# Patient Record
Sex: Female | Born: 1965 | Race: White | Hispanic: No | Marital: Married | Smoking: Former smoker
Health system: Southern US, Community
[De-identification: ages and names within clinical notes are randomized; demographics above are authoritative.]

## PROBLEM LIST (undated history)

## (undated) ENCOUNTER — Emergency Department (HOSPITAL_BASED_OUTPATIENT_CLINIC_OR_DEPARTMENT_OTHER): Discharge status: Absent without leave | Payer: BC Managed Care – PPO | Source: Home / Self Care

## (undated) DIAGNOSIS — G9332 Myalgic encephalomyelitis/chronic fatigue syndrome: Secondary | ICD-10-CM

## (undated) DIAGNOSIS — I341 Nonrheumatic mitral (valve) prolapse: Secondary | ICD-10-CM

## (undated) DIAGNOSIS — M1990 Unspecified osteoarthritis, unspecified site: Secondary | ICD-10-CM

## (undated) DIAGNOSIS — K648 Other hemorrhoids: Secondary | ICD-10-CM

## (undated) DIAGNOSIS — D219 Benign neoplasm of connective and other soft tissue, unspecified: Secondary | ICD-10-CM

## (undated) DIAGNOSIS — M2550 Pain in unspecified joint: Secondary | ICD-10-CM

## (undated) DIAGNOSIS — S12500A Unspecified displaced fracture of sixth cervical vertebra, initial encounter for closed fracture: Secondary | ICD-10-CM

## (undated) DIAGNOSIS — S064X9A Epidural hemorrhage with loss of consciousness of unspecified duration, initial encounter: Secondary | ICD-10-CM

## (undated) DIAGNOSIS — N809 Endometriosis, unspecified: Secondary | ICD-10-CM

## (undated) DIAGNOSIS — E538 Deficiency of other specified B group vitamins: Secondary | ICD-10-CM

## (undated) DIAGNOSIS — S22000A Wedge compression fracture of unspecified thoracic vertebra, initial encounter for closed fracture: Secondary | ICD-10-CM

## (undated) DIAGNOSIS — K219 Gastro-esophageal reflux disease without esophagitis: Secondary | ICD-10-CM

## (undated) DIAGNOSIS — L719 Rosacea, unspecified: Secondary | ICD-10-CM

## (undated) DIAGNOSIS — N890 Mild vaginal dysplasia: Secondary | ICD-10-CM

## (undated) DIAGNOSIS — Z8742 Personal history of other diseases of the female genital tract: Secondary | ICD-10-CM

## (undated) DIAGNOSIS — C44602 Unspecified malignant neoplasm of skin of right upper limb, including shoulder: Secondary | ICD-10-CM

## (undated) DIAGNOSIS — R5382 Chronic fatigue, unspecified: Secondary | ICD-10-CM

## (undated) DIAGNOSIS — R011 Cardiac murmur, unspecified: Secondary | ICD-10-CM

## (undated) DIAGNOSIS — L500 Allergic urticaria: Secondary | ICD-10-CM

## (undated) DIAGNOSIS — K589 Irritable bowel syndrome without diarrhea: Secondary | ICD-10-CM

## (undated) DIAGNOSIS — M797 Fibromyalgia: Secondary | ICD-10-CM

## (undated) DIAGNOSIS — I4891 Unspecified atrial fibrillation: Secondary | ICD-10-CM

## (undated) DIAGNOSIS — K2970 Gastritis, unspecified, without bleeding: Secondary | ICD-10-CM

## (undated) HISTORY — DX: Gastritis, unspecified, without bleeding: K29.70

## (undated) HISTORY — PX: ABDOMINAL HYSTERECTOMY: SHX81

## (undated) HISTORY — DX: Allergic urticaria: L50.0

## (undated) HISTORY — DX: Personal history of other diseases of the female genital tract: Z87.42

## (undated) HISTORY — DX: Fibromyalgia: M79.7

## (undated) HISTORY — DX: Nonrheumatic mitral (valve) prolapse: I34.1

## (undated) HISTORY — DX: Wedge compression fracture of unspecified thoracic vertebra, initial encounter for closed fracture: S22.000A

## (undated) HISTORY — PX: OVARIAN CYST REMOVAL: SHX89

## (undated) HISTORY — DX: Unspecified malignant neoplasm of skin of right upper limb, including shoulder: C44.602

## (undated) HISTORY — DX: Gastro-esophageal reflux disease without esophagitis: K21.9

## (undated) HISTORY — DX: Rosacea, unspecified: L71.9

## (undated) HISTORY — DX: Endometriosis, unspecified: N80.9

## (undated) HISTORY — DX: Pain in unspecified joint: M25.50

## (undated) HISTORY — DX: Myalgic encephalomyelitis/chronic fatigue syndrome: G93.32

## (undated) HISTORY — DX: Unspecified displaced fracture of sixth cervical vertebra, initial encounter for closed fracture: S12.500A

## (undated) HISTORY — PX: UTERINE FIBROID SURGERY: SHX826

## (undated) HISTORY — DX: Cardiac murmur, unspecified: R01.1

## (undated) HISTORY — DX: Mild vaginal dysplasia: N89.0

## (undated) HISTORY — PX: ANAL FISSURE REPAIR: SHX2312

## (undated) HISTORY — PX: COLPOSCOPY: SHX161

## (undated) HISTORY — PX: OTHER SURGICAL HISTORY: SHX169

## (undated) HISTORY — PX: TUBAL LIGATION: SHX77

## (undated) HISTORY — DX: Other hemorrhoids: K64.8

## (undated) HISTORY — DX: Benign neoplasm of connective and other soft tissue, unspecified: D21.9

## (undated) HISTORY — PX: HEMORRHOID SURGERY: SHX153

## (undated) HISTORY — DX: Chronic fatigue, unspecified: R53.82

## (undated) HISTORY — PX: TONSILLECTOMY AND ADENOIDECTOMY: SUR1326

## (undated) HISTORY — DX: Epidural hemorrhage with loss of consciousness of unspecified duration, initial encounter: S06.4X9A

## (undated) HISTORY — DX: Deficiency of other specified B group vitamins: E53.8

## (undated) HISTORY — PX: APPENDECTOMY: SHX54

## (undated) HISTORY — DX: Unspecified osteoarthritis, unspecified site: M19.90

---

## 1998-12-30 ENCOUNTER — Ambulatory Visit (HOSPITAL_COMMUNITY): Admission: RE | Admit: 1998-12-30 | Discharge: 1998-12-30 | Payer: Self-pay | Admitting: Internal Medicine

## 1998-12-30 ENCOUNTER — Encounter: Payer: Self-pay | Admitting: Internal Medicine

## 1999-01-17 ENCOUNTER — Encounter: Admission: RE | Admit: 1999-01-17 | Discharge: 1999-02-11 | Payer: Self-pay | Admitting: Internal Medicine

## 1999-01-20 ENCOUNTER — Encounter: Payer: Self-pay | Admitting: Internal Medicine

## 1999-01-20 ENCOUNTER — Ambulatory Visit (HOSPITAL_COMMUNITY): Admission: RE | Admit: 1999-01-20 | Discharge: 1999-01-20 | Payer: Self-pay | Admitting: Internal Medicine

## 1999-04-02 ENCOUNTER — Emergency Department (HOSPITAL_COMMUNITY): Admission: EM | Admit: 1999-04-02 | Discharge: 1999-04-03 | Payer: Self-pay | Admitting: Emergency Medicine

## 1999-12-12 ENCOUNTER — Encounter: Payer: Self-pay | Admitting: Internal Medicine

## 1999-12-12 ENCOUNTER — Ambulatory Visit (HOSPITAL_COMMUNITY): Admission: RE | Admit: 1999-12-12 | Discharge: 1999-12-12 | Payer: Self-pay | Admitting: Internal Medicine

## 2000-06-29 ENCOUNTER — Encounter: Payer: Self-pay | Admitting: Obstetrics and Gynecology

## 2000-06-29 ENCOUNTER — Encounter: Admission: RE | Admit: 2000-06-29 | Discharge: 2000-06-29 | Payer: Self-pay | Admitting: Obstetrics and Gynecology

## 2000-09-15 ENCOUNTER — Ambulatory Visit (HOSPITAL_COMMUNITY): Admission: RE | Admit: 2000-09-15 | Discharge: 2000-09-15 | Payer: Self-pay | Admitting: Obstetrics and Gynecology

## 2000-09-15 ENCOUNTER — Encounter: Payer: Self-pay | Admitting: Obstetrics and Gynecology

## 2000-09-16 ENCOUNTER — Other Ambulatory Visit: Admission: RE | Admit: 2000-09-16 | Discharge: 2000-09-16 | Payer: Self-pay | Admitting: Internal Medicine

## 2000-09-16 ENCOUNTER — Encounter (INDEPENDENT_AMBULATORY_CARE_PROVIDER_SITE_OTHER): Payer: Self-pay | Admitting: Specialist

## 2001-06-20 ENCOUNTER — Encounter: Payer: Self-pay | Admitting: Family Medicine

## 2001-06-20 ENCOUNTER — Ambulatory Visit (HOSPITAL_COMMUNITY): Admission: RE | Admit: 2001-06-20 | Discharge: 2001-06-20 | Payer: Self-pay | Admitting: Family Medicine

## 2001-10-31 ENCOUNTER — Encounter: Admission: RE | Admit: 2001-10-31 | Discharge: 2002-01-29 | Payer: Self-pay | Admitting: Internal Medicine

## 2002-10-05 ENCOUNTER — Encounter: Payer: Self-pay | Admitting: Family Medicine

## 2002-10-05 ENCOUNTER — Ambulatory Visit (HOSPITAL_COMMUNITY): Admission: RE | Admit: 2002-10-05 | Discharge: 2002-10-05 | Payer: Self-pay | Admitting: Family Medicine

## 2003-02-20 ENCOUNTER — Other Ambulatory Visit: Admission: RE | Admit: 2003-02-20 | Discharge: 2003-02-20 | Payer: Self-pay | Admitting: Gynecology

## 2003-03-27 ENCOUNTER — Ambulatory Visit (HOSPITAL_COMMUNITY): Admission: RE | Admit: 2003-03-27 | Discharge: 2003-03-27 | Payer: Self-pay | Admitting: Family Medicine

## 2003-03-28 ENCOUNTER — Ambulatory Visit (HOSPITAL_COMMUNITY): Admission: RE | Admit: 2003-03-28 | Discharge: 2003-03-28 | Payer: Self-pay | Admitting: Family Medicine

## 2003-04-23 ENCOUNTER — Ambulatory Visit (HOSPITAL_COMMUNITY): Admission: RE | Admit: 2003-04-23 | Discharge: 2003-04-23 | Payer: Self-pay | Admitting: Internal Medicine

## 2003-10-11 ENCOUNTER — Encounter (INDEPENDENT_AMBULATORY_CARE_PROVIDER_SITE_OTHER): Payer: Self-pay | Admitting: Specialist

## 2003-10-11 ENCOUNTER — Observation Stay (HOSPITAL_COMMUNITY): Admission: RE | Admit: 2003-10-11 | Discharge: 2003-10-12 | Payer: Self-pay | Admitting: Gynecology

## 2003-10-17 ENCOUNTER — Emergency Department (HOSPITAL_COMMUNITY): Admission: EM | Admit: 2003-10-17 | Discharge: 2003-10-18 | Payer: Self-pay | Admitting: Emergency Medicine

## 2004-04-21 ENCOUNTER — Ambulatory Visit: Payer: Self-pay | Admitting: Internal Medicine

## 2005-05-21 ENCOUNTER — Ambulatory Visit: Payer: Self-pay | Admitting: Internal Medicine

## 2005-07-07 ENCOUNTER — Other Ambulatory Visit: Admission: RE | Admit: 2005-07-07 | Discharge: 2005-07-07 | Payer: Self-pay | Admitting: Family Medicine

## 2006-04-21 ENCOUNTER — Ambulatory Visit: Payer: Self-pay | Admitting: Internal Medicine

## 2006-04-21 LAB — CONVERTED CEMR LAB
ALT: 10 units/L (ref 0–40)
AST: 16 units/L (ref 0–37)
Albumin: 3.8 g/dL (ref 3.5–5.2)
BUN: 11 mg/dL (ref 6–23)
Basophils Absolute: 0 10*3/uL (ref 0.0–0.1)
CO2: 30 meq/L (ref 19–32)
Chloride: 102 meq/L (ref 96–112)
Creatinine, Ser: 0.6 mg/dL (ref 0.4–1.2)
Eosinophils Relative: 0.1 % (ref 0.0–5.0)
GFR calc Af Amer: 142 mL/min
GFR calc non Af Amer: 118 mL/min
HCT: 38.7 % (ref 36.0–46.0)
Ketones, ur: NEGATIVE mg/dL
MCHC: 34.7 g/dL (ref 30.0–36.0)
MCV: 86.7 fL (ref 78.0–100.0)
Monocytes Absolute: 0.6 10*3/uL (ref 0.2–0.7)
Monocytes Relative: 8.2 % (ref 3.0–11.0)
Potassium: 3.9 meq/L (ref 3.5–5.1)
RBC: 4.47 M/uL (ref 3.87–5.11)
RDW: 11.9 % (ref 11.5–14.6)
Sed Rate: 16 mm/hr (ref 0–25)
Sodium: 136 meq/L (ref 135–145)
Specific Gravity, Urine: 1.01 (ref 1.000–1.03)
Urobilinogen, UA: 0.2 (ref 0.0–1.0)
WBC: 7.7 10*3/uL (ref 4.5–10.5)
pH: 7.5 (ref 5.0–8.0)

## 2006-11-16 ENCOUNTER — Other Ambulatory Visit: Admission: RE | Admit: 2006-11-16 | Discharge: 2006-11-16 | Payer: Self-pay | Admitting: Family Medicine

## 2007-11-04 ENCOUNTER — Other Ambulatory Visit: Admission: RE | Admit: 2007-11-04 | Discharge: 2007-11-04 | Payer: Self-pay | Admitting: Gynecology

## 2008-01-02 ENCOUNTER — Ambulatory Visit: Payer: Self-pay | Admitting: Internal Medicine

## 2008-01-02 DIAGNOSIS — J019 Acute sinusitis, unspecified: Secondary | ICD-10-CM

## 2008-01-02 DIAGNOSIS — E538 Deficiency of other specified B group vitamins: Secondary | ICD-10-CM

## 2008-01-02 DIAGNOSIS — L049 Acute lymphadenitis, unspecified: Secondary | ICD-10-CM

## 2008-01-02 DIAGNOSIS — Z8709 Personal history of other diseases of the respiratory system: Secondary | ICD-10-CM | POA: Insufficient documentation

## 2008-01-03 LAB — CONVERTED CEMR LAB
Eosinophils Absolute: 0 10*3/uL (ref 0.0–0.7)
Hemoglobin: 12.9 g/dL (ref 12.0–15.0)
MCV: 90.4 fL (ref 78.0–100.0)
Monocytes Absolute: 0.5 10*3/uL (ref 0.1–1.0)
Neutrophils Relative %: 64.9 % (ref 43.0–77.0)
Platelets: 325 10*3/uL (ref 150–400)
RBC: 4.19 M/uL (ref 3.87–5.11)
TSH: 0.95 microintl units/mL (ref 0.35–5.50)
Vitamin B-12: 498 pg/mL (ref 211–911)

## 2008-01-18 ENCOUNTER — Encounter: Admission: RE | Admit: 2008-01-18 | Discharge: 2008-01-18 | Payer: Self-pay | Admitting: Dermatology

## 2008-01-20 ENCOUNTER — Encounter: Payer: Self-pay | Admitting: Internal Medicine

## 2008-02-27 ENCOUNTER — Encounter: Admission: RE | Admit: 2008-02-27 | Discharge: 2008-02-27 | Payer: Self-pay | Admitting: Gynecology

## 2008-04-12 ENCOUNTER — Ambulatory Visit (HOSPITAL_BASED_OUTPATIENT_CLINIC_OR_DEPARTMENT_OTHER): Admission: RE | Admit: 2008-04-12 | Discharge: 2008-04-12 | Payer: Self-pay | Admitting: General Surgery

## 2008-04-12 ENCOUNTER — Encounter (INDEPENDENT_AMBULATORY_CARE_PROVIDER_SITE_OTHER): Payer: Self-pay | Admitting: General Surgery

## 2008-12-27 ENCOUNTER — Ambulatory Visit: Payer: Self-pay | Admitting: Internal Medicine

## 2008-12-27 DIAGNOSIS — L299 Pruritus, unspecified: Secondary | ICD-10-CM | POA: Insufficient documentation

## 2008-12-27 DIAGNOSIS — L500 Allergic urticaria: Secondary | ICD-10-CM | POA: Insufficient documentation

## 2008-12-27 DIAGNOSIS — L719 Rosacea, unspecified: Secondary | ICD-10-CM

## 2008-12-28 LAB — CONVERTED CEMR LAB
Cortisol, Plasma: 7.5 ug/dL
Hemoglobin, Urine: NEGATIVE
Ketones, ur: NEGATIVE mg/dL
Nitrite: NEGATIVE
Specific Gravity, Urine: <=1.005 (ref 1.000–1.030)
Total Protein, Urine: NEGATIVE mg/dL
Urine Glucose: NEGATIVE mg/dL
Urobilinogen, UA: 0.2 (ref 0.0–1.0)
pH: 7 (ref 5.0–8.0)

## 2009-02-20 ENCOUNTER — Ambulatory Visit: Payer: Self-pay | Admitting: Internal Medicine

## 2009-02-20 DIAGNOSIS — M545 Low back pain, unspecified: Secondary | ICD-10-CM | POA: Insufficient documentation

## 2009-02-20 DIAGNOSIS — R109 Unspecified abdominal pain: Secondary | ICD-10-CM | POA: Insufficient documentation

## 2009-02-20 LAB — CONVERTED CEMR LAB
Bilirubin Urine: NEGATIVE
Hemoglobin, Urine: NEGATIVE
Leukocytes, UA: NEGATIVE
Specific Gravity, Urine: <=1.005 (ref 1.000–1.030)

## 2009-06-26 ENCOUNTER — Ambulatory Visit: Payer: Self-pay | Admitting: Internal Medicine

## 2009-06-26 DIAGNOSIS — Z87891 Personal history of nicotine dependence: Secondary | ICD-10-CM | POA: Insufficient documentation

## 2009-06-26 DIAGNOSIS — R51 Headache: Secondary | ICD-10-CM

## 2009-06-26 DIAGNOSIS — M2550 Pain in unspecified joint: Secondary | ICD-10-CM | POA: Insufficient documentation

## 2009-06-26 DIAGNOSIS — R519 Headache, unspecified: Secondary | ICD-10-CM | POA: Insufficient documentation

## 2009-06-26 LAB — CONVERTED CEMR LAB
Anti Nuclear Antibody(ANA): POSITIVE — AB
Vit D, 25-Hydroxy: 27 ng/mL — ABNORMAL LOW (ref 30–89)

## 2009-06-27 LAB — CONVERTED CEMR LAB
ALT: 12 units/L (ref 0–35)
Alkaline Phosphatase: 46 units/L (ref 39–117)
Basophils Absolute: 0 10*3/uL (ref 0.0–0.1)
Basophils Relative: 0.2 % (ref 0.0–3.0)
Chloride: 101 meq/L (ref 96–112)
Cortisol, Plasma: 16.8 ug/dL
Free T4: 0.8 ng/dL (ref 0.6–1.6)
GFR calc non Af Amer: 115.33 mL/min (ref >60)
Glucose, Bld: 102 mg/dL — ABNORMAL HIGH (ref 70–99)
HCT: 40.2 % (ref 36.0–46.0)
Hemoglobin, Urine: NEGATIVE
Hemoglobin: 13 g/dL (ref 12.0–15.0)
Lymphocytes Relative: 25.9 % (ref 12.0–46.0)
MCHC: 32.3 g/dL (ref 30.0–36.0)
MCV: 90.9 fL (ref 78.0–100.0)
Monocytes Relative: 7.6 % (ref 3.0–12.0)
Neutrophils Relative %: 66.2 % (ref 43.0–77.0)
Nitrite: NEGATIVE
Platelets: 271 10*3/uL (ref 150.0–400.0)
RDW: 12.3 % (ref 11.5–14.6)
Rhuematoid fact SerPl-aCnc: 23.3 intl units/mL — ABNORMAL HIGH (ref 0.0–20.0)
Sed Rate: 16 mm/hr (ref 0–22)
Specific Gravity, Urine: 1.01 (ref 1.000–1.030)
Total Bilirubin: 0.5 mg/dL (ref 0.3–1.2)
Total CK: 51 units/L (ref 7–177)
Total Protein: 7.8 g/dL (ref 6.0–8.3)
Urine Glucose: 100 mg/dL
Urobilinogen, UA: 0.2 (ref 0.0–1.0)
WBC: 8.6 10*3/uL (ref 4.5–10.5)

## 2009-07-08 ENCOUNTER — Telehealth: Payer: Self-pay | Admitting: Internal Medicine

## 2009-07-18 ENCOUNTER — Encounter: Payer: Self-pay | Admitting: Internal Medicine

## 2009-07-26 ENCOUNTER — Ambulatory Visit: Payer: Self-pay | Admitting: Internal Medicine

## 2009-08-09 ENCOUNTER — Encounter (INDEPENDENT_AMBULATORY_CARE_PROVIDER_SITE_OTHER): Payer: Self-pay | Admitting: *Deleted

## 2009-08-23 ENCOUNTER — Ambulatory Visit: Payer: Self-pay | Admitting: Internal Medicine

## 2009-10-01 ENCOUNTER — Encounter: Payer: Self-pay | Admitting: Internal Medicine

## 2009-11-15 ENCOUNTER — Ambulatory Visit: Payer: Self-pay | Admitting: Internal Medicine

## 2009-11-15 LAB — CONVERTED CEMR LAB
Albumin: 3.9 g/dL (ref 3.5–5.2)
BUN: 13 mg/dL (ref 6–23)
Bilirubin, Direct: 0.1 mg/dL (ref 0.0–0.3)
Calcium: 9 mg/dL (ref 8.4–10.5)
Chloride: 106 meq/L (ref 96–112)
GFR calc non Af Amer: 110.85 mL/min (ref >60)
Potassium: 4.1 meq/L (ref 3.5–5.1)
TSH: 1.05 microintl units/mL (ref 0.35–5.50)
Total Protein: 6.7 g/dL (ref 6.0–8.3)

## 2009-11-20 ENCOUNTER — Ambulatory Visit: Payer: Self-pay | Admitting: Internal Medicine

## 2009-11-20 DIAGNOSIS — R5383 Other fatigue: Secondary | ICD-10-CM

## 2009-11-20 DIAGNOSIS — IMO0001 Reserved for inherently not codable concepts without codable children: Secondary | ICD-10-CM | POA: Insufficient documentation

## 2010-03-10 ENCOUNTER — Telehealth: Payer: Self-pay | Admitting: Internal Medicine

## 2010-03-13 ENCOUNTER — Telehealth: Payer: Self-pay | Admitting: Internal Medicine

## 2010-03-14 ENCOUNTER — Ambulatory Visit: Payer: Self-pay | Admitting: Internal Medicine

## 2010-03-18 LAB — CONVERTED CEMR LAB
ALT: 13 units/L (ref 0–35)
AST: 15 units/L (ref 0–37)
Albumin: 3.9 g/dL (ref 3.5–5.2)
Alkaline Phosphatase: 43 units/L (ref 39–117)
Basophils Relative: 0.3 % (ref 0.0–3.0)
Bilirubin Urine: NEGATIVE
Bilirubin, Direct: 0.1 mg/dL (ref 0.0–0.3)
Chloride: 104 meq/L (ref 96–112)
Cholesterol: 174 mg/dL (ref 0–200)
Eosinophils Absolute: 0 10*3/uL (ref 0.0–0.7)
Eosinophils Relative: 0.2 % (ref 0.0–5.0)
Glucose, Bld: 83 mg/dL (ref 70–99)
HCT: 36.6 % (ref 36.0–46.0)
Hemoglobin: 12.8 g/dL (ref 12.0–15.0)
LDL Cholesterol: 110 mg/dL — ABNORMAL HIGH (ref 0–99)
Neutro Abs: 3.3 10*3/uL (ref 1.4–7.7)
Neutrophils Relative %: 60.4 % (ref 43.0–77.0)
Nitrite: NEGATIVE
Platelets: 298 10*3/uL (ref 150.0–400.0)
Potassium: 3.8 meq/L (ref 3.5–5.1)
RBC: 4.13 M/uL (ref 3.87–5.11)
Sodium: 139 meq/L (ref 135–145)
TSH: 1.42 microintl units/mL (ref 0.35–5.50)
Total CHOL/HDL Ratio: 3
Triglycerides: 48 mg/dL (ref 0.0–149.0)
Vitamin B-12: 601 pg/mL (ref 211–911)
WBC: 5.5 10*3/uL (ref 4.5–10.5)
pH: 5.5 (ref 5.0–8.0)

## 2010-03-21 ENCOUNTER — Ambulatory Visit: Payer: Self-pay | Admitting: Internal Medicine

## 2010-04-29 NOTE — Assessment & Plan Note (Signed)
Summary: NOT FEELING BETTER/NWS   Vital Signs:  Patient profile:   45 year old female Height:      64 inches Weight:      146 pounds BMI:     25.15 O2 Sat:      98 % on Room air Temp:     98.3 degrees F oral Pulse rate:   73 / minute BP sitting:   106 / 68  (left arm) Cuff size:   regular  Vitals Entered By: Bill Salinas CMA (July 26, 2009 9:32 AM)  O2 Flow:  Room air CC: pt here for follow up office visit after seeing rheumotologist. Pt unsure of last tetanus immunization. pt states she is not taking Loratadine or singular at this time/ ab   CC:  pt here for follow up office visit after seeing rheumotologist. Pt unsure of last tetanus immunization. pt states she is not taking Loratadine or singular at this time/ ab.  History of Present Illness: C/o fatigue, attention span difficulties and mixing words C/o mouth sores, fatigues, joint aches  Current Medications (verified): 1)  Vitamin B-12 Cr 1000 Mcg  Tbcr (Cyanocobalamin) .... Take One Tablet By Mouth Daily 2)  Vitamin D3 1000 Unit  Tabs (Cholecalciferol) .Marland Kitchen.. 1 By Mouth Daily 3)  Metronidazole 0.75 % Gel (Metronidazole) .... Use Qd 4)  Loratadine 10 Mg  Tabs (Loratadine) .... Once Daily As Needed Allergies 5)  Singulair 10 Mg Tabs (Montelukast Sodium) .Marland Kitchen.. 1 By Mouth Daily 6)  Aspirin 325 Mg Tabs (Aspirin) .Marland Kitchen.. 1 By Mouth Once Daily Pc  Allergies (verified): 1)  ! Sulfacetamide Sodium (Sulfacetamide Sodium (Acne)) 2)  ! Morphine Sulfate (Morphine Sulfate) 3)  ! 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  The patient denies weight loss, weight gain, vision loss, chest pain, syncope, difficulty walking, depression, abnormal bleeding, and enlarged lymph nodes.    Physical Exam  General:  In no acute distress  Eyes:  No corneal or conjunctival inflammation noted. EOMI. Perrla.  Ears:  External ear exam shows no significant lesions or deformities.  Otoscopic examination reveals clear canals, tympanic membranes are intact bilaterally without bulging, retraction, inflammation or discharge. Hearing is grossly normal bilaterally. Nose:  External nasal examination shows no deformity or inflammation. Nasal mucosa are pink and moist without lesions or exudates. Mouth:  Oral mucosa and oropharynx without lesions or exudates.  Teeth in good repair. Lungs:  Normal respiratory effort, chest expands symmetrically. Lungs are clear to auscultation, no crackles or wheezes. Heart:  Normal rate and regular rhythm. S1 and S2 normal without gallop, murmur, click, rub or other extra sounds. Abdomen:  Bowel sounds positive,abdomen soft and non-tender without masses, organomegaly or hernias noted. Sensitive in lower 1/2 R>L Msk:  No deformity or scoliosis noted of thoracic or lumbar spine.   Pulses:  R and L carotid,radial,femoral,dorsalis pedis and posterior tibial pulses are full and equal bilaterally Extremities:  No clubbing, cyanosis, edema, or deformity noted with normal full range of motion of all joints.   Neurologic:  No cranial nerve deficits noted.  Station and gait are normal. Plantar reflexes are down-going bilaterally. DTRs are symmetrical throughout. Sensory, motor and coordinative functions appear intact. Skin:  Rosacea on face Psych:   normally interactive and good eye contact.  not anxious appearing and not depressed appearing.     Impression & Recommendations:  Problem # 1:  ARTHRALGIA (ICD-719.40) Assessment  Unchanged S/p Rhem consult Possible undefined CTD is present Trial of Naproxen  Problem # 2:  OTHER SPEECH DISTURBANCE (ICD-784.59) -" loosing words" Assessment: New R/o demyelinating disease Orders: Neurology Referral (Neuro) Dr Dohmeier  Problem # 3:  ROSACEA (ICD-695.3) Assessment: Unchanged On prescription drug  therapy   Problem # 4:  B12 DEFICIENCY (ICD-266.2) Assessment: Unchanged On prescription drug  therapy   Complete Medication List: 1)  Vitamin B-12 Cr 1000 Mcg Tbcr (Cyanocobalamin) .... Take one tablet by mouth daily 2)  Vitamin D3 1000 Unit Tabs (Cholecalciferol) .Marland Kitchen.. 1 by mouth daily 3)  Metronidazole 0.75 % Gel (Metronidazole) .... Use qd 4)  Loratadine 10 Mg Tabs (Loratadine) .... Once daily as needed allergies 5)  Singulair 10 Mg Tabs (Montelukast sodium) .Marland Kitchen.. 1 by mouth daily 6)  Naproxen 500 Mg Tabs (Naproxen) .Marland Kitchen.. 1 by mouth two times a day pc 7)  Vitamin D (ergocalciferol) 50000 Unit Caps (Ergocalciferol) .Marland Kitchen.. 1 by mouth q 1 week x 3 weeks then start vit d 1000 international units qd  Patient Instructions: 1)  Please schedule a follow-up appointment in 2 months. 2)  CBC, ANA, ESR, LFT, BMET, RF 995.20 3)  Take time off! Prescriptions: VITAMIN D (ERGOCALCIFEROL) 50000 UNIT CAPS (ERGOCALCIFEROL) 1 by mouth q 1 week x 3 weeks then start Vit D 1000 international units qd  #3 x 0   Entered and Authorized by:   Tresa Garter MD   Signed by:   Tresa Garter MD on 07/26/2009   Method used:   Print then Give to Patient   RxID:   [id number] NAPROXEN 500 MG TABS (NAPROXEN) 1 by mouth two times a day pc  #60 x 3   Entered and Authorized by:   Tresa Garter MD   Signed by:   Tresa Garter MD on 07/26/2009   Method used:   Print then Give to Patient   RxID:   [id number]

## 2010-04-29 NOTE — Assessment & Plan Note (Signed)
Summary: FU/ NOT FEELING WELL /NWS  #   Vital Signs:  Patient profile:   45 year old female Height:      64 inches Weight:      138 pounds BMI:     23.77 O2 Sat:      97 % on Room air Temp:     99.1 degrees F oral Pulse rate:   81 / minute Pulse rhythm:   regular Resp:     16 per minute BP sitting:   120 / 70  (left arm) Cuff size:   regular  Vitals Entered By: Lanier Prude, CMA(AAMA) (November 20, 2009 1:46 PM)  O2 Flow:  Room air CC: f/u c/o fatigue Is Patient Diabetic? No Comments pt states she is not taking any meds except for all the Vitamins   CC:  f/u c/o fatigue.  History of Present Illness: The patient presents for a follow up of fatigue, anxiety, depression and headaches aches and pains. C/o stress with her ex.   Current Medications (verified): 1)  Vitamin B-12 Cr 1000 Mcg  Tbcr (Cyanocobalamin) .... Take One Tablet By Mouth Daily 2)  Vitamin D3 1000 Unit  Tabs (Cholecalciferol) .Marland Kitchen.. 1 By Mouth Daily 3)  Metronidazole 0.75 % Gel (Metronidazole) .... Use Qd 4)  Loratadine 10 Mg  Tabs (Loratadine) .... Once Daily As Needed Allergies 5)  Singulair 10 Mg Tabs (Montelukast Sodium) .Marland Kitchen.. 1 By Mouth Daily 6)  Naproxen 500 Mg Tabs (Naproxen) .Marland Kitchen.. 1 By Mouth Two Times A Day Pc 7)  Vitamin D (Ergocalciferol) 50000 Unit Caps (Ergocalciferol) .Marland Kitchen.. 1 By Mouth Q 1 Week X 3 Weeks Then Start Vit D 1000 International Units Qd  Allergies (verified): 1)  ! Sulfacetamide Sodium (Sulfacetamide Sodium (Acne)) 2)  ! Morphine Sulfate (Morphine Sulfate) 3)  ! 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  The patient denies melena.         stressed out; tired  Physical Exam  General:  In no acute distress  Eyes:  No corneal or conjunctival inflammation noted. EOMI. Perrla.  Nose:  External nasal examination shows no deformity or inflammation. Nasal mucosa are pink and moist without lesions or exudates. Mouth:  Oral mucosa and oropharynx without lesions or exudates.  Teeth in good repair. Neck:  No deformities, masses, or tenderness noted. Lungs:  Normal respiratory effort, chest expands symmetrically. Lungs are clear to auscultation, no crackles or wheezes. Heart:  Normal rate and regular rhythm. S1 and S2 normal without gallop, murmur, click, rub or other extra sounds. Abdomen:  Bowel sounds positive,abdomen soft and non-tender without masses, organomegaly or hernias noted. Sensitive in lower 1/2 R>L Msk:  No deformity or scoliosis noted of thoracic or lumbar spine.   Extremities:  No clubbing, cyanosis, edema, or deformity noted with normal full range of motion of all joints.   Neurologic:  No cranial nerve deficits noted. Station and gait are normal. Plantar reflexes are down-going bilaterally. DTRs are symmetrical throughout. Sensory, motor and coordinative functions appear intact. Skin:  Rosacea on face Psych:   normally interactive and  good eye contact.  A little anxious appearing and depressed appearing.  not suicidal and not homicidal.     Impression & Recommendations:  Problem # 1:  FIBROMYALGIA (ICD-729.1) Assessment Unchanged  Her updated medication list for this problem includes:    Naproxen 500 Mg Tabs (Naproxen) .Marland Kitchen... 1 by mouth two times a day pc  Problem # 2:  ARTHRALGIA (ICD-719.40) Assessment: Unchanged Due to #1  Problem # 3:  B12 DEFICIENCY (ICD-266.2) Assessment: Comment Only On the regimen of medicine(s) reflected in the chart    Problem # 4:  ALLERGIC URTICARIA  (ICD-708.0) Assessment: Improved On the regimen of medicine(s) reflected in the chart    Problem # 5:  FATIGUE (ICD-780.79) Assessment: Unchanged Poss CFS ??CTD  Complete Medication List: 1)  Vitamin B-12 Cr 1000 Mcg Tbcr (Cyanocobalamin) .... Take one tablet by mouth daily 2)  Vitamin D3 1000 Unit Tabs (Cholecalciferol) .Marland Kitchen.. 1 by mouth daily 3)  Metronidazole 0.75 % Gel (Metronidazole) .... Use qd 4)  Loratadine 10 Mg Tabs (Loratadine) .... Once daily as needed allergies 5)  Singulair 10 Mg Tabs (Montelukast sodium) .Marland Kitchen.. 1 by mouth daily 6)  Naproxen 500 Mg Tabs (Naproxen) .Marland Kitchen.. 1 by mouth two times a day pc 7)  Vitamin D (ergocalciferol) 50000 Unit Caps (Ergocalciferol) .Marland Kitchen.. 1 by mouth q 1 week x 3 weeks then start vit d 1000 international units qd  Contraindications/Deferment of Procedures/Staging:    Test/Procedure: FLU VAX    Reason for deferment: patient declined   Patient Instructions: 1)  Please schedule a follow-up appointment in 4 months well w/labs and vit b12 266.20.

## 2010-04-29 NOTE — Assessment & Plan Note (Signed)
Summary: 2 MO ROV/NWS   Vital Signs:  Patient profile:   45 year old female Height:      64 inches (162.56 cm) Weight:      141.50 pounds (64.32 kg) O2 Sat:      97 % on Room air Temp:     98.7 degrees F (37.06 degrees C) oral Pulse rate:   75 / minute BP sitting:   108 / 70  (left arm) Cuff size:   regular  Vitals Entered By: Josph Macho RMA (Aug 23, 2009 7:55 AM)  O2 Flow:  Room air CC: Follow-up visit/ pt states she is no longer taking Loratadine or Singulair/ CF Is Patient Diabetic? No   CC:  Follow-up visit/ pt states she is no longer taking Loratadine or Singulair/ CF.  History of Present Illness: F/u speech problems, fatigue, arthralgia  Current Medications (verified): 1)  Vitamin B-12 Cr 1000 Mcg  Tbcr (Cyanocobalamin) .... Take One Tablet By Mouth Daily 2)  Vitamin D3 1000 Unit  Tabs (Cholecalciferol) .Marland Kitchen.. 1 By Mouth Daily 3)  Metronidazole 0.75 % Gel (Metronidazole) .... Use Qd 4)  Loratadine 10 Mg  Tabs (Loratadine) .... Once Daily As Needed Allergies 5)  Singulair 10 Mg Tabs (Montelukast Sodium) .Marland Kitchen.. 1 By Mouth Daily 6)  Naproxen 500 Mg Tabs (Naproxen) .Marland Kitchen.. 1 By Mouth Two Times A Day Pc 7)  Vitamin D (Ergocalciferol) 50000 Unit Caps (Ergocalciferol) .Marland Kitchen.. 1 By Mouth Q 1 Week X 3 Weeks Then Start Vit D 1000 International Units Qd  Allergies (verified): 1)  ! Sulfacetamide Sodium (Sulfacetamide Sodium (Acne)) 2)  ! Morphine Sulfate (Morphine Sulfate) 3)  ! 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         IBS, arthralgias  Physical Exam  General:  In no acute distress  Nose:  External nasal examination shows no deformity or inflammation. Nasal mucosa are pink and moist without lesions or exudates. Mouth:  Oral mucosa and oropharynx without lesions or exudates.  Teeth in good repair. Lungs:  Normal respiratory effort, chest expands symmetrically. Lungs are clear to auscultation, no crackles or wheezes. Heart:  Normal rate and regular rhythm. S1 and S2 normal without gallop, murmur, click, rub or other extra sounds. Abdomen:  Bowel sounds positive,abdomen soft and non-tender without masses, organomegaly or hernias noted. Sensitive in lower 1/2 R>L Msk:  No deformity or scoliosis noted of thoracic or lumbar spine.   Extremities:  No clubbing, cyanosis, edema, or deformity noted with normal full range of motion of all joints.   Neurologic:  No cranial nerve deficits noted. Station and gait are normal. Plantar reflexes are down-going bilaterally. DTRs are symmetrical throughout. Sensory, motor and coordinative functions appear intact. Skin:  Rosacea on face Psych:   normally interactive and good eye contact.  not anxious appearing and not depressed appearing.     Impression & Recommendations:  Problem # 1:  ARTHRALGIA (ICD-719.40) Assessment Improved Better on Naproxen. Gastritis risk was discussed.  Problem # 2:  OTHER SPEECH DISTURBANCE (ICD-784.59) ? etiology Assessment: Unchanged Neurol cons is  pending   Problem # 3:  HEADACHE (ICD-784.0) Assessment: Improved  Her updated medication list for this problem includes:    Naproxen 500 Mg Tabs (Naproxen) .Marland Kitchen... 1 by mouth two times a day pc  Problem # 4:  B12 DEFICIENCY (ICD-266.2) Assessment: Improved On prescription drug  therapy   Complete Medication List: 1)  Vitamin B-12 Cr 1000 Mcg Tbcr (Cyanocobalamin) .... Take one tablet by mouth daily 2)   Vitamin D3 1000 Unit Tabs (Cholecalciferol) .Marland Kitchen.. 1 by mouth daily 3)  Metronidazole 0.75 % Gel (Metronidazole) .... Use qd 4)  Loratadine 10 Mg Tabs (Loratadine) .... Once daily as needed allergies 5)  Singulair 10 Mg Tabs (Montelukast sodium) .Marland Kitchen.. 1 by mouth daily 6)  Naproxen 500 Mg Tabs (Naproxen) .Marland Kitchen.. 1 by mouth two times a day pc 7)  Vitamin D (ergocalciferol) 50000 Unit Caps (Ergocalciferol) .Marland Kitchen.. 1 by mouth q 1 week x 3 weeks then start vit d 1000 international units qd   Patient Instructions: 1)  Please schedule a follow-up appointment in 4 months. 2)  Call if you are  worse.  3)  BMP prior to visit, ICD-9: 4)  Hepatic Panel prior to visit, ICD-9: 5)  TSH prior to visit, ICD-9: 6)  Vit D 268.9  780.79

## 2010-04-29 NOTE — Letter (Signed)
Summary: [facility]  [facility]   Imported By: Lester Rome 10/17/2009 09:51:36  _____________________________________________________________________  External Attachment:    Type:   Image     Comment:   External Document

## 2010-04-29 NOTE — Assessment & Plan Note (Signed)
Summary: MAY NEED THYROID CK'D PER EYE DR/NWS  #   Vital Signs:  Patient profile:   45 year old female Weight:      141 pounds Temp:     98.1 degrees F oral Pulse rate:   77 / minute BP sitting:   90 / 60  (left arm)  Vitals Entered By: Tora Perches (June 26, 2009 7:50 AM) CC: f/u on thyroid Is Patient Diabetic? No   CC:  f/u on thyroid.  History of Present Illness: C/[ HAs and light pressure behind the eyes. An eye Dr said L eye was bulging more and that she needs her thyroid checked. C/o achy joints, abd pains, low grade fever.  Preventive Screening-Counseling & Management  Alcohol-Tobacco     Smoking Status: quit  Current Medications (verified): 1)  Vitamin B-12 Cr 1000 Mcg  Tbcr (Cyanocobalamin) .... Take One Tablet By Mouth Daily 2)  Vitamin D3 1000 Unit  Tabs (Cholecalciferol) .Marland Kitchen.. 1 By Mouth Daily 3)  Metronidazole 0.75 % Gel (Metronidazole) .... Use Qd 4)  Loratadine 10 Mg  Tabs (Loratadine) .... Once Daily As Needed Allergies 5)  Singulair 10 Mg Tabs (Montelukast Sodium) .Marland Kitchen.. 1 By Mouth Daily  Allergies: 1)  ! Sulfacetamide Sodium (Sulfacetamide Sodium (Acne)) 2)  ! Morphine Sulfate (Morphine Sulfate) 3)  ! Indocin (Indomethacin) 4)  ! 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         Tired at times  Physical Exam  General:  In no acute distress  Nose:  External nasal examination shows no deformity or inflammation. Nasal  mucosa are pink and moist without lesions or exudates. Mouth:  Oral mucosa and oropharynx without lesions or exudates.  Teeth in good repair. Neck:  No deformities, masses, or tenderness noted. Lungs:  Normal respiratory effort, chest expands symmetrically. Lungs are clear to auscultation, no crackles or wheezes. Heart:  Normal rate and regular rhythm. S1 and S2 normal without gallop, murmur, click, rub or other extra sounds. Abdomen:  Bowel sounds positive,abdomen soft and non-tender without masses, organomegaly or hernias noted. Sensitive in lower 1/2 R>L Msk:  No deformity or scoliosis noted of thoracic or lumbar spine.   Extremities:  No clubbing, cyanosis, edema, or deformity noted with normal full range of motion of all joints.   Neurologic:  No cranial nerve deficits noted. Station and gait are normal. Plantar reflexes are down-going bilaterally. DTRs are symmetrical throughout. Sensory, motor and coordinative functions appear intact. Skin:  Rosacea on face Psych:   normally interactive and good eye contact.  not anxious appearing and not depressed appearing.     Impression & Recommendations:  Problem # 1:  ARTHRALGIA (ICD-719.40) Likely due to being overworked (7 d a week) and a suboptimal sleep. Possible FMS. Rest more! Orders: T-Vitamin D (25-Hydroxy) [id number]) T-ANA [id number]) TLB-B12, Serum-Total ONLY [phone]) TLB-BMP (Basic Metabolic Panel-BMET) (80048-METABOL) TLB-CBC Platelet - w/Differential (85025-CBCD) TLB-Hepatic/Liver Function Pnl (80076-HEPATIC) TLB-TSH (Thyroid Stimulating Hormone) (84443-TSH) TLB-T4 (Thyrox), Free [id number]) TLB-Sedimentation Rate (ESR) (85652-ESR) TLB-Udip ONLY (81003-UDIP) TLB-Cortisol (82533-CORT) TLB-Rheumatoid Factor (RA) (29528-UX) TLB-CK Total Only(Creatine  Kinase/CPK) (82550-CK) TLB-Cortisol (82533-CORT) TLB-Rheumatoid Factor (RA) (86431-RA) TLB-CK Total Only(Creatine Kinase/CPK) (82550-CK)  Problem # 2:  HEADACHE  (ICD-784.0) Assessment: Unchanged Related to  above. Orders: T-Vitamin D (25-Hydroxy) [phone]) T-ANA [phone]) T-Dehydroepiandrosterone (DHEA) [phone]) TLB-B12, Serum-Total ONLY [phone]) TLB-BMP (Basic Metabolic Panel-BMET) (80048-METABOL) TLB-CBC Platelet - w/Differential (85025-CBCD) TLB-Hepatic/Liver Function Pnl (80076-HEPATIC) TLB-TSH (Thyroid Stimulating Hormone) (84443-TSH) TLB-T4 (Thyrox), Free [phone]) TLB-Sedimentation Rate (ESR) (85652-ESR) TLB-Udip ONLY (81003-UDIP) TLB-Cortisol (82533-CORT) TLB-Rheumatoid Factor (RA) (86431-RA) TLB-CK Total Only(Creatine Kinase/CPK) (82550-CK)  Her updated medication list for this problem includes:    Aspirin 325 Mg Tabs (Aspirin) .Marland Kitchen... 1 by mouth once daily pc  Problem # 3:  B12 DEFICIENCY (ICD-266.2) Assessment: Improved  Orders: T-Vitamin D (25-Hydroxy) ([id number]) T-ANA [phone]) TLB-B12, Serum-Total ONLY (95638-V56) TLB-BMP (Basic Metabolic Panel-BMET) (80048-METABOL) TLB-CBC Platelet - w/Differential (85025-CBCD) TLB-Hepatic/Liver Function Pnl (80076-HEPATIC) TLB-TSH (Thyroid Stimulating Hormone) (84443-TSH) TLB-T4 (Thyrox), Free [id number]) TLB-Sedimentation Rate (ESR) (85652-ESR) TLB-Udip ONLY (81003-UDIP) TLB-Cortisol (82533-CORT) TLB-Rheumatoid Factor (RA) (86431-RA) TLB-CK Total Only(Creatine Kinase/CPK) (82550-CK)  Problem # 4:  ROSACEA (ICD-695.3) Assessment: Improved  Problem # 5:  Eye issue per her ophth. TSH ordered  Complete Medication List: 1)  Vitamin B-12 Cr 1000 Mcg Tbcr (Cyanocobalamin) .... Take one tablet by mouth daily 2)  Vitamin D3 1000 Unit Tabs (Cholecalciferol) .Marland Kitchen.. 1 by mouth daily 3)  Metronidazole 0.75 % Gel (Metronidazole) .... Use qd 4)  Loratadine 10 Mg Tabs (Loratadine) .... Once daily as needed allergies 5)  Singulair 10 Mg Tabs (Montelukast sodium) .Marland Kitchen.. 1 by mouth daily 6)  Aspirin 325 Mg Tabs (Aspirin) .Marland Kitchen.. 1 by mouth once daily pc  Patient  Instructions: 1)  Valerian root for sleep 2)  Cut back on work 3)  Please schedule a follow-up appointment in 2 months. 4)  Contour pillow 5)  Start taking a  yoga class

## 2010-04-29 NOTE — Letter (Signed)
Summary: [facility] Consult Scheduled Letter  [facility]  [address] [place], [place] [address]   Phone: [phone]  Fax: [phone]      08/09/2009 MRN: [id number]  Regina [name] [address] [place], [place]  [address]    Dear [name],      We have scheduled an appointment for you.  At the recommendation of Dr.Plotnikov, we have scheduled you a consult with Dr Vickey Huger on 10/01/09 at 9:30am.  Their phone number is [phone].  If this appointment day and time is not convenient for you, please feel free to call the office of the doctor you are being referred to at the number listed above and reschedule the appointment.    [facility] [address], [address] [place], [place] [address]   *Please arrive 30 minutes prior to appointment time.*    Thank you,  Patient Care Coordinator  [facility]

## 2010-04-29 NOTE — Letter (Signed)
Summary: Generic Letter  [facility]  [address] [place], [place] [address]   Phone: [phone]  Fax: [phone]    11/20/2009  RE: Regina [name] [address] [place], [place]  [address]       Regina [name] is suffering from fibromyalgia and chronic fatigue ( we consulted with a rheumatologist and a neurologist in this regard). She is not able to work 80 hrs a week as she has been. She should work no more than 40 hrs a week due to medical reasons.           Sincerely,   Jacinta Shoe MD

## 2010-04-29 NOTE — Progress Notes (Signed)
Summary: REFERRAL  Phone Note Call from Patient Call back at Home Phone [phone]   Summary of Call: Patient is requesting referral to Rheumatologist. See append on labs. Please put in referral.  Initial call taken by: Lamar Sprinkles, CMA,  July 08, 2009 3:46 PM  Follow-up for Phone Call        Rheum appt Follow-up by: Tresa Garter MD,  July 09, 2009 2:34 PM

## 2010-04-29 NOTE — Consult Note (Signed)
Summary: [facility]   Imported By: Sherian Rein 08/09/2009 08:36:43  _____________________________________________________________________  External Attachment:    Type:   Image     Comment:   External Document

## 2010-05-01 NOTE — Progress Notes (Signed)
Summary: TETANUS  Phone Note Call from Patient   Summary of Call: Patient is requesting status of last tetanus. Chart ordered.  Initial call taken by: Lamar Sprinkles, CMA,  March 13, 2010 2:41 PM  Follow-up for Phone Call        Pt recieved minor cut on her finger from rusty metal. No record in paper chart going back 10 years of tetanus. Pt informed, she is coming in today for TDAP. Follow-up by: Lamar Sprinkles, CMA,  March 14, 2010 12:22 PM

## 2010-05-01 NOTE — Assessment & Plan Note (Signed)
Summary: TDAP/SD  Nurse Visit   Vitals Entered By: Lamar Sprinkles, CMA (March 14, 2010 2:26 PM)  Allergies: 1)  ! Sulfacetamide Sodium (Sulfacetamide Sodium (Acne)) 2)  ! Morphine Sulfate (Morphine Sulfate) 3)  ! Erythrocin Stearate 4)  Indocin (Indomethacin)  Immunizations Administered:  Tetanus Vaccine:    Vaccine Type: Tdap    Site: left deltoid    Mfr: GlaxoSmithKline    Dose: 0.5 ml    Route: IM    Given by: Lamar Sprinkles, CMA    Exp. Date: 01/17/2012    Lot #: [id number]    VIS given: 02/15/08 version given March 14, 2010.  Orders Added: 1)  Tdap => 75yrs IM [90715] 2)  Admin 1st Vaccine [[id number]]

## 2010-05-01 NOTE — Progress Notes (Signed)
Summary: Lab ?  Phone Note Call from Patient Call back at Home Phone [phone]   Summary of Call: Patient is requesting a call back before having her labs on wednesday.  Initial call taken by: Lamar Sprinkles, CMA,  March 10, 2010 5:07 PM  Follow-up for Phone Call        Spoke w/pt, she is concerned about signifigant hair loss over the last 2 mths. Pt has cpx labs and b12 for wednesday. Mother has hx of thyroid cancer.   Anything else to add to labs? Pt scheduled for office visit 12/23.  Follow-up by: Lamar Sprinkles, CMA,  March 10, 2010 6:03 PM  Additional Follow-up for Phone Call Additional follow up Details #1::        no Keep return office visit  Thank you!  Additional Follow-up by: Tresa Garter MD,  March 10, 2010 6:05 PM

## 2010-05-01 NOTE — Assessment & Plan Note (Signed)
Summary: 4 mth physical--stc   Vital Signs:  Patient profile:   45 year old female Height:      64 inches Weight:      142 pounds BMI:     24.46 Temp:     98.5 degrees F oral Pulse rate:   92 / minute Pulse rhythm:   regular Resp:     16 per minute BP sitting:   100 / 62  (left arm) Cuff size:   regular  Vitals Entered By: Lanier Prude, Beverly Gust) (March 21, 2010 8:43 AM) CC: CPX Is Patient Diabetic? No Comments pt is not taking Metronidazole, Loratadine, Singulair, Vit D 50,000   CC:  CPX.  History of Present Illness: The patient presents for a preventive health examination  C/o hair falling out x 2 months   Current Medications (verified): 1)  Vitamin B-12 Cr 1000 Mcg  Tbcr (Cyanocobalamin) .... Take One Tablet By Mouth Daily 2)  Vitamin D3 1000 Unit  Tabs (Cholecalciferol) .Marland Kitchen.. 1 By Mouth Daily 3)  Metronidazole 0.75 % Gel (Metronidazole) .... Use Qd 4)  Loratadine 10 Mg  Tabs (Loratadine) .... Once Daily As Needed Allergies 5)  Singulair 10 Mg Tabs (Montelukast Sodium) .Marland Kitchen.. 1 By Mouth Daily 6)  Naproxen 500 Mg Tabs (Naproxen) .Marland Kitchen.. 1 By Mouth Two Times A Day Pc 7)  Vitamin D (Ergocalciferol) 50000 Unit Caps (Ergocalciferol) .Marland Kitchen.. 1 By Mouth Q 1 Week X 3 Weeks Then Start Vit D 1000 International Units Qd  Allergies (verified): 1)  ! Sulfacetamide Sodium (Sulfacetamide Sodium (Acne)) 2)  ! Morphine Sulfate (Morphine Sulfate) 3)  ! 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         working less  Physical Exam  General:  In no acute distress  Head:  Normocephalic and atraumatic without obvious abnormalities. No apparent alopecia or balding. Eyes:  No corneal or conjunctival inflammation noted. EOMI. Perrla.  Ears:  External ear exam shows no significant lesions or deformities.  Otoscopic examination reveals clear canals, tympanic membranes are intact bilaterally without bulging, retraction, inflammation or discharge. Hearing is grossly normal bilaterally. Nose:  External nasal examination shows no deformity or inflammation. Nasal mucosa are pink and moist without lesions or exudates. Mouth:  Oral mucosa and oropharynx without lesions or exudates.  Teeth in good repair. Neck:  No deformities, masses, or tenderness noted. Lungs:  Normal respiratory effort, chest expands symmetrically. Lungs are clear to auscultation, no crackles or wheezes. Heart:  Normal rate and regular rhythm. S1 and S2 normal without gallop, murmur, click, rub or other extra sounds. Abdomen:  Bowel sounds positive,abdomen soft and non-tender without masses, organomegaly or  hernias noted. Sensitive in lower 1/2 R>L Msk:  No deformity or scoliosis noted of thoracic or lumbar spine.   Pulses:  R and L carotid,radial,femoral,dorsalis pedis and posterior tibial pulses are full and equal  bilaterally Extremities:  No clubbing, cyanosis, edema, or deformity noted with normal full range of motion of all joints.   Neurologic:  No cranial nerve deficits noted. Station and gait are normal. Plantar reflexes are down-going bilaterally. DTRs are symmetrical throughout. Sensory, motor and coordinative functions appear intact. Skin:  Rosacea on face No areal alopecia Cervical Nodes:  No lymphadenopathy noted Inguinal Nodes:  No significant adenopathy Psych:   normally interactive and good eye contact.  A little anxious appearing and depressed appearing.  not suicidal and not homicidal.     Impression & Recommendations:  Problem # 1:  HEALTH MAINTENANCE EXAM (ICD-V70.0) Assessment New mammo and pap is pending  The labs were reviewed with the patient.  Health and age related issues were discussed. Available screening tests and vaccinations were discussed as well. Healthy life style including good diet and exercise was discussed.   Problem # 2:  HAIR LOSS (ICD-704.00) ? etiol Assessment: New Clobetazol sol top. D/c Tea Tree oil product Can try Rogaine for women  Problem # 3:  FATIGUE (ICD-780.79) Assessment: Improved  Problem # 4:  FIBROMYALGIA (ICD-729.1) Assessment: Improved  Her updated medication list for this problem includes:    Naproxen 500 Mg Tabs (Naproxen) .Marland Kitchen... 1 by mouth two times a day pc  Complete Medication List: 1)  Vitamin B-12 Cr 1000 Mcg Tbcr (Cyanocobalamin) .... Take one tablet by mouth daily 2)  Vitamin D3 1000 Unit Tabs (Cholecalciferol) .Marland Kitchen.. 1 by mouth daily 3)  Metronidazole 0.75 % Gel (Metronidazole) .... Use qd 4)  Loratadine 10 Mg Tabs (Loratadine) .... Once daily as needed allergies 5)  Naproxen 500 Mg Tabs (Naproxen) .Marland Kitchen.. 1 by mouth two times a day pc 6)  Vitamin D (ergocalciferol) 50000 Unit Caps (Ergocalciferol) .Marland Kitchen.. 1 by mouth q 1 week x 3 weeks then start vit d 1000 international units qd 7)  Clobetasol Propionate 0.05 % Soln (Clobetasol  propionate) .... Use on hair as dirrected 2-3 times per week  Patient Instructions: 1)  Call if you are not better in a reasonable amount of time or if worse.  Prescriptions: CLOBETASOL PROPIONATE 0.05 % SOLN (CLOBETASOL PROPIONATE) use on hair as dirrected 2-3 times per week  #300 ml x 1   Entered and Authorized by:   Tresa Garter MD   Signed by:   Tresa Garter MD on 03/21/2010   Method used:   Print then Give to Patient   RxID:   [id number] SALAGEN 5 MG TABS (PILOCARPINE HCL) 1 by mouth qid for dry mouth  #120 x 12   Entered and Authorized by:   Tresa Garter MD   Signed by:   Tresa Garter MD on 03/21/2010   Method used:   Print then Give to Patient   RxID:   [id number]    Orders Added: 1)  Est. Patient 40-64 years [84696]

## 2010-05-08 ENCOUNTER — Ambulatory Visit (INDEPENDENT_AMBULATORY_CARE_PROVIDER_SITE_OTHER): Payer: BC Managed Care – PPO | Admitting: Internal Medicine

## 2010-05-08 ENCOUNTER — Encounter: Payer: Self-pay | Admitting: Internal Medicine

## 2010-05-08 DIAGNOSIS — J069 Acute upper respiratory infection, unspecified: Secondary | ICD-10-CM | POA: Insufficient documentation

## 2010-05-08 DIAGNOSIS — R5381 Other malaise: Secondary | ICD-10-CM

## 2010-05-20 ENCOUNTER — Encounter: Payer: Self-pay | Admitting: Internal Medicine

## 2010-05-20 ENCOUNTER — Ambulatory Visit (INDEPENDENT_AMBULATORY_CARE_PROVIDER_SITE_OTHER): Payer: BC Managed Care – PPO | Admitting: Internal Medicine

## 2010-05-20 DIAGNOSIS — R1310 Dysphagia, unspecified: Secondary | ICD-10-CM

## 2010-05-20 DIAGNOSIS — R5381 Other malaise: Secondary | ICD-10-CM

## 2010-05-20 DIAGNOSIS — K219 Gastro-esophageal reflux disease without esophagitis: Secondary | ICD-10-CM

## 2010-05-20 DIAGNOSIS — J320 Chronic maxillary sinusitis: Secondary | ICD-10-CM | POA: Insufficient documentation

## 2010-05-21 NOTE — Assessment & Plan Note (Signed)
Summary: CHEST PAIN   Vital Signs:  Patient profile:   45 year old female Height:      64 inches Weight:      141 pounds BMI:     24.29 Temp:     98.3 degrees F oral Pulse rate:   88 / minute Pulse rhythm:   regular Resp:     16 per minute BP sitting:   100 / 60  (left arm) Cuff size:   regular  Vitals Entered By: Lanier Prude, CMA(AAMA) (May 08, 2010 11:35 AM) CC: Swollen/painful glands in Lt side of neck, pain in Lt side of chest X 1 wk Is Patient Diabetic? No Comments pt is not taking metronidazole or loratadine   CC:  Swollen/painful glands in Lt side of neck and pain in Lt side of chest X 1 wk.  History of Present Illness: The patient presents with complaints of sore throat, fever, cough, sinus congestion and drainge of seven days duration. Not better with OTC meds. Chest hurts with coughing. Can't sleep due to cough. Muscle aches are present.  The mucus is colored.   Current Medications (verified): 1)  Vitamin B-12 Cr 1000 Mcg  Tbcr (Cyanocobalamin) .... Take One Tablet By Mouth Daily 2)  Vitamin D3 1000 Unit  Tabs (Cholecalciferol) .Marland Kitchen.. 1 By Mouth Daily 3)  Metronidazole 0.75 % Gel (Metronidazole) .... Use Qd 4)  Loratadine 10 Mg  Tabs (Loratadine) .... Once Daily As Needed Allergies 5)  Naproxen 500 Mg Tabs (Naproxen) .Marland Kitchen.. 1 By Mouth Two Times A Day Pc 6)  Vitamin D (Ergocalciferol) 50000 Unit Caps (Ergocalciferol) .Marland Kitchen.. 1 By Mouth Q 1 Week X 3 Weeks Then Start Vit D 1000 International Units Qd 7)  Clobetasol Propionate 0.05 % Soln (Clobetasol Propionate) .... Use On Hair As Dirrected 2-3 Times Per Week  Allergies (verified): 1)  ! Sulfacetamide Sodium (Sulfacetamide Sodium (Acne)) 2)  ! Morphine Sulfate (Morphine Sulfate) 3)  ! 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    Physical Exam  General:  In no acute distress  Mouth:  Erythematous throat and intranasal mucosa c/w URI  Lungs:  Normal respiratory effort, chest expands symmetrically. Lungs are clear to auscultation, no crackles or wheezes. Heart:  Normal rate and regular rhythm. S1 and S2 normal without gallop, murmur, click, rub or other extra sounds. Skin:  Rosacea on face No areal alopecia   Impression & Recommendations:  Problem # 1:  FATIGUE (ICD-780.79) due to #1 Assessment Deteriorated  Problem # 2:  UPPER RESPIRATORY INFECTION, ACUTE (ICD-465.9) Assessment: New Zpac Her updated medication list for this problem includes:    Loratadine 10 Mg Tabs (Loratadine) ..... Once daily as needed allergies    Naproxen 500 Mg Tabs (Naproxen) .Marland Kitchen... 1 by mouth two times a day pc  Complete Medication List: 1)  Vitamin B-12 Cr 1000 Mcg Tbcr (Cyanocobalamin) .... Take one tablet by mouth daily 2)  Vitamin D3 1000 Unit Tabs (Cholecalciferol) .Marland Kitchen.. 1 by mouth daily 3)  Metronidazole 0.75 % Gel (Metronidazole) .... Use qd 4)  Loratadine 10 Mg Tabs (Loratadine) .... Once daily as needed allergies  5)  Naproxen 500 Mg Tabs (Naproxen) .Marland Kitchen.. 1 by mouth two times a day pc 6)  Vitamin D (ergocalciferol) 50000 Unit Caps (Ergocalciferol) .Marland Kitchen.. 1 by mouth q 1 week x 3 weeks then start vit d 1000 international units qd 7)  Clobetasol Propionate 0.05 % Soln (Clobetasol propionate) .... Use on hair as dirrected 2-3 times per week 8)  Zithromax Z-pak 250 Mg Tabs (Azithromycin) .... As dirrected  Patient Instructions: 1)  Use over-the-counter medicines for "cold": Tylenol  650mg  or Advil 400mg  every 6 hours  for fever; Delsym or Robutussin for cough. Mucinex or Mucinex D for congestion. Ricola or Halls for sore throat.  Office visit if not better or if worse.  Prescriptions: ZITHROMAX Z-PAK 250 MG TABS (AZITHROMYCIN) as dirrected  #1 x 0   Entered and Authorized by:   Tresa Garter MD   Signed by:   Tresa Garter MD on 05/08/2010   Method used:   Electronically to        [facility]  [place] [address] (retail)       [address]       [place], [place]  [address]       Ph: [phone] or [phone]       Fax: [phone]   RxID:   [id number]    Orders Added: 1)  Est. Patient Level III [27253]

## 2010-05-24 DIAGNOSIS — K219 Gastro-esophageal reflux disease without esophagitis: Secondary | ICD-10-CM | POA: Insufficient documentation
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
## 2010-07-14 LAB — POCT I-STAT 4, (NA,K, GLUC, HGB,HCT)
HCT: 43 % (ref 36.0–46.0)
Hemoglobin: 14.6 g/dL (ref 12.0–15.0)

## 2010-08-12 NOTE — Op Note (Signed)
Regina [name], Regina [name]              ACCOUNT NO.:  [id number]   MEDICAL RECORD NO.:  [id number]          PATIENT TYPE:  AMB   LOCATION:  [facility]                         FACILITY:  [facility]   PHYSICIAN:  Anselm Pancoast. Weatherly, M.D.DATE OF BIRTH:  11-02-1965   DATE OF PROCEDURE:  04/12/2008  DATE OF DISCHARGE:                               OPERATIVE REPORT   PREOPERATIVE DIAGNOSIS:  Large anterior hemorrhoid.   OPERATION:  Examination under anesthesia and excision of a large  internal and external hemorrhoid anteriorly.   ANESTHESIA:  General anesthesia.   POSITION:  The lithotomy position.   SURGEON:  Anselm Pancoast. Zachery Dakins, M.D.   INDICATIONS FOR PROCEDURE:  Regina [name] is a 45 year old  Caucasian female, mother of two, who is an exercise enthusiast and also  has had a problem intermittently with a large hemorrhoid that would be  quite symptomatic at times.  I saw Regina [name] in the office approximately one  month ago, and on examination I could not find any other abnormalities.  Regina [name] symptoms were kind of spasm pains.  Sometimes the hemorrhoid would  swell, etc, and I recommended that we be very attentive to bowel  function, which Regina [name] has been, and let me re-examine Regina [name].  I saw Regina [name]  before Christmas and at that time there was still this large external  hemorrhoid.  On anoscopic exam in the office, I was not that impressed  with the posterior areas and I recommended that we do a general  anesthesia and examine, to see whether or not we could find a fissure or  whatever.  Regina [name] is here for the planned procedure.  Regina [name] did take an enema  this morning.  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   DESCRIPTION OF PROCEDURE:  The patient was given 1 gram of Ancef.  Regina [name]  laboratory studies were normal.  Regina [name] was induced with general anesthesia  with an LOA tube.  The patient was placed up in the lithotomy position.  In this position, this large hemorrhoid anteriorly is  definitely  present, but I did not actually prep Regina [name] until after I had examined Regina [name].  When I examined Regina [name] with the anoscope, Regina [name] has fairly significant  internal hemorrhoid anteriorly.  The posterior on both the left and  right is really essentially unremarkable.  I think it would be best to  go ahead and excise not only the external component, but the internal  component anteriorly.  I could not see any evidence of a fissure, and  Regina [name] was not really having significant anal spasm when I saw Regina [name] in the  office.  I then used a Betadine to prep the anus and anesthetize Regina [name]  with about 20 mL of Marcaine, with 10 mL on the left and the right.  Hopefully we have Regina [name] anesthetized.  Then after draping in a sterile  manner, we used the bullet retractor and I elected to excise this  external hemorrhoid, elevating it on its pedicle and then the little  area anteriorly, on both the left and right, I sutured  it with a #2-0  chromic.  Then with the hemorrhoid elevated off the internal sphincter  area, I under-clamped it with a Buie clamp and then removed this large  internal and external hemorrhoid.  I put some mattress sutures of #2-0  chromic under the Buie clamp and then after these were tied, then  starting at the apex, sutured kind of a locking suture with the #2-0  chromic, then after we got into the external portion of the external  hemorrhoid area.  I then elected to kind of close Regina [name] transversely so  that we will not get really a stenosis on the anus.  I took the running  suture and tied it to the mid-section, and then used #3-0 chromic  interrupted sutures, going both to the left and to the right.  I  inspected the area quite carefully for bleeding.  I could not find any.  Then I placed some 5% Xylocaine ointment within the anal canal and used  the open 4 x 4 to kind of hold it in place for the immediate  postoperative pain.   Regina [name] will be released after a short stay in the recovery room.   Regina [name] will  continue all of Regina [name] chronic medications.  Regina [name] has Vicodin plus the  Xylocaine ointment for discomfort.  Regina [name] is on a moderate number of  health food-type medications plus laxatives, which Regina [name] will continue.  Regina [name] will soak in the bathtub two times daily or after bowel movements  for the next couple of weeks.  I am going to give Regina [name] some Vicodin for  pain and also 5% Xylocaine ointment.   I will see Regina [name] back in the office in approximately 10 days.           ______________________________  Anselm Pancoast. Zachery Dakins, M.D.     WJW/MEDQ  D:  04/12/2008  T:  04/12/2008  Job:  [id number]

## 2010-08-15 NOTE — H&P (Signed)
Regina [name], Regina [name]                        ACCOUNT NO.:  [id number]   MEDICAL RECORD NO.:  [id number]                   PATIENT TYPE:  AMB   LOCATION:  DAY                                  FACILITY:  [facility]   PHYSICIAN:  Howard C. Mezer, M.D.               DATE OF BIRTH:  [birth year]   DATE OF ADMISSION:  10/11/2003  DATE OF DISCHARGE:                                HISTORY & PHYSICAL   ADMISSION DIAGNOSIS:  Fibroid uterus.   Patient is a 45 year old gravida 4, para 2, AB 2 female admitted with last  menstrual period on September 28, 2003.  Admitted with fibroid uterus and pelvic  pain.  Patient has been complaining of increased pelvic pain and pressure  that increases with prolonged standing.  She has had no stress urinary  incontinence.  Examination reveals an approximately 5 cm posterior fibroid  filling the cul de sac that appears to be mobile but is quite tender.  On  ultrasound examination, there appears to be a posterior fibroid, measuring  70.7 x 52.8 mm.  The patient feels that her pain is sufficient to justify  the risk for a hysterectomy.  Vaginal versus abdominal hysterectomy has been  discussed with the patient, and given her history of endometriosis and the  fact that the fibroid fills the cul de sac and measures 7 cm on ultrasound,  it is felt safer to perform a total abdominal hysterectomy.  A total  abdominal hysterectomy with question of bilateral salpingo-oophorectomy has  been discussed with the patient in detail.  Potential complications  including but not limited to anesthesia, injury to the bowel, bladder,  ureters, possible fistula formation, and possible transfusion with sequelae  and possible infection in the pelvis or the wound have been discussed in  detail.  The patient wishes to preserve her ovaries but knows that one or  both of the ovaries will be removed if it is surgically necessary or if  there is significant disease.  The postoperative expectations  and  restrictions have been reviewed in detail.  The ACOG booklet has been  reviewed.  Pain control has been discussed.  Permanent sterilization has  been emphasized.  The patient understands that there is absolutely no  guarantee to relieve her pain with the surgery.  All the patient's questions  have been answered, and she appears to understand the alternatives to the  proposed surgery and has realistic expectations regarding the surgery.  The  patient has a question of mitral valve prolapse, and at this time, there is  no audible murmur and no click.  The patient will contact her cardiologist,  Dr. Verdis Prime, to see if he advises SBE prophylaxis.  Patient also states  that she has had a normal echo which did not document mitral valve prolapse.   PAST SURGICAL HISTORY:  1. T&A.  2. Exploratory laparotomy for a cyst.  3. Appendectomy.  4.  Tubal ligation.   PAST MEDICAL HISTORY:  B12 deficiency.   MEDICATIONS:  B12 and some herbal product on September 30, 2003.  The patient has  cleared this with Dr. Almeta Monas and feels it is not a problem for anesthesia.  The patient will accept all additional responsibilities regarding any  untoward outcome related to this problem.   ALLERGIES:  MYCINS, SULFA, and INDOCIN, although the patient says she has  no problems with over-the-counter NSAIDs.   SMOKING:  None.   ETOH:  None.   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   FAMILY HISTORY:  Positive for cancer of the breast.   PHYSICAL EXAMINATION:  HEENT:  Negative.  LUNGS:  Clear.  HEART:  Without murmurs.  BREASTS:  Without masses or discharge.  ABDOMEN:  Soft and nontender.  PELVIC:  BUS, vagina, and cervix to be normal.  The uterus is retroverted  with a posterior large fibroid.  EXTREMITIES:  Negative.   IMPRESSION:  Fibroid uterus, pelvic pain.   PLAN:  Total abdominal hysterectomy.  Question bilateral salpingo-  oophorectomy.                                                Leatha Gilding. Mezer, M.D.    HCM/MEDQ  D:  10/10/2003  T:  10/10/2003  Job:  [id number]

## 2010-08-15 NOTE — Op Note (Signed)
Regina [name], Regina [name]                        ACCOUNT NO.:  [id number]   MEDICAL RECORD NO.:  [id number]                   PATIENT TYPE:  OBV   LOCATION:  0098                                 FACILITY:  [facility]   PHYSICIAN:  Leatha Gilding. Mezer, M.D.               DATE OF BIRTH:  January 15, 1966   DATE OF PROCEDURE:  10/11/2003  DATE OF DISCHARGE:                                 OPERATIVE REPORT   PREOPERATIVE DIAGNOSES:  1. Fibroid uterus.  2. Pelvic pain.   POSTOPERATIVE DIAGNOSES:  1. Fibroid uterus.  2. Pelvic pain.   OPERATION PERFORMED:  Total abdominal hysterectomy.   SURGEON:  Dr. Teodora Medici   ASSISTANT:  Dr. Harl Bowie   ANESTHESIA:  General endotracheal.   PREPARATION:  Betadine.   DESCRIPTION OF PROCEDURE:  With the patient in the supine position, was  prepped and draped in the routine fashion.  A Pfannenstiel incision was made  through an old scar, excising the old scar.  The incision was carried down  to the subcutaneous tissue, and the fascia and peritoneum were opened  without difficulty.  A brief exploration of her upper abdomen was benign.  Exploration of the pelvis revealed the uterus to be approximately 14 weeks  in size, somewhat irregular, and the ovaries were grossly normal.  The round  ligaments were suture ligated with #1 chromic and divided.  The uteroovarian  ligaments were clamped, cut, and free tied with #1 chromic and suture  ligated with #1 chromic.  The anterior leaf of the broad ligament was opened  and the bladder taken down sharply and bluntly.  The uterine artery and  veins were clamped, cut, and suture ligated with #1 chromic.  The cardinal  ligaments were taken in several bites, clamped, cut, and suture ligated with  #1 chromic.  The cervix was quite long, but the fornices appeared to be  deep, and an effort was made to spare as much vaginal tissue as possible.  The vagina was entered anteriorly and the specimen excised with  circumferential dissection.  The cuff was very bloody, both anteriorly and  posteriorly, and this is where the majority of the blood loss occurred.  The  cuff was then closed with TeLinde sutures at the angle of #1 chromic, and  the cuff was whipped anteriorly and posteriorly with running lock #1 chromic  suture.  The bladder was out of harm's way.  The pelvis was irrigated with  copious amounts of warm lactated Ringer's solution, hemostasis assured  throughout the pelvis.  The ureters were identified and found to be out of  harm's way and not dilated and peristalsing.  The vaginal cuff was closed  with interrupted 0 chromic sutures, and the latter could not be placed over  the cuff, as the tissue would be under tension.  After hemostasis was  assured, an effort was made to place the large bowel in the  cul-de-sac.  The  omentum was brought down.  The abdomen was closed in layers using a running  2-0 Vicryl on the peritoneum, running 0 Vicryl to the midline bilaterally on  the  fascia.  Hemostasis was assured in the subcutaneous tissue, and the skin was  closed with staples.  The estimated blood loss was approximately 525 mL.  The sponge, instrument, and needle counts were correct x 2.  The patient  tolerated the procedure well and was taken to recovery room in satisfactory  condition.                                               Leatha Gilding. Mezer, M.D.    HCM/MEDQ  D:  10/11/2003  T:  10/11/2003  Job:  [id number]   cc:   Leona Singleton, M.D.  [address]  [place]  [place] [address]  Fax: [phone]   Georgina Quint. Plotnikov, M.D. [facility]

## 2010-11-13 ENCOUNTER — Ambulatory Visit (INDEPENDENT_AMBULATORY_CARE_PROVIDER_SITE_OTHER)
Admission: RE | Admit: 2010-11-13 | Discharge: 2010-11-13 | Disposition: A | Discharge status: Home / Self Care | Payer: BC Managed Care – PPO | Source: Ambulatory Visit | Attending: Internal Medicine | Admitting: Internal Medicine

## 2010-11-13 ENCOUNTER — Ambulatory Visit (INDEPENDENT_AMBULATORY_CARE_PROVIDER_SITE_OTHER): Payer: BC Managed Care – PPO | Admitting: Internal Medicine

## 2010-11-13 ENCOUNTER — Other Ambulatory Visit (INDEPENDENT_AMBULATORY_CARE_PROVIDER_SITE_OTHER): Payer: BC Managed Care – PPO

## 2010-11-13 ENCOUNTER — Encounter: Payer: Self-pay | Admitting: Internal Medicine

## 2010-11-13 VITALS — BP 92/68 | HR 64 | Temp 98.1°F | Ht 64.0 in | Wt 141.4 lb

## 2010-11-13 DIAGNOSIS — R61 Generalized hyperhidrosis: Secondary | ICD-10-CM

## 2010-11-13 DIAGNOSIS — R0789 Other chest pain: Secondary | ICD-10-CM

## 2010-11-13 DIAGNOSIS — K219 Gastro-esophageal reflux disease without esophagitis: Secondary | ICD-10-CM

## 2010-11-13 LAB — HEPATIC FUNCTION PANEL
ALT: 11 U/L (ref 0–35)
AST: 17 U/L (ref 0–37)
Bilirubin, Direct: 0.1 mg/dL (ref 0.0–0.3)
Total Bilirubin: 0.5 mg/dL (ref 0.3–1.2)

## 2010-11-13 LAB — CBC WITH DIFFERENTIAL/PLATELET
Basophils Relative: 0.3 % (ref 0.0–3.0)
Eosinophils Relative: 0.1 % (ref 0.0–5.0)
HCT: 37.9 % (ref 36.0–46.0)
Lymphocytes Relative: 31.1 % (ref 12.0–46.0)
Lymphs Abs: 2.2 10*3/uL (ref 0.7–4.0)
MCHC: 33.7 g/dL (ref 30.0–36.0)
MCV: 89.4 fl (ref 78.0–100.0)
Neutrophils Relative %: 61.8 % (ref 43.0–77.0)
Platelets: 312 10*3/uL (ref 150.0–400.0)
RBC: 4.24 Mil/uL (ref 3.87–5.11)

## 2010-11-13 LAB — URINALYSIS, ROUTINE W REFLEX MICROSCOPIC
Bilirubin Urine: NEGATIVE
Ketones, ur: NEGATIVE
Nitrite: NEGATIVE
Urine Glucose: NEGATIVE
Urobilinogen, UA: 0.2 (ref 0.0–1.0)

## 2010-11-13 LAB — BASIC METABOLIC PANEL
GFR: 92.87 mL/min (ref >60.00)
Sodium: 137 mEq/L (ref 135–145)

## 2010-11-13 NOTE — Patient Instructions (Signed)
Continue all other medications as before Please go to LAB in the Basement for the blood and/or urine tests to be done today Please go to XRAY in the Basement for the x-ray test Please call the phone number [phone] (the PhoneTree System) for results of testing in 2-3 days;  When calling, simply dial the number, and when prompted enter the MRN number above (the Medical Record Number) and the # key, then the message should start. You are given the work note today

## 2010-11-13 NOTE — Progress Notes (Signed)
  Subjective:    Patient ID: Regina [name], female    DOB: 1965-11-06, 45 y.o.   MRN: [id number]  HPI  Here with c/o episode of nausea, sweats, sob, chest pressure just after sitting in heat in drivethru for a meal, occurred at 240 pm, lasted several minutes, still with some sense of "hot flash" and nauasea but o/w symtpoms resolved;   Pt denies recent fever, wt loss, night sweats, loss of appetite, or other constitutional symptoms  Denies urinary symptoms such as dysuria, frequency, urgency,or hematuria. Denies worsening depressive symptoms, suicidal ideation, or panic.  Pt denies chest pain, increased sob or doe, wheezing, orthopnea, PND, increased LE swelling, palpitations, dizziness or syncope except for the above.  Pt denies new neurological symptoms such as new headache, or facial or extremity weakness or numbness   Pt denies polydipsia, polyuria.Denies worsening reflux, dysphagia, abd pain, n/v, bowel change or blood. Past Medical History  Diagnosis Date  . B12 DEFICIENCY 01/02/2008  . Rosacea 12/27/2008  . Allergic urticaria 12/27/2008  . ARTHRALGIA 06/26/2009  . LOW BACK PAIN 02/20/2009  . FIBROMYALGIA 11/20/2009  . GERD 05/24/2010  . CFS (chronic fatigue syndrome)    Past Surgical History  Procedure Date  . Appendectomy   . Abdominal hysterectomy     partial    reports that she has quit smoking. She does not have any smokeless tobacco history on file. She reports that she does not drink alcohol. Her drug history not on file. family history is negative for Asthma. Allergies  Allergen Reactions  . Erythromycin Stearate     REACTION: all mycins  . Indomethacin     REACTION: lupus like illness  . Morphine Sulfate   . Sulfacetamide Sodium    No current outpatient prescriptions on file prior to visit.   Review of Systems Review of Systems  Constitutional: Negative for diaphoresis and unexpected weight change.  HENT: Negative for drooling and tinnitus.   Eyes: Negative for  photophobia and visual disturbance.  Respiratory: Negative for choking and stridor.   Gastrointestinal: Negative for vomiting and blood in stool.  Genitourinary: Negative for hematuria and decreased urine volume.    Objective:   Physical Exam BP 92/68  Pulse 64  Temp(Src) 98.1 F (36.7 C) (Oral)  Ht 5\' 4"  (1.626 m)  Wt 141 lb 6 oz (64.127 kg)  BMI 24.27 kg/m2  SpO2 98% Physical Exam  VS noted, not ill appearing Constitutional: Pt appears well-developed and well-nourished.  HENT: Head: Normocephalic.  Right Ear: External ear normal.  Left Ear: External ear normal.  Eyes: Conjunctivae and EOM are normal. Pupils are equal, round, and reactive to light.  Neck: Normal range of motion. Neck supple.  Cardiovascular: Normal rate and regular rhythm.   Pulmonary/Chest: Effort normal and breath sounds normal.  Abd:  Soft, NT, non-distended, + BS Neurological: Pt is alert. No cranial nerve deficit.  Skin: Skin is warm. No erythema.  Psychiatric: Pt behavior is normal. Thought content normal. 1+ nervous        Assessment & Plan:

## 2010-11-13 NOTE — Assessment & Plan Note (Signed)
?   Heat related, anxiety vs other?, assoc with nausea, sob and chest heaviness, ECG reveiwed  - sinus without acute; exam benign; Will check labs including cbc, UA and CXR , follow with expectant management; work note given

## 2010-11-16 ENCOUNTER — Encounter: Payer: Self-pay | Admitting: Internal Medicine

## 2010-11-16 DIAGNOSIS — R0789 Other chest pain: Secondary | ICD-10-CM | POA: Insufficient documentation

## 2010-11-16 DIAGNOSIS — M9902 Segmental and somatic dysfunction of thoracic region: Secondary | ICD-10-CM | POA: Insufficient documentation

## 2010-11-16 NOTE — Assessment & Plan Note (Signed)
stable overall by hx and exam, most recent data reviewed with pt, and pt to continue medical treatment as before  Lab Results  Component Value Date   WBC 7.2 11/13/2010   HGB 12.8 11/13/2010   HCT 37.9 11/13/2010   PLT 312.0 11/13/2010   CHOL 174 03/18/2010   TRIG 48.0 03/18/2010   HDL 54.50 03/18/2010   ALT 11 11/13/2010   AST 17 11/13/2010   NA 137 11/13/2010   K 3.7 11/13/2010   CL 102 11/13/2010   CREATININE 0.7 11/13/2010   BUN 11 11/13/2010   CO2 28 11/13/2010   TSH 1.42 03/18/2010

## 2010-11-16 NOTE — Assessment & Plan Note (Signed)
?   Etiology, ecg as above, transient for minutes, exam benign, doubt PE/pna/chf ,  to f/u any worsening symptoms or concerns,

## 2010-12-15 ENCOUNTER — Encounter: Payer: Self-pay | Admitting: Endocrinology

## 2010-12-15 ENCOUNTER — Encounter: Payer: Self-pay | Admitting: *Deleted

## 2010-12-15 ENCOUNTER — Ambulatory Visit (INDEPENDENT_AMBULATORY_CARE_PROVIDER_SITE_OTHER): Payer: BC Managed Care – PPO | Admitting: Endocrinology

## 2010-12-15 VITALS — BP 108/60 | HR 63 | Temp 98.9°F | Ht 64.5 in | Wt 143.8 lb

## 2010-12-15 DIAGNOSIS — J069 Acute upper respiratory infection, unspecified: Secondary | ICD-10-CM

## 2010-12-15 MED ORDER — DOXYCYCLINE HYCLATE 100 MG PO TABS: 100.0000 mg | ORAL_TABLET | Freq: Two times a day (BID) | ORAL | Status: AC

## 2010-12-15 NOTE — Patient Instructions (Addendum)
i have sent a prescription to your pharmacy, for an antibiotic. Loratadine-d (non-prescription) will help your congestion. I hope you feel better soon.  If you don't feel better by next week, please call doctor plotnikov.

## 2010-12-15 NOTE — Progress Notes (Signed)
  Subjective:    Patient ID: Regina [name], female    DOB: 05-29-1965, 45 y.o.   MRN: [id number]  HPI Pt states 3 days of mild "pressure" sensation at the bimaxillary areas, and assoc rhinorrhea. Past Medical History  Diagnosis Date  . B12 DEFICIENCY 01/02/2008  . Rosacea 12/27/2008  . Allergic urticaria 12/27/2008  . ARTHRALGIA 06/26/2009  . LOW BACK PAIN 02/20/2009  . FIBROMYALGIA 11/20/2009  . GERD 05/24/2010  . CFS (chronic fatigue syndrome)     Past Surgical History  Procedure Date  . Appendectomy   . Abdominal hysterectomy     partial    History   Social History  . Marital Status: Married    Spouse Name: N/A    Number of Children: N/A  . Years of Education: N/A   Occupational History  . Not on file.   Social History Main Topics  . Smoking status: Former Games developer  . Smokeless tobacco: Not on file  . Alcohol Use: No  . Drug Use:   . Sexually Active:    Other Topics Concern  . Not on file   Social History Narrative   Working 2 jobs, 6d/wkRegular exercise-noDivorced    Current Outpatient Prescriptions on File Prior to Visit  Medication Sig Dispense Refill  . Cholecalciferol (VITAMIN D) 1000 UNITS capsule Take 1,000 Units by mouth daily.        . Cyanocobalamin (VITAMIN B-12 CR) 1000 MCG TBCR Take 1 tablet by mouth daily.         Allergies  Allergen Reactions  . Erythromycin Stearate     REACTION: all mycins  . Indomethacin     REACTION: lupus like illness  . Morphine Sulfate   . Sulfacetamide Sodium    Family History  Problem Relation Age of Onset  . Asthma Neg Hx    BP 108/60  Pulse 63  Temp(Src) 98.9 F (37.2 C) (Oral)  Ht 5' 4.5" (1.638 m)  Wt 143 lb 12.8 oz (65.227 kg)  BMI 24.30 kg/m2  SpO2 98%  Review of Systems She has bilat otalgia, but no fever.    Objective:   Physical Exam VITAL SIGNS:  See vs page GENERAL: no distress head: no deformity eyes: no periorbital swelling, no proptosis external nose and ears are normal mouth: no  lesion seen.ear    Assessment & Plan:  Glenford Peers, new

## 2011-01-06 ENCOUNTER — Ambulatory Visit (INDEPENDENT_AMBULATORY_CARE_PROVIDER_SITE_OTHER): Payer: BC Managed Care – PPO | Admitting: Internal Medicine

## 2011-01-06 ENCOUNTER — Encounter: Payer: Self-pay | Admitting: *Deleted

## 2011-01-06 ENCOUNTER — Encounter: Payer: Self-pay | Admitting: Internal Medicine

## 2011-01-06 VITALS — BP 110/52 | HR 78 | Temp 98.2°F | Ht 66.0 in

## 2011-01-06 DIAGNOSIS — H9209 Otalgia, unspecified ear: Secondary | ICD-10-CM

## 2011-01-06 DIAGNOSIS — H6980 Other specified disorders of Eustachian tube, unspecified ear: Secondary | ICD-10-CM

## 2011-01-06 DIAGNOSIS — H9202 Otalgia, left ear: Secondary | ICD-10-CM

## 2011-01-06 MED ORDER — GUAIFENESIN 400 MG PO TABS: 800.0000 mg | ORAL_TABLET | ORAL | Status: DC | PRN

## 2011-01-06 MED ORDER — PREDNISONE (PAK) 10 MG PO TABS: 10.0000 mg | ORAL_TABLET | ORAL | Status: AC

## 2011-01-06 NOTE — Progress Notes (Signed)
  Subjective:    Patient ID: Regina [name], female    DOB: [birth year], 45 y.o.   MRN: [id number]  HPI  complains of ear pain, left side Ongoing 3 weeks ago Briefly improved with doxy No fever, no drainage  Past Medical History  Diagnosis Date  . B12 DEFICIENCY   . Rosacea   . Allergic urticaria   . ARTHRALGIA   . LOW BACK PAIN   . FIBROMYALGIA   . GERD   . CFS (chronic fatigue syndrome)     Review of Systems  Constitutional: Positive for fatigue. Negative for fever and chills.  Respiratory: Negative for cough and shortness of breath.   Cardiovascular: Negative for chest pain and palpitations.       Objective:   Physical Exam BP 110/52  Pulse 78  Temp(Src) 98.2 F (36.8 C) (Oral)  Ht 5\' 6"  (1.676 m)  SpO2 98% Constitutional: She is appears well-developed and well-nourished. No distress.  HENT: Head: Normocephalic and atraumatic. Ears: B TMs hazy but no erythema; L with serous effusion; Nose: Nose normal.  Mouth/Throat: Oropharynx is clear and moist. No oropharyngeal exudate.  Eyes: Conjunctivae and EOM are normal. Pupils are equal, round, and reactive to light. No scleral icterus.  Neck: Normal range of motion. Neck supple. No LAD or JVD present. No thyromegaly present.  Cardiovascular: Normal rate, regular rhythm and normal heart sounds.  No murmur heard. No BLE edema. Pulmonary/Chest: Effort normal and breath sounds normal. No respiratory distress. She has no wheezes. Psychiatric: She has a normal mood and affect. Her behavior is normal. Judgment and thought content normal.   Lab Results  Component Value Date   WBC 7.2 11/13/2010   HGB 12.8 11/13/2010   HCT 37.9 11/13/2010   PLT 312.0 11/13/2010   GLUCOSE 94 11/13/2010   CHOL 174 03/18/2010   TRIG 48.0 03/18/2010   HDL 54.50 03/18/2010   LDLCALC 110* 03/18/2010   ALT 11 11/13/2010   AST 17 11/13/2010   NA 137 11/13/2010   K 3.7 11/13/2010   CL 102 11/13/2010   CREATININE 0.7 11/13/2010   BUN 11 11/13/2010   CO2  28 11/13/2010   TSH 1.42 03/18/2010       Assessment & Plan:   Ear pain, L - suspect eustachian tube dysfunction - no role for antibiotics evident - pred pak for antiinflamatory and advised guafienisen - also reviewed "herbal" meds that pt wishes to take

## 2011-01-06 NOTE — Patient Instructions (Addendum)
It was good to see you today. Pred pak and guaifenesin as discussed for eustachian tube and ear pain - Your prescription(s) have been submitted to your pharmacy. Please take as directed and contact our office if you believe you are having problem(s) with the medication(s).  Also take Vit C 2000U/day while sick - also ok to use zinc and "antioxidants" as discussed Work note provided as requested for this week

## 2011-02-03 ENCOUNTER — Other Ambulatory Visit (INDEPENDENT_AMBULATORY_CARE_PROVIDER_SITE_OTHER): Payer: BC Managed Care – PPO

## 2011-02-03 ENCOUNTER — Encounter: Payer: Self-pay | Admitting: Internal Medicine

## 2011-02-03 ENCOUNTER — Ambulatory Visit (INDEPENDENT_AMBULATORY_CARE_PROVIDER_SITE_OTHER): Payer: BC Managed Care – PPO | Admitting: Internal Medicine

## 2011-02-03 ENCOUNTER — Encounter: Payer: Self-pay | Admitting: *Deleted

## 2011-02-03 VITALS — BP 102/70 | HR 62 | Temp 98.9°F

## 2011-02-03 DIAGNOSIS — R5381 Other malaise: Secondary | ICD-10-CM

## 2011-02-03 DIAGNOSIS — G2581 Restless legs syndrome: Secondary | ICD-10-CM

## 2011-02-03 DIAGNOSIS — R894 Abnormal immunological findings in specimens from other organs, systems and tissues: Secondary | ICD-10-CM

## 2011-02-03 DIAGNOSIS — R768 Other specified abnormal immunological findings in serum: Secondary | ICD-10-CM

## 2011-02-03 DIAGNOSIS — M25579 Pain in unspecified ankle and joints of unspecified foot: Secondary | ICD-10-CM

## 2011-02-03 DIAGNOSIS — H9209 Otalgia, unspecified ear: Secondary | ICD-10-CM

## 2011-02-03 DIAGNOSIS — R5383 Other fatigue: Secondary | ICD-10-CM

## 2011-02-03 DIAGNOSIS — M25571 Pain in right ankle and joints of right foot: Secondary | ICD-10-CM

## 2011-02-03 LAB — VITAMIN B12: Vitamin B-12: 306 pg/mL (ref 211–911)

## 2011-02-03 LAB — TSH: TSH: 1.58 u[IU]/mL (ref 0.35–5.50)

## 2011-02-03 NOTE — Progress Notes (Signed)
Subjective:    Patient ID: Regina [name], female    DOB: [birth year], 45 y.o.   MRN: [id number]  HPI  complains of continued ear problems, left side - ? Sinusitis problem Ongoing >2 months Briefly improved with doxy and pred pak - but feels "tunnel" and decreased hearing on L side No fever, no drainage ?ENT eval - prev saw [facility] same  fibromyalgia - hx intermittently positive ANA but no known autoimmune dz proven - Overwhelming fatigue, worse in past 2 month Requests followup rhuem eval, remotely seen by Regina [name] - Regina [name] requested now  ?RLS symptoms - achy pain in right ankle since surgical intervention by a podiatrist 2008. Unable to sleep at night due to pain and aches in both legs causing need to get up and move.   Past Medical History  Diagnosis Date  . B12 DEFICIENCY   . Rosacea   . Allergic urticaria   . ARTHRALGIA   . LOW BACK PAIN   . FIBROMYALGIA   . GERD   . CFS (chronic fatigue syndrome)     Review of Systems  Constitutional: Positive for fatigue. Negative for fever and chills.  Respiratory: Negative for cough and shortness of breath.   Cardiovascular: Negative for chest pain and palpitations.       Objective:   Physical Exam  BP 102/70  Pulse 62  Temp(Src) 98.9 F (37.2 C) (Oral)  SpO2 99% Constitutional: She is appears well-developed and well-nourished. No distress. nontoxic HENT: Head: Normocephalic and atraumatic. Ears: nontender, benign external exam. B TMs hazy but no erythema; L without effusion; Nose: Nose normal.  Mouth/Throat: Oropharynx is clear and moist. No oropharyngeal exudate.  Eyes: Conjunctivae and EOM are normal. Pupils are equal, round, and reactive to light. No scleral icterus.  Neck: Normal range of motion. Neck supple. No LAD or JVD present. No thyromegaly present.  Cardiovascular: Normal rate, regular rhythm and normal heart sounds.  No murmur heard. No BLE edema. Pulmonary/Chest: Effort normal and breath sounds normal. No  respiratory distress. She has no wheezes. Psychiatric: She has a normal mood and affect. Her behavior is normal. Judgment and thought content normal.   Lab Results  Component Value Date   WBC 7.2 11/13/2010   HGB 12.8 11/13/2010   HCT 37.9 11/13/2010   PLT 312.0 11/13/2010   GLUCOSE 94 11/13/2010   CHOL 174 03/18/2010   TRIG 48.0 03/18/2010   HDL 54.50 03/18/2010   LDLCALC 110* 03/18/2010   ALT 11 11/13/2010   AST 17 11/13/2010   NA 137 11/13/2010   K 3.7 11/13/2010   CL 102 11/13/2010   CREATININE 0.7 11/13/2010   BUN 11 11/13/2010   CO2 28 11/13/2010   TSH 1.42 03/18/2010   Lab Results  Component Value Date   VITAMINB12 601 03/18/2010   No results found for this basename: IRON, TIBC, FERRITIN   Lab Results  Component Value Date   ANA POS* 06/26/2009        Assessment & Plan:  Fatigue - intermittently positive ANA - refer to Regina [name] for autoimmune eval as requested- check labs  RLS - declines meds - check labs for iron defic  L ear ache - ? Eustachian tube dysfx - improved briefly with pred pak - declines nasal steroids - refer to ENT  R foot ankle pain - s/p podiatry intervention 2008 - continued pain symptoms - refer to ortho for opinion on same  Time spent with pt today 25 minutes, greater than 50% time  spent counseling patient on fatigue, positive ANA history and ear pain . Also medication and prior lab results review

## 2011-02-03 NOTE — Patient Instructions (Signed)
It was good to see you today. we'll make referral to ENT, rheumatology and foot specialist. Our office will contact you regarding appointment(s) once made. Test(s) ordered today. Your results will be called to you after review (48-72hours after test completion). If any changes need to be made, you will be notified at that time. Work note provided as requested for this week

## 2011-06-22 ENCOUNTER — Telehealth: Payer: Self-pay | Admitting: Internal Medicine

## 2011-06-22 NOTE — Telephone Encounter (Signed)
Patient states she had left side upper abdominal pain on Friday. She had a normal bowel movement then started with stomach cramping. Several hours later, she had vomiting x 2 and severe left sided pain. After vomiting, she felt better. On Saturday, she continued to have left upper abdominal pain that is worse when she eats. She report having "stomach problems" for the last couple of months. States she has gained 15 lbs since Christmas. She would like to schedule an OV. Scheduled patient with Willette Cluster, NP on 06/23/11 at 2:00PM.

## 2011-06-22 NOTE — Telephone Encounter (Signed)
Left a message for patient to call me. 

## 2011-06-23 ENCOUNTER — Encounter: Payer: Self-pay | Admitting: Nurse Practitioner

## 2011-06-23 ENCOUNTER — Ambulatory Visit (INDEPENDENT_AMBULATORY_CARE_PROVIDER_SITE_OTHER): Payer: BC Managed Care – PPO | Admitting: Nurse Practitioner

## 2011-06-23 ENCOUNTER — Other Ambulatory Visit (INDEPENDENT_AMBULATORY_CARE_PROVIDER_SITE_OTHER): Payer: BC Managed Care – PPO

## 2011-06-23 VITALS — BP 100/60 | HR 88 | Ht 64.0 in | Wt 154.0 lb

## 2011-06-23 DIAGNOSIS — R112 Nausea with vomiting, unspecified: Secondary | ICD-10-CM

## 2011-06-23 DIAGNOSIS — R1012 Left upper quadrant pain: Secondary | ICD-10-CM

## 2011-06-23 LAB — CBC WITH DIFFERENTIAL/PLATELET
Basophils Absolute: 0 10*3/uL (ref 0.0–0.1)
Eosinophils Absolute: 0 10*3/uL (ref 0.0–0.7)
HCT: 36.5 % (ref 36.0–46.0)
Hemoglobin: 12.2 g/dL (ref 12.0–15.0)
Lymphs Abs: 2.2 10*3/uL (ref 0.7–4.0)
MCHC: 33.4 g/dL (ref 30.0–36.0)
MCV: 89.7 fl (ref 78.0–100.0)
Monocytes Absolute: 0.6 10*3/uL (ref 0.1–1.0)
Monocytes Relative: 7.6 % (ref 3.0–12.0)
Neutro Abs: 4.8 10*3/uL (ref 1.4–7.7)
Platelets: 286 10*3/uL (ref 150.0–400.0)
RDW: 12.8 % (ref 11.5–14.6)

## 2011-06-23 LAB — COMPREHENSIVE METABOLIC PANEL
ALT: 15 U/L (ref 0–35)
Alkaline Phosphatase: 43 U/L (ref 39–117)
Creatinine, Ser: 0.6 mg/dL (ref 0.4–1.2)
GFR: 126.38 mL/min (ref >60.00)
Sodium: 137 mEq/L (ref 135–145)
Total Bilirubin: 0.4 mg/dL (ref 0.3–1.2)
Total Protein: 6.8 g/dL (ref 6.0–8.3)

## 2011-06-23 LAB — TSH: TSH: 1.14 u[IU]/mL (ref 0.35–5.50)

## 2011-06-23 NOTE — Progress Notes (Signed)
06/23/2011 Regina [name] [id number] October 28, 1965   HISTORY OF PRESENT ILLNESS: 1. Patient is a 46 year old female known to Dr. Juanda Chance but not seen here in several years. In 2005 patient was evaluated for upper abdominal discomfort, bloating and nausea. Her ultrasound and HIDA scan were unremarkable. Upper endoscopy February 2005 showed gastritis, biopsies negative for gastric atrophy (she has B12 deficiency).  Antiparietal cells and anti-intrinsic factor antibodies were negative. Small bowel biopsies were negative for a villous atrophy. She had a colonoscopy June 2002 for hematochezia and abdominal pain. Findings included internal hemorrhoids and proctitis. Rectal biopsies were negative for inflammation or other abnormalities  Patient is here for evaluation of abdominal pain and vomiting. Symptoms started Friday night. She had two episodes of vomiting a hour or so apart. By Saturday nausea had subsided but she then developed LUQ pain. The pain is more of a discomfort and most prominent with meals.  She hasn't had any fevers or diarrhea.  Patient has a tendency toward hard stools which she successfully manages with daily psyllium, chloraphyll, aloe vera, casc[place] sagrada.    Patient takes several supplements. She strongly prefers not to take prescription medication. She does have concerns about a 15 pound weight gain over last 3 months.   Past Medical History  Diagnosis Date  . B12 DEFICIENCY   . Rosacea   . Allergic urticaria   . ARTHRALGIA   . LOW BACK PAIN   . FIBROMYALGIA   . GERD   . CFS (chronic fatigue syndrome)    Past Surgical History  Procedure Date  . Appendectomy   . Abdominal hysterectomy     partial  . Ovarian cyst removal   . Uterine fibroid surgery     reports that she has quit smoking. She has never used smokeless tobacco. She reports that she does not drink alcohol or use illicit drugs. family history is negative for Asthma. Allergies  Allergen Reactions  .  Erythromycin Stearate     REACTION: all mycins  . Indomethacin     REACTION: lupus like illness  . Morphine Sulfate   . Sulfacetamide Sodium       Outpatient Encounter Prescriptions as of 06/23/2011  Medication Sig Dispense Refill  . BREWERS YEAST PO Take by mouth.      . Cholecalciferol (VITAMIN D) 1000 UNITS capsule Take 1,000 Units by mouth daily.        . Cyanocobalamin (VITAMIN B-12 CR) 1000 MCG TBCR Take 1 tablet by mouth daily.        . fish oil-omega-3 fatty acids 1000 MG capsule Take 2 g by mouth daily.      Marland Kitchen guaifenesin (HUMIBID E) 400 MG TABS Take 2 tablets (800 mg total) by mouth every 4 (four) hours as needed.  60 tablet  0  . DISCONTD: loratadine (CLARITIN) 10 MG tablet Take 10 mg by mouth daily.      Marland Kitchen DISCONTD: naproxen (NAPROSYN) 500 MG tablet Take 500 mg by mouth 2 (two) times daily with a meal.         REVIEW OF SYSTEMS  : Positive for back pain, vision changes, fatigue, headache, hearing problems, heart murmur, itching, muscle pain, sleeping problems. All other systems reviewed and negative except where noted in the History of Present Illness.   PHYSICAL EXAM: BP 100/60  Pulse 88  Ht 5\' 4"  (1.626 m)  Wt 154 lb (69.854 kg)  BMI 26.43 kg/m2 General: Well developed white female in no acute distress Head: Normocephalic and atraumatic Eyes:  sclerae anicteric,conjunctive pink. Ears: Normal auditory acuity Mouth: No deformity or lesions Neck: Supple, no masses.  Lungs: Clear throughout to auscultation Heart: Regular rate and rhythm; no murmurs heard Abdomen: Soft, non distended, mild LUQ tenderness. No masses or hepatomegaly noted. Normal Bowel sounds Rectal: not done Musculoskeletal: Symmetrical with no gross deformities  Skin: No lesions on visible extremities Extremities: No edema or deformities noted Neurological: Alert oriented, grossly nonfocal Cervical Nodes:  No significant cervical adenopathy Psychological:  Alert and cooperative. Normal mood and  affect  ASSESSMENT AND PLAN; 1. Acute nausea and vomiting, resolved. Now with left upper quadrant discomfort. The nausea and vomiting may have been viral, left upper quadrant discomfort may be musculoskeletal pain from nausea and vomiting but then not sure why discomfort would be worse with meals. Overall patient feels okay, she would rather not try Lidoderm patches or muscle relaxer but rather give it a few days. Will check some basic labs and call her with the results. Depending on how she feels and / or lab results we may need to proceed with further workup.  2. Colon cancer screening, her last colonoscopy was June 2002. She should be due for repeat screening, will check with Dr. Juanda Chance, patient's primary GI, and then let the patient know.   3. Fibromyalgia, arthralgias. She is under the care of rheumatology.

## 2011-06-23 NOTE — Progress Notes (Signed)
i agree with the plan outlined in this noteguien

## 2011-06-23 NOTE — Telephone Encounter (Signed)
OK 

## 2011-06-23 NOTE — Patient Instructions (Signed)
Please go to the basement level to have your labs drawn.   We will call you with the lab results.    Willette Cluster ACNP will talk to Dr. Juanda Chance about your next colonoscopy date.  We will call you about that also.

## 2011-07-02 ENCOUNTER — Telehealth: Payer: Self-pay | Admitting: *Deleted

## 2011-07-02 NOTE — Telephone Encounter (Signed)
I called the patient to advise her lab results were all normal per Willette Cluster ACNP.  I also called to assist her in scheduling her Previsit and Colonoscopy when she is ready.  She thanked me for calling and said as soon as she knows about her vacation dates with her daughter she will call us.  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  She said she would call me back to schedule.

## 2011-08-03 ENCOUNTER — Telehealth: Payer: Self-pay | Admitting: *Deleted

## 2011-08-03 NOTE — Telephone Encounter (Signed)
LM for patient to call us back regarding scheduling her next Previsit and colonoscopy.  I advised her to call [phone] and our scheduler would be glad to help her with scheduling the procedure. I advised she can ask for me if she would like.

## 2011-10-20 ENCOUNTER — Encounter: Payer: Self-pay | Admitting: Internal Medicine

## 2011-10-27 ENCOUNTER — Telehealth: Payer: Self-pay | Admitting: Internal Medicine

## 2011-10-27 NOTE — Telephone Encounter (Signed)
Spoke with patient and she states she is having a problem with feeling full in abdomen and weight gain. States Dr. Corliss Skains has seen her and done some tests which were negative. She would like to schedule an OV prior to her procedure with Dr.Brodie. Offered an OV with extendr but she prefers Dr. Juanda Chance. Scheduled patient on 11/17/11 at 2:30 PM. Patient will have Dr. Corliss Skains to fax over records.

## 2011-11-04 ENCOUNTER — Encounter: Payer: Self-pay | Admitting: *Deleted

## 2011-11-12 ENCOUNTER — Encounter: Payer: Self-pay | Admitting: Internal Medicine

## 2011-11-17 ENCOUNTER — Encounter: Payer: Self-pay | Admitting: Internal Medicine

## 2011-11-17 ENCOUNTER — Other Ambulatory Visit (INDEPENDENT_AMBULATORY_CARE_PROVIDER_SITE_OTHER): Payer: BC Managed Care – PPO

## 2011-11-17 ENCOUNTER — Ambulatory Visit (INDEPENDENT_AMBULATORY_CARE_PROVIDER_SITE_OTHER): Payer: BC Managed Care – PPO | Admitting: Internal Medicine

## 2011-11-17 VITALS — BP 100/60 | HR 80 | Ht 64.0 in | Wt 158.0 lb

## 2011-11-17 DIAGNOSIS — R198 Other specified symptoms and signs involving the digestive system and abdomen: Secondary | ICD-10-CM

## 2011-11-17 DIAGNOSIS — R1013 Epigastric pain: Secondary | ICD-10-CM

## 2011-11-17 DIAGNOSIS — K3189 Other diseases of stomach and duodenum: Secondary | ICD-10-CM

## 2011-11-17 LAB — CBC WITH DIFFERENTIAL/PLATELET
Basophils Absolute: 0 10*3/uL (ref 0.0–0.1)
HCT: 37.7 % (ref 36.0–46.0)
Lymphs Abs: 2.3 10*3/uL (ref 0.7–4.0)
Monocytes Relative: 7.7 % (ref 3.0–12.0)
Neutrophils Relative %: 57.8 % (ref 43.0–77.0)
Platelets: 281 10*3/uL (ref 150.0–400.0)
RDW: 13.1 % (ref 11.5–14.6)

## 2011-11-17 LAB — URINALYSIS, ROUTINE W REFLEX MICROSCOPIC
Specific Gravity, Urine: 1.015 (ref 1.000–1.030)
Total Protein, Urine: NEGATIVE
Urine Glucose: NEGATIVE

## 2011-11-17 LAB — COMPREHENSIVE METABOLIC PANEL
ALT: 16 U/L (ref 0–35)
Alkaline Phosphatase: 42 U/L (ref 39–117)
CO2: 28 mEq/L (ref 19–32)
GFR: 104.04 mL/min (ref >60.00)
Sodium: 138 mEq/L (ref 135–145)
Total Bilirubin: 0.6 mg/dL (ref 0.3–1.2)
Total Protein: 7.3 g/dL (ref 6.0–8.3)

## 2011-11-17 MED ORDER — ALIGN 4 MG PO CAPS: 1.0000 | ORAL_CAPSULE | Freq: Every day | ORAL | Status: DC

## 2011-11-17 MED ORDER — MOVIPREP 100 G PO SOLR: ORAL | Status: DC

## 2011-11-17 MED ORDER — FLUCONAZOLE 100 MG PO TABS: 100.0000 mg | ORAL_TABLET | Freq: Every day | ORAL | Status: AC

## 2011-11-17 MED ORDER — AMBULATORY NON FORMULARY MEDICATION: Status: DC

## 2011-11-17 NOTE — Progress Notes (Signed)
Regina [name] Jun 19, 1965 MRN [id number]   History of Present Illness:  This is a 46 year old white female with a diagnosis of fibromyalgia and history of irritable bowel syndrome. I have known her for 30 years and she has had several GI workups which included a colonoscopy in 1990 and again in 2002 showing no evidence of inflammatory bowel disease. An upper endoscopy in 2005 showed normal small bowel mucosa without villous atrophy. She has gained 30 pounds since December 2012. She is concerned about it because she insists that her eating habits are the same as well as her level of activity. She runs 4 miles 3 times a week. Her appetite has been good but her bowel habits have changed to constipation. She saw Willette Cluster, NP in March 2013 for left upper quadrant abdominal pain and weight gain. An upper abdominal ultrasound in 2005 was negative. Her HIDA scan was normal. She is currently on no medications.   Past Medical History  Diagnosis Date  . B12 DEFICIENCY   . Rosacea   . Allergic urticaria   . ARTHRALGIA   . Osteoarthritis   . Gastritis   . GERD   . CFS (chronic fatigue syndrome)   . Fibromyalgia   . Internal hemorrhoids   . MVP (mitral valve prolapse)   . History of endometriosis    Past Surgical History  Procedure Date  . Appendectomy   . Abdominal hysterectomy     partial  . Ovarian cyst removal   . Uterine fibroid surgery   . Hemorrhoid surgery     Dr. Zachery Dakins 2009  . Anal fissure repair     Dr Zachery Dakins 2009  . Tubal ligation     reports that she has quit smoking. She has never used smokeless tobacco. She reports that she does not drink alcohol or use illicit drugs. family history includes Diabetes in some unspecified family members.  There is no history of Asthma. Allergies  Allergen Reactions  . Erythromycin Stearate     REACTION: all mycins  . Indomethacin     REACTION: lupus like illness  . Morphine Sulfate   . Sulfacetamide Sodium          Review of Systems: Occasional dysphagia. Constipation. Negative for rectal bleeding  The remainder of the 10 point ROS is negative except as outlined in H&P   Physical Exam: General appearance  Well developed, in no distress. Eyes- non icteric. HEENT nontraumatic, normocephalic. Mouth no lesions, tongue papillated, no cheilosis. Neck supple without adenopathy, thyroid not enlarged, no carotid bruits, no JVD. Lungs Clear to auscultation bilaterally. Cor normal S1, normal S2, regular rhythm, no murmur,  quiet precordium. Abdomen: Soft tender in left lower quadrant. Liver edge at costal margin. No ascites normal active bowel sounds. Rectal: Hemoccult negative stool. Extremities no pedal edema. Skin no lesions. Neurological alert and oriented x 3. Psychological normal mood and affect.  Assessment and Plan:  Problem #1 30 pound weight gain without clear explanation. Her TSH is normal. I suspect this is a combination of decreased activity and maintaining the same eating habits.instead of cutting back on the food she eats. She has moved back in with her parents after her divorce I encouraged her to continue to cut back on her eating. We will start her on Tennuate Dospan 75 mg daily for appetite control for a trial of 3 months.  Problem #2 Change in bowel habits toward constipation. Her last colonoscopy was in 2002. We will proceed with a colonoscopy and also  with an upper endoscopy and small bowel biopsies.  Problem #3 We will check her CBC, sedimentation rate and B12 level. We will give her Diflucan 100 mg, one by mouth daily x 3 for suspected yeast vaginitis and will obtain a urinalysis. For suspected UTI. We will also check her upper abdominal ultrasound.   11/17/2011 [name]

## 2011-11-17 NOTE — Patient Instructions (Addendum)
You have been scheduled for an endoscopy and colonoscopy with propofol. Please follow the written instructions given to you at your visit today. Please pick up your prep at the pharmacy within the next 1-3 days. If you use inhalers (even only as needed), please bring them with you on the day of your procedure. Your physician has requested that you go to the basement for the following lab work before leaving today: CBC, CMET, B12, Sed Rate, Urinalysis We have sent the following medications to your pharmacy for you to pick up at your convenience: Tenuate Dospan Diflucan We have given you samples of Align. This puts good bacteria back into your colon. You should take 1 capsule by mouth once daily. If this works well for you, it can be purchased over the counter. You have been scheduled for an abdominal ultrasound at [facility] (1st floor of hospital) on Friday, 11/20/11 at 8:30 am. Please arrive 15 minutes prior to your appointment for registration. Make certain not to have anything to eat or drink 6 hours prior to your appointment. Should you need to reschedule your appointment, please contact radiology at [phone]. CC: Dr Corliss Skains, Dr Posey Rea

## 2011-11-19 ENCOUNTER — Other Ambulatory Visit: Payer: Self-pay | Admitting: *Deleted

## 2011-11-19 DIAGNOSIS — E538 Deficiency of other specified B group vitamins: Secondary | ICD-10-CM

## 2011-11-20 ENCOUNTER — Ambulatory Visit (INDEPENDENT_AMBULATORY_CARE_PROVIDER_SITE_OTHER): Payer: BC Managed Care – PPO | Admitting: Internal Medicine

## 2011-11-20 ENCOUNTER — Ambulatory Visit (HOSPITAL_COMMUNITY)
Admission: RE | Admit: 2011-11-20 | Discharge: 2011-11-20 | Disposition: A | Discharge status: Home / Self Care | Payer: BC Managed Care – PPO | Source: Ambulatory Visit | Attending: Internal Medicine | Admitting: Internal Medicine

## 2011-11-20 DIAGNOSIS — R1013 Epigastric pain: Secondary | ICD-10-CM | POA: Insufficient documentation

## 2011-11-20 DIAGNOSIS — E538 Deficiency of other specified B group vitamins: Secondary | ICD-10-CM

## 2011-11-20 DIAGNOSIS — R198 Other specified symptoms and signs involving the digestive system and abdomen: Secondary | ICD-10-CM

## 2011-11-20 DIAGNOSIS — K3189 Other diseases of stomach and duodenum: Secondary | ICD-10-CM | POA: Insufficient documentation

## 2011-11-20 MED ORDER — CYANOCOBALAMIN 1000 MCG/ML IJ SOLN
1000.0000 ug | Freq: Once | INTRAMUSCULAR | Status: AC
  Administered 2011-11-20: 1000 ug via INTRAMUSCULAR

## 2011-11-25 ENCOUNTER — Telehealth: Payer: Self-pay | Admitting: *Deleted

## 2011-11-25 NOTE — Telephone Encounter (Signed)
OK to take 1/2 tablet if cleared by pharmacist to break tablets.

## 2011-11-25 NOTE — Telephone Encounter (Signed)
Spoke with patient and told her you cannot break the tablet. She states she now has a headache and she thinks her blood pressure is up. She denies problems with blood pressure in past. Patient instructed to have BP check and seek medical care at ED or Urgent Care if elevated. She will stop Tennuate Dospan.

## 2011-11-25 NOTE — Telephone Encounter (Signed)
Patient was given rx for Tenuate Dospan 75 mg po daily. She took it yesterday and today. She did not sleep at all last night and is very "antsy". Patient advised not to take any more. She is asking if she can take 1/2 tablet. Please, advise.

## 2011-11-25 NOTE — Telephone Encounter (Signed)
Agree. Needs to stop Tennuate Dospan. Can address other treatment options with DB when she returns.

## 2011-11-25 NOTE — Telephone Encounter (Signed)
Spoke with pharmacist and pill should not be broken. Left a message for patient to call me.

## 2011-11-27 ENCOUNTER — Ambulatory Visit (INDEPENDENT_AMBULATORY_CARE_PROVIDER_SITE_OTHER): Payer: BC Managed Care – PPO | Admitting: Internal Medicine

## 2011-11-27 DIAGNOSIS — E538 Deficiency of other specified B group vitamins: Secondary | ICD-10-CM

## 2011-11-27 MED ORDER — CYANOCOBALAMIN 1000 MCG/ML IJ SOLN
1000.0000 ug | Freq: Once | INTRAMUSCULAR | Status: AC
  Administered 2011-11-27: 1000 ug via INTRAMUSCULAR

## 2011-12-01 NOTE — Telephone Encounter (Signed)
Dr. Juanda Chance, just wanted you to be aware of this.

## 2011-12-04 ENCOUNTER — Ambulatory Visit (INDEPENDENT_AMBULATORY_CARE_PROVIDER_SITE_OTHER): Payer: BC Managed Care – PPO | Admitting: Internal Medicine

## 2011-12-04 DIAGNOSIS — E538 Deficiency of other specified B group vitamins: Secondary | ICD-10-CM

## 2011-12-04 MED ORDER — CYANOCOBALAMIN 1000 MCG/ML IJ SOLN
1000.0000 ug | INTRAMUSCULAR | Status: DC
  Administered 2011-12-04: 1000 ug via INTRAMUSCULAR

## 2011-12-04 MED ORDER — CYANOCOBALAMIN 1000 MCG/ML IJ SOLN: 1000.0000 ug | INTRAMUSCULAR | Status: DC

## 2011-12-05 ENCOUNTER — Encounter: Payer: Self-pay | Admitting: Internal Medicine

## 2011-12-05 NOTE — Progress Notes (Signed)
Patient ID: Regina [name], female   DOB: 12/18/1965, 46 y.o.   MRN: [id number]  B12 injection given

## 2011-12-11 ENCOUNTER — Ambulatory Visit (INDEPENDENT_AMBULATORY_CARE_PROVIDER_SITE_OTHER): Payer: BC Managed Care – PPO | Admitting: Internal Medicine

## 2011-12-11 DIAGNOSIS — E538 Deficiency of other specified B group vitamins: Secondary | ICD-10-CM

## 2011-12-11 MED ORDER — CYANOCOBALAMIN 1000 MCG/ML IJ SOLN
1000.0000 ug | INTRAMUSCULAR | Status: DC
  Administered 2011-12-11: 1000 ug via INTRAMUSCULAR

## 2011-12-18 ENCOUNTER — Encounter: Payer: Self-pay | Admitting: Internal Medicine

## 2011-12-18 ENCOUNTER — Ambulatory Visit (AMBULATORY_SURGERY_CENTER): Payer: BC Managed Care – PPO | Admitting: Internal Medicine

## 2011-12-18 ENCOUNTER — Encounter: Payer: Self-pay | Admitting: *Deleted

## 2011-12-18 VITALS — BP 121/71 | HR 95 | Resp 23

## 2011-12-18 DIAGNOSIS — K2990 Gastroduodenitis, unspecified, without bleeding: Secondary | ICD-10-CM

## 2011-12-18 DIAGNOSIS — R1033 Periumbilical pain: Secondary | ICD-10-CM

## 2011-12-18 DIAGNOSIS — K2100 Gastro-esophageal reflux disease with esophagitis, without bleeding: Secondary | ICD-10-CM

## 2011-12-18 DIAGNOSIS — K3189 Other diseases of stomach and duodenum: Secondary | ICD-10-CM

## 2011-12-18 DIAGNOSIS — D1330 Benign neoplasm of unspecified part of small intestine: Secondary | ICD-10-CM

## 2011-12-18 DIAGNOSIS — R1310 Dysphagia, unspecified: Secondary | ICD-10-CM

## 2011-12-18 DIAGNOSIS — K2970 Gastritis, unspecified, without bleeding: Secondary | ICD-10-CM

## 2011-12-18 DIAGNOSIS — R198 Other specified symptoms and signs involving the digestive system and abdomen: Secondary | ICD-10-CM

## 2011-12-18 DIAGNOSIS — R1013 Epigastric pain: Secondary | ICD-10-CM

## 2011-12-18 MED ORDER — OMEPRAZOLE 20 MG PO CPDR: 20.0000 mg | DELAYED_RELEASE_CAPSULE | Freq: Every day | ORAL | Status: DC

## 2011-12-18 MED ORDER — SODIUM CHLORIDE 0.9 % IV SOLN: 500.0000 mL | INTRAVENOUS | Status: DC

## 2011-12-18 MED ORDER — BELLADONNA ALK-PHENOBARBITAL 16.2 MG PO TABS: 1.0000 | ORAL_TABLET | ORAL | Status: DC | PRN

## 2011-12-18 NOTE — Progress Notes (Signed)
1310 pt. Up to bathroom prior to discharge.  States when she walks she has slight pain left groin area.  Dr. Juanda Chance notified per patients Request.  Pt. Has had normal recovery period and is passing gas.  Dr. Juanda Chance advised that pt. Could have perhaps strained muscle  As she was positioned for procedure.  Advised pt. To call if pain is increasing.

## 2011-12-18 NOTE — Op Note (Signed)
[facility] [address] [place], [address]   ENDOSCOPY PROCEDURE REPORT  PATIENT: Regina [name], Regina [name]  MR#: [id number] BIRTHDATE: November 29, 1965 , 46  yrs. old GENDER: Female ENDOSCOPIST: Hart Carwin, MD PROCEDURE DATE:  12/18/2011 PROCEDURE:  EGD w/ biopsy ASA CLASS:     Class I INDICATIONS:  dyspepsia.   dysphagia.   hx of B12 deficiency,. MEDICATIONS: MAC sedation, administered by CRNA and Propofol (Diprivan) 200 mg IV TOPICAL ANESTHETIC: Cetacaine Spray  DESCRIPTION OF PROCEDURE: After the risks benefits and alternatives of the procedure were thoroughly explained, informed consent was obtained.  The LB GIF-H180 [id number] endoscope was introduced through the mouth and advanced to the second portion of the duodenum. Without limitations.  The instrument was slowly withdrawn as the mucosa was fully examined.      ESOPHAGUS: Reflux esophagitis was found at the gastroesophageal junction.  Esophagitis was LA Grade A: breaks across 1-2 folds, <5 mm.  A biopsy was performed using cold forceps.  STOMACH: Mild gastritis (inflammation) was found in the gastric body.  A biopsy was performed using cold forceps.  Sample obtained for helicobacter pylori testing.  Duodenum appeared normal - a biopsy was performed using cold forceps. to r/o sprue  Retroflexed views revealed no abnormalities.     The scope was then withdrawn from the patient and the procedure completed.  COMPLICATIONS: There were no complications. ENDOSCOPIC IMPRESSION: 1.   Esophagitis consistent with reflux esophagitis at the gastroesophageal junction; biopsy 2.   Gastritis (inflammation) was found in the gastric body; biopsy; biopsy , r/o gastric atrophy ( hx of B12 deficiency) 3.   small bowl biopsy  RECOMMENDATIONS: 1.  Await pathology results 2.  continue PPI  REPEAT EXAM:  eSigned:  Hart Carwin, MD 12/18/2011 12:18 PM   CC:  PATIENT NAME:  Regina [name], Regina [name] MR#: [id number]

## 2011-12-18 NOTE — Op Note (Signed)
[facility] [address] [place], [address]   COLONOSCOPY PROCEDURE REPORT  PATIENT: Regina [name], Regina [name]  MR#: [id number] BIRTHDATE: 1965/04/18 , 46  yrs. old GENDER: Female ENDOSCOPIST: Hart Carwin, MD PROCEDURE DATE:  12/18/2011 PROCEDURE:   Colonoscopy, diagnostic ASA CLASS:   Class I INDICATIONS:periumbilical abdominal pain, change in bowel habits, and constipation.  last colon 2002- ? colitis, biopsies did not confirm colitis MEDICATIONS: MAC sedation, administered by CRNA and Propofol (Diprivan) 150 mg IV  DESCRIPTION OF PROCEDURE:   After the risks and benefits and of the procedure were explained, informed consent was obtained.  A digital rectal exam revealed no abnormalities of the rectum.    The LB [id number]-H180AL [id number]  endoscope was introduced through the anus and advanced to the cecum, which was identified by both the appendix and ileocecal valve .  The quality of the prep was good, using MoviPrep .  The instrument was then slowly withdrawn as the colon was fully examined.     COLON FINDINGS: A normal appearing cecum, ileocecal valve, and appendiceal orifice were identified.  The ascending, hepatic flexure, transverse, splenic flexure, descending, sigmoid colon and rectum appeared unremarkable.  No polyps or cancers were seen. Retroflexed views revealed no abnormalities.     The scope was then withdrawn from the patient and the procedure completed.  COMPLICATIONS: There were no complications. ENDOSCOPIC IMPRESSION: Normal colon , no evidence of colitis, suspect IBS  RECOMMENDATIONS: High fiber diet Donnatal prn abd. pain Miralax  9 gms prn constipation follow up in the offive   REPEAT EXAM: In 10 year(s)  for Colonoscopy.  cc:  _______________________________ eSignedHart Carwin, MD 12/18/2011 12:24 PM

## 2011-12-18 NOTE — Patient Instructions (Addendum)
NEW MEDICATION:OMEPRAZOLE 20 MG DAILY, 20 MINUTES PRIOR TO BREAKFAST. DONNATAL 16.2 MG AS NEEDED. MIRALAX AS NEEDED FOR CONSTIPATION.  HIGH FIBER DIET.  CALL DR. Regino Schultze OFFICE FOR AN APPOINTMENT IN 1-2 MONTHS, [phone].     YOU HAD AN ENDOSCOPIC PROCEDURE TODAY AT THE [facility]: Refer to the procedure report that was given to you for any specific questions about what was found during the examination.  If the procedure report does not answer your questions, please call your gastroenterologist to clarify.  If you requested that your care partner not be given the details of your procedure findings, then the procedure report has been included in a sealed envelope for you to review at your convenience later.  YOU SHOULD EXPECT: Some feelings of bloating in the abdomen. Passage of more gas than usual.  Walking can help get rid of the air that was put into your GI tract during the procedure and reduce the bloating. If you had a lower endoscopy (such as a colonoscopy or flexible sigmoidoscopy) you may notice spotting of blood in your stool or on the toilet paper. If you underwent a bowel prep for your procedure, then you may not have a normal bowel movement for a few days.  DIET: Your first meal following the procedure should be a light meal and then it is ok to progress to your normal diet.  A half-sandwich or bowl of soup is an example of a good first meal.  Heavy or fried foods are harder to digest and may make you feel nauseous or bloated.  Likewise meals heavy in dairy and vegetables can cause extra gas to form and this can also increase the bloating.  Drink plenty of fluids but you should avoid alcoholic beverages for 24 hours.  ACTIVITY: Your care partner should take you home directly after the procedure.  You should plan to take it easy, moving slowly for the rest of the day.  You can resume normal activity the day after the procedure however you should NOT DRIVE or use heavy  machinery for 24 hours (because of the sedation medicines used during the test).    SYMPTOMS TO REPORT IMMEDIATELY: A gastroenterologist can be reached at any hour.  During normal business hours, 8:30 AM to 5:00 PM Monday through Friday, call [phone].  After hours and on weekends, please call the GI answering service at [phone] who will take a message and have the physician on call contact you.   Following lower endoscopy (colonoscopy or flexible sigmoidoscopy):  Excessive amounts of blood in the stool  Significant tenderness or worsening of abdominal pains  Swelling of the abdomen that is new, acute  Fever of 100F or higher  Following upper endoscopy (EGD)  Vomiting of blood or coffee ground material  New chest pain or pain under the shoulder blades  Painful or persistently difficult swallowing  New shortness of breath  Fever of 100F or higher  Black, tarry-looking stools  FOLLOW UP: If any biopsies were taken you will be contacted by phone or by letter within the next 1-3 weeks.  Call your gastroenterologist if you have not heard about the biopsies in 3 weeks.  Our staff will call the home number listed on your records the next business day following your procedure to check on you and address any questions or concerns that you may have at that time regarding the information given to you following your procedure. This is a courtesy call and so if there  is no answer at the home number and we have not heard from you through the emergency physician on call, we will assume that you have returned to your regular daily activities without incident.  SIGNATURES/CONFIDENTIALITY: You and/or your care partner have signed paperwork which will be entered into your electronic medical record.  These signatures attest to the fact that that the information above on your After Visit Summary has been reviewed and is understood.  Full responsibility of the confidentiality of this discharge  information lies with you and/or your care-partner.   Polyp and high fiber diet information given.

## 2011-12-18 NOTE — Progress Notes (Signed)
Patient did not experience any of the following events: a burn prior to discharge; a fall within the facility; wrong site/side/patient/procedure/implant event; or a hospital transfer or hospital admission upon discharge from the facility. (G8907) Patient did not have preoperative order for IV antibiotic SSI prophylaxis. (G8918)  

## 2011-12-18 NOTE — Progress Notes (Signed)
Pressure applied to the abdomen to reach the cecum 

## 2011-12-21 ENCOUNTER — Telehealth: Payer: Self-pay | Admitting: *Deleted

## 2011-12-21 NOTE — Telephone Encounter (Signed)
  Follow up Call-  Call back number 12/18/2011  Post procedure Call Back phone  # [phone]  Permission to leave phone message Yes     Patient questions:  Do you have a fever, pain , or abdominal swelling? no Pain Score  0 *  Have you tolerated food without any problems? yes  Have you been able to return to your normal activities? yes  Do you have any questions about your discharge instructions: Diet   no Medications  no Follow up visit  no  Do you have questions or concerns about your Care? no  Actions: * If pain score is 4 or above: No action needed, pain <4.

## 2011-12-22 ENCOUNTER — Encounter: Payer: Self-pay | Admitting: Internal Medicine

## 2012-01-08 ENCOUNTER — Ambulatory Visit (INDEPENDENT_AMBULATORY_CARE_PROVIDER_SITE_OTHER): Payer: BC Managed Care – PPO | Admitting: Internal Medicine

## 2012-01-08 ENCOUNTER — Encounter: Payer: Self-pay | Admitting: Internal Medicine

## 2012-01-08 VITALS — BP 112/62 | HR 88 | Ht 64.0 in | Wt 155.0 lb

## 2012-01-08 DIAGNOSIS — R109 Unspecified abdominal pain: Secondary | ICD-10-CM

## 2012-01-08 DIAGNOSIS — R198 Other specified symptoms and signs involving the digestive system and abdomen: Secondary | ICD-10-CM

## 2012-01-08 DIAGNOSIS — K589 Irritable bowel syndrome without diarrhea: Secondary | ICD-10-CM

## 2012-01-08 DIAGNOSIS — R194 Change in bowel habit: Secondary | ICD-10-CM

## 2012-01-08 NOTE — Patient Instructions (Addendum)
You have been scheduled for a CT scan of the abdomen and pelvis at [facility] ([address]---this is in the same building as Architectural technologist).   You are scheduled on Wednesday, 01/13/12 at 4:30 pm. You should arrive 15 minutes prior to your appointment time for registration. Please follow the written instructions below on the day of your exam:  WARNING: IF YOU ARE ALLERGIC TO IODINE/X-RAY DYE, PLEASE NOTIFY RADIOLOGY IMMEDIATELY AT [phone]! YOU WILL BE GIVEN A 13 HOUR PREMEDICATION PREP.  1) Do not eat or drink anything after 12:30 pm (4 hours prior to your test) 2) You have been given 2 bottles of oral contrast to drink. The solution may taste better if refrigerated, but do NOT add ice or any other liquid to this solution. Shake well before drinking.    Drink 1 bottle of contrast @ 2:30 pm (2 hours prior to your exam)  Drink 1 bottle of contrast @ 3:30 pm (1 hour prior to your exam)  You may take any medications as prescribed with a small amount of water except for the following: Metformin, Glucophage, Glucovance, Avandamet, Riomet, Fortamet, Actoplus Met, Janumet, Glumetza or Metaglip. The above medications must be held the day of the exam AND 48 hours after the exam.  The purpose of you drinking the oral contrast is to aid in the visualization of your intestinal tract. The contrast solution may cause some diarrhea. Before your exam is started, you will be given a small amount of fluid to drink. Depending on your individual set of symptoms, you may also receive an intravenous injection of x-ray contrast/dye. Plan on being at [facility] for 30 minutes or long, depending on the type of exam you are having performed.  If you have any questions regarding your exam or if you need to reschedule, you may call the CT department at [phone] between the hours of 8:00 am and 5:00 pm,  Monday-Friday.  ________________________________________________________________________ Please purchase magnesium oxide over the counter and take 1-2 tablets daily as needed.  We have written a letter to your employer. CC: Dr Posey Rea

## 2012-01-08 NOTE — Progress Notes (Signed)
Regina Traub Loggins 10-Apr-1965 MRN [id number]   History of Present Illness:  This is a 46 year old white female with dyspepsia, irritable bowel syndrome and fibromyalgia. She has had extensive GI evaluations in the past starting in the 1990s. She most recently underwent an upper endoscopy and colonoscopy in September 2013 with findings of mild gastritis and esophagitis. There was no evidence of inflammatory bowel disease. She has predominant constipation. She misses work because of abdominal pain. 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 She is not able to eat in between classes which interferes with her irritable bowel syndrome. She has been on B12 supplements. Her last B12 level was low at 182. Her last gallbladder evaluation was in 2005 when she had a negative ultrasound of the abdomen and negative HIDA scan.   Past Medical History  Diagnosis Date  . B12 DEFICIENCY   . Rosacea   . Allergic urticaria   . ARTHRALGIA   . Osteoarthritis   . Gastritis   . GERD   . CFS (chronic fatigue syndrome)   . Fibromyalgia   . Internal hemorrhoids   . MVP (mitral valve prolapse)   . History of endometriosis    Past Surgical History  Procedure Date  . Appendectomy   . Abdominal hysterectomy     partial  . Ovarian cyst removal   . Uterine fibroid surgery   . Hemorrhoid surgery     Dr. Zachery Dakins 2009  . Anal fissure repair     Dr Zachery Dakins 2009  . Tubal ligation   . Rt bunionectomt   . Tonsillectomy and adenoidectomy     reports that she has quit smoking. She has never used smokeless tobacco. She reports that she does not drink alcohol or use illicit drugs. family history includes Diabetes in an unspecified family member; Lung cancer in her mother; and Thyroid cancer in her mother.  There is no history of Asthma. Allergies  Allergen Reactions  . Erythromycin Stearate     REACTION: all mycins  . Indomethacin    REACTION: lupus like illness  . Morphine Sulfate   . Sulfacetamide Sodium         Review of Systems: Denies heartburn dysphagia odynophagia. Positive for abdominal pain, positive for intentional weight loss of 7 pounds  The remainder of the 10 point ROS is negative except as outlined in H&P   Physical Exam: General appearance  Well developed, in no distress. Eyes- non icteric. HEENT nontraumatic, normocephalic. Mouth no lesions, tongue papillated, no cheilosis. Neck supple without adenopathy, thyroid not enlarged, no carotid bruits, no JVD. Lungs Clear to auscultation bilaterally. Cor normal S1, normal S2, regular rhythm, no murmur,  quiet precordium. Abdomen: Tender abdomen mostly in epigastrium and right lower quadrant. No palpable mass. Normoactive bowel sounds. No distention. Rectal: Not done. Extremities no pedal edema. Skin no lesions. Neurological alert and oriented x 3. Psychological normal mood and affect.  Assessment and Plan: Problem #1 Irritable bowel syndrome with predominant constipation, dyspepsia. She has had an essentially negative GI workup. I think stress has a lot to do with her symptoms. I request that she changes her job to where she doesn't have to stay in the class room. She needs to be able to use the restroom at her convenience. She also needs to eat frequent small feedings during the day. We will make a statement to that affect. We will proceed with a CT scan  of the abdomen and pelvis to rule out any mesenteric adenopathy, pelvic cysts or tumors. We will also look at her gallbladder. She is to continue on Prilosec 20 mg a day as well as probiotics and psyllium.    01/08/2012 [name]

## 2012-01-13 ENCOUNTER — Ambulatory Visit (INDEPENDENT_AMBULATORY_CARE_PROVIDER_SITE_OTHER)
Admission: RE | Admit: 2012-01-13 | Discharge: 2012-01-13 | Disposition: A | Discharge status: Home / Self Care | Payer: BC Managed Care – PPO | Source: Ambulatory Visit | Attending: Internal Medicine | Admitting: Internal Medicine

## 2012-01-13 DIAGNOSIS — R194 Change in bowel habit: Secondary | ICD-10-CM

## 2012-01-13 DIAGNOSIS — R198 Other specified symptoms and signs involving the digestive system and abdomen: Secondary | ICD-10-CM

## 2012-01-13 DIAGNOSIS — R109 Unspecified abdominal pain: Secondary | ICD-10-CM

## 2012-01-13 MED ORDER — IOHEXOL 300 MG/ML  SOLN
100.0000 mL | Freq: Once | INTRAMUSCULAR | Status: AC | PRN
  Administered 2012-01-13: 100 mL via INTRAVENOUS

## 2012-01-14 ENCOUNTER — Telehealth: Payer: Self-pay | Admitting: Internal Medicine

## 2012-01-14 ENCOUNTER — Ambulatory Visit (INDEPENDENT_AMBULATORY_CARE_PROVIDER_SITE_OTHER): Payer: BC Managed Care – PPO | Admitting: Internal Medicine

## 2012-01-14 DIAGNOSIS — E538 Deficiency of other specified B group vitamins: Secondary | ICD-10-CM

## 2012-01-14 MED ORDER — CYANOCOBALAMIN 1000 MCG/ML IJ SOLN
1000.0000 ug | Freq: Once | INTRAMUSCULAR | Status: AC
  Administered 2012-01-14: 1000 ug via INTRAMUSCULAR

## 2012-01-14 NOTE — Telephone Encounter (Signed)
Spoke with Selena Batten in radiology. She talked with Jeananne Rama PA and it is unlikely these symptoms are due to contrast If patient had itching, hives or SOB it would be more likely due to contrast. Patient aware.

## 2012-02-08 ENCOUNTER — Other Ambulatory Visit (INDEPENDENT_AMBULATORY_CARE_PROVIDER_SITE_OTHER): Payer: BC Managed Care – PPO

## 2012-02-08 ENCOUNTER — Encounter: Payer: Self-pay | Admitting: Internal Medicine

## 2012-02-08 ENCOUNTER — Ambulatory Visit (INDEPENDENT_AMBULATORY_CARE_PROVIDER_SITE_OTHER): Payer: BC Managed Care – PPO | Admitting: Internal Medicine

## 2012-02-08 VITALS — BP 120/52 | HR 76 | Temp 98.6°F | Resp 16 | Wt 157.0 lb

## 2012-02-08 DIAGNOSIS — E538 Deficiency of other specified B group vitamins: Secondary | ICD-10-CM

## 2012-02-08 DIAGNOSIS — K589 Irritable bowel syndrome without diarrhea: Secondary | ICD-10-CM

## 2012-02-08 DIAGNOSIS — IMO0001 Reserved for inherently not codable concepts without codable children: Secondary | ICD-10-CM

## 2012-02-08 DIAGNOSIS — R918 Other nonspecific abnormal finding of lung field: Secondary | ICD-10-CM

## 2012-02-08 DIAGNOSIS — L500 Allergic urticaria: Secondary | ICD-10-CM

## 2012-02-08 LAB — BASIC METABOLIC PANEL
BUN: 11 mg/dL (ref 6–23)
Calcium: 9 mg/dL (ref 8.4–10.5)
GFR: 134.46 mL/min (ref >60.00)
Glucose, Bld: 89 mg/dL (ref 70–99)
Potassium: 3.6 mEq/L (ref 3.5–5.1)

## 2012-02-08 LAB — CBC WITH DIFFERENTIAL/PLATELET
Basophils Absolute: 0 10*3/uL (ref 0.0–0.1)
Eosinophils Relative: 0.2 % (ref 0.0–5.0)
HCT: 38 % (ref 36.0–46.0)
Lymphocytes Relative: 27.6 % (ref 12.0–46.0)
Lymphs Abs: 2.5 10*3/uL (ref 0.7–4.0)
Monocytes Relative: 6.1 % (ref 3.0–12.0)
Neutrophils Relative %: 65.8 % (ref 43.0–77.0)
Platelets: 309 10*3/uL (ref 150.0–400.0)
RDW: 13.1 % (ref 11.5–14.6)
WBC: 9 10*3/uL (ref 4.5–10.5)

## 2012-02-08 LAB — URINALYSIS
Bilirubin Urine: NEGATIVE
Leukocytes, UA: NEGATIVE
Nitrite: NEGATIVE
Specific Gravity, Urine: 1.025 (ref 1.000–1.030)
Total Protein, Urine: NEGATIVE
pH: 6 (ref 5.0–8.0)

## 2012-02-08 LAB — VITAMIN B12: Vitamin B-12: 396 pg/mL (ref 211–911)

## 2012-02-08 LAB — HEPATIC FUNCTION PANEL
ALT: 16 U/L (ref 0–35)
Alkaline Phosphatase: 51 U/L (ref 39–117)
Bilirubin, Direct: 0.1 mg/dL (ref 0.0–0.3)
Total Protein: 7.2 g/dL (ref 6.0–8.3)

## 2012-02-08 NOTE — Assessment & Plan Note (Signed)
Labs

## 2012-02-08 NOTE — Progress Notes (Signed)
  Subjective:    Patient ID: Regina [name], female    DOB: 10-10-1965, 46 y.o.   MRN: [id number]  HPI  C/o FMS, IBS, fatigue x long time all the time C/o wt gain F/u Vit B12 F/u abn chest findings on abd CT F/o ovar cyst - resolved  BP Readings from Last 3 Encounters:  02/08/12 120/52  01/08/12 112/62  12/18/11 121/71   Wt Readings from Last 3 Encounters:  02/08/12 157 lb (71.215 kg)  01/08/12 155 lb (70.308 kg)  11/17/11 158 lb (71.668 kg)      Review of Systems  Constitutional: Positive for fatigue and unexpected weight change. Negative for chills, activity change and appetite change.  HENT: Negative for congestion, mouth sores and sinus pressure.   Eyes: Negative for visual disturbance.  Respiratory: Positive for wheezing. Negative for cough and chest tightness.   Gastrointestinal: Positive for constipation and abdominal distention. Negative for nausea, abdominal pain and diarrhea.  Genitourinary: Negative for dysuria, frequency, difficulty urinating, genital sores and vaginal pain.  Musculoskeletal: Negative for back pain and gait problem.  Skin: Negative for pallor and rash.  Neurological: Negative for dizziness, tremors, weakness, light-headedness, numbness and headaches.  Psychiatric/Behavioral: Negative for suicidal ideas, confusion and sleep disturbance. The patient is nervous/anxious.        Objective:   Physical Exam  Constitutional: She appears well-developed. No distress.  HENT:  Head: Normocephalic.  Right Ear: External ear normal.  Left Ear: External ear normal.  Nose: Nose normal.  Mouth/Throat: Oropharynx is clear and moist.  Eyes: Conjunctivae normal are normal. Pupils are equal, round, and reactive to light. Right eye exhibits no discharge. Left eye exhibits no discharge.  Neck: Normal range of motion. Neck supple. No JVD present. No tracheal deviation present. No thyromegaly present.  Cardiovascular: Normal rate, regular rhythm and normal heart  sounds.   Pulmonary/Chest: No stridor. No respiratory distress. She has no wheezes.  Abdominal: Soft. Bowel sounds are normal. She exhibits no distension and no mass. There is no tenderness. There is no rebound and no guarding.  Musculoskeletal: She exhibits no edema and no tenderness.  Lymphadenopathy:    She has no cervical adenopathy.  Neurological: She displays normal reflexes. No cranial nerve deficit. She exhibits normal muscle tone. Coordination normal.  Skin: No rash noted. No erythema.  Psychiatric: She has a normal mood and affect. Her behavior is normal. Judgment and thought content normal.     Lab Results  Component Value Date   WBC 6.9 11/17/2011   HGB 12.6 11/17/2011   HCT 37.7 11/17/2011   PLT 281.0 11/17/2011   GLUCOSE 87 11/17/2011   CHOL 174 03/18/2010   TRIG 48.0 03/18/2010   HDL 54.50 03/18/2010   LDLCALC 110* 03/18/2010   ALT 16 11/17/2011   AST 17 11/17/2011   NA 138 11/17/2011   K 4.3 11/17/2011   CL 105 11/17/2011   CREATININE 0.7 11/17/2011   BUN 14 11/17/2011   CO2 28 11/17/2011   TSH 1.14 06/23/2011        Assessment & Plan:

## 2012-02-08 NOTE — Assessment & Plan Note (Signed)
On CT 01/08/12: IMPRESSION:  4.9 cm low density right adnexal structure probably represents an  ovarian cyst. Due to the size, recommend a 2-3 months follow-up  ultrasound to ensure resolution.  There is concern for two pulmonary nodules. The largest measures  up to 0.7 cm in the right middle lobe. If the patient is at high  risk for bronchogenic carcinoma, follow-up chest CT at 3-6 months  is recommended. If the patient is at low risk for bronchogenic  carcinoma, follow-up chest CT at 6-12 months is recommended. This  recommendation follows the consensus statement: Guidelines for  Management of Small Pulmonary Nodules Detected on CT Scans: A  Statement from the Fleischner Society as published in Radiology  2005; 237:395-400.  Original Report Authenticated By: Richarda Overlie, M.D.       CT in 6-12 mo

## 2012-02-08 NOTE — Assessment & Plan Note (Signed)
Continue with current prescription therapy as reflected on the Med list.  

## 2012-02-08 NOTE — Assessment & Plan Note (Signed)
Continue with current prescription therapy as reflected on the Med list. Labs  

## 2012-02-09 ENCOUNTER — Ambulatory Visit: Payer: BC Managed Care – PPO | Admitting: Internal Medicine

## 2012-02-10 LAB — ALLERGEN FOOD PROFILE SPECIFIC IGE
Apple: <0.1 kU/L
Egg White IgE: <0.1 kU/L
Milk IgE: <0.1 kU/L
Orange: <0.1 kU/L
Peanut IgE: <0.1 kU/L
Shrimp IgE: <0.1 kU/L
Tuna IgE: <0.1 kU/L
Wheat IgE: <0.1 kU/L

## 2012-03-02 LAB — IGG FOOD PANEL
Beef, IgG: 8.2 ug/mL — ABNORMAL HIGH (ref <2.0)
Chicken, IgG: <0.15 ug/mL (ref <0.15)
Egg white, IgG: 13
Egg yolk, IgG: 9.5 ug/mL — ABNORMAL HIGH (ref <2.0)

## 2012-03-10 ENCOUNTER — Ambulatory Visit (INDEPENDENT_AMBULATORY_CARE_PROVIDER_SITE_OTHER): Payer: BC Managed Care – PPO | Admitting: Internal Medicine

## 2012-03-10 ENCOUNTER — Encounter: Payer: Self-pay | Admitting: Internal Medicine

## 2012-03-10 VITALS — BP 100/68 | HR 79 | Temp 98.3°F | Ht 64.0 in

## 2012-03-10 DIAGNOSIS — J209 Acute bronchitis, unspecified: Secondary | ICD-10-CM

## 2012-03-10 DIAGNOSIS — E538 Deficiency of other specified B group vitamins: Secondary | ICD-10-CM

## 2012-03-10 MED ORDER — AMOXICILLIN-POT CLAVULANATE 875-125 MG PO TABS: 1.0000 | ORAL_TABLET | Freq: Two times a day (BID) | ORAL | Status: AC

## 2012-03-10 MED ORDER — CYANOCOBALAMIN 1000 MCG/ML IJ SOLN
1000.0000 ug | Freq: Once | INTRAMUSCULAR | Status: AC
  Administered 2012-03-10: 1000 ug via INTRAMUSCULAR

## 2012-03-10 NOTE — Progress Notes (Signed)
  Subjective:    HPI  complains of cough and cold symptoms -?bronchitis Onset >1 week ago, progressively symptoms  1st associated with rhinorrhea, sneezing, sore throat, mild headache and low grade fever now myalgias and mild-mod chest congestion No relief with OTC meds Precipitated by sick contacts  Past Medical History  Diagnosis Date  . B12 DEFICIENCY   . Rosacea   . Allergic urticaria   . ARTHRALGIA   . Osteoarthritis   . Gastritis   . GERD   . CFS (chronic fatigue syndrome)   . Fibromyalgia   . Internal hemorrhoids   . MVP (mitral valve prolapse)   . History of endometriosis     Review of Systems Constitutional: No fever, no unexpected weight change Pulmonary: No pleurisy or hemoptysis Cardiovascular: No chest pain or palpitations     Objective:   Physical Exam BP 100/68  Pulse 79  Temp 98.3 F (36.8 C) (Oral)  Ht 5\' 4"  (1.626 m)  SpO2 96% GEN: mildly ill appearing and audible head/chest congestion HENT: NCAT, mild sinus tenderness bilaterally, nares with clear discharge, oropharynx mild erythema, no exudate Eyes: Vision grossly intact, no conjunctivitis Lungs: Few scattered rhonchi, no wheeze, no increased work of breathing Cardiovascular: Regular rate and rhythm, no bilateral edema  Lab Results  Component Value Date   WBC 9.0 02/08/2012   HGB 12.7 02/08/2012   HCT 38.0 02/08/2012   PLT 309.0 02/08/2012   GLUCOSE 89 02/08/2012   CHOL 174 03/18/2010   TRIG 48.0 03/18/2010   HDL 54.50 03/18/2010   LDLCALC 110* 03/18/2010   ALT 16 02/08/2012   AST 16 02/08/2012   NA 139 02/08/2012   K 3.6 02/08/2012   CL 105 02/08/2012   CREATININE 0.5 02/08/2012   BUN 11 02/08/2012   CO2 28 02/08/2012   TSH 0.67 02/08/2012      Assessment & Plan:  Viral URI >bronchitis Cough, postnasal drip related to above    Empiric antibiotics prescribed due to symptom duration greater than 7 days OTC cough suppression and decongetant recommended  symptomatic care  with Tylenol or Advil, hydration and rest -  salt gargle advised as needed

## 2012-03-10 NOTE — Patient Instructions (Signed)
It was good to see you today. augmentin antibiotics 2x/day x 1 week - Your prescription(s) have been submitted to your pharmacy. Please take as directed and contact our office if you believe you are having problem(s) with the medication(s). Alternate between ibuprofen and tylenol for aches, pain and fever symptoms as discussed Saline nasal wash, over the counter cough medication and decongestants as needed Hydrate, rest and call if worse or unimproved B12 shot today

## 2012-05-10 ENCOUNTER — Encounter: Payer: Self-pay | Admitting: Internal Medicine

## 2012-05-10 ENCOUNTER — Ambulatory Visit (INDEPENDENT_AMBULATORY_CARE_PROVIDER_SITE_OTHER): Payer: BC Managed Care – PPO | Admitting: Internal Medicine

## 2012-05-10 VITALS — BP 98/68 | HR 68 | Temp 98.8°F | Resp 16 | Wt 159.0 lb

## 2012-05-10 DIAGNOSIS — K219 Gastro-esophageal reflux disease without esophagitis: Secondary | ICD-10-CM

## 2012-05-10 DIAGNOSIS — E538 Deficiency of other specified B group vitamins: Secondary | ICD-10-CM

## 2012-05-10 DIAGNOSIS — K589 Irritable bowel syndrome without diarrhea: Secondary | ICD-10-CM

## 2012-05-10 DIAGNOSIS — M2550 Pain in unspecified joint: Secondary | ICD-10-CM

## 2012-05-10 DIAGNOSIS — IMO0001 Reserved for inherently not codable concepts without codable children: Secondary | ICD-10-CM

## 2012-05-10 MED ORDER — CHOLECALCIFEROL 25 MCG (1000 UT) PO TABS: 1000.0000 [IU] | ORAL_TABLET | Freq: Every day | ORAL | Status: DC

## 2012-05-10 MED ORDER — VITAMIN B-12 1000 MCG SL SUBL: 1.0000 | SUBLINGUAL_TABLET | Freq: Every day | SUBLINGUAL | Status: DC

## 2012-05-10 NOTE — Assessment & Plan Note (Signed)
FMS Gluten free and milk free diet helped

## 2012-05-10 NOTE — Assessment & Plan Note (Signed)
FMS Gluten free and milk free diet helped 

## 2012-05-10 NOTE — Assessment & Plan Note (Signed)
Continue with current prescription therapy as reflected on the Med list.  

## 2012-05-10 NOTE — Progress Notes (Signed)
  Subjective:    Patient ID: Regina [name], female    DOB: [birth year], 47 y.o.   MRN: [id number]  HPI  C/o FMS, IBS, fatigue x long time all the time C/o wt gain F/u Vit B12 F/u abn chest findings on abd CT F/o ovar cyst - resolved  BP Readings from Last 3 Encounters:  05/10/12 98/68  03/10/12 100/68  02/08/12 120/52   Wt Readings from Last 3 Encounters:  05/10/12 159 lb (72.122 kg)  02/08/12 157 lb (71.215 kg)  01/08/12 155 lb (70.308 kg)      Review of Systems  Constitutional: Positive for fatigue and unexpected weight change. Negative for chills, activity change and appetite change.  HENT: Negative for congestion, mouth sores and sinus pressure.   Eyes: Negative for visual disturbance.  Respiratory: Positive for wheezing. Negative for cough and chest tightness.   Gastrointestinal: Positive for constipation and abdominal distention. Negative for nausea, abdominal pain and diarrhea.  Genitourinary: Negative for dysuria, frequency, difficulty urinating, genital sores and vaginal pain.  Musculoskeletal: Negative for back pain and gait problem.  Skin: Negative for pallor and rash.  Neurological: Negative for dizziness, tremors, weakness, light-headedness, numbness and headaches.  Psychiatric/Behavioral: Negative for suicidal ideas, confusion and sleep disturbance. The patient is nervous/anxious.        Objective:   Physical Exam  Constitutional: She appears well-developed. No distress.  HENT:  Head: Normocephalic.  Right Ear: External ear normal.  Left Ear: External ear normal.  Nose: Nose normal.  Mouth/Throat: Oropharynx is clear and moist.  Eyes: Conjunctivae are normal. Pupils are equal, round, and reactive to light. Right eye exhibits no discharge. Left eye exhibits no discharge.  Neck: Normal range of motion. Neck supple. No JVD present. No tracheal deviation present. No thyromegaly present.  Cardiovascular: Normal rate, regular rhythm and normal heart sounds.    Pulmonary/Chest: No stridor. No respiratory distress. She has no wheezes.  Abdominal: Soft. Bowel sounds are normal. She exhibits no distension and no mass. There is no tenderness. There is no rebound and no guarding.  Musculoskeletal: She exhibits no edema and no tenderness.  Lymphadenopathy:    She has no cervical adenopathy.  Neurological: She displays normal reflexes. No cranial nerve deficit. She exhibits normal muscle tone. Coordination normal.  Skin: No rash noted. No erythema.  Psychiatric: She has a normal mood and affect. Her behavior is normal. Judgment and thought content normal.     Lab Results  Component Value Date   WBC 9.0 02/08/2012   HGB 12.7 02/08/2012   HCT 38.0 02/08/2012   PLT 309.0 02/08/2012   GLUCOSE 89 02/08/2012   CHOL 174 03/18/2010   TRIG 48.0 03/18/2010   HDL 54.50 03/18/2010   LDLCALC 110* 03/18/2010   ALT 16 02/08/2012   AST 16 02/08/2012   NA 139 02/08/2012   K 3.6 02/08/2012   CL 105 02/08/2012   CREATININE 0.5 02/08/2012   BUN 11 02/08/2012   CO2 28 02/08/2012   TSH 0.67 02/08/2012        Assessment & Plan:

## 2012-05-10 NOTE — Assessment & Plan Note (Signed)
OTC Prilosec prn

## 2012-06-20 ENCOUNTER — Telehealth: Payer: Self-pay | Admitting: *Deleted

## 2012-06-20 NOTE — Telephone Encounter (Signed)
Message copied by Daphine Deutscher on Mon Jun 20, 2012  8:18 AM ------      Message from: Daphine Deutscher      Created: Thu Nov 19, 2011  4:37 PM       Check to see if patient is due for B12 level rechecked. DB ------

## 2012-06-20 NOTE — Telephone Encounter (Signed)
Left a message for patient to call me. 

## 2012-06-21 ENCOUNTER — Telehealth: Payer: Self-pay | Admitting: *Deleted

## 2012-06-21 DIAGNOSIS — E538 Deficiency of other specified B group vitamins: Secondary | ICD-10-CM

## 2012-06-21 NOTE — Telephone Encounter (Signed)
Spoke with patient and she will come tomorrow for B12 level.

## 2012-06-22 ENCOUNTER — Ambulatory Visit (INDEPENDENT_AMBULATORY_CARE_PROVIDER_SITE_OTHER): Payer: BC Managed Care – PPO

## 2012-06-22 ENCOUNTER — Telehealth: Payer: Self-pay | Admitting: Internal Medicine

## 2012-06-22 ENCOUNTER — Other Ambulatory Visit: Payer: Self-pay | Admitting: *Deleted

## 2012-06-22 DIAGNOSIS — M1990 Unspecified osteoarthritis, unspecified site: Secondary | ICD-10-CM

## 2012-06-22 DIAGNOSIS — E538 Deficiency of other specified B group vitamins: Secondary | ICD-10-CM

## 2012-06-22 NOTE — Telephone Encounter (Signed)
Spoke with patient and she is asking if we will add an ANA to her labs for today. She has not been feeling well and thinks it may be her RA. She would like the lab for Dr. Corliss Skains. Please, advise

## 2012-06-22 NOTE — Telephone Encounter (Signed)
Ok to add ANA to labs today per Dr. Juanda Chance.

## 2012-06-23 ENCOUNTER — Other Ambulatory Visit: Payer: Self-pay | Admitting: *Deleted

## 2012-06-23 DIAGNOSIS — E538 Deficiency of other specified B group vitamins: Secondary | ICD-10-CM

## 2012-07-27 ENCOUNTER — Encounter: Payer: Self-pay | Admitting: Internal Medicine

## 2012-07-27 ENCOUNTER — Ambulatory Visit (INDEPENDENT_AMBULATORY_CARE_PROVIDER_SITE_OTHER): Payer: BC Managed Care – PPO | Admitting: Internal Medicine

## 2012-07-27 VITALS — BP 100/62 | HR 68 | Temp 98.2°F | Wt 163.0 lb

## 2012-07-27 DIAGNOSIS — IMO0001 Reserved for inherently not codable concepts without codable children: Secondary | ICD-10-CM

## 2012-07-27 NOTE — Patient Instructions (Signed)
It was good to see you today. We have reviewed your prior records including labs and tests today I agree with you changing your diet to exclude all gluten and dairy products No treatment changes recommended for your medications at this time Read about SAMe as discussed Followup with Dr. Posey Rea as planned, or call as needed

## 2012-07-27 NOTE — Assessment & Plan Note (Signed)
Chronic symptoms, follows with rheumatology for same Intolerant of traditional medical therapy and medicines for treatment of same Reviewed symptomatic treatment ongoing chiropractic care and consideration of hypnosis therapy Consider SAMe Agree with dietary changes to eliminate all dairy and all gluten as planned

## 2012-07-27 NOTE — Progress Notes (Signed)
  Subjective:    Patient ID: Regina [name], female    DOB: [birth year], 47 y.o.   MRN: [id number]  HPI  Complains of increase in fibromyalgia and chronic fatigue symptoms  Past Medical History  Diagnosis Date  . B12 DEFICIENCY   . Rosacea   . Allergic urticaria   . ARTHRALGIA   . Osteoarthritis   . Gastritis   . GERD   . CFS (chronic fatigue syndrome)   . Fibromyalgia   . Internal hemorrhoids   . MVP (mitral valve prolapse)   . History of endometriosis     Review of Systems  Constitutional: Positive for fatigue. Negative for fever and unexpected weight change.  HENT: Positive for neck pain.   Respiratory: Positive for chest tightness.   Musculoskeletal: Positive for myalgias and back pain. Negative for joint swelling.  Neurological: Positive for headaches.       Objective:   Physical Exam BP 100/62  Pulse 68  Temp(Src) 98.2 F (36.8 C) (Oral)  Wt 163 lb (73.936 kg)  BMI 27.97 kg/m2  SpO2 97% Wt Readings from Last 3 Encounters:  07/27/12 163 lb (73.936 kg)  05/10/12 159 lb (72.122 kg)  02/08/12 157 lb (71.215 kg)   Constitutional: She appears well-developed and well-nourished. No distress.  Cardiovascular: Normal rate, regular rhythm and normal heart sounds.  No murmur heard. No BLE edema. Pulmonary/Chest: Effort normal and breath sounds normal. No respiratory distress. She has no wheezes. Psychiatric: She has a normal mood and affect. Her behavior is normal. Judgment and thought content normal.   Lab Results  Component Value Date   WBC 9.0 02/08/2012   HGB 12.7 02/08/2012   HCT 38.0 02/08/2012   PLT 309.0 02/08/2012   GLUCOSE 89 02/08/2012   CHOL 174 03/18/2010   TRIG 48.0 03/18/2010   HDL 54.50 03/18/2010   LDLCALC 110* 03/18/2010   ALT 16 02/08/2012   AST 16 02/08/2012   NA 139 02/08/2012   K 3.6 02/08/2012   CL 105 02/08/2012   CREATININE 0.5 02/08/2012   BUN 11 02/08/2012   CO2 28 02/08/2012   TSH 0.67 02/08/2012   Lab Results  Component  Value Date   ESRSEDRATE 17 11/17/2011       Assessment & Plan:   See problem list. Medications and labs reviewed today.  Time spent with pt today 25 minutes, greater than 50% time spent counseling patient on fibromyalgia and medication/supplements review. Also review of prior records

## 2012-08-09 ENCOUNTER — Other Ambulatory Visit: Payer: BC Managed Care – PPO

## 2012-08-25 ENCOUNTER — Encounter: Payer: Self-pay | Admitting: Internal Medicine

## 2012-08-25 ENCOUNTER — Ambulatory Visit (INDEPENDENT_AMBULATORY_CARE_PROVIDER_SITE_OTHER): Payer: BC Managed Care – PPO | Admitting: Internal Medicine

## 2012-08-25 VITALS — BP 92/62 | HR 68 | Temp 98.1°F | Resp 16 | Wt 162.0 lb

## 2012-08-25 DIAGNOSIS — IMO0001 Reserved for inherently not codable concepts without codable children: Secondary | ICD-10-CM

## 2012-08-25 DIAGNOSIS — R0609 Other forms of dyspnea: Secondary | ICD-10-CM

## 2012-08-25 DIAGNOSIS — R918 Other nonspecific abnormal finding of lung field: Secondary | ICD-10-CM

## 2012-08-25 DIAGNOSIS — R0683 Snoring: Secondary | ICD-10-CM | POA: Insufficient documentation

## 2012-08-25 DIAGNOSIS — J069 Acute upper respiratory infection, unspecified: Secondary | ICD-10-CM | POA: Insufficient documentation

## 2012-08-25 MED ORDER — CEFUROXIME AXETIL 500 MG PO TABS: ORAL_TABLET | ORAL | Status: DC

## 2012-08-25 MED ORDER — PROMETHAZINE-CODEINE 6.25-10 MG/5ML PO SYRP: 5.0000 mL | ORAL_SOLUTION | ORAL | Status: DC | PRN

## 2012-08-25 NOTE — Assessment & Plan Note (Signed)
Hot tub helped - will write a Rx if needed

## 2012-08-25 NOTE — Assessment & Plan Note (Signed)
5/14 new Sleep test was offered

## 2012-08-25 NOTE — Assessment & Plan Note (Signed)
Prom- cod prn Ceftin x 10 d if worse

## 2012-08-25 NOTE — Assessment & Plan Note (Signed)
Repeat CT is scheduled for 6/14

## 2012-08-25 NOTE — Progress Notes (Signed)
Patient ID: Regina [name], female   DOB: 1965/05/30, 47 y.o.   MRN: [id number]  Subjective:    Patient ID: Regina [name], female    DOB: 06-19-1965, 47 y.o.   MRN: [id number]  HPI C/o URI sx's F/u  FMS, IBS, fatigue x long time all the time  F/u Vit B12 F/u abn chest findings on abd CT F/o ovar cyst - resolved  BP Readings from Last 3 Encounters:  08/25/12 92/62  07/27/12 100/62  05/10/12 98/68   Wt Readings from Last 3 Encounters:  08/25/12 162 lb (73.483 kg)  07/27/12 163 lb (73.936 kg)  05/10/12 159 lb (72.122 kg)      Review of Systems  Constitutional: Positive for fatigue and unexpected weight change. Negative for chills, activity change and appetite change.  HENT: Positive for congestion. Negative for mouth sores and sinus pressure.   Eyes: Negative for visual disturbance.  Respiratory: Positive for cough and wheezing. Negative for chest tightness.   Gastrointestinal: Positive for constipation and abdominal distention. Negative for nausea, abdominal pain and diarrhea.  Genitourinary: Negative for dysuria, frequency, difficulty urinating, genital sores and vaginal pain.  Musculoskeletal: Negative for back pain and gait problem.  Skin: Negative for pallor and rash.  Neurological: Negative for dizziness, tremors, weakness, light-headedness, numbness and headaches.  Psychiatric/Behavioral: Negative for suicidal ideas, confusion and sleep disturbance. The patient is nervous/anxious.        Objective:   Physical Exam  Constitutional: She appears well-developed. No distress.  HENT:  Head: Normocephalic.  Right Ear: External ear normal.  Left Ear: External ear normal.  Nose: Nose normal.  Mouth/Throat: Oropharynx is clear and moist.  Eyes: Conjunctivae are normal. Pupils are equal, round, and reactive to light. Right eye exhibits no discharge. Left eye exhibits no discharge.  Neck: Normal range of motion. Neck supple. No JVD present. No tracheal deviation present.  No thyromegaly present.  Cardiovascular: Normal rate, regular rhythm and normal heart sounds.   Pulmonary/Chest: No stridor. No respiratory distress. She has no wheezes.  Abdominal: Soft. Bowel sounds are normal. She exhibits no distension and no mass. There is no tenderness. There is no rebound and no guarding.  Musculoskeletal: She exhibits no edema and no tenderness.  Lymphadenopathy:    She has no cervical adenopathy.  Neurological: She displays normal reflexes. No cranial nerve deficit. She exhibits normal muscle tone. Coordination normal.  Skin: No rash noted. No erythema.  Psychiatric: She has a normal mood and affect. Her behavior is normal. Judgment and thought content normal.     Lab Results  Component Value Date   WBC 9.0 02/08/2012   HGB 12.7 02/08/2012   HCT 38.0 02/08/2012   PLT 309.0 02/08/2012   GLUCOSE 89 02/08/2012   CHOL 174 03/18/2010   TRIG 48.0 03/18/2010   HDL 54.50 03/18/2010   LDLCALC 110* 03/18/2010   ALT 16 02/08/2012   AST 16 02/08/2012   NA 139 02/08/2012   K 3.6 02/08/2012   CL 105 02/08/2012   CREATININE 0.5 02/08/2012   BUN 11 02/08/2012   CO2 28 02/08/2012   TSH 0.67 02/08/2012        Assessment & Plan:

## 2012-09-07 ENCOUNTER — Ambulatory Visit (INDEPENDENT_AMBULATORY_CARE_PROVIDER_SITE_OTHER)
Admission: RE | Admit: 2012-09-07 | Discharge: 2012-09-07 | Disposition: A | Discharge status: Home / Self Care | Payer: BC Managed Care – PPO | Source: Ambulatory Visit | Attending: Internal Medicine | Admitting: Internal Medicine

## 2012-09-07 ENCOUNTER — Encounter: Payer: Self-pay | Admitting: Internal Medicine

## 2012-09-07 ENCOUNTER — Ambulatory Visit (INDEPENDENT_AMBULATORY_CARE_PROVIDER_SITE_OTHER): Payer: BC Managed Care – PPO | Admitting: Internal Medicine

## 2012-09-07 VITALS — BP 100/58 | HR 70 | Temp 98.5°F | Resp 16 | Wt 163.5 lb

## 2012-09-07 DIAGNOSIS — R5383 Other fatigue: Secondary | ICD-10-CM

## 2012-09-07 DIAGNOSIS — M25569 Pain in unspecified knee: Secondary | ICD-10-CM

## 2012-09-07 DIAGNOSIS — M25561 Pain in right knee: Secondary | ICD-10-CM

## 2012-09-07 DIAGNOSIS — M2550 Pain in unspecified joint: Secondary | ICD-10-CM

## 2012-09-07 DIAGNOSIS — E538 Deficiency of other specified B group vitamins: Secondary | ICD-10-CM

## 2012-09-07 DIAGNOSIS — R918 Other nonspecific abnormal finding of lung field: Secondary | ICD-10-CM

## 2012-09-07 DIAGNOSIS — IMO0001 Reserved for inherently not codable concepts without codable children: Secondary | ICD-10-CM

## 2012-09-07 DIAGNOSIS — R5381 Other malaise: Secondary | ICD-10-CM

## 2012-09-07 NOTE — Patient Instructions (Signed)
Change diet as we discussed

## 2012-09-07 NOTE — Progress Notes (Signed)
   Subjective:     HPI  Complains of increase in fibromyalgia and chronic fatigue symptoms  Past Medical History  Diagnosis Date  . B12 DEFICIENCY   . Rosacea   . Allergic urticaria   . ARTHRALGIA   . Osteoarthritis   . Gastritis   . GERD   . CFS (chronic fatigue syndrome)   . Fibromyalgia   . Internal hemorrhoids   . MVP (mitral valve prolapse)   . History of endometriosis     Review of Systems  Constitutional: Positive for fatigue. Negative for fever and unexpected weight change.  HENT: Positive for neck pain.   Respiratory: Positive for chest tightness.   Musculoskeletal: Positive for myalgias and back pain. Negative for joint swelling.  Neurological: Positive for headaches.       Objective:   Physical Exam BP 100/58  Pulse 70  Temp(Src) 98.5 F (36.9 C) (Oral)  Resp 16  Wt 163 lb 8 oz (74.163 kg)  BMI 28.05 kg/m2  SpO2 97% Wt Readings from Last 3 Encounters:  09/07/12 163 lb 8 oz (74.163 kg)  08/25/12 162 lb (73.483 kg)  07/27/12 163 lb (73.936 kg)   Constitutional: She appears well-developed and well-nourished. No distress.  Cardiovascular: Normal rate, regular rhythm and normal heart sounds.  No murmur heard. No BLE edema. Pulmonary/Chest: Effort normal and breath sounds normal. No respiratory distress. She has no wheezes. Psychiatric: She has a normal mood and affect. Her behavior is normal. Judgment and thought content normal.   Lab Results  Component Value Date   WBC 9.0 02/08/2012   HGB 12.7 02/08/2012   HCT 38.0 02/08/2012   PLT 309.0 02/08/2012   GLUCOSE 89 02/08/2012   CHOL 174 03/18/2010   TRIG 48.0 03/18/2010   HDL 54.50 03/18/2010   LDLCALC 110* 03/18/2010   ALT 16 02/08/2012   AST 16 02/08/2012   NA 139 02/08/2012   K 3.6 02/08/2012   CL 105 02/08/2012   CREATININE 0.5 02/08/2012   BUN 11 02/08/2012   CO2 28 02/08/2012   TSH 0.67 02/08/2012   Lab Results  Component Value Date   ESRSEDRATE 17 11/17/2011       Assessment &  Plan:

## 2012-09-10 ENCOUNTER — Encounter: Payer: Self-pay | Admitting: Internal Medicine

## 2012-09-10 NOTE — Assessment & Plan Note (Signed)
Chest CT today -  Repeat in CT 12 mo

## 2012-09-10 NOTE — Assessment & Plan Note (Signed)
Continue with current prescription therapy as reflected on the Med list.  

## 2012-09-10 NOTE — Assessment & Plan Note (Signed)
Gluten free and milk free diet 

## 2012-09-10 NOTE — Assessment & Plan Note (Signed)
Gluten free and milk free diet

## 2012-10-11 ENCOUNTER — Ambulatory Visit: Payer: BC Managed Care – PPO | Admitting: Sports Medicine

## 2012-11-09 ENCOUNTER — Ambulatory Visit: Payer: BC Managed Care – PPO | Admitting: Sports Medicine

## 2012-12-09 ENCOUNTER — Ambulatory Visit: Payer: BC Managed Care – PPO | Admitting: Internal Medicine

## 2012-12-09 DIAGNOSIS — Z0289 Encounter for other administrative examinations: Secondary | ICD-10-CM

## 2012-12-22 ENCOUNTER — Telehealth: Payer: Self-pay | Admitting: *Deleted

## 2012-12-22 NOTE — Telephone Encounter (Signed)
Left a message for patient to call me. 

## 2012-12-22 NOTE — Telephone Encounter (Signed)
Message copied by Daphine Deutscher on Thu Dec 22, 2012  9:24 AM ------      Message from: Daphine Deutscher      Created: Thu Jun 23, 2012 10:46 AM       Call and remind patient to have B 12 level for DB 12/26/12 ------

## 2012-12-26 NOTE — Telephone Encounter (Signed)
Patient will come for labs.  

## 2012-12-27 ENCOUNTER — Other Ambulatory Visit (INDEPENDENT_AMBULATORY_CARE_PROVIDER_SITE_OTHER): Payer: BC Managed Care – PPO

## 2012-12-27 ENCOUNTER — Telehealth: Payer: Self-pay | Admitting: *Deleted

## 2012-12-27 ENCOUNTER — Telehealth: Payer: Self-pay | Admitting: Internal Medicine

## 2012-12-27 DIAGNOSIS — E538 Deficiency of other specified B group vitamins: Secondary | ICD-10-CM

## 2012-12-27 LAB — VITAMIN B12: Vitamin B-12: 571 pg/mL (ref 211–911)

## 2012-12-27 NOTE — Telephone Encounter (Signed)
Patient came for B12 level. She is asking if she can have a vitamin D panel added to labs. Please, advise.

## 2012-12-27 NOTE — Telephone Encounter (Signed)
Regina [name] , for ordering that. Regina [name]

## 2012-12-27 NOTE — Telephone Encounter (Signed)
Ok Thx 

## 2012-12-27 NOTE — Telephone Encounter (Signed)
Pt went to the lab for a B-12 order from Dr. [name].  She would like to have a Vit D added.

## 2012-12-27 NOTE — Telephone Encounter (Signed)
Add on faxed down to [facility] lab for Vit D lab.

## 2012-12-28 ENCOUNTER — Ambulatory Visit: Payer: BC Managed Care – PPO

## 2012-12-28 DIAGNOSIS — E559 Vitamin D deficiency, unspecified: Secondary | ICD-10-CM

## 2012-12-29 LAB — VITAMIN D 25 HYDROXY (VIT D DEFICIENCY, FRACTURES): Vit D, 25-Hydroxy: 40 ng/mL (ref 30–89)

## 2012-12-30 ENCOUNTER — Ambulatory Visit (INDEPENDENT_AMBULATORY_CARE_PROVIDER_SITE_OTHER): Payer: BC Managed Care – PPO | Admitting: Internal Medicine

## 2012-12-30 ENCOUNTER — Encounter: Payer: Self-pay | Admitting: Internal Medicine

## 2012-12-30 VITALS — BP 118/70 | HR 60 | Temp 97.1°F | Resp 12 | Wt 164.0 lb

## 2012-12-30 DIAGNOSIS — M545 Low back pain: Secondary | ICD-10-CM

## 2012-12-30 DIAGNOSIS — E538 Deficiency of other specified B group vitamins: Secondary | ICD-10-CM

## 2012-12-30 DIAGNOSIS — R5381 Other malaise: Secondary | ICD-10-CM

## 2012-12-30 DIAGNOSIS — K589 Irritable bowel syndrome without diarrhea: Secondary | ICD-10-CM

## 2012-12-30 DIAGNOSIS — M2550 Pain in unspecified joint: Secondary | ICD-10-CM

## 2012-12-30 DIAGNOSIS — IMO0001 Reserved for inherently not codable concepts without codable children: Secondary | ICD-10-CM

## 2012-12-30 MED ORDER — FLAVOCOXID 500 MG PO CAPS: 1.0000 | ORAL_CAPSULE | Freq: Two times a day (BID) | ORAL | Status: DC

## 2012-12-30 MED ORDER — CYCLOBENZAPRINE HCL 5 MG PO TABS: 5.0000 mg | ORAL_TABLET | Freq: Every evening | ORAL | Status: DC | PRN

## 2012-12-30 NOTE — Assessment & Plan Note (Signed)
Continue with current prescription therapy as reflected on the Med list.  

## 2012-12-30 NOTE — Assessment & Plan Note (Signed)
FMS Gluten free and milk free diet helped

## 2012-12-30 NOTE — Assessment & Plan Note (Signed)
Gluten free and milk free diet helped 

## 2012-12-30 NOTE — Patient Instructions (Addendum)
Gluten free trial (no wheat products) for 4-6 weeks. OK to use gluten-free bread and gluten-free pasta.  Milk free trial (no milk, ice cream, cheese and yogurt) for 4-6 weeks. OK to use almond, coconut, rice milk. "Almond breeze" brand tastes good.  

## 2012-12-30 NOTE — Assessment & Plan Note (Signed)
Chronic  FMS Gluten free and milk free diet helped before  Try Flexeril at hs

## 2012-12-30 NOTE — Assessment & Plan Note (Signed)
Discussed.

## 2012-12-30 NOTE — Progress Notes (Signed)
  Subjective:   HPI  F/u FMS, IBS, fatigue x long time all the time F/u wt gain F/u Vit B12 F/u abn chest findings on abd CT F/o ovar cyst - resolved  BP Readings from Last 3 Encounters:  12/30/12 118/70  09/07/12 100/58  08/25/12 92/62   Wt Readings from Last 3 Encounters:  12/30/12 164 lb (74.39 kg)  09/07/12 163 lb 8 oz (74.163 kg)  08/25/12 162 lb (73.483 kg)      Review of Systems  Constitutional: Positive for fatigue and unexpected weight change. Negative for chills, activity change and appetite change.  HENT: Negative for congestion, mouth sores and sinus pressure.   Eyes: Negative for visual disturbance.  Respiratory: Positive for wheezing. Negative for cough and chest tightness.   Gastrointestinal: Positive for constipation and abdominal distention. Negative for nausea, abdominal pain and diarrhea.  Genitourinary: Negative for dysuria, frequency, difficulty urinating, genital sores and vaginal pain.  Musculoskeletal: Negative for back pain and gait problem.  Skin: Negative for pallor and rash.  Neurological: Negative for dizziness, tremors, weakness, light-headedness, numbness and headaches.  Psychiatric/Behavioral: Negative for suicidal ideas, confusion and sleep disturbance. The patient is nervous/anxious.        Objective:   Physical Exam  Constitutional: She appears well-developed. No distress.  HENT:  Head: Normocephalic.  Right Ear: External ear normal.  Left Ear: External ear normal.  Nose: Nose normal.  Mouth/Throat: Oropharynx is clear and moist.  Eyes: Conjunctivae are normal. Pupils are equal, round, and reactive to light. Right eye exhibits no discharge. Left eye exhibits no discharge.  Neck: Normal range of motion. Neck supple. No JVD present. No tracheal deviation present. No thyromegaly present.  Cardiovascular: Normal rate, regular rhythm and normal heart sounds.   Pulmonary/Chest: No stridor. No respiratory distress. She has no wheezes.   Abdominal: Soft. Bowel sounds are normal. She exhibits no distension and no mass. There is no tenderness. There is no rebound and no guarding.  Musculoskeletal: She exhibits no edema and no tenderness.  Lymphadenopathy:    She has no cervical adenopathy.  Neurological: She displays normal reflexes. No cranial nerve deficit. She exhibits normal muscle tone. Coordination normal.  Skin: No rash noted. No erythema.  Psychiatric: She has a normal mood and affect. Her behavior is normal. Judgment and thought content normal.     Lab Results  Component Value Date   WBC 9.0 02/08/2012   HGB 12.7 02/08/2012   HCT 38.0 02/08/2012   PLT 309.0 02/08/2012   GLUCOSE 89 02/08/2012   CHOL 174 03/18/2010   TRIG 48.0 03/18/2010   HDL 54.50 03/18/2010   LDLCALC 110* 03/18/2010   ALT 16 02/08/2012   AST 16 02/08/2012   NA 139 02/08/2012   K 3.6 02/08/2012   CL 105 02/08/2012   CREATININE 0.5 02/08/2012   BUN 11 02/08/2012   CO2 28 02/08/2012   TSH 0.67 02/08/2012        Assessment & Plan:

## 2012-12-30 NOTE — Assessment & Plan Note (Signed)
Start yoga Try Limbrel

## 2013-01-10 ENCOUNTER — Ambulatory Visit (INDEPENDENT_AMBULATORY_CARE_PROVIDER_SITE_OTHER): Payer: BC Managed Care – PPO | Admitting: Internal Medicine

## 2013-01-10 ENCOUNTER — Encounter: Payer: Self-pay | Admitting: Internal Medicine

## 2013-01-10 VITALS — BP 120/82 | HR 80 | Temp 98.7°F | Resp 16 | Wt 163.0 lb

## 2013-01-10 DIAGNOSIS — F3289 Other specified depressive episodes: Secondary | ICD-10-CM

## 2013-01-10 DIAGNOSIS — IMO0001 Reserved for inherently not codable concepts without codable children: Secondary | ICD-10-CM

## 2013-01-10 DIAGNOSIS — L500 Allergic urticaria: Secondary | ICD-10-CM

## 2013-01-10 DIAGNOSIS — L299 Pruritus, unspecified: Secondary | ICD-10-CM

## 2013-01-10 DIAGNOSIS — G4700 Insomnia, unspecified: Secondary | ICD-10-CM | POA: Insufficient documentation

## 2013-01-10 DIAGNOSIS — F329 Major depressive disorder, single episode, unspecified: Secondary | ICD-10-CM

## 2013-01-10 DIAGNOSIS — F32A Depression, unspecified: Secondary | ICD-10-CM | POA: Insufficient documentation

## 2013-01-10 MED ORDER — HYDROXYZINE HCL 25 MG PO TABS: 25.0000 mg | ORAL_TABLET | Freq: Three times a day (TID) | ORAL | Status: DC | PRN

## 2013-01-10 MED ORDER — METHYLPREDNISOLONE ACETATE 80 MG/ML IJ SUSP
80.0000 mg | Freq: Once | INTRAMUSCULAR | Status: AC
  Administered 2013-01-10: 80 mg via INTRAMUSCULAR

## 2013-01-10 MED ORDER — BUPROPION HCL ER (SR) 100 MG PO TB12: 100.0000 mg | ORAL_TABLET | ORAL | Status: DC

## 2013-01-10 NOTE — Assessment & Plan Note (Signed)
Will try Wellbutrin low dose

## 2013-01-10 NOTE — Assessment & Plan Note (Signed)
?  hives Depomedrol 80 mg im Diet

## 2013-01-10 NOTE — Assessment & Plan Note (Signed)
Depomedrol im Gluten free trial (no wheat products) for 4-6 weeks. OK to use gluten-free bread and gluten-free pasta. Milk free trial (no milk, ice cream, cheese and yogurt) for 4-6 weeks. OK to use almond, coconut, rice or soy milk. "Almond breeze" brand tastes good.

## 2013-01-10 NOTE — Progress Notes (Signed)
   Subjective:   HPI  F/u FMS, IBS, fatigue x long time all the time C/o insomnia - sleeping 2 hrs/night; depressed C/o hives F/u wt gain F/u Vit B12 F/u abn chest findings on abd CT F/o ovar cyst - resolved  BP Readings from Last 3 Encounters:  01/10/13 120/82  12/30/12 118/70  09/07/12 100/58   Wt Readings from Last 3 Encounters:  01/10/13 163 lb (73.936 kg)  12/30/12 164 lb (74.39 kg)  09/07/12 163 lb 8 oz (74.163 kg)      Review of Systems  Constitutional: Positive for fatigue and unexpected weight change. Negative for chills, activity change and appetite change.  HENT: Negative for congestion, mouth sores and sinus pressure.   Eyes: Negative for visual disturbance.  Respiratory: Positive for wheezing. Negative for cough and chest tightness.   Gastrointestinal: Positive for constipation and abdominal distention. Negative for nausea, abdominal pain and diarrhea.  Genitourinary: Negative for dysuria, frequency, difficulty urinating, genital sores and vaginal pain.  Musculoskeletal: Negative for back pain and gait problem.  Skin: Negative for pallor and rash.  Neurological: Negative for dizziness, tremors, weakness, light-headedness, numbness and headaches.  Psychiatric/Behavioral: Positive for dysphoric mood and decreased concentration. Negative for suicidal ideas, confusion and sleep disturbance. The patient is nervous/anxious.   itchy     Objective:   Physical Exam  Constitutional: She appears well-developed. No distress.  HENT:  Head: Normocephalic.  Right Ear: External ear normal.  Left Ear: External ear normal.  Nose: Nose normal.  Mouth/Throat: Oropharynx is clear and moist.  Eyes: Conjunctivae are normal. Pupils are equal, round, and reactive to light. Right eye exhibits no discharge. Left eye exhibits no discharge.  Neck: Normal range of motion. Neck supple. No JVD present. No tracheal deviation present. No thyromegaly present.  Cardiovascular: Normal  rate, regular rhythm and normal heart sounds.   Pulmonary/Chest: No stridor. No respiratory distress. She has no wheezes.  Abdominal: Soft. Bowel sounds are normal. She exhibits no distension and no mass. There is no tenderness. There is no rebound and no guarding.  Musculoskeletal: She exhibits no edema and no tenderness.  Lymphadenopathy:    She has no cervical adenopathy.  Neurological: She displays normal reflexes. No cranial nerve deficit. She exhibits normal muscle tone. Coordination normal.  Skin: No rash noted. No erythema.  Psychiatric: She has a normal mood and affect. Her behavior is normal. Judgment and thought content normal.  skin erythema in patches   Lab Results  Component Value Date   WBC 9.0 02/08/2012   HGB 12.7 02/08/2012   HCT 38.0 02/08/2012   PLT 309.0 02/08/2012   GLUCOSE 89 02/08/2012   CHOL 174 03/18/2010   TRIG 48.0 03/18/2010   HDL 54.50 03/18/2010   LDLCALC 110* 03/18/2010   ALT 16 02/08/2012   AST 16 02/08/2012   NA 139 02/08/2012   K 3.6 02/08/2012   CL 105 02/08/2012   CREATININE 0.5 02/08/2012   BUN 11 02/08/2012   CO2 28 02/08/2012   TSH 0.67 02/08/2012        Assessment & Plan:

## 2013-01-10 NOTE — Assessment & Plan Note (Signed)
D/c Flexeril - ?side effects Trial of Wellbutrin, rest, diet

## 2013-01-17 ENCOUNTER — Telehealth: Payer: Self-pay | Admitting: Internal Medicine

## 2013-01-17 ENCOUNTER — Ambulatory Visit (INDEPENDENT_AMBULATORY_CARE_PROVIDER_SITE_OTHER): Payer: BC Managed Care – PPO | Admitting: Internal Medicine

## 2013-01-17 ENCOUNTER — Ambulatory Visit (INDEPENDENT_AMBULATORY_CARE_PROVIDER_SITE_OTHER)
Admission: RE | Admit: 2013-01-17 | Discharge: 2013-01-17 | Disposition: A | Discharge status: Home / Self Care | Payer: BC Managed Care – PPO | Source: Ambulatory Visit | Attending: Internal Medicine | Admitting: Internal Medicine

## 2013-01-17 ENCOUNTER — Encounter: Payer: Self-pay | Admitting: Internal Medicine

## 2013-01-17 VITALS — BP 112/70 | HR 99 | Temp 98.5°F | Wt 161.8 lb

## 2013-01-17 DIAGNOSIS — IMO0001 Reserved for inherently not codable concepts without codable children: Secondary | ICD-10-CM

## 2013-01-17 DIAGNOSIS — R141 Gas pain: Secondary | ICD-10-CM

## 2013-01-17 DIAGNOSIS — M549 Dorsalgia, unspecified: Secondary | ICD-10-CM

## 2013-01-17 DIAGNOSIS — R079 Chest pain, unspecified: Secondary | ICD-10-CM

## 2013-01-17 NOTE — Telephone Encounter (Signed)
Patient Information:  Caller Name: Laquonda  Phone: [phone]  Patient: Regina [name], Regina [name]  Gender: Female  DOB: [birth year]  Age: 47 Years  PCP: Plotnikov, Alex (Adults only)  Pregnant: No  Office Follow Up:  Does the office need to follow up with this patient?: No  Instructions For The Office: N/A  RN Note:  Patient has history of Hysterectomy. Patient states she developed left chest discomfort radiating into back, under left shoulder blade, onset 0730 01/17/13. States pain has been constant. States pain increases at intervals. Patient describes discomfort as a dull ache.  Patient denies dyspnea, dizziness, diaphoresis or nausea. Patient states she feels "tightness under her shoulder blade. Patient advised to call 911 for transport to the ED. Patient declines to call 911. Patient states she is currently in route to the office for an appt. at 1600 01/17/13. RN strongly advised patient to call 911 for transport to ED. Patient advised not to drive self. Patient declines to call 911 or be seen in the ED. Possible consequences, including sudden cardiac death, explained to patient. Patient verbalizes understanding. RN notified Harriett Sine, in office, of above.  Symptoms  Reason For Call & Symptoms: Chest pain  Reviewed Health History In EMR: Yes  Reviewed Medications In EMR: Yes  Reviewed Allergies In EMR: Yes  Reviewed Surgeries / Procedures: Yes  Date of Onset of Symptoms: 01/17/2013 OB / GYN:  LMP: Unknown  Guideline(s) Used:  Chest Pain  Disposition Per Guideline:   Call EMS 911 Now  Reason For Disposition Reached:   Chest pain lasting longer than 5 minutes and ANY of the following:  Over 70 years old Over 19 years old and at least one cardiac risk factor (i.e., high blood pressure, diabetes, high cholesterol, obesity, smoker or strong family history of heart disease) Pain is crushing, pressure-like, or heavy  Took nitroglycerin and chest pain was not relieved History of heart  disease (i.e., angina, heart attack, bypass surgery, angioplasty, CHF)  Advice Given:  N/A  Patient Refused Recommendation:  Patient Refused Care Advice  Patient advised to call 911 for transport to the ED. Patient declines to call 911. Patient states she is currently in route to the office for an appt. at 1600 01/17/13. RN strongly advised patient to call 911 for transport to ED. Patient advised not to drive self. Patient declines to call 911 or be seen in the ED. Possible consequences, including sudden cardiac death, explained to patient. Patient verbalizes understanding.

## 2013-01-17 NOTE — Progress Notes (Signed)
Subjective:    Patient ID: Regina [name], female    DOB: [birth year], 47 y.o.   MRN: [id number]  HPI  Pt presents to the clinic today with c/o a pain under her left shoulder blade. This started this am. She does have a history of fibromyalgia and is not sure if it is related to that or gas. She has taken Gas X which has not helped. It is sometimes painful if she takes a deep breath. She denies any injury. She denies fever, chills, cough or reflux.  Review of Systems      Past Medical History  Diagnosis Date  . B12 DEFICIENCY   . Rosacea   . Allergic urticaria   . ARTHRALGIA   . Osteoarthritis   . Gastritis   . GERD   . CFS (chronic fatigue syndrome)   . Fibromyalgia   . Internal hemorrhoids   . MVP (mitral valve prolapse)   . History of endometriosis     Current Outpatient Prescriptions  Medication Sig Dispense Refill  . buPROPion (WELLBUTRIN SR) 100 MG 12 hr tablet Take 1 tablet (100 mg total) by mouth every morning.  30 tablet  5  . Cholecalciferol (GNP VITAMIN D) 1000 UNITS tablet Take 1 tablet (1,000 Units total) by mouth daily.  100 tablet  3  . Cyanocobalamin (VITAMIN B-12) 1000 MCG SUBL Place 1 tablet (1,000 mcg total) under the tongue daily.  100 tablet  3  . Flavocoxid (LIMBREL) 500 MG CAPS Take 1 capsule (500 mg total) by mouth 2 (two) times daily.  60 capsule  11  . hydrOXYzine (ATARAX/VISTARIL) 25 MG tablet Take 1 tablet (25 mg total) by mouth every 8 (eight) hours as needed for itching or anxiety (insomnia).  60 tablet  1  . Probiotic Product (ALIGN) 4 MG CAPS Take 1 capsule by mouth daily.  14 capsule  0  . psyllium (REGULOID) 0.52 G capsule Take 0.52 g by mouth daily.       No current facility-administered medications for this visit.    Allergies  Allergen Reactions  . Erythromycin Stearate     REACTION: all mycins  . Indomethacin     REACTION: lupus like illness  . Morphine Sulfate   . Sulfacetamide Sodium     Family History  Problem Relation Age  of Onset  . Asthma Neg Hx   . Diabetes      uncles/aunts  . Lung cancer Mother   . Thyroid cancer Mother     History   Social History  . Marital Status: Married    Spouse Name: N/A    Number of Children: N/A  . Years of Education: N/A   Occupational History  . Not on file.   Social History Main Topics  . Smoking status: Former Games developer  . Smokeless tobacco: Never Used  . Alcohol Use: No  . Drug Use: No  . Sexual Activity: Not on file   Other Topics Concern  . Not on file   Social History Narrative   Working 2 jobs, 6d/wk      Regular exercise-no      Divorced     Constitutional: Denies fever, malaise, fatigue, headache or abrupt weight changes.  Respiratory: Denies difficulty breathing, shortness of breath, cough or sputum production.   Cardiovascular: Denies chest pain, chest tightness, palpitations or swelling in the hands or feet.  Gastrointestinal: Denies abdominal pain, bloating, constipation, diarrhea or blood in the stool.  Musculoskeletal: Denies decrease in range of motion, difficulty  with gait, muscle pain or joint pain and swelling.    No other specific complaints in a complete review of systems (except as listed in HPI above).  Objective:   Physical Exam   BP 112/70  Pulse 99  Temp(Src) 98.5 F (36.9 C) (Oral)  Wt 161 lb 12 oz (73.369 kg)  BMI 27.75 kg/m2  SpO2 95% Wt Readings from Last 3 Encounters:  01/17/13 161 lb 12 oz (73.369 kg)  01/10/13 163 lb (73.936 kg)  12/30/12 164 lb (74.39 kg)    General: Appears her stated age, well developed, well nourished in NAD.Marland Kitchen  Cardiovascular: Normal rate and rhythm. S1,S2 noted.  No murmur, rubs or gallops noted. No JVD or BLE edema. No carotid bruits noted. Pulmonary/Chest: Normal effort and positive vesicular breath sounds. No respiratory distress. No wheezes, rales or ronchi noted.  Abdomen: Soft and nontender. Normal bowel sounds, no bruits noted. No distention or masses noted. Liver, spleen and  kidneys non palpable. Musculoskeletal: Normal range of motion. No signs of joint swelling. No difficulty with gait.    BMET    Component Value Date/Time   NA 139 02/08/2012 1441   K 3.6 02/08/2012 1441   CL 105 02/08/2012 1441   CO2 28 02/08/2012 1441   GLUCOSE 89 02/08/2012 1441   BUN 11 02/08/2012 1441   CREATININE 0.5 02/08/2012 1441   CALCIUM 9.0 02/08/2012 1441   GFRNONAA 114.95 03/18/2010 0735   GFRAA 142 04/21/2006 1128    Lipid Panel     Component Value Date/Time   CHOL 174 03/18/2010 0735   TRIG 48.0 03/18/2010 0735   HDL 54.50 03/18/2010 0735   CHOLHDL 3 03/18/2010 0735   VLDL 9.6 03/18/2010 0735   LDLCALC 110* 03/18/2010 0735    CBC    Component Value Date/Time   WBC 9.0 02/08/2012 1441   RBC 4.29 02/08/2012 1441   HGB 12.7 02/08/2012 1441   HCT 38.0 02/08/2012 1441   PLT 309.0 02/08/2012 1441   MCV 88.7 02/08/2012 1441   MCHC 33.3 02/08/2012 1441   RDW 13.1 02/08/2012 1441   LYMPHSABS 2.5 02/08/2012 1441   MONOABS 0.5 02/08/2012 1441   EOSABS 0.0 02/08/2012 1441   BASOSABS 0.0 02/08/2012 1441    Hgb A1C No results found for this basename: HGBA1C        Assessment & Plan:   Bloating behind left shoulder blade ? Gas:  Will check chest xray to r/o infection versus atelectasis Could be gas bubble Avoid caffeine and take gas X OTC  Will followup after chest xray

## 2013-01-17 NOTE — Patient Instructions (Signed)
Bloating  Bloating is the feeling of fullness in your belly. You may feel as though your pants are too tight. Often the cause of bloating is overeating, retaining fluids, or having gas in your bowel. It is also caused by swallowing air and eating foods that cause gas. Irritable bowel syndrome is one of the most common causes of bloating. Constipation is also a common cause. Sometimes more serious problems can cause bloating.  SYMPTOMS   Usually there is a feeling of fullness, as though your abdomen is bulged out. There may be mild discomfort.   DIAGNOSIS   Usually no particular testing is necessary for most bloating. If the condition persists and seems to become worse, your caregiver may do additional testing.   TREATMENT   · There is no direct treatment for bloating.  · Do not put gas into the bowel. Avoid chewing gum and sucking on candy. These tend to make you swallow air. Swallowing air can also be a nervous habit. Try to avoid this.  · Avoiding high residue diets will help. Eat foods with soluble fibers (examples include root vegetables, apples, or barley) and substitute dairy products with soy and rice products. This helps irritable bowel syndrome.  · If constipation is the cause, then a high residue diet with more fiber will help.  · Avoid carbonated beverages.  · Over-the-counter preparations are available that help reduce gas. Your pharmacist can help you with this.  SEEK MEDICAL CARE IF:   · Bloating continues and seems to be getting worse.  · You notice a weight gain.  · You have a weight loss but the bloating is getting worse.  · You have changes in your bowel habits or develop nausea or vomiting.  SEEK IMMEDIATE MEDICAL CARE IF:   · You develop shortness of breath or swelling in your legs.  · You have an increase in abdominal pain or develop chest pain.  Document Released: 01/14/2006 Document Revised: 06/08/2011 Document Reviewed: 03/04/2007  Exit[employer]® Patient Information ©2014 [employer].

## 2013-01-19 ENCOUNTER — Telehealth: Payer: Self-pay | Admitting: Internal Medicine

## 2013-01-19 NOTE — Telephone Encounter (Signed)
Spoke with patient and she saw her PCP's NP on 01/17/13 for back pain and left shoulder pain. She was told to take Gas X for possible gas. She states the shoulder pain continues and she now is having LUQ discomfort that is "gripping." States she has had this all night.  Offered OV with extender today or tomorrow. Patient scheduled on 01/20/13 at 8:30 AM.

## 2013-01-20 ENCOUNTER — Encounter: Payer: Self-pay | Admitting: Gastroenterology

## 2013-01-20 ENCOUNTER — Ambulatory Visit (INDEPENDENT_AMBULATORY_CARE_PROVIDER_SITE_OTHER): Payer: BC Managed Care – PPO | Admitting: Gastroenterology

## 2013-01-20 VITALS — BP 112/72 | HR 68 | Ht 64.0 in | Wt 161.6 lb

## 2013-01-20 DIAGNOSIS — R1012 Left upper quadrant pain: Secondary | ICD-10-CM | POA: Insufficient documentation

## 2013-01-20 NOTE — Patient Instructions (Signed)
You can take Zantac or drink baking soda water.  Follow up as needed                                               We are excited to introduce MyChart, a new best-in-class service that provides you online access to important information in your electronic medical record. We want to make it easier for you to view your health information - all in one secure location - when and where you need it. We expect MyChart will enhance the quality of care and service we provide.  When you register for MyChart, you can:    View your test results.    Request appointments and receive appointment reminders via email.    Request medication renewals.    View your medical history, allergies, medications and immunizations.    Communicate with your physician's office through a password-protected site.    Conveniently print information such as your medication lists.  To find out if MyChart is right for you, please talk to a member of our clinical staff today. We will gladly answer your questions about this free health and wellness tool.  If you are age 67 or older and want a member of your family to have access to your record, you must provide written consent by completing a proxy form available at our office. Please speak to our clinical staff about guidelines regarding accounts for patients younger than age 12.  As you activate your MyChart account and need any technical assistance, please call the MyChart technical support line at [phone]) or email your question to [email or web address]. If you email your question(s), please include your name, a return phone number and the best time to reach you.  If you have non-urgent health-related questions, you can send a message to our office through MyChart at [email or web address]. If you have a medical emergency, call 911.  Thank you for using MyChart as your new health and wellness resource!   MyChart licensed from [employer],  4540-9811. Patents Pending.

## 2013-01-20 NOTE — Progress Notes (Signed)
01/20/2013 Regina [name] [id number] [birth year]   History of Present Illness:  This is a 47 year old female who is known to Dr. Juanda Chance.  She has past medical history of osteoarthritis, chronic fatigue syndrome, and fibromyalgia. She tends to been noncompliant regarding taking medications because she does not like to take several things and says that she has reactions to a lot of medications.  Anyway, she comes to our office today with complaints of left-sided back pain since Tuesday. Then yesterday morning she started with pain in her left upper quadrant just under her rib cage. She thought it was all secondary to gas and took a lot of Gas-X, however, that did not help. She has no other associated symptoms. She was questioning if this could be secondary to her gallbladder. She had an ultrasound in August of last year, which was unremarkable. She had an EGD in September of 2013 at which time she was found to have reflux esophagitis and gastritis.  Gastric biopsies showed chronic gastritis, negative for H. pylori. Duodenal biopsies were normal. GE junction biopsy showed changes consistent with reflux, but no Barrett's. She refuses to take regular acid reduction therapy.  She does admit that due to her chronic pain conditions she does have diffuse pain and it is hard for her to differentiate at times.   Current Medications, Allergies, Past Medical History, Past Surgical History, Family History and Social History were reviewed in [employer] record.   Physical Exam: BP 112/72  Pulse 68  Ht 5\' 4"  (1.626 m)  Wt 161 lb 9.6 oz (73.301 kg)  BMI 27.72 kg/m2  SpO2 98% General: Well developed, white female in no acute distress Head: Normocephalic and atraumatic Eyes:  Sclerae anicteric, conjunctiva pink  Ears: Normal auditory acuity Lungs: Clear throughout to auscultation Heart: Regular rate and rhythm Abdomen: Soft, non-distended. No masses, no hepatomegaly. Normal bowel  sounds.  Mild LUQ abdominal pain without R/R/G. Musculoskeletal: Symmetrical with no gross deformities  Extremities: No edema  Neurological: Alert oriented x 4, grossly nonfocal Psychological:  Alert and cooperative. Normal mood and affect  Assessment and Recommendations: -Left upper quadrant abdominal pain and left-sided back pain:  ? if this is just gastritis. She's not very compliant when it comes taking medications. I have recommended that she begin taking daily Zantac, however, she says that she may just drink some baking soda water and give it some time.  She is asking if it could be her gallbladder. If this continues after several more days then could consider performing an ultrasound, however, she did have one that was normal last year and she is not having any right sided symptoms.

## 2013-01-20 NOTE — Progress Notes (Signed)
Reviewed, please check amy, lipase, Hepatic function.

## 2013-01-26 ENCOUNTER — Encounter: Payer: Self-pay | Admitting: Internal Medicine

## 2013-01-26 ENCOUNTER — Other Ambulatory Visit (INDEPENDENT_AMBULATORY_CARE_PROVIDER_SITE_OTHER): Payer: BC Managed Care – PPO

## 2013-01-26 ENCOUNTER — Ambulatory Visit (INDEPENDENT_AMBULATORY_CARE_PROVIDER_SITE_OTHER): Payer: BC Managed Care – PPO | Admitting: Internal Medicine

## 2013-01-26 VITALS — BP 102/68 | HR 80 | Temp 98.5°F | Resp 16

## 2013-01-26 DIAGNOSIS — R0789 Other chest pain: Secondary | ICD-10-CM

## 2013-01-26 DIAGNOSIS — R079 Chest pain, unspecified: Secondary | ICD-10-CM

## 2013-01-26 DIAGNOSIS — IMO0001 Reserved for inherently not codable concepts without codable children: Secondary | ICD-10-CM

## 2013-01-26 DIAGNOSIS — R071 Chest pain on breathing: Secondary | ICD-10-CM

## 2013-01-26 DIAGNOSIS — R109 Unspecified abdominal pain: Secondary | ICD-10-CM

## 2013-01-26 DIAGNOSIS — R1012 Left upper quadrant pain: Secondary | ICD-10-CM

## 2013-01-26 LAB — BASIC METABOLIC PANEL
CO2: 28 mEq/L (ref 19–32)
Calcium: 9.3 mg/dL (ref 8.4–10.5)
Chloride: 103 mEq/L (ref 96–112)
Glucose, Bld: 84 mg/dL (ref 70–99)
Potassium: 4.1 mEq/L (ref 3.5–5.1)
Sodium: 139 mEq/L (ref 135–145)

## 2013-01-26 LAB — CBC WITH DIFFERENTIAL/PLATELET
Basophils Absolute: 0 10*3/uL (ref 0.0–0.1)
Eosinophils Relative: 0.1 % (ref 0.0–5.0)
HCT: 39.5 % (ref 36.0–46.0)
Hemoglobin: 13.5 g/dL (ref 12.0–15.0)
Lymphocytes Relative: 29.3 % (ref 12.0–46.0)
Lymphs Abs: 2 10*3/uL (ref 0.7–4.0)
MCV: 87.2 fl (ref 78.0–100.0)
Monocytes Absolute: 0.5 10*3/uL (ref 0.1–1.0)
Monocytes Relative: 6.8 % (ref 3.0–12.0)
Neutrophils Relative %: 63.6 % (ref 43.0–77.0)
Platelets: 333 10*3/uL (ref 150.0–400.0)
RDW: 13.2 % (ref 11.5–14.6)
WBC: 7 10*3/uL (ref 4.5–10.5)

## 2013-01-26 LAB — URINALYSIS
Bilirubin Urine: NEGATIVE
Hgb urine dipstick: NEGATIVE
Ketones, ur: NEGATIVE
Leukocytes, UA: NEGATIVE
Specific Gravity, Urine: 1.015 (ref 1.000–1.030)
Urine Glucose: NEGATIVE
Urobilinogen, UA: 0.2 (ref 0.0–1.0)

## 2013-01-26 LAB — HEPATIC FUNCTION PANEL
ALT: 12 U/L (ref 0–35)
AST: 13 U/L (ref 0–37)
Albumin: 3.9 g/dL (ref 3.5–5.2)
Alkaline Phosphatase: 49 U/L (ref 39–117)
Bilirubin, Direct: 0.1 mg/dL (ref 0.0–0.3)
Total Protein: 7.1 g/dL (ref 6.0–8.3)

## 2013-01-26 LAB — H. PYLORI ANTIBODY, IGG: H Pylori IgG: NEGATIVE

## 2013-01-26 LAB — SEDIMENTATION RATE: Sed Rate: 11 mm/hr (ref 0–22)

## 2013-01-26 LAB — LIPASE: Lipase: 21 U/L (ref 11.0–59.0)

## 2013-01-26 MED ORDER — ESOMEPRAZOLE MAGNESIUM 20 MG PO CPDR: 20.0000 mg | DELAYED_RELEASE_CAPSULE | Freq: Every day | ORAL | Status: DC

## 2013-01-26 MED ORDER — CILIDINIUM-CHLORDIAZEPOXIDE 2.5-5 MG PO CAPS: 1.0000 | ORAL_CAPSULE | Freq: Three times a day (TID) | ORAL | Status: DC | PRN

## 2013-01-26 NOTE — Assessment & Plan Note (Addendum)
10/14 Stress related, food intolerances related   Potential benefits of a long term benzodiazepines  use as well as potential risks  and complications were explained to the patient and were aknowledged. Labs Tests  LIBRAX PRN

## 2013-01-26 NOTE — Assessment & Plan Note (Signed)
Labs Librax

## 2013-01-26 NOTE — Progress Notes (Signed)
  Subjective:    Patient ID: Regina [name], female    DOB: Sep 14, 1965, 47 y.o.   MRN: [id number]  Chest Pain  This is a new problem. The current episode started in the past 7 days. The onset quality is sudden. The problem occurs intermittently. The pain is present in the lateral region (L shoulder blade and around). The pain is at a severity of 9/10. The pain is severe. The quality of the pain is described as sharp and stabbing.  Pt was seen by GI - ?gastritis. Pain was severe last night - all night. Almost went to ER. Feeling bruised this am     Review of Systems  Cardiovascular: Positive for chest pain.       Objective:   Physical Exam        Assessment & Plan:

## 2013-01-27 NOTE — Assessment & Plan Note (Signed)
Gluten free and milk free diet Declined meds

## 2013-03-14 ENCOUNTER — Ambulatory Visit (INDEPENDENT_AMBULATORY_CARE_PROVIDER_SITE_OTHER): Payer: BC Managed Care – PPO | Admitting: Internal Medicine

## 2013-03-14 ENCOUNTER — Encounter: Payer: Self-pay | Admitting: Internal Medicine

## 2013-03-14 VITALS — BP 108/68 | HR 72 | Temp 98.1°F | Resp 16 | Wt 167.0 lb

## 2013-03-14 DIAGNOSIS — IMO0001 Reserved for inherently not codable concepts without codable children: Secondary | ICD-10-CM

## 2013-03-14 DIAGNOSIS — K589 Irritable bowel syndrome without diarrhea: Secondary | ICD-10-CM

## 2013-03-14 DIAGNOSIS — R51 Headache: Secondary | ICD-10-CM

## 2013-03-14 DIAGNOSIS — E538 Deficiency of other specified B group vitamins: Secondary | ICD-10-CM

## 2013-03-14 NOTE — Assessment & Plan Note (Signed)
Continue with current prescription therapy as reflected on the Med list. prn 

## 2013-03-14 NOTE — Assessment & Plan Note (Signed)
Continue with current prescription therapy as reflected on the Med list.  

## 2013-03-14 NOTE — Progress Notes (Signed)
  Subjective:   HPI  F/u FMS, IBS, fatigue x long time all the time. Stress is worse: sick parents, paraplegic brother... F/u wt gain F/u Vit B12 F/u abn chest findings on abd CT F/o ovar cyst - resolved  BP Readings from Last 3 Encounters:  03/14/13 108/68  01/26/13 102/68  01/20/13 112/72   Wt Readings from Last 3 Encounters:  03/14/13 167 lb (75.751 kg)  01/20/13 161 lb 9.6 oz (73.301 kg)  01/17/13 161 lb 12 oz (73.369 kg)      Review of Systems  Constitutional: Positive for fatigue and unexpected weight change. Negative for chills, activity change and appetite change.  HENT: Negative for congestion, mouth sores and sinus pressure.   Eyes: Negative for visual disturbance.  Respiratory: Positive for wheezing. Negative for cough and chest tightness.   Gastrointestinal: Positive for constipation and abdominal distention. Negative for nausea, abdominal pain and diarrhea.  Genitourinary: Negative for dysuria, frequency, difficulty urinating, genital sores and vaginal pain.  Musculoskeletal: Negative for back pain and gait problem.  Skin: Negative for pallor and rash.  Neurological: Negative for dizziness, tremors, weakness, light-headedness, numbness and headaches.  Psychiatric/Behavioral: Negative for suicidal ideas, confusion and sleep disturbance. The patient is nervous/anxious.        Objective:   Physical Exam  Constitutional: She appears well-developed. No distress.  HENT:  Head: Normocephalic.  Right Ear: External ear normal.  Left Ear: External ear normal.  Nose: Nose normal.  Mouth/Throat: Oropharynx is clear and moist.  Eyes: Conjunctivae are normal. Pupils are equal, round, and reactive to light. Right eye exhibits no discharge. Left eye exhibits no discharge.  Neck: Normal range of motion. Neck supple. No JVD present. No tracheal deviation present. No thyromegaly present.  Cardiovascular: Normal rate, regular rhythm and normal heart sounds.    Pulmonary/Chest: No stridor. No respiratory distress. She has no wheezes.  Abdominal: Soft. Bowel sounds are normal. She exhibits no distension and no mass. There is no tenderness. There is no rebound and no guarding.  Musculoskeletal: She exhibits no edema and no tenderness.  Lymphadenopathy:    She has no cervical adenopathy.  Neurological: She displays normal reflexes. No cranial nerve deficit. She exhibits normal muscle tone. Coordination normal.  Skin: No rash noted. No erythema.  Psychiatric: She has a normal mood and affect. Her behavior is normal. Judgment and thought content normal.     Lab Results  Component Value Date   WBC 7.0 01/26/2013   HGB 13.5 01/26/2013   HCT 39.5 01/26/2013   PLT 333.0 01/26/2013   GLUCOSE 84 01/26/2013   CHOL 174 03/18/2010   TRIG 48.0 03/18/2010   HDL 54.50 03/18/2010   LDLCALC 110* 03/18/2010   ALT 12 01/26/2013   AST 13 01/26/2013   NA 139 01/26/2013   K 4.1 01/26/2013   CL 103 01/26/2013   CREATININE 0.7 01/26/2013   BUN 11 01/26/2013   CO2 28 01/26/2013   TSH 0.67 02/08/2012        Assessment & Plan:

## 2013-03-14 NOTE — Progress Notes (Signed)
Pre visit review using our clinic review tool, if applicable. No additional management support is needed unless otherwise documented below in the visit note. 

## 2013-03-14 NOTE — Progress Notes (Deleted)
Patient ID: Regina [name], female   DOB: [birth year], 47 y.o.   MRN: [id number]  Subjective:    Patient ID: Regina [name], female    DOB: 10/24/1965, 47 y.o.   MRN: [id number]  Chest Pain  This is a new problem. The current episode started in the past 7 days. The onset quality is sudden. The problem occurs intermittently. The pain is present in the lateral region (L shoulder blade and around). The pain is at a severity of 9/10. The pain is severe. The quality of the pain is described as sharp and stabbing.   Pt was seen by GI - ?gastritis. Pain was severe last night - all night. Almost went to ER. Feeling bruised this am  Wt Readings from Last 3 Encounters:  03/14/13 167 lb (75.751 kg)  01/20/13 161 lb 9.6 oz (73.301 kg)  01/17/13 161 lb 12 oz (73.369 kg)   BP Readings from Last 3 Encounters:  03/14/13 108/68  01/26/13 102/68  01/20/13 112/72       Review of Systems  Cardiovascular: Positive for chest pain.       Objective:   Physical Exam        Assessment & Plan:

## 2013-03-14 NOTE — Assessment & Plan Note (Signed)
Gluten free and milk free diet helped Risks associated with diet noncompliance were discussed. Compliance was encouraged.

## 2013-03-31 ENCOUNTER — Ambulatory Visit: Payer: BC Managed Care – PPO | Admitting: Internal Medicine

## 2013-04-24 ENCOUNTER — Ambulatory Visit: Payer: BC Managed Care – PPO | Admitting: Internal Medicine

## 2013-04-25 ENCOUNTER — Ambulatory Visit (INDEPENDENT_AMBULATORY_CARE_PROVIDER_SITE_OTHER): Payer: BC Managed Care – PPO | Admitting: Family Medicine

## 2013-04-25 ENCOUNTER — Encounter: Payer: Self-pay | Admitting: Family Medicine

## 2013-04-25 VITALS — BP 130/68 | HR 76 | Temp 98.8°F | Resp 16 | Wt 165.0 lb

## 2013-04-25 DIAGNOSIS — J069 Acute upper respiratory infection, unspecified: Secondary | ICD-10-CM

## 2013-04-25 MED ORDER — AMOXICILLIN-POT CLAVULANATE 875-125 MG PO TABS: 1.0000 | ORAL_TABLET | Freq: Two times a day (BID) | ORAL | Status: DC

## 2013-04-25 MED ORDER — BENZONATATE 200 MG PO CAPS: 200.0000 mg | ORAL_CAPSULE | Freq: Three times a day (TID) | ORAL | Status: DC | PRN

## 2013-04-25 NOTE — Progress Notes (Signed)
Pre-visit discussion using our clinic review tool. No additional management support is needed unless otherwise documented below in the visit note.  

## 2013-04-25 NOTE — Patient Instructions (Signed)
Very nice to meet you Teaspoon honey at room temperature can help with the cough Tessalon perles for cough twice a day.  Augmetin twice daily but dont fill until Thursday if not better  Upper Respiratory Infection, Adult An upper respiratory infection (URI) is also sometimes known as the common cold. The upper respiratory tract includes the nose, sinuses, throat, trachea, and bronchi. Bronchi are the airways leading to the lungs. Most people improve within 1 week, but symptoms can last up to 2 weeks. A residual cough may last even longer.  CAUSES Many different viruses can infect the tissues lining the upper respiratory tract. The tissues become irritated and inflamed and often become very moist. Mucus production is also common. A cold is contagious. You can easily spread the virus to others by oral contact. This includes kissing, sharing a glass, coughing, or sneezing. Touching your mouth or nose and then touching a surface, which is then touched by another person, can also spread the virus. SYMPTOMS  Symptoms typically develop 1 to 3 days after you come in contact with a cold virus. Symptoms vary from person to person. They may include:  Runny nose.  Sneezing.  Nasal congestion.  Sinus irritation.  Sore throat.  Loss of voice (laryngitis).  Cough.  Fatigue.  Muscle aches.  Loss of appetite.  Headache.  Low-grade fever. DIAGNOSIS  You might diagnose your own cold based on familiar symptoms, since most people get a cold 2 to 3 times a year. Your caregiver can confirm this based on your exam. Most importantly, your caregiver can check that your symptoms are not due to another disease such as strep throat, sinusitis, pneumonia, asthma, or epiglottitis. Blood tests, throat tests, and X-rays are not necessary to diagnose a common cold, but they may sometimes be helpful in excluding other more serious diseases. Your caregiver will decide if any further tests are required. RISKS AND  COMPLICATIONS  You may be at risk for a more severe case of the common cold if you smoke cigarettes, have chronic heart disease (such as heart failure) or lung disease (such as asthma), or if you have a weakened immune system. The very young and very old are also at risk for more serious infections. Bacterial sinusitis, middle ear infections, and bacterial pneumonia can complicate the common cold. The common cold can worsen asthma and chronic obstructive pulmonary disease (COPD). Sometimes, these complications can require emergency medical care and may be life-threatening. PREVENTION  The best way to protect against getting a cold is to practice good hygiene. Avoid oral or hand contact with people with cold symptoms. Wash your hands often if contact occurs. There is no clear evidence that vitamin C, vitamin E, echinacea, or exercise reduces the chance of developing a cold. However, it is always recommended to get plenty of rest and practice good nutrition. TREATMENT  Treatment is directed at relieving symptoms. There is no cure. Antibiotics are not effective, because the infection is caused by a virus, not by bacteria. Treatment may include:  Increased fluid intake. Sports drinks offer valuable electrolytes, sugars, and fluids.  Breathing heated mist or steam (vaporizer or shower).  Eating chicken soup or other clear broths, and maintaining good nutrition.  Getting plenty of rest.  Using gargles or lozenges for comfort.  Controlling fevers with ibuprofen or acetaminophen as directed by your caregiver.  Increasing usage of your inhaler if you have asthma. Zinc gel and zinc lozenges, taken in the first 24 hours of the common cold, can  shorten the duration and lessen the severity of symptoms. Pain medicines may help with fever, muscle aches, and throat pain. A variety of non-prescription medicines are available to treat congestion and runny nose. Your caregiver can make recommendations and may  suggest nasal or lung inhalers for other symptoms.  HOME CARE INSTRUCTIONS   Only take over-the-counter or prescription medicines for pain, discomfort, or fever as directed by your caregiver.  Use a warm mist humidifier or inhale steam from a shower to increase air moisture. This may keep secretions moist and make it easier to breathe.  Drink enough water and fluids to keep your urine clear or pale yellow.  Rest as needed.  Return to work when your temperature has returned to normal or as your caregiver advises. You may need to stay home longer to avoid infecting others. You can also use a face mask and careful hand washing to prevent spread of the virus. SEEK MEDICAL CARE IF:   After the first few days, you feel you are getting worse rather than better.  You need your caregiver's advice about medicines to control symptoms.  You develop chills, worsening shortness of breath, or brown or red sputum. These may be signs of pneumonia.  You develop yellow or brown nasal discharge or pain in the face, especially when you bend forward. These may be signs of sinusitis.  You develop a fever, swollen neck glands, pain with swallowing, or white areas in the back of your throat. These may be signs of strep throat. SEEK IMMEDIATE MEDICAL CARE IF:   You have a fever.  You develop severe or persistent headache, ear pain, sinus pain, or chest pain.  You develop wheezing, a prolonged cough, cough up blood, or have a change in your usual mucus (if you have chronic lung disease).  You develop sore muscles or a stiff neck. Document Released: 09/09/2000 Document Revised: 06/08/2011 Document Reviewed: 07/18/2010 [facility] Patient Information 2014 [place], [place].

## 2013-04-25 NOTE — Progress Notes (Signed)
SUBJECTIVE:  Regina [name] is a 48 y.o. female who complains of congestion, sneezing, productive cough, chills and hoarseness for 5 days. She denies a history of dizziness, fatigue, fevers, shortness of breath, weakness and weight loss and denies a history of asthma. Patient has smoke cigarettes.   OBJECTIVE: Blood pressure 130/68, pulse 76, temperature 98.8 F (37.1 C), temperature source Oral, resp. rate 16, weight 165 lb 0.6 oz (74.862 kg), SpO2 96.00%.  She appears well, vital signs are as noted. Ears normal.  Throat and pharynx mild erythema.  Neck supple. Mild adenopathy in the neck. Nose is congested. Sinuses non tender. The chest is clear, without wheezes or rales.  ASSESSMENT:  viral upper respiratory illness  PLAN: Symptomatic therapy suggested: push fluids, rest and return office visit prn if symptoms persist or worsen. Lack of antibiotic effectiveness discussed with her. Patient who is very concerned that she may get six-week did give her a week and see antibiotic of Augmentin. Call or return to clinic prn if these symptoms worsen or fail to improve as anticipated.

## 2013-04-27 ENCOUNTER — Ambulatory Visit (INDEPENDENT_AMBULATORY_CARE_PROVIDER_SITE_OTHER): Payer: BC Managed Care – PPO | Admitting: Internal Medicine

## 2013-04-27 ENCOUNTER — Encounter: Payer: Self-pay | Admitting: Internal Medicine

## 2013-04-27 VITALS — BP 100/64 | HR 80 | Temp 98.5°F | Resp 16

## 2013-04-27 DIAGNOSIS — M2550 Pain in unspecified joint: Secondary | ICD-10-CM

## 2013-04-27 DIAGNOSIS — J069 Acute upper respiratory infection, unspecified: Secondary | ICD-10-CM

## 2013-04-27 DIAGNOSIS — J320 Chronic maxillary sinusitis: Secondary | ICD-10-CM

## 2013-04-27 MED ORDER — PROMETHAZINE-CODEINE 6.25-10 MG/5ML PO SYRP: 5.0000 mL | ORAL_SOLUTION | ORAL | Status: DC | PRN

## 2013-04-27 NOTE — Assessment & Plan Note (Signed)
Discussed.

## 2013-04-27 NOTE — Assessment & Plan Note (Signed)
Use Prom-cod syr prn Tessalon prn To work 05/01/13 if ok

## 2013-04-27 NOTE — Progress Notes (Signed)
  Subjective:   URI  This is a new problem. The current episode started in the past 7 days. The problem has been gradually worsening. There has been no fever. Associated symptoms include congestion, coughing, rhinorrhea, sinus pain, a sore throat, swollen glands and wheezing. Pertinent negatives include no abdominal pain, diarrhea, dysuria, headaches, nausea or rash. The treatment provided no relief.    F/u FMS, IBS, fatigue x long time all the time. Stress is worse: sick parents, paraplegic brother... F/u wt gain F/u Vit B12 F/u abn chest findings on abd CT F/o ovar cyst - resolved  BP Readings from Last 3 Encounters:  04/27/13 100/64  04/25/13 130/68  03/14/13 108/68   Wt Readings from Last 3 Encounters:  04/25/13 165 lb 0.6 oz (74.862 kg)  03/14/13 167 lb (75.751 kg)  01/20/13 161 lb 9.6 oz (73.301 kg)      Review of Systems  Constitutional: Positive for fatigue and unexpected weight change. Negative for chills, activity change and appetite change.  HENT: Positive for congestion, rhinorrhea and sore throat. Negative for mouth sores and sinus pressure.   Eyes: Negative for visual disturbance.  Respiratory: Positive for cough and wheezing. Negative for chest tightness.   Gastrointestinal: Positive for constipation and abdominal distention. Negative for nausea, abdominal pain and diarrhea.  Genitourinary: Negative for dysuria, frequency, difficulty urinating, genital sores and vaginal pain.  Musculoskeletal: Negative for back pain and gait problem.  Skin: Negative for pallor and rash.  Neurological: Negative for dizziness, tremors, weakness, light-headedness, numbness and headaches.  Psychiatric/Behavioral: Negative for suicidal ideas, confusion and sleep disturbance. The patient is nervous/anxious.        Objective:   Physical Exam  Constitutional: She appears well-developed. No distress.  HENT:  Head: Normocephalic.  Right Ear: External ear normal.  Left Ear: External  ear normal.  Nose: Nose normal.  Mouth/Throat: No oropharyngeal exudate.  eryth throat Nasal d/c  Eyes: Conjunctivae are normal. Pupils are equal, round, and reactive to light. Right eye exhibits no discharge. Left eye exhibits no discharge.  Neck: Normal range of motion. Neck supple. No JVD present. No tracheal deviation present. No thyromegaly present.  Cardiovascular: Normal rate, regular rhythm and normal heart sounds.   Pulmonary/Chest: No stridor. No respiratory distress. She has no wheezes.  Abdominal: Soft. Bowel sounds are normal. She exhibits no distension and no mass. There is no tenderness. There is no rebound and no guarding.  Musculoskeletal: She exhibits no edema and no tenderness.  Lymphadenopathy:    She has no cervical adenopathy.  Neurological: She displays normal reflexes. No cranial nerve deficit. She exhibits normal muscle tone. Coordination normal.  Skin: No rash noted. No erythema.  Psychiatric: She has a normal mood and affect. Her behavior is normal. Judgment and thought content normal.     Lab Results  Component Value Date   WBC 7.0 01/26/2013   HGB 13.5 01/26/2013   HCT 39.5 01/26/2013   PLT 333.0 01/26/2013   GLUCOSE 84 01/26/2013   CHOL 174 03/18/2010   TRIG 48.0 03/18/2010   HDL 54.50 03/18/2010   LDLCALC 110* 03/18/2010   ALT 12 01/26/2013   AST 13 01/26/2013   NA 139 01/26/2013   K 4.1 01/26/2013   CL 103 01/26/2013   CREATININE 0.7 01/26/2013   BUN 11 01/26/2013   CO2 28 01/26/2013   TSH 0.67 02/08/2012        Assessment & Plan:

## 2013-04-27 NOTE — Progress Notes (Signed)
Pre visit review using our clinic review tool, if applicable. No additional management support is needed unless otherwise documented below in the visit note. 

## 2013-04-27 NOTE — Assessment & Plan Note (Signed)
Start Augmentin 

## 2013-04-27 NOTE — Patient Instructions (Signed)
Use over-the-counter  "cold" medicines  such as "Tylenol cold" , "Advil cold",  "Mucinex" or" Mucinex D"  for cough and congestion.   Avoid decongestants if you have high blood pressure and use "Afrin" nasal spray for nasal congestion as directed instead. Use" Delsym" or" Robitussin" cough syrup varietis for cough.  You can use plain "Tylenol" or "Advil" for fever, chills and achyness.  Please, make an appointment if you are not better or if you're worse.  

## 2013-05-08 ENCOUNTER — Encounter: Payer: Self-pay | Admitting: Internal Medicine

## 2013-05-08 ENCOUNTER — Ambulatory Visit (INDEPENDENT_AMBULATORY_CARE_PROVIDER_SITE_OTHER): Payer: BC Managed Care – PPO | Admitting: Internal Medicine

## 2013-05-08 VITALS — BP 112/72 | HR 76 | Temp 99.7°F | Resp 16

## 2013-05-08 DIAGNOSIS — J069 Acute upper respiratory infection, unspecified: Secondary | ICD-10-CM

## 2013-05-08 DIAGNOSIS — J45909 Unspecified asthma, uncomplicated: Secondary | ICD-10-CM

## 2013-05-08 DIAGNOSIS — H8110 Benign paroxysmal vertigo, unspecified ear: Secondary | ICD-10-CM

## 2013-05-08 MED ORDER — FLUTICASONE FUROATE-VILANTEROL 100-25 MCG/INH IN AEPB: 1.0000 | INHALATION_SPRAY | Freq: Every day | RESPIRATORY_TRACT | Status: DC

## 2013-05-08 MED ORDER — METHYLPREDNISOLONE ACETATE 80 MG/ML IJ SUSP
80.0000 mg | Freq: Once | INTRAMUSCULAR | Status: AC
  Administered 2013-05-08: 80 mg via INTRAMUSCULAR

## 2013-05-08 MED ORDER — MECLIZINE HCL 12.5 MG PO TABS: 12.5000 mg | ORAL_TABLET | Freq: Three times a day (TID) | ORAL | Status: DC | PRN

## 2013-05-08 NOTE — Assessment & Plan Note (Signed)
Finished Augmentin 

## 2013-05-08 NOTE — Assessment & Plan Note (Addendum)
Benign Positional Vertigo symptoms on the left. Start Meclizine. Start Laruth Bouchard - Daroff exercise several times a day as dirrected. No driving if dizzy To work on 2/16 if ok

## 2013-05-08 NOTE — Progress Notes (Signed)
Subjective:   URI  This is a new problem. The current episode started 1 to 4 weeks ago. The problem has been waxing and waning. There has been no fever. Associated symptoms include congestion, coughing, rhinorrhea, sinus pain, a sore throat, swollen glands and wheezing. Pertinent negatives include no abdominal pain, diarrhea, dysuria, headaches, nausea or rash. The treatment provided no relief.    C/o nausea and dizziness x 5-7 days - like motion sickness  F/u FMS, IBS, fatigue x long time all the time. Stress is worse: sick parents, paraplegic brother... F/u wt gain F/u Vit B12 F/u abn chest findings on abd CT F/o ovar cyst - resolved  BP Readings from Last 3 Encounters:  05/08/13 112/72  04/27/13 100/64  04/25/13 130/68   Wt Readings from Last 3 Encounters:  04/25/13 165 lb 0.6 oz (74.862 kg)  03/14/13 167 lb (75.751 kg)  01/20/13 161 lb 9.6 oz (73.301 kg)      Review of Systems  Constitutional: Positive for fatigue and unexpected weight change. Negative for chills, activity change and appetite change.  HENT: Positive for congestion, rhinorrhea and sore throat. Negative for mouth sores and sinus pressure.   Eyes: Negative for visual disturbance.  Respiratory: Positive for cough and wheezing. Negative for chest tightness.   Gastrointestinal: Positive for constipation and abdominal distention. Negative for nausea, abdominal pain and diarrhea.  Genitourinary: Negative for dysuria, frequency, difficulty urinating, genital sores and vaginal pain.  Musculoskeletal: Negative for back pain and gait problem.  Skin: Negative for pallor and rash.  Neurological: Negative for dizziness, tremors, weakness, light-headedness, numbness and headaches.  Psychiatric/Behavioral: Negative for suicidal ideas, confusion and sleep disturbance. The patient is nervous/anxious.        Objective:   Physical Exam  Constitutional: She appears well-developed. No distress.  HENT:  Head:  Normocephalic.  Right Ear: External ear normal.  Left Ear: External ear normal.  Nose: Nose normal.  Mouth/Throat: No oropharyngeal exudate.  eryth throat Nasal d/c  Eyes: Conjunctivae are normal. Pupils are equal, round, and reactive to light. Right eye exhibits no discharge. Left eye exhibits no discharge.  Neck: Normal range of motion. Neck supple. No JVD present. No tracheal deviation present. No thyromegaly present.  Cardiovascular: Normal rate, regular rhythm and normal heart sounds.   Pulmonary/Chest: No stridor. No respiratory distress. She has no wheezes.  Abdominal: Soft. Bowel sounds are normal. She exhibits no distension and no mass. There is no tenderness. There is no rebound and no guarding.  Musculoskeletal: She exhibits no edema and no tenderness.  Lymphadenopathy:    She has no cervical adenopathy.  Neurological: She displays normal reflexes. No cranial nerve deficit. She exhibits normal muscle tone. Coordination normal.  Skin: No rash noted. No erythema.  Psychiatric: She has a normal mood and affect. Her behavior is normal. Judgment and thought content normal.  H-P (+) on L   Lab Results  Component Value Date   WBC 7.0 01/26/2013   HGB 13.5 01/26/2013   HCT 39.5 01/26/2013   PLT 333.0 01/26/2013   GLUCOSE 84 01/26/2013   CHOL 174 03/18/2010   TRIG 48.0 03/18/2010   HDL 54.50 03/18/2010   LDLCALC 110* 03/18/2010   ALT 12 01/26/2013   AST 13 01/26/2013   NA 139 01/26/2013   K 4.1 01/26/2013   CL 103 01/26/2013   CREATININE 0.7 01/26/2013   BUN 11 01/26/2013   CO2 28 01/26/2013   TSH 0.67 02/08/2012   I personally provided Breo inhaler use teaching.  After the teaching patient was able to demonstrate it's use effectively. All questions were answered      Assessment & Plan:

## 2013-05-08 NOTE — Patient Instructions (Signed)
Benign Positional Vertigo symptoms on the left. Start Meclizine. Start Brandt - Daroff exercise several times a day as dirrected. 

## 2013-05-08 NOTE — Progress Notes (Signed)
Pre visit review using our clinic review tool, if applicable. No additional management support is needed unless otherwise documented below in the visit note. 

## 2013-05-08 NOTE — Assessment & Plan Note (Signed)
Breo qd Depomedrol 80 mg im

## 2013-05-15 ENCOUNTER — Ambulatory Visit: Payer: BC Managed Care – PPO | Admitting: Internal Medicine

## 2013-05-15 ENCOUNTER — Encounter: Payer: Self-pay | Admitting: Internal Medicine

## 2013-05-15 ENCOUNTER — Ambulatory Visit (INDEPENDENT_AMBULATORY_CARE_PROVIDER_SITE_OTHER): Payer: BC Managed Care – PPO | Admitting: Internal Medicine

## 2013-05-15 VITALS — BP 112/72 | HR 80 | Temp 98.5°F | Resp 16 | Wt 166.0 lb

## 2013-05-15 DIAGNOSIS — H9202 Otalgia, left ear: Secondary | ICD-10-CM | POA: Insufficient documentation

## 2013-05-15 DIAGNOSIS — R42 Dizziness and giddiness: Secondary | ICD-10-CM

## 2013-05-15 DIAGNOSIS — J45909 Unspecified asthma, uncomplicated: Secondary | ICD-10-CM

## 2013-05-15 DIAGNOSIS — H9209 Otalgia, unspecified ear: Secondary | ICD-10-CM

## 2013-05-15 MED ORDER — FLUTICASONE PROPIONATE 50 MCG/ACT NA SUSP: 2.0000 | Freq: Every day | NASAL | Status: DC

## 2013-05-15 MED ORDER — AMOXICILLIN-POT CLAVULANATE 875-125 MG PO TABS: 1.0000 | ORAL_TABLET | Freq: Two times a day (BID) | ORAL | Status: DC

## 2013-05-15 NOTE — Assessment & Plan Note (Signed)
2/15  R/o sinusitis

## 2013-05-15 NOTE — Progress Notes (Signed)
Pre visit review using our clinic review tool, if applicable. No additional management support is needed unless otherwise documented below in the visit note. 

## 2013-05-15 NOTE — Assessment & Plan Note (Signed)
Cough resolved

## 2013-05-15 NOTE — Assessment & Plan Note (Signed)
Not better  CT sinuses

## 2013-05-16 ENCOUNTER — Encounter: Payer: Self-pay | Admitting: Internal Medicine

## 2013-05-16 NOTE — Progress Notes (Signed)
   Subjective:   HPI  C/o L earache x several days F/u nausea and dizziness x 14 days - like motion sickness - better  F/u FMS, IBS, fatigue x long time all the time. Stress is worse: sick parents, paraplegic brother... F/u wt gain F/u Vit B12 F/u abn chest findings on abd CT F/o ovar cyst - resolved  BP Readings from Last 3 Encounters:  05/15/13 112/72  05/08/13 112/72  04/27/13 100/64   Wt Readings from Last 3 Encounters:  05/15/13 166 lb (75.297 kg)  04/25/13 165 lb 0.6 oz (74.862 kg)  03/14/13 167 lb (75.751 kg)      Review of Systems  Constitutional: Positive for fatigue and unexpected weight change. Negative for chills, activity change and appetite change.  HENT: Negative for mouth sores and sinus pressure.   Eyes: Negative for visual disturbance.  Respiratory: Negative for chest tightness.   Gastrointestinal: Positive for constipation and abdominal distention.  Genitourinary: Negative for frequency, difficulty urinating, genital sores and vaginal pain.  Musculoskeletal: Negative for back pain and gait problem.  Skin: Negative for pallor.  Neurological: Negative for dizziness, tremors, weakness, light-headedness and numbness.  Psychiatric/Behavioral: Negative for suicidal ideas, confusion and sleep disturbance. The patient is nervous/anxious.   B TMs WNL     Objective:   Physical Exam  Constitutional: She appears well-developed. No distress.  HENT:  Head: Normocephalic.  Right Ear: External ear normal.  Left Ear: External ear normal.  Nose: Nose normal.  Mouth/Throat: No oropharyngeal exudate.  eryth throat Nasal d/c  Eyes: Conjunctivae are normal. Pupils are equal, round, and reactive to light. Right eye exhibits no discharge. Left eye exhibits no discharge.  Neck: Normal range of motion. Neck supple. No JVD present. No tracheal deviation present. No thyromegaly present.  Cardiovascular: Normal rate, regular rhythm and normal heart sounds.    Pulmonary/Chest: No stridor. No respiratory distress. She has no wheezes.  Abdominal: Soft. Bowel sounds are normal. She exhibits no distension and no mass. There is no tenderness. There is no rebound and no guarding.  Musculoskeletal: She exhibits no edema and no tenderness.  Lymphadenopathy:    She has no cervical adenopathy.  Neurological: She displays normal reflexes. No cranial nerve deficit. She exhibits normal muscle tone. Coordination normal.  Skin: No rash noted. No erythema.  Psychiatric: She has a normal mood and affect. Her behavior is normal. Judgment and thought content normal.     Lab Results  Component Value Date   WBC 7.0 01/26/2013   HGB 13.5 01/26/2013   HCT 39.5 01/26/2013   PLT 333.0 01/26/2013   GLUCOSE 84 01/26/2013   CHOL 174 03/18/2010   TRIG 48.0 03/18/2010   HDL 54.50 03/18/2010   LDLCALC 110* 03/18/2010   ALT 12 01/26/2013   AST 13 01/26/2013   NA 139 01/26/2013   K 4.1 01/26/2013   CL 103 01/26/2013   CREATININE 0.7 01/26/2013   BUN 11 01/26/2013   CO2 28 01/26/2013   TSH 0.67 02/08/2012        Assessment & Plan:

## 2013-05-23 ENCOUNTER — Ambulatory Visit (INDEPENDENT_AMBULATORY_CARE_PROVIDER_SITE_OTHER)
Admission: RE | Admit: 2013-05-23 | Discharge: 2013-05-23 | Disposition: A | Discharge status: Home / Self Care | Payer: BC Managed Care – PPO | Source: Ambulatory Visit | Attending: Internal Medicine | Admitting: Internal Medicine

## 2013-05-23 DIAGNOSIS — H9202 Otalgia, left ear: Secondary | ICD-10-CM

## 2013-05-23 DIAGNOSIS — H9209 Otalgia, unspecified ear: Secondary | ICD-10-CM

## 2013-05-23 DIAGNOSIS — R42 Dizziness and giddiness: Secondary | ICD-10-CM

## 2013-06-09 DIAGNOSIS — Z0279 Encounter for issue of other medical certificate: Secondary | ICD-10-CM

## 2013-07-20 ENCOUNTER — Telehealth: Payer: Self-pay | Admitting: Internal Medicine

## 2013-07-20 NOTE — Telephone Encounter (Signed)
Patient states that her employer needs a letter stating that she was released to return to work on 05/24/13 with no restriction. Patient had FMLA and STD forms filled out for when she was sick around that time and they just need the letter in addition to her forms.  Letter needs to be sent to Fax#: [phone].     Please advise if okay to write letter.

## 2013-07-21 NOTE — Telephone Encounter (Signed)
Letter printed/signed/faxed to number below.  

## 2013-07-21 NOTE — Telephone Encounter (Signed)
Ok Pls write a letter Thx

## 2013-07-27 ENCOUNTER — Ambulatory Visit: Payer: BC Managed Care – PPO | Admitting: Internal Medicine

## 2013-07-27 DIAGNOSIS — Z0289 Encounter for other administrative examinations: Secondary | ICD-10-CM

## 2014-04-04 ENCOUNTER — Telehealth: Payer: Self-pay | Admitting: *Deleted

## 2014-04-04 NOTE — Telephone Encounter (Signed)
[facility] Patient Name: Regina [name] Gender: Female DOB: March 19, 1966 Age: 49 Y 11 M 13 D Return Phone Number: [phone] (Primary) Address: [address] [place] City/State/Zip: [place] [address] Client [facility] - Client Client Site [facility] - Day Physician Plotnikov, Alex Contact Type Call Call Type Triage / Clinical Relationship To Patient Self Appointment Disposition EMR Appointment Not Necessary Return Phone Number [phone] (Primary) Chief Complaint Appetite decreased Initial Comment caller states she does not want to eat PreDisposition Call Doctor Nurse Assessment Nurse: Thad Ranger, RN, Langley Gauss Date/Time (Eastern Time): 04/04/2014 2:54:12 PM Confirm and document reason for call. If symptomatic, describe symptoms. ---Caller states she does not want to eat. States she went out to eat last wk and the food was very salty. States she can force food down, but feels like her her throat is closing up. States she ate some Meditarean today and her stomach feels like it is hard. States her last BM was this am and she has no nausea/vomiting. No new meds and has not been sick recently. Has not taken a day to eat soft/bland foods and drink liquids. States she has diff swallowing d/t does not want to swallow d/t gag reflex and denies inability to swallow due to throat closing up. Has the patient traveled out of the country within the last 30 days? ---Not Applicable Does the patient require triage? ---Yes Related visit to physician within the last 2 weeks? ---No Does the PT have any chronic conditions? (i.e. diabetes, asthma, etc.) ---Yes List chronic conditions. ---B 12 deficient Did the patient indicate they were pregnant? ---No Guidelines Guideline Title Affirmed Question Affirmed Notes Nurse Date/Time (Eastern Time) Swallowing Difficulty Pill stuck in  throat or esophagus (all triage questions negative) Does not want to swallow but denies actual diff swallowing. She has eaten a reg diet since friday. [place], RN, Langley Gauss 04/04/2014 3:00:15 PM Disp. Time Eilene Ghazi Time) Disposition Final User 04/04/2014 3:07:57 PM Call Completed Thad Ranger, RN, Langley Gauss PLEASE NOTE: All timestamps contained within this report are represented as [place] Standard Time. CONFIDENTIALTY NOTICE: This fax transmission is intended only for the addressee. It contains information that is legally privileged, confidential or otherwise protected from use or disclosure. If you are not the intended recipient, you are strictly prohibited from reviewing, disclosing, copying using or disseminating any of this information or taking any action in reliance on or regarding this information. If you have received this fax in error, please notify us immediately by telephone so that we can arrange for its return to [place]. Phone: [phone], Toll-Free: [phone], Fax: [phone] Page: 2 of 2 Call Id: [id number] 04/04/2014 3:04:04 [place], RN, Yevette Edwards Understands: Yes Disagree/Comply: Comply Care Advice Given Per Guideline HOME CARE: You should be able to treat this at home. TREATMENT FOR PILL STUCK: * First, try drinking some warm water (1/2 to 1 cup; 160-240 ml). * If this does not work, try eating a small amount of bread and then drinking some water. PREVENTING PILLS FROM GETTING STUCK - Here are some recommendations for helping you to swallow pills: * First, stand up or sit up. CALL BACK IF: * You become worse. CARE ADVICE given per Swallowed Difficulty (Adult) guideline. After Care Instructions Given Call Event Type User Date / Time Description

## 2014-05-08 ENCOUNTER — Encounter: Payer: Self-pay | Admitting: Internal Medicine

## 2014-05-08 ENCOUNTER — Ambulatory Visit (INDEPENDENT_AMBULATORY_CARE_PROVIDER_SITE_OTHER): Payer: BC Managed Care – PPO | Admitting: Internal Medicine

## 2014-05-08 VITALS — BP 100/70 | HR 80 | Temp 97.9°F | Resp 17 | Ht 64.5 in | Wt 149.8 lb

## 2014-05-08 DIAGNOSIS — J310 Chronic rhinitis: Secondary | ICD-10-CM

## 2014-05-08 MED ORDER — AMOXICILLIN 500 MG PO CAPS: 500.0000 mg | ORAL_CAPSULE | Freq: Three times a day (TID) | ORAL | Status: DC

## 2014-05-08 NOTE — Progress Notes (Signed)
   Subjective:    Patient ID: Regina [name], female    DOB: 1965/11/18, 49 y.o.   MRN: [id number]  HPI  Symptoms began 05/06/14 as malaise. As of 2/8 she developed pressure in the right ear associated with fatigue, chills, and postnasal drainage. She also had congestion and pain in the maxillary and ethmoid sinus areas. Additionally she describes itchy, watery eyes.  Her grandchildren who live with her are 5 months & 21 months; they are not ill @ this time.   She requested a work note for the rest of the week. Her concern is that she was out of work for 6 weeks last year following respiratory tract infection associated complications  Review of Systems She denies nasal obstruction, nasal purulence, loss of smell, halitosis, or otic discharge. She has no associated sputum production, cough, fever, or sweats.    Objective:   Physical Exam  General appearance:Adequately nourished; no acute distress or increased work of breathing is present.  No  lymphadenopathy about the head, neck, or axilla noted.   Eyes: No conjunctival inflammation or lid edema is present. There is no scleral icterus.  Ears:  External ear exam shows no significant lesions or deformities.  Otoscopic examination reveals clear canals, tympanic membranes are intact bilaterally without bulging, retraction, inflammation or discharge.  Nose:  External nasal examination shows no deformity or inflammation. Nasal mucosa are pink and moist without lesions or exudates. No septal dislocation or deviation.No obstruction to airflow.   Oral exam: Dental hygiene is good; lips and gums are healthy appearing.There is no oropharyngeal erythema or exudate noted.   Neck: Exquisitely tender to palpation anteriorly & posteriorly to the SCMusculature No deformities, thyromegaly, masses, or tenderness noted.   Supple with full range of motion without pain.   Heart:  Normal rate and regular rhythm. S1 and S2 normal without gallop, murmur, click,  rub or other extra sounds.   Lungs:Chest clear to auscultation; no wheezes, rhonchi,rales ,or rubs present.  Extremities:  No cyanosis, edema, or clubbing  noted    Skin: Warm & dry w/o jaundice or tenting.  Affect:somewhat  distant & non committal with discussion of findings & recommendations       Assessment & Plan:  #1 non allergic rhinitis, possibly viral #2 serous otitis  See Orders & AVS.  In the absence of findings suggesting communicable disease or serious infection; I told her that unfortunately I could not give  her a work release note for the rest the week. I did volunteer to do a CBC and differential; she declined this.

## 2014-05-08 NOTE — Patient Instructions (Signed)
Zicam Melts or Zinc lozenges as per package label for scratchy throat .  Complementary options to boost immunity include  vitamin C 2000 mg daily; & Echinacea for 4-7 days.  Fill the  prescription for antibiotic if fever; discolored nasal or chest secretions; or frontal headache or facial pain  present   Plain Mucinex (NOT D) for thick secretions ;force NON dairy fluids .   Nasal cleansing in the shower as discussed with lather of mild shampoo.After 10 seconds wash off lather while  exhaling through nostrils. Make sure that all residual soap is removed to prevent irritation.  Flonase OR Nasacort AQ 1 spray in each nostril twice a day as needed. Use the "crossover" technique into opposite nostril spraying toward opposite ear @ 45 degree angle, not straight up into nostril.  Plain Allegra (NOT D )  160 daily , Loratidine 10 mg , OR Zyrtec 10 mg @ bedtime  as needed for itchy eyes & sneezing.

## 2014-05-08 NOTE — Progress Notes (Signed)
Pre visit review using our clinic review tool, if applicable. No additional management support is needed unless otherwise documented below in the visit note. 

## 2014-06-29 ENCOUNTER — Ambulatory Visit (INDEPENDENT_AMBULATORY_CARE_PROVIDER_SITE_OTHER): Payer: BC Managed Care – PPO | Admitting: Family

## 2014-06-29 ENCOUNTER — Encounter: Payer: Self-pay | Admitting: Family

## 2014-06-29 VITALS — BP 98/62 | HR 84 | Temp 98.2°F | Ht 64.5 in | Wt 154.0 lb

## 2014-06-29 DIAGNOSIS — J320 Chronic maxillary sinusitis: Secondary | ICD-10-CM

## 2014-06-29 DIAGNOSIS — S93401A Sprain of unspecified ligament of right ankle, initial encounter: Secondary | ICD-10-CM

## 2014-06-29 DIAGNOSIS — S93409A Sprain of unspecified ligament of unspecified ankle, initial encounter: Secondary | ICD-10-CM | POA: Insufficient documentation

## 2014-06-29 MED ORDER — LEVOFLOXACIN 500 MG PO TABS: 500.0000 mg | ORAL_TABLET | Freq: Every day | ORAL | Status: DC

## 2014-06-29 NOTE — Assessment & Plan Note (Signed)
Symptoms and an exam consistent with bacterial sinusitis. Start Levaquin secondary to recent amoxicillin use. Continue over-the-counter medications as needed for symptom relief and supportive care. Follow-up if symptoms worsen or fail to improve.

## 2014-06-29 NOTE — Progress Notes (Signed)
Subjective:    Patient ID: Regina [name], female    DOB: 01/12/1966, 49 y.o.   MRN: [id number]  Chief Complaint  Patient presents with  . Sinusitis    with ear and head ache and pressure  . Ankle Injury    HPI:  Regina [name] is a 49 y.o. female who presents today for an acute visit.    1) Sinus pressure - This is a new problem. Associated symptom sinus pressure, ear pain, jaw pain, and congestion has been going on for about 2 months now. Has tried Claritin and mucinex for several days and has progressively worsened. Was previously treated for allergic rhinitis and completed a dose of amoxicillin.   2) Ankle pain - This is a new problem. Associated symptom of pain and edema located in her right ankle has been going on for about a week and half. Reports she inverted her ankle. States she had x-rays completed at a chiropractic office and there was no evidence of fracture. Modifying factors of ice and compression have helped some. She is able to weight bear.    Allergies  Allergen Reactions  . Erythromycin Stearate Hives and Swelling    REACTION: all mycins  . Morphine Sulfate Nausea And Vomiting  . Sulfacetamide Sodium Swelling  . Indocin [Indomethacin] Swelling, Rash and Other (See Comments)    REACTION: lupus like illness; butterfly rash, pain and swelling all over body.     Current Outpatient Prescriptions on File Prior to Visit  Medication Sig Dispense Refill  . Cholecalciferol (GNP VITAMIN D) 1000 UNITS tablet Take 1 tablet (1,000 Units total) by mouth daily. 100 tablet 3  . Cyanocobalamin (VITAMIN B-12) 1000 MCG SUBL Place 1 tablet (1,000 mcg total) under the tongue daily. 100 tablet 3  . Probiotic Product (ALIGN) 4 MG CAPS Take 1 capsule by mouth daily. 14 capsule 0  . psyllium (REGULOID) 0.52 G capsule Take 0.52 g by mouth daily.    Marland Kitchen amoxicillin (AMOXIL) 500 MG capsule Take 1 capsule (500 mg total) by mouth 3 (three) times daily. (Patient not taking: Reported on  06/29/2014) 21 capsule 0   No current facility-administered medications on file prior to visit.    Past Medical History  Diagnosis Date  . B12 DEFICIENCY   . Rosacea   . Allergic urticaria   . ARTHRALGIA   . Osteoarthritis   . Gastritis   . GERD   . CFS (chronic fatigue syndrome)   . Fibromyalgia   . Internal hemorrhoids   . MVP (mitral valve prolapse)   . History of endometriosis     Review of Systems  Constitutional: Negative for fever and chills.  HENT: Positive for congestion, ear pain and sinus pressure. Negative for sore throat.   Respiratory: Positive for cough.   Neurological: Positive for headaches.      Objective:    Ht 5' 4.5" (1.638 m)  Wt 154 lb (69.854 kg)  BMI 26.04 kg/m2 Nursing note and vital signs reviewed.  Physical Exam  Constitutional: She is oriented to person, place, and time. She appears well-developed and well-nourished. No distress.  HENT:  Right Ear: Hearing, tympanic membrane, external ear and ear canal normal.  Left Ear: Hearing, tympanic membrane, external ear and ear canal normal.  Nose: Right sinus exhibits no maxillary sinus tenderness and no frontal sinus tenderness. Left sinus exhibits maxillary sinus tenderness. Left sinus exhibits no frontal sinus tenderness.  Mouth/Throat: Uvula is midline, oropharynx is clear and moist and mucous membranes  are normal.  Neck: Neck supple.  Cardiovascular: Normal rate, regular rhythm, normal heart sounds and intact distal pulses.   Pulmonary/Chest: Effort normal and breath sounds normal.  Musculoskeletal:  No obvious deformity of right ankle noted. Moderate amount of edema and mild discoloration present. Palpable tenderness along calcaneofibular and posterior talofibular ligament. Range of motion is decreased in plantar flexion secondary to pain. Anterior drawer is negative. Talar tilt is positive for pain. Pulses and sensation are intact and appropriate.  Lymphadenopathy:    She has cervical  adenopathy.  Neurological: She is alert and oriented to person, place, and time.  Skin: Skin is warm and dry.  Psychiatric: She has a normal mood and affect. Her behavior is normal. Judgment and thought content normal.       Assessment & Plan:

## 2014-06-29 NOTE — Progress Notes (Signed)
Pre visit review using our clinic review tool, if applicable. No additional management support is needed unless otherwise documented below in the visit note. 

## 2014-06-29 NOTE — Patient Instructions (Addendum)
Thank you for choosing [facility].  Summary/Instructions:  Your prescription(s) have been submitted to your pharmacy or been printed and provided for you. Please take as directed and contact our office if you believe you are having problem(s) with the medication(s) or have any questions.  If your symptoms worsen or fail to improve, please contact our office for further instruction, or in case of emergency go directly to the emergency room at the closest medical facility.    Sinusitis Sinusitis is redness, soreness, and inflammation of the paranasal sinuses. Paranasal sinuses are air pockets within the bones of your face (beneath the eyes, the middle of the forehead, or above the eyes). In healthy paranasal sinuses, mucus is able to drain out, and air is able to circulate through them by way of your nose. However, when your paranasal sinuses are inflamed, mucus and air can become trapped. This can allow bacteria and other germs to grow and cause infection. Sinusitis can develop quickly and last only a short time (acute) or continue over a long period (chronic). Sinusitis that lasts for more than 12 weeks is considered chronic.  CAUSES  Causes of sinusitis include:  Allergies.  Structural abnormalities, such as displacement of the cartilage that separates your nostrils (deviated septum), which can decrease the air flow through your nose and sinuses and affect sinus drainage.  Functional abnormalities, such as when the small hairs (cilia) that line your sinuses and help remove mucus do not work properly or are not present. SIGNS AND SYMPTOMS  Symptoms of acute and chronic sinusitis are the same. The primary symptoms are pain and pressure around the affected sinuses. Other symptoms include:  Upper toothache.  Earache.  Headache.  Bad breath.  Decreased sense of smell and taste.  A cough, which worsens when you are lying flat.  Fatigue.  Fever.  Thick drainage from your  nose, which often is green and may contain pus (purulent).  Swelling and warmth over the affected sinuses. DIAGNOSIS  Your health care provider will perform a physical exam. During the exam, your health care provider may:  Look in your nose for signs of abnormal growths in your nostrils (nasal polyps).  Tap over the affected sinus to check for signs of infection.  View the inside of your sinuses (endoscopy) using an imaging device that has a light attached (endoscope). If your health care provider suspects that you have chronic sinusitis, one or more of the following tests may be recommended:  Allergy tests.  Nasal culture. A sample of mucus is taken from your nose, sent to a lab, and screened for bacteria.  Nasal cytology. A sample of mucus is taken from your nose and examined by your health care provider to determine if your sinusitis is related to an allergy. TREATMENT  Most cases of acute sinusitis are related to a viral infection and will resolve on their own within 10 days. Sometimes medicines are prescribed to help relieve symptoms (pain medicine, decongestants, nasal steroid sprays, or saline sprays).  However, for sinusitis related to a bacterial infection, your health care provider will prescribe antibiotic medicines. These are medicines that will help kill the bacteria causing the infection.  Rarely, sinusitis is caused by a fungal infection. In theses cases, your health care provider will prescribe antifungal medicine. For some cases of chronic sinusitis, surgery is needed. Generally, these are cases in which sinusitis recurs more than 3 times per year, despite other treatments. HOME CARE INSTRUCTIONS   Drink plenty of water. Water  helps thin the mucus so your sinuses can drain more easily.  Use a humidifier.  Inhale steam 3 to 4 times a day (for example, sit in the bathroom with the shower running).  Apply a warm, moist washcloth to your face 3 to 4 times a day, or as  directed by your health care provider.  Use saline nasal sprays to help moisten and clean your sinuses.  Take medicines only as directed by your health care provider.  If you were prescribed either an antibiotic or antifungal medicine, finish it all even if you start to feel better. SEEK IMMEDIATE MEDICAL CARE IF:  You have increasing pain or severe headaches.  You have nausea, vomiting, or drowsiness.  You have swelling around your face.  You have vision problems.  You have a stiff neck.  You have difficulty breathing. MAKE SURE YOU:   Understand these instructions.  Will watch your condition.  Will get help right away if you are not doing well or get worse. Document Released: 03/16/2005 Document Revised: 07/31/2013 Document Reviewed: 03/31/2011 [facility] Patient Information 2015 [place], [place]. This information is not intended to replace advice given to you by your health care provider. Make sure you discuss any questions you have with your health care provider.   Ankle Exercises EXERCISES RANGE OF MOTION (ROM) AND STRETCHING EXERCISES These exercises may help you when beginning to rehabilitate your injury. Your symptoms may resolve with or without further involvement from your physician, physical therapist or athletic trainer. While completing these exercises, remember:   Restoring tissue flexibility helps normal motion to return to the joints. This allows healthier, less painful movement and activity.  An effective stretch should be held for at least 30 seconds.  A stretch should never be painful. You should only feel a gentle lengthening or release in the stretched tissue. RANGE OF MOTION - Dorsi/Plantar Flexion  While sitting with your right / left knee straight, draw the top of your foot upwards by flexing your ankle. Then reverse the motion, pointing your toes downward.  Hold each position for __________ seconds.  After completing your first set of exercises,  repeat this exercise with your knee bent. Repeat __________ times. Complete this exercise __________ times per day.  RANGE OF MOTION - Ankle Alphabet  Imagine your right / left big toe is a pen.  Keeping your hip and knee still, write out the entire alphabet with your "pen." Make the letters as large as you can without increasing any discomfort. Repeat __________ times. Complete this exercise __________ times per day.  RANGE OF MOTION - Ankle Dorsiflexion, Active Assisted   Remove shoes and sit on a chair that is preferably not on a carpeted surface.  Place right / left foot under knee. Extend your opposite leg for support.  Keeping your heel down, slide your right / left foot back toward the chair until you feel a stretch at your ankle or calf. If you do not feel a stretch, slide your bottom forward to the edge of the chair while still keeping your heel down.  Hold this stretch for __________ seconds. Repeat __________ times. Complete this stretch __________ times per day.  STRENGTHENING EXERCISES  These exercises may help you when beginning to rehabilitate your injury. They may resolve your symptoms with or without further involvement from your physician, physical therapist or athletic trainer. While completing these exercises, remember:   Muscles can gain both the endurance and the strength needed for everyday activities through controlled exercises.  Complete these exercises as instructed by your physician, physical therapist or athletic trainer. Progress the resistance and repetitions only as guided.  You may experience muscle soreness or fatigue, but the pain or discomfort you are trying to eliminate should never worsen during these exercises. If this pain does worsen, stop and make certain you are following the directions exactly. If the pain is still present after adjustments, discontinue the exercise until you can discuss the trouble with your clinician. STRENGTH -  Dorsiflexors  Secure a rubber exercise band/tubing to a fixed object (table, pole) and loop the other end around your right / left foot.  Sit on the floor facing the fixed object. The band/tubing should be slightly tense when your foot is relaxed.  Slowly draw your foot back toward you using your ankle and toes.  Hold this position for __________ seconds. Slowly release the tension in the band and return your foot to the starting position. Repeat __________ times. Complete this exercise __________ times per day.  STRENGTH - Plantar-flexors  Sit with your right / left leg extended. Holding onto both ends of a rubber exercise band/tubing, loop it around the ball of your foot. Keep a slight tension in the band.  Slowly push your toes away from you, pointing them downward.  Hold this position for __________ seconds. Return slowly, controlling the tension in the band/tubing. Repeat __________ times. Complete this exercise __________ times per day.  STRENGTH - Ankle Eversion  Secure one end of a rubber exercise band/tubing to a fixed object (table, pole). Loop the other end around your foot just before your toes.  Place your fists between your knees. This will focus your strengthening at your ankle.  Drawing the band/tubing across your opposite foot, slowly, pull your little toe out and up. Make sure the band/tubing is positioned to resist the entire motion.  Hold this position for __________ seconds.  Have your muscles resist the band/tubing as it slowly pulls your foot back to the starting position. Repeat __________ times. Complete this exercise __________ times per day.  STRENGTH - Ankle Inversion  Secure one end of a rubber exercise band/tubing to a fixed object (table, pole). Loop the other end around your foot just before your toes.  Place your fists between your knees. This will focus your strengthening at your ankle.  Slowly, pull your big toe up and in, making sure the  band/tubing is positioned to resist the entire motion.  Hold this position for __________ seconds.  Have your muscles resist the band/tubing as it slowly pulls your foot back to the starting position. Repeat __________ times. Complete this exercises __________ times per day.  STRENGTH - Towel Curls  Sit in a chair positioned on a non-carpeted surface.  Place your foot on a towel, keeping your heel on the floor.  Pull the towel toward your heel by only curling your toes. Keep your heel on the floor. If instructed by your physician, physical therapist or athletic trainer, add weight to the end of the towel. Repeat __________ times. Complete this exercise __________ times per day. STRENGTH - Plantar-flexors, Standing  Stand with your feet shoulder width apart. Steady yourself with a wall or table using as little support as needed.  Keeping your weight evenly spread over the width of your feet, rise up on your toes.*  Hold this position for __________ seconds. Repeat __________ times. Complete this exercise __________ times per day.  *If this is too easy, shift your weight toward your right /  left leg until you feel challenged. Ultimately, you may be asked to do this exercise with your right / left foot only. BALANCE - Tandem Walking  Place your uninjured foot on a line 2-4 inches wide and at least 10 feet long.  Keeping your balance without using anything for extra support, place your right / left heel directly in front of your other foot.  Slowly raise your back foot up, lifting from the heel to the toes, and place it directly in front of the right / left foot.  Continue to walk along the line slowly. Walk for ____________________ feet. Repeat ____________________ times. Complete ____________________ times per day. Document Released: 01/28/2005 Document Revised: 06/08/2011 Document Reviewed: 06/28/2008 [facility] Patient Information 2015 [place], [place]. This information is not  intended to replace advice given to you by your health care provider. Make sure you discuss any questions you have with your health care provider.

## 2014-06-29 NOTE — Assessment & Plan Note (Signed)
Symptoms and exam consistent with lateral ankle sprain. Previous x-rays completed were negative for fracture per patient. Continue with ice, compression and elevation. Range of motion exercises and general ankle exercises provided to patient. Advised purchasing an ankle brace for support. Follow-up if symptoms worsen or fail to improve.

## 2014-07-04 ENCOUNTER — Telehealth: Payer: Self-pay | Admitting: Internal Medicine

## 2014-07-04 NOTE — Telephone Encounter (Signed)
Patient was in to see Marya Amsler and was given levofloxacin (LEVAQUIN) 500 MG tablet [[id number]]. She felt better the first couple of days but now the pressure is back in her head and she still feels bad. Tomorrow is her last dose and wondering if she should take more or come in to see him again.

## 2014-07-04 NOTE — Telephone Encounter (Signed)
Pt aware.

## 2014-07-04 NOTE — Telephone Encounter (Signed)
Please have her complete the antibiotics and if she continues to have symptoms after completion, we can send in a different antibiotic.

## 2014-07-23 ENCOUNTER — Ambulatory Visit (INDEPENDENT_AMBULATORY_CARE_PROVIDER_SITE_OTHER): Payer: BC Managed Care – PPO

## 2014-07-23 ENCOUNTER — Encounter: Payer: Self-pay | Admitting: Podiatry

## 2014-07-23 ENCOUNTER — Ambulatory Visit (INDEPENDENT_AMBULATORY_CARE_PROVIDER_SITE_OTHER): Payer: BC Managed Care – PPO | Admitting: Podiatry

## 2014-07-23 VITALS — BP 114/62 | HR 76 | Resp 18

## 2014-07-23 DIAGNOSIS — R52 Pain, unspecified: Secondary | ICD-10-CM

## 2014-07-23 DIAGNOSIS — S93401A Sprain of unspecified ligament of right ankle, initial encounter: Secondary | ICD-10-CM

## 2014-07-23 NOTE — Progress Notes (Signed)
   Subjective:    Patient ID: Regina [name], female    DOB: [birth year], 49 y.o.   MRN: [id number]  HPI i stepped on a root when i was walking about four weeks ago and is tender to the touch and a little bruised still and i had a boot and wore it 2 weeks and swells and i did use an ace wrap and went to the chiropractor doctor when it happened    Review of Systems  All other systems reviewed and are negative.      Objective:   Physical Exam        Assessment & Plan:

## 2014-07-30 ENCOUNTER — Other Ambulatory Visit (INDEPENDENT_AMBULATORY_CARE_PROVIDER_SITE_OTHER): Payer: BC Managed Care – PPO

## 2014-07-30 ENCOUNTER — Telehealth: Payer: Self-pay | Admitting: Internal Medicine

## 2014-07-30 DIAGNOSIS — R55 Syncope and collapse: Secondary | ICD-10-CM | POA: Diagnosis not present

## 2014-07-30 LAB — URINALYSIS, ROUTINE W REFLEX MICROSCOPIC
Bilirubin Urine: NEGATIVE
KETONES UR: NEGATIVE
LEUKOCYTES UA: NEGATIVE
Nitrite: NEGATIVE
Specific Gravity, Urine: 1.015 (ref 1.000–1.030)
Total Protein, Urine: NEGATIVE
Urine Glucose: NEGATIVE
Urobilinogen, UA: 0.2 (ref 0.0–1.0)
pH: 6.5 (ref 5.0–8.0)

## 2014-07-30 LAB — TSH: TSH: 1.03 u[IU]/mL (ref 0.35–4.50)

## 2014-07-30 LAB — CBC WITH DIFFERENTIAL/PLATELET
Basophils Absolute: 0 10*3/uL (ref 0.0–0.1)
Basophils Relative: 0.6 % (ref 0.0–3.0)
Eosinophils Absolute: 0 10*3/uL (ref 0.0–0.7)
Eosinophils Relative: 0.1 % (ref 0.0–5.0)
HCT: 37 % (ref 36.0–46.0)
HEMOGLOBIN: 12.6 g/dL (ref 12.0–15.0)
LYMPHS PCT: 32.3 % (ref 12.0–46.0)
Lymphs Abs: 2.4 10*3/uL (ref 0.7–4.0)
MCHC: 34 g/dL (ref 30.0–36.0)
MCV: 87.3 fl (ref 78.0–100.0)
MONO ABS: 0.5 10*3/uL (ref 0.1–1.0)
Monocytes Relative: 7.3 % (ref 3.0–12.0)
NEUTROS ABS: 4.4 10*3/uL (ref 1.4–7.7)
Neutrophils Relative %: 59.7 % (ref 43.0–77.0)
Platelets: 339 10*3/uL (ref 150.0–400.0)
RBC: 4.24 Mil/uL (ref 3.87–5.11)
RDW: 13.5 % (ref 11.5–15.5)
WBC: 7.4 10*3/uL (ref 4.0–10.5)

## 2014-07-30 LAB — BASIC METABOLIC PANEL
BUN: 13 mg/dL (ref 6–23)
CO2: 28 mEq/L (ref 19–32)
CREATININE: 0.7 mg/dL (ref 0.40–1.20)
Calcium: 9.3 mg/dL (ref 8.4–10.5)
Chloride: 104 mEq/L (ref 96–112)
GFR: 94.42 mL/min (ref >60.00)
GLUCOSE: 91 mg/dL (ref 70–99)
Potassium: 3.7 mEq/L (ref 3.5–5.1)
Sodium: 138 mEq/L (ref 135–145)

## 2014-07-30 LAB — CREATININE KINASE MB: CK-MB: 1.1 ng/mL (ref 0.3–4.0)

## 2014-07-30 LAB — VITAMIN B12: Vitamin B-12: 149 pg/mL — ABNORMAL LOW (ref 211–911)

## 2014-07-30 NOTE — Telephone Encounter (Signed)
Notified pt with md response.../lmb 

## 2014-07-30 NOTE — Telephone Encounter (Signed)
Patient nearly passed out on Saturday and thinks she may had a fib attack. I made an appointment for tomorrow morning and she was wondering if she could get her blood work done today to see if she may have had a heart attack. Please advise patient.

## 2014-07-30 NOTE — Telephone Encounter (Signed)
OK Go to ER if sick Thx

## 2014-07-31 ENCOUNTER — Ambulatory Visit (INDEPENDENT_AMBULATORY_CARE_PROVIDER_SITE_OTHER): Payer: BC Managed Care – PPO | Admitting: Internal Medicine

## 2014-07-31 ENCOUNTER — Encounter: Payer: Self-pay | Admitting: Internal Medicine

## 2014-07-31 VITALS — BP 118/70 | HR 58 | Wt 159.0 lb

## 2014-07-31 DIAGNOSIS — R55 Syncope and collapse: Secondary | ICD-10-CM | POA: Diagnosis not present

## 2014-07-31 DIAGNOSIS — R000 Tachycardia, unspecified: Secondary | ICD-10-CM | POA: Diagnosis not present

## 2014-07-31 DIAGNOSIS — E538 Deficiency of other specified B group vitamins: Secondary | ICD-10-CM

## 2014-07-31 MED ORDER — CYANOCOBALAMIN 1000 MCG/ML IJ SOLN
1000.0000 ug | Freq: Once | INTRAMUSCULAR | Status: AC
  Administered 2014-07-31: 1000 ug via INTRAMUSCULAR

## 2014-07-31 MED ORDER — ATENOLOL 50 MG PO TABS: 50.0000 mg | ORAL_TABLET | Freq: Two times a day (BID) | ORAL | Status: DC | PRN

## 2014-07-31 NOTE — Assessment & Plan Note (Signed)
5/16 probable SVT Appt w/Dr Tamala Julian Atenolol prn Labs reviewed

## 2014-07-31 NOTE — Progress Notes (Signed)
Subjective:   Near Syncope This is a new problem. The current episode started in the past 7 days. The problem occurs rarely. The problem has been resolved. Associated symptoms include fatigue, vertigo and weakness. Pertinent negatives include no abdominal pain, chest pain, chills, congestion, coughing, headaches, nausea, numbness or rash. She has tried nothing for the symptoms.  Back Pain Associated symptoms include weakness. Pertinent negatives include no abdominal pain, chest pain, dysuria, headaches or numbness.    F/u FMS, IBS, fatigue x long time all the time. Stress is worse: sick parents, paraplegic brother... F/u wt gain F/u Vit B12 F/u abn chest findings on abd CT F/o ovar cyst - resolved  BP Readings from Last 3 Encounters:  07/31/14 118/70  07/23/14 114/62  06/29/14 98/62   Wt Readings from Last 3 Encounters:  07/31/14 159 lb (72.122 kg)  06/29/14 154 lb (69.854 kg)  05/08/14 149 lb 12 oz (67.926 kg)      Review of Systems  Constitutional: Positive for fatigue and unexpected weight change. Negative for chills, activity change and appetite change.  HENT: Negative for congestion, mouth sores and sinus pressure.   Eyes: Negative for visual disturbance.  Respiratory: Positive for wheezing. Negative for cough and chest tightness.   Cardiovascular: Positive for near-syncope. Negative for chest pain.  Gastrointestinal: Positive for constipation and abdominal distention. Negative for nausea, abdominal pain and diarrhea.  Genitourinary: Negative for dysuria, frequency, difficulty urinating, genital sores and vaginal pain.  Musculoskeletal: Positive for back pain. Negative for gait problem.  Skin: Negative for pallor and rash.  Neurological: Positive for vertigo and weakness. Negative for dizziness, tremors, light-headedness, numbness and headaches.  Psychiatric/Behavioral: Negative for suicidal ideas, confusion and sleep disturbance. The patient is nervous/anxious.         Objective:   Physical Exam  Constitutional: She appears well-developed. No distress.  HENT:  Head: Normocephalic.  Right Ear: External ear normal.  Left Ear: External ear normal.  Nose: Nose normal.  Mouth/Throat: Oropharynx is clear and moist.  Eyes: Conjunctivae are normal. Pupils are equal, round, and reactive to light. Right eye exhibits no discharge. Left eye exhibits no discharge.  Neck: Normal range of motion. Neck supple. No JVD present. No tracheal deviation present. No thyromegaly present.  Cardiovascular: Normal rate, regular rhythm and normal heart sounds.   Pulmonary/Chest: No stridor. No respiratory distress. She has no wheezes.  Abdominal: Soft. Bowel sounds are normal. She exhibits no distension and no mass. There is no tenderness. There is no rebound and no guarding.  Musculoskeletal: She exhibits no edema or tenderness.  Lymphadenopathy:    She has no cervical adenopathy.  Neurological: She displays normal reflexes. No cranial nerve deficit. She exhibits normal muscle tone. Coordination normal.  Skin: No rash noted. No erythema.  Psychiatric: She has a normal mood and affect. Her behavior is normal. Judgment and thought content normal.     Lab Results  Component Value Date   WBC 7.4 07/30/2014   HGB 12.6 07/30/2014   HCT 37.0 07/30/2014   PLT 339.0 07/30/2014   GLUCOSE 91 07/30/2014   CHOL 174 03/18/2010   TRIG 48.0 03/18/2010   HDL 54.50 03/18/2010   LDLCALC 110* 03/18/2010   ALT 12 01/26/2013   AST 13 01/26/2013   NA 138 07/30/2014   K 3.7 07/30/2014   CL 104 07/30/2014   CREATININE 0.70 07/30/2014   BUN 13 07/30/2014   CO2 28 07/30/2014   TSH 1.03 07/30/2014   EKG     Assessment &  Plan:

## 2014-07-31 NOTE — Progress Notes (Signed)
Pre visit review using our clinic review tool, if applicable. No additional management support is needed unless otherwise documented below in the visit note. 

## 2014-08-01 ENCOUNTER — Other Ambulatory Visit: Payer: Self-pay | Admitting: Internal Medicine

## 2014-08-01 MED ORDER — CYANOCOBALAMIN 1000 MCG/ML IJ SOLN: 1000.0000 ug | INTRAMUSCULAR | Status: DC

## 2014-08-08 NOTE — Assessment & Plan Note (Signed)
Low Vit B12 - start sq inj

## 2014-08-13 ENCOUNTER — Ambulatory Visit: Payer: BC Managed Care – PPO

## 2014-08-15 ENCOUNTER — Ambulatory Visit (INDEPENDENT_AMBULATORY_CARE_PROVIDER_SITE_OTHER): Payer: BC Managed Care – PPO

## 2014-08-15 ENCOUNTER — Telehealth: Payer: Self-pay

## 2014-08-15 DIAGNOSIS — E538 Deficiency of other specified B group vitamins: Secondary | ICD-10-CM

## 2014-08-15 MED ORDER — CYANOCOBALAMIN 1000 MCG/ML IJ SOLN
1000.0000 ug | Freq: Once | INTRAMUSCULAR | Status: AC
  Administered 2014-08-15: 1000 ug via INTRAMUSCULAR

## 2014-08-15 NOTE — Telephone Encounter (Signed)
Pt is in today for her b12 She is requesting to have the b12 done every week for the next four weeks. Is this okay to schedule her for it.

## 2014-08-15 NOTE — Telephone Encounter (Signed)
Ok q 1 wk x4 wks, then q 2 weeks Thx

## 2014-09-05 ENCOUNTER — Encounter: Payer: Self-pay | Admitting: *Deleted

## 2014-09-06 DIAGNOSIS — I4719 Other supraventricular tachycardia: Secondary | ICD-10-CM | POA: Insufficient documentation

## 2014-09-06 DIAGNOSIS — I471 Supraventricular tachycardia: Secondary | ICD-10-CM | POA: Insufficient documentation

## 2014-09-07 ENCOUNTER — Encounter: Payer: Self-pay | Admitting: Interventional Cardiology

## 2014-09-07 ENCOUNTER — Ambulatory Visit (INDEPENDENT_AMBULATORY_CARE_PROVIDER_SITE_OTHER): Payer: BC Managed Care – PPO | Admitting: Interventional Cardiology

## 2014-09-07 VITALS — BP 90/54 | HR 80 | Ht 64.5 in | Wt 164.4 lb

## 2014-09-07 DIAGNOSIS — R000 Tachycardia, unspecified: Secondary | ICD-10-CM | POA: Diagnosis not present

## 2014-09-07 DIAGNOSIS — R0789 Other chest pain: Secondary | ICD-10-CM

## 2014-09-07 DIAGNOSIS — I471 Supraventricular tachycardia: Secondary | ICD-10-CM

## 2014-09-07 DIAGNOSIS — I480 Paroxysmal atrial fibrillation: Secondary | ICD-10-CM

## 2014-09-07 NOTE — Patient Instructions (Signed)
Medication Instructions:  Your physician recommends that you continue on your current medications as directed. Please refer to the Current Medication list given to you today.   Labwork: None   Testing/Procedures: Your physician has requested that you have an echocardiogram. Echocardiography is a painless test that uses sound waves to create images of your heart. It provides your doctor with information about the size and shape of your heart and how well your heart's chambers and valves are working. This procedure takes approximately one hour. There are no restrictions for this procedure.   Follow-Up: Your physician recommends that you schedule a follow-up appointment as needed  Any Other Special Instructions Will Be Listed Below (If Applicable).

## 2014-09-07 NOTE — Progress Notes (Signed)
Cardiology Office Note   Date:  09/07/2014   ID:  [name], DOB [birth year], MRN [id number]  PCP:  Walker Kehr, MD  Cardiologist:  Sinclair Grooms, MD   Chief Complaint  Patient presents with  . Tachycardia      History of Present Illness: Regina [name] is a 49 y.o. female who presents for prior history of palpitations, tachycardia (20 years ago) and now a recurrent episode of paroxysmal rapid heart rate associated with chest tightness. This occurred 3 weeks ago. Episode lasted 2-3 minutes. Chest felt tight during the episode. She also felt as though she may faint. She had an identical episode 20 years ago while working at [employer]. By the time EMS arrived the arrhythmia broke. On that occasion 20 years ago it lasted approximately 10 minutes.  She has had no difficulty since the most recent episode. She is here because her physician and family have urged her to be rechecked.  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 B12 DEFICIENCY   . Rosacea   . Allergic urticaria   . ARTHRALGIA   . Osteoarthritis   . Gastritis   . GERD   . CFS (chronic fatigue syndrome)   . Fibromyalgia   . Internal hemorrhoids   . MVP (mitral valve prolapse)   . History of endometriosis     Past Surgical History  Procedure Laterality Date  . Appendectomy    . Abdominal hysterectomy      partial  . Ovarian cyst removal    . Uterine fibroid surgery    . Hemorrhoid surgery      Dr. Rise [name] 2009  . Anal fissure repair      Dr Rise [name] 2009  . Tubal ligation    . Rt bunionectomt    . Tonsillectomy and adenoidectomy       Current Outpatient Prescriptions  Medication Sig Dispense Refill  . atenolol (TENORMIN) 50 MG tablet Take 1 tablet (50 mg total) by mouth 2 (two) times daily as needed. 30 tablet 3  . Cholecalciferol (GNP VITAMIN D) 1000 UNITS tablet Take 1 tablet (1,000 Units total) by  mouth daily. 100 tablet 3  . cyanocobalamin (COBAL-1000) 1000 MCG/ML injection Inject 1 mL (1,000 mcg total) into the skin every 14 (fourteen) days. 10 mL 6  . Probiotic Product (ALIGN) 4 MG CAPS Take 1 capsule by mouth daily. 14 capsule 0  . psyllium (REGULOID) 0.52 G capsule Take 0.52 g by mouth daily.     No current facility-administered medications for this visit.    Allergies:   Indocin; Morphine sulfate; Sulfacetamide sodium; and Erythromycin stearate    Social History:  The patient  reports that she has quit smoking. She has never used smokeless tobacco. She reports that she does not drink alcohol or use illicit drugs.   Family History:  The patient's family history includes Cancer in her mother; Diabetes in an other family member; Healthy in her father; Lung cancer in her mother; Thyroid cancer in her mother. There is no history of Asthma.    ROS:  Please see the history of present illness.   Otherwise, review of systems are positive for none.   All other systems are reviewed and negative.    PHYSICAL EXAM: VS:  BP 90/54 mmHg  Pulse 80  Ht 5' 4.5" (1.638 m)  Wt 74.571 kg (164 lb 6.4 oz)  BMI  27.79 kg/m2  SpO2 99% , BMI Body mass index is 27.79 kg/(m^2). GEN: Well nourished, well developed, in no acute distress HEENT: normal Neck: no JVD, carotid bruits, or masses Cardiac: RRR; no murmurs, rubs, or gallops,no edema  Respiratory:  clear to auscultation bilaterally, normal work of breathing GI: soft, nontender, nondistended, + BS MS: no deformity or atrophy Skin: warm and dry, no rash Neuro:  Strength and sensation are intact Psych: euthymic mood, full affect   EKG:  EKG is not ordered today. The ekg done within the past 2 weeks by her PCP is unremarkable   Recent Labs: 07/30/2014: BUN 13; Creatinine, Ser 0.70; Hemoglobin 12.6; Platelets 339.0; Potassium 3.7; Sodium 138; TSH 1.03    Lipid Panel    Component Value Date/Time   CHOL 174 03/18/2010 0735   TRIG 48.0  03/18/2010 0735   HDL 54.50 03/18/2010 0735   CHOLHDL 3 03/18/2010 0735   VLDL 9.6 03/18/2010 0735   LDLCALC 110* 03/18/2010 0735      Wt Readings from Last 3 Encounters:  09/07/14 74.571 kg (164 lb 6.4 oz)  07/31/14 72.122 kg (159 lb)  06/29/14 69.854 kg (154 lb)      Other studies Reviewed: Additional studies/ records that were reviewed today include: .    ASSESSMENT AND PLAN:  Paroxymal atrial tachycardia- presumed  History of heart murmur at birth that resolved over time. Possible VSD  Chest heaviness during tachycardia     Current medicines are reviewed at length with the patient today.  The patient does not have concerns regarding medicines.  The following changes have been made:  no change. Our diagnostic plan is to perform an echocardiogram to rule out a structural abnormality. Clinical observation otherwise with further workup depending upon the frequency of episodes. Last prior episode was 20 years ago.  Labs/ tests ordered today include:   Orders Placed This Encounter  Procedures  . Echocardiogram     Disposition:   FU with HS in  prn  Signed, Sinclair Grooms, MD  09/07/2014 12:28 PM    [address], [place], [place]  [address] Phone: [phone]; Fax: [phone]

## 2014-09-24 ENCOUNTER — Other Ambulatory Visit: Payer: Self-pay

## 2014-09-24 ENCOUNTER — Ambulatory Visit (HOSPITAL_COMMUNITY): Payer: BC Managed Care – PPO | Attending: Interventional Cardiology

## 2014-09-24 DIAGNOSIS — I340 Nonrheumatic mitral (valve) insufficiency: Secondary | ICD-10-CM | POA: Insufficient documentation

## 2014-09-24 DIAGNOSIS — I471 Supraventricular tachycardia: Secondary | ICD-10-CM

## 2014-09-26 ENCOUNTER — Telehealth: Payer: Self-pay

## 2014-09-26 NOTE — Telephone Encounter (Signed)
-----   Message from Belva Crome, MD sent at 09/25/2014  6:14 PM EDT ----- Mild to moderate leak of the mitral valve.

## 2014-09-27 ENCOUNTER — Other Ambulatory Visit: Payer: Self-pay | Admitting: *Deleted

## 2014-09-27 ENCOUNTER — Other Ambulatory Visit: Payer: Self-pay

## 2014-09-27 DIAGNOSIS — I340 Nonrheumatic mitral (valve) insufficiency: Secondary | ICD-10-CM

## 2014-09-27 DIAGNOSIS — I480 Paroxysmal atrial fibrillation: Secondary | ICD-10-CM

## 2014-10-10 ENCOUNTER — Telehealth: Payer: Self-pay

## 2014-10-10 ENCOUNTER — Ambulatory Visit (INDEPENDENT_AMBULATORY_CARE_PROVIDER_SITE_OTHER): Payer: BC Managed Care – PPO

## 2014-10-10 DIAGNOSIS — E538 Deficiency of other specified B group vitamins: Secondary | ICD-10-CM | POA: Diagnosis not present

## 2014-10-10 MED ORDER — CYANOCOBALAMIN 1000 MCG/ML IJ SOLN: 1000.0000 ug | INTRAMUSCULAR | Status: DC

## 2014-10-10 MED ORDER — CYANOCOBALAMIN 1000 MCG/ML IJ SOLN
1000.0000 ug | Freq: Once | INTRAMUSCULAR | Status: AC
  Administered 2014-10-10: 1000 ug via INTRAMUSCULAR

## 2014-10-10 MED ORDER — "SYRINGE/NEEDLE (DISP) 25G X 1"" 3 ML MISC": Status: DC

## 2014-10-10 NOTE — Telephone Encounter (Signed)
Pt states today at her nurse visit that she previously discuss administering b-12 injections at home with PCP. Pt is requesting appropriate Rx's for home injections.

## 2014-10-10 NOTE — Telephone Encounter (Signed)
Ok Done Thx 

## 2014-10-11 ENCOUNTER — Ambulatory Visit: Payer: BC Managed Care – PPO

## 2014-10-16 ENCOUNTER — Encounter: Payer: Self-pay | Admitting: Internal Medicine

## 2014-10-22 ENCOUNTER — Telehealth: Payer: Self-pay | Admitting: Internal Medicine

## 2014-10-22 MED ORDER — CYANOCOBALAMIN 1000 MCG/ML IJ SOLN: 1000.0000 ug | INTRAMUSCULAR | Status: DC

## 2014-10-22 NOTE — Telephone Encounter (Signed)
Patient states pharmacy received syringes but did not receive script for b12 last week.  Patient is requesting to be sent.  [facility] [place].  Patient states pharmacy has tried faxing several times in the last 3 or 4 days.

## 2014-10-22 NOTE — Telephone Encounter (Signed)
Rx sent. See meds. Pt informed.  

## 2014-11-13 ENCOUNTER — Ambulatory Visit (INDEPENDENT_AMBULATORY_CARE_PROVIDER_SITE_OTHER): Payer: BC Managed Care – PPO | Admitting: Internal Medicine

## 2014-11-13 ENCOUNTER — Encounter: Payer: Self-pay | Admitting: Internal Medicine

## 2014-11-13 VITALS — BP 110/70 | HR 55 | Wt 164.0 lb

## 2014-11-13 DIAGNOSIS — M609 Myositis, unspecified: Secondary | ICD-10-CM

## 2014-11-13 DIAGNOSIS — M791 Myalgia: Secondary | ICD-10-CM

## 2014-11-13 DIAGNOSIS — K589 Irritable bowel syndrome without diarrhea: Secondary | ICD-10-CM | POA: Diagnosis not present

## 2014-11-13 DIAGNOSIS — H8113 Benign paroxysmal vertigo, bilateral: Secondary | ICD-10-CM

## 2014-11-13 DIAGNOSIS — E538 Deficiency of other specified B group vitamins: Secondary | ICD-10-CM | POA: Diagnosis not present

## 2014-11-13 DIAGNOSIS — R918 Other nonspecific abnormal finding of lung field: Secondary | ICD-10-CM

## 2014-11-13 DIAGNOSIS — IMO0001 Reserved for inherently not codable concepts without codable children: Secondary | ICD-10-CM

## 2014-11-13 MED ORDER — CYANOCOBALAMIN 1000 MCG/ML IJ SOLN
1000.0000 ug | Freq: Once | INTRAMUSCULAR | Status: AC
  Administered 2014-11-13: 1000 ug via INTRAMUSCULAR

## 2014-11-13 NOTE — Progress Notes (Signed)
Subjective:  Patient ID: Regina [name], female    DOB: [birth year]  Age: 49 y.o. MRN: [id number]  CC: No chief complaint on file.   HPI   Regina [name] presents for PAT, FMS, B12 def. Pt got married 1 wk ago    Outpatient Prescriptions Prior to Visit  Medication Sig Dispense Refill  . atenolol (TENORMIN) 50 MG tablet Take 1 tablet (50 mg total) by mouth 2 (two) times daily as needed. 30 tablet 3  . Cholecalciferol (GNP VITAMIN D) 1000 UNITS tablet Take 1 tablet (1,000 Units total) by mouth daily. 100 tablet 3  . cyanocobalamin (,VITAMIN B-12,) 1000 MCG/ML injection Inject 1 mL (1,000 mcg total) into the skin every 14 (fourteen) days. 10 mL 11  . Probiotic Product (ALIGN) 4 MG CAPS Take 1 capsule by mouth daily. 14 capsule 0  . psyllium (REGULOID) 0.52 G capsule Take 0.52 g by mouth daily.    . SYRINGE-NEEDLE, DISP, 3 ML (BD ECLIPSE SYRINGE) 25G X 1" 3 ML MISC Use IM 50 each 11   No facility-administered medications prior to visit.    ROS  Review of Systems  Constitutional: Negative for chills, activity change, appetite change, fatigue and unexpected weight change.  HENT: Negative for congestion, mouth sores and sinus pressure.   Eyes: Negative for visual disturbance.  Respiratory: Negative for cough and chest tightness.   Gastrointestinal: Negative for nausea, abdominal pain and diarrhea.  Genitourinary: Negative for frequency, difficulty urinating and vaginal pain.  Musculoskeletal: Positive for arthralgias. Negative for back pain and gait problem.  Skin: Negative for pallor and rash.  Neurological: Negative for dizziness, tremors, weakness, numbness and headaches.  Psychiatric/Behavioral: Negative for suicidal ideas, confusion and sleep disturbance. The patient is nervous/anxious.     Objective:  BP 110/70 mmHg  Pulse 55  Wt 164 lb (74.39 kg)  SpO2 97%  BP Readings from Last 3 Encounters:  11/13/14 110/70  09/07/14 90/54  07/31/14 118/70    Wt Readings from  Last 3 Encounters:  11/13/14 164 lb (74.39 kg)  09/07/14 164 lb 6.4 oz (74.571 kg)  07/31/14 159 lb (72.122 kg)    Physical Exam  Constitutional: She appears well-developed. No distress.  HENT:  Head: Normocephalic.  Right Ear: External ear normal.  Left Ear: External ear normal.  Nose: Nose normal.  Mouth/Throat: Oropharynx is clear and moist.  Eyes: Conjunctivae are normal. Pupils are equal, round, and reactive to light. Right eye exhibits no discharge. Left eye exhibits no discharge.  Neck: Normal range of motion. Neck supple. No JVD present. No tracheal deviation present. No thyromegaly present.  Cardiovascular: Normal rate, regular rhythm and normal heart sounds.   Pulmonary/Chest: No stridor. No respiratory distress. She has no wheezes.  Abdominal: Soft. Bowel sounds are normal. She exhibits no distension and no mass. There is no tenderness. There is no rebound and no guarding.  Musculoskeletal: She exhibits no edema or tenderness.  Lymphadenopathy:    She has no cervical adenopathy.  Neurological: She displays normal reflexes. No cranial nerve deficit. She exhibits normal muscle tone. Coordination normal.  Skin: No rash noted. No erythema.  Psychiatric: She has a normal mood and affect. Her behavior is normal. Judgment and thought content normal.    Lab Results  Component Value Date   WBC 7.4 07/30/2014   HGB 12.6 07/30/2014   HCT 37.0 07/30/2014   PLT 339.0 07/30/2014   GLUCOSE 91 07/30/2014   CHOL 174 03/18/2010   TRIG 48.0 03/18/2010  HDL 54.50 03/18/2010   LDLCALC 110* 03/18/2010   ALT 12 01/26/2013   AST 13 01/26/2013   NA 138 07/30/2014   K 3.7 07/30/2014   CL 104 07/30/2014   CREATININE 0.70 07/30/2014   BUN 13 07/30/2014   CO2 28 07/30/2014   TSH 1.03 07/30/2014    Ct Maxillofacial Ltd Wo Cm  05/23/2013   CLINICAL DATA:  49 year old female with left side ear ache, dizziness, chronic cough and congestion. Two rounds of antibiotics with persistent  symptoms. Initial encounter.  EXAM: CT PARANASAL SINUS WITHOUT CONTRAST  TECHNIQUE: Multidetector CT images of the paranasal sinuses were obtained using the standard protocol without intravenous contrast.  COMPARISON:  None.  FINDINGS: Grossly negative visualized non contrast brain parenchyma, orbits soft tissues, face soft tissues.  Hyperplastic sphenoid sinuses are clear allowing for mild partial volume artifact.  The frontal sinuses are also somewhat hyperplastic and clear.  Ethmoid air cells are clear.  Hypoplastic frontal sinuses are clear.  No acute osseous abnormality identified.  IMPRESSION: Negative paranasal sinuses.   Electronically Signed   By: Lars Pinks M.D.   On: 05/23/2013 11:27    Assessment & Plan:   There are no diagnoses linked to this encounter. I am having Regina [name] maintain her ALIGN, psyllium, Cholecalciferol, atenolol, SYRINGE-NEEDLE (DISP) 3 ML, and cyanocobalamin.  No orders of the defined types were placed in this encounter.     Follow-up: No Follow-up on file.  Walker Kehr, MD

## 2014-11-13 NOTE — Assessment & Plan Note (Signed)
CT

## 2014-11-13 NOTE — Assessment & Plan Note (Signed)
Resolved

## 2014-11-13 NOTE — Progress Notes (Signed)
Pre visit review using our clinic review tool, if applicable. No additional management support is needed unless otherwise documented below in the visit note. 

## 2014-11-13 NOTE — Assessment & Plan Note (Signed)
Chronic  FMS Gluten free and milk free diet helped

## 2014-11-13 NOTE — Assessment & Plan Note (Signed)
Chronic  FMS Gluten free and milk free diet helped before

## 2014-11-13 NOTE — Assessment & Plan Note (Signed)
On B12 

## 2014-11-22 ENCOUNTER — Ambulatory Visit (INDEPENDENT_AMBULATORY_CARE_PROVIDER_SITE_OTHER)
Admission: RE | Admit: 2014-11-22 | Discharge: 2014-11-22 | Disposition: A | Discharge status: Home / Self Care | Payer: BC Managed Care – PPO | Source: Ambulatory Visit | Attending: Internal Medicine | Admitting: Internal Medicine

## 2014-11-22 DIAGNOSIS — R918 Other nonspecific abnormal finding of lung field: Secondary | ICD-10-CM

## 2015-02-11 ENCOUNTER — Telehealth: Payer: Self-pay | Admitting: Internal Medicine

## 2015-02-11 MED ORDER — AMOXICILLIN 875 MG PO TABS: 875.0000 mg | ORAL_TABLET | Freq: Two times a day (BID) | ORAL | Status: DC

## 2015-02-11 NOTE — Telephone Encounter (Signed)
OK Amoxicillin - done Thx

## 2015-02-11 NOTE — Telephone Encounter (Signed)
Patient has a toothache for a week and she went to the dentist and they are wanting to perform a root canal tomorrow and she states the root is all the way up into her nasal cavity.  She is needing some antibiotic to do the precedure but I can't get her an appointment with anyone today. Are you able to call something in for her.  [facility] [place]

## 2015-02-11 NOTE — Telephone Encounter (Signed)
Pt informed

## 2015-02-26 ENCOUNTER — Ambulatory Visit (INDEPENDENT_AMBULATORY_CARE_PROVIDER_SITE_OTHER): Payer: BC Managed Care – PPO | Admitting: Internal Medicine

## 2015-02-26 ENCOUNTER — Encounter: Payer: Self-pay | Admitting: Internal Medicine

## 2015-02-26 ENCOUNTER — Other Ambulatory Visit (INDEPENDENT_AMBULATORY_CARE_PROVIDER_SITE_OTHER): Payer: BC Managed Care – PPO

## 2015-02-26 VITALS — BP 114/68 | HR 75 | Wt 168.0 lb

## 2015-02-26 DIAGNOSIS — I471 Supraventricular tachycardia: Secondary | ICD-10-CM

## 2015-02-26 DIAGNOSIS — E538 Deficiency of other specified B group vitamins: Secondary | ICD-10-CM

## 2015-02-26 DIAGNOSIS — R5382 Chronic fatigue, unspecified: Secondary | ICD-10-CM

## 2015-02-26 DIAGNOSIS — R21 Rash and other nonspecific skin eruption: Secondary | ICD-10-CM

## 2015-02-26 DIAGNOSIS — I4719 Other supraventricular tachycardia: Secondary | ICD-10-CM

## 2015-02-26 LAB — HEPATIC FUNCTION PANEL
ALBUMIN: 4.2 g/dL (ref 3.5–5.2)
ALK PHOS: 47 U/L (ref 39–117)
ALT: 13 U/L (ref 0–35)
AST: 15 U/L (ref 0–37)
Bilirubin, Direct: 0.1 mg/dL (ref 0.0–0.3)
TOTAL PROTEIN: 7.1 g/dL (ref 6.0–8.3)
Total Bilirubin: 0.5 mg/dL (ref 0.2–1.2)

## 2015-02-26 LAB — BASIC METABOLIC PANEL
BUN: 12 mg/dL (ref 6–23)
CALCIUM: 9.3 mg/dL (ref 8.4–10.5)
CHLORIDE: 103 meq/L (ref 96–112)
CO2: 28 meq/L (ref 19–32)
Creatinine, Ser: 0.67 mg/dL (ref 0.40–1.20)
GFR: 99.08 mL/min (ref >60.00)
GLUCOSE: 88 mg/dL (ref 70–99)
POTASSIUM: 3.9 meq/L (ref 3.5–5.1)
SODIUM: 140 meq/L (ref 135–145)

## 2015-02-26 LAB — CBC WITH DIFFERENTIAL/PLATELET
BASOS ABS: 0 10*3/uL (ref 0.0–0.1)
Basophils Relative: 0.1 % (ref 0.0–3.0)
EOS PCT: 0.1 % (ref 0.0–5.0)
Eosinophils Absolute: 0 10*3/uL (ref 0.0–0.7)
HEMATOCRIT: 39.2 % (ref 36.0–46.0)
Hemoglobin: 13 g/dL (ref 12.0–15.0)
LYMPHS ABS: 2.3 10*3/uL (ref 0.7–4.0)
LYMPHS PCT: 34.9 % (ref 12.0–46.0)
MCHC: 33.1 g/dL (ref 30.0–36.0)
MCV: 89 fl (ref 78.0–100.0)
MONOS PCT: 7.1 % (ref 3.0–12.0)
Monocytes Absolute: 0.5 10*3/uL (ref 0.1–1.0)
NEUTROS ABS: 3.8 10*3/uL (ref 1.4–7.7)
Neutrophils Relative %: 57.8 % (ref 43.0–77.0)
PLATELETS: 377 10*3/uL (ref 150.0–400.0)
RBC: 4.4 Mil/uL (ref 3.87–5.11)
RDW: 12.7 % (ref 11.5–15.5)
WBC: 6.6 10*3/uL (ref 4.0–10.5)

## 2015-02-26 LAB — URINALYSIS
Bilirubin Urine: NEGATIVE
KETONES UR: NEGATIVE
Leukocytes, UA: NEGATIVE
Nitrite: NEGATIVE
TOTAL PROTEIN, URINE-UPE24: NEGATIVE
URINE GLUCOSE: NEGATIVE
Urobilinogen, UA: 0.2 (ref 0.0–1.0)
pH: 5.5 (ref 5.0–8.0)

## 2015-02-26 LAB — LIPID PANEL
CHOL/HDL RATIO: 3
Cholesterol: 201 mg/dL — ABNORMAL HIGH (ref 0–200)
HDL: 62.1 mg/dL (ref >39.00)
LDL CALC: 126 mg/dL — AB (ref 0–99)
NONHDL: 139.08
Triglycerides: 65 mg/dL (ref 0.0–149.0)
VLDL: 13 mg/dL (ref 0.0–40.0)

## 2015-02-26 LAB — T4, FREE: Free T4: 0.76 ng/dL (ref 0.60–1.60)

## 2015-02-26 LAB — FOLLICLE STIMULATING HORMONE: FSH: 40.9 m[IU]/mL

## 2015-02-26 LAB — VITAMIN B12: Vitamin B-12: 613 pg/mL (ref 211–911)

## 2015-02-26 LAB — TSH: TSH: 1.73 u[IU]/mL (ref 0.35–4.50)

## 2015-02-26 MED ORDER — FLUTICASONE PROPIONATE 50 MCG/ACT NA SUSP: 2.0000 | Freq: Every day | NASAL | Status: DC

## 2015-02-26 MED ORDER — CLOBETASOL PROPIONATE 0.05 % EX FOAM: Freq: Two times a day (BID) | CUTANEOUS | Status: DC

## 2015-02-26 MED ORDER — LORATADINE 10 MG PO TABS: 10.0000 mg | ORAL_TABLET | Freq: Every day | ORAL | Status: DC

## 2015-02-26 MED ORDER — CYANOCOBALAMIN 1000 MCG/ML IJ SOLN
1000.0000 ug | Freq: Once | INTRAMUSCULAR | Status: AC
  Administered 2015-02-26: 1000 ug via INTRAMUSCULAR

## 2015-02-26 NOTE — Assessment & Plan Note (Signed)
Atenolol prn 

## 2015-02-26 NOTE — Assessment & Plan Note (Signed)
On B12 

## 2015-02-26 NOTE — Progress Notes (Signed)
Pre visit review using our clinic review tool, if applicable. No additional management support is needed unless otherwise documented below in the visit note. 

## 2015-02-26 NOTE — Assessment & Plan Note (Signed)
Chronic  [facility]

## 2015-02-26 NOTE — Progress Notes (Signed)
Subjective:  Patient ID: Regina [name], female    DOB: 1965-09-17  Age: 49 y.o. MRN: [id number]  CC: No chief complaint on file.   HPI Regina [name] presents for a peeling scalp x weeks. C/o wt gain: 4 lbs around the waist. C/o sinus sx's, mucus in the throat has mucus in it. Some GERD  Outpatient Prescriptions Prior to Visit  Medication Sig Dispense Refill  . atenolol (TENORMIN) 50 MG tablet Take 1 tablet (50 mg total) by mouth 2 (two) times daily as needed. 30 tablet 3  . Cholecalciferol (GNP VITAMIN D) 1000 UNITS tablet Take 1 tablet (1,000 Units total) by mouth daily. 100 tablet 3  . cyanocobalamin (,VITAMIN B-12,) 1000 MCG/ML injection Inject 1 mL (1,000 mcg total) into the skin every 14 (fourteen) days. 10 mL 11  . Probiotic Product (ALIGN) 4 MG CAPS Take 1 capsule by mouth daily. 14 capsule 0  . psyllium (REGULOID) 0.52 G capsule Take 0.52 g by mouth daily.    . SYRINGE-NEEDLE, DISP, 3 ML (BD ECLIPSE SYRINGE) 25G X 1" 3 ML MISC Use IM 50 each 11  . amoxicillin (AMOXIL) 875 MG tablet Take 1 tablet (875 mg total) by mouth 2 (two) times daily. 20 tablet 0   No facility-administered medications prior to visit.    ROS Review of Systems  Constitutional: Positive for fatigue and unexpected weight change. Negative for chills, activity change and appetite change.  HENT: Positive for rhinorrhea. Negative for congestion, mouth sores and sinus pressure.   Eyes: Negative for visual disturbance.  Respiratory: Negative for cough and chest tightness.   Gastrointestinal: Negative for nausea and abdominal pain.  Genitourinary: Negative for frequency, difficulty urinating and vaginal pain.  Musculoskeletal: Negative for back pain and gait problem.  Skin: Negative for pallor and rash.  Neurological: Negative for dizziness, tremors, weakness, numbness and headaches.  Psychiatric/Behavioral: Negative for confusion, sleep disturbance and dysphoric mood. The patient is nervous/anxious.      Objective:  BP 114/68 mmHg  Pulse 75  Wt 168 lb (76.204 kg)  SpO2 97%  BP Readings from Last 3 Encounters:  02/26/15 114/68  11/13/14 110/70  09/07/14 90/54    Wt Readings from Last 3 Encounters:  02/26/15 168 lb (76.204 kg)  11/13/14 164 lb (74.39 kg)  09/07/14 164 lb 6.4 oz (74.571 kg)    Physical Exam  Constitutional: She appears well-developed. No distress.  HENT:  Head: Normocephalic.  Right Ear: External ear normal.  Left Ear: External ear normal.  Mouth/Throat: Oropharynx is clear and moist.  Eyes: Conjunctivae are normal. Pupils are equal, round, and reactive to light. Right eye exhibits no discharge. Left eye exhibits no discharge.  Neck: Normal range of motion. Neck supple. No JVD present. No tracheal deviation present. No thyromegaly present.  Cardiovascular: Normal rate, regular rhythm and normal heart sounds.   Pulmonary/Chest: No stridor. No respiratory distress. She has no wheezes.  Abdominal: Soft. Bowel sounds are normal. She exhibits no distension and no mass. There is no tenderness. There is no rebound and no guarding.  Musculoskeletal: She exhibits no edema or tenderness.  Lymphadenopathy:    She has no cervical adenopathy.  Neurological: She displays normal reflexes. No cranial nerve deficit. She exhibits normal muscle tone. Coordination normal.  Skin: No rash noted. No erythema.  Psychiatric: She has a normal mood and affect. Her behavior is normal. Judgment and thought content normal.    Lab Results  Component Value Date   WBC 6.6 02/26/2015  HGB 13.0 02/26/2015   HCT 39.2 02/26/2015   PLT 377.0 02/26/2015   GLUCOSE 88 02/26/2015   CHOL 201* 02/26/2015   TRIG 65.0 02/26/2015   HDL 62.10 02/26/2015   LDLCALC 126* 02/26/2015   ALT 13 02/26/2015   AST 15 02/26/2015   NA 140 02/26/2015   K 3.9 02/26/2015   CL 103 02/26/2015   CREATININE 0.67 02/26/2015   BUN 12 02/26/2015   CO2 28 02/26/2015   TSH 1.73 02/26/2015    Ct Chest Wo  Contrast  11/22/2014  CLINICAL DATA:  Followup multiple pulmonary nodules. EXAM: CT CHEST WITHOUT CONTRAST TECHNIQUE: Multidetector CT imaging of the chest was performed following the standard protocol without IV contrast. COMPARISON:  Chest CT, 09/07/2012. FINDINGS: Thoracic inlet:  No mass or adenopathy.  Normal visualized thyroid. Mediastinum and hila: Heart normal in size and configuration. No mediastinal or hilar masses or pathologically enlarged lymph nodes. Great vessels normal in caliber. Lungs and pleura: 5 mm x 3 mm focal opacity along the minor fissure, image 31, series 3, without change. 7 mm oval nodule along the minor fissure, image 32, series 3, also stable. Small oval nodule measuring 4 mm along the left oblique fissure, image 27, stable. There are few tiny faint opacities peripherally that are unchanged. No other discrete nodules. No new nodules. No lung consolidation or edema. No pleural effusion or pneumothorax. Limited upper abdomen:  Unremarkable. Musculoskeletal: Minor disc degenerative change along the mid thoracic spine. No osteoblastic or osteolytic lesions. IMPRESSION: 1. Small pulmonary nodules as described, stable from the prior CT. These have been stable for over 2 years. No additional followup recommended. 2. No new pulmonary nodules.  No acute findings. Electronically Signed   By: Lajean Manes M.D.   On: 11/22/2014 12:07    Assessment & Plan:   Diagnoses and all orders for this visit:  Chronic fatigue -     Basic metabolic panel; Future -     CBC with Differential/Platelet; Future -     Hepatic function panel; Future -     TSH; Future -     Vitamin B12; Future -     Urinalysis; Future -     FSH; Future -     T4, free; Future  B12 deficiency -     Basic metabolic panel; Future -     CBC with Differential/Platelet; Future -     Hepatic function panel; Future -     TSH; Future -     Vitamin B12; Future -     Urinalysis; Future -     cyanocobalamin ((VITAMIN  B-12)) injection 1,000 mcg; Inject 1 mL (1,000 mcg total) into the muscle once.  PAT (paroxysmal atrial tachycardia) (HCC) -     Basic metabolic panel; Future -     CBC with Differential/Platelet; Future -     Hepatic function panel; Future -     TSH; Future -     Vitamin B12; Future -     Urinalysis; Future -     Lipid panel; Future  Scaly patch rash -     Basic metabolic panel; Future -     CBC with Differential/Platelet; Future -     Hepatic function panel; Future -     TSH; Future -     Vitamin B12; Future -     Urinalysis; Future  Other orders -     clobetasol (OLUX) 0.05 % topical foam; Apply topically 2 (two) times daily. -  fluticasone (FLONASE) 50 MCG/ACT nasal spray; Place 2 sprays into both nostrils daily. -     loratadine (CLARITIN) 10 MG tablet; Take 1 tablet (10 mg total) by mouth daily.   I have discontinued Ms. [name]'s amoxicillin. I am also having her start on clobetasol, fluticasone, and loratadine. Additionally, I am having her maintain her ALIGN, psyllium, Cholecalciferol, atenolol, SYRINGE-NEEDLE (DISP) 3 ML, and cyanocobalamin. We administered cyanocobalamin.  Meds ordered this encounter  Medications  . clobetasol (OLUX) 0.05 % topical foam    Sig: Apply topically 2 (two) times daily.    Dispense:  50 g    Refill:  1  . fluticasone (FLONASE) 50 MCG/ACT nasal spray    Sig: Place 2 sprays into both nostrils daily.    Dispense:  16 g    Refill:  6  . loratadine (CLARITIN) 10 MG tablet    Sig: Take 1 tablet (10 mg total) by mouth daily.    Dispense:  100 tablet    Refill:  3  . cyanocobalamin ((VITAMIN B-12)) injection 1,000 mcg    Sig:      Follow-up: Return in about 3 months (around 05/28/2015) for a follow-up visit.  Walker Kehr, MD

## 2015-03-18 ENCOUNTER — Ambulatory Visit: Payer: BC Managed Care – PPO | Admitting: Internal Medicine

## 2015-05-07 ENCOUNTER — Ambulatory Visit: Payer: BC Managed Care – PPO | Admitting: Family Medicine

## 2015-05-07 ENCOUNTER — Ambulatory Visit (INDEPENDENT_AMBULATORY_CARE_PROVIDER_SITE_OTHER): Payer: BC Managed Care – PPO | Admitting: Internal Medicine

## 2015-05-07 ENCOUNTER — Encounter: Payer: Self-pay | Admitting: Internal Medicine

## 2015-05-07 ENCOUNTER — Other Ambulatory Visit (INDEPENDENT_AMBULATORY_CARE_PROVIDER_SITE_OTHER): Payer: BC Managed Care – PPO

## 2015-05-07 VITALS — BP 102/80 | HR 85 | Temp 99.5°F | Wt 165.0 lb

## 2015-05-07 DIAGNOSIS — R1013 Epigastric pain: Secondary | ICD-10-CM | POA: Diagnosis not present

## 2015-05-07 DIAGNOSIS — E538 Deficiency of other specified B group vitamins: Secondary | ICD-10-CM

## 2015-05-07 LAB — CBC WITH DIFFERENTIAL/PLATELET
Basophils Absolute: 0 10*3/uL (ref 0.0–0.1)
Basophils Relative: 0.4 % (ref 0.0–3.0)
EOS PCT: 0.1 % (ref 0.0–5.0)
Eosinophils Absolute: 0 10*3/uL (ref 0.0–0.7)
HEMATOCRIT: 40.5 % (ref 36.0–46.0)
Hemoglobin: 13.4 g/dL (ref 12.0–15.0)
LYMPHS ABS: 1.4 10*3/uL (ref 0.7–4.0)
Lymphocytes Relative: 25.5 % (ref 12.0–46.0)
MCHC: 33.1 g/dL (ref 30.0–36.0)
MCV: 86.7 fl (ref 78.0–100.0)
MONOS PCT: 11.1 % (ref 3.0–12.0)
Monocytes Absolute: 0.6 10*3/uL (ref 0.1–1.0)
NEUTROS PCT: 62.9 % (ref 43.0–77.0)
Neutro Abs: 3.5 10*3/uL (ref 1.4–7.7)
Platelets: 310 10*3/uL (ref 150.0–400.0)
RBC: 4.67 Mil/uL (ref 3.87–5.11)
RDW: 13.1 % (ref 11.5–15.5)
WBC: 5.5 10*3/uL (ref 4.0–10.5)

## 2015-05-07 LAB — HEPATIC FUNCTION PANEL
ALK PHOS: 53 U/L (ref 39–117)
ALT: 17 U/L (ref 0–35)
AST: 18 U/L (ref 0–37)
Albumin: 4.2 g/dL (ref 3.5–5.2)
BILIRUBIN DIRECT: 0.1 mg/dL (ref 0.0–0.3)
TOTAL PROTEIN: 7.3 g/dL (ref 6.0–8.3)
Total Bilirubin: 0.4 mg/dL (ref 0.2–1.2)

## 2015-05-07 LAB — BASIC METABOLIC PANEL
BUN: 7 mg/dL (ref 6–23)
CHLORIDE: 102 meq/L (ref 96–112)
CO2: 31 mEq/L (ref 19–32)
CREATININE: 0.63 mg/dL (ref 0.40–1.20)
Calcium: 9.4 mg/dL (ref 8.4–10.5)
GFR: 106.3 mL/min (ref >60.00)
GLUCOSE: 89 mg/dL (ref 70–99)
POTASSIUM: 3.8 meq/L (ref 3.5–5.1)
Sodium: 139 mEq/L (ref 135–145)

## 2015-05-07 LAB — LIPASE: Lipase: 9 U/L — ABNORMAL LOW (ref 11.0–59.0)

## 2015-05-07 LAB — SEDIMENTATION RATE: Sed Rate: 22 mm/hr (ref 0–22)

## 2015-05-07 MED ORDER — ESOMEPRAZOLE MAGNESIUM 40 MG PO CPDR: 40.0000 mg | DELAYED_RELEASE_CAPSULE | Freq: Every day | ORAL | Status: DC

## 2015-05-07 MED ORDER — CYANOCOBALAMIN 1000 MCG/ML IJ SOLN
1000.0000 ug | Freq: Once | INTRAMUSCULAR | Status: AC
  Administered 2015-05-07: 1000 ug via INTRAMUSCULAR

## 2015-05-07 MED ORDER — ONDANSETRON HCL 4 MG PO TABS: 4.0000 mg | ORAL_TABLET | Freq: Three times a day (TID) | ORAL | Status: DC | PRN

## 2015-05-07 NOTE — Progress Notes (Signed)
Pre visit review using our clinic review tool, if applicable. No additional management support is needed unless otherwise documented below in the visit note. 

## 2015-05-07 NOTE — Assessment & Plan Note (Signed)
2/17 acute on chronic ?etiology Labs Nexium Zofran abd [place]

## 2015-05-07 NOTE — Progress Notes (Signed)
Subjective:  Patient ID: Regina [name], female    DOB: 05/31/1965  Age: 50 y.o. MRN: [id number]  CC: No chief complaint on file.   HPI Regina [name] presents for sinus congestion, chills, nausea x 3-4 d. C/o dyspepsia x 2d, feels like a knot  F/u [facility] test  Outpatient Prescriptions Prior to Visit  Medication Sig Dispense Refill  . atenolol (TENORMIN) 50 MG tablet Take 1 tablet (50 mg total) by mouth 2 (two) times daily as needed. 30 tablet 3  . Cholecalciferol (GNP VITAMIN D) 1000 UNITS tablet Take 1 tablet (1,000 Units total) by mouth daily. 100 tablet 3  . clobetasol (OLUX) 0.05 % topical foam Apply topically 2 (two) times daily. 50 g 1  . cyanocobalamin (,VITAMIN B-12,) 1000 MCG/ML injection Inject 1 mL (1,000 mcg total) into the skin every 14 (fourteen) days. 10 mL 11  . fluticasone (FLONASE) 50 MCG/ACT nasal spray Place 2 sprays into both nostrils daily. 16 g 6  . loratadine (CLARITIN) 10 MG tablet Take 1 tablet (10 mg total) by mouth daily. 100 tablet 3  . Probiotic Product (ALIGN) 4 MG CAPS Take 1 capsule by mouth daily. 14 capsule 0  . psyllium (REGULOID) 0.52 G capsule Take 0.52 g by mouth daily.    . SYRINGE-NEEDLE, DISP, 3 ML (BD ECLIPSE SYRINGE) 25G X 1" 3 ML MISC Use IM 50 each 11   No facility-administered medications prior to visit.    ROS Review of Systems  Constitutional: Negative for chills, activity change, appetite change, fatigue and unexpected weight change.  HENT: Positive for postnasal drip and sinus pressure. Negative for congestion and mouth sores.   Eyes: Negative for visual disturbance.  Respiratory: Negative for cough and chest tightness.   Gastrointestinal: Positive for abdominal pain. Negative for nausea and constipation.  Genitourinary: Negative for frequency, difficulty urinating and vaginal pain.  Musculoskeletal: Negative for back pain and gait problem.  Skin: Negative for pallor and rash.  Neurological: Negative for dizziness, tremors,  weakness, numbness and headaches.  Psychiatric/Behavioral: Negative for suicidal ideas, confusion and sleep disturbance.    Objective:  BP 102/80 mmHg  Pulse 85  Temp(Src) 99.5 F (37.5 C) (Oral)  Wt 165 lb (74.844 kg)  SpO2 97%  BP Readings from Last 3 Encounters:  05/07/15 102/80  02/26/15 114/68  11/13/14 110/70    Wt Readings from Last 3 Encounters:  05/07/15 165 lb (74.844 kg)  02/26/15 168 lb (76.204 kg)  11/13/14 164 lb (74.39 kg)    Physical Exam  Constitutional: She appears well-developed. No distress.  HENT:  Head: Normocephalic.  Right Ear: External ear normal.  Left Ear: External ear normal.  Nose: Nose normal.  Mouth/Throat: Oropharynx is clear and moist.  Eyes: Conjunctivae are normal. Pupils are equal, round, and reactive to light. Right eye exhibits no discharge. Left eye exhibits no discharge.  Neck: Normal range of motion. Neck supple. No JVD present. No tracheal deviation present. No thyromegaly present.  Cardiovascular: Normal rate, regular rhythm and normal heart sounds.   Pulmonary/Chest: No stridor. No respiratory distress. She has no wheezes.  Abdominal: Soft. Bowel sounds are normal. She exhibits no distension and no mass. There is no tenderness. There is no rebound and no guarding.  Musculoskeletal: She exhibits no edema or tenderness.  Lymphadenopathy:    She has no cervical adenopathy.  Neurological: She displays normal reflexes. No cranial nerve deficit. She exhibits normal muscle tone. Coordination normal.  Skin: No rash noted. No erythema.  Psychiatric:  She has a normal mood and affect. Her behavior is normal. Judgment and thought content normal.    Lab Results  Component Value Date   WBC 5.5 05/07/2015   HGB 13.4 05/07/2015   HCT 40.5 05/07/2015   PLT 310.0 05/07/2015   GLUCOSE 89 05/07/2015   CHOL 201* 02/26/2015   TRIG 65.0 02/26/2015   HDL 62.10 02/26/2015   LDLCALC 126* 02/26/2015   ALT 17 05/07/2015   AST 18 05/07/2015    NA 139 05/07/2015   K 3.8 05/07/2015   CL 102 05/07/2015   CREATININE 0.63 05/07/2015   BUN 7 05/07/2015   CO2 31 05/07/2015   TSH 1.73 02/26/2015    Ct Chest Wo Contrast  11/22/2014  CLINICAL DATA:  Followup multiple pulmonary nodules. EXAM: CT CHEST WITHOUT CONTRAST TECHNIQUE: Multidetector CT imaging of the chest was performed following the standard protocol without IV contrast. COMPARISON:  Chest CT, 09/07/2012. FINDINGS: Thoracic inlet:  No mass or adenopathy.  Normal visualized thyroid. Mediastinum and hila: Heart normal in size and configuration. No mediastinal or hilar masses or pathologically enlarged lymph nodes. Great vessels normal in caliber. Lungs and pleura: 5 mm x 3 mm focal opacity along the minor fissure, image 31, series 3, without change. 7 mm oval nodule along the minor fissure, image 32, series 3, also stable. Small oval nodule measuring 4 mm along the left oblique fissure, image 27, stable. There are few tiny faint opacities peripherally that are unchanged. No other discrete nodules. No new nodules. No lung consolidation or edema. No pleural effusion or pneumothorax. Limited upper abdomen:  Unremarkable. Musculoskeletal: Minor disc degenerative change along the mid thoracic spine. No osteoblastic or osteolytic lesions. IMPRESSION: 1. Small pulmonary nodules as described, stable from the prior CT. These have been stable for over 2 years. No additional followup recommended. 2. No new pulmonary nodules.  No acute findings. Electronically Signed   By: Lajean Manes M.D.   On: 11/22/2014 12:07    Assessment & Plan:   Diagnoses and all orders for this visit:  Abdominal pain, epigastric -     Basic metabolic panel; Future -     CBC with Differential/Platelet; Future -     Sedimentation rate; Future -     Lipase; Future -     Hepatic function panel; Future -     US Abdomen Complete  B12 deficiency -     cyanocobalamin ((VITAMIN B-12)) injection 1,000 mcg; Inject 1 mL (1,000  mcg total) into the muscle once.  Other orders -     ondansetron (ZOFRAN) 4 MG tablet; Take 1 tablet (4 mg total) by mouth every 8 (eight) hours as needed for nausea or vomiting. -     esomeprazole (NEXIUM) 40 MG capsule; Take 1 capsule (40 mg total) by mouth daily.  I am having Ms. [name] start on ondansetron and esomeprazole. I am also having her maintain her ALIGN, psyllium, Cholecalciferol, atenolol, SYRINGE-NEEDLE (DISP) 3 ML, cyanocobalamin, clobetasol, fluticasone, loratadine, and GuaiFENesin (MUCINEX PO). We administered cyanocobalamin.  Meds ordered this encounter  Medications  . GuaiFENesin (MUCINEX PO)    Sig: Take by mouth as directed.  . ondansetron (ZOFRAN) 4 MG tablet    Sig: Take 1 tablet (4 mg total) by mouth every 8 (eight) hours as needed for nausea or vomiting.    Dispense:  20 tablet    Refill:  0  . esomeprazole (NEXIUM) 40 MG capsule    Sig: Take 1 capsule (40 mg total) by mouth  daily.    Dispense:  30 capsule    Refill:  1  . cyanocobalamin ((VITAMIN B-12)) injection 1,000 mcg    Sig:      Follow-up: Return in about 2 weeks (around 05/21/2015) for a follow-up visit.  Walker Kehr, MD

## 2015-05-08 ENCOUNTER — Ambulatory Visit: Payer: BC Managed Care – PPO | Admitting: Internal Medicine

## 2015-05-13 ENCOUNTER — Ambulatory Visit
Admission: RE | Admit: 2015-05-13 | Discharge: 2015-05-13 | Disposition: A | Discharge status: Home / Self Care | Payer: BC Managed Care – PPO | Source: Ambulatory Visit | Attending: Internal Medicine | Admitting: Internal Medicine

## 2015-05-21 ENCOUNTER — Encounter: Payer: Self-pay | Admitting: Internal Medicine

## 2015-05-21 ENCOUNTER — Ambulatory Visit (INDEPENDENT_AMBULATORY_CARE_PROVIDER_SITE_OTHER): Payer: BC Managed Care – PPO | Admitting: Internal Medicine

## 2015-05-21 VITALS — BP 96/70 | HR 76 | Wt 168.0 lb

## 2015-05-21 DIAGNOSIS — J320 Chronic maxillary sinusitis: Secondary | ICD-10-CM

## 2015-05-21 DIAGNOSIS — E538 Deficiency of other specified B group vitamins: Secondary | ICD-10-CM

## 2015-05-21 DIAGNOSIS — K589 Irritable bowel syndrome without diarrhea: Secondary | ICD-10-CM

## 2015-05-21 MED ORDER — CEFDINIR 300 MG PO CAPS: 300.0000 mg | ORAL_CAPSULE | Freq: Two times a day (BID) | ORAL | Status: DC

## 2015-05-21 MED ORDER — PROMETHAZINE-CODEINE 6.25-10 MG/5ML PO SYRP: 5.0000 mL | ORAL_SOLUTION | ORAL | Status: DC | PRN

## 2015-05-21 MED ORDER — CYANOCOBALAMIN 1000 MCG/ML IJ SOLN
1000.0000 ug | Freq: Once | INTRAMUSCULAR | Status: AC
  Administered 2015-05-21: 1000 ug via INTRAMUSCULAR

## 2015-05-21 NOTE — Assessment & Plan Note (Signed)
Dr Erik Obey - ENT ref

## 2015-05-21 NOTE — Progress Notes (Signed)
Subjective:  Patient ID: Regina [name], female    DOB: January 12, 1966  Age: 50 y.o. MRN: [id number]  CC: No chief complaint on file.   HPI Regina [name] presents for abd pain - better C/o sinusitis sx's - green d/c x 1-2 weeks. C/o cough x 1 -2 wks  Outpatient Prescriptions Prior to Visit  Medication Sig Dispense Refill  . atenolol (TENORMIN) 50 MG tablet Take 1 tablet (50 mg total) by mouth 2 (two) times daily as needed. 30 tablet 3  . Cholecalciferol (GNP VITAMIN D) 1000 UNITS tablet Take 1 tablet (1,000 Units total) by mouth daily. 100 tablet 3  . clobetasol (OLUX) 0.05 % topical foam Apply topically 2 (two) times daily. 50 g 1  . cyanocobalamin (,VITAMIN B-12,) 1000 MCG/ML injection Inject 1 mL (1,000 mcg total) into the skin every 14 (fourteen) days. 10 mL 11  . esomeprazole (NEXIUM) 40 MG capsule Take 1 capsule (40 mg total) by mouth daily. 30 capsule 1  . fluticasone (FLONASE) 50 MCG/ACT nasal spray Place 2 sprays into both nostrils daily. 16 g 6  . GuaiFENesin (MUCINEX PO) Take by mouth as directed.    . loratadine (CLARITIN) 10 MG tablet Take 1 tablet (10 mg total) by mouth daily. 100 tablet 3  . ondansetron (ZOFRAN) 4 MG tablet Take 1 tablet (4 mg total) by mouth every 8 (eight) hours as needed for nausea or vomiting. 20 tablet 0  . Probiotic Product (ALIGN) 4 MG CAPS Take 1 capsule by mouth daily. 14 capsule 0  . psyllium (REGULOID) 0.52 G capsule Take 0.52 g by mouth daily.    . SYRINGE-NEEDLE, DISP, 3 ML (BD ECLIPSE SYRINGE) 25G X 1" 3 ML MISC Use IM 50 each 11   No facility-administered medications prior to visit.    ROS Review of Systems  Constitutional: Negative for chills, activity change, appetite change, fatigue and unexpected weight change.  HENT: Positive for congestion, rhinorrhea and sinus pressure. Negative for mouth sores.   Eyes: Negative for visual disturbance.  Respiratory: Positive for cough. Negative for chest tightness and shortness of breath.     Gastrointestinal: Positive for abdominal pain. Negative for nausea and diarrhea.  Genitourinary: Negative for frequency, difficulty urinating and vaginal pain.  Musculoskeletal: Negative for back pain and gait problem.  Skin: Negative for pallor and rash.  Neurological: Negative for dizziness, tremors, weakness, numbness and headaches.  Psychiatric/Behavioral: Negative for confusion and sleep disturbance.    Objective:  BP 96/70 mmHg  Pulse 76  Wt 168 lb (76.204 kg)  SpO2 95%  BP Readings from Last 3 Encounters:  05/21/15 96/70  05/07/15 102/80  02/26/15 114/68    Wt Readings from Last 3 Encounters:  05/21/15 168 lb (76.204 kg)  05/07/15 165 lb (74.844 kg)  02/26/15 168 lb (76.204 kg)    Physical Exam  Constitutional: She appears well-developed. No distress.  HENT:  Head: Normocephalic.  Right Ear: External ear normal.  Left Ear: External ear normal.  Nose: Nose normal.  Mouth/Throat: Oropharynx is clear and moist.  Eyes: Conjunctivae are normal. Pupils are equal, round, and reactive to light. Right eye exhibits no discharge. Left eye exhibits no discharge.  Neck: Normal range of motion. Neck supple. No JVD present. No tracheal deviation present. No thyromegaly present.  Cardiovascular: Normal rate, regular rhythm and normal heart sounds.   Pulmonary/Chest: No stridor. No respiratory distress. She has no wheezes.  Abdominal: Soft. Bowel sounds are normal. She exhibits no distension and no mass. There  is no tenderness. There is no rebound and no guarding.  Musculoskeletal: She exhibits no edema or tenderness.  Lymphadenopathy:    She has no cervical adenopathy.  Neurological: She displays normal reflexes. No cranial nerve deficit. She exhibits normal muscle tone. Coordination normal.  Skin: No rash noted. No erythema.  Psychiatric: She has a normal mood and affect. Her behavior is normal. Judgment and thought content normal.    Lab Results  Component Value Date   WBC  5.5 05/07/2015   HGB 13.4 05/07/2015   HCT 40.5 05/07/2015   PLT 310.0 05/07/2015   GLUCOSE 89 05/07/2015   CHOL 201* 02/26/2015   TRIG 65.0 02/26/2015   HDL 62.10 02/26/2015   LDLCALC 126* 02/26/2015   ALT 17 05/07/2015   AST 18 05/07/2015   NA 139 05/07/2015   K 3.8 05/07/2015   CL 102 05/07/2015   CREATININE 0.63 05/07/2015   BUN 7 05/07/2015   CO2 31 05/07/2015   TSH 1.73 02/26/2015    US Abdomen Complete  05/13/2015  CLINICAL DATA:  Epigastric pain for 1 week. EXAM: ABDOMEN ULTRASOUND COMPLETE COMPARISON:  None. FINDINGS: Gallbladder: No gallstones or wall thickening visualized. No sonographic Murphy sign noted by sonographer. Common bile duct: Diameter: 3.3 mm Liver: No focal lesion identified. Within normal limits in parenchymal echogenicity. IVC: No abnormality visualized. Pancreas: Visualized portion unremarkable. Spleen: Size and appearance within normal limits. Right Kidney: Length: 11.6 cm. Echogenicity within normal limits. No mass or hydronephrosis visualized. Left Kidney: Length: 11 cm. Echogenicity within normal limits. No mass or hydronephrosis visualized. Abdominal aorta: No aneurysm visualized. Other findings: None. IMPRESSION: Normal abdomen ultrasound. Electronically Signed   By: Abelardo Diesel M.D.   On: 05/13/2015 15:26    Assessment & Plan:   Diagnoses and all orders for this visit:  SINUSITIS, MAXILLARY, CHRONIC -     Ambulatory referral to ENT  IBS (irritable bowel syndrome)  B12 deficiency -     cyanocobalamin ((VITAMIN B-12)) injection 1,000 mcg; Inject 1 mL (1,000 mcg total) into the muscle once.  Other orders -     promethazine-codeine (PHENERGAN WITH CODEINE) 6.25-10 MG/5ML syrup; Take 5 mLs by mouth every 4 (four) hours as needed. -     cefdinir (OMNICEF) 300 MG capsule; Take 1 capsule (300 mg total) by mouth 2 (two) times daily.  I am having Ms. [name] start on promethazine-codeine and cefdinir. I am also having her maintain her ALIGN,  psyllium, Cholecalciferol, atenolol, SYRINGE-NEEDLE (DISP) 3 ML, cyanocobalamin, clobetasol, fluticasone, loratadine, GuaiFENesin (MUCINEX PO), ondansetron, and esomeprazole. We administered cyanocobalamin.  Meds ordered this encounter  Medications  . promethazine-codeine (PHENERGAN WITH CODEINE) 6.25-10 MG/5ML syrup    Sig: Take 5 mLs by mouth every 4 (four) hours as needed.    Dispense:  300 mL    Refill:  0  . cefdinir (OMNICEF) 300 MG capsule    Sig: Take 1 capsule (300 mg total) by mouth 2 (two) times daily.    Dispense:  20 capsule    Refill:  0  . cyanocobalamin ((VITAMIN B-12)) injection 1,000 mcg    Sig:      Follow-up: Return in about 4 months (around 09/18/2015) for a follow-up visit.  Walker Kehr, MD

## 2015-05-21 NOTE — Progress Notes (Signed)
Pre visit review using our clinic review tool, if applicable. No additional management support is needed unless otherwise documented below in the visit note. 

## 2015-05-21 NOTE — Assessment & Plan Note (Signed)
Gluten free and milk free diet helped

## 2015-06-17 ENCOUNTER — Encounter: Payer: Self-pay | Admitting: Family Medicine

## 2015-06-17 ENCOUNTER — Telehealth: Payer: Self-pay | Admitting: Internal Medicine

## 2015-06-17 ENCOUNTER — Ambulatory Visit (INDEPENDENT_AMBULATORY_CARE_PROVIDER_SITE_OTHER): Payer: BC Managed Care – PPO | Admitting: Family Medicine

## 2015-06-17 VITALS — BP 110/70 | HR 111 | Temp 98.9°F | Wt 163.0 lb

## 2015-06-17 DIAGNOSIS — R509 Fever, unspecified: Secondary | ICD-10-CM

## 2015-06-17 DIAGNOSIS — E538 Deficiency of other specified B group vitamins: Secondary | ICD-10-CM

## 2015-06-17 DIAGNOSIS — B349 Viral infection, unspecified: Secondary | ICD-10-CM | POA: Diagnosis not present

## 2015-06-17 DIAGNOSIS — R05 Cough: Secondary | ICD-10-CM | POA: Diagnosis not present

## 2015-06-17 DIAGNOSIS — J988 Other specified respiratory disorders: Secondary | ICD-10-CM

## 2015-06-17 DIAGNOSIS — R059 Cough, unspecified: Secondary | ICD-10-CM

## 2015-06-17 DIAGNOSIS — B9789 Other viral agents as the cause of diseases classified elsewhere: Secondary | ICD-10-CM

## 2015-06-17 LAB — POCT INFLUENZA A: Rapid Influenza A Ag: NEGATIVE

## 2015-06-17 MED ORDER — ONDANSETRON 4 MG PO TBDP: 4.0000 mg | ORAL_TABLET | Freq: Three times a day (TID) | ORAL | Status: DC | PRN

## 2015-06-17 MED ORDER — CYANOCOBALAMIN 1000 MCG/ML IJ SOLN
1000.0000 ug | Freq: Once | INTRAMUSCULAR | Status: AC
  Administered 2015-06-17: 1000 ug via INTRAMUSCULAR

## 2015-06-17 MED ORDER — BENZONATATE 100 MG PO CAPS: 100.0000 mg | ORAL_CAPSULE | Freq: Two times a day (BID) | ORAL | Status: DC | PRN

## 2015-06-17 NOTE — Addendum Note (Signed)
Addended by: Clyde Lundborg A on: 06/17/2015 11:41 AM   Modules accepted: Orders

## 2015-06-17 NOTE — Telephone Encounter (Signed)
[facility] [place] [facility]  Patient Name: Regina [name]  DOB: 05-Apr-1965    Initial Comment Caller states she has been sick-- cough- headache- body aches- maybe flu   Nurse Assessment  Nurse: Wisdom, RN, Susie Date/Time (Eastern Time): 06/17/2015 8:19:26 AM  Confirm and document reason for call. If symptomatic, describe symptoms. You must click the next button to save text entered. ---Caller states she has been sick since Wednesday, and has worsened; headache at top of head and temples; has sinus drainage and cough (nonproductive); no fever;  Has the patient traveled out of the country within the last 30 days? ---Not Applicable  Does the patient have any new or worsening symptoms? ---Yes  Will a triage be completed? ---Yes  Related visit to physician within the last 2 weeks? ---N/A  Does the PT have any chronic conditions? (i.e. diabetes, asthma, etc.) ---Yes  List chronic conditions. ---Fibromyalgia, osteoarthritis  Is the patient pregnant or possibly pregnant? (Ask all females between the ages of 27-55) ---No  Is this a behavioral health or substance abuse call? ---No     Guidelines    Guideline Title Affirmed Question Affirmed Notes  Headache [1] MODERATE headache (e.g., interferes with normal activities) AND [2] present > 24 hours AND [3] unexplained (Exceptions: analgesics not tried, typical migraine, or headache part of viral illness)    Final Disposition User   See Physician within 24 Hours Wisdom, RN, Susie    Comments  Caller upset due to inability to make appointment for her today at the office; offered to search other sites for possible appointments, she declined; stated she would call the office; unable to complete outcomes;   Referrals  REFERRED TO PCP OFFICE   Disagree/Comply: Comply

## 2015-06-17 NOTE — Patient Instructions (Signed)
Flu-like illness:  Likely viral illness, possibly flu History and exam today are suggestive of viral process. Patients influenza test was negative.    Symptomatic treatment with: zofran for nausea. Needs to push fluids as dehydrated. Send in tessalon for cough. We offered toradol for headache as well as phenergan but she declined. 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 Also gave b12 injection that usually gets at [facility].   Finally, we reviewed reasons to return to care including if symptoms worsen or persist (past next Monday) or new concerns arise (especially, fever, or shortness of breath). If she has nasal discharge and worsening sinus pain should also return to care as could indicate development of bacterial sinusitis.   Meds ordered this encounter  Medications  . ondansetron (ZOFRAN-ODT) 4 MG disintegrating tablet    Sig: Take 1 tablet (4 mg total) by mouth every 8 (eight) hours as needed for nausea or vomiting.    Dispense:  20 tablet    Refill:  0  . benzonatate (TESSALON) 100 MG capsule    Sig: Take 1 capsule (100 mg total) by mouth 2 (two) times daily as needed for cough.    Dispense:  20 capsule    Refill:  0

## 2015-06-17 NOTE — Telephone Encounter (Signed)
Pt said now she has some phelm and is asking if she need an antibiotic .

## 2015-06-17 NOTE — Telephone Encounter (Signed)
Pt.notified

## 2015-06-17 NOTE — Telephone Encounter (Signed)
Per Dr. Yong Channel pt does not meet criteria for bacterial sinusitis if she has symptoms for 10 days or double sickening she should return to care. If still having sinus pressure and sinus drainage on Friday and not at least %50 improved, Dr Yong Channel would be willing to call in antibiotic.

## 2015-06-17 NOTE — Progress Notes (Signed)
PCP: Walker Kehr, MD  Subjective:  Regina [name] is a 50 y.o. year old very pleasant female patient who presents with flu like symptoms including fever,body aches.  -other symptoms: On Wednesday started with scratchy throat, Thursday started feeling bad with bad cough and taking mucinex D and cough drops. Friday evening fever up to 101 with body aches, also fever yesterday. Started with nausea on Saturday and with severe headache. Has been taking tylenol or ibuprofen (not sure which) every 6 hours with no help. Feels better when laying down- with activity seems to get chilled. Very fatigued. 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 No neck stiffness. No ticks. Mild diarrhea 2 days of illness -inside 48 hour treatment window if needed for tamiflu: no -high risk condition (children <5, adults >65, chronic pulmonary or cardiac condition, immunosuppression, pregnancy, nursing home resident, morbid obesity) : no -symptoms are improving -previous treatments: acetaminophen, ibuprofen - endorses sick contact; specifically influenza: unknown  ROS-denies SOB, vomiting, dental pain, mild maxillary sinus pain but no frontal pain  Pertinent Past Medical History-  Fibromyalgia, history of tachycardia and uses atenolol prn, IBS  Medications- reviewed  Current Outpatient Prescriptions  Medication Sig Dispense Refill  . atenolol (TENORMIN) 50 MG tablet Take 1 tablet (50 mg total) by mouth 2 (two) times daily as needed. 30 tablet 3  . Cholecalciferol (GNP VITAMIN D) 1000 UNITS tablet Take 1 tablet (1,000 Units total) by mouth daily. 100 tablet 3  . clobetasol (OLUX) 0.05 % topical foam Apply topically 2 (two) times daily. 50 g 1  . cyanocobalamin (,VITAMIN B-12,) 1000 MCG/ML injection Inject 1 mL (1,000 mcg total) into the skin every 14 (fourteen) days. 10 mL 11  . esomeprazole (NEXIUM) 40 MG capsule Take 1 capsule (40 mg total) by mouth daily. 30 capsule  1  . fluticasone (FLONASE) 50 MCG/ACT nasal spray Place 2 sprays into both nostrils daily. 16 g 6  . GuaiFENesin (MUCINEX PO) Take by mouth as directed.    . loratadine (CLARITIN) 10 MG tablet Take 1 tablet (10 mg total) by mouth daily. 100 tablet 3  . Probiotic Product (ALIGN) 4 MG CAPS Take 1 capsule by mouth daily. 14 capsule 0  . psyllium (REGULOID) 0.52 G capsule Take 0.52 g by mouth daily.    . SYRINGE-NEEDLE, DISP, 3 ML (BD ECLIPSE SYRINGE) 25G X 1" 3 ML MISC Use IM 50 each 11   Objective: BP 110/70 mmHg  Pulse 111  Temp(Src) 98.9 F (37.2 C)  Wt 163 lb (73.936 kg)  SpO2 96% Gen: NAD, appears fatigued HEENT: Turbinates erythematous, TM normal, pharynx mildly erythematous with no tonsilar exudate or edema, mild maxillary sinus tenderness. Dry mucus membranes.  CV: RRR no murmurs rubs or gallops Lungs: CTAB no crackles, wheeze, rhonchi Abdomen: soft/nontender/nondistended/normal bowel sounds. Ext: no edema Skin: warm, dry, no rash  Results for orders placed or performed in visit on 06/17/15 (from the past 24 hour(s))  POCT Influenza A     Status: None   Collection Time: 06/17/15 11:14 AM  Result Value Ref Range   Rapid Influenza A Ag neg     Assessment/Plan:  Flu-like illness:  Likely viral illness, possibly flu History and exam today are suggestive of viral process. Patients influenza test was negative.  Pretest probability of influenza was moderate.   Patient will not be treated with Tamiflu. Prophylaxis for other [place] patients: no. With negative flu swab- will not treat husband, encouraged  to wear mask Symptomatic treatment with: zofran for nausea. Needs to push fluids as dehydrated. Send in tessalon for cough. We offered toradol for headache as well as phenergan but she declined. 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 Also gave b12  injection that usually gets at [facility].   Finally, we reviewed reasons to return to care including if symptoms worsen or persist (past next Monday) or new concerns arise (especially, fever, or shortness of breath). If she has nasal discharge and worsening sinus pain should also return to care as could indicate development of bacterial sinusitis.   Meds ordered this encounter  Medications  . ondansetron (ZOFRAN-ODT) 4 MG disintegrating tablet    Sig: Take 1 tablet (4 mg total) by mouth every 8 (eight) hours as needed for nausea or vomiting.    Dispense:  20 tablet    Refill:  0  . benzonatate (TESSALON) 100 MG capsule    Sig: Take 1 capsule (100 mg total) by mouth 2 (two) times daily as needed for cough.    Dispense:  20 capsule    Refill:  0

## 2015-06-18 DIAGNOSIS — J310 Chronic rhinitis: Secondary | ICD-10-CM | POA: Insufficient documentation

## 2015-06-18 DIAGNOSIS — K219 Gastro-esophageal reflux disease without esophagitis: Secondary | ICD-10-CM | POA: Insufficient documentation

## 2015-06-18 DIAGNOSIS — M797 Fibromyalgia: Secondary | ICD-10-CM | POA: Insufficient documentation

## 2015-06-29 ENCOUNTER — Emergency Department (HOSPITAL_COMMUNITY): Payer: BC Managed Care – PPO

## 2015-06-29 ENCOUNTER — Emergency Department (HOSPITAL_COMMUNITY)
Admission: EM | Admit: 2015-06-29 | Discharge: 2015-06-29 | Disposition: A | Discharge status: Other Acute Inpatient Hospital | Payer: BC Managed Care – PPO | Attending: Emergency Medicine | Admitting: Emergency Medicine

## 2015-06-29 ENCOUNTER — Encounter (HOSPITAL_COMMUNITY): Payer: Self-pay

## 2015-06-29 DIAGNOSIS — S064X0A Epidural hemorrhage without loss of consciousness, initial encounter: Secondary | ICD-10-CM | POA: Diagnosis not present

## 2015-06-29 DIAGNOSIS — S22038A Other fracture of third thoracic vertebra, initial encounter for closed fracture: Secondary | ICD-10-CM | POA: Insufficient documentation

## 2015-06-29 DIAGNOSIS — S0501XA Injury of conjunctiva and corneal abrasion without foreign body, right eye, initial encounter: Secondary | ICD-10-CM | POA: Diagnosis not present

## 2015-06-29 DIAGNOSIS — S22009A Unspecified fracture of unspecified thoracic vertebra, initial encounter for closed fracture: Secondary | ICD-10-CM

## 2015-06-29 DIAGNOSIS — Y9389 Activity, other specified: Secondary | ICD-10-CM | POA: Diagnosis not present

## 2015-06-29 DIAGNOSIS — Y998 Other external cause status: Secondary | ICD-10-CM | POA: Diagnosis not present

## 2015-06-29 DIAGNOSIS — S12500A Unspecified displaced fracture of sixth cervical vertebra, initial encounter for closed fracture: Secondary | ICD-10-CM

## 2015-06-29 DIAGNOSIS — S62336A Displaced fracture of neck of fifth metacarpal bone, right hand, initial encounter for closed fracture: Secondary | ICD-10-CM | POA: Insufficient documentation

## 2015-06-29 DIAGNOSIS — Z8719 Personal history of other diseases of the digestive system: Secondary | ICD-10-CM | POA: Insufficient documentation

## 2015-06-29 DIAGNOSIS — S22000A Wedge compression fracture of unspecified thoracic vertebra, initial encounter for closed fracture: Secondary | ICD-10-CM

## 2015-06-29 DIAGNOSIS — W11XXXA Fall on and from ladder, initial encounter: Secondary | ICD-10-CM | POA: Diagnosis not present

## 2015-06-29 DIAGNOSIS — S0990XA Unspecified injury of head, initial encounter: Secondary | ICD-10-CM | POA: Diagnosis present

## 2015-06-29 DIAGNOSIS — Z79899 Other long term (current) drug therapy: Secondary | ICD-10-CM | POA: Insufficient documentation

## 2015-06-29 DIAGNOSIS — S61411A Laceration without foreign body of right hand, initial encounter: Secondary | ICD-10-CM | POA: Insufficient documentation

## 2015-06-29 DIAGNOSIS — S62309A Unspecified fracture of unspecified metacarpal bone, initial encounter for closed fracture: Secondary | ICD-10-CM

## 2015-06-29 DIAGNOSIS — Z23 Encounter for immunization: Secondary | ICD-10-CM | POA: Diagnosis not present

## 2015-06-29 DIAGNOSIS — S60511A Abrasion of right hand, initial encounter: Secondary | ICD-10-CM | POA: Insufficient documentation

## 2015-06-29 DIAGNOSIS — S064XAA Epidural hemorrhage with loss of consciousness status unknown, initial encounter: Secondary | ICD-10-CM

## 2015-06-29 DIAGNOSIS — S064X9A Epidural hemorrhage with loss of consciousness of unspecified duration, initial encounter: Secondary | ICD-10-CM

## 2015-06-29 DIAGNOSIS — F419 Anxiety disorder, unspecified: Secondary | ICD-10-CM | POA: Insufficient documentation

## 2015-06-29 DIAGNOSIS — Y9289 Other specified places as the place of occurrence of the external cause: Secondary | ICD-10-CM | POA: Diagnosis not present

## 2015-06-29 HISTORY — DX: Wedge compression fracture of unspecified thoracic vertebra, initial encounter for closed fracture: S22.000A

## 2015-06-29 HISTORY — DX: Epidural hemorrhage with loss of consciousness of unspecified duration, initial encounter: S06.4X9A

## 2015-06-29 HISTORY — DX: Unspecified atrial fibrillation: I48.91

## 2015-06-29 HISTORY — DX: Unspecified osteoarthritis, unspecified site: M19.90

## 2015-06-29 HISTORY — DX: Unspecified displaced fracture of sixth cervical vertebra, initial encounter for closed fracture: S12.500A

## 2015-06-29 HISTORY — DX: Irritable bowel syndrome, unspecified: K58.9

## 2015-06-29 LAB — COMPREHENSIVE METABOLIC PANEL
ALK PHOS: 44 U/L (ref 38–126)
ALT: 19 U/L (ref 14–54)
AST: 28 U/L (ref 15–41)
Albumin: 3.7 g/dL (ref 3.5–5.0)
Anion gap: 9 (ref 5–15)
BILIRUBIN TOTAL: 0.7 mg/dL (ref 0.3–1.2)
BUN: 6 mg/dL (ref 6–20)
CALCIUM: 8.8 mg/dL — AB (ref 8.9–10.3)
CO2: 23 mmol/L (ref 22–32)
CREATININE: 0.67 mg/dL (ref 0.44–1.00)
Chloride: 107 mmol/L (ref 101–111)
GFR calc Af Amer: >60 mL/min (ref >60)
Glucose, Bld: 114 mg/dL — ABNORMAL HIGH (ref 65–99)
POTASSIUM: 3.1 mmol/L — AB (ref 3.5–5.1)
Sodium: 139 mmol/L (ref 135–145)
TOTAL PROTEIN: 6.3 g/dL — AB (ref 6.5–8.1)

## 2015-06-29 LAB — CBC
HCT: 35.6 % — ABNORMAL LOW (ref 36.0–46.0)
Hemoglobin: 12.1 g/dL (ref 12.0–15.0)
MCH: 29.7 pg (ref 26.0–34.0)
MCHC: 34 g/dL (ref 30.0–36.0)
MCV: 87.3 fL (ref 78.0–100.0)
PLATELETS: 436 10*3/uL — AB (ref 150–400)
RBC: 4.08 MIL/uL (ref 3.87–5.11)
RDW: 12.9 % (ref 11.5–15.5)
WBC: 12 10*3/uL — AB (ref 4.0–10.5)

## 2015-06-29 LAB — SAMPLE TO BLOOD BANK

## 2015-06-29 LAB — PROTIME-INR
INR: 1.16 (ref 0.00–1.49)
PROTHROMBIN TIME: 14.9 s (ref 11.6–15.2)

## 2015-06-29 LAB — ETHANOL

## 2015-06-29 LAB — CDS SEROLOGY

## 2015-06-29 MED ORDER — HYDROMORPHONE HCL 1 MG/ML IJ SOLN
INTRAMUSCULAR | Status: AC
  Filled 2015-06-29: qty 1

## 2015-06-29 MED ORDER — ONDANSETRON HCL 4 MG/2ML IJ SOLN
4.0000 mg | Freq: Once | INTRAMUSCULAR | Status: AC | PRN
  Administered 2015-06-29: 4 mg via INTRAVENOUS
  Filled 2015-06-29: qty 2

## 2015-06-29 MED ORDER — ONDANSETRON HCL 4 MG/2ML IJ SOLN
4.0000 mg | Freq: Once | INTRAMUSCULAR | Status: AC
  Administered 2015-06-29: 4 mg via INTRAVENOUS
  Filled 2015-06-29: qty 2

## 2015-06-29 MED ORDER — HYDROMORPHONE HCL 1 MG/ML IJ SOLN
0.5000 mg | Freq: Once | INTRAMUSCULAR | Status: AC
  Administered 2015-06-29: 0.5 mg via INTRAVENOUS

## 2015-06-29 MED ORDER — TETANUS-DIPHTH-ACELL PERTUSSIS 5-2.5-18.5 LF-MCG/0.5 IM SUSP
0.5000 mL | Freq: Once | INTRAMUSCULAR | Status: AC
  Administered 2015-06-29: 0.5 mL via INTRAMUSCULAR
  Filled 2015-06-29: qty 0.5

## 2015-06-29 MED ORDER — IOPAMIDOL (ISOVUE-300) INJECTION 61%
80.0000 mL | Freq: Once | INTRAVENOUS | Status: AC | PRN
  Administered 2015-06-29: 100 mL via INTRAVENOUS

## 2015-06-29 MED ORDER — FENTANYL CITRATE (PF) 100 MCG/2ML IJ SOLN
50.0000 ug | Freq: Once | INTRAMUSCULAR | Status: DC
Start: 2015-06-29 — End: 2015-06-29

## 2015-06-29 NOTE — ED Notes (Signed)
Ortho paged. 

## 2015-06-29 NOTE — ED Notes (Signed)
[facility]- pt here after she fell approximately 78ft from a roof/ladder. Pt fell head first. C-collar in place. Swelling noted to right eye. No LOC. Pt has GCS of 15 and has throughout transport. Vital signs stable.

## 2015-06-29 NOTE — ED Notes (Signed)
Called CT, patient is ready for transport.

## 2015-06-29 NOTE — ED Notes (Signed)
Spoke with Regina [name], she reviewes patient is a level 2, and waiting on lab results.

## 2015-06-29 NOTE — ED Notes (Signed)
Verbal order for 0.5mg  of dilaudid per dr. Jeanell Sparrow

## 2015-06-29 NOTE — ED Notes (Signed)
Reported to Dr. Jeanell Sparrow patient is nauseous,

## 2015-06-29 NOTE — Progress Notes (Signed)
Orthopedic Tech Progress Note Patient Details:  Regina [name] 02/17/1966 [id number]  Ortho Devices Type of Ortho Device: Ace wrap, Ulna gutter splint Ortho Device/Splint Location: RUE Ortho Device/Splint Interventions: Ordered, Application   [name] 06/29/2015, 10:52 PM

## 2015-06-29 NOTE — ED Notes (Signed)
Spoke to ortho tech, coming to place splint.

## 2015-06-29 NOTE — ED Notes (Signed)
Dr. Barry Dienes at the bedside. Verbal order for foley placement per Dr. Jeanell Sparrow.

## 2015-06-29 NOTE — ED Provider Notes (Signed)
CSN: [id number]     Arrival date & time 06/29/15  1843 History   First MD Initiated Contact with Patient 06/29/15 1910     Chief Complaint  Patient presents with  . Fall     (Consider location/radiation/quality/duration/timing/severity/associated sxs/prior Treatment) Patient is a 50 y.o. female presenting with fall. The history is provided by the patient and the EMS personnel.  Fall This is a new (Patient fell 35 feet off ladder today landing on her head. Denies LOC) problem. The current episode started today. Associated symptoms include headaches and neck pain. Pertinent negatives include no abdominal pain, arthralgias, chest pain, chills, coughing, diaphoresis, fatigue, fever, myalgias, nausea, rash, sore throat, vomiting or weakness. Nothing aggravates the symptoms. The treatment provided no relief.    Past Medical History  Diagnosis Date  . Atrial fibrillation ([place])   . Arthritis   . IBS (irritable bowel syndrome)   . Fibromyalgia    Past Surgical History  Procedure Laterality Date  . Abdominal hysterectomy     History reviewed. No pertinent family history. Social History  Substance Use Topics  . Smoking status: Never Smoker   . Smokeless tobacco: None  . Alcohol Use: Yes     Comment: occasional    OB History    No data available     Review of Systems  Constitutional: Negative for fever, chills, diaphoresis, activity change, appetite change and fatigue.  HENT: Negative for facial swelling, rhinorrhea, sore throat, trouble swallowing and voice change.   Eyes: Negative for photophobia, pain and visual disturbance.  Respiratory: Negative for cough, shortness of breath, wheezing and stridor.   Cardiovascular: Negative for chest pain, palpitations and leg swelling.  Gastrointestinal: Negative for nausea, vomiting, abdominal pain, constipation and anal bleeding.  Endocrine: Negative.   Genitourinary: Negative for dysuria, vaginal bleeding, vaginal discharge and vaginal  pain.  Musculoskeletal: Positive for back pain and neck pain. Negative for myalgias and arthralgias.  Skin: Positive for wound (abrasion to frontal scalp and shin, laceration to right hand). Negative for rash.  Allergic/Immunologic: Negative.   Neurological: Positive for headaches. Negative for dizziness, tremors, syncope (denies LOC) and weakness.  Psychiatric/Behavioral: Negative for suicidal ideas, sleep disturbance and self-injury.  All other systems reviewed and are negative.     Allergies  Morphine and related; Sulfa antibiotics; and Azithromycin  Home Medications   Prior to Admission medications   Medication Sig Start Date End Date Taking? Authorizing Provider  Cyanocobalamin (VITAMIN B 12 PO) Take 1 tablet by mouth daily.   Yes Historical Provider, MD  Cyanocobalamin (VITAMIN B-12 IJ) Inject 1 Applicatorful as directed every 14 (fourteen) days.   Yes Historical Provider, MD   BP 119/76 mmHg  Pulse 103  Temp(Src) 97.9 F (36.6 C) (Oral)  Resp 22  Ht 5\' 4"  (1.626 m)  Wt 73.936 kg  BMI 27.97 kg/m2  SpO2 97% Physical Exam  Constitutional: She is oriented to person, place, and time. She appears well-developed and well-nourished. No distress.  HENT:  Head: Normocephalic.  Right Ear: External ear normal.  Left Ear: External ear normal.  Mouth/Throat: No oropharyngeal exudate.  Abrasion above right eye lid without laceration. Right scalp tenderness without overlying laceration   Eyes: Conjunctivae and EOM are normal. Pupils are equal, round, and reactive to light. No scleral icterus.  Neck: No JVD present. No tracheal deviation present. No thyromegaly present.  C collr in place  Cardiovascular: Normal rate, regular rhythm and intact distal pulses.   Pulmonary/Chest: Effort normal and breath sounds normal.  No respiratory distress. She has no wheezes. She has no rales. She exhibits tenderness.  Abdominal: Soft. Bowel sounds are normal. She exhibits no distension. There is no  tenderness.  Musculoskeletal: Normal range of motion. She exhibits tenderness (to right plam with 4 cm laceration, hemostatic. Midline thoracic back pain. ). She exhibits no edema.  Neurological: She is alert and oriented to person, place, and time. No cranial nerve deficit. She exhibits normal muscle tone. Coordination normal.  Full ROM of all extremities. NVI. GCS 15.   Skin: Skin is warm and dry. She is not diaphoretic. No pallor.  Abrasions to face and right hand lac as above  Psychiatric: She has a normal mood and affect. She expresses no homicidal and no suicidal ideation. She expresses no suicidal plans and no homicidal plans.  Nursing note and vitals reviewed.   ED Course  Procedures (including critical care time) Labs Review Labs Reviewed  COMPREHENSIVE METABOLIC PANEL - Abnormal; Notable for the following:    Potassium 3.1 (*)    Glucose, Bld 114 (*)    Calcium 8.8 (*)    Total Protein 6.3 (*)    All other components within normal limits  CBC - Abnormal; Notable for the following:    WBC 12.0 (*)    HCT 35.6 (*)    Platelets 436 (*)    All other components within normal limits  CDS SEROLOGY  ETHANOL  PROTIME-INR  SAMPLE TO BLOOD BANK    Imaging Review Ct Head Wo Contrast  06/29/2015  CLINICAL DATA:  Fall 30 feet from roof/ladder. EXAM: CT HEAD WITHOUT CONTRAST CT CERVICAL SPINE WITHOUT CONTRAST TECHNIQUE: Multidetector CT imaging of the head and cervical spine was performed following the standard protocol without intravenous contrast. Multiplanar CT image reconstructions of the cervical spine were also generated. COMPARISON:  None. FINDINGS: CT HEAD FINDINGS Slightly displaced fracture of the right frontoparietal bone. Underlying acute epidural hemorrhage measuring approximately 9 mm greatest thickness with slight mass effect on the underlying right frontoparietal lobe. No appreciable midline shift. Ventricles are normal in size and configuration. No parenchymal hemorrhage.  Soft tissue edema/hematoma overlying the right frontoparietal bone measures up to 1.5 cm thickness. CT CERVICAL SPINE FINDINGS Alignment of the cervical spine is normal. Minimally displaced fracture lines noted within the bilateral posterior lamina of C6. Fracture risk do not involve the more anterior facets or pedicles. No vertebral body fracture at this or any other level of the cervical spine. Paravertebral soft tissues are unremarkable. IMPRESSION: 1. Acute epidural hemorrhage overlying the right frontoparietal lobe, measuring 9 mm greatest thickness with slight mass effect on the underlying frontoparietal lobe parenchyma. No appreciable midline shift. 2. Slightly displaced fracture of the right frontoparietal bone, at the site of the acute epidural hemorrhage described above. Scalp hematoma overlies this right frontoparietal bone fracture. 3. Minimally displaced, obliquely oriented fracture involving the bilateral posterior lamina of C6. This configuration suggests stable fracture. No fracture extension into the C6 facets or pedicles. No vertebral body fracture at this or any other level. Remainder of the cervical spine appears intact and normally aligned. Critical Value/emergent results were called by telephone at the time of interpretation on 06/29/2015 at 8:52 pm to Dr. Pattricia Boss , who verbally acknowledged these results. Electronically Signed   By: Franki Cabot M.D.   On: 06/29/2015 20:56   Ct Chest W Contrast  06/29/2015  CLINICAL DATA:  Fall approximately 30 feet from ladder. EXAM: CT CHEST, ABDOMEN, AND PELVIS WITH CONTRAST TECHNIQUE: Multidetector CT  imaging of the chest, abdomen and pelvis was performed following the standard protocol during bolus administration of intravenous contrast. CONTRAST:  119mL ISOVUE-300 IOPAMIDOL (ISOVUE-300) INJECTION 61% COMPARISON:  Chest and pelvic radiographs of 06/29/2015. FINDINGS: CT CHEST FINDINGS Mediastinum/Lymph Nodes: Normal aorta, without evidence of aortic  laceration or mediastinal hematoma. Normal heart size, without pericardial effusion. No mediastinal or hilar adenopathy. Tiny hiatal hernia. Lungs/Pleura: Trace left pleural fluid. No pneumothorax. Mild bibasilar atelectasis. Musculoskeletal: Nondisplaced fracture of the left T3 through T7 transverse processes. CT ABDOMEN PELVIS FINDINGS Hepatobiliary: Normal liver. Normal gallbladder, without biliary ductal dilatation. Pancreas: Normal, without mass or ductal dilatation. Spleen: Normal in size, without focal abnormality. Adrenals/Urinary Tract: Normal adrenal glands. Normal left kidney. Partially duplicated right renal collecting system. Normal urinary bladder. Stomach/Bowel: Normal remainder of the stomach. Scattered colonic diverticula. Normal colon and terminal ileum. Normal small bowel. No pneumatosis or free intraperitoneal air. Vascular/Lymphatic: Normal caliber of the aorta and branch vessels. No abdominopelvic adenopathy. Reproductive: Hysterectomy.  No adnexal mass. Other: No significant free fluid. Musculoskeletal: No acute osseous abnormality. IMPRESSION: 1. Left transverse process nondisplaced fractures at T3 through T7. 2. No other posttraumatic deformity identified. 3. Trace left pleural fluid. Electronically Signed   By: Abigail Miyamoto M.D.   On: 06/29/2015 20:58   Ct Cervical Spine Wo Contrast  06/29/2015  CLINICAL DATA:  Fall 30 feet from roof/ladder. EXAM: CT HEAD WITHOUT CONTRAST CT CERVICAL SPINE WITHOUT CONTRAST TECHNIQUE: Multidetector CT imaging of the head and cervical spine was performed following the standard protocol without intravenous contrast. Multiplanar CT image reconstructions of the cervical spine were also generated. COMPARISON:  None. FINDINGS: CT HEAD FINDINGS Slightly displaced fracture of the right frontoparietal bone. Underlying acute epidural hemorrhage measuring approximately 9 mm greatest thickness with slight mass effect on the underlying right frontoparietal lobe. No  appreciable midline shift. Ventricles are normal in size and configuration. No parenchymal hemorrhage. Soft tissue edema/hematoma overlying the right frontoparietal bone measures up to 1.5 cm thickness. CT CERVICAL SPINE FINDINGS Alignment of the cervical spine is normal. Minimally displaced fracture lines noted within the bilateral posterior lamina of C6. Fracture risk do not involve the more anterior facets or pedicles. No vertebral body fracture at this or any other level of the cervical spine. Paravertebral soft tissues are unremarkable. IMPRESSION: 1. Acute epidural hemorrhage overlying the right frontoparietal lobe, measuring 9 mm greatest thickness with slight mass effect on the underlying frontoparietal lobe parenchyma. No appreciable midline shift. 2. Slightly displaced fracture of the right frontoparietal bone, at the site of the acute epidural hemorrhage described above. Scalp hematoma overlies this right frontoparietal bone fracture. 3. Minimally displaced, obliquely oriented fracture involving the bilateral posterior lamina of C6. This configuration suggests stable fracture. No fracture extension into the C6 facets or pedicles. No vertebral body fracture at this or any other level. Remainder of the cervical spine appears intact and normally aligned. Critical Value/emergent results were called by telephone at the time of interpretation on 06/29/2015 at 8:52 pm to Dr. Pattricia Boss , who verbally acknowledged these results. Electronically Signed   By: Franki Cabot M.D.   On: 06/29/2015 20:56   Ct Abdomen Pelvis W Contrast  06/29/2015  CLINICAL DATA:  Fall approximately 30 feet from ladder. EXAM: CT CHEST, ABDOMEN, AND PELVIS WITH CONTRAST TECHNIQUE: Multidetector CT imaging of the chest, abdomen and pelvis was performed following the standard protocol during bolus administration of intravenous contrast. CONTRAST:  165mL ISOVUE-300 IOPAMIDOL (ISOVUE-300) INJECTION 61% COMPARISON:  Chest and pelvic  radiographs of 06/29/2015. FINDINGS: CT CHEST FINDINGS Mediastinum/Lymph Nodes: Normal aorta, without evidence of aortic laceration or mediastinal hematoma. Normal heart size, without pericardial effusion. No mediastinal or hilar adenopathy. Tiny hiatal hernia. Lungs/Pleura: Trace left pleural fluid. No pneumothorax. Mild bibasilar atelectasis. Musculoskeletal: Nondisplaced fracture of the left T3 through T7 transverse processes. CT ABDOMEN PELVIS FINDINGS Hepatobiliary: Normal liver. Normal gallbladder, without biliary ductal dilatation. Pancreas: Normal, without mass or ductal dilatation. Spleen: Normal in size, without focal abnormality. Adrenals/Urinary Tract: Normal adrenal glands. Normal left kidney. Partially duplicated right renal collecting system. Normal urinary bladder. Stomach/Bowel: Normal remainder of the stomach. Scattered colonic diverticula. Normal colon and terminal ileum. Normal small bowel. No pneumatosis or free intraperitoneal air. Vascular/Lymphatic: Normal caliber of the aorta and branch vessels. No abdominopelvic adenopathy. Reproductive: Hysterectomy.  No adnexal mass. Other: No significant free fluid. Musculoskeletal: No acute osseous abnormality. IMPRESSION: 1. Left transverse process nondisplaced fractures at T3 through T7. 2. No other posttraumatic deformity identified. 3. Trace left pleural fluid. Electronically Signed   By: Abigail Miyamoto M.D.   On: 06/29/2015 20:58   Dg Pelvis Portable  06/29/2015  CLINICAL DATA:  Fall from roof. EXAM: PORTABLE PELVIS 1-2 VIEWS COMPARISON:  None. FINDINGS: There is no evidence of pelvic fracture or diastasis. No pelvic bone lesions are seen. Soft tissues about the pelvis and lower abdomen are unremarkable. IMPRESSION: Negative. Electronically Signed   By: Franki Cabot M.D.   On: 06/29/2015 19:29   Dg Chest Portable 1 View  06/29/2015  CLINICAL DATA:  Fall from roof, history of atrial fibrillation. EXAM: PORTABLE CHEST 1 VIEW COMPARISON:  None.  FINDINGS: The heart size and mediastinal contours are within normal limits. Both lungs are clear, given the oblique patient positioning. No osseous fracture or dislocation seen. IMPRESSION: No acute findings. Electronically Signed   By: Franki Cabot M.D.   On: 06/29/2015 19:28   Dg Hand Complete Right  06/29/2015  CLINICAL DATA:  Initial encounter for Fall from building, about 2 stories, right hand laceration- anterior hand at 5th metacarpal. EXAM: RIGHT HAND - COMPLETE 3+ VIEW COMPARISON:  None. FINDINGS: Overlap of fingers on the lateral. Possible nondisplaced fracture at the fifth metacarpal neck, most apparent on the oblique image. No growth plate extension. IMPRESSION: Possible nondisplaced fracture involving the fifth metacarpal neck. Correlate with point tenderness. Electronically Signed   By: Abigail Miyamoto M.D.   On: 06/29/2015 21:18   I have personally reviewed and evaluated these images and lab results as part of my medical decision-making.   EKG Interpretation None      MDM   Final diagnoses:  Epidural hematoma ([facility])  Fracture of transverse process of thoracic vertebra, closed, initial encounter ([place])  Metacarpal bone fracture, closed, initial encounter    The patient is a 50 year old female who presents after suffering a 35 foot fall off of a ladder onto a hard ground. Patient reportedly went on her head. Patient denies any loss of consciousness. Physical exam as above. Full trauma scans as well as focused extremity plain films are obtained and show an epidural hematoma with displaced skull fracture as well as C6 fracture, multiple thoracic transverse process fractures, and right hand fifth metacarpal fracture. Neurosurgery's consulted immediately upon epidural read and evaluates the patient and the ED. As they are taking another emergently ill patient directly to the OR they do not have capacity to admit her and closely monitor this patient and recommended transfer to [facility]. Right hand laceration is  thoroughly washed out and packed and right hand is placed in ulnar gutter splint. C-spine maintained in hard collar. I've spoken with the neurosurgeon on call at [facility], Dr. Bridgette Habermann, who accepts the patient in transfer. Patient is transferred to [facility] emergency department for neurosurgery and trauma evaluation. Patient and family expressed understanding and agreement with this plan.  Patient seen with attending, Dr. Jeanell Sparrow, who oversaw clinical decision making.     Margaretann Loveless, MD 06/30/15 [id number]  Pattricia Boss, MD 06/30/15 1500

## 2015-06-29 NOTE — ED Notes (Signed)
Spoke with CT as patient needs scans completed per Dr. Jeanell Sparrow.

## 2015-06-29 NOTE — Consult Note (Signed)
Reason for Consult:Fall from ladder Referring Physician: Pattricia Boss, MD  Regina [name] is an 50 y.o. female.  HPI:  Patient is a 50 year old female who was on a ladder today and fell 35 feet landing on her head. She immediately had headache and back pain. She also complains of nausea and some dizziness. Patient denies chest pain or abdominal pain. She also denied loss of consciousness. There is no history of drug or alcohol use today.  Past Medical History  Diagnosis Date  . Atrial fibrillation ([place])   . Arthritis   . IBS (irritable bowel syndrome)   . Fibromyalgia     Past Surgical History  Procedure Laterality Date  . Abdominal hysterectomy      History reviewed. No pertinent family history.  Social History:  reports that she has never smoked. She does not have any smokeless tobacco history on file. She reports that she drinks alcohol. Her drug history is not on file.  Allergies:  Allergies  Allergen Reactions  . Morphine And Related     nausea  . Sulfa Antibiotics Swelling  . Azithromycin Rash    ALL MYCIN DRUGS     Medications: Vitamin B12  Results for orders placed or performed during the hospital encounter of 06/29/15 (from the past 48 hour(s))  CDS serology     Status: None   Collection Time: 06/29/15  7:03 PM  Result Value Ref Range   CDS serology specimen      SPECIMEN WILL BE HELD FOR 14 DAYS IF TESTING IS REQUIRED  Comprehensive metabolic panel     Status: Abnormal   Collection Time: 06/29/15  7:03 PM  Result Value Ref Range   Sodium 139 135 - 145 mmol/L   Potassium 3.1 (L) 3.5 - 5.1 mmol/L   Chloride 107 101 - 111 mmol/L   CO2 23 22 - 32 mmol/L   Glucose, Bld 114 (H) 65 - 99 mg/dL   BUN 6 6 - 20 mg/dL   Creatinine, Ser 0.67 0.44 - 1.00 mg/dL   Calcium 8.8 (L) 8.9 - 10.3 mg/dL   Total Protein 6.3 (L) 6.5 - 8.1 g/dL   Albumin 3.7 3.5 - 5.0 g/dL   AST 28 15 - 41 U/L   ALT 19 14 - 54 U/L   Alkaline Phosphatase 44 38 - 126 U/L   Total Bilirubin 0.7  0.3 - 1.2 mg/dL   GFR calc non Af Amer >60 >60 mL/min   GFR calc Af Amer >60 >60 mL/min    Comment: (NOTE) The eGFR has been calculated using the CKD EPI equation. This calculation has not been validated in all clinical situations. eGFR's persistently <60 mL/min signify possible Chronic Kidney Disease.    Anion gap 9 5 - 15  CBC     Status: Abnormal   Collection Time: 06/29/15  7:03 PM  Result Value Ref Range   WBC 12.0 (H) 4.0 - 10.5 K/uL   RBC 4.08 3.87 - 5.11 MIL/uL   Hemoglobin 12.1 12.0 - 15.0 g/dL   HCT 35.6 (L) 36.0 - 46.0 %   MCV 87.3 78.0 - 100.0 fL   MCH 29.7 26.0 - 34.0 pg   MCHC 34.0 30.0 - 36.0 g/dL   RDW 12.9 11.5 - 15.5 %   Platelets 436 (H) 150 - 400 K/uL  Ethanol     Status: None   Collection Time: 06/29/15  7:03 PM  Result Value Ref Range   Alcohol, Ethyl (B) <5 <5 mg/dL    Comment:  LOWEST DETECTABLE LIMIT FOR SERUM ALCOHOL IS 5 mg/dL FOR MEDICAL PURPOSES ONLY   Protime-INR     Status: None   Collection Time: 06/29/15  7:03 PM  Result Value Ref Range   Prothrombin Time 14.9 11.6 - 15.2 seconds   INR 1.16 0.00 - 1.49  Sample to Blood Bank     Status: None   Collection Time: 06/29/15  7:03 PM  Result Value Ref Range   Blood Bank Specimen SAMPLE AVAILABLE FOR TESTING    Sample Expiration 06/30/2015     Ct Head Wo Contrast  06/29/2015  CLINICAL DATA:  Fall 30 feet from roof/ladder. EXAM: CT HEAD WITHOUT CONTRAST CT CERVICAL SPINE WITHOUT CONTRAST TECHNIQUE: Multidetector CT imaging of the head and cervical spine was performed following the standard protocol without intravenous contrast. Multiplanar CT image reconstructions of the cervical spine were also generated. COMPARISON:  None. FINDINGS: CT HEAD FINDINGS Slightly displaced fracture of the right frontoparietal bone. Underlying acute epidural hemorrhage measuring approximately 9 mm greatest thickness with slight mass effect on the underlying right frontoparietal lobe. No appreciable midline shift.  Ventricles are normal in size and configuration. No parenchymal hemorrhage. Soft tissue edema/hematoma overlying the right frontoparietal bone measures up to 1.5 cm thickness. CT CERVICAL SPINE FINDINGS Alignment of the cervical spine is normal. Minimally displaced fracture lines noted within the bilateral posterior lamina of C6. Fracture risk do not involve the more anterior facets or pedicles. No vertebral body fracture at this or any other level of the cervical spine. Paravertebral soft tissues are unremarkable. IMPRESSION: 1. Acute epidural hemorrhage overlying the right frontoparietal lobe, measuring 9 mm greatest thickness with slight mass effect on the underlying frontoparietal lobe parenchyma. No appreciable midline shift. 2. Slightly displaced fracture of the right frontoparietal bone, at the site of the acute epidural hemorrhage described above. Scalp hematoma overlies this right frontoparietal bone fracture. 3. Minimally displaced, obliquely oriented fracture involving the bilateral posterior lamina of C6. This configuration suggests stable fracture. No fracture extension into the C6 facets or pedicles. No vertebral body fracture at this or any other level. Remainder of the cervical spine appears intact and normally aligned. Critical Value/emergent results were called by telephone at the time of interpretation on 06/29/2015 at 8:52 pm to Dr. Pattricia Boss , who verbally acknowledged these results. Electronically Signed   By: Franki Cabot M.D.   On: 06/29/2015 20:56   Ct Chest W Contrast  06/29/2015  CLINICAL DATA:  Fall approximately 30 feet from ladder. EXAM: CT CHEST, ABDOMEN, AND PELVIS WITH CONTRAST TECHNIQUE: Multidetector CT imaging of the chest, abdomen and pelvis was performed following the standard protocol during bolus administration of intravenous contrast. CONTRAST:  163m ISOVUE-300 IOPAMIDOL (ISOVUE-300) INJECTION 61% COMPARISON:  Chest and pelvic radiographs of 06/29/2015. FINDINGS: CT  CHEST FINDINGS Mediastinum/Lymph Nodes: Normal aorta, without evidence of aortic laceration or mediastinal hematoma. Normal heart size, without pericardial effusion. No mediastinal or hilar adenopathy. Tiny hiatal hernia. Lungs/Pleura: Trace left pleural fluid. No pneumothorax. Mild bibasilar atelectasis. Musculoskeletal: Nondisplaced fracture of the left T3 through T7 transverse processes. CT ABDOMEN PELVIS FINDINGS Hepatobiliary: Normal liver. Normal gallbladder, without biliary ductal dilatation. Pancreas: Normal, without mass or ductal dilatation. Spleen: Normal in size, without focal abnormality. Adrenals/Urinary Tract: Normal adrenal glands. Normal left kidney. Partially duplicated right renal collecting system. Normal urinary bladder. Stomach/Bowel: Normal remainder of the stomach. Scattered colonic diverticula. Normal colon and terminal ileum. Normal small bowel. No pneumatosis or free intraperitoneal air. Vascular/Lymphatic: Normal caliber of the aorta and  branch vessels. No abdominopelvic adenopathy. Reproductive: Hysterectomy.  No adnexal mass. Other: No significant free fluid. Musculoskeletal: No acute osseous abnormality. IMPRESSION: 1. Left transverse process nondisplaced fractures at T3 through T7. 2. No other posttraumatic deformity identified. 3. Trace left pleural fluid. Electronically Signed   By: Abigail Miyamoto M.D.   On: 06/29/2015 20:58   Ct Cervical Spine Wo Contrast  06/29/2015  CLINICAL DATA:  Fall 30 feet from roof/ladder. EXAM: CT HEAD WITHOUT CONTRAST CT CERVICAL SPINE WITHOUT CONTRAST TECHNIQUE: Multidetector CT imaging of the head and cervical spine was performed following the standard protocol without intravenous contrast. Multiplanar CT image reconstructions of the cervical spine were also generated. COMPARISON:  None. FINDINGS: CT HEAD FINDINGS Slightly displaced fracture of the right frontoparietal bone. Underlying acute epidural hemorrhage measuring approximately 9 mm greatest  thickness with slight mass effect on the underlying right frontoparietal lobe. No appreciable midline shift. Ventricles are normal in size and configuration. No parenchymal hemorrhage. Soft tissue edema/hematoma overlying the right frontoparietal bone measures up to 1.5 cm thickness. CT CERVICAL SPINE FINDINGS Alignment of the cervical spine is normal. Minimally displaced fracture lines noted within the bilateral posterior lamina of C6. Fracture risk do not involve the more anterior facets or pedicles. No vertebral body fracture at this or any other level of the cervical spine. Paravertebral soft tissues are unremarkable. IMPRESSION: 1. Acute epidural hemorrhage overlying the right frontoparietal lobe, measuring 9 mm greatest thickness with slight mass effect on the underlying frontoparietal lobe parenchyma. No appreciable midline shift. 2. Slightly displaced fracture of the right frontoparietal bone, at the site of the acute epidural hemorrhage described above. Scalp hematoma overlies this right frontoparietal bone fracture. 3. Minimally displaced, obliquely oriented fracture involving the bilateral posterior lamina of C6. This configuration suggests stable fracture. No fracture extension into the C6 facets or pedicles. No vertebral body fracture at this or any other level. Remainder of the cervical spine appears intact and normally aligned. Critical Value/emergent results were called by telephone at the time of interpretation on 06/29/2015 at 8:52 pm to Dr. Pattricia Boss , who verbally acknowledged these results. Electronically Signed   By: Franki Cabot M.D.   On: 06/29/2015 20:56   Ct Abdomen Pelvis W Contrast  06/29/2015  CLINICAL DATA:  Fall approximately 30 feet from ladder. EXAM: CT CHEST, ABDOMEN, AND PELVIS WITH CONTRAST TECHNIQUE: Multidetector CT imaging of the chest, abdomen and pelvis was performed following the standard protocol during bolus administration of intravenous contrast. CONTRAST:  178m  ISOVUE-300 IOPAMIDOL (ISOVUE-300) INJECTION 61% COMPARISON:  Chest and pelvic radiographs of 06/29/2015. FINDINGS: CT CHEST FINDINGS Mediastinum/Lymph Nodes: Normal aorta, without evidence of aortic laceration or mediastinal hematoma. Normal heart size, without pericardial effusion. No mediastinal or hilar adenopathy. Tiny hiatal hernia. Lungs/Pleura: Trace left pleural fluid. No pneumothorax. Mild bibasilar atelectasis. Musculoskeletal: Nondisplaced fracture of the left T3 through T7 transverse processes. CT ABDOMEN PELVIS FINDINGS Hepatobiliary: Normal liver. Normal gallbladder, without biliary ductal dilatation. Pancreas: Normal, without mass or ductal dilatation. Spleen: Normal in size, without focal abnormality. Adrenals/Urinary Tract: Normal adrenal glands. Normal left kidney. Partially duplicated right renal collecting system. Normal urinary bladder. Stomach/Bowel: Normal remainder of the stomach. Scattered colonic diverticula. Normal colon and terminal ileum. Normal small bowel. No pneumatosis or free intraperitoneal air. Vascular/Lymphatic: Normal caliber of the aorta and branch vessels. No abdominopelvic adenopathy. Reproductive: Hysterectomy.  No adnexal mass. Other: No significant free fluid. Musculoskeletal: No acute osseous abnormality. IMPRESSION: 1. Left transverse process nondisplaced fractures at T3 through T7. 2.  No other posttraumatic deformity identified. 3. Trace left pleural fluid. Electronically Signed   By: Abigail Miyamoto M.D.   On: 06/29/2015 20:58   Dg Pelvis Portable  06/29/2015  CLINICAL DATA:  Fall from roof. EXAM: PORTABLE PELVIS 1-2 VIEWS COMPARISON:  None. FINDINGS: There is no evidence of pelvic fracture or diastasis. No pelvic bone lesions are seen. Soft tissues about the pelvis and lower abdomen are unremarkable. IMPRESSION: Negative. Electronically Signed   By: Franki Cabot M.D.   On: 06/29/2015 19:29   Dg Chest Portable 1 View  06/29/2015  CLINICAL DATA:  Fall from roof,  history of atrial fibrillation. EXAM: PORTABLE CHEST 1 VIEW COMPARISON:  None. FINDINGS: The heart size and mediastinal contours are within normal limits. Both lungs are clear, given the oblique patient positioning. No osseous fracture or dislocation seen. IMPRESSION: No acute findings. Electronically Signed   By: Franki Cabot M.D.   On: 06/29/2015 19:28   Dg Hand Complete Right  06/29/2015  CLINICAL DATA:  Initial encounter for Fall from building, about 2 stories, right hand laceration- anterior hand at 5th metacarpal. EXAM: RIGHT HAND - COMPLETE 3+ VIEW COMPARISON:  None. FINDINGS: Overlap of fingers on the lateral. Possible nondisplaced fracture at the fifth metacarpal neck, most apparent on the oblique image. No growth plate extension. IMPRESSION: Possible nondisplaced fracture involving the fifth metacarpal neck. Correlate with point tenderness. Electronically Signed   By: Abigail Miyamoto M.D.   On: 06/29/2015 21:18    Review of Systems  Constitutional: Negative.   Eyes: Negative.   Respiratory: Negative.   Cardiovascular: Negative.   Gastrointestinal: Positive for nausea.  Genitourinary: Negative.   Musculoskeletal: Positive for back pain.  Skin: Negative.   Neurological: Positive for dizziness and headaches.  Endo/Heme/Allergies: Negative.   Psychiatric/Behavioral: Negative.    Blood pressure 119/76, pulse 103, temperature 97.9 F (36.6 C), temperature source Oral, resp. rate 22, height '5\' 4"'$  (1.626 m), weight 73.936 kg (163 lb), SpO2 97 %. Physical Exam  Constitutional: She is oriented to person, place, and time. She appears well-developed and well-nourished. She appears distressed.  HENT:  Right Ear: External ear normal.  Left Ear: External ear normal.  Nose: Nose normal.  Mouth/Throat: Oropharynx is clear and moist. No oropharyngeal exudate.  Bruising on right temporal region.    Eyes: Conjunctivae are normal. Pupils are equal, round, and reactive to light. Right eye exhibits no  discharge. Left eye exhibits no discharge. No scleral icterus.  Neck: Neck supple. No thyromegaly present.  Cardiovascular: Normal rate, regular rhythm, normal heart sounds and intact distal pulses.   Respiratory: Effort normal and breath sounds normal. No respiratory distress. She exhibits no tenderness.  GI: Soft. She exhibits no distension. There is no tenderness. There is no rebound.  Musculoskeletal: Normal range of motion.  Lymphadenopathy:    She has no cervical adenopathy.  Neurological: She is alert and oriented to person, place, and time. Coordination normal.  Skin: Skin is warm and dry. No rash noted. She is not diaphoretic. No erythema. No pallor.  Psychiatric: She has a normal mood and affect. Her behavior is normal. Judgment and thought content normal.  Patient and daughter very anxious    Assessment/Plan: Fall Right epidural hemorrhage and skull fracture T3-T7 TP fx Right metacarpal fx and associated laceration.    Pt currently stable with no neuro deficits.   Given the fact that neurosurgery is starting very long neuro trauma case, will send this patient to [facility] in case she declines and  needs urgent surgical intervention.    Will need pain control. Will also likely need hand consult given open fx.     Regina [name] 06/29/2015, 11:51 PM

## 2015-06-29 NOTE — ED Notes (Signed)
Dr. Jeanell Sparrow at the bedside for update

## 2015-06-29 NOTE — ED Notes (Signed)
Xray at the bedside.

## 2015-06-29 NOTE — ED Notes (Signed)
Reported to Dr. Jeanell Sparrow, patient requests medication for headache. md acknowledges.

## 2015-06-29 NOTE — Progress Notes (Signed)
Orthopedic Tech Progress Note Patient Details:  Regina [name] 03-06-1966 [id number]  Patient ID: [name], female   DOB: [birth year], 50 y.o.   MRN: [id number]   [name] 06/29/2015, 7:50 PM Level 2 trauma ortho visit.

## 2015-06-29 NOTE — ED Notes (Signed)
Verified with Network engineer, [place] already enroute

## 2015-06-30 DIAGNOSIS — S62315A Displaced fracture of base of fourth metacarpal bone, left hand, initial encounter for closed fracture: Secondary | ICD-10-CM | POA: Insufficient documentation

## 2015-06-30 DIAGNOSIS — S61412A Laceration without foreign body of left hand, initial encounter: Secondary | ICD-10-CM | POA: Insufficient documentation

## 2015-07-02 ENCOUNTER — Encounter: Payer: Self-pay | Admitting: Family Medicine

## 2015-07-03 ENCOUNTER — Ambulatory Visit: Payer: BC Managed Care – PPO | Admitting: Internal Medicine

## 2015-07-23 DIAGNOSIS — S22000A Wedge compression fracture of unspecified thoracic vertebra, initial encounter for closed fracture: Secondary | ICD-10-CM | POA: Insufficient documentation

## 2015-07-24 ENCOUNTER — Encounter: Payer: Self-pay | Admitting: Internal Medicine

## 2015-07-24 ENCOUNTER — Ambulatory Visit (INDEPENDENT_AMBULATORY_CARE_PROVIDER_SITE_OTHER): Payer: BC Managed Care – PPO | Admitting: Internal Medicine

## 2015-07-24 VITALS — BP 110/70 | HR 82 | Wt 160.0 lb

## 2015-07-24 DIAGNOSIS — W11XXXS Fall on and from ladder, sequela: Secondary | ICD-10-CM

## 2015-07-24 DIAGNOSIS — S12591S Other nondisplaced fracture of sixth cervical vertebra, sequela: Secondary | ICD-10-CM

## 2015-07-24 DIAGNOSIS — S060XAS Concussion with loss of consciousness status unknown, sequela: Secondary | ICD-10-CM

## 2015-07-24 DIAGNOSIS — E8940 Asymptomatic postprocedural ovarian failure: Secondary | ICD-10-CM

## 2015-07-24 DIAGNOSIS — T148 Other injury of unspecified body region: Secondary | ICD-10-CM

## 2015-07-24 DIAGNOSIS — N958 Other specified menopausal and perimenopausal disorders: Secondary | ICD-10-CM | POA: Diagnosis not present

## 2015-07-24 DIAGNOSIS — T148XXA Other injury of unspecified body region, initial encounter: Secondary | ICD-10-CM

## 2015-07-24 DIAGNOSIS — S0291XS Unspecified fracture of skull, sequela: Secondary | ICD-10-CM

## 2015-07-24 DIAGNOSIS — E538 Deficiency of other specified B group vitamins: Secondary | ICD-10-CM

## 2015-07-24 DIAGNOSIS — S22008D Other fracture of unspecified thoracic vertebra, subsequent encounter for fracture with routine healing: Secondary | ICD-10-CM

## 2015-07-24 DIAGNOSIS — S060X9S Concussion with loss of consciousness of unspecified duration, sequela: Secondary | ICD-10-CM

## 2015-07-24 MED ORDER — CYANOCOBALAMIN 1000 MCG/ML IJ SOLN
1000.0000 ug | Freq: Once | INTRAMUSCULAR | Status: AC
  Administered 2015-07-24: 1000 ug via INTRAMUSCULAR

## 2015-07-24 NOTE — Progress Notes (Signed)
Subjective:  Patient ID: Regina [name], female    DOB: [birth year]  Age: 50 y.o. MRN: [id number]  CC: No chief complaint on file.   HPI Regina [name] presents for a post-hosp f/u after a fall from a tree house ladder 30 ft on 06/29/15: "Fall Right epidural hemorrhage and skull fracture T3-T7 TP fx Right metacarpal fx and associated laceration"   Pt currently stable with no neuro deficits.  Given the fact that neurosurgery is starting very long neuro trauma case, will send this patient to [facility] in case she declines and needs urgent surgical intervention.  C/o R hand splinter C/o R wrist pain Pt needs a new GYN doctor  H/o abn PAP w/DR Mezer  Outpatient Prescriptions Prior to Visit  Medication Sig Dispense Refill  . atenolol (TENORMIN) 50 MG tablet Take 1 tablet (50 mg total) by mouth 2 (two) times daily as needed. 30 tablet 3  . Cholecalciferol (GNP VITAMIN D) 1000 UNITS tablet Take 1 tablet (1,000 Units total) by mouth daily. 100 tablet 3  . clobetasol (OLUX) 0.05 % topical foam Apply topically 2 (two) times daily. 50 g 1  . cyanocobalamin (,VITAMIN B-12,) 1000 MCG/ML injection Inject 1 mL (1,000 mcg total) into the skin every 14 (fourteen) days. 10 mL 11  . Cyanocobalamin (VITAMIN B 12 PO) Take 1 tablet by mouth daily.    . Cyanocobalamin (VITAMIN B-12 IJ) Inject 1 Applicatorful as directed every 14 (fourteen) days.    . GuaiFENesin (MUCINEX PO) Take by mouth as directed.    . Probiotic Product (ALIGN) 4 MG CAPS Take 1 capsule by mouth daily. 14 capsule 0  . psyllium (REGULOID) 0.52 G capsule Take 0.52 g by mouth daily.    . SYRINGE-NEEDLE, DISP, 3 ML (BD ECLIPSE SYRINGE) 25G X 1" 3 ML MISC Use IM 50 each 11  . benzonatate (TESSALON) 100 MG capsule Take 1 capsule (100 mg total) by mouth 2 (two) times daily as needed for cough. (Patient not taking: Reported on 07/24/2015) 20 capsule 0  . esomeprazole (NEXIUM) 40 MG capsule Take 1 capsule (40 mg total) by mouth daily.  (Patient not taking: Reported on 07/24/2015) 30 capsule 1  . fluticasone (FLONASE) 50 MCG/ACT nasal spray Place 2 sprays into both nostrils daily. (Patient not taking: Reported on 07/24/2015) 16 g 6  . loratadine (CLARITIN) 10 MG tablet Take 1 tablet (10 mg total) by mouth daily. (Patient not taking: Reported on 07/24/2015) 100 tablet 3  . ondansetron (ZOFRAN-ODT) 4 MG disintegrating tablet Take 1 tablet (4 mg total) by mouth every 8 (eight) hours as needed for nausea or vomiting. (Patient not taking: Reported on 07/24/2015) 20 tablet 0   No facility-administered medications prior to visit.    ROS Review of Systems  Constitutional: Negative for chills, activity change, appetite change, fatigue and unexpected weight change.  HENT: Negative for congestion, mouth sores and sinus pressure.   Eyes: Negative for visual disturbance.  Respiratory: Negative for cough and chest tightness.   Gastrointestinal: Negative for nausea and abdominal pain.  Genitourinary: Negative for frequency, difficulty urinating and vaginal pain.  Musculoskeletal: Positive for back pain, joint swelling, arthralgias, neck pain and neck stiffness. Negative for gait problem.  Skin: Negative for pallor and rash.  Neurological: Negative for dizziness, tremors, weakness, numbness and headaches.  Psychiatric/Behavioral: Negative for confusion and sleep disturbance.    Objective:  BP 110/70 mmHg  Pulse 82  Wt 160 lb (72.576 kg)  SpO2 96%  BP Readings from Last  3 Encounters:  07/24/15 110/70  06/29/15 119/76  06/17/15 110/70    Wt Readings from Last 3 Encounters:  07/24/15 160 lb (72.576 kg)  06/29/15 163 lb (73.936 kg)  06/17/15 163 lb (73.936 kg)    Physical Exam  Constitutional: She appears well-developed. No distress.  HENT:  Head: Normocephalic.  Right Ear: External ear normal.  Left Ear: External ear normal.  Nose: Nose normal.  Mouth/Throat: Oropharynx is clear and moist.  Eyes: Conjunctivae are normal.  Pupils are equal, round, and reactive to light. Right eye exhibits no discharge. Left eye exhibits no discharge.  Neck: Normal range of motion. Neck supple. No JVD present. No tracheal deviation present. No thyromegaly present.  Cardiovascular: Normal rate, regular rhythm and normal heart sounds.   Pulmonary/Chest: No stridor. No respiratory distress. She has no wheezes.  Abdominal: Soft. Bowel sounds are normal. She exhibits no distension and no mass. There is no tenderness. There is no rebound and no guarding.  Musculoskeletal: She exhibits tenderness. She exhibits no edema.  Lymphadenopathy:    She has no cervical adenopathy.  Neurological: She displays normal reflexes. No cranial nerve deficit. She exhibits normal muscle tone. Coordination normal.  Skin: No rash noted. No erythema.  Psychiatric: She has a normal mood and affect. Her behavior is normal. Judgment and thought content normal.  R wrist is tender w/ROM Neck in a hard collar R hand splinter  Procedure: R palm splinter removal Area was cleaned w/betadine and alcohol. 3 mm splinter was disimbeded with a strait blade and extracted with splinter forceps. Bandaid w/abx oint.     Lab Results  Component Value Date   WBC 12.0* 06/29/2015   HGB 12.1 06/29/2015   HCT 35.6* 06/29/2015   PLT 436* 06/29/2015   GLUCOSE 114* 06/29/2015   CHOL 201* 02/26/2015   TRIG 65.0 02/26/2015   HDL 62.10 02/26/2015   LDLCALC 126* 02/26/2015   ALT 19 06/29/2015   AST 28 06/29/2015   NA 139 06/29/2015   K 3.1* 06/29/2015   CL 107 06/29/2015   CREATININE 0.67 06/29/2015   BUN 6 06/29/2015   CO2 23 06/29/2015   TSH 1.73 02/26/2015   INR 1.16 06/29/2015    Ct Head Wo Contrast  06/29/2015  CLINICAL DATA:  Fall 30 feet from roof/ladder. EXAM: CT HEAD WITHOUT CONTRAST CT CERVICAL SPINE WITHOUT CONTRAST TECHNIQUE: Multidetector CT imaging of the head and cervical spine was performed following the standard protocol without intravenous contrast.  Multiplanar CT image reconstructions of the cervical spine were also generated. COMPARISON:  None. FINDINGS: CT HEAD FINDINGS Slightly displaced fracture of the right frontoparietal bone. Underlying acute epidural hemorrhage measuring approximately 9 mm greatest thickness with slight mass effect on the underlying right frontoparietal lobe. No appreciable midline shift. Ventricles are normal in size and configuration. No parenchymal hemorrhage. Soft tissue edema/hematoma overlying the right frontoparietal bone measures up to 1.5 cm thickness. CT CERVICAL SPINE FINDINGS Alignment of the cervical spine is normal. Minimally displaced fracture lines noted within the bilateral posterior lamina of C6. Fracture risk do not involve the more anterior facets or pedicles. No vertebral body fracture at this or any other level of the cervical spine. Paravertebral soft tissues are unremarkable. IMPRESSION: 1. Acute epidural hemorrhage overlying the right frontoparietal lobe, measuring 9 mm greatest thickness with slight mass effect on the underlying frontoparietal lobe parenchyma. No appreciable midline shift. 2. Slightly displaced fracture of the right frontoparietal bone, at the site of the acute epidural hemorrhage described above. Scalp  hematoma overlies this right frontoparietal bone fracture. 3. Minimally displaced, obliquely oriented fracture involving the bilateral posterior lamina of C6. This configuration suggests stable fracture. No fracture extension into the C6 facets or pedicles. No vertebral body fracture at this or any other level. Remainder of the cervical spine appears intact and normally aligned. Critical Value/emergent results were called by telephone at the time of interpretation on 06/29/2015 at 8:52 pm to Dr. Pattricia Boss , who verbally acknowledged these results. Electronically Signed   By: Franki Cabot M.D.   On: 06/29/2015 20:56   Ct Chest W Contrast  06/29/2015  CLINICAL DATA:  Fall approximately 30  feet from ladder. EXAM: CT CHEST, ABDOMEN, AND PELVIS WITH CONTRAST TECHNIQUE: Multidetector CT imaging of the chest, abdomen and pelvis was performed following the standard protocol during bolus administration of intravenous contrast. CONTRAST:  114mL ISOVUE-300 IOPAMIDOL (ISOVUE-300) INJECTION 61% COMPARISON:  Chest and pelvic radiographs of 06/29/2015. FINDINGS: CT CHEST FINDINGS Mediastinum/Lymph Nodes: Normal aorta, without evidence of aortic laceration or mediastinal hematoma. Normal heart size, without pericardial effusion. No mediastinal or hilar adenopathy. Tiny hiatal hernia. Lungs/Pleura: Trace left pleural fluid. No pneumothorax. Mild bibasilar atelectasis. Musculoskeletal: Nondisplaced fracture of the left T3 through T7 transverse processes. CT ABDOMEN PELVIS FINDINGS Hepatobiliary: Normal liver. Normal gallbladder, without biliary ductal dilatation. Pancreas: Normal, without mass or ductal dilatation. Spleen: Normal in size, without focal abnormality. Adrenals/Urinary Tract: Normal adrenal glands. Normal left kidney. Partially duplicated right renal collecting system. Normal urinary bladder. Stomach/Bowel: Normal remainder of the stomach. Scattered colonic diverticula. Normal colon and terminal ileum. Normal small bowel. No pneumatosis or free intraperitoneal air. Vascular/Lymphatic: Normal caliber of the aorta and branch vessels. No abdominopelvic adenopathy. Reproductive: Hysterectomy.  No adnexal mass. Other: No significant free fluid. Musculoskeletal: No acute osseous abnormality. IMPRESSION: 1. Left transverse process nondisplaced fractures at T3 through T7. 2. No other posttraumatic deformity identified. 3. Trace left pleural fluid. Electronically Signed   By: Abigail Miyamoto M.D.   On: 06/29/2015 20:58   Ct Cervical Spine Wo Contrast  06/29/2015  CLINICAL DATA:  Fall 30 feet from roof/ladder. EXAM: CT HEAD WITHOUT CONTRAST CT CERVICAL SPINE WITHOUT CONTRAST TECHNIQUE: Multidetector CT imaging of  the head and cervical spine was performed following the standard protocol without intravenous contrast. Multiplanar CT image reconstructions of the cervical spine were also generated. COMPARISON:  None. FINDINGS: CT HEAD FINDINGS Slightly displaced fracture of the right frontoparietal bone. Underlying acute epidural hemorrhage measuring approximately 9 mm greatest thickness with slight mass effect on the underlying right frontoparietal lobe. No appreciable midline shift. Ventricles are normal in size and configuration. No parenchymal hemorrhage. Soft tissue edema/hematoma overlying the right frontoparietal bone measures up to 1.5 cm thickness. CT CERVICAL SPINE FINDINGS Alignment of the cervical spine is normal. Minimally displaced fracture lines noted within the bilateral posterior lamina of C6. Fracture risk do not involve the more anterior facets or pedicles. No vertebral body fracture at this or any other level of the cervical spine. Paravertebral soft tissues are unremarkable. IMPRESSION: 1. Acute epidural hemorrhage overlying the right frontoparietal lobe, measuring 9 mm greatest thickness with slight mass effect on the underlying frontoparietal lobe parenchyma. No appreciable midline shift. 2. Slightly displaced fracture of the right frontoparietal bone, at the site of the acute epidural hemorrhage described above. Scalp hematoma overlies this right frontoparietal bone fracture. 3. Minimally displaced, obliquely oriented fracture involving the bilateral posterior lamina of C6. This configuration suggests stable fracture. No fracture extension into the C6 facets or pedicles.  No vertebral body fracture at this or any other level. Remainder of the cervical spine appears intact and normally aligned. Critical Value/emergent results were called by telephone at the time of interpretation on 06/29/2015 at 8:52 pm to Dr. Pattricia Boss , who verbally acknowledged these results. Electronically Signed   By: Franki Cabot  M.D.   On: 06/29/2015 20:56   Ct Abdomen Pelvis W Contrast  06/29/2015  CLINICAL DATA:  Fall approximately 30 feet from ladder. EXAM: CT CHEST, ABDOMEN, AND PELVIS WITH CONTRAST TECHNIQUE: Multidetector CT imaging of the chest, abdomen and pelvis was performed following the standard protocol during bolus administration of intravenous contrast. CONTRAST:  166mL ISOVUE-300 IOPAMIDOL (ISOVUE-300) INJECTION 61% COMPARISON:  Chest and pelvic radiographs of 06/29/2015. FINDINGS: CT CHEST FINDINGS Mediastinum/Lymph Nodes: Normal aorta, without evidence of aortic laceration or mediastinal hematoma. Normal heart size, without pericardial effusion. No mediastinal or hilar adenopathy. Tiny hiatal hernia. Lungs/Pleura: Trace left pleural fluid. No pneumothorax. Mild bibasilar atelectasis. Musculoskeletal: Nondisplaced fracture of the left T3 through T7 transverse processes. CT ABDOMEN PELVIS FINDINGS Hepatobiliary: Normal liver. Normal gallbladder, without biliary ductal dilatation. Pancreas: Normal, without mass or ductal dilatation. Spleen: Normal in size, without focal abnormality. Adrenals/Urinary Tract: Normal adrenal glands. Normal left kidney. Partially duplicated right renal collecting system. Normal urinary bladder. Stomach/Bowel: Normal remainder of the stomach. Scattered colonic diverticula. Normal colon and terminal ileum. Normal small bowel. No pneumatosis or free intraperitoneal air. Vascular/Lymphatic: Normal caliber of the aorta and branch vessels. No abdominopelvic adenopathy. Reproductive: Hysterectomy.  No adnexal mass. Other: No significant free fluid. Musculoskeletal: No acute osseous abnormality. IMPRESSION: 1. Left transverse process nondisplaced fractures at T3 through T7. 2. No other posttraumatic deformity identified. 3. Trace left pleural fluid. Electronically Signed   By: Abigail Miyamoto M.D.   On: 06/29/2015 20:58   Dg Pelvis Portable  06/29/2015  CLINICAL DATA:  Fall from roof. EXAM: PORTABLE  PELVIS 1-2 VIEWS COMPARISON:  None. FINDINGS: There is no evidence of pelvic fracture or diastasis. No pelvic bone lesions are seen. Soft tissues about the pelvis and lower abdomen are unremarkable. IMPRESSION: Negative. Electronically Signed   By: Franki Cabot M.D.   On: 06/29/2015 19:29   Dg Chest Portable 1 View  06/29/2015  CLINICAL DATA:  Fall from roof, history of atrial fibrillation. EXAM: PORTABLE CHEST 1 VIEW COMPARISON:  None. FINDINGS: The heart size and mediastinal contours are within normal limits. Both lungs are clear, given the oblique patient positioning. No osseous fracture or dislocation seen. IMPRESSION: No acute findings. Electronically Signed   By: Franki Cabot M.D.   On: 06/29/2015 19:28   Dg Hand Complete Right  06/29/2015  CLINICAL DATA:  Initial encounter for Fall from building, about 2 stories, right hand laceration- anterior hand at 5th metacarpal. EXAM: RIGHT HAND - COMPLETE 3+ VIEW COMPARISON:  None. FINDINGS: Overlap of fingers on the lateral. Possible nondisplaced fracture at the fifth metacarpal neck, most apparent on the oblique image. No growth plate extension. IMPRESSION: Possible nondisplaced fracture involving the fifth metacarpal neck. Correlate with point tenderness. Electronically Signed   By: Abigail Miyamoto M.D.   On: 06/29/2015 21:18    Assessment & Plan:   There are no diagnoses linked to this encounter. I am having Ms. Youngren maintain her ALIGN, psyllium, Cholecalciferol, atenolol, SYRINGE-NEEDLE (DISP) 3 ML, cyanocobalamin, clobetasol, fluticasone, loratadine, GuaiFENesin (MUCINEX PO), esomeprazole, ondansetron, benzonatate, butalbital-acetaminophen-caffeine, traMADol, Cyanocobalamin (VITAMIN B 12 PO), and Cyanocobalamin (VITAMIN B-12 IJ).  Meds ordered this encounter  Medications  . butalbital-acetaminophen-caffeine (  FIORICET, ESGIC) 50-325-40 MG tablet    Sig: as needed. Reported on 07/24/2015    Refill:  0  . traMADol (ULTRAM) 50 MG tablet    Sig: as  needed. Reported on 07/24/2015    Refill:  0     Follow-up: No Follow-up on file.  Walker Kehr, MD

## 2015-07-24 NOTE — Progress Notes (Signed)
Pre visit review using our clinic review tool, if applicable. No additional management support is needed unless otherwise documented below in the visit note. 

## 2015-07-30 ENCOUNTER — Encounter: Payer: Self-pay | Admitting: Internal Medicine

## 2015-07-30 DIAGNOSIS — W11XXXA Fall on and from ladder, initial encounter: Secondary | ICD-10-CM | POA: Insufficient documentation

## 2015-07-30 DIAGNOSIS — T148XXA Other injury of unspecified body region, initial encounter: Secondary | ICD-10-CM | POA: Insufficient documentation

## 2015-07-30 DIAGNOSIS — S22009A Unspecified fracture of unspecified thoracic vertebra, initial encounter for closed fracture: Secondary | ICD-10-CM | POA: Insufficient documentation

## 2015-07-30 DIAGNOSIS — S12500A Unspecified displaced fracture of sixth cervical vertebra, initial encounter for closed fracture: Secondary | ICD-10-CM | POA: Insufficient documentation

## 2015-07-30 DIAGNOSIS — S060X9A Concussion with loss of consciousness of unspecified duration, initial encounter: Secondary | ICD-10-CM

## 2015-07-30 DIAGNOSIS — S060XAA Concussion with loss of consciousness status unknown, initial encounter: Secondary | ICD-10-CM | POA: Insufficient documentation

## 2015-07-30 DIAGNOSIS — S0291XA Unspecified fracture of skull, initial encounter for closed fracture: Secondary | ICD-10-CM | POA: Insufficient documentation

## 2015-07-30 NOTE — Assessment & Plan Note (Signed)
w/epidural hematoma R Discussed

## 2015-07-30 NOTE — Assessment & Plan Note (Signed)
06/29/15  Left transverse process nondisplaced fractures at T3 through T7. Recovering

## 2015-07-30 NOTE — Assessment & Plan Note (Addendum)
a fall from a tree house ladder 30 ft on 06/29/15:  Right epidural hemorrhage and skull fracture  Left transverse process nondisplaced fractures at T3 through T7. Right metacarpal fx and associated laceration  A complex data from [facility] and [facility] was reviewed

## 2015-07-30 NOTE — Assessment & Plan Note (Signed)
Will remove

## 2015-08-05 ENCOUNTER — Encounter: Payer: Self-pay | Admitting: Obstetrics and Gynecology

## 2015-08-05 ENCOUNTER — Ambulatory Visit (INDEPENDENT_AMBULATORY_CARE_PROVIDER_SITE_OTHER): Payer: BC Managed Care – PPO | Admitting: Obstetrics and Gynecology

## 2015-08-05 VITALS — BP 100/76 | HR 64 | Resp 15 | Ht 64.0 in | Wt 160.0 lb

## 2015-08-05 DIAGNOSIS — R896 Abnormal cytological findings in specimens from other organs, systems and tissues: Secondary | ICD-10-CM

## 2015-08-05 DIAGNOSIS — R87629 Unspecified abnormal cytological findings in specimens from vagina: Secondary | ICD-10-CM

## 2015-08-05 NOTE — Progress Notes (Signed)
Patient ID: Regina [name], female   DOB: [birth year], 50 y.o.   MRN: [id number] 50 y.o. [id number] MarriedCaucasianF here for follow up PAP. Patients last PAP in 11/2014 was abnormal. Her former GYN has retired. She has a h/o a hysterectomy in 2005, due for an annual in 9/17. She does have a h/o abnormal paps prior to her hysterectomy, unsure about treatment. She thinks this was her first abnormal pap since her hysterectomy. The patient fell 15-20 feet last month and has vertebral fractures in a brace for another 6 weeks, no surgery. She is doing well    No LMP recorded. Patient has had a hysterectomy.          Sexually active: Yes.    The current method of family planning is status post hysterectomy.    Exercising: Yes.    walking Smoker:  no  Health Maintenance: Pap:  11/2014 abnormal History of abnormal Pap:  yes MMG:  2016 WNL per patient Colonoscopy:  2013 polyps removed  BMD:   2015 WNL per patient  TDaP:  07-05-15 Gardasil: N/A   reports that she has never smoked. She has never used smokeless tobacco. She reports that she drinks about 0.6 - 1.2 oz of alcohol per week. She reports that she does not use illicit drugs. 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   Past Medical History  Diagnosis Date  . B12 DEFICIENCY   . Rosacea   . Allergic urticaria   . ARTHRALGIA   . Osteoarthritis   . Gastritis   . GERD   . CFS (chronic fatigue syndrome)   . Internal hemorrhoids   . MVP (mitral valve prolapse)   . History of endometriosis   . Atrial fibrillation ([place])   . Arthritis   . IBS (irritable bowel syndrome)   . Fibromyalgia   . Epidural hemorrhage with loss of consciousness ([place]) 06/29/15  . Thoracic compression fracture ([place]) 06/29/15    T2-T6  . Fracture of cervical vertebra, C6 ([place]) 06/29/15  . Endometriosis   . Fibroid   . Heart murmur     Past Surgical History  Procedure Laterality Date  . Appendectomy    . Abdominal hysterectomy      partial  . Ovarian cyst  removal    . Uterine fibroid surgery    . Hemorrhoid surgery      Dr. Rise [name] 2009  . Anal fissure repair      Dr Rise [name] 2009  . Tubal ligation    . Rt bunionectomt    . Tonsillectomy and adenoidectomy    . Abdominal hysterectomy    . Colposcopy      Current Outpatient Prescriptions  Medication Sig Dispense Refill  . CASCARA SAGRADA PO Take by mouth.    . Cholecalciferol (GNP VITAMIN D) 1000 UNITS tablet Take 1 tablet (1,000 Units total) by mouth daily. 100 tablet 3  . cyanocobalamin (,VITAMIN B-12,) 1000 MCG/ML injection Inject 1 mL (1,000 mcg total) into the skin every 14 (fourteen) days. 10 mL 11  . Cyanocobalamin (VITAMIN B 12 PO) Take 1 tablet by mouth daily.    . SYRINGE-NEEDLE, DISP, 3 ML (BD ECLIPSE SYRINGE) 25G X 1" 3 ML MISC Use IM 50 each 11   No current facility-administered medications for this visit.    Family History  Problem Relation Age of Onset  . Asthma Neg Hx   . Diabetes      uncles/aunts  . Lung cancer Mother   . Thyroid  cancer Mother   . Cancer Mother     INTESTINAL  . Healthy Father   . Von Willebrand disease Daughter     Review of Systems  Constitutional: Negative.   HENT: Negative.   Eyes: Negative.   Respiratory: Negative.   Cardiovascular: Negative.   Gastrointestinal: Negative.   Endocrine: Negative.   Genitourinary: Negative.   Musculoskeletal: Negative.   Skin: Negative.   Allergic/Immunologic: Negative.   Neurological: Negative.   Psychiatric/Behavioral: Negative.     Exam:   BP 100/76 mmHg  Pulse 64  Resp 15  Ht 5\' 4"  (1.626 m)  Wt 160 lb (72.576 kg)  BMI 27.45 kg/m2  Weight change: @WEIGHTCHANGE @ Height:   Height: 5\' 4"  (162.6 cm)  Ht Readings from Last 3 Encounters:  08/05/15 5\' 4"  (1.626 m)  06/29/15 5\' 4"  (1.626 m)  09/07/14 5' 4.5" (1.638 m)    General appearance: alert, cooperative and appears stated age   Pelvic: External genitalia:  no lesions              Urethra:  normal appearing urethra with no  masses, tenderness or lesions              Bartholins and Skenes: normal                 Vagina: normal appearing vagina with normal color and discharge, no lesions              Cervix is absent  Chaperone was present for exam.  A:  H/O abnormal pap  P:   Get a copy of her last pap and gyn records  F/U for an annual exam in 6 months

## 2015-08-07 ENCOUNTER — Ambulatory Visit (INDEPENDENT_AMBULATORY_CARE_PROVIDER_SITE_OTHER): Payer: BC Managed Care – PPO | Admitting: Internal Medicine

## 2015-08-07 ENCOUNTER — Encounter: Payer: Self-pay | Admitting: Internal Medicine

## 2015-08-07 VITALS — BP 90/60 | HR 68 | Wt 161.0 lb

## 2015-08-07 DIAGNOSIS — T148XXA Other injury of unspecified body region, initial encounter: Secondary | ICD-10-CM

## 2015-08-07 DIAGNOSIS — T148 Other injury of unspecified body region: Secondary | ICD-10-CM | POA: Diagnosis not present

## 2015-08-07 DIAGNOSIS — M25531 Pain in right wrist: Secondary | ICD-10-CM | POA: Insufficient documentation

## 2015-08-07 LAB — IPS PAP TEST WITH HPV

## 2015-08-07 NOTE — Assessment & Plan Note (Signed)
Another splinter was removed today; granuloma destroyed with a silver nitrate stick

## 2015-08-07 NOTE — Progress Notes (Signed)
Subjective:  Patient ID: Regina [name], female    DOB: 1965/09/01  Age: 50 y.o. MRN: [id number]  CC: No chief complaint on file.   HPI CRYSTALMARIE DOMM presents for a splinter in the   Outpatient Prescriptions Prior to Visit  Medication Sig Dispense Refill  . CASCARA SAGRADA PO Take by mouth.    . Cholecalciferol (GNP VITAMIN D) 1000 UNITS tablet Take 1 tablet (1,000 Units total) by mouth daily. 100 tablet 3  . cyanocobalamin (,VITAMIN B-12,) 1000 MCG/ML injection Inject 1 mL (1,000 mcg total) into the skin every 14 (fourteen) days. 10 mL 11  . Cyanocobalamin (VITAMIN B 12 PO) Take 1 tablet by mouth daily.    . SYRINGE-NEEDLE, DISP, 3 ML (BD ECLIPSE SYRINGE) 25G X 1" 3 ML MISC Use IM 50 each 11   No facility-administered medications prior to visit.    ROS Review of Systems  Constitutional: Negative for chills, activity change, appetite change, fatigue and unexpected weight change.  HENT: Negative for congestion, mouth sores and sinus pressure.   Eyes: Negative for visual disturbance.  Respiratory: Negative for cough and chest tightness.   Gastrointestinal: Negative for nausea and abdominal pain.  Genitourinary: Negative for frequency, difficulty urinating and vaginal pain.  Musculoskeletal: Positive for back pain, arthralgias, neck pain and neck stiffness. Negative for gait problem.  Skin: Positive for wound. Negative for pallor and rash.  Neurological: Negative for dizziness, tremors, weakness, numbness and headaches.  Psychiatric/Behavioral: Negative for confusion and sleep disturbance.    Objective:  BP 90/60 mmHg  Pulse 68  Wt 161 lb (73.029 kg)  SpO2 94%  BP Readings from Last 3 Encounters:  08/07/15 90/60  08/05/15 100/76  07/24/15 110/70    Wt Readings from Last 3 Encounters:  08/07/15 161 lb (73.029 kg)  08/05/15 160 lb (72.576 kg)  07/24/15 160 lb (72.576 kg)    Physical Exam  Constitutional: She appears well-developed. No distress.  HENT:  Head:  Normocephalic.  Right Ear: External ear normal.  Left Ear: External ear normal.  Nose: Nose normal.  Mouth/Throat: Oropharynx is clear and moist.  Eyes: Conjunctivae are normal. Pupils are equal, round, and reactive to light. Right eye exhibits no discharge. Left eye exhibits no discharge.  Neck: Normal range of motion. Neck supple. No JVD present. No tracheal deviation present. No thyromegaly present.  Cardiovascular: Normal rate, regular rhythm and normal heart sounds.   Pulmonary/Chest: No stridor. No respiratory distress. She has no wheezes.  Abdominal: Soft. Bowel sounds are normal. She exhibits no distension and no mass. There is no tenderness. There is no rebound and no guarding.  Musculoskeletal: She exhibits tenderness. She exhibits no edema.  Lymphadenopathy:    She has no cervical adenopathy.  Neurological: She displays normal reflexes. No cranial nerve deficit. She exhibits normal muscle tone. Coordination normal.  Skin: No rash noted. No erythema.  Psychiatric: She has a normal mood and affect. Her behavior is normal. Judgment and thought content normal.  Neck brace R wrist is tender w/ROM  R palm 3 mm abscess under the scab Procedure: Another 1 mm splinter was removed today w/a blade; 3 mm granuloma destroyed with a silver nitrate stick   Lab Results  Component Value Date   WBC 12.0* 06/29/2015   HGB 12.1 06/29/2015   HCT 35.6* 06/29/2015   PLT 436* 06/29/2015   GLUCOSE 114* 06/29/2015   CHOL 201* 02/26/2015   TRIG 65.0 02/26/2015   HDL 62.10 02/26/2015   LDLCALC 126* 02/26/2015  ALT 19 06/29/2015   AST 28 06/29/2015   NA 139 06/29/2015   K 3.1* 06/29/2015   CL 107 06/29/2015   CREATININE 0.67 06/29/2015   BUN 6 06/29/2015   CO2 23 06/29/2015   TSH 1.73 02/26/2015   INR 1.16 06/29/2015    Ct Head Wo Contrast  06/29/2015  CLINICAL DATA:  Fall 30 feet from roof/ladder. EXAM: CT HEAD WITHOUT CONTRAST CT CERVICAL SPINE WITHOUT CONTRAST TECHNIQUE: Multidetector  CT imaging of the head and cervical spine was performed following the standard protocol without intravenous contrast. Multiplanar CT image reconstructions of the cervical spine were also generated. COMPARISON:  None. FINDINGS: CT HEAD FINDINGS Slightly displaced fracture of the right frontoparietal bone. Underlying acute epidural hemorrhage measuring approximately 9 mm greatest thickness with slight mass effect on the underlying right frontoparietal lobe. No appreciable midline shift. Ventricles are normal in size and configuration. No parenchymal hemorrhage. Soft tissue edema/hematoma overlying the right frontoparietal bone measures up to 1.5 cm thickness. CT CERVICAL SPINE FINDINGS Alignment of the cervical spine is normal. Minimally displaced fracture lines noted within the bilateral posterior lamina of C6. Fracture risk do not involve the more anterior facets or pedicles. No vertebral body fracture at this or any other level of the cervical spine. Paravertebral soft tissues are unremarkable. IMPRESSION: 1. Acute epidural hemorrhage overlying the right frontoparietal lobe, measuring 9 mm greatest thickness with slight mass effect on the underlying frontoparietal lobe parenchyma. No appreciable midline shift. 2. Slightly displaced fracture of the right frontoparietal bone, at the site of the acute epidural hemorrhage described above. Scalp hematoma overlies this right frontoparietal bone fracture. 3. Minimally displaced, obliquely oriented fracture involving the bilateral posterior lamina of C6. This configuration suggests stable fracture. No fracture extension into the C6 facets or pedicles. No vertebral body fracture at this or any other level. Remainder of the cervical spine appears intact and normally aligned. Critical Value/emergent results were called by telephone at the time of interpretation on 06/29/2015 at 8:52 pm to Dr. Pattricia Boss , who verbally acknowledged these results. Electronically Signed   By:  Franki Cabot M.D.   On: 06/29/2015 20:56   Ct Chest W Contrast  06/29/2015  CLINICAL DATA:  Fall approximately 30 feet from ladder. EXAM: CT CHEST, ABDOMEN, AND PELVIS WITH CONTRAST TECHNIQUE: Multidetector CT imaging of the chest, abdomen and pelvis was performed following the standard protocol during bolus administration of intravenous contrast. CONTRAST:  190mL ISOVUE-300 IOPAMIDOL (ISOVUE-300) INJECTION 61% COMPARISON:  Chest and pelvic radiographs of 06/29/2015. FINDINGS: CT CHEST FINDINGS Mediastinum/Lymph Nodes: Normal aorta, without evidence of aortic laceration or mediastinal hematoma. Normal heart size, without pericardial effusion. No mediastinal or hilar adenopathy. Tiny hiatal hernia. Lungs/Pleura: Trace left pleural fluid. No pneumothorax. Mild bibasilar atelectasis. Musculoskeletal: Nondisplaced fracture of the left T3 through T7 transverse processes. CT ABDOMEN PELVIS FINDINGS Hepatobiliary: Normal liver. Normal gallbladder, without biliary ductal dilatation. Pancreas: Normal, without mass or ductal dilatation. Spleen: Normal in size, without focal abnormality. Adrenals/Urinary Tract: Normal adrenal glands. Normal left kidney. Partially duplicated right renal collecting system. Normal urinary bladder. Stomach/Bowel: Normal remainder of the stomach. Scattered colonic diverticula. Normal colon and terminal ileum. Normal small bowel. No pneumatosis or free intraperitoneal air. Vascular/Lymphatic: Normal caliber of the aorta and branch vessels. No abdominopelvic adenopathy. Reproductive: Hysterectomy.  No adnexal mass. Other: No significant free fluid. Musculoskeletal: No acute osseous abnormality. IMPRESSION: 1. Left transverse process nondisplaced fractures at T3 through T7. 2. No other posttraumatic deformity identified. 3. Trace left pleural fluid.  Electronically Signed   By: Abigail Miyamoto M.D.   On: 06/29/2015 20:58   Ct Cervical Spine Wo Contrast  06/29/2015  CLINICAL DATA:  Fall 30 feet from  roof/ladder. EXAM: CT HEAD WITHOUT CONTRAST CT CERVICAL SPINE WITHOUT CONTRAST TECHNIQUE: Multidetector CT imaging of the head and cervical spine was performed following the standard protocol without intravenous contrast. Multiplanar CT image reconstructions of the cervical spine were also generated. COMPARISON:  None. FINDINGS: CT HEAD FINDINGS Slightly displaced fracture of the right frontoparietal bone. Underlying acute epidural hemorrhage measuring approximately 9 mm greatest thickness with slight mass effect on the underlying right frontoparietal lobe. No appreciable midline shift. Ventricles are normal in size and configuration. No parenchymal hemorrhage. Soft tissue edema/hematoma overlying the right frontoparietal bone measures up to 1.5 cm thickness. CT CERVICAL SPINE FINDINGS Alignment of the cervical spine is normal. Minimally displaced fracture lines noted within the bilateral posterior lamina of C6. Fracture risk do not involve the more anterior facets or pedicles. No vertebral body fracture at this or any other level of the cervical spine. Paravertebral soft tissues are unremarkable. IMPRESSION: 1. Acute epidural hemorrhage overlying the right frontoparietal lobe, measuring 9 mm greatest thickness with slight mass effect on the underlying frontoparietal lobe parenchyma. No appreciable midline shift. 2. Slightly displaced fracture of the right frontoparietal bone, at the site of the acute epidural hemorrhage described above. Scalp hematoma overlies this right frontoparietal bone fracture. 3. Minimally displaced, obliquely oriented fracture involving the bilateral posterior lamina of C6. This configuration suggests stable fracture. No fracture extension into the C6 facets or pedicles. No vertebral body fracture at this or any other level. Remainder of the cervical spine appears intact and normally aligned. Critical Value/emergent results were called by telephone at the time of interpretation on 06/29/2015  at 8:52 pm to Dr. Pattricia Boss , who verbally acknowledged these results. Electronically Signed   By: Franki Cabot M.D.   On: 06/29/2015 20:56   Ct Abdomen Pelvis W Contrast  06/29/2015  CLINICAL DATA:  Fall approximately 30 feet from ladder. EXAM: CT CHEST, ABDOMEN, AND PELVIS WITH CONTRAST TECHNIQUE: Multidetector CT imaging of the chest, abdomen and pelvis was performed following the standard protocol during bolus administration of intravenous contrast. CONTRAST:  159mL ISOVUE-300 IOPAMIDOL (ISOVUE-300) INJECTION 61% COMPARISON:  Chest and pelvic radiographs of 06/29/2015. FINDINGS: CT CHEST FINDINGS Mediastinum/Lymph Nodes: Normal aorta, without evidence of aortic laceration or mediastinal hematoma. Normal heart size, without pericardial effusion. No mediastinal or hilar adenopathy. Tiny hiatal hernia. Lungs/Pleura: Trace left pleural fluid. No pneumothorax. Mild bibasilar atelectasis. Musculoskeletal: Nondisplaced fracture of the left T3 through T7 transverse processes. CT ABDOMEN PELVIS FINDINGS Hepatobiliary: Normal liver. Normal gallbladder, without biliary ductal dilatation. Pancreas: Normal, without mass or ductal dilatation. Spleen: Normal in size, without focal abnormality. Adrenals/Urinary Tract: Normal adrenal glands. Normal left kidney. Partially duplicated right renal collecting system. Normal urinary bladder. Stomach/Bowel: Normal remainder of the stomach. Scattered colonic diverticula. Normal colon and terminal ileum. Normal small bowel. No pneumatosis or free intraperitoneal air. Vascular/Lymphatic: Normal caliber of the aorta and branch vessels. No abdominopelvic adenopathy. Reproductive: Hysterectomy.  No adnexal mass. Other: No significant free fluid. Musculoskeletal: No acute osseous abnormality. IMPRESSION: 1. Left transverse process nondisplaced fractures at T3 through T7. 2. No other posttraumatic deformity identified. 3. Trace left pleural fluid. Electronically Signed   By: Abigail Miyamoto  M.D.   On: 06/29/2015 20:58   Dg Pelvis Portable  06/29/2015  CLINICAL DATA:  Fall from roof. EXAM: PORTABLE PELVIS 1-2  VIEWS COMPARISON:  None. FINDINGS: There is no evidence of pelvic fracture or diastasis. No pelvic bone lesions are seen. Soft tissues about the pelvis and lower abdomen are unremarkable. IMPRESSION: Negative. Electronically Signed   By: Franki Cabot M.D.   On: 06/29/2015 19:29   Dg Chest Portable 1 View  06/29/2015  CLINICAL DATA:  Fall from roof, history of atrial fibrillation. EXAM: PORTABLE CHEST 1 VIEW COMPARISON:  None. FINDINGS: The heart size and mediastinal contours are within normal limits. Both lungs are clear, given the oblique patient positioning. No osseous fracture or dislocation seen. IMPRESSION: No acute findings. Electronically Signed   By: Franki Cabot M.D.   On: 06/29/2015 19:28   Dg Hand Complete Right  06/29/2015  CLINICAL DATA:  Initial encounter for Fall from building, about 2 stories, right hand laceration- anterior hand at 5th metacarpal. EXAM: RIGHT HAND - COMPLETE 3+ VIEW COMPARISON:  None. FINDINGS: Overlap of fingers on the lateral. Possible nondisplaced fracture at the fifth metacarpal neck, most apparent on the oblique image. No growth plate extension. IMPRESSION: Possible nondisplaced fracture involving the fifth metacarpal neck. Correlate with point tenderness. Electronically Signed   By: Abigail Miyamoto M.D.   On: 06/29/2015 21:18    Assessment & Plan:   There are no diagnoses linked to this encounter. I am having Ms. Braithwaite maintain her Cholecalciferol, SYRINGE-NEEDLE (DISP) 3 ML, cyanocobalamin, CASCARA SAGRADA PO, and Cyanocobalamin (VITAMIN B 12 PO).  No orders of the defined types were placed in this encounter.     Follow-up: No Follow-up on file.  Walker Kehr, MD

## 2015-08-07 NOTE — Assessment & Plan Note (Signed)
Ongoing pain Sports Med ref - Dr Tamala Julian Splint

## 2015-08-07 NOTE — Progress Notes (Signed)
Pre visit review using our clinic review tool, if applicable. No additional management support is needed unless otherwise documented below in the visit note. 

## 2015-08-12 ENCOUNTER — Telehealth: Payer: Self-pay | Admitting: Emergency Medicine

## 2015-08-12 DIAGNOSIS — R87622 Low grade squamous intraepithelial lesion on cytologic smear of vagina (LGSIL): Secondary | ICD-10-CM

## 2015-08-12 NOTE — Telephone Encounter (Signed)
Call to patient with message from Dr. Talbert Nan. Advised of results of LSIL and positive HPV Results. Verbalized understanding of results.  Patient states she has had a colposcopy previously but feels it was many years ago.  Agreeable to scheduling colposcopy at this time. Scheduled for 08/22/15 at 1300.   Pre-procedure instructions given. Motrin 800 mg PO one hour before appointment with food and water.   Order placed for pre-certification and patient aware she will be contacted with benefits coverage information. cc Lerry Liner for insurance pre-certification, referral processing and patient contact.   Routing to provider for final review. Patient agreeable to disposition. Will close encounter.

## 2015-08-12 NOTE — Telephone Encounter (Signed)
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 I'm still awaiting her records from her last GYN. I don't think she had a colposcopy in the fall with her last MD. As long as she didn't just have a colposcopy in the fall, I would set her up for a colposcopy now.

## 2015-08-22 ENCOUNTER — Encounter: Payer: Self-pay | Admitting: Obstetrics and Gynecology

## 2015-08-22 ENCOUNTER — Ambulatory Visit (INDEPENDENT_AMBULATORY_CARE_PROVIDER_SITE_OTHER): Payer: BC Managed Care – PPO | Admitting: Obstetrics and Gynecology

## 2015-08-22 VITALS — BP 110/60 | HR 76 | Resp 14 | Wt 160.0 lb

## 2015-08-22 DIAGNOSIS — R888 Abnormal findings in other body fluids and substances: Secondary | ICD-10-CM

## 2015-08-22 DIAGNOSIS — B977 Papillomavirus as the cause of diseases classified elsewhere: Secondary | ICD-10-CM | POA: Diagnosis not present

## 2015-08-22 DIAGNOSIS — R87622 Low grade squamous intraepithelial lesion on cytologic smear of vagina (LGSIL): Secondary | ICD-10-CM | POA: Diagnosis not present

## 2015-08-22 DIAGNOSIS — IMO0002 Reserved for concepts with insufficient information to code with codable children: Secondary | ICD-10-CM

## 2015-08-22 NOTE — Patient Instructions (Signed)

## 2015-08-22 NOTE — Progress Notes (Signed)
Patient ID: Regina [name], female   DOB: 09-10-1965, 50 y.o.   MRN: [id number] GYNECOLOGY  VISIT   HPI: 50 y.o.   Married  Caucasian  female   [phone] with No LMP recorded. Patient has had a hysterectomy.   here for colposcopy of her vagina. Pap from vaginal cuff returned with LSIL, +HPV. She has a distant h/o abnormal paps of her cervix, s/p hysterectomy. She is newly married since August. Sexually active with her husband for the last year. She has questions about her husbands humara and how much could be getting in her from his semen. She wonders if this could be contributing to her fibromyalgia symptoms. Because of her neck fracture she hasn't been sexually active for over a month, her symptoms haven't changed. Given the half life of 2 weeks, I doubt she could be having effects from his humara. Recommended they speak more to his provider who can contact the humara rep.   GYNECOLOGIC HISTORY: No LMP recorded. Patient has had a hysterectomy. Contraception:hysterectomy  Menopausal hormone therapy: none        OB History    Gravida Para Term Preterm AB TAB SAB Ectopic Multiple Living   3 2 2  0 1 0 1 0  2         Patient Active Problem List   Diagnosis Date Noted  . Wrist pain, right 08/07/2015  . Splinter in skin 07/30/2015  . Fall from ladder 07/30/2015  . Skull fracture with concussion ([place]) 07/30/2015  . Fracture of cervical vertebra, C6 ([place]) 07/30/2015  . Thoracic vertebral fracture ([place]) 07/30/2015  . Abdominal pain, epigastric 05/07/2015  . Scaly patch rash 02/26/2015  . Mitral valve regurgitation 09/27/2014  . PAT (paroxysmal atrial tachycardia) ([place]) 09/06/2014  . Tachycardia 07/31/2014  . Sprain of ankle 06/29/2014  . Earache, left 05/15/2013  . Dizziness 05/15/2013  . Asthmatic bronchitis 05/08/2013  . Benign paroxysmal positional vertigo 05/08/2013  . Chest wall pain 01/26/2013  . LUQ abdominal pain 01/20/2013  . Insomnia 01/10/2013  . Depression (emotion)  01/10/2013  . URI, acute 08/25/2012  . Snoring 08/25/2012  . IBS (irritable bowel syndrome) 02/08/2012  . Multiple pulmonary nodules 02/08/2012  . Chest heaviness 11/16/2010  . GERD 05/24/2010  . SINUSITIS, MAXILLARY, CHRONIC 05/20/2010  . Myalgia and myositis 11/20/2009  . Fatigue 11/20/2009  . ARTHRALGIA 06/26/2009  . Headache(784.0) 06/26/2009  . TOBACCO USE, QUIT 06/26/2009  . LOW BACK PAIN 02/20/2009  . Rosacea 12/27/2008  . PRURITUS 12/27/2008  . Allergic urticaria 12/27/2008  . B12 deficiency 01/02/2008    Past Medical History  Diagnosis Date  . B12 DEFICIENCY   . Rosacea   . Allergic urticaria   . ARTHRALGIA   . Osteoarthritis   . Gastritis   . GERD   . CFS (chronic fatigue syndrome)   . Internal hemorrhoids   . MVP (mitral valve prolapse)   . History of endometriosis   . Atrial fibrillation ([place])   . Arthritis   . IBS (irritable bowel syndrome)   . Fibromyalgia   . Epidural hemorrhage with loss of consciousness ([place]) 06/29/15  . Thoracic compression fracture ([place]) 06/29/15    T2-T6  . Fracture of cervical vertebra, C6 ([place]) 06/29/15  . Endometriosis   . Fibroid   . Heart murmur     Past Surgical History  Procedure Laterality Date  . Appendectomy    . Abdominal hysterectomy      partial  . Ovarian cyst removal    .  Uterine fibroid surgery    . Hemorrhoid surgery      Dr. Rise [name] 2009  . Anal fissure repair      Dr Rise [name] 2009  . Tubal ligation    . Rt bunionectomt    . Tonsillectomy and adenoidectomy    . Abdominal hysterectomy    . Colposcopy      Current Outpatient Prescriptions  Medication Sig Dispense Refill  . CASCARA SAGRADA PO Take by mouth.    . Cholecalciferol (GNP VITAMIN D) 1000 UNITS tablet Take 1 tablet (1,000 Units total) by mouth daily. 100 tablet 3  . cyanocobalamin (,VITAMIN B-12,) 1000 MCG/ML injection Inject 1 mL (1,000 mcg total) into the skin every 14 (fourteen) days. 10 mL 11  . Cyanocobalamin (VITAMIN B 12 PO) Take 1  tablet by mouth daily.    . SYRINGE-NEEDLE, DISP, 3 ML (BD ECLIPSE SYRINGE) 25G X 1" 3 ML MISC Use IM 50 each 11   No current facility-administered medications for this visit.     ALLERGIES: Indocin; Morphine sulfate; Sulfacetamide sodium; Caffeine; Morphine and related; Sulfa antibiotics; Azithromycin; and Erythromycin stearate  Family History  Problem Relation Age of Onset  . Asthma Neg Hx   . Diabetes      uncles/aunts  . Lung cancer Mother   . Thyroid cancer Mother   . Cancer Mother     INTESTINAL  . Healthy Father   . Von Willebrand disease Daughter     Social History   Social History  . Marital Status: Married    Spouse Name: N/A  . Number of Children: N/A  . Years of Education: N/A   Occupational History  . Not on file.   Social History Main Topics  . Smoking status: Never Smoker   . Smokeless tobacco: Never Used  . Alcohol Use: 0.6 - 1.2 oz/week    1-2 Standard drinks or equivalent per week     Comment: occasional   . Drug Use: No  . Sexual Activity:    Partners: Male   Other Topics Concern  . Not on file   Social History Narrative   ** Merged History Encounter **       Working 2 jobs, 6d/wk      Regular exercise-no      Divorced    Review of Systems  Constitutional: Negative.   HENT: Negative.   Eyes: Negative.   Respiratory: Negative.   Cardiovascular: Negative.   Gastrointestinal: Negative.   Genitourinary: Negative.   Musculoskeletal: Negative.   Skin: Negative.   Neurological: Negative.   Endo/Heme/Allergies: Negative.   Psychiatric/Behavioral: Negative.     PHYSICAL EXAMINATION:    BP 110/60 mmHg  Pulse 76  Resp 14  Wt 160 lb (72.576 kg)    General appearance: alert, cooperative and appears stated age  Pelvic: External genitalia:  no lesions              Urethra:  normal appearing urethra with no masses, tenderness or lesions              Bartholins and Skenes: normal                 Vagina: normal appearing vagina with  normal color and discharge, no lesions              Cervix: absent   Colposcopy of the vagina: no acetowhite changes. Decreased lugols uptake on the vaginal cuff on the right side and the left side. Both areas were small and  removed with biopsy. A more mild diffuse decreased uptake of the lugols posteriorly, posterior vaginal biopsy done.   Chaperone was present for exam.  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  Calcium intake, patient had questions about calcium supplementation    PLAN Discussed hpv and abnormal paps with the patient and her husband. Discussed that the risks to her husband are very small Discussed possible need for treatment of vaginal dysplasia, but only if higher than VAIN I Will make a plan for f/u after her biopsies return Discussed calcium intake/vit D. She is currently getting 1,200 mg of calcium, 800 IU of vit D3 and 600 mg of Magnesium in a natural supplement Awaiting records from prior provider as to her last pap smear, she would like me to review the DEXA report as well   An After Visit Summary was printed and given to the patient.  In addition to the colposcopy, 10 minutes face to face time of which over 50% was spent in counseling.

## 2015-09-11 ENCOUNTER — Other Ambulatory Visit (INDEPENDENT_AMBULATORY_CARE_PROVIDER_SITE_OTHER): Payer: BC Managed Care – PPO

## 2015-09-11 ENCOUNTER — Encounter: Payer: Self-pay | Admitting: Internal Medicine

## 2015-09-11 ENCOUNTER — Ambulatory Visit (INDEPENDENT_AMBULATORY_CARE_PROVIDER_SITE_OTHER): Payer: BC Managed Care – PPO | Admitting: Internal Medicine

## 2015-09-11 VITALS — BP 112/70 | HR 83 | Wt 163.0 lb

## 2015-09-11 DIAGNOSIS — E538 Deficiency of other specified B group vitamins: Secondary | ICD-10-CM

## 2015-09-11 DIAGNOSIS — S22068D Other fracture of T7-T8 thoracic vertebra, subsequent encounter for fracture with routine healing: Secondary | ICD-10-CM | POA: Diagnosis not present

## 2015-09-11 DIAGNOSIS — S12591S Other nondisplaced fracture of sixth cervical vertebra, sequela: Secondary | ICD-10-CM

## 2015-09-11 DIAGNOSIS — M25511 Pain in right shoulder: Secondary | ICD-10-CM

## 2015-09-11 DIAGNOSIS — M25519 Pain in unspecified shoulder: Secondary | ICD-10-CM | POA: Insufficient documentation

## 2015-09-11 DIAGNOSIS — W11XXXS Fall on and from ladder, sequela: Secondary | ICD-10-CM

## 2015-09-11 LAB — HEPATIC FUNCTION PANEL
ALK PHOS: 59 U/L (ref 39–117)
ALT: 18 U/L (ref 0–35)
AST: 19 U/L (ref 0–37)
Albumin: 4.5 g/dL (ref 3.5–5.2)
BILIRUBIN TOTAL: 0.5 mg/dL (ref 0.2–1.2)
Bilirubin, Direct: 0.1 mg/dL (ref 0.0–0.3)
Total Protein: 7.6 g/dL (ref 6.0–8.3)

## 2015-09-11 LAB — CBC WITH DIFFERENTIAL/PLATELET
Basophils Absolute: 0 10*3/uL (ref 0.0–0.1)
Basophils Relative: 0.1 % (ref 0.0–3.0)
EOS PCT: 0 % (ref 0.0–5.0)
Eosinophils Absolute: 0 10*3/uL (ref 0.0–0.7)
HCT: 39.4 % (ref 36.0–46.0)
Hemoglobin: 13.4 g/dL (ref 12.0–15.0)
Lymphocytes Relative: 31.9 % (ref 12.0–46.0)
Lymphs Abs: 2.3 10*3/uL (ref 0.7–4.0)
MCHC: 33.9 g/dL (ref 30.0–36.0)
MCV: 87 fl (ref 78.0–100.0)
MONO ABS: 0.5 10*3/uL (ref 0.1–1.0)
Monocytes Relative: 6.6 % (ref 3.0–12.0)
NEUTROS PCT: 61.4 % (ref 43.0–77.0)
Neutro Abs: 4.4 10*3/uL (ref 1.4–7.7)
PLATELETS: 385 10*3/uL (ref 150.0–400.0)
RBC: 4.53 Mil/uL (ref 3.87–5.11)
RDW: 12.9 % (ref 11.5–15.5)
WBC: 7.2 10*3/uL (ref 4.0–10.5)

## 2015-09-11 LAB — BASIC METABOLIC PANEL
BUN: 11 mg/dL (ref 6–23)
CO2: 29 mEq/L (ref 19–32)
Calcium: 9.6 mg/dL (ref 8.4–10.5)
Chloride: 101 mEq/L (ref 96–112)
Creatinine, Ser: 0.62 mg/dL (ref 0.40–1.20)
GFR: 108.13 mL/min (ref >60.00)
GLUCOSE: 106 mg/dL — AB (ref 70–99)
POTASSIUM: 3.5 meq/L (ref 3.5–5.1)
SODIUM: 138 meq/L (ref 135–145)

## 2015-09-11 LAB — TSH: TSH: 1.19 u[IU]/mL (ref 0.35–4.50)

## 2015-09-11 LAB — VITAMIN B12: Vitamin B-12: 618 pg/mL (ref 211–911)

## 2015-09-11 LAB — VITAMIN D 25 HYDROXY (VIT D DEFICIENCY, FRACTURES): VITD: 28.04 ng/mL — ABNORMAL LOW (ref 30.00–100.00)

## 2015-09-11 MED ORDER — ERGOCALCIFEROL 1.25 MG (50000 UT) PO CAPS: 50000.0000 [IU] | ORAL_CAPSULE | ORAL | Status: DC

## 2015-09-11 MED ORDER — CYANOCOBALAMIN 1000 MCG/ML IJ SOLN
1000.0000 ug | Freq: Once | INTRAMUSCULAR | Status: AC
  Administered 2015-09-11: 1000 ug via INTRAMUSCULAR

## 2015-09-11 MED ORDER — PHOSPHATIDYLSERINE-DHA-EPA 100-19.5-6.5 MG PO CAPS: 1.0000 | ORAL_CAPSULE | Freq: Every day | ORAL | Status: DC

## 2015-09-11 MED ORDER — CHOLECALCIFEROL 25 MCG (1000 UT) PO TABS: 2000.0000 [IU] | ORAL_TABLET | Freq: Every day | ORAL | Status: DC

## 2015-09-11 NOTE — Assessment & Plan Note (Signed)
R arm pain ?cerv radiculopathy - neurol ref

## 2015-09-11 NOTE — Progress Notes (Signed)
Subjective:  Patient ID: Regina [name], female    DOB: 06-Feb-1966  Age: 50 y.o. MRN: [id number]  CC: Dizziness; Numbness; Tingling; and Shoulder Pain   HPI Regina [name] presents for a head injury post-fall face and mouth paresthesia - s/p neurologic w/up - ?due to a brace. C/o vertigo - recurrent. C/o L HAs. C/o R shoulder - severe pain w/ROM.  Outpatient Prescriptions Prior to Visit  Medication Sig Dispense Refill  . CASCARA SAGRADA PO Take by mouth.    . Cholecalciferol (GNP VITAMIN D) 1000 UNITS tablet Take 1 tablet (1,000 Units total) by mouth daily. 100 tablet 3  . cyanocobalamin (,VITAMIN B-12,) 1000 MCG/ML injection Inject 1 mL (1,000 mcg total) into the skin every 14 (fourteen) days. 10 mL 11  . Cyanocobalamin (VITAMIN B 12 PO) Take 1 tablet by mouth daily.    . SYRINGE-NEEDLE, DISP, 3 ML (BD ECLIPSE SYRINGE) 25G X 1" 3 ML MISC Use IM 50 each 11   No facility-administered medications prior to visit.    ROS Review of Systems  Constitutional: Negative for chills, activity change, appetite change, fatigue and unexpected weight change.  HENT: Negative for congestion, mouth sores and sinus pressure.   Eyes: Negative for visual disturbance.  Respiratory: Negative for cough and chest tightness.   Gastrointestinal: Negative for nausea and abdominal pain.  Genitourinary: Negative for frequency, difficulty urinating and vaginal pain.  Musculoskeletal: Positive for back pain, arthralgias and neck pain. Negative for gait problem.  Skin: Negative for pallor, rash and wound.  Neurological: Positive for dizziness, weakness and headaches. Negative for tremors and numbness.  Psychiatric/Behavioral: Negative for confusion and sleep disturbance. The patient is nervous/anxious.     Objective:  BP 112/70 mmHg  Pulse 83  Wt 163 lb (73.936 kg)  SpO2 98%  BP Readings from Last 3 Encounters:  09/11/15 112/70  08/22/15 110/60  08/07/15 90/60    Wt Readings from Last 3 Encounters:    09/11/15 163 lb (73.936 kg)  08/22/15 160 lb (72.576 kg)  08/07/15 161 lb (73.029 kg)    Physical Exam  Constitutional: She appears well-developed. No distress.  HENT:  Head: Normocephalic.  Right Ear: External ear normal.  Left Ear: External ear normal.  Nose: Nose normal.  Mouth/Throat: Oropharynx is clear and moist.  Eyes: Conjunctivae are normal. Pupils are equal, round, and reactive to light. Right eye exhibits no discharge. Left eye exhibits no discharge.  Neck: Normal range of motion. Neck supple. No JVD present. No tracheal deviation present. No thyromegaly present.  Cardiovascular: Normal rate, regular rhythm and normal heart sounds.   Pulmonary/Chest: No stridor. No respiratory distress. She has no wheezes.  Abdominal: Soft. Bowel sounds are normal. She exhibits no distension and no mass. There is no tenderness. There is no rebound and no guarding.  Musculoskeletal: She exhibits tenderness. She exhibits no edema.  Lymphadenopathy:    She has no cervical adenopathy.  Neurological: She displays normal reflexes. No cranial nerve deficit. She exhibits normal muscle tone. Coordination normal.  Skin: No rash noted. No erythema.  Psychiatric: She has a normal mood and affect. Her behavior is normal. Judgment and thought content normal.  neck brace There is a small spot on post shoulder that is very tender to palpation   Lab Results  Component Value Date   WBC 12.0* 06/29/2015   HGB 12.1 06/29/2015   HCT 35.6* 06/29/2015   PLT 436* 06/29/2015   GLUCOSE 114* 06/29/2015   CHOL 201* 02/26/2015  TRIG 65.0 02/26/2015   HDL 62.10 02/26/2015   LDLCALC 126* 02/26/2015   ALT 19 06/29/2015   AST 28 06/29/2015   NA 139 06/29/2015   K 3.1* 06/29/2015   CL 107 06/29/2015   CREATININE 0.67 06/29/2015   BUN 6 06/29/2015   CO2 23 06/29/2015   TSH 1.73 02/26/2015   INR 1.16 06/29/2015    Ct Head Wo Contrast  06/29/2015  CLINICAL DATA:  Fall 30 feet from roof/ladder. EXAM: CT  HEAD WITHOUT CONTRAST CT CERVICAL SPINE WITHOUT CONTRAST TECHNIQUE: Multidetector CT imaging of the head and cervical spine was performed following the standard protocol without intravenous contrast. Multiplanar CT image reconstructions of the cervical spine were also generated. COMPARISON:  None. FINDINGS: CT HEAD FINDINGS Slightly displaced fracture of the right frontoparietal bone. Underlying acute epidural hemorrhage measuring approximately 9 mm greatest thickness with slight mass effect on the underlying right frontoparietal lobe. No appreciable midline shift. Ventricles are normal in size and configuration. No parenchymal hemorrhage. Soft tissue edema/hematoma overlying the right frontoparietal bone measures up to 1.5 cm thickness. CT CERVICAL SPINE FINDINGS Alignment of the cervical spine is normal. Minimally displaced fracture lines noted within the bilateral posterior lamina of C6. Fracture risk do not involve the more anterior facets or pedicles. No vertebral body fracture at this or any other level of the cervical spine. Paravertebral soft tissues are unremarkable. IMPRESSION: 1. Acute epidural hemorrhage overlying the right frontoparietal lobe, measuring 9 mm greatest thickness with slight mass effect on the underlying frontoparietal lobe parenchyma. No appreciable midline shift. 2. Slightly displaced fracture of the right frontoparietal bone, at the site of the acute epidural hemorrhage described above. Scalp hematoma overlies this right frontoparietal bone fracture. 3. Minimally displaced, obliquely oriented fracture involving the bilateral posterior lamina of C6. This configuration suggests stable fracture. No fracture extension into the C6 facets or pedicles. No vertebral body fracture at this or any other level. Remainder of the cervical spine appears intact and normally aligned. Critical Value/emergent results were called by telephone at the time of interpretation on 06/29/2015 at 8:52 pm to Dr.  Pattricia Boss , who verbally acknowledged these results. Electronically Signed   By: Franki Cabot M.D.   On: 06/29/2015 20:56   Ct Chest W Contrast  06/29/2015  CLINICAL DATA:  Fall approximately 30 feet from ladder. EXAM: CT CHEST, ABDOMEN, AND PELVIS WITH CONTRAST TECHNIQUE: Multidetector CT imaging of the chest, abdomen and pelvis was performed following the standard protocol during bolus administration of intravenous contrast. CONTRAST:  111mL ISOVUE-300 IOPAMIDOL (ISOVUE-300) INJECTION 61% COMPARISON:  Chest and pelvic radiographs of 06/29/2015. FINDINGS: CT CHEST FINDINGS Mediastinum/Lymph Nodes: Normal aorta, without evidence of aortic laceration or mediastinal hematoma. Normal heart size, without pericardial effusion. No mediastinal or hilar adenopathy. Tiny hiatal hernia. Lungs/Pleura: Trace left pleural fluid. No pneumothorax. Mild bibasilar atelectasis. Musculoskeletal: Nondisplaced fracture of the left T3 through T7 transverse processes. CT ABDOMEN PELVIS FINDINGS Hepatobiliary: Normal liver. Normal gallbladder, without biliary ductal dilatation. Pancreas: Normal, without mass or ductal dilatation. Spleen: Normal in size, without focal abnormality. Adrenals/Urinary Tract: Normal adrenal glands. Normal left kidney. Partially duplicated right renal collecting system. Normal urinary bladder. Stomach/Bowel: Normal remainder of the stomach. Scattered colonic diverticula. Normal colon and terminal ileum. Normal small bowel. No pneumatosis or free intraperitoneal air. Vascular/Lymphatic: Normal caliber of the aorta and branch vessels. No abdominopelvic adenopathy. Reproductive: Hysterectomy.  No adnexal mass. Other: No significant free fluid. Musculoskeletal: No acute osseous abnormality. IMPRESSION: 1. Left transverse process nondisplaced fractures  at T3 through T7. 2. No other posttraumatic deformity identified. 3. Trace left pleural fluid. Electronically Signed   By: Abigail Miyamoto M.D.   On: 06/29/2015  20:58   Ct Cervical Spine Wo Contrast  06/29/2015  CLINICAL DATA:  Fall 30 feet from roof/ladder. EXAM: CT HEAD WITHOUT CONTRAST CT CERVICAL SPINE WITHOUT CONTRAST TECHNIQUE: Multidetector CT imaging of the head and cervical spine was performed following the standard protocol without intravenous contrast. Multiplanar CT image reconstructions of the cervical spine were also generated. COMPARISON:  None. FINDINGS: CT HEAD FINDINGS Slightly displaced fracture of the right frontoparietal bone. Underlying acute epidural hemorrhage measuring approximately 9 mm greatest thickness with slight mass effect on the underlying right frontoparietal lobe. No appreciable midline shift. Ventricles are normal in size and configuration. No parenchymal hemorrhage. Soft tissue edema/hematoma overlying the right frontoparietal bone measures up to 1.5 cm thickness. CT CERVICAL SPINE FINDINGS Alignment of the cervical spine is normal. Minimally displaced fracture lines noted within the bilateral posterior lamina of C6. Fracture risk do not involve the more anterior facets or pedicles. No vertebral body fracture at this or any other level of the cervical spine. Paravertebral soft tissues are unremarkable. IMPRESSION: 1. Acute epidural hemorrhage overlying the right frontoparietal lobe, measuring 9 mm greatest thickness with slight mass effect on the underlying frontoparietal lobe parenchyma. No appreciable midline shift. 2. Slightly displaced fracture of the right frontoparietal bone, at the site of the acute epidural hemorrhage described above. Scalp hematoma overlies this right frontoparietal bone fracture. 3. Minimally displaced, obliquely oriented fracture involving the bilateral posterior lamina of C6. This configuration suggests stable fracture. No fracture extension into the C6 facets or pedicles. No vertebral body fracture at this or any other level. Remainder of the cervical spine appears intact and normally aligned. Critical  Value/emergent results were called by telephone at the time of interpretation on 06/29/2015 at 8:52 pm to Dr. Pattricia Boss , who verbally acknowledged these results. Electronically Signed   By: Franki Cabot M.D.   On: 06/29/2015 20:56   Ct Abdomen Pelvis W Contrast  06/29/2015  CLINICAL DATA:  Fall approximately 30 feet from ladder. EXAM: CT CHEST, ABDOMEN, AND PELVIS WITH CONTRAST TECHNIQUE: Multidetector CT imaging of the chest, abdomen and pelvis was performed following the standard protocol during bolus administration of intravenous contrast. CONTRAST:  158mL ISOVUE-300 IOPAMIDOL (ISOVUE-300) INJECTION 61% COMPARISON:  Chest and pelvic radiographs of 06/29/2015. FINDINGS: CT CHEST FINDINGS Mediastinum/Lymph Nodes: Normal aorta, without evidence of aortic laceration or mediastinal hematoma. Normal heart size, without pericardial effusion. No mediastinal or hilar adenopathy. Tiny hiatal hernia. Lungs/Pleura: Trace left pleural fluid. No pneumothorax. Mild bibasilar atelectasis. Musculoskeletal: Nondisplaced fracture of the left T3 through T7 transverse processes. CT ABDOMEN PELVIS FINDINGS Hepatobiliary: Normal liver. Normal gallbladder, without biliary ductal dilatation. Pancreas: Normal, without mass or ductal dilatation. Spleen: Normal in size, without focal abnormality. Adrenals/Urinary Tract: Normal adrenal glands. Normal left kidney. Partially duplicated right renal collecting system. Normal urinary bladder. Stomach/Bowel: Normal remainder of the stomach. Scattered colonic diverticula. Normal colon and terminal ileum. Normal small bowel. No pneumatosis or free intraperitoneal air. Vascular/Lymphatic: Normal caliber of the aorta and branch vessels. No abdominopelvic adenopathy. Reproductive: Hysterectomy.  No adnexal mass. Other: No significant free fluid. Musculoskeletal: No acute osseous abnormality. IMPRESSION: 1. Left transverse process nondisplaced fractures at T3 through T7. 2. No other posttraumatic  deformity identified. 3. Trace left pleural fluid. Electronically Signed   By: Abigail Miyamoto M.D.   On: 06/29/2015 20:58   Dg  Pelvis Portable  06/29/2015  CLINICAL DATA:  Fall from roof. EXAM: PORTABLE PELVIS 1-2 VIEWS COMPARISON:  None. FINDINGS: There is no evidence of pelvic fracture or diastasis. No pelvic bone lesions are seen. Soft tissues about the pelvis and lower abdomen are unremarkable. IMPRESSION: Negative. Electronically Signed   By: Franki Cabot M.D.   On: 06/29/2015 19:29   Dg Chest Portable 1 View  06/29/2015  CLINICAL DATA:  Fall from roof, history of atrial fibrillation. EXAM: PORTABLE CHEST 1 VIEW COMPARISON:  None. FINDINGS: The heart size and mediastinal contours are within normal limits. Both lungs are clear, given the oblique patient positioning. No osseous fracture or dislocation seen. IMPRESSION: No acute findings. Electronically Signed   By: Franki Cabot M.D.   On: 06/29/2015 19:28   Dg Hand Complete Right  06/29/2015  CLINICAL DATA:  Initial encounter for Fall from building, about 2 stories, right hand laceration- anterior hand at 5th metacarpal. EXAM: RIGHT HAND - COMPLETE 3+ VIEW COMPARISON:  None. FINDINGS: Overlap of fingers on the lateral. Possible nondisplaced fracture at the fifth metacarpal neck, most apparent on the oblique image. No growth plate extension. IMPRESSION: Possible nondisplaced fracture involving the fifth metacarpal neck. Correlate with point tenderness. Electronically Signed   By: Abigail Miyamoto M.D.   On: 06/29/2015 21:18    Assessment & Plan:   There are no diagnoses linked to this encounter. I am having Ms. Mabus maintain her Cholecalciferol, SYRINGE-NEEDLE (DISP) 3 ML, cyanocobalamin, CASCARA SAGRADA PO, and Cyanocobalamin (VITAMIN B 12 PO).  No orders of the defined types were placed in this encounter.     Follow-up: No Follow-up on file.  Walker Kehr, MD

## 2015-09-11 NOTE — Assessment & Plan Note (Signed)
In a brace 

## 2015-09-11 NOTE — Progress Notes (Signed)
Pre visit review using our clinic review tool, if applicable. No additional management support is needed unless otherwise documented below in the visit note. 

## 2015-09-11 NOTE — Assessment & Plan Note (Addendum)
6/17 pos-fall - ?radiculopathy Neurologist ref

## 2015-09-11 NOTE — Assessment & Plan Note (Signed)
Healing. R shoulder pain is worse

## 2015-09-12 ENCOUNTER — Encounter: Payer: Self-pay | Admitting: Internal Medicine

## 2015-09-20 DIAGNOSIS — M5412 Radiculopathy, cervical region: Secondary | ICD-10-CM | POA: Insufficient documentation

## 2015-09-24 ENCOUNTER — Other Ambulatory Visit (HOSPITAL_COMMUNITY): Payer: BC Managed Care – PPO

## 2015-10-07 ENCOUNTER — Other Ambulatory Visit: Payer: Self-pay

## 2015-10-07 ENCOUNTER — Ambulatory Visit (HOSPITAL_COMMUNITY): Payer: BC Managed Care – PPO | Attending: Cardiology

## 2015-10-07 DIAGNOSIS — I071 Rheumatic tricuspid insufficiency: Secondary | ICD-10-CM | POA: Insufficient documentation

## 2015-10-07 DIAGNOSIS — I480 Paroxysmal atrial fibrillation: Secondary | ICD-10-CM | POA: Diagnosis not present

## 2015-10-07 DIAGNOSIS — I509 Heart failure, unspecified: Secondary | ICD-10-CM | POA: Diagnosis present

## 2015-10-07 DIAGNOSIS — I340 Nonrheumatic mitral (valve) insufficiency: Secondary | ICD-10-CM | POA: Diagnosis not present

## 2015-10-07 DIAGNOSIS — I371 Nonrheumatic pulmonary valve insufficiency: Secondary | ICD-10-CM | POA: Diagnosis not present

## 2015-10-07 DIAGNOSIS — I358 Other nonrheumatic aortic valve disorders: Secondary | ICD-10-CM | POA: Diagnosis not present

## 2015-10-07 DIAGNOSIS — I517 Cardiomegaly: Secondary | ICD-10-CM | POA: Diagnosis not present

## 2015-10-11 ENCOUNTER — Encounter: Payer: Self-pay | Admitting: Internal Medicine

## 2015-10-11 ENCOUNTER — Ambulatory Visit (INDEPENDENT_AMBULATORY_CARE_PROVIDER_SITE_OTHER): Payer: BC Managed Care – PPO | Admitting: Internal Medicine

## 2015-10-11 VITALS — BP 104/62 | HR 75 | Wt 164.0 lb

## 2015-10-11 DIAGNOSIS — G44329 Chronic post-traumatic headache, not intractable: Secondary | ICD-10-CM

## 2015-10-11 DIAGNOSIS — E538 Deficiency of other specified B group vitamins: Secondary | ICD-10-CM | POA: Diagnosis not present

## 2015-10-11 DIAGNOSIS — S12591S Other nondisplaced fracture of sixth cervical vertebra, sequela: Secondary | ICD-10-CM

## 2015-10-11 DIAGNOSIS — L659 Nonscarring hair loss, unspecified: Secondary | ICD-10-CM | POA: Insufficient documentation

## 2015-10-11 DIAGNOSIS — S0291XS Unspecified fracture of skull, sequela: Secondary | ICD-10-CM

## 2015-10-11 DIAGNOSIS — S060X9S Concussion with loss of consciousness of unspecified duration, sequela: Secondary | ICD-10-CM

## 2015-10-11 DIAGNOSIS — S060XAS Concussion with loss of consciousness status unknown, sequela: Secondary | ICD-10-CM

## 2015-10-11 DIAGNOSIS — M25511 Pain in right shoulder: Secondary | ICD-10-CM | POA: Diagnosis not present

## 2015-10-11 MED ORDER — MECLIZINE HCL 12.5 MG PO TABS: 12.5000 mg | ORAL_TABLET | Freq: Three times a day (TID) | ORAL | Status: DC | PRN

## 2015-10-11 MED ORDER — ACETAMINOPHEN-CODEINE 300-30 MG PO TABS
0.5000 | ORAL_TABLET | ORAL | Status: DC | PRN
Start: 2015-10-11 — End: 2016-06-03

## 2015-10-11 MED ORDER — CYANOCOBALAMIN 1000 MCG/ML IJ SOLN
1000.0000 ug | Freq: Once | INTRAMUSCULAR | Status: AC
  Administered 2015-10-11: 1000 ug via INTRAMUSCULAR

## 2015-10-11 MED ORDER — CHOLECALCIFEROL 25 MCG (1000 UT) PO TABS: 2000.0000 [IU] | ORAL_TABLET | Freq: Every day | ORAL | Status: DC

## 2015-10-11 MED ORDER — BIOTIN 5000 MCG PO TABS: ORAL_TABLET | ORAL | Status: DC

## 2015-10-11 NOTE — Assessment & Plan Note (Signed)
Post-trauma stress Biotin Rogain for women Vit D and B12

## 2015-10-11 NOTE — Assessment & Plan Note (Signed)
Remain out of work Start PT when advised by Dr Posey Pronto

## 2015-10-11 NOTE — Patient Instructions (Addendum)
You can try Rogain for women   Start Meclizine. Start Laruth Bouchard - Daroff exercise several times a day as dirrected.

## 2015-10-11 NOTE — Progress Notes (Signed)
Pre visit review using our clinic review tool, if applicable. No additional management support is needed unless otherwise documented below in the visit note. 

## 2015-10-11 NOTE — Progress Notes (Signed)
Subjective:  Patient ID: Regina [name], female    DOB: [birth year]  Age: 50 y.o. MRN: [id number]  CC: No chief complaint on file.   HPI Regina [name] presents for dizziness, HAs in the back w/burning. C/o hair loss. C/o worsening pain in the R shoulder. She has not taken Tramadol. C/o fatigue C/o hair loss  Outpatient Prescriptions Prior to Visit  Medication Sig Dispense Refill  . CASCARA SAGRADA PO Take by mouth.    . Cholecalciferol (GNP VITAMIN D) 1000 units tablet Take 2 tablets (2,000 Units total) by mouth daily. 100 tablet 3  . cyanocobalamin (,VITAMIN B-12,) 1000 MCG/ML injection Inject 1 mL (1,000 mcg total) into the skin every 14 (fourteen) days. 10 mL 11  . ergocalciferol (VITAMIN D2) 50000 units capsule Take 1 capsule (50,000 Units total) by mouth once a week. 6 capsule 0  . Phosphatidylserine-DHA-EPA (VAYACOG) 100-19.5-6.5 MG CAPS Take 1 capsule by mouth daily. 30 capsule 11  . SYRINGE-NEEDLE, DISP, 3 ML (BD ECLIPSE SYRINGE) 25G X 1" 3 ML MISC Use IM 50 each 11   No facility-administered medications prior to visit.    ROS Review of Systems  Constitutional: Negative for chills, activity change, appetite change, fatigue and unexpected weight change.  HENT: Negative for congestion, mouth sores, sinus pressure and voice change.   Eyes: Negative for visual disturbance.  Respiratory: Negative for cough and chest tightness.   Gastrointestinal: Negative for nausea and abdominal pain.  Genitourinary: Negative for frequency, difficulty urinating and vaginal pain.  Musculoskeletal: Positive for back pain, neck pain and neck stiffness. Negative for gait problem.  Skin: Negative for pallor and rash.  Neurological: Negative for dizziness, tremors, weakness, numbness and headaches.  Psychiatric/Behavioral: Negative for suicidal ideas, confusion and sleep disturbance.    Objective:  BP 104/62 mmHg  Pulse 75  Wt 164 lb (74.39 kg)  SpO2 96%  BP Readings from Last 3  Encounters:  10/11/15 104/62  09/11/15 112/70  08/22/15 110/60    Wt Readings from Last 3 Encounters:  10/11/15 164 lb (74.39 kg)  09/11/15 163 lb (73.936 kg)  08/22/15 160 lb (72.576 kg)    Physical Exam  Constitutional: She appears well-developed. No distress.  HENT:  Head: Normocephalic.  Right Ear: External ear normal.  Left Ear: External ear normal.  Nose: Nose normal.  Mouth/Throat: Oropharynx is clear and moist.  Eyes: Conjunctivae are normal. Pupils are equal, round, and reactive to light. Right eye exhibits no discharge. Left eye exhibits no discharge.  Neck: Normal range of motion. Neck supple. No JVD present. No tracheal deviation present. No thyromegaly present.  Cardiovascular: Normal rate, regular rhythm and normal heart sounds.   Pulmonary/Chest: No stridor. No respiratory distress. She has no wheezes.  Abdominal: Soft. Bowel sounds are normal. She exhibits no distension and no mass. There is no tenderness. There is no rebound and no guarding.  Musculoskeletal: She exhibits no edema or tenderness.  Lymphadenopathy:    She has no cervical adenopathy.  Neurological: She displays normal reflexes. No cranial nerve deficit. She exhibits normal muscle tone. Coordination normal.  Skin: No rash noted. No erythema.  Psychiatric: She has a normal mood and affect. Her behavior is normal. Judgment and thought content normal.    Lab Results  Component Value Date   WBC 7.2 09/11/2015   HGB 13.4 09/11/2015   HCT 39.4 09/11/2015   PLT 385.0 09/11/2015   GLUCOSE 106* 09/11/2015   CHOL 201* 02/26/2015   TRIG 65.0 02/26/2015  HDL 62.10 02/26/2015   LDLCALC 126* 02/26/2015   ALT 18 09/11/2015   AST 19 09/11/2015   NA 138 09/11/2015   K 3.5 09/11/2015   CL 101 09/11/2015   CREATININE 0.62 09/11/2015   BUN 11 09/11/2015   CO2 29 09/11/2015   TSH 1.19 09/11/2015   INR 1.16 06/29/2015    Ct Head Wo Contrast  06/29/2015  CLINICAL DATA:  Fall 30 feet from roof/ladder.  EXAM: CT HEAD WITHOUT CONTRAST CT CERVICAL SPINE WITHOUT CONTRAST TECHNIQUE: Multidetector CT imaging of the head and cervical spine was performed following the standard protocol without intravenous contrast. Multiplanar CT image reconstructions of the cervical spine were also generated. COMPARISON:  None. FINDINGS: CT HEAD FINDINGS Slightly displaced fracture of the right frontoparietal bone. Underlying acute epidural hemorrhage measuring approximately 9 mm greatest thickness with slight mass effect on the underlying right frontoparietal lobe. No appreciable midline shift. Ventricles are normal in size and configuration. No parenchymal hemorrhage. Soft tissue edema/hematoma overlying the right frontoparietal bone measures up to 1.5 cm thickness. CT CERVICAL SPINE FINDINGS Alignment of the cervical spine is normal. Minimally displaced fracture lines noted within the bilateral posterior lamina of C6. Fracture risk do not involve the more anterior facets or pedicles. No vertebral body fracture at this or any other level of the cervical spine. Paravertebral soft tissues are unremarkable. IMPRESSION: 1. Acute epidural hemorrhage overlying the right frontoparietal lobe, measuring 9 mm greatest thickness with slight mass effect on the underlying frontoparietal lobe parenchyma. No appreciable midline shift. 2. Slightly displaced fracture of the right frontoparietal bone, at the site of the acute epidural hemorrhage described above. Scalp hematoma overlies this right frontoparietal bone fracture. 3. Minimally displaced, obliquely oriented fracture involving the bilateral posterior lamina of C6. This configuration suggests stable fracture. No fracture extension into the C6 facets or pedicles. No vertebral body fracture at this or any other level. Remainder of the cervical spine appears intact and normally aligned. Critical Value/emergent results were called by telephone at the time of interpretation on 06/29/2015 at 8:52 pm to  Dr. Pattricia Boss , who verbally acknowledged these results. Electronically Signed   By: Franki Cabot M.D.   On: 06/29/2015 20:56   Ct Chest W Contrast  06/29/2015  CLINICAL DATA:  Fall approximately 30 feet from ladder. EXAM: CT CHEST, ABDOMEN, AND PELVIS WITH CONTRAST TECHNIQUE: Multidetector CT imaging of the chest, abdomen and pelvis was performed following the standard protocol during bolus administration of intravenous contrast. CONTRAST:  147mL ISOVUE-300 IOPAMIDOL (ISOVUE-300) INJECTION 61% COMPARISON:  Chest and pelvic radiographs of 06/29/2015. FINDINGS: CT CHEST FINDINGS Mediastinum/Lymph Nodes: Normal aorta, without evidence of aortic laceration or mediastinal hematoma. Normal heart size, without pericardial effusion. No mediastinal or hilar adenopathy. Tiny hiatal hernia. Lungs/Pleura: Trace left pleural fluid. No pneumothorax. Mild bibasilar atelectasis. Musculoskeletal: Nondisplaced fracture of the left T3 through T7 transverse processes. CT ABDOMEN PELVIS FINDINGS Hepatobiliary: Normal liver. Normal gallbladder, without biliary ductal dilatation. Pancreas: Normal, without mass or ductal dilatation. Spleen: Normal in size, without focal abnormality. Adrenals/Urinary Tract: Normal adrenal glands. Normal left kidney. Partially duplicated right renal collecting system. Normal urinary bladder. Stomach/Bowel: Normal remainder of the stomach. Scattered colonic diverticula. Normal colon and terminal ileum. Normal small bowel. No pneumatosis or free intraperitoneal air. Vascular/Lymphatic: Normal caliber of the aorta and branch vessels. No abdominopelvic adenopathy. Reproductive: Hysterectomy.  No adnexal mass. Other: No significant free fluid. Musculoskeletal: No acute osseous abnormality. IMPRESSION: 1. Left transverse process nondisplaced fractures at T3 through T7. 2.  No other posttraumatic deformity identified. 3. Trace left pleural fluid. Electronically Signed   By: Abigail Miyamoto M.D.   On: 06/29/2015  20:58   Ct Cervical Spine Wo Contrast  06/29/2015  CLINICAL DATA:  Fall 30 feet from roof/ladder. EXAM: CT HEAD WITHOUT CONTRAST CT CERVICAL SPINE WITHOUT CONTRAST TECHNIQUE: Multidetector CT imaging of the head and cervical spine was performed following the standard protocol without intravenous contrast. Multiplanar CT image reconstructions of the cervical spine were also generated. COMPARISON:  None. FINDINGS: CT HEAD FINDINGS Slightly displaced fracture of the right frontoparietal bone. Underlying acute epidural hemorrhage measuring approximately 9 mm greatest thickness with slight mass effect on the underlying right frontoparietal lobe. No appreciable midline shift. Ventricles are normal in size and configuration. No parenchymal hemorrhage. Soft tissue edema/hematoma overlying the right frontoparietal bone measures up to 1.5 cm thickness. CT CERVICAL SPINE FINDINGS Alignment of the cervical spine is normal. Minimally displaced fracture lines noted within the bilateral posterior lamina of C6. Fracture risk do not involve the more anterior facets or pedicles. No vertebral body fracture at this or any other level of the cervical spine. Paravertebral soft tissues are unremarkable. IMPRESSION: 1. Acute epidural hemorrhage overlying the right frontoparietal lobe, measuring 9 mm greatest thickness with slight mass effect on the underlying frontoparietal lobe parenchyma. No appreciable midline shift. 2. Slightly displaced fracture of the right frontoparietal bone, at the site of the acute epidural hemorrhage described above. Scalp hematoma overlies this right frontoparietal bone fracture. 3. Minimally displaced, obliquely oriented fracture involving the bilateral posterior lamina of C6. This configuration suggests stable fracture. No fracture extension into the C6 facets or pedicles. No vertebral body fracture at this or any other level. Remainder of the cervical spine appears intact and normally aligned. Critical  Value/emergent results were called by telephone at the time of interpretation on 06/29/2015 at 8:52 pm to Dr. Pattricia Boss , who verbally acknowledged these results. Electronically Signed   By: Franki Cabot M.D.   On: 06/29/2015 20:56   Ct Abdomen Pelvis W Contrast  06/29/2015  CLINICAL DATA:  Fall approximately 30 feet from ladder. EXAM: CT CHEST, ABDOMEN, AND PELVIS WITH CONTRAST TECHNIQUE: Multidetector CT imaging of the chest, abdomen and pelvis was performed following the standard protocol during bolus administration of intravenous contrast. CONTRAST:  155mL ISOVUE-300 IOPAMIDOL (ISOVUE-300) INJECTION 61% COMPARISON:  Chest and pelvic radiographs of 06/29/2015. FINDINGS: CT CHEST FINDINGS Mediastinum/Lymph Nodes: Normal aorta, without evidence of aortic laceration or mediastinal hematoma. Normal heart size, without pericardial effusion. No mediastinal or hilar adenopathy. Tiny hiatal hernia. Lungs/Pleura: Trace left pleural fluid. No pneumothorax. Mild bibasilar atelectasis. Musculoskeletal: Nondisplaced fracture of the left T3 through T7 transverse processes. CT ABDOMEN PELVIS FINDINGS Hepatobiliary: Normal liver. Normal gallbladder, without biliary ductal dilatation. Pancreas: Normal, without mass or ductal dilatation. Spleen: Normal in size, without focal abnormality. Adrenals/Urinary Tract: Normal adrenal glands. Normal left kidney. Partially duplicated right renal collecting system. Normal urinary bladder. Stomach/Bowel: Normal remainder of the stomach. Scattered colonic diverticula. Normal colon and terminal ileum. Normal small bowel. No pneumatosis or free intraperitoneal air. Vascular/Lymphatic: Normal caliber of the aorta and branch vessels. No abdominopelvic adenopathy. Reproductive: Hysterectomy.  No adnexal mass. Other: No significant free fluid. Musculoskeletal: No acute osseous abnormality. IMPRESSION: 1. Left transverse process nondisplaced fractures at T3 through T7. 2. No other posttraumatic  deformity identified. 3. Trace left pleural fluid. Electronically Signed   By: Abigail Miyamoto M.D.   On: 06/29/2015 20:58   Dg Pelvis Portable  06/29/2015  CLINICAL DATA:  Fall from roof. EXAM: PORTABLE PELVIS 1-2 VIEWS COMPARISON:  None. FINDINGS: There is no evidence of pelvic fracture or diastasis. No pelvic bone lesions are seen. Soft tissues about the pelvis and lower abdomen are unremarkable. IMPRESSION: Negative. Electronically Signed   By: Franki Cabot M.D.   On: 06/29/2015 19:29   Dg Chest Portable 1 View  06/29/2015  CLINICAL DATA:  Fall from roof, history of atrial fibrillation. EXAM: PORTABLE CHEST 1 VIEW COMPARISON:  None. FINDINGS: The heart size and mediastinal contours are within normal limits. Both lungs are clear, given the oblique patient positioning. No osseous fracture or dislocation seen. IMPRESSION: No acute findings. Electronically Signed   By: Franki Cabot M.D.   On: 06/29/2015 19:28   Dg Hand Complete Right  06/29/2015  CLINICAL DATA:  Initial encounter for Fall from building, about 2 stories, right hand laceration- anterior hand at 5th metacarpal. EXAM: RIGHT HAND - COMPLETE 3+ VIEW COMPARISON:  None. FINDINGS: Overlap of fingers on the lateral. Possible nondisplaced fracture at the fifth metacarpal neck, most apparent on the oblique image. No growth plate extension. IMPRESSION: Possible nondisplaced fracture involving the fifth metacarpal neck. Correlate with point tenderness. Electronically Signed   By: Abigail Miyamoto M.D.   On: 06/29/2015 21:18    Assessment & Plan:   There are no diagnoses linked to this encounter. I am having Ms. Melvin maintain her SYRINGE-NEEDLE (DISP) 3 ML, cyanocobalamin, CASCARA SAGRADA PO, Phosphatidylserine-DHA-EPA, Cholecalciferol, ergocalciferol, traMADol, and acetaminophen.  Meds ordered this encounter  Medications  . traMADol (ULTRAM) 50 MG tablet    Sig: Take 50 mg by mouth every 6 (six) hours as needed.  Marland Kitchen acetaminophen (TYLENOL) 500 MG  tablet    Sig: Take 500 mg by mouth every 6 (six) hours as needed.     Follow-up: No Follow-up on file.  Walker Kehr, MD

## 2015-10-11 NOTE — Assessment & Plan Note (Addendum)
Vayacog Occip nerve block if needed Remain off work

## 2015-10-11 NOTE — Assessment & Plan Note (Signed)
Chronic  On B12 sq inj

## 2015-10-11 NOTE — Assessment & Plan Note (Signed)
Sports med Dr Tamala Julian Tylenol #3 prn - low dose  Potential benefits of a long term opioids use as well as potential risks (i.e. addiction risk, apnea etc) and complications (i.e. Somnolence, constipation and others) were explained to the patient and were aknowledged.

## 2015-10-11 NOTE — Assessment & Plan Note (Signed)
Memory loss, poor focus

## 2015-10-22 ENCOUNTER — Telehealth: Payer: Self-pay | Admitting: Internal Medicine

## 2015-10-22 NOTE — Telephone Encounter (Signed)
Patient walked in today and dropped of form [place] [employer] forms for her employer. Filled out completely and sending to Dr [employer] box for signature. Please call patient to advise that paperwork is ready. She must turn in by 11/07/2015

## 2015-10-23 NOTE — Telephone Encounter (Signed)
Done

## 2015-10-29 ENCOUNTER — Ambulatory Visit: Payer: BC Managed Care – PPO | Admitting: Internal Medicine

## 2015-10-29 NOTE — Progress Notes (Signed)
Regina [name], [place] [address] Phone: [phone] Subjective:    I'm seeing this patient by the request  of:  Regina Kehr, MD  CC: Right shoulder pain  [id number]  Regina [name] is a 50 y.o. female coming in with complaint of right shoulder pain. Patient has worsening back in April 2017 and have a fall causing a in acute epidural hemorrhage as well as a slightly displaced fracture of the right frontal parietal bone and a posterior fracture of the lamina at C6.  Patient's other imaging also had a left transverse process nondisplaced fractures at T3-T7. Patient has been following up with orthopedics and neurology. Patient states She continues to have right shoulder pain. Patient states that it seems sore overall. Patient rates the severity of pain is 8 out of 10. Seems to be only with certain positioning. Has improved since the initial injury. Patient states that the problem is she wants to become more active but finds it difficult. Patient also states that certain movements at work when she tries to use the mouse or sits for long amount of time has more discomfort. Continues to wear a neck brace at this moment but seems to be responding to conservative therapy with her neck as well. Denies any significant radiation down the arm or any numbness or tingling. Denies any significant weakness.      Past Medical History:  Diagnosis Date  . Allergic urticaria   . ARTHRALGIA   . Arthritis   . Atrial fibrillation ([place])   . B12 DEFICIENCY   . CFS (chronic fatigue syndrome)   . Endometriosis   . Epidural hemorrhage with loss of consciousness ([place]) 06/29/15  . Fibroid   . Fibromyalgia   . Fracture of cervical vertebra, C6 ([place]) 06/29/15  . Gastritis   . GERD   . Heart murmur   . History of endometriosis   . IBS (irritable bowel syndrome)   . Internal hemorrhoids   . MVP (mitral valve prolapse)   . Osteoarthritis   . Rosacea   .  Thoracic compression fracture ([place]) 06/29/15   T2-T6   Past Surgical History:  Procedure Laterality Date  . ABDOMINAL HYSTERECTOMY     partial  . ABDOMINAL HYSTERECTOMY    . ANAL FISSURE REPAIR     Dr Rise [name] 2009  . APPENDECTOMY    . COLPOSCOPY    . HEMORRHOID SURGERY     Dr. Rise [name] 2009  . OVARIAN CYST REMOVAL    . RT BUNIONECTOMT    . TONSILLECTOMY AND ADENOIDECTOMY    . TUBAL LIGATION    . UTERINE FIBROID SURGERY     Social History   Social History  . Marital status: Married    Spouse name: N/A  . Number of children: N/A  . Years of education: N/A   Social History Main Topics  . Smoking status: Never Smoker  . Smokeless tobacco: Never Used  . Alcohol use 0.6 - 1.2 oz/week    1 - 2 Standard drinks or equivalent per week     Comment: occasional   . Drug use: No  . Sexual activity: Yes    Partners: Male   Other Topics Concern  . None   Social History Narrative   ** Merged History Encounter **       Working 2 jobs, 6d/wk      Regular exercise-no      Divorced   Allergies  Allergen Reactions  .  Indocin [Indomethacin] Swelling, Rash and Other (See Comments)    REACTION: lupus like illness; butterfly rash, pain and swelling all over body.   . Morphine Sulfate Nausea And Vomiting  . Sulfacetamide Sodium Swelling  . Caffeine Other (See Comments)    Induces A-fib  . Morphine And Related     nausea  . Sulfa Antibiotics Swelling  . Azithromycin Rash    ALL MYCIN DRUGS  . Erythromycin Stearate Hives and Swelling    REACTION: all mycins   Family History  Problem Relation Age of Onset  . Asthma Neg Hx   . Diabetes      uncles/aunts  . Lung cancer Mother   . Thyroid cancer Mother   . Cancer Mother     INTESTINAL  . Healthy Father   . Von Willebrand disease Daughter     Past medical history, social, surgical and family history all reviewed in electronic medical record.  No pertanent information unless stated regarding to the chief complaint.    Review of Systems: No headache, visual changes, nausea, vomiting, diarrhea, constipation, dizziness, abdominal pain, skin rash, fevers, chills, night sweats, weight loss, swollen lymph nodes, body aches, joint swelling, muscle aches, chest pain, shortness of breath, mood changes.   Objective  Blood pressure 102/64, pulse 62, height 5\' 4"  (1.626 m), weight 164 lb (74.4 kg), SpO2 96 %.  General: No apparent distress alert and oriented x3 mood and affect normal, dressed appropriately.  HEENT: Pupils equal, extraocular movements intact  Respiratory: Patient's speak in full sentences and does not appear short of breath  Cardiovascular: No lower extremity edema, non tender, no erythema  Skin: Warm dry intact with no signs of infection or rash on extremities or on axial skeleton.  Abdomen: Soft nontender  Neuro: Cranial nerves II through XII are intact, neurovascularly intact in all extremities with 2+ DTRs and 2+ pulses.  Lymph: No lymphadenopathy of posterior or anterior cervical chain or axillae bilaterally.  Gait normal with good balance and coordination.  MSK:  Non tender with full range of motion and good stability and symmetric strength and tone of  elbows, wrist, hip, knee and ankles bilaterally.   Neck: Inspection unremarkable. No palpable stepoffs. Did not test Spurling secondary to history of fracture Still has significant limitation in range of motion Grip strength and sensation normal in bilateral hands Strength good C4 to T1 distribution No sensory change to C4 to T1 Negative Hoffman sign bilaterally Reflexes normal  Wrist: Right Inspection normal with no visible erythema or swelling. ROM smooth and normal with good flexion and extension and ulnar/radial deviation that is symmetrical with opposite wrist. Severe tenderness over the TFCC No snuffbox tenderness. No tenderness over [place]. Strength 5/5 in all directions without pain. Negative Finkelstein, tinel's and  phalens. Negative Watson's test.   Shoulder: Right Inspection reveals no abnormalities, atrophy or asymmetry. Severe tenderness to palpation over the distal clavicle near the acromial clavicular joint ROM is full in all planes passively. Rotator cuff strength normal throughout. signs of impingement with positive Neer and Hawkin's tests, but negative empty can sign. More though with a positive crossover sign Speeds and Yergason's tests normal. No labral pathology noted with negative Obrien's, negative clunk and good stability. Normal scapular function observed. No painful arc and no drop arm sign. No apprehension sign Contralateral shoulder unremarkable  MSK US performed of: Right This study was ordered, performed, and interpreted by Charlann Boxer D.O.  Shoulder:   Supraspinatus:  Appears normal on long  and transverse views, appear stem patient may have had a tear that seems to be healing appropriately. Infraspinatus:  Appears normal on long and transverse views. Significant increase in Doppler flow Subscapularis:  Appears normal on long and transverse views. Positive bursa Teres Minor:  Appears normal on long and transverse views. AC joint:  Before meals joint seems unremarkable except patient does have what appears to be a distal clavicle fracture that seems to be healing slowly. No significant cortical defect noted. Still some mild remaining hypoechoic changes and increasing Doppler flow. Glenohumeral Joint:  Appears normal without effusion. Glenoid Labrum:  Mild increase in Doppler flow but no true tear appreciated. Biceps Tendon:  Appears normal on long and transverse views, no fraying of tendon, tendon located in intertubercular groove, no subluxation with shoulder internal or external rotation.  Impression: Delayed healing distal clavicle fracture, questionable labral tear      Impression and Recommendations:     This case required medical decision making of moderate  complexity.      Note: This dictation was prepared with Dragon dictation along with smaller phrase technology. Any transcriptional errors that result from this process are unintentional.

## 2015-10-30 ENCOUNTER — Encounter: Payer: Self-pay | Admitting: Family Medicine

## 2015-10-30 ENCOUNTER — Other Ambulatory Visit: Payer: Self-pay

## 2015-10-30 ENCOUNTER — Ambulatory Visit (INDEPENDENT_AMBULATORY_CARE_PROVIDER_SITE_OTHER): Payer: BC Managed Care – PPO | Admitting: Family Medicine

## 2015-10-30 VITALS — BP 102/64 | HR 62 | Ht 64.0 in | Wt 164.0 lb

## 2015-10-30 DIAGNOSIS — S42009A Fracture of unspecified part of unspecified clavicle, initial encounter for closed fracture: Secondary | ICD-10-CM | POA: Insufficient documentation

## 2015-10-30 DIAGNOSIS — M25511 Pain in right shoulder: Secondary | ICD-10-CM | POA: Diagnosis not present

## 2015-10-30 DIAGNOSIS — S42031A Displaced fracture of lateral end of right clavicle, initial encounter for closed fracture: Secondary | ICD-10-CM | POA: Diagnosis not present

## 2015-10-30 DIAGNOSIS — M25531 Pain in right wrist: Secondary | ICD-10-CM | POA: Diagnosis not present

## 2015-10-30 DIAGNOSIS — S42001A Fracture of unspecified part of right clavicle, initial encounter for closed fracture: Secondary | ICD-10-CM | POA: Diagnosis not present

## 2015-10-30 MED ORDER — VITAMIN D (ERGOCALCIFEROL) 1.25 MG (50000 UNIT) PO CAPS: 50000.0000 [IU] | ORAL_CAPSULE | ORAL | 0 refills | Status: DC

## 2015-10-30 NOTE — Assessment & Plan Note (Signed)
I believe the patient did have a clavicle fracture, probably a small rotator cuff tear and labral tear that seems to be healing appropriately. Patient is a clavicle fracture seems to have more of a delayed healing. We started her on once weekly vitamin D supplementation. And sent to formal physical therapy to learn home exercises. Discussed icing regimen. Discussed which activities to do in which ones to potentially avoid. Patient wanted to avoid any type of prescription medications at this time. We discussed possible over-the-counter ones a could be more beneficial. Patient and will come back and see me again in 3-4 weeks to make sure she is responding appropriate.

## 2015-10-30 NOTE — Patient Instructions (Signed)
Good to see you Ice 20 minutes 2 times daily. Usually after activity and before bed. Avoid activity where hand I out of peripheral vision  Once weekly vitamin D for 12 weeks.  Turmeric 500mg  twice daily for inflammation  Arnica lotion 2 times daily  Physical therapy will be calling you  See me again in 3 weeks so I know you are making progress.

## 2015-10-30 NOTE — Assessment & Plan Note (Signed)
Discussed with patient at great length. I do believe that she likely has a TFCC tear. Patient wants to avoid any type of surgical intervention at this time. We discussed the possibility of bracing. Patient will continue with vitamin D. Patient has worsening symptoms she would consider PRP injection. We will discuss at follow-up.

## 2015-11-01 ENCOUNTER — Ambulatory Visit (INDEPENDENT_AMBULATORY_CARE_PROVIDER_SITE_OTHER): Payer: BC Managed Care – PPO | Admitting: Neurology

## 2015-11-01 ENCOUNTER — Encounter: Payer: Self-pay | Admitting: Neurology

## 2015-11-01 VITALS — BP 110/74 | HR 80 | Ht 64.0 in | Wt 167.0 lb

## 2015-11-01 DIAGNOSIS — S12501D Unspecified nondisplaced fracture of sixth cervical vertebra, subsequent encounter for fracture with routine healing: Secondary | ICD-10-CM

## 2015-11-01 DIAGNOSIS — G4486 Cervicogenic headache: Secondary | ICD-10-CM

## 2015-11-01 DIAGNOSIS — F0781 Postconcussional syndrome: Secondary | ICD-10-CM | POA: Insufficient documentation

## 2015-11-01 DIAGNOSIS — R42 Dizziness and giddiness: Secondary | ICD-10-CM | POA: Diagnosis not present

## 2015-11-01 DIAGNOSIS — R51 Headache: Secondary | ICD-10-CM

## 2015-11-01 NOTE — Progress Notes (Signed)
[facility] Clinic Note - Initial Visit   Date: 11/01/15  Regina [name] MRN: [id number] DOB: Nov 03, 1965   Dear Dr. Alain Marion:  Thank you for your kind referral of Regina [name] for consultation of headaches and dizziness. Although her history is well known to you, please allow [place] to reiterate it for the purpose of our medical record. The patient was accompanied to the clinic by husband who also provides collateral information.     History of Present Illness: Regina [name] is a 50 y.o. Caucasian female with atrial fibrillation, chronic fatigue syndrome, fibromyalgia, vitamin B12 deficiency, GERD,  presenting for evaluation of headaches and dizziness.  She was also referred for cervical vertebra fracture and right shoulder pain, but these are both being actively managed by orthopeadics and sports medicine, respectively.    Patient suffered a 30 foot fall from a treehouse ladder on June 29, 2015 resulting in an acute epidural hemorrhage, slightly displaced fracture of the right frontal parietal bone, and a stable posterior fracture of the lamina at C6 bilaterally.  She also has nondisplaced fractures at T3-T7. She did not have any loss of consciousness.  She has been seeing orthopeadics at [facility] for this and her last note dated 10/24/2015 was reviewed. The notes states that they are weaning her off the brace, starting PT, and managing her work restrictions.  Since her brace has been removed, she is not sleeping well and has right sided ear discomfort.  She was initially referred for right shoulder pain, but in the interim has seen Dr. Hulan Saas, whose note indicates that she may have had a small rotator cuff tear and labral tear. She also has a clavicle fracture that is healing slowly.   She last saw Dr. Bridgette Habermann, neurosurgeon, at [facility] in May who repeated her CT scans which showed complete resolution of intracranial blood products and released her to  orthopeadics for her spine issues.    Since her injury, she has been experiencing intermittent and very brief spells of dizziness. She reports having dizziness, described as if she is spinning.  She has no nausea or vomiting.  It lasts about 10-15 seconds.    It first occurred while hospitalized and was exacerbated by laying flat. Over the past few months, symptoms have become more frequent and now can be triggered by sitting up or turning, but again only lasts a few seconds.  She has tried meclizine, but this does not help.  She also complains of right facial discomfort, as if it feels different "facial paralysis" but this has improved some.  She has not had MRI brain.  Sometimes her words are slurred, but is not noticeable.  She also complains of having a chronic headache, described as a dull ache, lasting all day and occurring nearly every day.  She reports being in a thoracic brace for four month and has been using a neck brace since July.  Her brace was removed on 7/27 and she started physical therapy this week.    Out-side paper records, electronic medical record, and images have been reviewed where available and summarized as:  CT cervical spine and head 06/29/2015: 1. Acute epidural hemorrhage overlying the right frontoparietal lobe, measuring 9 mm greatest thickness with slight mass effect on the underlying frontoparietal lobe parenchyma. No appreciable midline shift. 2. Slightly displaced fracture of the right frontoparietal bone, at the site of the acute epidural hemorrhage described above. Scalp hematoma overlies this right frontoparietal bone fracture.  3. Minimally displaced, obliquely oriented fracture involving the bilateral posterior lamina of C6. This configuration suggests stable fracture. No fracture extension into the C6 facets or pedicles. No vertebral body fracture at this or any other level. Remainder of the cervical spine appears intact and normally aligned.   Past Medical  History:  Diagnosis Date  . Allergic urticaria   . ARTHRALGIA   . Arthritis   . Atrial fibrillation ([place])   . B12 DEFICIENCY   . CFS (chronic fatigue syndrome)   . Endometriosis   . Epidural hemorrhage with loss of consciousness ([place]) 06/29/15  . Fibroid   . Fibromyalgia   . Fracture of cervical vertebra, C6 ([place]) 06/29/15  . Gastritis   . GERD   . Heart murmur   . History of endometriosis   . IBS (irritable bowel syndrome)   . Internal hemorrhoids   . MVP (mitral valve prolapse)   . Osteoarthritis   . Rosacea   . Thoracic compression fracture ([place]) 06/29/15   T2-T6    Past Surgical History:  Procedure Laterality Date  . ABDOMINAL HYSTERECTOMY     partial  . ABDOMINAL HYSTERECTOMY    . ANAL FISSURE REPAIR     Dr Rise [name] 2009  . APPENDECTOMY    . COLPOSCOPY    . HEMORRHOID SURGERY     Dr. Rise [name] 2009  . OVARIAN CYST REMOVAL    . RT BUNIONECTOMT    . TONSILLECTOMY AND ADENOIDECTOMY    . TUBAL LIGATION    . UTERINE FIBROID SURGERY       Medications:  Outpatient Encounter Prescriptions as of 11/01/2015  Medication Sig  . Acetaminophen-Codeine (TYLENOL/CODEINE #3) 300-30 MG tablet Take 0.5-1 tablets by mouth every 4 (four) hours as needed for pain.  Marland Kitchen CASCARA SAGRADA PO Take by mouth.  . Cholecalciferol (GNP VITAMIN D) 1000 units tablet Take 2 tablets (2,000 Units total) by mouth daily.  . cyanocobalamin (,VITAMIN B-12,) 1000 MCG/ML injection Inject 1 mL (1,000 mcg total) into the skin every 14 (fourteen) days.  . ergocalciferol (VITAMIN D2) 50000 units capsule Take 1 capsule (50,000 Units total) by mouth once a week.  . meclizine (ANTIVERT) 12.5 MG tablet Take 1 tablet (12.5 mg total) by mouth 3 (three) times daily as needed for dizziness.  . Phosphatidylserine-DHA-EPA (VAYACOG) 100-19.5-6.5 MG CAPS Take 1 capsule by mouth daily.  . Turmeric 500 MG TABS Take by mouth 2 (two) times daily.  . vitamin B-12 (CYANOCOBALAMIN) 1000 MCG tablet Take 1,000 mcg by mouth daily.    . [DISCONTINUED] Biotin 5000 MCG TABS As  Directed  . [DISCONTINUED] Vitamin D, Ergocalciferol, (DRISDOL) 50000 units CAPS capsule Take 1 capsule (50,000 Units total) by mouth every 7 (seven) days.  . SYRINGE-NEEDLE, DISP, 3 ML (BD ECLIPSE SYRINGE) 25G X 1" 3 ML MISC Use IM (Patient not taking: Reported on 11/01/2015)   No facility-administered encounter medications on file as of 11/01/2015.      Allergies:  Allergies  Allergen Reactions  . Indocin [Indomethacin] Swelling, Rash and Other (See Comments)    REACTION: lupus like illness; butterfly rash, pain and swelling all over body.   . Morphine Sulfate Nausea And Vomiting  . Sulfacetamide Sodium Swelling  . Caffeine Other (See Comments)    Induces A-fib  . Morphine And Related     nausea  . Sulfa Antibiotics Swelling  . Azithromycin Rash    ALL MYCIN DRUGS  . Erythromycin Stearate Hives and Swelling    REACTION: all mycins  Family History: Family History  Problem Relation Age of Onset  . Lung cancer Mother   . Thyroid cancer Mother   . Cancer Mother     INTESTINAL  . Healthy Father   . Diabetes      uncles/aunts  . Von Willebrand disease Daughter   . Asthma Neg Hx     Social History: Social History  Substance Use Topics  . Smoking status: Former Smoker    Quit date: 10/31/1988  . Smokeless tobacco: Never Used  . Alcohol use 0.6 - 1.2 oz/week    1 - 2 Standard drinks or equivalent per week     Comment: occasional    Social History   Social History Narrative   ** Merged History Encounter **       Working 2 jobs, 6d/wk      Regular exercise-no      Divorced    Review of Systems:  CONSTITUTIONAL: No fevers, chills, night sweats, or weight loss.   EYES: No visual changes or eye pain ENT: No hearing changes.  No history of nose bleeds.   RESPIRATORY: No cough, wheezing and shortness of breath.   CARDIOVASCULAR: Negative for chest pain, and palpitations.   GI: Negative for abdominal discomfort, blood in  stools or black stools.  No recent change in bowel habits.   GU:  No history of incontinence.   MUSCLOSKELETAL: + history of joint pain or swelling.  + myalgias.   SKIN: Negative for lesions, rash, and itching.   HEMATOLOGY/ONCOLOGY: Negative for prolonged bleeding, bruising easily, and swollen nodes.  No history of cancer.   ENDOCRINE: Negative for cold or heat intolerance, polydipsia or goiter.   PSYCH:  +depression or anxiety symptoms.   NEURO: As Above.   Vital Signs:  BP 110/74 (BP Location: Right Arm, Patient Position: Sitting, Cuff Size: Normal)   Pulse 80   Ht 5\' 4"  (1.626 m)   Wt 167 lb (75.8 kg)   BMI 28.67 kg/m    General Medical Exam:  General:  Well appearing, comfortable.   Eyes/ENT: see cranial nerve examination.   Neck: No masses appreciated.  Poor neck range of motion.  No carotid bruits. Respiratory:  Clear to auscultation, good air entry bilaterally.   Cardiac:  Regular rate and rhythm, no murmur.   Extremities:  No deformities, edema, or skin discoloration.  Skin:  No rashes or lesions.  Neurological Exam: MENTAL STATUS including orientation to time, place, person, recent and remote memory, attention span and concentration, language, and fund of knowledge is normal.  Speech is not dysarthric.  CRANIAL NERVES: II:  No visual field defects.  Unremarkable fundi.   III-IV-VI: Pupils equal round and reactive to light.  Normal conjugate, extra-ocular eye movements in all directions of gaze.  No nystagmus.  No ptosis.   V:  Normal facial sensation.     VII:  Normal facial symmetry and movements. She is mild difficulty maintaining air in cheeks bilaterally. Otherwise, facial muscles including oribicularis oris is 5/5 VIII:  Normal hearing and vestibular function.   IX-X:  Normal palatal movement.   XI:  Normal shoulder shrug and head rotation.   XII:  Normal tongue strength and range of motion, no deviation or fasciculation.  MOTOR:  No atrophy, fasciculations or  abnormal movements.  No pronator drift.  Tone is normal.    Right Upper Extremity:    Left Upper Extremity:    Deltoid  5/5   Deltoid  5/5   Biceps  5/5   Biceps  5/5   Triceps  5/5   Triceps  5/5   Wrist extensors  5/5   Wrist extensors  5/5   Wrist flexors  5/5   Wrist flexors  5/5   Finger extensors  5/5   Finger extensors  5/5   Finger flexors  5/5   Finger flexors  5/5   Dorsal interossei  5/5   Dorsal interossei  5/5   Abductor pollicis  5/5   Abductor pollicis  5/5   Tone (Ashworth scale)  0  Tone (Ashworth scale)  0   Right Lower Extremity:    Left Lower Extremity:    Hip flexors  5/5   Hip flexors  5/5   Hip extensors  5/5   Hip extensors  5/5   Knee flexors  5/5   Knee flexors  5/5   Knee extensors  5/5   Knee extensors  5/5   Dorsiflexors  5/5   Dorsiflexors  5/5   Plantarflexors  5/5   Plantarflexors  5/5   Toe extensors  5/5   Toe extensors  5/5   Toe flexors  5/5   Toe flexors  5/5   Tone (Ashworth scale)  0  Tone (Ashworth scale)  0   MSRs:  Right                                                                 Left brachioradialis 2+  brachioradialis 2+  biceps 2+  biceps 2+  triceps 2+  triceps 2+  patellar 3+  patellar 3+  ankle jerk 2+  ankle jerk 2+  Hoffman no  Hoffman no  plantar response down  plantar response down   SENSORY:  Normal and symmetric perception of light touch, pinprick, vibration, and proprioception.     COORDINATION/GAIT: Normal finger-to- nose-finger and heel-to-shin.  Intact rapid alternating movements bilaterally.  Able to rise from a chair without using arms.  Gait narrow based and stable. Tandem and stressed gait intact.    IMPRESSION: 1.  Dizziness exacerbated by positional changes is more suggestive of a inner ear dysfunction.  However, other possibilities include post-concussion syndrome vs cervicogenic headaches.  Less likely is cervical dissection, but again, with her traumatic fall and dizziness, ultrasound of the carotids can  be ordered.   I offered to start amitriptyline which would be helpful for both post-concussion syndrome and headaches, but she would like to see her ENT specialist first.    2.  Episodic slurred speech, facial weakness, and headaches in the setting of normal exam.  Explained that my suspicion for anything worrisome causing symptoms is low.  It is reassuring that her last CT showed resolution of blood products.  Certainly, MRI brain can be performed if symptoms do not improve.  Headaches are most likely due to muscle spasm from wearing her neck brace for 4 months and should improve with physical therapy.  3.  Right shoulder pain seems to be more musculoskeletal in nature and is followed by Dr. Tamala Julian.  If she develops new paresthesias, NCS/EMG would be the next step.  Her MRI cervical spine does not show any neural impingement.  4.  Stable C6 vertebral lamina fracture and thoracic T3-T7 fractures are followed by Dr. Clovis Cao  at the [facility] and will make decisions when she can return to work.   The duration of this appointment visit was 50 minutes of face-to-face time with the patient.  Greater than 50% of this time was spent in counseling, explanation of diagnosis, planning of further management, and coordination of care.   Thank you for allowing me to participate in patient's care.  If I can answer any additional questions, I would be pleased to do so.    Sincerely,    Donika K. Posey Pronto, DO

## 2015-11-01 NOTE — Patient Instructions (Addendum)
Call my office if you would like to have [place] order MRI brain and US carotids or start the medication for concussion (amitriptyline)

## 2015-11-12 ENCOUNTER — Ambulatory Visit: Payer: BC Managed Care – PPO | Admitting: Internal Medicine

## 2015-11-15 DIAGNOSIS — IMO0002 Reserved for concepts with insufficient information to code with codable children: Secondary | ICD-10-CM | POA: Insufficient documentation

## 2015-11-19 NOTE — Progress Notes (Signed)
[name] [address] [place], [place] [address] Phone: [phone] Subjective:    I'm seeing this patient by the request  of:  Walker Kehr, MD  CC: Right shoulder pain Follow-up  [id number]  Regina [name] is a 50 y.o. female coming in with complaint of right shoulder pain. Patient has worsening back in April 2017 and have a fall causing a in acute epidural hemorrhage as well as a slightly displaced fracture of the right frontal parietal bone and a posterior fracture of the lamina at C6.  Patient's other imaging also had a left transverse process nondisplaced fractures at T3-T7. Patient has been following up with orthopedics and neurology. Patient states She continues to have right shoulder pain. Patient did see me and had a distal clavicle fracture. Had delayed healing. Patient also had what appeared to be a labral tear. Patient was to start with conservative therapy, home exercises, and once weekly vitamin D. Patient states knee severe sharp pain may be better on the anterior aspect that shoulder but the shoulder itself better. Still certain range of motion seems to be worsening pain. Can have some mild radiation going down the arm. States that she feels that it is somewhat weak. Does not know how much association is to the neck. Patient feels that her neck pain is worse on the left side.  Patient has been also complaining of right wrist pain. Was having significant pain over the TFCC. Patient states that she is having more and more limited range of motion and more severe pain overall.      Past Medical History:  Diagnosis Date  . Allergic urticaria   . ARTHRALGIA   . Arthritis   . Atrial fibrillation ([place])   . B12 DEFICIENCY   . CFS (chronic fatigue syndrome)   . Endometriosis   . Epidural hemorrhage with loss of consciousness ([place]) 06/29/15  . Fibroid   . Fibromyalgia   . Fracture of cervical vertebra, C6 ([place]) 06/29/15  . Gastritis   . GERD    . Heart murmur   . History of endometriosis   . IBS (irritable bowel syndrome)   . Internal hemorrhoids   . MVP (mitral valve prolapse)   . Osteoarthritis   . Rosacea   . Thoracic compression fracture ([place]) 06/29/15   T2-T6   Past Surgical History:  Procedure Laterality Date  . ABDOMINAL HYSTERECTOMY     partial  . ABDOMINAL HYSTERECTOMY    . ANAL FISSURE REPAIR     Dr Rise [name] 2009  . APPENDECTOMY    . COLPOSCOPY    . HEMORRHOID SURGERY     Dr. Rise [name] 2009  . OVARIAN CYST REMOVAL    . RT BUNIONECTOMT    . TONSILLECTOMY AND ADENOIDECTOMY    . TUBAL LIGATION    . UTERINE FIBROID SURGERY     Social History   Social History  . Marital status: Married    Spouse name: N/A  . Number of children: N/A  . Years of education: N/A   Social History Main Topics  . Smoking status: Former Smoker    Quit date: 10/31/1988  . Smokeless tobacco: Never Used  . Alcohol use 0.6 - 1.2 oz/week    1 - 2 Standard drinks or equivalent per week     Comment: occasional   . Drug use: No  . Sexual activity: Yes    Partners: Male   Other Topics Concern  . None   Social History  Narrative   ** Merged History Encounter **       Working 2 jobs, 6d/wk      Regular exercise-no      Divorced   Allergies  Allergen Reactions  . Indocin [Indomethacin] Swelling, Rash and Other (See Comments)    REACTION: lupus like illness; butterfly rash, pain and swelling all over body.   . Morphine Sulfate Nausea And Vomiting  . Sulfacetamide Sodium Swelling  . Caffeine Other (See Comments)    Induces A-fib  . Morphine And Related     nausea  . Sulfa Antibiotics Swelling  . Azithromycin Rash    ALL MYCIN DRUGS  . Erythromycin Stearate Hives and Swelling    REACTION: all mycins   Family History  Problem Relation Age of Onset  . Lung cancer Mother   . Thyroid cancer Mother   . Cancer Mother     INTESTINAL  . Healthy Father   . Diabetes      uncles/aunts  . Von Willebrand disease Daughter     . Asthma Neg Hx     Past medical history, social, surgical and family history all reviewed in electronic medical record.  No pertanent information unless stated regarding to the chief complaint.   Review of Systems: No headache, visual changes, nausea, vomiting, diarrhea, constipation, dizziness, abdominal pain, skin rash, fevers, chills, night sweats, weight loss, swollen lymph nodes, body aches, joint swelling, muscle aches, chest pain, shortness of breath, mood changes.   Objective  Blood pressure 102/70, pulse 63, weight 166 lb (75.3 kg), SpO2 97 %.  General: No apparent distress alert and oriented x3 mood and affect normal, dressed appropriately.  HEENT: Pupils equal, extraocular movements intact  Respiratory: Patient's speak in full sentences and does not appear short of breath  Cardiovascular: No lower extremity edema, non tender, no erythema  Skin: Warm dry intact with no signs of infection or rash on extremities or on axial skeleton.  Abdomen: Soft nontender  Neuro: Cranial nerves II through XII are intact, neurovascularly intact in all extremities with 2+ DTRs and 2+ pulses.  Lymph: No lymphadenopathy of posterior or anterior cervical chain or axillae bilaterally.  Gait normal with good balance and coordination.  MSK:  Non tender with full range of motion and good stability and symmetric strength and tone of  elbows, wrist, hip, knee and ankles bilaterally.   Neck: Inspection unremarkable. No palpable stepoffs. Did not test Spurling secondary to history of fracture Improvement in range of motion but still lacking the last 10 of rotation and side bending bilaterally. Grip strength and sensation normal in bilateral hands Strength good C4 to T1 distribution No sensory change to C4 to T1 Negative Hoffman sign bilaterally Reflexes normal  Wrist: Right Inspection normal with no visible erythema or swelling. Unable to do full range of motion secondary to pain. Severe  tenderness over the TFCC Noted again today. No snuffbox tenderness. No tenderness over Canal of [place]. Strength 5/5 in all directions without pain. Negative Finkelstein, tinel's and phalens. Positive Watson's test.   Shoulder: Right Inspection reveals no abnormalities, atrophy or asymmetry. Severe tenderness to palpation over the distal clavicle near the acromial clavicular joint ROM is full in all planes passively. Rotator cuff strength normal throughout. signs of impingement with positive Neer and Hawkin's tests, but negative empty can sign. Minor improvement from previous exam. Speeds and Yergason's tests normal. Positive O'Brien's Normal scapular function observed. No painful arc and no drop arm sign. No apprehension sign Contralateral shoulder unremarkable  MSK US performed of: Right This study was ordered, performed, and interpreted by Charlann Boxer D.O.  Shoulder:   Supraspinatus:  Appears normal on long and transverse views, appear stem patient may have had a tear that seems to be healing appropriately. Infraspinatus:  Appears normal on long and transverse views. Significant increase in Doppler flow Subscapularis:  Appears normal on long and transverse views. Positive bursa Teres Minor:  Appears normal on long and transverse views. AC joint:    area of callus formation noted has reabsorption at this time. Bone is mostly healed at this time. Minimal hypoechoic changes and no increasing Doppler flow.Glenohumeral Joint:  Appears normal without effusion. Glenoid Labrum:  Scarring noted. Decrease in Doppler flow from previous exam. Biceps Tendon:  Appears normal on long and transverse views, no fraying of tendon, tendon located in intertubercular groove, no subluxation with shoulder internal or external rotation.  Impression: Healed bone at this time. Continue labral pathology      Impression and Recommendations:     This case required medical decision making of moderate  complexity.      Note: This dictation was prepared with Dragon dictation along with smaller phrase technology. Any transcriptional errors that result from this process are unintentional.

## 2015-11-20 ENCOUNTER — Ambulatory Visit (INDEPENDENT_AMBULATORY_CARE_PROVIDER_SITE_OTHER)
Admission: RE | Admit: 2015-11-20 | Discharge: 2015-11-20 | Disposition: A | Discharge status: Home / Self Care | Payer: BC Managed Care – PPO | Source: Ambulatory Visit | Attending: Family Medicine | Admitting: Family Medicine

## 2015-11-20 ENCOUNTER — Encounter: Payer: Self-pay | Admitting: Family Medicine

## 2015-11-20 ENCOUNTER — Ambulatory Visit (INDEPENDENT_AMBULATORY_CARE_PROVIDER_SITE_OTHER): Payer: BC Managed Care – PPO | Admitting: Family Medicine

## 2015-11-20 ENCOUNTER — Ambulatory Visit: Payer: Self-pay

## 2015-11-20 VITALS — BP 102/70 | HR 63 | Wt 166.0 lb

## 2015-11-20 DIAGNOSIS — M25511 Pain in right shoulder: Secondary | ICD-10-CM

## 2015-11-20 DIAGNOSIS — S42001A Fracture of unspecified part of right clavicle, initial encounter for closed fracture: Secondary | ICD-10-CM

## 2015-11-20 DIAGNOSIS — M25531 Pain in right wrist: Secondary | ICD-10-CM

## 2015-11-20 DIAGNOSIS — S12591S Other nondisplaced fracture of sixth cervical vertebra, sequela: Secondary | ICD-10-CM | POA: Diagnosis not present

## 2015-11-20 NOTE — Assessment & Plan Note (Signed)
Healed at this time. No significant disc of the otherwise. I think that she will do well with conservative therapy. More of a labral pathology and some mild rotator cuff syndrome that's likely contribute in.

## 2015-11-20 NOTE — Patient Instructions (Addendum)
Good to see you We will give the shoulder another 4 weeks We need an xray today of wrist and then we will need the MR-Arthrogram to see what is going on.  Avoid heavy lifting.  I think I want you to stop going to physical therapy for the shoulder and only do the home exercises 2 times a week.  Ice is still your friend COntinue the vitamin D and up to you on the turmeric  See me again in 4 weeks and if not better we should at least try to inject the shoulder  For the knee new exercises at home 3 times a week.

## 2015-11-20 NOTE — Assessment & Plan Note (Signed)
Concern for potential TFCC tear. Having more limited range of motion. Pain tender over palpation in the area. I do believe that further advanced imaging is warranted including an MRI arthrogram further evaluate. Patient is having this affect daily activities and patient feels like she is weakening

## 2015-11-20 NOTE — Assessment & Plan Note (Signed)
Discussed with patient at great length. I do feel that patient is having more of an impingement syndrome. Likely also some labral pathology. We discussed the importance of potential injection which patient declined. I believe the patient is doing some overuse with her going to formal physical therapy 4-5 times a week right now for her neck, back, as well as shoulder. We will stop this and have her only do the daily activities 2 times a week. We discussed icing regimen. Patient felt that topical anti-inflammatories were not helpful. Patient will try another 4 weeks of conservative therapy and if worsening symptoms we'll consider injection.

## 2015-11-20 NOTE — Assessment & Plan Note (Signed)
Being followed by neurosurgery. We discussed that we may need an EMG to see if there is any radicular symptoms from the neck that is causing the shoulder pain. Patient was to hold at this time as well.

## 2015-12-06 ENCOUNTER — Ambulatory Visit
Admission: RE | Admit: 2015-12-06 | Discharge: 2015-12-06 | Disposition: A | Discharge status: Home / Self Care | Payer: BC Managed Care – PPO | Source: Ambulatory Visit | Attending: Family Medicine | Admitting: Family Medicine

## 2015-12-06 ENCOUNTER — Ambulatory Visit: Payer: BC Managed Care – PPO | Admitting: Internal Medicine

## 2015-12-06 ENCOUNTER — Inpatient Hospital Stay: Admission: RE | Admit: 2015-12-06 | Payer: BC Managed Care – PPO | Source: Ambulatory Visit

## 2015-12-06 DIAGNOSIS — M25531 Pain in right wrist: Secondary | ICD-10-CM

## 2015-12-06 MED ORDER — IOPAMIDOL (ISOVUE-M 200) INJECTION 41%
1.0000 mL | Freq: Once | INTRAMUSCULAR | Status: AC
  Administered 2015-12-06: 1 mL via INTRA_ARTICULAR

## 2015-12-11 ENCOUNTER — Encounter: Payer: Self-pay | Admitting: Family Medicine

## 2015-12-11 ENCOUNTER — Ambulatory Visit (INDEPENDENT_AMBULATORY_CARE_PROVIDER_SITE_OTHER): Payer: BC Managed Care – PPO | Admitting: Family Medicine

## 2015-12-11 DIAGNOSIS — M797 Fibromyalgia: Secondary | ICD-10-CM

## 2015-12-11 DIAGNOSIS — M25512 Pain in left shoulder: Secondary | ICD-10-CM | POA: Diagnosis not present

## 2015-12-11 DIAGNOSIS — M25511 Pain in right shoulder: Secondary | ICD-10-CM | POA: Insufficient documentation

## 2015-12-11 DIAGNOSIS — S12591G Other nondisplaced fracture of sixth cervical vertebra, subsequent encounter for fracture with delayed healing: Secondary | ICD-10-CM | POA: Diagnosis not present

## 2015-12-11 MED ORDER — GABAPENTIN 100 MG PO CAPS: 200.0000 mg | ORAL_CAPSULE | Freq: Every day | ORAL | 3 refills | Status: DC

## 2015-12-11 NOTE — Assessment & Plan Note (Signed)
Patient tolerated the procedure well. We discussed icing regimen and home exercises. Started on gabapentin to see if this will be beneficial. Differential includes a reflex sympathetic dystrophy as well as patient's underlying fibromyalgia that could be contribute in. Patient will follow-up and see me again in 4 weeks for further evaluation.

## 2015-12-11 NOTE — Patient Instructions (Addendum)
Good to see you  Sorry I do not have an answer We can consider EMG to map the nerve if we need Tried trigger points in the left shoulder region today  I think another 4 weeks of pT could be helpful and I will send in anything we need.  Ice is still your friend Start gabapentin 200mg  at night.  Continue the other medications includiong the vitmain D See me again in 3-4 weeks.

## 2015-12-11 NOTE — Progress Notes (Signed)
[name] [place], [name] [address] Phone: [phone] Subjective:    I'm seeing this patient by the request  of:  Walker Kehr, MD  CC: Right shoulder pain Follow-up Neck pain follow-up,  [id number]  AMELINDA SIEJA is a 50 y.o. female coming in with complaint of right shoulder pain. Patient has worsening back in April 2017 and have a fall causing a in acute epidural hemorrhage as well as a slightly displaced fracture of the right frontal parietal bone and a posterior fracture of the lamina at C6.  Patient's other imaging also had a left transverse process nondisplaced fractures at T3-T7. Patient has been following up with orthopedics and neurology. She states that they feel that patient is now near maximal improvement. Patient is very frustrated by this.  Patient states She continues to have right shoulder pain. Patient did see me and had a distal clavicle fracture. Had completely healed dome on ultrasound at last follow-up. Patient states that she is now having more pain at the base of the neck on the left side. Patient states that this is what is contributing to most of her pain during the day and at night. Something that is still continuing to affect her from working regularly. Patient feels that her nerves or just irritated all the time. Causing her to be side as well. Less radiation down the extremities and she's had previously. Seems to be more localized.  Patient has been also complaining of right wrist pain. Was having significant pain over the TFCC. Patient was having worsening symptoms. Patient was sent for an MR arthrogram of the right wrist. This was independently visualized by me and only showed some very mild degenerative changes but no true TFCC tear.      Past Medical History:  Diagnosis Date  . Allergic urticaria   . ARTHRALGIA   . Arthritis   . Atrial fibrillation ([place])   . B12 DEFICIENCY   . CFS (chronic fatigue  syndrome)   . Endometriosis   . Epidural hemorrhage with loss of consciousness ([facility]) 06/29/15  . Fibroid   . Fibromyalgia   . Fracture of cervical vertebra, C6 ([facility]) 06/29/15  . Gastritis   . GERD   . Heart murmur   . History of endometriosis   . IBS (irritable bowel syndrome)   . Internal hemorrhoids   . MVP (mitral valve prolapse)   . Osteoarthritis   . Rosacea   . Thoracic compression fracture ([facility]) 06/29/15   T2-T6   Past Surgical History:  Procedure Laterality Date  . ABDOMINAL HYSTERECTOMY     partial  . ABDOMINAL HYSTERECTOMY    . ANAL FISSURE REPAIR     Dr Rise [name] 2009  . APPENDECTOMY    . COLPOSCOPY    . HEMORRHOID SURGERY     Dr. Rise [name] 2009  . OVARIAN CYST REMOVAL    . RT BUNIONECTOMT    . TONSILLECTOMY AND ADENOIDECTOMY    . TUBAL LIGATION    . UTERINE FIBROID SURGERY     Social History   Social History  . Marital status: Married    Spouse name: N/A  . Number of children: N/A  . Years of education: N/A   Social History Main Topics  . Smoking status: Former Smoker    Quit date: 10/31/1988  . Smokeless tobacco: Never Used  . Alcohol use 0.6 - 1.2 oz/week    1 - 2 Standard drinks or equivalent per week  Comment: occasional   . Drug use: No  . Sexual activity: Yes    Partners: Male   Other Topics Concern  . None   Social History Narrative   ** Merged History Encounter **       Working 2 jobs, 6d/wk      Regular exercise-no      Divorced   Allergies  Allergen Reactions  . Indocin [Indomethacin] Swelling, Rash and Other (See Comments)    REACTION: lupus like illness; butterfly rash, pain and swelling all over body.   . Morphine Sulfate Nausea And Vomiting  . Sulfacetamide Sodium Swelling  . Caffeine Other (See Comments)    Induces A-fib  . Morphine And Related     nausea  . Sulfa Antibiotics Swelling  . Azithromycin Rash    ALL MYCIN DRUGS  . Erythromycin Stearate Hives and Swelling    REACTION: all mycins   Family History    Problem Relation Age of Onset  . Lung cancer Mother   . Thyroid cancer Mother   . Cancer Mother     INTESTINAL  . Healthy Father   . Diabetes      uncles/aunts  . Von Willebrand disease Daughter   . Asthma Neg Hx     Past medical history, social, surgical and family history all reviewed in electronic medical record.  No pertanent information unless stated regarding to the chief complaint.   Review of Systems: No headache, visual changes, nausea, vomiting, diarrhea, constipation, dizziness, abdominal pain, skin rash, fevers, chills, night sweats, weight loss, swollen lymph nodes, body aches, joint swelling, muscle aches, chest pain, shortness of breath, mood changes.   Objective  Blood pressure 120/70, pulse 62, SpO2 99 %.  General: No apparent distress alert and oriented x3 mood and affect normal, dressed appropriately.  HEENT: Pupils equal, extraocular movements intact  Respiratory: Patient's speak in full sentences and does not appear short of breath  Cardiovascular: No lower extremity edema, non tender, no erythema  Skin: Warm dry intact with no signs of infection or rash on extremities or on axial skeleton.  Abdomen: Soft nontender  Neuro: Cranial nerves II through XII are intact, neurovascularly intact in all extremities with 2+ DTRs and 2+ pulses.  Lymph: No lymphadenopathy of posterior or anterior cervical chain or axillae bilaterally.  Gait normal with good balance and coordination.  MSK:  Non tender with full range of motion and good stability and symmetric strength and tone of  elbows, wrist, hip, knee and ankles bilaterally.   Neck: Inspection unremarkable. No palpable stepoffs. Did not test Spurling secondary to history of fracture Improvement in range of motion but still lacking the last 10 of rotation and side bending bilaterally.Worse on the left than the right side. Grip strength and sensation normal in bilateral hands Strength good C4 to T1 distribution No  sensory change to C4 to T1 Negative Hoffman sign bilaterally Reflexes normal Significant muscle spasm of the left trapezius with multiple trigger points noted.  Wrist: Right Inspection normal with no visible erythema or swelling. Unable to do full range of motion secondary to pain. Continued tenderness with stressing of the TFCC No snuffbox tenderness. No tenderness over Canal of Guyon. Strength 5/5 in all directions without pain. Negative Finkelstein, tinel's and phalens. Continued pain with Shon Baton test  After verbal consent patient was prepped with alcohol swabs and with a 25-gauge half-inch no was injected with a total of 3 mL of 0.5% Marcaine and 1 mL of Kenalog 40 mg/dL into  the left trapezius muscle at 3 distinct trigger points.     Impression and Recommendations:     This case required medical decision making of moderate complexity.      Note: This dictation was prepared with Dragon dictation along with smaller phrase technology. Any transcriptional errors that result from this process are unintentional.

## 2015-12-11 NOTE — Assessment & Plan Note (Signed)
I believe the patient's fracture of the vertebrae C6 is likely still contributed to some of the discomfort. We discussed with patient that she is now 5 months out from the injury. Encourage her to continue with the physical therapy. Tried some trigger point injections in the left trapezius area. We discussed icing regimen, home exercises. We discussed the possibility of an EMG of bilateral upper extremities if necessary. Started on gabapentin

## 2015-12-13 ENCOUNTER — Encounter: Payer: Self-pay | Admitting: Internal Medicine

## 2015-12-13 ENCOUNTER — Ambulatory Visit (INDEPENDENT_AMBULATORY_CARE_PROVIDER_SITE_OTHER): Payer: BC Managed Care – PPO | Admitting: Internal Medicine

## 2015-12-13 DIAGNOSIS — E538 Deficiency of other specified B group vitamins: Secondary | ICD-10-CM | POA: Diagnosis not present

## 2015-12-13 DIAGNOSIS — R21 Rash and other nonspecific skin eruption: Secondary | ICD-10-CM

## 2015-12-13 DIAGNOSIS — F0781 Postconcussional syndrome: Secondary | ICD-10-CM

## 2015-12-13 MED ORDER — HYDROCORTISONE 2.5 % EX CREA: TOPICAL_CREAM | Freq: Two times a day (BID) | CUTANEOUS | 1 refills | Status: DC

## 2015-12-13 MED ORDER — CYANOCOBALAMIN 1000 MCG/ML IJ SOLN
1000.0000 ug | Freq: Once | INTRAMUSCULAR | Status: AC
  Administered 2015-12-13: 1000 ug via INTRAMUSCULAR

## 2015-12-13 NOTE — Patient Instructions (Signed)
Google gerd wedge pillows

## 2015-12-13 NOTE — Progress Notes (Signed)
Pre visit review using our clinic review tool, if applicable. No additional management support is needed unless otherwise documented below in the visit note. 

## 2015-12-13 NOTE — Assessment & Plan Note (Signed)
discussed

## 2015-12-13 NOTE — Assessment & Plan Note (Signed)
On B12 sq inj

## 2015-12-13 NOTE — Assessment & Plan Note (Signed)
hydroc 2.5% cream

## 2015-12-13 NOTE — Progress Notes (Signed)
Subjective:  Patient ID: Regina [name], female    DOB: 1965-04-20  Age: 50 y.o. MRN: [id number]  CC: No chief complaint on file.   HPI Regina [name] presents for L neck and shoulder pain, LBP, B 12 def f/u. C/o rash  Outpatient Medications Prior to Visit  Medication Sig Dispense Refill  . Acetaminophen-Codeine (TYLENOL/CODEINE #3) 300-30 MG tablet Take 0.5-1 tablets by mouth every 4 (four) hours as needed for pain. 60 tablet 1  . CASCARA SAGRADA PO Take by mouth.    . Cholecalciferol (GNP VITAMIN D) 1000 units tablet Take 2 tablets (2,000 Units total) by mouth daily. 100 tablet 3  . cyanocobalamin (,VITAMIN B-12,) 1000 MCG/ML injection Inject 1 mL (1,000 mcg total) into the skin every 14 (fourteen) days. 10 mL 11  . ergocalciferol (VITAMIN D2) 50000 units capsule Take 1 capsule (50,000 Units total) by mouth once a week. 6 capsule 0  . gabapentin (NEURONTIN) 100 MG capsule Take 2 capsules (200 mg total) by mouth at bedtime. 60 capsule 3  . Phosphatidylserine-DHA-EPA (VAYACOG) 100-19.5-6.5 MG CAPS Take 1 capsule by mouth daily. 30 capsule 11  . SYRINGE-NEEDLE, DISP, 3 ML (BD ECLIPSE SYRINGE) 25G X 1" 3 ML MISC Use IM 50 each 11  . Turmeric 500 MG TABS Take by mouth 2 (two) times daily.    . vitamin B-12 (CYANOCOBALAMIN) 1000 MCG tablet Take 1,000 mcg by mouth daily.     No facility-administered medications prior to visit.     ROS Review of Systems  Constitutional: Positive for fatigue. Negative for activity change, appetite change, chills and unexpected weight change.  HENT: Negative for congestion, mouth sores and sinus pressure.   Eyes: Negative for visual disturbance.  Respiratory: Negative for cough and chest tightness.   Gastrointestinal: Negative for abdominal pain and nausea.  Genitourinary: Negative for difficulty urinating, frequency and vaginal pain.  Musculoskeletal: Positive for arthralgias, back pain, myalgias, neck pain and neck stiffness. Negative for gait problem.   Skin: Negative for pallor and rash.  Neurological: Positive for weakness. Negative for dizziness, tremors, numbness and headaches.  Psychiatric/Behavioral: Positive for decreased concentration. Negative for confusion, sleep disturbance and suicidal ideas. The patient is nervous/anxious.     Objective:  BP 100/70   Pulse 65   Wt 166 lb (75.3 kg)   SpO2 96%   BMI 28.49 kg/m   BP Readings from Last 3 Encounters:  12/13/15 100/70  12/11/15 120/70  11/20/15 102/70    Wt Readings from Last 3 Encounters:  12/13/15 166 lb (75.3 kg)  11/20/15 166 lb (75.3 kg)  11/01/15 167 lb (75.8 kg)    Physical Exam  Constitutional: She appears well-developed. No distress.  HENT:  Head: Normocephalic.  Right Ear: External ear normal.  Left Ear: External ear normal.  Nose: Nose normal.  Mouth/Throat: Oropharynx is clear and moist.  Eyes: Conjunctivae are normal. Pupils are equal, round, and reactive to light. Right eye exhibits no discharge. Left eye exhibits no discharge.  Neck: Normal range of motion. Neck supple. No JVD present. No tracheal deviation present. No thyromegaly present.  Cardiovascular: Normal rate, regular rhythm and normal heart sounds.   Pulmonary/Chest: No stridor. No respiratory distress. She has no wheezes.  Abdominal: Soft. Bowel sounds are normal. She exhibits no distension and no mass. There is no tenderness. There is no rebound and no guarding.  Musculoskeletal: She exhibits tenderness. She exhibits no edema.  Lymphadenopathy:    She has no cervical adenopathy.  Neurological: She displays  normal reflexes. No cranial nerve deficit. She exhibits normal muscle tone. Coordination normal.  Skin: No rash noted. No erythema.  Psychiatric: She has a normal mood and affect. Her behavior is normal. Judgment and thought content normal.    Lab Results  Component Value Date   WBC 7.2 09/11/2015   HGB 13.4 09/11/2015   HCT 39.4 09/11/2015   PLT 385.0 09/11/2015   GLUCOSE 106  (H) 09/11/2015   CHOL 201 (H) 02/26/2015   TRIG 65.0 02/26/2015   HDL 62.10 02/26/2015   LDLCALC 126 (H) 02/26/2015   ALT 18 09/11/2015   AST 19 09/11/2015   NA 138 09/11/2015   K 3.5 09/11/2015   CL 101 09/11/2015   CREATININE 0.62 09/11/2015   BUN 11 09/11/2015   CO2 29 09/11/2015   TSH 1.19 09/11/2015   INR 1.16 06/29/2015    Mr Wrist Right W Contrast  Result Date: 12/06/2015 CLINICAL DATA:  Right wrist pain status post fall. Numbness and weakness. Limited range of motion. EXAM: MRI OF THE RIGHT WRIST WITH CONTRAST(MR Arthrogram) TECHNIQUE: Multiplanar, multisequence MR imaging of the wrist was performed immediately following contrast injection into the radiocarpal joint under fluoroscopic guidance. No intravenous contrast was administered. COMPARISON:  None. FINDINGS: Ligaments: Intact scapholunate and lunotriquetral ligaments. Triangular fibrocartilage: Intact. Tendons: Intact flexor and extensor compartment tendons. Carpal tunnel/median nerve: Normal. Guyon's canal: Normal. Joint/cartilage: Intraarticular contrast in the radiocarpal joint distending the joint capsule. No chondral defect. No intra-articular loose body. Bones/carpal alignment: No marrow signal abnormality. No fracture or dislocation. Normal alignment. Other: No fluid collection or hematoma. IMPRESSION: 1. No internal derangement of the right wrist. Electronically Signed   By: Kathreen Devoid   On: 12/06/2015 17:08   Dg Fluoro Guided Needle Plc Aspiration/injection Loc  Result Date: 12/06/2015 CLINICAL DATA:  Right wrist pain.  20 foot fall 5 months ago. FLUOROSCOPY TIME:  Radiation Exposure Index (as provided by the fluoroscopic device): 0.93 uGy*m2 Fluoroscopy Time:  18 seconds Number of Acquired Images:  0 PROCEDURE: Right WRIST INJECTION UNDER FLUOROSCOPY An appropriate skin entrance site was determined. The site was marked, prepped with Betadine, draped in the usual sterile fashion, and infiltrated locally with 1%  Lidocaine. A 25 gauge skin needle was advanced into the radiocarpal joint under intermittent fluoroscopy. 0.1 mL MultiHance was diluted and 20 mL of Isovue M 200. This was then used to opacify the proximal carpal joint. No immediate complication. IMPRESSION: Technically successful right wrist injection for MRI. Electronically Signed   By: San Morelle M.D.   On: 12/06/2015 16:18    Assessment & Plan:   There are no diagnoses linked to this encounter. I am having Ms. Hallquist maintain her SYRINGE-NEEDLE (DISP) 3 ML, cyanocobalamin, CASCARA SAGRADA PO, Phosphatidylserine-DHA-EPA, ergocalciferol, Acetaminophen-Codeine, Cholecalciferol, vitamin B-12, Turmeric, and gabapentin.  No orders of the defined types were placed in this encounter.    Follow-up: No Follow-up on file.  Walker Kehr, MD

## 2015-12-15 ENCOUNTER — Encounter: Payer: Self-pay | Admitting: Internal Medicine

## 2016-01-06 NOTE — Progress Notes (Signed)
[name] [place], [place] [address] Phone: [phone] Subjective:    I'm seeing this patient by the request  of:  Walker Kehr, MD  CC: Right shoulder pain Follow-up Neck pain follow-up,  [id number]  Regina [name] is a 50 y.o. female coming in with complaint of right shoulder pain. Patient has worsening back in April 2017 and have a fall causing a in acute epidural hemorrhage as well as a slightly displaced fracture of the right frontal parietal bone and a posterior fracture of the lamina at C6.  Patient's other imaging also had a left transverse process nondisplaced fractures at T3-T7.   Patient states She continues to have right shoulder pain. Patient did see me and had a distal clavicle fracture. Had completely healed on ultrasound. Still having pain in 10 some association with the neck.  Continued to have pain in the area. Patient would not try an injection to the acromioclavicular joint but we did try trigger point injections as well as started on a low dose gabapentin. Patient states Injections did help in formal physical therapy has been helping. Still having some right shoulder pain as well as some neck pain. Starting starting have some increase in range of motion of the neck. States that the radiation down the arms do not seem to be as frequent. Denies any new symptoms such as weakness.  Patient has been also complaining of right wrist pain. Was having significant pain over the TFCC. Patient was having worsening symptoms. Patient was sent for an MR arthrogram of the right wrist. This was independently visualized by me and only showed some very mild degenerative changes but no true TFCC tear. Still having pain.      Past Medical History:  Diagnosis Date  . Allergic urticaria   . ARTHRALGIA   . Arthritis   . Atrial fibrillation ([facility])   . B12 DEFICIENCY   . CFS (chronic fatigue syndrome)   . Endometriosis   . Epidural  hemorrhage with loss of consciousness ([facility]) 06/29/15  . Fibroid   . Fibromyalgia   . Fracture of cervical vertebra, C6 ([facility]) 06/29/15  . Gastritis   . GERD   . Heart murmur   . History of endometriosis   . IBS (irritable bowel syndrome)   . Internal hemorrhoids   . MVP (mitral valve prolapse)   . Osteoarthritis   . Rosacea   . Thoracic compression fracture ([facility]) 06/29/15   T2-T6   Past Surgical History:  Procedure Laterality Date  . ABDOMINAL HYSTERECTOMY     partial  . ABDOMINAL HYSTERECTOMY    . ANAL FISSURE REPAIR     Dr Rise [name] 2009  . APPENDECTOMY    . COLPOSCOPY    . HEMORRHOID SURGERY     Dr. Rise [name] 2009  . OVARIAN CYST REMOVAL    . RT BUNIONECTOMT    . TONSILLECTOMY AND ADENOIDECTOMY    . TUBAL LIGATION    . UTERINE FIBROID SURGERY     Social History   Social History  . Marital status: Married    Spouse name: N/A  . Number of children: N/A  . Years of education: N/A   Social History Main Topics  . Smoking status: Former Smoker    Quit date: 10/31/1988  . Smokeless tobacco: Never Used  . Alcohol use 0.6 - 1.2 oz/week    1 - 2 Standard drinks or equivalent per week     Comment: occasional   .  Drug use: No  . Sexual activity: Yes    Partners: Male   Other Topics Concern  . Not on file   Social History Narrative   ** Merged History Encounter **       Working 2 jobs, 6d/wk      Regular exercise-no      Divorced   Allergies  Allergen Reactions  . Indocin [Indomethacin] Swelling, Rash and Other (See Comments)    REACTION: lupus like illness; butterfly rash, pain and swelling all over body.   . Morphine Sulfate Nausea And Vomiting  . Sulfacetamide Sodium Swelling  . Caffeine Other (See Comments)    Induces A-fib  . Morphine And Related     nausea  . Sulfa Antibiotics Swelling  . Azithromycin Rash    ALL MYCIN DRUGS  . Erythromycin Stearate Hives and Swelling    REACTION: all mycins   Family History  Problem Relation Age of Onset  . Lung  cancer Mother   . Thyroid cancer Mother   . Cancer Mother     INTESTINAL  . Healthy Father   . Diabetes      uncles/aunts  . Von Willebrand disease Daughter   . Asthma Neg Hx     Past medical history, social, surgical and family history all reviewed in electronic medical record.  No pertanent information unless stated regarding to the chief complaint.   Review of Systems: No headache, visual changes, nausea, vomiting, diarrhea, constipation, dizziness, abdominal pain, skin rash, fevers, chills, night sweats, weight loss, swollen lymph nodes,chest pain, shortness of breath, mood changes.   Objective  There were no vitals taken for this visit.  General: No apparent distress alert and oriented x3 mood and affect normal, dressed appropriately.  HEENT: Pupils equal, extraocular movements intact  Respiratory: Patient's speak in full sentences and does not appear short of breath  Cardiovascular: No lower extremity edema, non tender, no erythema  Skin: Warm dry intact with no signs of infection or rash on extremities or on axial skeleton.  Abdomen: Soft nontender  Neuro: Cranial nerves II through XII are intact, neurovascularly intact in all extremities with 2+ DTRs and 2+ pulses.  Lymph: No lymphadenopathy of posterior or anterior cervical chain or axillae bilaterally.  Gait normal with good balance and coordination.  MSK:  Non tender with full range of motion and good stability and symmetric strength and tone of  elbows, wrist, hip, knee and ankles bilaterally.   Neck: Inspection unremarkable. No palpable stepoffs. Continue to avoid Spurling Still having limitation in range of motion lacking 10-15 of side bending and rotation bilaterally. Grip strength and sensation normal in bilateral hands Strength good C4 to T1 distribution No sensory change to C4 to T1 Negative Hoffman sign bilaterally Reflexes normal No muscle spasm seen previously.  Wrist: Right Inspection normal with no  visible erythema or swelling. Unable to do full range of motion secondary to pain. Continued tenderness with stressing of the TFCC No snuffbox tenderness. No tenderness over Canal of Guyon. Strength 5/5 in all directions without pain. Negative Finkelstein, tinel's and phalens. Continued pain with Shon Baton test     Impression and Recommendations:     This case required medical decision making of moderate complexity.      Note: This dictation was prepared with Dragon dictation along with smaller phrase technology. Any transcriptional errors that result from this process are unintentional.

## 2016-01-07 ENCOUNTER — Ambulatory Visit (INDEPENDENT_AMBULATORY_CARE_PROVIDER_SITE_OTHER): Payer: BC Managed Care – PPO | Admitting: Family Medicine

## 2016-01-07 ENCOUNTER — Encounter: Payer: Self-pay | Admitting: Family Medicine

## 2016-01-07 DIAGNOSIS — S12591G Other nondisplaced fracture of sixth cervical vertebra, subsequent encounter for fracture with delayed healing: Secondary | ICD-10-CM

## 2016-01-07 DIAGNOSIS — M797 Fibromyalgia: Secondary | ICD-10-CM | POA: Diagnosis not present

## 2016-01-07 DIAGNOSIS — M25511 Pain in right shoulder: Secondary | ICD-10-CM

## 2016-01-07 NOTE — Assessment & Plan Note (Signed)
Think that this is contributing and swelling down the healing process. Discussed the importance of vitamin D supplementation. Patient with continue this.

## 2016-01-07 NOTE — Patient Instructions (Addendum)
Good to see you  You are making progress Keep up with physical therapy another 2 weeks.  Have them fax over any information if they can .  Ice is your friend still  Try the gabapentin 100-200mg  at night regularly for 2 weeks and I think it will help the nerve pain  See me again in 6 weeks.

## 2016-01-07 NOTE — Assessment & Plan Note (Signed)
Patient is improving overall. Continue to work on conservative therapy work with formal physical therapy to help with range of motion. Patient is now down 10 weeks of physical therapy and I do think that in 12 weeks will be necessary. Patient continues to do the home exercises if possible. Encourage her to take the gabapentin if some of the neurologic pathology that could be concerning. Patient knows that EMG could be beneficial as well.

## 2016-01-07 NOTE — Assessment & Plan Note (Signed)
Discussed with patient again at great length. Discussed the possibility of doing an injection in the shoulder for diagnostic as well as therapeutic results. Patient declined this. We discussed icing regimen. Discussed which activities doing which ones to avoid. Patient is worsening symptoms we'll consider injection versus EMG.

## 2016-01-23 ENCOUNTER — Other Ambulatory Visit: Payer: Self-pay | Admitting: *Deleted

## 2016-01-23 MED ORDER — GABAPENTIN 100 MG PO CAPS
200.0000 mg | ORAL_CAPSULE | Freq: Every day | ORAL | 1 refills | Status: DC
Start: 2016-01-23 — End: 2016-02-18

## 2016-01-23 NOTE — Telephone Encounter (Signed)
Refill done.  

## 2016-02-12 ENCOUNTER — Ambulatory Visit: Payer: BC Managed Care – PPO | Admitting: Obstetrics and Gynecology

## 2016-02-12 ENCOUNTER — Encounter: Payer: Self-pay | Admitting: Obstetrics and Gynecology

## 2016-02-17 NOTE — Progress Notes (Signed)
[name] [address] [place], [place] [address] Phone: [phone] Subjective:    I'm seeing this patient by the request  of:  Walker Kehr, MD  CC: Right shoulder pain Follow-up Neck pain follow-up,  [id number]  Regina [name] is a 50 y.o. female coming in with complaint of right shoulder pain. Patient has worsening back in April 2017 and have a fall causing a in acute epidural hemorrhage as well as a slightly displaced fracture of the right frontal parietal bone and a posterior fracture of the lamina at C6.  Patient's other imaging also had a left transverse process nondisplaced fractures at T3-T7.  Continues to have pain overall. Continues to have a dull, throbbing aching sensation. Patient continues to have difficulty with mid back pain.  Continued to have pain in the area. Has had trigger points in the upper shoulder previously. Continues to have difficulty on the left side. Patient's states that he still is undergoing. Patient stopped formal physical therapy secondary to pain.  Patient has been also complaining of right wrist pain. Stable to continues to have pain. Feels that the final myalgias contributing to him pain overall. Patient states that has not made any significant improvement.      Past Medical History:  Diagnosis Date  . Allergic urticaria   . ARTHRALGIA   . Arthritis   . Atrial fibrillation ([facility])   . B12 DEFICIENCY   . CFS (chronic fatigue syndrome)   . Endometriosis   . Epidural hemorrhage with loss of consciousness ([facility]) 06/29/15  . Fibroid   . Fibromyalgia   . Fracture of cervical vertebra, C6 ([facility]) 06/29/15  . Gastritis   . GERD   . Heart murmur   . History of endometriosis   . IBS (irritable bowel syndrome)   . Internal hemorrhoids   . MVP (mitral valve prolapse)   . Osteoarthritis   . Rosacea   . Thoracic compression fracture ([facility]) 06/29/15   T2-T6   Past Surgical History:  Procedure Laterality Date  .  ABDOMINAL HYSTERECTOMY     partial  . ABDOMINAL HYSTERECTOMY    . ANAL FISSURE REPAIR     Dr Rise [name] 2009  . APPENDECTOMY    . COLPOSCOPY    . HEMORRHOID SURGERY     Dr. Rise [name] 2009  . OVARIAN CYST REMOVAL    . RT BUNIONECTOMT    . TONSILLECTOMY AND ADENOIDECTOMY    . TUBAL LIGATION    . UTERINE FIBROID SURGERY     Social History   Social History  . Marital status: Married    Spouse name: N/A  . Number of children: N/A  . Years of education: N/A   Social History Main Topics  . Smoking status: Former Smoker    Quit date: 10/31/1988  . Smokeless tobacco: Never Used  . Alcohol use 0.6 - 1.2 oz/week    1 - 2 Standard drinks or equivalent per week     Comment: occasional   . Drug use: No  . Sexual activity: Yes    Partners: Male   Other Topics Concern  . None   Social History Narrative   ** Merged History Encounter **       Working 2 jobs, 6d/wk      Regular exercise-no      Divorced   Allergies  Allergen Reactions  . Indocin [Indomethacin] Swelling, Rash and Other (See Comments)    REACTION: lupus like illness; butterfly rash, pain and swelling  all over body.   . Morphine Sulfate Nausea And Vomiting  . Sulfacetamide Sodium Swelling  . Caffeine Other (See Comments)    Induces A-fib  . Morphine And Related     nausea  . Sulfa Antibiotics Swelling  . Azithromycin Rash    ALL MYCIN DRUGS  . Erythromycin Stearate Hives and Swelling    REACTION: all mycins   Family History  Problem Relation Age of Onset  . Lung cancer Mother   . Thyroid cancer Mother   . Cancer Mother     INTESTINAL  . Healthy Father   . Diabetes      uncles/aunts  . Von Willebrand disease Daughter   . Asthma Neg Hx     Past medical history, social, surgical and family history all reviewed in electronic medical record.  No pertanent information unless stated regarding to the chief complaint.   Review of Systems: No headache, visual changes, nausea, vomiting, diarrhea,  constipation, dizziness, abdominal pain, skin rash, fevers, chills, night sweats, weight loss, swollen lymph nodes,chest pain, shortness of breath, mood changes.   Objective  Blood pressure 110/70, pulse 69, height 5\' 4"  (1.626 m), weight 164 lb (74.4 kg).  General: No apparent distress alert and oriented x3 mood and affect normal, dressed appropriately.  HEENT: Pupils equal, extraocular movements intact  Respiratory: Patient's speak in full sentences and does not appear short of breath  Cardiovascular: No lower extremity edema, non tender, no erythema  Skin: Warm dry intact with no signs of infection or rash on extremities or on axial skeleton.  Abdomen: Soft nontender  Neuro: Cranial nerves II through XII are intact, neurovascularly intact in all extremities with 2+ DTRs and 2+ pulses.  Lymph: No lymphadenopathy of posterior or anterior cervical chain or axillae bilaterally.  Gait normal with good balance and coordination.  MSK:  Mild tenderness throughout muscular full range of motion and good stability and symmetric strength and tone of  elbows, wrist, hip, knee and ankles bilaterally.   Neck: Inspection unremarkable. No palpable stepoffs. Continue to avoid Spurling Patient continues to have 10 of extension. Grip strength and sensation normal in bilateral hands Strength good C4 to T1 distribution No sensory change to C4 to T1 Negative Hoffman sign bilaterally Reflexes normal Increased tenderness in the trapezius muscle.    Impression and Recommendations:     This case required medical decision making of moderate complexity.      Note: This dictation was prepared with Dragon dictation along with smaller phrase technology. Any transcriptional errors that result from this process are unintentional.

## 2016-02-18 ENCOUNTER — Ambulatory Visit (INDEPENDENT_AMBULATORY_CARE_PROVIDER_SITE_OTHER): Payer: BC Managed Care – PPO | Admitting: Family Medicine

## 2016-02-18 ENCOUNTER — Encounter: Payer: Self-pay | Admitting: Family Medicine

## 2016-02-18 DIAGNOSIS — R51 Headache: Secondary | ICD-10-CM | POA: Diagnosis not present

## 2016-02-18 DIAGNOSIS — S12591G Other nondisplaced fracture of sixth cervical vertebra, subsequent encounter for fracture with delayed healing: Secondary | ICD-10-CM | POA: Diagnosis not present

## 2016-02-18 DIAGNOSIS — M797 Fibromyalgia: Secondary | ICD-10-CM

## 2016-02-18 DIAGNOSIS — G4486 Cervicogenic headache: Secondary | ICD-10-CM

## 2016-02-18 MED ORDER — VENLAFAXINE HCL ER 37.5 MG PO CP24: 37.5000 mg | ORAL_CAPSULE | Freq: Every day | ORAL | 1 refills | Status: DC

## 2016-02-18 MED ORDER — ERGOCALCIFEROL 1.25 MG (50000 UT) PO CAPS: 50000.0000 [IU] | ORAL_CAPSULE | ORAL | 0 refills | Status: DC

## 2016-02-18 NOTE — Assessment & Plan Note (Signed)
Discussed with patient again. I do believe the patient still having some radicular symptoms from this. Likely causing some increasing muscular tension. Patient has declined any type of epidural injections. We'll continue to monitor. I do believe the patient's underlying fibromyalgia is also contribute in. Having side effects to the gabapentin. Will start on x-ray. Patient will continue to be active otherwise. Patient does not want to start with physical therapy. Patient come back again in 4-6 weeks for further evaluation and treatment.  Spent  25 minutes with patient face-to-face and had greater than 50% of counseling including as described above in assessment and plan.

## 2016-02-18 NOTE — Assessment & Plan Note (Signed)
Continues to have pain overall. I do believe that this is contribute in. I do think that there is some underlying depression and anxiety. Patient given the Effexor. Patient come back and see me again in 4 weeks.

## 2016-02-18 NOTE — Patient Instructions (Signed)
Am sorry we have not made too much of a change yet.  Stop the gabapentin or go to 1 pill at night Effexor 37.5 mg daily  I do think some of it is from the fibromyalgia and may be something that needs to be address You be the guide and increase activity  See me again in 4 weeks.

## 2016-02-18 NOTE — Assessment & Plan Note (Signed)
Having difficulty overall. I do believe the patient is doing well but is having trouble with memory joint will discontinue the gabapentin. Patient will continue monitoring for symptoms. I do not feel that further workup is needed. Patient can follow-up with neurologist.

## 2016-03-02 ENCOUNTER — Telehealth: Payer: Self-pay | Admitting: *Deleted

## 2016-03-02 NOTE — Telephone Encounter (Signed)
Patient in 06 recall for 01/2016. Patient no showed appointment 02-12-16. Tried to contact patient to schedule AEX/PAP however VM was full will try again later-eh

## 2016-03-10 NOTE — Telephone Encounter (Signed)
Spoke with patient and scheduled for 03-19-16 for PAP -eh

## 2016-03-13 ENCOUNTER — Ambulatory Visit (INDEPENDENT_AMBULATORY_CARE_PROVIDER_SITE_OTHER): Payer: BC Managed Care – PPO | Admitting: Internal Medicine

## 2016-03-13 ENCOUNTER — Encounter: Payer: Self-pay | Admitting: Internal Medicine

## 2016-03-13 VITALS — BP 100/62 | HR 68 | Temp 98.3°F | Wt 169.0 lb

## 2016-03-13 DIAGNOSIS — F0781 Postconcussional syndrome: Secondary | ICD-10-CM | POA: Diagnosis not present

## 2016-03-13 DIAGNOSIS — R51 Headache: Secondary | ICD-10-CM

## 2016-03-13 DIAGNOSIS — S22068D Other fracture of T7-T8 thoracic vertebra, subsequent encounter for fracture with routine healing: Secondary | ICD-10-CM

## 2016-03-13 DIAGNOSIS — R4701 Aphasia: Secondary | ICD-10-CM

## 2016-03-13 DIAGNOSIS — G4486 Cervicogenic headache: Secondary | ICD-10-CM

## 2016-03-13 DIAGNOSIS — E538 Deficiency of other specified B group vitamins: Secondary | ICD-10-CM

## 2016-03-13 MED ORDER — CYANOCOBALAMIN 1000 MCG/ML IJ SOLN
1000.0000 ug | Freq: Once | INTRAMUSCULAR | Status: AC
  Administered 2016-03-13: 1000 ug via INTRAMUSCULAR

## 2016-03-13 NOTE — Addendum Note (Signed)
Addended by: Cresenciano Lick on: 03/13/2016 01:08 PM   Modules accepted: Orders

## 2016-03-13 NOTE — Assessment & Plan Note (Signed)
F/u w/Dr Patel ?

## 2016-03-13 NOTE — Assessment & Plan Note (Signed)
Memory loss is persistent. On [employer]

## 2016-03-13 NOTE — Progress Notes (Signed)
Pre visit review using our clinic review tool, if applicable. No additional management support is needed unless otherwise documented below in the visit note. 

## 2016-03-13 NOTE — Assessment & Plan Note (Signed)
Start PT again.

## 2016-03-13 NOTE — Progress Notes (Signed)
Subjective:  Patient ID: Regina [name], female    DOB: 11/07/1965  Age: 50 y.o. MRN: [id number]  CC: No chief complaint on file.   HPI Regina [name] presents for memory issues, HAs, depression f/u C/o neck pain w/ROM. The pt fell on Tuesday C/o an issue with word finding  Outpatient Medications Prior to Visit  Medication Sig Dispense Refill  . Acetaminophen-Codeine (TYLENOL/CODEINE #3) 300-30 MG tablet Take 0.5-1 tablets by mouth every 4 (four) hours as needed for pain. 60 tablet 1  . CASCARA SAGRADA PO Take by mouth.    . Cholecalciferol (GNP VITAMIN D) 1000 units tablet Take 2 tablets (2,000 Units total) by mouth daily. 100 tablet 3  . cyanocobalamin (,VITAMIN B-12,) 1000 MCG/ML injection Inject 1 mL (1,000 mcg total) into the skin every 14 (fourteen) days. 10 mL 11  . ergocalciferol (VITAMIN D2) 50000 units capsule Take 1 capsule (50,000 Units total) by mouth once a week. 6 capsule 0  . hydrocortisone 2.5 % cream Apply topically 2 (two) times daily. 40 g 1  . Phosphatidylserine-DHA-EPA (VAYACOG) 100-19.5-6.5 MG CAPS Take 1 capsule by mouth daily. 30 capsule 11  . SYRINGE-NEEDLE, DISP, 3 ML (BD ECLIPSE SYRINGE) 25G X 1" 3 ML MISC Use IM 50 each 11  . Turmeric 500 MG TABS Take by mouth 2 (two) times daily.    Marland Kitchen venlafaxine XR (EFFEXOR XR) 37.5 MG 24 hr capsule Take 1 capsule (37.5 mg total) by mouth daily with breakfast. 30 capsule 1  . vitamin B-12 (CYANOCOBALAMIN) 1000 MCG tablet Take 1,000 mcg by mouth daily.     No facility-administered medications prior to visit.     ROS Review of Systems  Constitutional: Negative for activity change, appetite change, chills, fatigue and unexpected weight change.  HENT: Negative for congestion, mouth sores and sinus pressure.   Eyes: Negative for visual disturbance.  Respiratory: Negative for cough and chest tightness.   Gastrointestinal: Negative for abdominal pain and nausea.  Genitourinary: Negative for difficulty urinating,  frequency and vaginal pain.  Musculoskeletal: Positive for back pain, neck pain and neck stiffness. Negative for gait problem.  Skin: Negative for pallor and rash.  Neurological: Positive for headaches. Negative for dizziness, tremors, weakness and numbness.  Psychiatric/Behavioral: Positive for decreased concentration and sleep disturbance. Negative for confusion and suicidal ideas. The patient is nervous/anxious.     Objective:  BP 100/62   Pulse 68   Temp 98.3 F (36.8 C)   Wt 169 lb (76.7 kg)   SpO2 99%   BMI 29.01 kg/m   BP Readings from Last 3 Encounters:  03/13/16 100/62  02/18/16 110/70  01/07/16 124/78    Wt Readings from Last 3 Encounters:  03/13/16 169 lb (76.7 kg)  02/18/16 164 lb (74.4 kg)  01/07/16 161 lb (73 kg)    Physical Exam  Constitutional: She appears well-developed. No distress.  HENT:  Head: Normocephalic.  Right Ear: External ear normal.  Left Ear: External ear normal.  Nose: Nose normal.  Mouth/Throat: Oropharynx is clear and moist.  Eyes: Conjunctivae are normal. Pupils are equal, round, and reactive to light. Right eye exhibits no discharge. Left eye exhibits no discharge.  Neck: Normal range of motion. Neck supple. No JVD present. No tracheal deviation present. No thyromegaly present.  Cardiovascular: Normal rate, regular rhythm and normal heart sounds.   Pulmonary/Chest: No stridor. No respiratory distress. She has no wheezes.  Abdominal: Soft. Bowel sounds are normal. She exhibits no distension and no mass. There is  no tenderness. There is no rebound and no guarding.  Musculoskeletal: She exhibits tenderness. She exhibits no edema.  Lymphadenopathy:    She has no cervical adenopathy.  Neurological: She displays normal reflexes. No cranial nerve deficit. She exhibits normal muscle tone. Coordination normal.  Skin: No rash noted. No erythema.  Psychiatric: Her behavior is normal. Thought content normal.    Lab Results  Component Value  Date   WBC 7.2 09/11/2015   HGB 13.4 09/11/2015   HCT 39.4 09/11/2015   PLT 385.0 09/11/2015   GLUCOSE 106 (H) 09/11/2015   CHOL 201 (H) 02/26/2015   TRIG 65.0 02/26/2015   HDL 62.10 02/26/2015   LDLCALC 126 (H) 02/26/2015   ALT 18 09/11/2015   AST 19 09/11/2015   NA 138 09/11/2015   K 3.5 09/11/2015   CL 101 09/11/2015   CREATININE 0.62 09/11/2015   BUN 11 09/11/2015   CO2 29 09/11/2015   TSH 1.19 09/11/2015   INR 1.16 06/29/2015    Mr Wrist Right W Contrast  Result Date: 12/06/2015 CLINICAL DATA:  Right wrist pain status post fall. Numbness and weakness. Limited range of motion. EXAM: MRI OF THE RIGHT WRIST WITH CONTRAST(MR Arthrogram) TECHNIQUE: Multiplanar, multisequence MR imaging of the wrist was performed immediately following contrast injection into the radiocarpal joint under fluoroscopic guidance. No intravenous contrast was administered. COMPARISON:  None. FINDINGS: Ligaments: Intact scapholunate and lunotriquetral ligaments. Triangular fibrocartilage: Intact. Tendons: Intact flexor and extensor compartment tendons. Carpal tunnel/median nerve: Normal. Guyon's canal: Normal. Joint/cartilage: Intraarticular contrast in the radiocarpal joint distending the joint capsule. No chondral defect. No intra-articular loose body. Bones/carpal alignment: No marrow signal abnormality. No fracture or dislocation. Normal alignment. Other: No fluid collection or hematoma. IMPRESSION: 1. No internal derangement of the right wrist. Electronically Signed   By: Kathreen Devoid   On: 12/06/2015 17:08   Dg Fluoro Guided Needle Plc Aspiration/injection Loc  Result Date: 12/06/2015 CLINICAL DATA:  Right wrist pain.  20 foot fall 5 months ago. FLUOROSCOPY TIME:  Radiation Exposure Index (as provided by the fluoroscopic device): 0.93 uGy*m2 Fluoroscopy Time:  18 seconds Number of Acquired Images:  0 PROCEDURE: Right WRIST INJECTION UNDER FLUOROSCOPY An appropriate skin entrance site was determined. The site  was marked, prepped with Betadine, draped in the usual sterile fashion, and infiltrated locally with 1% Lidocaine. A 25 gauge skin needle was advanced into the radiocarpal joint under intermittent fluoroscopy. 0.1 mL MultiHance was diluted and 20 mL of Isovue M 200. This was then used to opacify the proximal carpal joint. No immediate complication. IMPRESSION: Technically successful right wrist injection for MRI. Electronically Signed   By: San Morelle M.D.   On: 12/06/2015 16:18    Assessment & Plan:   There are no diagnoses linked to this encounter. I am having Ms. Littleton maintain her SYRINGE-NEEDLE (DISP) 3 ML, cyanocobalamin, CASCARA SAGRADA PO, Phosphatidylserine-DHA-EPA, Acetaminophen-Codeine, Cholecalciferol, vitamin B-12, Turmeric, hydrocortisone, ergocalciferol, and venlafaxine XR.  No orders of the defined types were placed in this encounter.    Follow-up: No Follow-up on file.  Walker Kehr, MD

## 2016-03-13 NOTE — Assessment & Plan Note (Signed)
On Effexor 

## 2016-03-13 NOTE — Assessment & Plan Note (Signed)
Speech therapy F/u Dr Katheren Puller

## 2016-03-13 NOTE — Assessment & Plan Note (Signed)
On B12 

## 2016-03-14 NOTE — Progress Notes (Deleted)
[name] [address] [place], [place] [address] Phone: [phone] Subjective:    I'm seeing this patient by the request  of:  Walker Kehr, MD  CC: Right shoulder pain Follow-up Neck pain follow-up,  [id number]  Regina [name] is a 50 y.o. female coming in with complaint of right shoulder pain. Patient has worsening back in April 2017 and have a fall causing a in acute epidural hemorrhage as well as a slightly displaced fracture of the right frontal parietal bone and a posterior fracture of the lamina at C6.  Patient's other imaging also had a left transverse process nondisplaced fractures at T3-T7.  Continues to have pain overall. Continues to have a dull, throbbing aching sensation. Patient continues to have difficulty with mid back pain.  Continued to have pain in the area. Has had trigger points in the upper shoulder previously. Continues to have difficulty on the left side. Patient's states that he still is undergoing. Patient stopped formal physical therapy secondary to pain.  Patient has been also complaining of right wrist pain. Stable to continues to have pain. Feels that the final myalgias contributing to him pain overall. Patient states that has not made any significant improvement.      Past Medical History:  Diagnosis Date  . Allergic urticaria   . ARTHRALGIA   . Arthritis   . Atrial fibrillation ([facility])   . B12 DEFICIENCY   . CFS (chronic fatigue syndrome)   . Endometriosis   . Epidural hemorrhage with loss of consciousness ([facility]) 06/29/15  . Fibroid   . Fibromyalgia   . Fracture of cervical vertebra, C6 ([facility]) 06/29/15  . Gastritis   . GERD   . Heart murmur   . History of endometriosis   . IBS (irritable bowel syndrome)   . Internal hemorrhoids   . MVP (mitral valve prolapse)   . Osteoarthritis   . Rosacea   . Thoracic compression fracture ([facility]) 06/29/15   T2-T6   Past Surgical History:  Procedure Laterality Date  .  ABDOMINAL HYSTERECTOMY     partial  . ABDOMINAL HYSTERECTOMY    . ANAL FISSURE REPAIR     Dr Rise [name] 2009  . APPENDECTOMY    . COLPOSCOPY    . HEMORRHOID SURGERY     Dr. Rise [name] 2009  . OVARIAN CYST REMOVAL    . RT BUNIONECTOMT    . TONSILLECTOMY AND ADENOIDECTOMY    . TUBAL LIGATION    . UTERINE FIBROID SURGERY     Social History   Social History  . Marital status: Married    Spouse name: N/A  . Number of children: N/A  . Years of education: N/A   Social History Main Topics  . Smoking status: Former Smoker    Quit date: 10/31/1988  . Smokeless tobacco: Never Used  . Alcohol use 0.6 - 1.2 oz/week    1 - 2 Standard drinks or equivalent per week     Comment: occasional   . Drug use: No  . Sexual activity: Yes    Partners: Male   Other Topics Concern  . Not on file   Social History Narrative   ** Merged History Encounter **       Working 2 jobs, 6d/wk      Regular exercise-no      Divorced   Allergies  Allergen Reactions  . Indocin [Indomethacin] Swelling, Rash and Other (See Comments)    REACTION: lupus like illness; butterfly rash, pain  and swelling all over body.   . Morphine Sulfate Nausea And Vomiting  . Sulfacetamide Sodium Swelling  . Caffeine Other (See Comments)    Induces A-fib  . Morphine And Related     nausea  . Sulfa Antibiotics Swelling  . Azithromycin Rash    ALL MYCIN DRUGS  . Erythromycin Stearate Hives and Swelling    REACTION: all mycins   Family History  Problem Relation Age of Onset  . Lung cancer Mother   . Thyroid cancer Mother   . Cancer Mother     INTESTINAL  . Healthy Father   . Diabetes      uncles/aunts  . Von Willebrand disease Daughter   . Asthma Neg Hx     Past medical history, social, surgical and family history all reviewed in electronic medical record.  No pertanent information unless stated regarding to the chief complaint.   Review of Systems: No headache, visual changes, nausea, vomiting, diarrhea,  constipation, dizziness, abdominal pain, skin rash, fevers, chills, night sweats, weight loss, swollen lymph nodes,chest pain, shortness of breath, mood changes.   Objective  There were no vitals taken for this visit.  General: No apparent distress alert and oriented x3 mood and affect normal, dressed appropriately.  HEENT: Pupils equal, extraocular movements intact  Respiratory: Patient's speak in full sentences and does not appear short of breath  Cardiovascular: No lower extremity edema, non tender, no erythema  Skin: Warm dry intact with no signs of infection or rash on extremities or on axial skeleton.  Abdomen: Soft nontender  Neuro: Cranial nerves II through XII are intact, neurovascularly intact in all extremities with 2+ DTRs and 2+ pulses.  Lymph: No lymphadenopathy of posterior or anterior cervical chain or axillae bilaterally.  Gait normal with good balance and coordination.  MSK:  Mild tenderness throughout muscular full range of motion and good stability and symmetric strength and tone of  elbows, wrist, hip, knee and ankles bilaterally.   Neck: Inspection unremarkable. No palpable stepoffs. Continue to avoid Spurling Patient continues to have 10 of extension. Grip strength and sensation normal in bilateral hands Strength good C4 to T1 distribution No sensory change to C4 to T1 Negative Hoffman sign bilaterally Reflexes normal Increased tenderness in the trapezius muscle.    Impression and Recommendations:     This case required medical decision making of moderate complexity.      Note: This dictation was prepared with Dragon dictation along with smaller phrase technology. Any transcriptional errors that result from this process are unintentional.

## 2016-03-16 ENCOUNTER — Ambulatory Visit: Payer: BC Managed Care – PPO | Admitting: Family Medicine

## 2016-03-19 ENCOUNTER — Ambulatory Visit: Payer: Self-pay | Admitting: Obstetrics and Gynecology

## 2016-03-31 ENCOUNTER — Encounter: Payer: Self-pay | Admitting: Obstetrics and Gynecology

## 2016-03-31 ENCOUNTER — Ambulatory Visit: Payer: Self-pay | Admitting: Obstetrics and Gynecology

## 2016-03-31 ENCOUNTER — Ambulatory Visit (INDEPENDENT_AMBULATORY_CARE_PROVIDER_SITE_OTHER): Payer: BC Managed Care – PPO | Admitting: Obstetrics and Gynecology

## 2016-03-31 VITALS — BP 102/60 | HR 76 | Resp 14 | Wt 169.0 lb

## 2016-03-31 DIAGNOSIS — Z87411 Personal history of vaginal dysplasia: Secondary | ICD-10-CM | POA: Diagnosis not present

## 2016-03-31 DIAGNOSIS — Z01419 Encounter for gynecological examination (general) (routine) without abnormal findings: Secondary | ICD-10-CM

## 2016-03-31 DIAGNOSIS — Z1272 Encounter for screening for malignant neoplasm of vagina: Secondary | ICD-10-CM | POA: Diagnosis not present

## 2016-03-31 NOTE — Progress Notes (Deleted)
GYNECOLOGY  VISIT   HPI: 51 y.o.   Married  Caucasian  female   [phone] with No LMP recorded. Patient has had a hysterectomy.   here for a repeat PAP    GYNECOLOGIC HISTORY: No LMP recorded. Patient has had a hysterectomy. Contraception:hysterectomy  Menopausal hormone therapy: none         OB History    Gravida Para Term Preterm AB Living   3 2 2  0 1 2   SAB TAB Ectopic Multiple Live Births   1 0 0   2         Patient Active Problem List   Diagnosis Date Noted  . Aphasia 03/13/2016  . Trigger point of left shoulder region 12/11/2015  . Fibromyalgia 12/11/2015  . Post concussion syndrome 11/01/2015  . Clavicle fracture 10/30/2015  . Hair loss 10/11/2015  . Pain in joint, shoulder region 09/11/2015  . Right wrist pain 08/07/2015  . Splinter in skin 07/30/2015  . Fall from ladder 07/30/2015  . Skull fracture with concussion ([place]) 07/30/2015  . Fracture of cervical vertebra, C6 ([place]) 07/30/2015  . Thoracic vertebral fracture ([place]) 07/30/2015  . Abdominal pain, epigastric 05/07/2015  . Scaly patch rash 02/26/2015  . Mitral valve regurgitation 09/27/2014  . PAT (paroxysmal atrial tachycardia) ([place]) 09/06/2014  . Tachycardia 07/31/2014  . Sprain of ankle 06/29/2014  . Earache, left 05/15/2013  . Dizziness 05/15/2013  . Asthmatic bronchitis 05/08/2013  . Benign paroxysmal positional vertigo 05/08/2013  . Chest wall pain 01/26/2013  . LUQ abdominal pain 01/20/2013  . Insomnia 01/10/2013  . Depression (emotion) 01/10/2013  . URI, acute 08/25/2012  . Snoring 08/25/2012  . IBS (irritable bowel syndrome) 02/08/2012  . Multiple pulmonary nodules 02/08/2012  . Chest heaviness 11/16/2010  . GERD 05/24/2010  . SINUSITIS, MAXILLARY, CHRONIC 05/20/2010  . Myalgia and myositis 11/20/2009  . Fatigue 11/20/2009  . ARTHRALGIA 06/26/2009  . Cervicogenic headache 06/26/2009  . TOBACCO USE, QUIT 06/26/2009  . LOW BACK PAIN 02/20/2009  . Rosacea 12/27/2008  . PRURITUS 12/27/2008   . Allergic urticaria 12/27/2008  . B12 deficiency 01/02/2008    Past Medical History:  Diagnosis Date  . Allergic urticaria   . ARTHRALGIA   . Arthritis   . Atrial fibrillation ([place])   . B12 DEFICIENCY   . CFS (chronic fatigue syndrome)   . Endometriosis   . Epidural hemorrhage with loss of consciousness ([place]) 06/29/15  . Fibroid   . Fibromyalgia   . Fracture of cervical vertebra, C6 ([place]) 06/29/15  . Gastritis   . GERD   . Heart murmur   . History of endometriosis   . IBS (irritable bowel syndrome)   . Internal hemorrhoids   . MVP (mitral valve prolapse)   . Osteoarthritis   . Rosacea   . Thoracic compression fracture ([place]) 06/29/15   T2-T6    Past Surgical History:  Procedure Laterality Date  . ABDOMINAL HYSTERECTOMY     partial  . ABDOMINAL HYSTERECTOMY    . ANAL FISSURE REPAIR     Dr Rise [name] 2009  . APPENDECTOMY    . COLPOSCOPY    . HEMORRHOID SURGERY     Dr. Rise [name] 2009  . OVARIAN CYST REMOVAL    . RT BUNIONECTOMT    . TONSILLECTOMY AND ADENOIDECTOMY    . TUBAL LIGATION    . UTERINE FIBROID SURGERY      Current Outpatient Prescriptions  Medication Sig Dispense Refill  . Acetaminophen-Codeine (TYLENOL/CODEINE #3) 300-30 MG tablet Take 0.5-1  tablets by mouth every 4 (four) hours as needed for pain. 60 tablet 1  . CASCARA SAGRADA PO Take by mouth.    . Cholecalciferol (GNP VITAMIN D) 1000 units tablet Take 2 tablets (2,000 Units total) by mouth daily. 100 tablet 3  . cyanocobalamin (,VITAMIN B-12,) 1000 MCG/ML injection Inject 1 mL (1,000 mcg total) into the skin every 14 (fourteen) days. 10 mL 11  . ergocalciferol (VITAMIN D2) 50000 units capsule Take 1 capsule (50,000 Units total) by mouth once a week. 6 capsule 0  . hydrocortisone 2.5 % cream Apply topically 2 (two) times daily. 40 g 1  . Phosphatidylserine-DHA-EPA (VAYACOG) 100-19.5-6.5 MG CAPS Take 1 capsule by mouth daily. 30 capsule 11  . SYRINGE-NEEDLE, DISP, 3 ML (BD ECLIPSE SYRINGE) 25G X 1" 3 ML  MISC Use IM 50 each 11  . Turmeric 500 MG TABS Take by mouth 2 (two) times daily.    . vitamin B-12 (CYANOCOBALAMIN) 1000 MCG tablet Take 1,000 mcg by mouth daily.    Marland Kitchen venlafaxine XR (EFFEXOR XR) 37.5 MG 24 hr capsule Take 1 capsule (37.5 mg total) by mouth daily with breakfast. 30 capsule 1   No current facility-administered medications for this visit.      ALLERGIES: Indocin [indomethacin]; Morphine sulfate; Sulfacetamide sodium; Caffeine; Morphine and related; Sulfa antibiotics; Azithromycin; and Erythromycin stearate  Family History  Problem Relation Age of Onset  . Lung cancer Mother   . Thyroid cancer Mother   . Cancer Mother     INTESTINAL  . Healthy Father   . Diabetes      uncles/aunts  . Von Willebrand disease Daughter   . Asthma Neg Hx     Social History   Social History  . Marital status: Married    Spouse name: N/A  . Number of children: N/A  . Years of education: N/A   Occupational History  . Not on file.   Social History Main Topics  . Smoking status: Former Smoker    Quit date: 10/31/1988  . Smokeless tobacco: Never Used  . Alcohol use 0.6 - 1.2 oz/week    1 - 2 Standard drinks or equivalent per week     Comment: occasional   . Drug use: No  . Sexual activity: Yes    Partners: Male   Other Topics Concern  . Not on file   Social History Narrative   ** Merged History Encounter **       Working 2 jobs, 6d/wk      Regular exercise-no      Divorced    Review of Systems  Constitutional: Negative.        Weight gain  HENT: Negative.        Sinuitis   Eyes: Negative.   Respiratory: Negative.   Cardiovascular: Negative.   Gastrointestinal: Negative.   Genitourinary: Negative.   Musculoskeletal: Positive for joint pain and myalgias.  Skin:       Hair loss   Neurological: Positive for headaches.       Memory problems   Endo/Heme/Allergies: Negative.   Psychiatric/Behavioral:       Anxiety    PHYSICAL EXAMINATION:    BP 102/60 (BP  Location: Right Arm, Patient Position: Sitting, Cuff Size: Normal)   Pulse 76   Resp 14   Wt 169 lb (76.7 kg)   BMI 29.01 kg/m     General appearance: alert, cooperative and appears stated age Neck: no adenopathy, supple, symmetrical, trachea midline and thyroid {CHL AMB PHY  EX THYROID NORM DEFAULT:[phone]::"normal to inspection and palpation"} Breasts: {Exam; breast:13139::"normal appearance, no masses or tenderness"} Abdomen: soft, non-tender; bowel sounds normal; no masses,  no organomegaly  Pelvic: External genitalia:  no lesions              Urethra:  normal appearing urethra with no masses, tenderness or lesions              Bartholins and Skenes: normal                 Vagina: normal appearing vagina with normal color and discharge, no lesions              Cervix: {CHL AMB PHY EX CERVIX NORM DEFAULT:[phone]::"no lesions"}              Bimanual Exam:  Uterus:  {CHL AMB PHY EX UTERUS NORM DEFAULT:[phone]::"normal size, contour, position, consistency, mobility, non-tender"}              Adnexa: {CHL AMB PHY EX ADNEXA NO MASS [phone]::"no mass, fullness, tenderness"}              Rectovaginal: {yes no:[id number]}.  Confirms.              Anus:  normal sphincter tone, no lesions  Chaperone was present for exam.  ASSESSMENT     PLAN    An After Visit Summary was printed and given to the patient.  *** minutes face to face time of which over 50% was spent in counseling.

## 2016-03-31 NOTE — Progress Notes (Deleted)
51 y.o. [id number] MarriedCaucasianF here for annual exam.      No LMP recorded. Patient has had a hysterectomy.          Sexually active: Yes.    The current method of family planning is status post hysterectomy.    Exercising: No.  The patient does not participate in regular exercise at present. Smoker:  Former smoker  Health Maintenance: Pap:  08-05-15 LSIL - colpo 08-22-15 HPV effect  History of abnormal Pap:  yes MMG:  12-24-14 WNL  Colonoscopy:  12-18-11 WNL  BMD:   2014 WNL  TDaP:  06-29-15  Gardasil: No    reports that she quit smoking about 27 years ago. She has never used smokeless tobacco. She reports that she drinks about 0.6 - 1.2 oz of alcohol per week . She reports that she does not use drugs.  Past Medical History:  Diagnosis Date  . Allergic urticaria   . ARTHRALGIA   . Arthritis   . Atrial fibrillation ([place])   . B12 DEFICIENCY   . CFS (chronic fatigue syndrome)   . Endometriosis   . Epidural hemorrhage with loss of consciousness ([place]) 06/29/15  . Fibroid   . Fibromyalgia   . Fracture of cervical vertebra, C6 ([place]) 06/29/15  . Gastritis   . GERD   . Heart murmur   . History of endometriosis   . IBS (irritable bowel syndrome)   . Internal hemorrhoids   . MVP (mitral valve prolapse)   . Osteoarthritis   . Rosacea   . Thoracic compression fracture ([place]) 06/29/15   T2-T6    Past Surgical History:  Procedure Laterality Date  . ABDOMINAL HYSTERECTOMY     partial  . ABDOMINAL HYSTERECTOMY    . ANAL FISSURE REPAIR     Dr Rise [name] 2009  . APPENDECTOMY    . COLPOSCOPY    . HEMORRHOID SURGERY     Dr. Rise [name] 2009  . OVARIAN CYST REMOVAL    . RT BUNIONECTOMT    . TONSILLECTOMY AND ADENOIDECTOMY    . TUBAL LIGATION    . UTERINE FIBROID SURGERY      Current Outpatient Prescriptions  Medication Sig Dispense Refill  . Acetaminophen-Codeine (TYLENOL/CODEINE #3) 300-30 MG tablet Take 0.5-1 tablets by mouth every 4 (four) hours as needed for pain. 60 tablet 1  .  CASCARA SAGRADA PO Take by mouth.    . Cholecalciferol (GNP VITAMIN D) 1000 units tablet Take 2 tablets (2,000 Units total) by mouth daily. 100 tablet 3  . cyanocobalamin (,VITAMIN B-12,) 1000 MCG/ML injection Inject 1 mL (1,000 mcg total) into the skin every 14 (fourteen) days. 10 mL 11  . ergocalciferol (VITAMIN D2) 50000 units capsule Take 1 capsule (50,000 Units total) by mouth once a week. 6 capsule 0  . hydrocortisone 2.5 % cream Apply topically 2 (two) times daily. 40 g 1  . Phosphatidylserine-DHA-EPA (VAYACOG) 100-19.5-6.5 MG CAPS Take 1 capsule by mouth daily. 30 capsule 11  . SYRINGE-NEEDLE, DISP, 3 ML (BD ECLIPSE SYRINGE) 25G X 1" 3 ML MISC Use IM 50 each 11  . Turmeric 500 MG TABS Take by mouth 2 (two) times daily.    . vitamin B-12 (CYANOCOBALAMIN) 1000 MCG tablet Take 1,000 mcg by mouth daily.    Marland Kitchen venlafaxine XR (EFFEXOR XR) 37.5 MG 24 hr capsule Take 1 capsule (37.5 mg total) by mouth daily with breakfast. 30 capsule 1   No current facility-administered medications for this visit.     Family History  Problem Relation Age of Onset  . Lung cancer Mother   . Thyroid cancer Mother   . Cancer Mother     INTESTINAL  . Healthy Father   . Diabetes      uncles/aunts  . Von Willebrand disease Daughter   . Asthma Neg Hx     Review of Systems  Constitutional: Positive for unexpected weight change.  HENT: Positive for sinus pressure.   Eyes: Negative.   Respiratory: Negative.   Cardiovascular: Negative.   Gastrointestinal: Negative.   Endocrine: Negative.   Genitourinary: Negative.   Musculoskeletal: Positive for joint swelling and myalgias.  Skin: Negative.        Hair loss   Allergic/Immunologic: Negative.   Neurological: Positive for headaches.       Memory problems   Psychiatric/Behavioral: Negative.        Anxiety    Exam:   BP 102/60 (BP Location: Right Arm, Patient Position: Sitting, Cuff Size: Normal)   Pulse 76   Resp 14   Wt 169 lb (76.7 kg)   BMI 29.01  kg/m   Weight change: @WEIGHTCHANGE @ Height:      Ht Readings from Last 3 Encounters:  02/18/16 5\' 4"  (1.626 m)  11/01/15 5\' 4"  (1.626 m)  10/30/15 5\' 4"  (1.626 m)    General appearance: alert, cooperative and appears stated age Head: Normocephalic, without obvious abnormality, atraumatic Neck: no adenopathy, supple, symmetrical, trachea midline and thyroid {CHL AMB PHY EX THYROID NORM DEFAULT:[phone]::"normal to inspection and palpation"} Lungs: clear to auscultation bilaterally Cardiovascular: regular rate and rhythm Breasts: {Exam; breast:13139::"normal appearance, no masses or tenderness"} Heart: regular rate and rhythm Abdomen: soft, non-tender; bowel sounds normal; no masses,  no organomegaly Extremities: extremities normal, atraumatic, no cyanosis or edema Skin: Skin color, texture, turgor normal. No rashes or lesions Lymph nodes: Cervical, supraclavicular, and axillary nodes normal. No abnormal inguinal nodes palpated Neurologic: Grossly normal   Pelvic: External genitalia:  no lesions              Urethra:  normal appearing urethra with no masses, tenderness or lesions              Bartholins and Skenes: normal                 Vagina: normal appearing vagina with normal color and discharge, no lesions              Cervix: {CHL AMB PHY EX CERVIX NORM DEFAULT:[phone]::"no lesions"}               Bimanual Exam:  Uterus:  {CHL AMB PHY EX UTERUS NORM DEFAULT:[phone]::"normal size, contour, position, consistency, mobility, non-tender"}              Adnexa: {CHL AMB PHY EX ADNEXA NO MASS DEFAULT:[phone]::"no mass, fullness, tenderness"}               Rectovaginal: Confirms               Anus:  normal sphincter tone, no lesions  Chaperone was present for exam.  A:  Well Woman with normal exam  P:

## 2016-03-31 NOTE — Progress Notes (Signed)
51 y.o. [id number] MarriedCaucasianF here for annual exam.   She is s/p a fall from 25 feet last April, had multiple fracutes including in her spine.  She suffers from chronic headaches, memory issues, anxiety. Not depressed.  Husband is a good support, only married a little over a year. Sexually active, no pain. No vasomotor symptoms.  She has a h/o a hysterectomy and vaginal dysplasia. Last pap was in 5/17, LSIL, +HPV, hpv effect on biopsy.     No LMP recorded. Patient has had a hysterectomy.          Sexually active: Yes.    The current method of family planning is status post hysterectomy.    Exercising: No.  The patient does not participate in regular exercise at present. Smoker:  former  Health Maintenance: Pap:  08-05-15 LSIL had colpo 08-22-15 HPV effect  History of abnormal Pap:  yes MMG:  12-24-14 WNL  Colonoscopy:  12-18-11 WNL, h/o polyps BMD:   2014-2015 WNL per patient TDaP:  06-29-15 Gardasil: N/A   reports that she quit smoking about 27 years ago. She has never used smokeless tobacco. She reports that she drinks about 0.6 - 1.2 oz of alcohol per week . She reports that she does not use drugs.Not working since her accident last year. She is on disability. Kids are 48 and 24 (daughters), 3 grandchildren all local.   Past Medical History:  Diagnosis Date  . Allergic urticaria   . ARTHRALGIA   . Arthritis   . Atrial fibrillation ([place])   . B12 DEFICIENCY   . CFS (chronic fatigue syndrome)   . Endometriosis   . Epidural hemorrhage with loss of consciousness ([place]) 06/29/15  . Fibroid   . Fibromyalgia   . Fracture of cervical vertebra, C6 ([place]) 06/29/15  . Gastritis   . GERD   . Heart murmur   . History of endometriosis   . IBS (irritable bowel syndrome)   . Internal hemorrhoids   . MVP (mitral valve prolapse)   . Osteoarthritis   . Rosacea   . Thoracic compression fracture ([place]) 06/29/15   T2-T6    Past Surgical History:  Procedure Laterality Date  . ABDOMINAL  HYSTERECTOMY     partial  . ABDOMINAL HYSTERECTOMY    . ANAL FISSURE REPAIR     Dr Rise [name] 2009  . APPENDECTOMY    . COLPOSCOPY    . HEMORRHOID SURGERY     Dr. Rise [name] 2009  . OVARIAN CYST REMOVAL    . RT BUNIONECTOMT    . TONSILLECTOMY AND ADENOIDECTOMY    . TUBAL LIGATION    . UTERINE FIBROID SURGERY      Current Outpatient Prescriptions  Medication Sig Dispense Refill  . Acetaminophen-Codeine (TYLENOL/CODEINE #3) 300-30 MG tablet Take 0.5-1 tablets by mouth every 4 (four) hours as needed for pain. 60 tablet 1  . CASCARA SAGRADA PO Take by mouth.    . Cholecalciferol (GNP VITAMIN D) 1000 units tablet Take 2 tablets (2,000 Units total) by mouth daily. 100 tablet 3  . cyanocobalamin (,VITAMIN B-12,) 1000 MCG/ML injection Inject 1 mL (1,000 mcg total) into the skin every 14 (fourteen) days. 10 mL 11  . ergocalciferol (VITAMIN D2) 50000 units capsule Take 1 capsule (50,000 Units total) by mouth once a week. 6 capsule 0  . hydrocortisone 2.5 % cream Apply topically 2 (two) times daily. 40 g 1  . Phosphatidylserine-DHA-EPA (VAYACOG) 100-19.5-6.5 MG CAPS Take 1 capsule by mouth daily. 30 capsule 11  .  SYRINGE-NEEDLE, DISP, 3 ML (BD ECLIPSE SYRINGE) 25G X 1" 3 ML MISC Use IM 50 each 11  . Turmeric 500 MG TABS Take by mouth 2 (two) times daily.    . vitamin B-12 (CYANOCOBALAMIN) 1000 MCG tablet Take 1,000 mcg by mouth daily.    Marland Kitchen venlafaxine XR (EFFEXOR XR) 37.5 MG 24 hr capsule Take 1 capsule (37.5 mg total) by mouth daily with breakfast. 30 capsule 1   No current facility-administered medications for this visit.     Family History  Problem Relation Age of Onset  . Lung cancer Mother   . Thyroid cancer Mother   . Cancer Mother     INTESTINAL  . Healthy Father   . Diabetes      uncles/aunts  . Von Willebrand disease Daughter   . Asthma Neg Hx     Review of Systems  Constitutional: Negative.        Weight gain  HENT: Positive for sinus pressure.   Eyes: Negative.    Respiratory: Negative.   Cardiovascular: Negative.   Gastrointestinal: Negative.   Endocrine: Negative.   Genitourinary: Negative.   Musculoskeletal: Positive for joint swelling and myalgias.  Skin: Negative.        Hair loss  Allergic/Immunologic: Negative.   Neurological: Positive for headaches.       Memory problems   Psychiatric/Behavioral: Negative.        Anxiety    Exam:   BP 102/60 (BP Location: Right Arm, Patient Position: Sitting, Cuff Size: Normal)   Pulse 76   Resp 14   Wt 169 lb (76.7 kg)   BMI 29.01 kg/m   Weight change: @WEIGHTCHANGE @ Height:      Ht Readings from Last 3 Encounters:  02/18/16 5\' 4"  (1.626 m)  11/01/15 5\' 4"  (1.626 m)  10/30/15 5\' 4"  (1.626 m)    General appearance: alert, cooperative and appears stated age Head: Normocephalic, without obvious abnormality, atraumatic Neck: no adenopathy, supple, symmetrical, trachea midline and thyroid normal to inspection and palpation Lungs: clear to auscultation bilaterally Cardiovascular: regular rate and rhythm Breasts: normal appearance, no masses or tenderness Heart: regular rate and rhythm Abdomen: soft, non-tender; bowel sounds normal; no masses,  no organomegaly Extremities: extremities normal, atraumatic, no cyanosis or edema Skin: Skin color, texture, turgor normal. No rashes or lesions Lymph nodes: Cervical, supraclavicular, and axillary nodes normal. No abnormal inguinal nodes palpated Neurologic: Grossly normal   Pelvic: External genitalia:  no lesions              Urethra:  normal appearing urethra with no masses, tenderness or lesions              Bartholins and Skenes: normal                 Vagina: normal appearing vagina with normal color and discharge, no lesions              Cervix: absent               Bimanual Exam:  Uterus:  uterus absent              Adnexa: no mass, fullness, tenderness               Rectovaginal: Confirms               Anus:  normal sphincter tone, no  lesions  Chaperone was present for exam.  A:  Well Woman with normal exam  H/O vaginal dysplasia  P:   Pap with hpv  Mammogram overdue, # given  Colonoscopy UTD  Discussed breast self exam  Discussed calcium and vit D intake  Labs with primary

## 2016-03-31 NOTE — Patient Instructions (Signed)

## 2016-04-01 ENCOUNTER — Ambulatory Visit: Payer: BC Managed Care – PPO | Attending: Internal Medicine | Admitting: Physical Therapy

## 2016-04-01 ENCOUNTER — Encounter: Payer: Self-pay | Admitting: Physical Therapy

## 2016-04-01 DIAGNOSIS — R41841 Cognitive communication deficit: Secondary | ICD-10-CM | POA: Insufficient documentation

## 2016-04-01 DIAGNOSIS — M546 Pain in thoracic spine: Secondary | ICD-10-CM | POA: Diagnosis present

## 2016-04-01 DIAGNOSIS — G4489 Other headache syndrome: Secondary | ICD-10-CM | POA: Diagnosis present

## 2016-04-01 DIAGNOSIS — M6281 Muscle weakness (generalized): Secondary | ICD-10-CM | POA: Diagnosis present

## 2016-04-01 DIAGNOSIS — M62838 Other muscle spasm: Secondary | ICD-10-CM | POA: Diagnosis present

## 2016-04-01 NOTE — Therapy (Signed)
[facility] [address] [place], [place], [address] Phone: [phone]   Fax:  [phone]  Physical Therapy Evaluation  Patient Details  Name: Regina [name] MRN: [id number] Date of Birth: [birth year] Referring Provider: Dr. Lew Dawes  Encounter Date: 04/01/2016      PT End of Session - 04/01/16 1623    Visit Number 1   Date for PT Re-Evaluation 06/24/16   PT Start Time 1536   PT Stop Time 1615   PT Time Calculation (min) 39 min   Activity Tolerance Patient tolerated treatment well   Behavior During Therapy [facility] for tasks assessed/performed      Past Medical History:  Diagnosis Date  . Allergic urticaria   . ARTHRALGIA   . Arthritis   . Atrial fibrillation ([place])   . B12 DEFICIENCY   . CFS (chronic fatigue syndrome)   . Endometriosis   . Epidural hemorrhage with loss of consciousness ([place]) 06/29/15  . Fibroid   . Fibromyalgia   . Fracture of cervical vertebra, C6 ([place]) 06/29/15  . Gastritis   . GERD   . Heart murmur   . History of endometriosis   . IBS (irritable bowel syndrome)   . Internal hemorrhoids   . MVP (mitral valve prolapse)   . Osteoarthritis   . Rosacea   . Thoracic compression fracture ([place]) 06/29/15   T2-T6    Past Surgical History:  Procedure Laterality Date  . ABDOMINAL HYSTERECTOMY     partial  . ABDOMINAL HYSTERECTOMY    . ANAL FISSURE REPAIR     Dr Rise [name] 2009  . APPENDECTOMY    . COLPOSCOPY    . HEMORRHOID SURGERY     Dr. Rise [name] 2009  . OVARIAN CYST REMOVAL    . RT BUNIONECTOMT    . TONSILLECTOMY AND ADENOIDECTOMY    . TUBAL LIGATION    . UTERINE FIBROID SURGERY      There were no vitals filed for this visit.       Subjective Assessment - 04/01/16 1544    Subjective 06/29/2015 fell off a 20 foot ladder with skull fracture.  Patient fractured her thoracic vertebrae and right shoulder. Patient had 12 weeks of PT that was [place] trained. Last PT, Elnita Maxwell,  visit was in November.  Patient has difficulty with finding words. Patient has areas of atrophy on her back.  constant headache in lower cervical.  Patient will be seeing a speech pathologist.    Patient Stated Goals reduce pain, turn neck instead of body   Currently in Pain? Yes   Pain Score 3    Pain Location Head   Pain Orientation Lower;Posterior   Pain Descriptors / Indicators Dull;Aching   Pain Type Chronic pain   Pain Onset More than a month ago   Pain Frequency Constant   Aggravating Factors  turn head at end range   Pain Relieving Factors traction   Multiple Pain Sites Yes   Pain Score 8   Pain Location Thoracic   Pain Orientation Posterior   Pain Descriptors / Indicators Penetrating   Pain Type Chronic pain   Pain Onset More than a month ago   Pain Frequency Intermittent   Aggravating Factors  turn head, touch painful spot   Pain Relieving Factors not turning head            [facility] PT Assessment - 04/01/16 0001      Assessment   Medical Diagnosis F07.81 Post concusion syndrome;  S22.068D Other closed fracture of seventh thoracic vertebra lwith routine healing, subbsequent encounter; R51 Cervicogenic headache   Referring Provider Dr. Tyrone Apple Plotnikov   Onset Date/Surgical Date 06/29/15   Prior Therapy 12 weeks of PT     Precautions   Precautions None     Restrictions   Weight Bearing Restrictions No     Balance Screen   Has the patient fallen in the past 6 months Yes   How many times? 1  walking in dark in daughters room over luggage   Has the patient had a decrease in activity level because of a fear of falling?  No   Is the patient reluctant to leave their home because of a fear of falling?  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     Cognition   Overall Cognitive Status Within Functional Limits for tasks  assessed   Memory Impaired     Observation/Other Assessments   Focus on Therapeutic Outcomes (FOTO)  58% limitation  45% limitaiton     Posture/Postural Control   Posture/Postural Control Postural limitations   Posture Comments increased cervical thoracic kyphosis     ROM / Strength   AROM / PROM / Strength AROM;Strength     AROM   AROM Assessment Site Cervical   Cervical Flexion 50   Cervical Extension 40   Cervical - Right Side Bend 25   Cervical - Left Side Bend 30   Cervical - Right Rotation 40   Cervical - Left Rotation 30   Thoracic - Right Rotation decreased by 25%   Thoracic - Left Rotation decreased by 25%     Strength   Overall Strength Comments bil. shoulder strength is 4/5     Palpation   Palpation comment tenderness located on left upper trapezius; inbetween scapula and spine; muscle tightness located in suboccipitals, scalenes, cervical paraspinals, upper trapezius                           PT Education - 04/01/16 1623    Education provided Yes   Education Details information on dry needling   Person(s) Educated Patient   Methods Explanation;Demonstration;Handout   Comprehension Verbalized understanding          PT Short Term Goals - 04/01/16 1644      PT SHORT TERM GOAL #1   Title independent with initial HEP   Time 4   Period Weeks   Status New     PT SHORT TERM GOAL #2   Title headaches decreased in frequency by 25%   Time 4   Period Weeks   Status New     PT SHORT TERM GOAL #3   Title ability to turn her head with pain decreased >/= 25% due to increase tissue mobility   Time 4   Period Weeks   Status New           PT Long Term Goals - 04/01/16 1645      PT LONG TERM GOAL #1   Title independent with HEP and understand how to progress herself   Time 12   Period Weeks   Status New     PT LONG TERM GOAL #2   Title ability to look upward into a cabinet with >/= 50% greater ease due to  increase mobility and  decreased pain.   Time 12   Period Weeks   Status New     PT LONG TERM GOAL #3   Title -   Time 12   Period Weeks   Status New     PT LONG TERM GOAL #4   Title ability to vacuum, clean dishes and cook with >/= 50% greater ease due to increased strength and decreased pain   Time 12   Period Weeks   Status New     PT LONG TERM GOAL #5   Title FOTO score </= 45% limitation   Time 12   Period Weeks   Status New               Plan - 04/01/16 1624    Clinical Impression Statement Patient is a 51 year old female who fell on 06/29/2015. She had a concussion, fractured T7, fractured shoulder and cerivcal headache.  Patient has difficulty finding words for items she wants to say.  Patient will be going to speech therapy next week to be evaluated. Patient reports her cervical headaches  are at level  3/10 and better with cervical traction.  Her cervical and thoracic spine pain is 8/10 when she turns her head.  Cervical and thoracic ROM is limited.  Bilateral shoulder strength is 4/5.  Palpable tenderness and tightness located in cervical paraspinals, suboccipitals, scalenes, upper trapezius, and rhomboids.  Patient has increased swelling in cervical thoracic area with left worse than right.  Patient gets dizzy when she lays down or sits up. Patient is moderately complex evaluation due to evolving condition and comorbidities such as dizzyness, not able to retreive words of objects she knows, past consussion that will impact her care.  Patient will benefit from skilled therapy to reduce pain and improve mobility of her neck and back while improving her funciton.    Rehab Potential Good   Clinical Impairments Affecting Rehab Potential Fell on 06/29/2015 resulting with fractured skull, fractured T7, fractured shoulder, had 12 weeks of [facility] therapy   PT Frequency 3x / week   PT Duration 12 weeks   PT Treatment/Interventions Cryotherapy;Electrical Stimulation;Ultrasound;Traction;Moist  Heat;Therapeutic activities;Therapeutic exercise;Neuromuscular re-education;Patient/family education;Passive range of motion;Manual techniques;Dry needling;Energy conservation;Taping   PT Next Visit Plan Dry needling to cervical musculature, subocciptials, rhomboids, upper trapezius, soft tissue work, joint mobilization, cervical isometrics, interscapular strengthening   PT Home Exercise Plan cervical stabilization exercises   Recommended Other Services none   Consulted and Agree with Plan of Care Patient      Patient will benefit from skilled therapeutic intervention in order to improve the following deficits and impairments:  Decreased range of motion, Increased fascial restricitons, Decreased endurance, Increased muscle spasms, Pain, Hypomobility, Impaired flexibility, Decreased mobility, Decreased strength  Visit Diagnosis: Other headache syndrome - Plan: PT plan of care cert/re-cert  Pain in thoracic spine - Plan: PT plan of care cert/re-cert  Other muscle spasm - Plan: PT plan of care cert/re-cert  Muscle weakness (generalized) - Plan: PT plan of care cert/re-cert     Problem List Patient Active Problem List   Diagnosis Date Noted  . Aphasia 03/13/2016  . Trigger point of left shoulder region 12/11/2015  . Fibromyalgia 12/11/2015  . Post concussion syndrome 11/01/2015  . Clavicle fracture 10/30/2015  . Hair loss 10/11/2015  . Pain in joint, shoulder region 09/11/2015  . Right wrist pain 08/07/2015  . Splinter in skin 07/30/2015  . Fall from ladder 07/30/2015  . Skull fracture with  concussion ([place]) 07/30/2015  . Fracture of cervical vertebra, C6 ([place]) 07/30/2015  . Thoracic vertebral fracture ([place]) 07/30/2015  . Abdominal pain, epigastric 05/07/2015  . Scaly patch rash 02/26/2015  . Mitral valve regurgitation 09/27/2014  . PAT (paroxysmal atrial tachycardia) ([place]) 09/06/2014  . Tachycardia 07/31/2014  . Sprain of ankle 06/29/2014  . Earache, left 05/15/2013  .  Dizziness 05/15/2013  . Asthmatic bronchitis 05/08/2013  . Benign paroxysmal positional vertigo 05/08/2013  . Chest wall pain 01/26/2013  . LUQ abdominal pain 01/20/2013  . Insomnia 01/10/2013  . Depression (emotion) 01/10/2013  . URI, acute 08/25/2012  . Snoring 08/25/2012  . IBS (irritable bowel syndrome) 02/08/2012  . Multiple pulmonary nodules 02/08/2012  . Chest heaviness 11/16/2010  . GERD 05/24/2010  . SINUSITIS, MAXILLARY, CHRONIC 05/20/2010  . Myalgia and myositis 11/20/2009  . Fatigue 11/20/2009  . ARTHRALGIA 06/26/2009  . Cervicogenic headache 06/26/2009  . TOBACCO USE, QUIT 06/26/2009  . LOW BACK PAIN 02/20/2009  . Rosacea 12/27/2008  . PRURITUS 12/27/2008  . Allergic urticaria 12/27/2008  . B12 deficiency 01/02/2008    Earlie Counts, PT 04/01/16 4:51 PM   [facility] [address], [place], [place], [address] Phone: [phone]   Fax:  [phone]  Name: DAKIRA BABAYEV MRN: [id number] Date of Birth: 09/26/1965

## 2016-04-02 ENCOUNTER — Ambulatory Visit: Payer: BC Managed Care – PPO | Admitting: Physical Therapy

## 2016-04-02 DIAGNOSIS — M546 Pain in thoracic spine: Secondary | ICD-10-CM

## 2016-04-02 DIAGNOSIS — M6281 Muscle weakness (generalized): Secondary | ICD-10-CM

## 2016-04-02 DIAGNOSIS — M62838 Other muscle spasm: Secondary | ICD-10-CM

## 2016-04-02 DIAGNOSIS — G4489 Other headache syndrome: Secondary | ICD-10-CM

## 2016-04-02 NOTE — Therapy (Signed)
[facility] [address], [place], [place], [address] Phone: [phone]   Fax:  [phone]  Physical Therapy Treatment  Patient Details  Name: Regina [name] MRN: [id number] Date of Birth: Jun 30, 1965 Referring Provider: Dr. Lew Dawes  Encounter Date: 04/02/2016      PT End of Session - 04/02/16 1443    Visit Number 2   Date for PT Re-Evaluation 06/24/16   PT Start Time 1400   PT Stop Time 1450   PT Time Calculation (min) 50 min   Activity Tolerance Patient tolerated treatment well      Past Medical History:  Diagnosis Date  . Allergic urticaria   . ARTHRALGIA   . Arthritis   . Atrial fibrillation ([place])   . B12 DEFICIENCY   . CFS (chronic fatigue syndrome)   . Endometriosis   . Epidural hemorrhage with loss of consciousness ([place]) 06/29/15  . Fibroid   . Fibromyalgia   . Fracture of cervical vertebra, C6 ([place]) 06/29/15  . Gastritis   . GERD   . Heart murmur   . History of endometriosis   . IBS (irritable bowel syndrome)   . Internal hemorrhoids   . MVP (mitral valve prolapse)   . Osteoarthritis   . Rosacea   . Thoracic compression fracture ([place]) 06/29/15   T2-T6    Past Surgical History:  Procedure Laterality Date  . ABDOMINAL HYSTERECTOMY     partial  . ABDOMINAL HYSTERECTOMY    . ANAL FISSURE REPAIR     Dr Rise [name] 2009  . APPENDECTOMY    . COLPOSCOPY    . HEMORRHOID SURGERY     Dr. Rise [name] 2009  . OVARIAN CYST REMOVAL    . RT BUNIONECTOMT    . TONSILLECTOMY AND ADENOIDECTOMY    . TUBAL LIGATION    . UTERINE FIBROID SURGERY      There were no vitals filed for this visit.      Subjective Assessment - 04/02/16 1403    Subjective Less of a headache today.  Base of skull pain, neck, shoulders.  I feel like a bobble head.  Massage yesterday helped.    Sporadic pain.  Still doing lumbar rotation stretches at home bc it feels good.  Had acupuncture in the past temporary improvement  only.  Patient states she does like e-stim when had in the past.     Pertinent History fibromyalgia;  had previous PT neck and shoulder   Currently in Pain? Yes   Pain Score 3    Pain Location Neck   Pain Orientation Right;Left   Pain Type Chronic pain                         [facility] Adult PT Treatment/Exercise - 04/02/16 0001      Exercises   Exercises Neck     Neck Exercises: Seated   Lateral Flexion --     Neck Exercises: Supine   Neck Retraction 5 reps   Capital Flexion 5 reps     Modalities   Modalities Moist Heat     Moist Heat Therapy   Number Minutes Moist Heat 15 Minutes   Moist Heat Location Shoulder;Cervical     Manual Therapy   Manual Therapy Manual Traction   Soft tissue mobilization bilateral upper traps, cervical paraspinals, suboccipitals   Manual Traction gentle 3x10 sec     Neck Exercises: Stretches   Upper Trapezius Stretch 2 reps;20 seconds  Trigger Point Dry Needling - 04/02/16 1417    Consent Given? Yes   Education Handout Provided Yes   Muscles Treated Upper Body Upper trapezius;Suboccipitals muscle group  bilateral cervical multfidi   Upper Trapezius Response Twitch reponse elicited;Palpable increased muscle length   SubOccipitals Response Twitch response elicited;Palpable increased muscle length     Performed bilaterally.          PT Education - 04/02/16 1443    Education provided Yes   Education Details dry needling after care;  supine retractions, supine nods, seated upper trap stretch   Person(s) Educated Patient   Methods Explanation;Demonstration;Handout   Comprehension Verbalized understanding;Returned demonstration          PT Short Term Goals - 04/02/16 2015      PT SHORT TERM GOAL #1   Title independent with initial HEP   Time 4   Period Weeks   Status On-going     PT SHORT TERM GOAL #2   Title headaches decreased in frequency by 25%   Time 4   Period Weeks   Status On-going      PT SHORT TERM GOAL #3   Title ability to turn her head with pain decreased >/= 25% due to increase tissue mobility   Time 4   Period Weeks   Status On-going           PT Long Term Goals - 04/02/16 2016      PT LONG TERM GOAL #1   Title independent with HEP and understand how to progress herself   Time 12   Period Weeks   Status On-going     PT LONG TERM GOAL #2   Title ability to look upward into a cabinet with >/= 50% greater ease due to increase mobility and decreased pain.   Time 12   Period Weeks   Status On-going     PT LONG TERM GOAL #4   Title ability to vacuum, clean dishes and cook with >/= 50% greater ease due to increased strength and decreased pain   Time 12   Period Weeks   Status On-going     PT LONG TERM GOAL #5   Title FOTO score </= 45% limitation   Time 12   Period Weeks   Status On-going               Plan - 04/02/16 2009    Clinical Impression Statement The patient has numerous tender points in cervical musculature secondary to spinal injury in April in addition to fibromyalgia.  She is sensitive with needling and therefore there may be a slower progression if this is continued as an adjunct to manual therapy and exercise.  Exercise was encouaged (low reps and intensity only) for better long term outcomes (patient reports only short term improvements with acupuncture and chiro treatment in the past).  The patient reports post therapy soreness.  Therapist closely monitoring response very closely secondary to complex issues.     Clinical Impairments Affecting Rehab Potential Fell on 06/29/2015 resulting with fractured skull, fractured T7, fractured shoulder, had 12 weeks of [facility] Physical therapy   PT Next Visit Plan assess response to dry needling #1 and  continue if helpful;  soft tissue work;  joint mobilization, cervical isometrics and scapular strengthening;  no e-stim (patient reports bad response in past)      Patient will benefit from  skilled therapeutic intervention in order to improve the following deficits and impairments:     Visit  Diagnosis: Other headache syndrome  Pain in thoracic spine  Other muscle spasm  Muscle weakness (generalized)     Problem List Patient Active Problem List   Diagnosis Date Noted  . Aphasia 03/13/2016  . Trigger point of left shoulder region 12/11/2015  . Fibromyalgia 12/11/2015  . Post concussion syndrome 11/01/2015  . Clavicle fracture 10/30/2015  . Hair loss 10/11/2015  . Pain in joint, shoulder region 09/11/2015  . Right wrist pain 08/07/2015  . Splinter in skin 07/30/2015  . Fall from ladder 07/30/2015  . Skull fracture with concussion ([place]) 07/30/2015  . Fracture of cervical vertebra, C6 ([place]) 07/30/2015  . Thoracic vertebral fracture ([place]) 07/30/2015  . Abdominal pain, epigastric 05/07/2015  . Scaly patch rash 02/26/2015  . Mitral valve regurgitation 09/27/2014  . PAT (paroxysmal atrial tachycardia) ([place]) 09/06/2014  . Tachycardia 07/31/2014  . Sprain of ankle 06/29/2014  . Earache, left 05/15/2013  . Dizziness 05/15/2013  . Asthmatic bronchitis 05/08/2013  . Benign paroxysmal positional vertigo 05/08/2013  . Chest wall pain 01/26/2013  . LUQ abdominal pain 01/20/2013  . Insomnia 01/10/2013  . Depression (emotion) 01/10/2013  . URI, acute 08/25/2012  . Snoring 08/25/2012  . IBS (irritable bowel syndrome) 02/08/2012  . Multiple pulmonary nodules 02/08/2012  . Chest heaviness 11/16/2010  . GERD 05/24/2010  . SINUSITIS, MAXILLARY, CHRONIC 05/20/2010  . Myalgia and myositis 11/20/2009  . Fatigue 11/20/2009  . ARTHRALGIA 06/26/2009  . Cervicogenic headache 06/26/2009  . TOBACCO USE, QUIT 06/26/2009  . LOW BACK PAIN 02/20/2009  . Rosacea 12/27/2008  . PRURITUS 12/27/2008  . Allergic urticaria 12/27/2008  . B12 deficiency 01/02/2008   Ruben Im, PT 04/02/16 8:18 PM Phone: [phone] Fax: [phone]  Alvera Singh 04/02/2016, 8:17 PM  [facility] [address] [place], [place], [address] Phone: [phone]   Fax:  [phone]  Name: Regina [name] MRN: [id number] Date of Birth: 1965/09/27

## 2016-04-02 NOTE — Patient Instructions (Signed)
     Trigger Point Dry Needling  . What is Trigger Point Dry Needling (DN)? o DN is a physical therapy technique used to treat muscle pain and dysfunction. Specifically, DN helps deactivate muscle trigger points (muscle knots).  o A thin filiform needle is used to penetrate the skin and stimulate the underlying trigger point. The goal is for a local twitch response (LTR) to occur and for the trigger point to relax. No medication of any kind is injected during the procedure.   . What Does Trigger Point Dry Needling Feel Like?  o The procedure feels different for each individual patient. Some patients report that they do not actually feel the needle enter the skin and overall the process is not painful. Very mild bleeding may occur. However, many patients feel a deep cramping in the muscle in which the needle was inserted. This is the local twitch response.   . How Will I feel after the treatment? o Soreness is normal, and the onset of soreness may not occur for a few hours. Typically this soreness does not last longer than two days.  o Bruising is uncommon, however; ice can be used to decrease any possible bruising.  o In rare cases feeling tired or nauseous after the treatment is normal. In addition, your symptoms may get worse before they get better, this period will typically not last longer than 24 hours.   . What Can I do After My Treatment? o Increase your hydration by drinking more water for the next 24 hours. o You may place ice or heat on the areas treated that have become sore, however, do not use heat on inflamed or bruised areas. Heat often brings more relief post needling. o You can continue your regular activities, but vigorous activity is not recommended initially after the treatment for 24 hours. o DN is best combined with other physical therapy such as strengthening, stretching, and other therapies.    Shawn Dannenberg PT [facility] [address], [address] [place], [place] [address] Phone # [phone] Fax [phone] 

## 2016-04-03 LAB — IPS PAP TEST WITH HPV

## 2016-04-06 ENCOUNTER — Encounter: Payer: Self-pay | Admitting: Physical Therapy

## 2016-04-06 ENCOUNTER — Ambulatory Visit: Payer: BC Managed Care – PPO | Admitting: Physical Therapy

## 2016-04-06 DIAGNOSIS — G4489 Other headache syndrome: Secondary | ICD-10-CM

## 2016-04-06 DIAGNOSIS — M6281 Muscle weakness (generalized): Secondary | ICD-10-CM

## 2016-04-06 DIAGNOSIS — M546 Pain in thoracic spine: Secondary | ICD-10-CM

## 2016-04-06 DIAGNOSIS — M62838 Other muscle spasm: Secondary | ICD-10-CM

## 2016-04-06 NOTE — Therapy (Signed)
[facility] [address], [place], [place], [address] Phone: [phone]   Fax:  [phone]  Physical Therapy Treatment  Patient Details  Name: Regina [name] MRN: [id number] Date of Birth: 01-May-1965 Referring Provider: Dr. Lew Dawes  Encounter Date: 04/06/2016      PT End of Session - 04/06/16 1152    Visit Number 3   Date for PT Re-Evaluation 06/24/16   PT Start Time 1143   PT Stop Time 1237   PT Time Calculation (min) 54 min   Activity Tolerance Patient tolerated treatment well   Behavior During Therapy [facility] for tasks assessed/performed      Past Medical History:  Diagnosis Date  . Allergic urticaria   . ARTHRALGIA   . Arthritis   . Atrial fibrillation ([place])   . B12 DEFICIENCY   . CFS (chronic fatigue syndrome)   . Endometriosis   . Epidural hemorrhage with loss of consciousness ([place]) 06/29/15  . Fibroid   . Fibromyalgia   . Fracture of cervical vertebra, C6 ([place]) 06/29/15  . Gastritis   . GERD   . Heart murmur   . History of endometriosis   . IBS (irritable bowel syndrome)   . Internal hemorrhoids   . MVP (mitral valve prolapse)   . Osteoarthritis   . Rosacea   . Thoracic compression fracture ([place]) 06/29/15   T2-T6    Past Surgical History:  Procedure Laterality Date  . ABDOMINAL HYSTERECTOMY     partial  . ABDOMINAL HYSTERECTOMY    . ANAL FISSURE REPAIR     Dr Rise [name] 2009  . APPENDECTOMY    . COLPOSCOPY    . HEMORRHOID SURGERY     Dr. Rise [name] 2009  . OVARIAN CYST REMOVAL    . RT BUNIONECTOMT    . TONSILLECTOMY AND ADENOIDECTOMY    . TUBAL LIGATION    . UTERINE FIBROID SURGERY      There were no vitals filed for this visit.      Subjective Assessment - 04/06/16 1144    Subjective Pain feeling about the same as last time but Saturday was the first time I've felt relief from head ache.    Pertinent History fibromyalgia;  had previous PT neck and shoulder   Patient  Stated Goals reduce pain, turn neck instead of body   Currently in Pain? Yes   Pain Score 3    Pain Location Neck   Pain Orientation Right;Left   Pain Descriptors / Indicators Aching;Dull   Pain Type Chronic pain   Pain Onset More than a month ago   Pain Frequency Constant   Aggravating Factors  Turn head at end range                         [facility] Adult PT Treatment/Exercise - 04/06/16 0001      Neck Exercises: Seated   Neck Retraction 10 reps;3 secs   Shoulder Rolls Backwards;10 reps  No squeezzeing at end    Other Seated Exercise External rotation, shoulder extension  yellow band 2x10     Modalities   Modalities Moist Heat;Traction     Moist Heat Therapy   Number Minutes Moist Heat 15 Minutes   Moist Heat Location Shoulder  Conccurent with traction     Traction   Type of Traction Cervical   Min (lbs) 5   Max (lbs) 20   Hold Time 60   Rest Time 10  Time 15 minutes     Manual Therapy   Manual Therapy Soft tissue mobilization   Manual therapy comments Pt supine   Soft tissue mobilization Suboccipitals and cervical muscles                  PT Short Term Goals - 04/06/16 1152      PT SHORT TERM GOAL #1   Title independent with initial HEP   Time 4   Period Weeks   Status Achieved     PT SHORT TERM GOAL #2   Title headaches decreased in frequency by 25%   Time 4   Period Weeks   Status On-going     PT SHORT TERM GOAL #3   Title ability to turn her head with pain decreased >/= 25% due to increase tissue mobility   Time 4   Period Weeks   Status On-going           PT Long Term Goals - 04/06/16 1224      PT LONG TERM GOAL #1   Title independent with HEP and understand how to progress herself   Time 12   Period Weeks   Status On-going               Plan - 04/06/16 1224    Clinical Impression Statement Pt responded well to dry needling. Pt able to tolerate all strengthening well. Had a feeling of possibly muscle  spasm with scapular retractions but modified exercises for no squeeze at end range which helped. Pt had tenerness in suboccipital muscles with some relief from manual massage. Pt will continue to benefit from skilled therapy for strengthening and stabilization of nack and spine.    Rehab Potential Good   Clinical Impairments Affecting Rehab Potential Fell on 06/29/2015 resulting with fractured skull, fractured T7, fractured shoulder, had 12 weeks of [facility] Physical therapy   PT Frequency 3x / week   PT Duration 12 weeks   PT Treatment/Interventions Cryotherapy;Electrical Stimulation;Ultrasound;Traction;Moist Heat;Therapeutic activities;Therapeutic exercise;Neuromuscular re-education;Patient/family education;Passive range of motion;Manual techniques;Dry needling;Energy conservation;Taping   PT Next Visit Plan Asses responce to cervical traction, low level strengthening as tolerated, dry needling    Consulted and Agree with Plan of Care Patient      Patient will benefit from skilled therapeutic intervention in order to improve the following deficits and impairments:  Decreased range of motion, Increased fascial restricitons, Decreased endurance, Increased muscle spasms, Pain, Hypomobility, Impaired flexibility, Decreased mobility, Decreased strength  Visit Diagnosis: Other headache syndrome  Pain in thoracic spine  Other muscle spasm  Muscle weakness (generalized)     Problem List Patient Active Problem List   Diagnosis Date Noted  . Aphasia 03/13/2016  . Trigger point of left shoulder region 12/11/2015  . Fibromyalgia 12/11/2015  . Post concussion syndrome 11/01/2015  . Clavicle fracture 10/30/2015  . Hair loss 10/11/2015  . Pain in joint, shoulder region 09/11/2015  . Right wrist pain 08/07/2015  . Splinter in skin 07/30/2015  . Fall from ladder 07/30/2015  . Skull fracture with concussion ([place]) 07/30/2015  . Fracture of cervical vertebra, C6 ([place]) 07/30/2015  . Thoracic  vertebral fracture ([place]) 07/30/2015  . Abdominal pain, epigastric 05/07/2015  . Scaly patch rash 02/26/2015  . Mitral valve regurgitation 09/27/2014  . PAT (paroxysmal atrial tachycardia) ([place]) 09/06/2014  . Tachycardia 07/31/2014  . Sprain of ankle 06/29/2014  . Earache, left 05/15/2013  . Dizziness 05/15/2013  . Asthmatic bronchitis 05/08/2013  . Benign paroxysmal positional vertigo 05/08/2013  .  Chest wall pain 01/26/2013  . LUQ abdominal pain 01/20/2013  . Insomnia 01/10/2013  . Depression (emotion) 01/10/2013  . URI, acute 08/25/2012  . Snoring 08/25/2012  . IBS (irritable bowel syndrome) 02/08/2012  . Multiple pulmonary nodules 02/08/2012  . Chest heaviness 11/16/2010  . GERD 05/24/2010  . SINUSITIS, MAXILLARY, CHRONIC 05/20/2010  . Myalgia and myositis 11/20/2009  . Fatigue 11/20/2009  . ARTHRALGIA 06/26/2009  . Cervicogenic headache 06/26/2009  . TOBACCO USE, QUIT 06/26/2009  . LOW BACK PAIN 02/20/2009  . Rosacea 12/27/2008  . PRURITUS 12/27/2008  . Allergic urticaria 12/27/2008  . B12 deficiency 01/02/2008    Mikle Bosworth PTA 04/06/2016, 12:40 PM  [facility] [address] [place], [place], [address] Phone: [phone]   Fax:  [phone]  Name: Regina [name] MRN: [id number] Date of Birth: 07/01/1965

## 2016-04-07 ENCOUNTER — Telehealth: Payer: Self-pay

## 2016-04-07 ENCOUNTER — Ambulatory Visit: Payer: BC Managed Care – PPO | Admitting: Speech Pathology

## 2016-04-07 DIAGNOSIS — R87811 Vaginal high risk human papillomavirus (HPV) DNA test positive: Secondary | ICD-10-CM

## 2016-04-07 DIAGNOSIS — R41841 Cognitive communication deficit: Secondary | ICD-10-CM

## 2016-04-07 DIAGNOSIS — G4489 Other headache syndrome: Secondary | ICD-10-CM | POA: Diagnosis not present

## 2016-04-07 DIAGNOSIS — R87622 Low grade squamous intraepithelial lesion on cytologic smear of vagina (LGSIL): Secondary | ICD-10-CM

## 2016-04-07 NOTE — Therapy (Signed)
[facility] [address] [place], [place], [address] Phone: [phone]   Fax:  [phone]  Speech Language Pathology Evaluation  Patient Details  Name: Regina [name] MRN: [id number] Date of Birth: 25-Nov-1965 Referring Provider: Dr. Alger Simons  Encounter Date: 04/07/2016      End of Session - 04/07/16 1210    Visit Number 1   Number of Visits 13   Date for SLP Re-Evaluation 05/19/16   SLP Start Time 1103   SLP Stop Time  1155   SLP Time Calculation (min) 52 min   Activity Tolerance Patient tolerated treatment well      Past Medical History:  Diagnosis Date  . Allergic urticaria   . ARTHRALGIA   . Arthritis   . Atrial fibrillation ([place])   . B12 DEFICIENCY   . CFS (chronic fatigue syndrome)   . Endometriosis   . Epidural hemorrhage with loss of consciousness ([place]) 06/29/15  . Fibroid   . Fibromyalgia   . Fracture of cervical vertebra, C6 ([place]) 06/29/15  . Gastritis   . GERD   . Heart murmur   . History of endometriosis   . IBS (irritable bowel syndrome)   . Internal hemorrhoids   . MVP (mitral valve prolapse)   . Osteoarthritis   . Rosacea   . Thoracic compression fracture ([place]) 06/29/15   T2-T6    Past Surgical History:  Procedure Laterality Date  . ABDOMINAL HYSTERECTOMY     partial  . ABDOMINAL HYSTERECTOMY    . ANAL FISSURE REPAIR     Dr Rise [name] 2009  . APPENDECTOMY    . COLPOSCOPY    . HEMORRHOID SURGERY     Dr. Rise [name] 2009  . OVARIAN CYST REMOVAL    . RT BUNIONECTOMT    . TONSILLECTOMY AND ADENOIDECTOMY    . TUBAL LIGATION    . UTERINE FIBROID SURGERY      There were no vitals filed for this visit.          SLP Evaluation [facility] - 04/07/16 1140      SLP Visit Information   SLP Received On 04/07/16   Referring Provider Dr. Alger Simons   Onset Date 06/29/15   Medical Diagnosis TBI/SDH     Subjective   Patient/Family Stated Goal "to establish a new norm that I'm  comfortable with and go back to work and have success"     Pain Assessment   Currently in Pain? Yes   Pain Score 4    Pain Location Neck   Pain Orientation Right;Left   Pain Type Chronic pain   Pain Onset More than a month ago   Pain Frequency Constant     General Information   HPI Regina [name] is a 51 y.o. female coming in with complaint of right shoulder pain. Slightly displaced fracture of the right frontoparietal bone.   Behavioral/Cognition anxious   Mobility Status walks independently - denies falls. She is receiving PT at [facility] for neck pain     Prior Functional Status   Cognitive/Linguistic Baseline Within functional limits   Type of Home House    Lives With Spouse;Family   Available Support Family   Education Master's in Education   Vocation On disability     Cognition   Overall Cognitive Status Impaired/Different from baseline   Area of Impairment Memory;Attention   Attention Divided   Divided Attention Impaired   Divided Attention Impairment Verbal complex;Functional complex   Water engineer  Auditory Comprehension   Overall Auditory Comprehension Impaired   Conversation Complex   Interfering Components Processing speed   EffectiveTechniques Slowed speech;Pausing     Reading Comprehension   Reading Status Within funtional limits     Expression   Primary Mode of Expression Verbal     Verbal Expression   Overall Verbal Expression Impaired   Naming Impairment   Effective Techniques Phonemic cues     Written Expression   Dominant Hand Right   Written Expression Within Functional Limits     Standardized Assessments   Standardized Assessments  Montreal Cognitive Assessment (MOCA);[place] Naming Test-2nd edition                         SLP Education - 04/07/16 1209    Education Details goals for ST, areas of impairment, compensations for memory   Person(s) Educated Patient   Methods Explanation;Demonstration    Comprehension Verbalized understanding;Returned demonstration            SLP Long Term Goals - 04/07/16 1213      SLP LONG TERM GOAL #1   Title Pt will utilize external aids to manage schedule, lists, recurring and non recurring tasks over 3 sessions with mod I   Time 6   Period Weeks   Status New     SLP LONG TERM GOAL #2   Title Pt will divide attention between 2 complex cognitive linguistic tasks with 95% on each and supervision cues.    Time 6   Period Weeks   Status New     SLP LONG TERM GOAL #3   Title Pt will complete complex abstract naming tasks with 95% accuracy and mod I   Time 6   Period Weeks   Status New     SLP LONG TERM GOAL #4   Title Pt will utilize compensations for word finding episodes with mod I during complex conversations over 2 sessions.   Time 6   Period Weeks   Status New          Plan - 04/07/16 1227    Clinical Impression Statement Regina [name] is referred to ST due to ongoing cognitive-linguistic impairments s/p fall 30 feet from a ladder resulting in right frontal intracrainial hematoma on 06/29/15. Today, she presents with mild high level cognitive impairments of short term memory, slowed processing and divided attention. She also presents with mild dysnomia and slowed auditory processing. She scored WFL on both the Montreal Cognitive Assessment and the [place]. She recalled 3/5 words with a delay. 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 At this time,she is forgetting/missing physician appointments, forgetting where she is going and forgetting plans with herhusband. She is using a phone timer with inconsistentl success. An informal check writing/balancing task with distraction of simple conversation resulting in pt with increased anxiety and reduced processing. She stated "This was hard for  me." Indicating reduced divided attention. She demonstrated mild word finding difficulties in conversation, halting at times to find a word. Regina [name] hopes to return to her job with the [employer]. She is cooking, however she states it not how she used to, but can cook only simple menus now (She is also a Technical brewer). I recommend skilled ST for training in compensations for memory, divided attention and dysnomia for successful, eventual return to work.  Patient will benefit from skilled therapeutic intervention in order to improve the following deficits and impairments:   Cognitive communication deficit - Plan: SLP plan of care cert/re-cert    Problem List Patient Active Problem List   Diagnosis Date Noted  . Aphasia 03/13/2016  . Trigger point of left shoulder region 12/11/2015  . Fibromyalgia 12/11/2015  . Post concussion syndrome 11/01/2015  . Clavicle fracture 10/30/2015  . Hair loss 10/11/2015  . Pain in joint, shoulder region 09/11/2015  . Right wrist pain 08/07/2015  . Splinter in skin 07/30/2015  . Fall from ladder 07/30/2015  . Skull fracture with concussion ([place]) 07/30/2015  . Fracture of cervical vertebra, C6 ([place]) 07/30/2015  . Thoracic vertebral fracture ([place]) 07/30/2015  . Abdominal pain, epigastric 05/07/2015  . Scaly patch rash 02/26/2015  . Mitral valve regurgitation 09/27/2014  . PAT (paroxysmal atrial tachycardia) ([place]) 09/06/2014  . Tachycardia 07/31/2014  . Sprain of ankle 06/29/2014  . Earache, left 05/15/2013  . Dizziness 05/15/2013  . Asthmatic bronchitis 05/08/2013  . Benign paroxysmal positional vertigo 05/08/2013  . Chest wall pain 01/26/2013  . LUQ abdominal pain 01/20/2013  . Insomnia 01/10/2013  . Depression (emotion) 01/10/2013  . URI, acute 08/25/2012  . Snoring 08/25/2012  . IBS (irritable bowel syndrome) 02/08/2012  . Multiple pulmonary nodules 02/08/2012  . Chest heaviness 11/16/2010  . GERD 05/24/2010  . SINUSITIS, MAXILLARY,  CHRONIC 05/20/2010  . Myalgia and myositis 11/20/2009  . Fatigue 11/20/2009  . ARTHRALGIA 06/26/2009  . Cervicogenic headache 06/26/2009  . TOBACCO USE, QUIT 06/26/2009  . LOW BACK PAIN 02/20/2009  . Rosacea 12/27/2008  . PRURITUS 12/27/2008  . Allergic urticaria 12/27/2008  . B12 deficiency 01/02/2008    Lovvorn, Annye Rusk MS, CCC-SLP 04/07/2016, 12:40 PM  [facility] [address] [place], [place], [address] Phone: [phone]   Fax:  [phone]  Name: BRIELLA WARNE MRN: [id number] Date of Birth: 01-Sep-1965

## 2016-04-07 NOTE — Telephone Encounter (Signed)
Spoke with patient. Advised of results as seen below from [place]. Patient verbalizes understanding. Appointment for colposcopy scheduled for 04/29/2016 at 9 am with Dr.Jerston. Patient is agreeable to date and time.  Instructions given. Motrin 800 mg po x , one hour before appointment with food. Make sure to eat a meal before appointment and drink plenty of fluids. Patient verbalized understanding and will call to reschedule Patient agreeable and verbalized understanding of all instructions. Order placed for precert.   Routing to provider for final review. Patient agreeable to disposition. Will close encounter.

## 2016-04-07 NOTE — Telephone Encounter (Signed)
-----   Message from Salvadore Dom, MD sent at 04/07/2016  7:32 AM EST ----- Please let the patient know that her pap returned with LSIL, +HPV. I would recommend that she have another colposcopy from her vaginal cuff. Please set up

## 2016-04-08 ENCOUNTER — Ambulatory Visit: Payer: BC Managed Care – PPO

## 2016-04-08 DIAGNOSIS — M6281 Muscle weakness (generalized): Secondary | ICD-10-CM

## 2016-04-08 DIAGNOSIS — M546 Pain in thoracic spine: Secondary | ICD-10-CM

## 2016-04-08 DIAGNOSIS — M62838 Other muscle spasm: Secondary | ICD-10-CM

## 2016-04-08 DIAGNOSIS — G4489 Other headache syndrome: Secondary | ICD-10-CM | POA: Diagnosis not present

## 2016-04-08 NOTE — Patient Instructions (Addendum)
Suboccipital Stretch (Supine)    With small towel roll at base of skull and upper neck. Gently tuck chin until stretch is felt at base of skull and upper neck. Hold __5__ seconds. Relax. Repeat __10__ times per set. Do __1__ sets per session. Do _2-3___ sessions per day.  http://orth.exer.us/984   Copyright  VHI. All rights reserved.  Regina [name] [address] [place], [place] [address] Phone # [phone] Fax [phone]

## 2016-04-08 NOTE — Therapy (Signed)
[facility] [address] [place], [place], [address] Phone: [phone]   Fax:  [phone]  Physical Therapy Treatment  Patient Details  Name: Regina [name] MRN: [id number] Date of Birth: April 01, 1965 Referring Provider: Dr. Lew Dawes  Encounter Date: 04/08/2016      PT End of Session - 04/08/16 1011    Visit Number 4   Date for PT Re-Evaluation 06/24/16   PT Start Time 0931  dry needling   PT Stop Time 1020   PT Time Calculation (min) 49 min   Activity Tolerance Patient tolerated treatment well   Behavior During Therapy [facility] for tasks assessed/performed      Past Medical History:  Diagnosis Date  . Allergic urticaria   . ARTHRALGIA   . Arthritis   . Atrial fibrillation ([place])   . B12 DEFICIENCY   . CFS (chronic fatigue syndrome)   . Endometriosis   . Epidural hemorrhage with loss of consciousness ([place]) 06/29/15  . Fibroid   . Fibromyalgia   . Fracture of cervical vertebra, C6 ([place]) 06/29/15  . Gastritis   . GERD   . Heart murmur   . History of endometriosis   . IBS (irritable bowel syndrome)   . Internal hemorrhoids   . MVP (mitral valve prolapse)   . Osteoarthritis   . Rosacea   . Thoracic compression fracture ([place]) 06/29/15   T2-T6    Past Surgical History:  Procedure Laterality Date  . ABDOMINAL HYSTERECTOMY     partial  . ABDOMINAL HYSTERECTOMY    . ANAL FISSURE REPAIR     Dr Rise [name] 2009  . APPENDECTOMY    . COLPOSCOPY    . HEMORRHOID SURGERY     Dr. Rise [name] 2009  . OVARIAN CYST REMOVAL    . RT BUNIONECTOMT    . TONSILLECTOMY AND ADENOIDECTOMY    . TUBAL LIGATION    . UTERINE FIBROID SURGERY      There were no vitals filed for this visit.      Subjective Assessment - 04/08/16 0931    Subjective (P)  Dry needling helped me.  I want to do it again.  I didn't like traction.   Pertinent History fibromyalgia;  had previous PT neck and shoulder   Patient Stated Goals  reduce pain, turn neck instead of body   Currently in Pain? Yes   Pain Score 4    Pain Location Neck   Pain Orientation Right;Left   Pain Descriptors / Indicators Aching;Dull   Pain Type Chronic pain   Pain Onset More than a month ago   Pain Frequency Constant                         [facility] Adult PT Treatment/Exercise - 04/08/16 0001      Neck Exercises: Seated   Neck Retraction 10 reps;3 secs     Moist Heat Therapy   Number Minutes Moist Heat 10 Minutes   Moist Heat Location Cervical     Manual Therapy   Manual Therapy Soft tissue mobilization   Manual therapy comments Pt supine   Soft tissue mobilization Suboccipitals and cervical muscles     Neck Exercises: Stretches   Upper Trapezius Stretch 2 reps;20 seconds  demo cues for scapular depression          Trigger Point Dry Needling - 04/08/16 0935    Consent Given? Yes   Muscles Treated Upper Body Upper trapezius;Suboccipitals muscle group  Rt upper trap and suboccipitals, and Lt upper trap   Upper Trapezius Response Twitch reponse elicited;Palpable increased muscle length   SubOccipitals Response Twitch response elicited;Palpable increased muscle length                PT Short Term Goals - 04/06/16 1152      PT SHORT TERM GOAL #1   Title independent with initial HEP   Time 4   Period Weeks   Status Achieved     PT SHORT TERM GOAL #2   Title headaches decreased in frequency by 25%   Time 4   Period Weeks   Status On-going     PT SHORT TERM GOAL #3   Title ability to turn her head with pain decreased >/= 25% due to increase tissue mobility   Time 4   Period Weeks   Status On-going           PT Long Term Goals - 04/06/16 1224      PT LONG TERM GOAL #1   Title independent with HEP and understand how to progress herself   Time 12   Period Weeks   Status On-going               Plan - 04/08/16 0936    Clinical Impression Statement Pt with tension/trigger points in  the Lt upper trap and suboccipitals.  Pt tolerated dry needling well and demonstrated improved tissue mobility after manual and needling today.  Pt has initiated exercise for flexiblity and gentle postural strength.  Pt will continue to benefit from skilled PT for manual, modalities, and postural strength.    Rehab Potential Good   Clinical Impairments Affecting Rehab Potential Fell on 06/29/2015 resulting with fractured skull, fractured T7, fractured shoulder, had 12 weeks of [facility] Physical therapy   PT Frequency 3x / week   PT Duration 12 weeks   PT Treatment/Interventions Cryotherapy;Electrical Stimulation;Ultrasound;Traction;Moist Heat;Therapeutic activities;Therapeutic exercise;Neuromuscular re-education;Patient/family education;Passive range of motion;Manual techniques;Dry needling;Energy conservation;Taping   PT Next Visit Plan low level postural strength, dry needling next week, flexibility   Consulted and Agree with Plan of Care Patient      Patient will benefit from skilled therapeutic intervention in order to improve the following deficits and impairments:  Decreased range of motion, Increased fascial restricitons, Decreased endurance, Increased muscle spasms, Pain, Hypomobility, Impaired flexibility, Decreased mobility, Decreased strength  Visit Diagnosis: Pain in thoracic spine  Other muscle spasm  Muscle weakness (generalized)     Problem List Patient Active Problem List   Diagnosis Date Noted  . Aphasia 03/13/2016  . Trigger point of left shoulder region 12/11/2015  . Fibromyalgia 12/11/2015  . Post concussion syndrome 11/01/2015  . Clavicle fracture 10/30/2015  . Hair loss 10/11/2015  . Pain in joint, shoulder region 09/11/2015  . Right wrist pain 08/07/2015  . Splinter in skin 07/30/2015  . Fall from ladder 07/30/2015  . Skull fracture with concussion ([place]) 07/30/2015  . Fracture of cervical vertebra, C6 ([place]) 07/30/2015  . Thoracic vertebral fracture ([place])  07/30/2015  . Abdominal pain, epigastric 05/07/2015  . Scaly patch rash 02/26/2015  . Mitral valve regurgitation 09/27/2014  . PAT (paroxysmal atrial tachycardia) ([place]) 09/06/2014  . Tachycardia 07/31/2014  . Sprain of ankle 06/29/2014  . Earache, left 05/15/2013  . Dizziness 05/15/2013  . Asthmatic bronchitis 05/08/2013  . Benign paroxysmal positional vertigo 05/08/2013  . Chest wall pain 01/26/2013  . LUQ abdominal pain 01/20/2013  . Insomnia 01/10/2013  . Depression (emotion) 01/10/2013  .  URI, acute 08/25/2012  . Snoring 08/25/2012  . IBS (irritable bowel syndrome) 02/08/2012  . Multiple pulmonary nodules 02/08/2012  . Chest heaviness 11/16/2010  . GERD 05/24/2010  . SINUSITIS, MAXILLARY, CHRONIC 05/20/2010  . Myalgia and myositis 11/20/2009  . Fatigue 11/20/2009  . ARTHRALGIA 06/26/2009  . Cervicogenic headache 06/26/2009  . TOBACCO USE, QUIT 06/26/2009  . LOW BACK PAIN 02/20/2009  . Rosacea 12/27/2008  . PRURITUS 12/27/2008  . Allergic urticaria 12/27/2008  . B12 deficiency 01/02/2008     Sigurd Sos, PT 04/08/16 10:13 AM  [facility] [address] [place], [place], [address] Phone: [phone]   Fax:  [phone]  Name: Regina [name] MRN: [id number] Date of Birth: [birth year]

## 2016-04-13 ENCOUNTER — Encounter: Payer: Self-pay | Admitting: Physical Therapy

## 2016-04-13 ENCOUNTER — Ambulatory Visit: Payer: BC Managed Care – PPO | Admitting: Physical Therapy

## 2016-04-13 DIAGNOSIS — R41841 Cognitive communication deficit: Secondary | ICD-10-CM

## 2016-04-13 DIAGNOSIS — G4489 Other headache syndrome: Secondary | ICD-10-CM | POA: Diagnosis not present

## 2016-04-13 DIAGNOSIS — M546 Pain in thoracic spine: Secondary | ICD-10-CM

## 2016-04-13 DIAGNOSIS — M62838 Other muscle spasm: Secondary | ICD-10-CM

## 2016-04-13 DIAGNOSIS — M6281 Muscle weakness (generalized): Secondary | ICD-10-CM

## 2016-04-13 NOTE — Therapy (Signed)
[facility] [address], [place], [place], [address] Phone: [phone]   Fax:  [phone]  Physical Therapy Treatment  Patient Details  Name: Regina [name] MRN: [id number] Date of Birth: [birth year] Referring Provider: Dr. Lew Dawes  Encounter Date: 04/13/2016      PT End of Session - 04/13/16 0931    Visit Number 5   Date for PT Re-Evaluation 06/24/16   PT Start Time 0931   PT Stop Time 1019   PT Time Calculation (min) 48 min   Activity Tolerance Patient tolerated treatment well   Behavior During Therapy [facility] for tasks assessed/performed      Past Medical History:  Diagnosis Date  . Allergic urticaria   . ARTHRALGIA   . Arthritis   . Atrial fibrillation ([place])   . B12 DEFICIENCY   . CFS (chronic fatigue syndrome)   . Endometriosis   . Epidural hemorrhage with loss of consciousness ([place]) 06/29/15  . Fibroid   . Fibromyalgia   . Fracture of cervical vertebra, C6 ([place]) 06/29/15  . Gastritis   . GERD   . Heart murmur   . History of endometriosis   . IBS (irritable bowel syndrome)   . Internal hemorrhoids   . MVP (mitral valve prolapse)   . Osteoarthritis   . Rosacea   . Thoracic compression fracture ([place]) 06/29/15   T2-T6    Past Surgical History:  Procedure Laterality Date  . ABDOMINAL HYSTERECTOMY     partial  . ABDOMINAL HYSTERECTOMY    . ANAL FISSURE REPAIR     Dr Rise [name] 2009  . APPENDECTOMY    . COLPOSCOPY    . HEMORRHOID SURGERY     Dr. Rise [name] 2009  . OVARIAN CYST REMOVAL    . RT BUNIONECTOMT    . TONSILLECTOMY AND ADENOIDECTOMY    . TUBAL LIGATION    . UTERINE FIBROID SURGERY      There were no vitals filed for this visit.      Subjective Assessment - 04/13/16 0933    Subjective States the dry needling has been helping, states it was more sore after the previous treatment than the first one.  States she feels some improvement overall.   Pertinent History  fibromyalgia;  had previous PT neck and shoulder   Currently in Pain? Yes   Pain Score 3    Pain Location Neck   Pain Orientation Left   Pain Descriptors / Indicators Aching;Dull   Pain Type Chronic pain   Aggravating Factors  turning head   Pain Relieving Factors manual traction   Multiple Pain Sites No                         [facility] Adult PT Treatment/Exercise - 04/13/16 0001      Moist Heat Therapy   Number Minutes Moist Heat 10 Minutes   Moist Heat Location Cervical     Manual Therapy   Manual Therapy Soft tissue mobilization;Myofascial release   Manual therapy comments Pt supine   Soft tissue mobilization Suboccipitals, SCM, and cervical muscles  Rt side tighter than Lt   Myofascial Release Suboccipitals, SCM, and cervical muscles   Manual Traction traction with myofascial release x 10 minutes                  PT Short Term Goals - 04/13/16 1720      PT SHORT TERM GOAL #2   Title headaches decreased  in frequency by 25%   Time 4   Period Weeks   Status On-going     PT SHORT TERM GOAL #3   Title ability to turn her head with pain decreased >/= 25% due to increase tissue mobility   Time 4   Period Weeks   Status On-going           PT Long Term Goals - 04/06/16 1224      PT LONG TERM GOAL #1   Title independent with HEP and understand how to progress herself   Time 12   Period Weeks   Status On-going               Plan - 04/13/16 1720    Clinical Impression Statement Reduced tension and increased ROM and cervical mobility after manual and moist heat.  Pt continues to have pain and limited ROM and skilled PT needed for improved postural control inorder to return to functional activities.   Rehab Potential Good   Clinical Impairments Affecting Rehab Potential Fell on 06/29/2015 resulting with fractured skull, fractured T7, fractured shoulder, had 12 weeks of [facility] therapy   PT Frequency 3x / week   PT Duration 12  weeks   PT Treatment/Interventions Cryotherapy;Electrical Stimulation;Ultrasound;Traction;Moist Heat;Therapeutic activities;Therapeutic exercise;Neuromuscular re-education;Patient/family education;Passive range of motion;Manual techniques;Dry needling;Energy conservation;Taping   PT Next Visit Plan low level postural strength, dry needling next week, flexibility   Consulted and Agree with Plan of Care Patient      Patient will benefit from skilled therapeutic intervention in order to improve the following deficits and impairments:  Decreased range of motion, Increased fascial restricitons, Decreased endurance, Increased muscle spasms, Pain, Hypomobility, Impaired flexibility, Decreased mobility, Decreased strength  Visit Diagnosis: Pain in thoracic spine  Other muscle spasm  Muscle weakness (generalized)  Cognitive communication deficit  Other headache syndrome     Problem List Patient Active Problem List   Diagnosis Date Noted  . Aphasia 03/13/2016  . Trigger point of left shoulder region 12/11/2015  . Fibromyalgia 12/11/2015  . Post concussion syndrome 11/01/2015  . Clavicle fracture 10/30/2015  . Hair loss 10/11/2015  . Pain in joint, shoulder region 09/11/2015  . Right wrist pain 08/07/2015  . Splinter in skin 07/30/2015  . Fall from ladder 07/30/2015  . Skull fracture with concussion ([place]) 07/30/2015  . Fracture of cervical vertebra, C6 ([place]) 07/30/2015  . Thoracic vertebral fracture ([place]) 07/30/2015  . Abdominal pain, epigastric 05/07/2015  . Scaly patch rash 02/26/2015  . Mitral valve regurgitation 09/27/2014  . PAT (paroxysmal atrial tachycardia) ([place]) 09/06/2014  . Tachycardia 07/31/2014  . Sprain of ankle 06/29/2014  . Earache, left 05/15/2013  . Dizziness 05/15/2013  . Asthmatic bronchitis 05/08/2013  . Benign paroxysmal positional vertigo 05/08/2013  . Chest wall pain 01/26/2013  . LUQ abdominal pain 01/20/2013  . Insomnia 01/10/2013  . Depression  (emotion) 01/10/2013  . URI, acute 08/25/2012  . Snoring 08/25/2012  . IBS (irritable bowel syndrome) 02/08/2012  . Multiple pulmonary nodules 02/08/2012  . Chest heaviness 11/16/2010  . GERD 05/24/2010  . SINUSITIS, MAXILLARY, CHRONIC 05/20/2010  . Myalgia and myositis 11/20/2009  . Fatigue 11/20/2009  . ARTHRALGIA 06/26/2009  . Cervicogenic headache 06/26/2009  . TOBACCO USE, QUIT 06/26/2009  . LOW BACK PAIN 02/20/2009  . Rosacea 12/27/2008  . PRURITUS 12/27/2008  . Allergic urticaria 12/27/2008  . B12 deficiency 01/02/2008    Zannie Cove, PT 04/13/2016, 5:23 PM  [facility] [address],  [place], [place], [address] Phone: [phone]   Fax:  [phone]  Name: Regina [name] MRN: [id number] Date of Birth: 24-Aug-1965

## 2016-04-14 ENCOUNTER — Encounter: Payer: Self-pay | Admitting: *Deleted

## 2016-04-14 ENCOUNTER — Ambulatory Visit: Payer: BC Managed Care – PPO | Admitting: *Deleted

## 2016-04-14 DIAGNOSIS — R41841 Cognitive communication deficit: Secondary | ICD-10-CM

## 2016-04-14 DIAGNOSIS — Z0289 Encounter for other administrative examinations: Secondary | ICD-10-CM

## 2016-04-14 DIAGNOSIS — G4489 Other headache syndrome: Secondary | ICD-10-CM | POA: Diagnosis not present

## 2016-04-14 NOTE — Therapy (Signed)
[facility] [address] [place], [place], [address] Phone: [phone]   Fax:  [phone]  Speech Language Pathology Treatment  Patient Details  Name: Regina [name] MRN: [id number] Date of Birth: 11/11/1965 Referring Provider: Dr. Alger Simons  Encounter Date: 04/14/2016      End of Session - 04/14/16 1206    Visit Number 2   Number of Visits 13   Date for SLP Re-Evaluation 05/19/16   SLP Start Time 0935   SLP Stop Time  [id number]   SLP Time Calculation (min) 40 min   Activity Tolerance Patient tolerated treatment well      Past Medical History:  Diagnosis Date  . Allergic urticaria   . ARTHRALGIA   . Arthritis   . Atrial fibrillation ([place])   . B12 DEFICIENCY   . CFS (chronic fatigue syndrome)   . Endometriosis   . Epidural hemorrhage with loss of consciousness ([place]) 06/29/15  . Fibroid   . Fibromyalgia   . Fracture of cervical vertebra, C6 ([place]) 06/29/15  . Gastritis   . GERD   . Heart murmur   . History of endometriosis   . IBS (irritable bowel syndrome)   . Internal hemorrhoids   . MVP (mitral valve prolapse)   . Osteoarthritis   . Rosacea   . Thoracic compression fracture ([place]) 06/29/15   T2-T6    Past Surgical History:  Procedure Laterality Date  . ABDOMINAL HYSTERECTOMY     partial  . ABDOMINAL HYSTERECTOMY    . ANAL FISSURE REPAIR     Dr Rise [name] 2009  . APPENDECTOMY    . COLPOSCOPY    . HEMORRHOID SURGERY     Dr. Rise [name] 2009  . OVARIAN CYST REMOVAL    . RT BUNIONECTOMT    . TONSILLECTOMY AND ADENOIDECTOMY    . TUBAL LIGATION    . UTERINE FIBROID SURGERY      There were no vitals filed for this visit.      Subjective Assessment - 04/14/16 0935    Subjective I don't know what we're working on...laughs   Currently in Pain? Yes   Pain Score 2    Pain Location Head   Pain Orientation Left   Pain Descriptors / Indicators Aching;Dull   Pain Type Chronic pain   Pain Onset More  than a month ago   Aggravating Factors  turning head   Multiple Pain Sites Yes   Pain Score 3   Pain Location Back   Pain Orientation Posterior   Pain Descriptors / Indicators Penetrating   Pain Type Chronic pain   Pain Onset More than a month ago   Pain Relieving Factors pulling up on head               ADULT SLP TREATMENT - 04/14/16 0001      General Information   Behavior/Cognition Alert;Cooperative;Pleasant mood     Treatment Provided   Treatment provided Cognitive-Linquistic     Pain Assessment   Pain Assessment 0-10   Pain Score 2    Pain Location headache     Cognitive-Linquistic Treatment   Treatment focused on Cognition;Patient/family/caregiver education   Skilled Treatment Pt was seen for skilled ST session targeting cognitive goals including memory and organization. Pt reports she is "working hard on getting comfortable with her new norm", but she "gets anxious when things aren't running smoothly" or when plans change that pt does not have control over.She reports her baseline is one of a  highly organized "do-er" able to multitask without difficulty. Now she reports a short fuse and ease of frustration.  SLP and pt discussed strategies to become familiar with her physical and emotional "bank account", and to think about how she spends her energy, recognizing when she is almost "Empty", who drains and who refills her energy account.      Assessment / Recommendations / Plan   Plan Continue with current plan of care     Progression Toward Goals   Progression toward goals Progressing toward goals          SLP Education - 04/14/16 1205    Education provided Yes   Education Details strategies for managing anxiety, energy "account"   Person(s) Educated Patient   Methods Explanation;Demonstration;Handout   Comprehension Verbalized understanding            SLP Long Term Goals - 04/14/16 1211      SLP LONG TERM GOAL #1   Title Pt will utilize external  aids to manage schedule, lists, recurring and non recurring tasks over 3 sessions with mod I   Time 5   Period Weeks   Status On-going     SLP LONG TERM GOAL #2   Title Pt will divide attention between 2 complex cognitive linguistic tasks with 95% on each and supervision cues.    Time 5   Period Weeks   Status On-going     SLP LONG TERM GOAL #3   Title Pt will complete complex abstract naming tasks with 95% accuracy and mod I   Time 5   Period Weeks   Status On-going     SLP LONG TERM GOAL #4   Title Pt will utilize compensations for word finding episodes with mod I during complex conversations over 2 sessions.   Time 5   Period Weeks   Status On-going          Plan - 04/14/16 1207    Clinical Impression Statement Pt pleasant and cooperative with unfamiliar therapist. Pt able to discuss goals for therapy (improvement of recall, increasing comfort with her new norm). Pt reports feeling a lack of control over several areas of her life (currently dealing with aging parents and inlaws, her daughter/3 grandchildren,) and managing energy/handling changes. Pt open to suggestions, and would benefit from continued skilled ST intervention focusing on higher level cognition and attention.   Speech Therapy Frequency 2x / week   Duration --  6 weeks or 13 visits   Treatment/Interventions Compensatory strategies;Patient/family education;Functional tasks;Cueing hierarchy;Cognitive reorganization;SLP instruction and feedback;Internal/external aids;Language facilitation;Environmental controls   Potential to Achieve Goals Good   Potential Considerations Ability to learn/carryover information;Family/community support;Previous level of function;Cooperation/participation level   Consulted and Agree with Plan of Care Patient      Patient will benefit from skilled therapeutic intervention in order to improve the following deficits and impairments:   Cognitive communication deficit    Problem  List Patient Active Problem List   Diagnosis Date Noted  . Aphasia 03/13/2016  . Trigger point of left shoulder region 12/11/2015  . Fibromyalgia 12/11/2015  . Post concussion syndrome 11/01/2015  . Clavicle fracture 10/30/2015  . Hair loss 10/11/2015  . Pain in joint, shoulder region 09/11/2015  . Right wrist pain 08/07/2015  . Splinter in skin 07/30/2015  . Fall from ladder 07/30/2015  . Skull fracture with concussion ([place]) 07/30/2015  . Fracture of cervical vertebra, C6 ([place]) 07/30/2015  . Thoracic vertebral fracture ([place]) 07/30/2015  . Abdominal pain, epigastric 05/07/2015  .  Scaly patch rash 02/26/2015  . Mitral valve regurgitation 09/27/2014  . PAT (paroxysmal atrial tachycardia) ([place]) 09/06/2014  . Tachycardia 07/31/2014  . Sprain of ankle 06/29/2014  . Earache, left 05/15/2013  . Dizziness 05/15/2013  . Asthmatic bronchitis 05/08/2013  . Benign paroxysmal positional vertigo 05/08/2013  . Chest wall pain 01/26/2013  . LUQ abdominal pain 01/20/2013  . Insomnia 01/10/2013  . Depression (emotion) 01/10/2013  . URI, acute 08/25/2012  . Snoring 08/25/2012  . IBS (irritable bowel syndrome) 02/08/2012  . Multiple pulmonary nodules 02/08/2012  . Chest heaviness 11/16/2010  . GERD 05/24/2010  . SINUSITIS, MAXILLARY, CHRONIC 05/20/2010  . Myalgia and myositis 11/20/2009  . Fatigue 11/20/2009  . ARTHRALGIA 06/26/2009  . Cervicogenic headache 06/26/2009  . TOBACCO USE, QUIT 06/26/2009  . LOW BACK PAIN 02/20/2009  . Rosacea 12/27/2008  . PRURITUS 12/27/2008  . Allergic urticaria 12/27/2008  . B12 deficiency 01/02/2008   Celia B. Sundown, MSP, CCC-SLP  Shonna Chock 04/14/2016, 12:12 PM  [facility] [address] [place] [address] [place], [place], [address] Phone: [phone]   Fax:  [phone]   Name: Regina [name] MRN: [id number] Date of Birth: [birth year]

## 2016-04-14 NOTE — Patient Instructions (Addendum)
Strategies to manage anxiety/feelings of being overwhelmed  Compartmentalize: 1. In-laws  - Who is in charge of making her decisions? You seem to be, but you don't appear to feel that you have authority to make decisions, and it frustrates you when decisions you make are overridden. Can your husband take over managing his mother's texts to alleviate the responsibility from you?  Parents - dealing with dementia.     2. We all have a "bucket" of physical and emotional energy for each day.  Kind of like a bank account.  Just like making withdrawals from a bank account, you need to decide where to "spend" your energy. This is one thing you have control over  3. Try to think about what you DO have control over - when you're not tired, distracted, thinking about 15 other things. Write it down  4. Recognize when you are nearing the end of your energy reserve.    5. What/who drains your account emotionally? Who/what makes deposits?

## 2016-04-16 ENCOUNTER — Encounter: Payer: BC Managed Care – PPO | Admitting: *Deleted

## 2016-04-20 ENCOUNTER — Telehealth: Payer: Self-pay | Admitting: Internal Medicine

## 2016-04-20 ENCOUNTER — Encounter: Payer: Self-pay | Admitting: Neurology

## 2016-04-20 ENCOUNTER — Encounter: Payer: Self-pay | Admitting: Physical Therapy

## 2016-04-20 ENCOUNTER — Ambulatory Visit: Payer: BC Managed Care – PPO | Admitting: Physical Therapy

## 2016-04-20 ENCOUNTER — Ambulatory Visit (INDEPENDENT_AMBULATORY_CARE_PROVIDER_SITE_OTHER): Payer: BC Managed Care – PPO | Admitting: Neurology

## 2016-04-20 VITALS — BP 100/64 | HR 64 | Ht 64.0 in | Wt 169.4 lb

## 2016-04-20 DIAGNOSIS — F0781 Postconcussional syndrome: Secondary | ICD-10-CM | POA: Diagnosis not present

## 2016-04-20 DIAGNOSIS — G4489 Other headache syndrome: Secondary | ICD-10-CM | POA: Diagnosis not present

## 2016-04-20 DIAGNOSIS — M62838 Other muscle spasm: Secondary | ICD-10-CM

## 2016-04-20 DIAGNOSIS — R41841 Cognitive communication deficit: Secondary | ICD-10-CM

## 2016-04-20 DIAGNOSIS — M546 Pain in thoracic spine: Secondary | ICD-10-CM

## 2016-04-20 DIAGNOSIS — M6281 Muscle weakness (generalized): Secondary | ICD-10-CM

## 2016-04-20 MED ORDER — AMITRIPTYLINE HCL 10 MG PO TABS: 10.0000 mg | ORAL_TABLET | Freq: Every day | ORAL | 5 refills | Status: DC

## 2016-04-20 NOTE — Therapy (Signed)
[facility] [address] [place], [place], [address] Phone: [phone]   Fax:  [phone]  Physical Therapy Treatment  Patient Details  Name: Regina [name] MRN: [id number] Date of Birth: Jan 26, 1966 Referring Provider: Dr. Lew Dawes  Encounter Date: 04/20/2016      PT End of Session - 04/20/16 1028    Visit Number 6   Date for PT Re-Evaluation 06/24/16   PT Start Time 0939   PT Stop Time 1028   PT Time Calculation (min) 49 min   Activity Tolerance Patient tolerated treatment well   Behavior During Therapy [facility] for tasks assessed/performed      Past Medical History:  Diagnosis Date  . Allergic urticaria   . ARTHRALGIA   . Arthritis   . Atrial fibrillation ([place])   . B12 DEFICIENCY   . CFS (chronic fatigue syndrome)   . Endometriosis   . Epidural hemorrhage with loss of consciousness ([place]) 06/29/15  . Fibroid   . Fibromyalgia   . Fracture of cervical vertebra, C6 ([place]) 06/29/15  . Gastritis   . GERD   . Heart murmur   . History of endometriosis   . IBS (irritable bowel syndrome)   . Internal hemorrhoids   . MVP (mitral valve prolapse)   . Osteoarthritis   . Rosacea   . Thoracic compression fracture ([place]) 06/29/15   T2-T6    Past Surgical History:  Procedure Laterality Date  . ABDOMINAL HYSTERECTOMY     partial  . ABDOMINAL HYSTERECTOMY    . ANAL FISSURE REPAIR     Dr Rise [name] 2009  . APPENDECTOMY    . COLPOSCOPY    . HEMORRHOID SURGERY     Dr. Rise [name] 2009  . OVARIAN CYST REMOVAL    . RT BUNIONECTOMT    . TONSILLECTOMY AND ADENOIDECTOMY    . TUBAL LIGATION    . UTERINE FIBROID SURGERY      There were no vitals filed for this visit.      Subjective Assessment - 04/20/16 0944    Subjective Pt reports increased pain on Saturday. A little better today.    Pertinent History fibromyalgia;  had previous PT neck and shoulder   Patient Stated Goals reduce pain, turn neck instead of  body   Currently in Pain? Yes   Pain Score 3    Pain Location Head   Pain Orientation Left   Pain Descriptors / Indicators Aching;Dull   Pain Type Chronic pain   Pain Onset More than a month ago   Pain Frequency Constant   Aggravating Factors  Turning head   Pain Relieving Factors manual traction   Multiple Pain Sites Yes   Pain Score 2   Pain Location Back   Pain Orientation Posterior   Pain Descriptors / Indicators Penetrating   Pain Type Chronic pain   Pain Onset More than a month ago   Pain Frequency Intermittent                         [facility] Adult PT Treatment/Exercise - 04/20/16 0001      Neck Exercises: Seated   Neck Retraction --   Other Seated Exercise --     Neck Exercises: Supine   Neck Retraction 5 reps   Other Supine Exercise Horizontal abduction  on foam roller yellow x10     Modalities   Modalities Electrical Stimulation;Moist Heat     Moist Heat Therapy   Number  Minutes Moist Heat 15 Minutes   Moist Heat Location Cervical     Electrical Stimulation   Electrical Stimulation Location Bil upper traps   Electrical Stimulation Action Pre mod   Electrical Stimulation Parameters 72ma 6 ma   Electrical Stimulation Goals Pain     Manual Therapy   Manual Therapy Soft tissue mobilization;Myofascial release   Manual therapy comments Pt supine   Soft tissue mobilization Suboccipitals, SCM, and cervical muscles   Myofascial Release Suboccipitals                  PT Short Term Goals - 04/20/16 0946      PT SHORT TERM GOAL #2   Title headaches decreased in frequency by 25%   Time 4   Period Weeks   Status On-going     PT SHORT TERM GOAL #3   Title ability to turn her head with pain decreased >/= 25% due to increase tissue mobility   Time 4   Period Weeks   Status On-going           PT Long Term Goals - 04/20/16 0947      PT LONG TERM GOAL #2   Title ability to look upward into a cabinet with >/= 50% greater ease due  to increase mobility and decreased pain.   Time 12   Period Weeks   Status On-going     PT LONG TERM GOAL #3   Time 12   Period Weeks   Status New     PT LONG TERM GOAL #4   Title ability to vacuum, clean dishes and cook with >/= 50% greater ease due to increased strength and decreased pain   Time 12   Period Weeks   Status On-going     PT LONG TERM GOAL #5   Title FOTO score </= 45% limitation   Time 12   Period Weeks   Status On-going               Plan - 04/20/16 1023    Clinical Impression Statement Pt reports pain in neck mostly on Lt. Responded well to manual therapy. Pt stated she likes the needling and belives that helps. Pt will continue to benefit from skilled therapy for core sterengthening and postural training along with management of pain symptoms.    Rehab Potential Good   Clinical Impairments Affecting Rehab Potential Fell on 06/29/2015 resulting with fractured skull, fractured T7, fractured shoulder, had 12 weeks of [facility] Physical therapy   PT Frequency 3x / week   PT Duration 12 weeks   PT Treatment/Interventions Cryotherapy;Electrical Stimulation;Ultrasound;Traction;Moist Heat;Therapeutic activities;Therapeutic exercise;Neuromuscular re-education;Patient/family education;Passive range of motion;Manual techniques;Dry needling;Energy conservation;Taping   PT Next Visit Plan Dry needling, low level postural strengthening   Consulted and Agree with Plan of Care Patient      Patient will benefit from skilled therapeutic intervention in order to improve the following deficits and impairments:  Decreased range of motion, Increased fascial restricitons, Decreased endurance, Increased muscle spasms, Pain, Hypomobility, Impaired flexibility, Decreased mobility, Decreased strength  Visit Diagnosis: Cognitive communication deficit  Pain in thoracic spine  Other muscle spasm  Muscle weakness (generalized)  Other headache syndrome     Problem  List Patient Active Problem List   Diagnosis Date Noted  . Aphasia 03/13/2016  . Trigger point of left shoulder region 12/11/2015  . Fibromyalgia 12/11/2015  . Post concussion syndrome 11/01/2015  . Clavicle fracture 10/30/2015  . Hair loss 10/11/2015  . Pain in joint,  shoulder region 09/11/2015  . Right wrist pain 08/07/2015  . Splinter in skin 07/30/2015  . Fall from ladder 07/30/2015  . Skull fracture with concussion ([place]) 07/30/2015  . Fracture of cervical vertebra, C6 ([place]) 07/30/2015  . Thoracic vertebral fracture ([place]) 07/30/2015  . Abdominal pain, epigastric 05/07/2015  . Scaly patch rash 02/26/2015  . Mitral valve regurgitation 09/27/2014  . PAT (paroxysmal atrial tachycardia) ([place]) 09/06/2014  . Tachycardia 07/31/2014  . Sprain of ankle 06/29/2014  . Earache, left 05/15/2013  . Dizziness 05/15/2013  . Asthmatic bronchitis 05/08/2013  . Benign paroxysmal positional vertigo 05/08/2013  . Chest wall pain 01/26/2013  . LUQ abdominal pain 01/20/2013  . Insomnia 01/10/2013  . Depression (emotion) 01/10/2013  . URI, acute 08/25/2012  . Snoring 08/25/2012  . IBS (irritable bowel syndrome) 02/08/2012  . Multiple pulmonary nodules 02/08/2012  . Chest heaviness 11/16/2010  . GERD 05/24/2010  . SINUSITIS, MAXILLARY, CHRONIC 05/20/2010  . Myalgia and myositis 11/20/2009  . Fatigue 11/20/2009  . ARTHRALGIA 06/26/2009  . Cervicogenic headache 06/26/2009  . TOBACCO USE, QUIT 06/26/2009  . LOW BACK PAIN 02/20/2009  . Rosacea 12/27/2008  . PRURITUS 12/27/2008  . Allergic urticaria 12/27/2008  . B12 deficiency 01/02/2008    Mikle Bosworth PTA 04/20/2016, 10:30 AM  [facility] [address], [place], [place], [address] Phone: [phone]   Fax:  [phone]  Name: Regina [name] MRN: [id number] Date of Birth: 02-Nov-1965

## 2016-04-20 NOTE — Progress Notes (Signed)
Follow-up Visit   Date: 04/20/16    Regina [name] MRN: [id number] DOB: 09-Nov-1965   Interim History: Regina [name] is a 51 y.o. Caucasian female with atrial fibrillation, chronic fatigue syndrome, fibromyalgia, vitamin B12 deficiency, GERD returning to the clinic for follow-up of post-concussive syndrome.  The patient was accompanied to the clinic by self.  History of present illness: Patient suffered a 30 foot fall from a treehouse ladder on June 29, 2015 resulting in an acute epidural hemorrhage, slightly displaced fracture of the right frontal parietal bone, and a stable posterior fracture of the lamina at C6 bilaterally.  She also has nondisplaced fractures at T3-T7. She did not have any loss of consciousness.  She has been seeing orthopeadics at [facility] for this and her last note dated 10/24/2015 was reviewed. The notes states that they are weaning her off the brace, starting PT, and managing her work restrictions.  Since her brace has been removed, she is not sleeping well and has right sided ear discomfort.  She was initially referred for right shoulder pain, but in the interim has seen Dr. Antoine Primas, whose note indicates that she may have had a small rotator cuff tear and labral tear. She also has a clavicle fracture that is healing slowly.   She last saw Dr. Lionel December, neurosurgeon, at [facility] in May who repeated her CT scans which showed complete resolution of intracranial blood products and released her to orthopeadics for her spine issues.    Since her injury, she has been experiencing intermittent and very brief spells of dizziness. She reports having dizziness, described as if she is spinning.  She has no nausea or vomiting.  It lasts about 10-15 seconds.  It first occurred while hospitalized and was exacerbated by laying flat. Over the past few months, symptoms have become more frequent and now can be triggered by sitting up or turning, but again only lasts a  few seconds.  She has tried meclizine, but this does not help.  She also complains of right facial discomfort, as if it feels different "facial paralysis" but this has improved some.  She has not had MRI brain.  Sometimes her words are slurred, but is not noticeable.  She also complains of having a chronic headache, described as a dull ache, lasting all day and occurring nearly every day.  She reports being in a thoracic brace for four month and has been using a neck brace since July.  Her brace was removed on 7/27 and she started physical therapy this week.  UPDATE 04/20/2016:  She continues to have headaches daily, which is attributes to neck stiffness and pain.  About 2-3 times per week, she has severe sharp pain over the right temple region.  She takes ibuprofen about 2 times per week, which does not provide much relief.  She is doing PT 4 days per week for memory, shoulder pain, and neck stinffess.  She has not noticed any improvement in her dizziness. When she saw ENT, they also felt that she had BPPV. She was recommended on doing vestibular therapy, but has not had a chance to do this yet.  She complains of memory loss and feeling as if she is in a complete fog.  She often forgets her day-to-day activities.  She cannot tell if her symptoms are improving to getting worse.  She endorses a high level of anxiety.  She was given Effexor, but is not taking this. She is not taking gabapentin  due to side effects.  She does not want to start any new medications, and is very worried that her symptoms are not resolved.    Medications:  Current Outpatient Prescriptions on File Prior to Visit  Medication Sig Dispense Refill  . Acetaminophen-Codeine (TYLENOL/CODEINE #3) 300-30 MG tablet Take 0.5-1 tablets by mouth every 4 (four) hours as needed for pain. 60 tablet 1  . CASCARA SAGRADA PO Take by mouth.    . Cholecalciferol (GNP VITAMIN D) 1000 units tablet Take 2 tablets (2,000 Units total) by mouth daily. 100  tablet 3  . cyanocobalamin (,VITAMIN B-12,) 1000 MCG/ML injection Inject 1 mL (1,000 mcg total) into the skin every 14 (fourteen) days. 10 mL 11  . ergocalciferol (VITAMIN D2) 50000 units capsule Take 1 capsule (50,000 Units total) by mouth once a week. 6 capsule 0  . hydrocortisone 2.5 % cream Apply topically 2 (two) times daily. 40 g 1  . Phosphatidylserine-DHA-EPA (VAYACOG) 100-19.5-6.5 MG CAPS Take 1 capsule by mouth daily. 30 capsule 11  . SYRINGE-NEEDLE, DISP, 3 ML (BD ECLIPSE SYRINGE) 25G X 1" 3 ML MISC Use IM 50 each 11  . Turmeric 500 MG TABS Take by mouth 2 (two) times daily.    Marland Kitchen venlafaxine XR (EFFEXOR XR) 37.5 MG 24 hr capsule Take 1 capsule (37.5 mg total) by mouth daily with breakfast. 30 capsule 1  . vitamin B-12 (CYANOCOBALAMIN) 1000 MCG tablet Take 1,000 mcg by mouth daily.     No current facility-administered medications on file prior to visit.     Allergies:  Allergies  Allergen Reactions  . Indocin [Indomethacin] Swelling, Rash and Other (See Comments)    REACTION: lupus like illness; butterfly rash, pain and swelling all over body.   . Morphine Sulfate Nausea And Vomiting  . Sulfacetamide Sodium Swelling  . Caffeine Other (See Comments)    Induces A-fib  . Morphine And Related     nausea  . Sulfa Antibiotics Swelling  . Azithromycin Rash    ALL MYCIN DRUGS  . Erythromycin Stearate Hives and Swelling    REACTION: all mycins    Review of Systems:  CONSTITUTIONAL: No fevers, chills, night sweats, or weight loss.  EYES: No visual changes or eye pain ENT: No hearing changes.  No history of nose bleeds.   RESPIRATORY: No cough, wheezing and shortness of breath.   CARDIOVASCULAR: Negative for chest pain, and palpitations.   GI: Negative for abdominal discomfort, blood in stools or black stools.  No recent change in bowel habits.   GU:  No history of incontinence.   MUSCLOSKELETAL: No history of joint pain or swelling.  No myalgias.   SKIN: Negative for lesions,  rash, and itching.   ENDOCRINE: Negative for cold or heat intolerance, polydipsia or goiter.   PSYCH:  No depression + anxiety symptoms.   NEURO: As Above.   Vital Signs:  BP 100/64   Pulse 64   Ht 5\' 4"  (1.626 m)   Wt 169 lb 6 oz (76.8 kg)   SpO2 97%   BMI 29.07 kg/m   Neurological Exam: MENTAL STATUS including orientation to time, place, person, recent and remote memory, attention span and concentration, language, and fund of knowledge is normal.  Speech is not dysarthric.  CRANIAL NERVES: No visual field defects. Pupils equal round and reactive to light.  Normal conjugate, extra-ocular eye movements in all directions of gaze.  No ptosis. Normal facial sensation.  Face is symmetric. Palate elevates symmetrically.  Tongue is midline.  Montreal Cognitive Assessment  04/20/2016  Visuospatial/ Executive (0/5) 5  Naming (0/3) 3  Attention: Read list of digits (0/2) 2  Attention: Read list of letters (0/1) 1  Attention: Serial 7 subtraction starting at 100 (0/3) 3  Language: Repeat phrase (0/2) 2  Language : Fluency (0/1) 1  Abstraction (0/2) 2  Delayed Recall (0/5) 4  Orientation (0/6) 6  Total 29  Adjusted Score (based on education) 29   MOTOR:  Motor strength is 5/5 in all extremities.  No pronator drift.  Tone is normal.    MSRs:  Brisk reflexes are 3+/4 throughout  SENSORY:  Intact to vibration.  COORDINATION/GAIT: Gait narrow based and stable.   Data: CT cervical spine and head 06/29/2015: 1. Acute epidural hemorrhage overlying the right frontoparietal lobe, measuring 9 mm greatest thickness with slight mass effect on the underlying frontoparietal lobe parenchyma. No appreciable midline shift. 2. Slightly displaced fracture of the right frontoparietal bone, at the site of the acute epidural hemorrhage described above. Scalp hematoma overlies this right frontoparietal bone fracture.  3. Minimally displaced, obliquely oriented fracture involving the bilateral posterior  lamina of C6. This configuration suggests stable fracture. No fracture extension into the C6 facets or pedicles. No vertebral body fracture at this or any other level. Remainder of the cervical spine appears intact and normally aligned.  IMPRESSION/PLAN: Pont-concussive syndrome manifesting with headaches, memory loss, and dizziness.  She certainly has a component of BPPV which may be contributing to her dizziness, which was also mentioned by ENT  - Long discussion regarding management options including PT and medications.  She was very reluctant to start any medications and finally agreed to give amitriptyline a try.  She will start amitriptyline 10mg  at bedtime; this can be uptitrated in 4 weeks, as needed.  - Formal neuropsychological testing will be ordered to get baseline assessment of his cognitive impairment.  She scored very well on her MOCA today 29/30  - Continue physical therapy.  She will benefit with vestibular therapy which was also recommended by ENT, but has not started this  - She was encouraged to use lists and reminders in her smart phone to minimize forgetting tasks  - She had many questions regarding her condition and prognosis which was answered.  There is a high level of anxiety regarding her medical condition and she may benefit from seeing a behavior counselor   Return to clinic in 6 months    The duration of this appointment visit was 30 minutes of face-to-face time with the patient.  Greater than 50% of this time was spent in counseling, explanation of diagnosis, planning of further management, and coordination of care.   Thank you for allowing me to participate in patient's care.  If I can answer any additional questions, I would be pleased to do so.    Sincerely,    Jewelz Kobus K. Allena Katz, DO

## 2016-04-20 NOTE — Telephone Encounter (Signed)
04/14/2016 Faxed long term disability ppwk to Yevonne Aline at [employer]. Left voicemail for Quillian Quince advising. Received fax confirmation and pt aware. Quillian Quince also requested medical records, confirmed with HIM that record request was received 04/07/16 and takes approx 10 days to complete. Daniel/[employer] Disability and pt aware of this as well.

## 2016-04-20 NOTE — Patient Instructions (Addendum)
1.  Formal neuropsychological testing 2.  Continue physical therapy 3.  Start amitriptyline 10mg  at bedtime  4.  Call with an update in 1 month  Return to clinic in 6 month

## 2016-04-21 ENCOUNTER — Ambulatory Visit: Payer: BC Managed Care – PPO | Admitting: Speech Pathology

## 2016-04-21 DIAGNOSIS — R41841 Cognitive communication deficit: Secondary | ICD-10-CM

## 2016-04-21 DIAGNOSIS — G4489 Other headache syndrome: Secondary | ICD-10-CM | POA: Diagnosis not present

## 2016-04-21 NOTE — Therapy (Signed)
[facility] [address] [place], [place], [address] Phone: [phone]   Fax:  [phone]  Speech Language Pathology Treatment  Patient Details  Name: Regina [name] MRN: [id number] Date of Birth: November 22, 1965 Referring Provider: Dr. Faith Rogue  Encounter Date: 04/21/2016      End of Session - 04/21/16 1108    Visit Number 3   Number of Visits 13   Date for SLP Re-Evaluation 05/19/16   SLP Start Time 0931   SLP Stop Time  1016   SLP Time Calculation (min) 45 min   Activity Tolerance Patient tolerated treatment well      Past Medical History:  Diagnosis Date  . Allergic urticaria   . ARTHRALGIA   . Arthritis   . Atrial fibrillation (HCC)   . B12 DEFICIENCY   . CFS (chronic fatigue syndrome)   . Endometriosis   . Epidural hemorrhage with loss of consciousness (HCC) 06/29/15  . Fibroid   . Fibromyalgia   . Fracture of cervical vertebra, C6 (HCC) 06/29/15  . Gastritis   . GERD   . Heart murmur   . History of endometriosis   . IBS (irritable bowel syndrome)   . Internal hemorrhoids   . MVP (mitral valve prolapse)   . Osteoarthritis   . Rosacea   . Thoracic compression fracture (HCC) 06/29/15   T2-T6    Past Surgical History:  Procedure Laterality Date  . ABDOMINAL HYSTERECTOMY     partial  . ABDOMINAL HYSTERECTOMY    . ANAL FISSURE REPAIR     Dr Zachery Dakins 2009  . APPENDECTOMY    . COLPOSCOPY    . HEMORRHOID SURGERY     Dr. Zachery Dakins 2009  . OVARIAN CYST REMOVAL    . RT BUNIONECTOMT    . TONSILLECTOMY AND ADENOIDECTOMY    . TUBAL LIGATION    . UTERINE FIBROID SURGERY      There were no vitals filed for this visit.             ADULT SLP TREATMENT - 04/21/16 0947      General Information   Behavior/Cognition Alert;Cooperative;Pleasant mood     Pain Assessment   Pain Location headache   Pain Descriptors / Indicators Aching     Cognitive-Linquistic Treatment   Treatment focused  on Cognition;Patient/family/caregiver education   Skilled Treatment Pt reports carrying over memory compensations of using her phone to recall appointments. She is having difficulty putting in every event in her phone as some are spontaneous.  Trained pt in visual memory strategies/activities she can practice at home due to h/o photographic memory, with occasional min cues. Pt verbalizes surprise at her difficulty with this task.  Divided attention between mildly complex task  with  95% on functional written math; 90% on auditory/verbal task with rare min A.      Assessment / Recommendations / Plan   Plan Continue with current plan of care     Progression Toward Goals   Progression toward goals Progressing toward goals          SLP Education - 04/21/16 1106    Education provided Yes   Education Details Cognitive activities to do at home   Person(s) Educated Patient   Methods Explanation;Demonstration;Handout   Comprehension Verbalized understanding            SLP Long Term Goals - 04/21/16 1108      SLP LONG TERM GOAL #1   Title Pt will utilize external aids  to manage schedule, lists, recurring and non recurring tasks over 3 sessions with mod I   Time 4   Period Weeks   Status On-going     SLP LONG TERM GOAL #2   Title Pt will divide attention between 2 complex cognitive linguistic tasks with 95% on each and supervision cues.    Time 4   Period Weeks   Status On-going     SLP LONG TERM GOAL #3   Title Pt will complete complex abstract naming tasks with 95% accuracy and mod I   Time 4   Period Weeks   Status On-going     SLP LONG TERM GOAL #4   Title Pt will utilize compensations for word finding episodes with mod I during complex conversations over 2 sessions.   Time 4   Period Weeks   Status On-going          Plan - 04/21/16 1107    Clinical Impression Statement Pt required min A for divided attention on mildly complex tasks and occasional min A on visual  memory tasks. Continue skilled ST to maximize cognition and word finding for possible return to work.    Speech Therapy Frequency 2x / week   Treatment/Interventions Compensatory strategies;Patient/family education;Functional tasks;Cueing hierarchy;Cognitive reorganization;SLP instruction and feedback;Internal/external aids;Language facilitation;Environmental controls   Potential to Achieve Goals Good   Potential Considerations Ability to learn/carryover information;Family/community support;Previous level of function;Cooperation/participation level   Consulted and Agree with Plan of Care Patient      Patient will benefit from skilled therapeutic intervention in order to improve the following deficits and impairments:   Cognitive communication deficit    Problem List Patient Active Problem List   Diagnosis Date Noted  . Aphasia 03/13/2016  . Trigger point of left shoulder region 12/11/2015  . Fibromyalgia 12/11/2015  . Post concussion syndrome 11/01/2015  . Clavicle fracture 10/30/2015  . Hair loss 10/11/2015  . Pain in joint, shoulder region 09/11/2015  . Right wrist pain 08/07/2015  . Splinter in skin 07/30/2015  . Fall from ladder 07/30/2015  . Skull fracture with concussion (HCC) 07/30/2015  . Fracture of cervical vertebra, C6 (HCC) 07/30/2015  . Thoracic vertebral fracture (HCC) 07/30/2015  . Abdominal pain, epigastric 05/07/2015  . Scaly patch rash 02/26/2015  . Mitral valve regurgitation 09/27/2014  . PAT (paroxysmal atrial tachycardia) (HCC) 09/06/2014  . Tachycardia 07/31/2014  . Sprain of ankle 06/29/2014  . Earache, left 05/15/2013  . Dizziness 05/15/2013  . Asthmatic bronchitis 05/08/2013  . Benign paroxysmal positional vertigo 05/08/2013  . Chest wall pain 01/26/2013  . LUQ abdominal pain 01/20/2013  . Insomnia 01/10/2013  . Depression (emotion) 01/10/2013  . URI, acute 08/25/2012  . Snoring 08/25/2012  . IBS (irritable bowel syndrome) 02/08/2012  . Multiple  pulmonary nodules 02/08/2012  . Chest heaviness 11/16/2010  . GERD 05/24/2010  . SINUSITIS, MAXILLARY, CHRONIC 05/20/2010  . Myalgia and myositis 11/20/2009  . Fatigue 11/20/2009  . ARTHRALGIA 06/26/2009  . Cervicogenic headache 06/26/2009  . TOBACCO USE, QUIT 06/26/2009  . LOW BACK PAIN 02/20/2009  . Rosacea 12/27/2008  . PRURITUS 12/27/2008  . Allergic urticaria 12/27/2008  . B12 deficiency 01/02/2008    Regina [name], Regina [name], Regina [name] 04/21/2016, 12:11 PM  [facility] [address] [place], [place], [address] Phone: [phone]   Fax:  [phone]   Name: Regina [name] MRN: [id number] Date of Birth: [birth year]

## 2016-04-21 NOTE — Patient Instructions (Signed)
   Cognitive Activities you can do at home:   - Solitaire  - Majong  - Scrabble  - Chess/Checkers  - Crosswords (easy level)  - Uno  - Card Games  - Board Games  - Connect 4  - Simon  - the Memory Game  On your computer, tablet or phone: BrainHQ Brainbashers.[employer] Neuronation App Memory Match Game App Rush Hour Chocolate Fix Sort it out Scrabble pics App Cracker Barrel App Photo Quiz App MixTwo App What's the Word?App  

## 2016-04-22 ENCOUNTER — Encounter: Payer: Self-pay | Admitting: Physical Therapy

## 2016-04-22 ENCOUNTER — Ambulatory Visit: Payer: BC Managed Care – PPO | Admitting: Physical Therapy

## 2016-04-22 DIAGNOSIS — G4489 Other headache syndrome: Secondary | ICD-10-CM | POA: Diagnosis not present

## 2016-04-22 DIAGNOSIS — R41841 Cognitive communication deficit: Secondary | ICD-10-CM

## 2016-04-22 DIAGNOSIS — M546 Pain in thoracic spine: Secondary | ICD-10-CM

## 2016-04-22 DIAGNOSIS — M6281 Muscle weakness (generalized): Secondary | ICD-10-CM

## 2016-04-22 DIAGNOSIS — M62838 Other muscle spasm: Secondary | ICD-10-CM

## 2016-04-22 NOTE — Therapy (Signed)
[facility] [address] [place], [place], [address] Phone: [phone]   Fax:  [phone]  Physical Therapy Treatment  Patient Details  Name: Regina [name] MRN: [id number] Date of Birth: [birth year] Referring Provider: Dr. Lew Dawes  Encounter Date: 04/22/2016      PT End of Session - 04/22/16 1015    Visit Number 7   Date for PT Re-Evaluation 06/24/16   PT Start Time 0930   PT Stop Time 1030   PT Time Calculation (min) 60 min   Activity Tolerance Patient tolerated treatment well   Behavior During Therapy [facility] for tasks assessed/performed      Past Medical History:  Diagnosis Date  . Allergic urticaria   . ARTHRALGIA   . Arthritis   . Atrial fibrillation ([place])   . B12 DEFICIENCY   . CFS (chronic fatigue syndrome)   . Endometriosis   . Epidural hemorrhage with loss of consciousness ([place]) 06/29/15  . Fibroid   . Fibromyalgia   . Fracture of cervical vertebra, C6 ([place]) 06/29/15  . Gastritis   . GERD   . Heart murmur   . History of endometriosis   . IBS (irritable bowel syndrome)   . Internal hemorrhoids   . MVP (mitral valve prolapse)   . Osteoarthritis   . Rosacea   . Thoracic compression fracture ([place]) 06/29/15   T2-T6    Past Surgical History:  Procedure Laterality Date  . ABDOMINAL HYSTERECTOMY     partial  . ABDOMINAL HYSTERECTOMY    . ANAL FISSURE REPAIR     Dr Rise [name] 2009  . APPENDECTOMY    . COLPOSCOPY    . HEMORRHOID SURGERY     Dr. Rise [name] 2009  . OVARIAN CYST REMOVAL    . RT BUNIONECTOMT    . TONSILLECTOMY AND ADENOIDECTOMY    . TUBAL LIGATION    . UTERINE FIBROID SURGERY      There were no vitals filed for this visit.      Subjective Assessment - 04/22/16 0939    Subjective Dry needling is helping. I do not feel that great today. I have a headache today.    Pertinent History fibromyalgia;  had previous PT neck and shoulder   Patient Stated Goals reduce pain,  turn neck instead of body   Currently in Pain? Yes   Pain Score 4    Pain Location Neck   Pain Orientation Right;Left   Pain Descriptors / Indicators Aching;Dull   Pain Type Chronic pain   Pain Onset More than a month ago   Pain Frequency Constant   Aggravating Factors  turning head   Pain Relieving Factors manual traction   Multiple Pain Sites No                         [facility] Adult PT Treatment/Exercise - 04/22/16 0001      Neck Exercises: Supine   Other Supine Exercise lay on foam roll, decompress 2 min; alterante shoulder flexion 10x; squeeze shoulder blades 5 sec 10x; retract head into roll 5 sec 10x;      Modalities   Modalities Electrical Stimulation;Moist Heat     Moist Heat Therapy   Number Minutes Moist Heat 20 Minutes   Moist Heat Location Cervical;Lumbar Spine     Electrical Stimulation   Electrical Stimulation Location Bil upper traps   Electrical Stimulation Action IFC   Electrical Stimulation Parameters 20 min;to patient tolerance  Electrical Stimulation Goals Pain     Manual Therapy   Manual Therapy Soft tissue mobilization;Myofascial release   Manual therapy comments Pt supine   Soft tissue mobilization Suboccipitals, SCM, and cervical muscles; scalenes; lateral pterygoid muscle; platysimus; masseter   Myofascial Release Suboccipitals; ear pull on right                PT Education - 04/22/16 1015    Education provided Yes   Education Details ear pull   Person(s) Educated Patient   Methods Explanation;Demonstration   Comprehension Verbalized understanding;Returned demonstration          PT Short Term Goals - 04/22/16 0941      PT SHORT TERM GOAL #1   Title independent with initial HEP   Time 4   Period Weeks   Status Achieved     PT SHORT TERM GOAL #2   Title headaches decreased in frequency by 25%   Time 4   Period Weeks   Status On-going  no change     PT SHORT TERM GOAL #3   Title ability to turn her head  with pain decreased >/= 25% due to increase tissue mobility   Time 4   Period Weeks   Status On-going  dependes on the day           PT Long Term Goals - 04/20/16 0947      PT LONG TERM GOAL #2   Title ability to look upward into a cabinet with >/= 50% greater ease due to increase mobility and decreased pain.   Time 12   Period Weeks   Status On-going     PT LONG TERM GOAL #3   Time 12   Period Weeks   Status New     PT LONG TERM GOAL #4   Title ability to vacuum, clean dishes and cook with >/= 50% greater ease due to increased strength and decreased pain   Time 12   Period Weeks   Status On-going     PT LONG TERM GOAL #5   Title FOTO score </= 45% limitation   Time 12   Period Weeks   Status On-going               Plan - 04/22/16 1016    Clinical Impression Statement Patient reports her headaches have not changed.  Patient has more days with less pain.  Patient feels the dry needling is helping her.  Patient has many restrictions in the anterior cervical muscles and pterygoid.  Patient had decreased pain in right ear after ear pull. Patient is progressing slowly toward her goals.  Patient will benefit from skilled therapy for core strengthening and postural training along with management of pain symptoms.    Rehab Potential Good   Clinical Impairments Affecting Rehab Potential Fell on 06/29/2015 resulting with fractured skull, fractured T7, fractured shoulder, had 12 weeks of [facility] therapy   PT Frequency 3x / week   PT Duration 12 weeks   PT Treatment/Interventions Cryotherapy;Electrical Stimulation;Ultrasound;Traction;Moist Heat;Therapeutic activities;Therapeutic exercise;Neuromuscular re-education;Patient/family education;Passive range of motion;Manual techniques;Dry needling;Energy conservation;Taping   PT Next Visit Plan Dry needling, low level postural strengthening; soft tissue work around the cervical; jaw, scalenes; anterior cervical;   PT Home  Exercise Plan cervical stabilization exercises   Consulted and Agree with Plan of Care Patient      Patient will benefit from skilled therapeutic intervention in order to improve the following deficits and impairments:  Decreased range of motion,  Increased fascial restricitons, Decreased endurance, Increased muscle spasms, Pain, Hypomobility, Impaired flexibility, Decreased mobility, Decreased strength  Visit Diagnosis: Cognitive communication deficit  Pain in thoracic spine  Other muscle spasm  Muscle weakness (generalized)  Other headache syndrome     Problem List Patient Active Problem List   Diagnosis Date Noted  . Aphasia 03/13/2016  . Trigger point of left shoulder region 12/11/2015  . Fibromyalgia 12/11/2015  . Post concussion syndrome 11/01/2015  . Clavicle fracture 10/30/2015  . Hair loss 10/11/2015  . Pain in joint, shoulder region 09/11/2015  . Right wrist pain 08/07/2015  . Splinter in skin 07/30/2015  . Fall from ladder 07/30/2015  . Skull fracture with concussion ([place]) 07/30/2015  . Fracture of cervical vertebra, C6 ([place]) 07/30/2015  . Thoracic vertebral fracture ([place]) 07/30/2015  . Abdominal pain, epigastric 05/07/2015  . Scaly patch rash 02/26/2015  . Mitral valve regurgitation 09/27/2014  . PAT (paroxysmal atrial tachycardia) ([place]) 09/06/2014  . Tachycardia 07/31/2014  . Sprain of ankle 06/29/2014  . Earache, left 05/15/2013  . Dizziness 05/15/2013  . Asthmatic bronchitis 05/08/2013  . Benign paroxysmal positional vertigo 05/08/2013  . Chest wall pain 01/26/2013  . LUQ abdominal pain 01/20/2013  . Insomnia 01/10/2013  . Depression (emotion) 01/10/2013  . URI, acute 08/25/2012  . Snoring 08/25/2012  . IBS (irritable bowel syndrome) 02/08/2012  . Multiple pulmonary nodules 02/08/2012  . Chest heaviness 11/16/2010  . GERD 05/24/2010  . SINUSITIS, MAXILLARY, CHRONIC 05/20/2010  . Myalgia and myositis 11/20/2009  . Fatigue 11/20/2009  .  ARTHRALGIA 06/26/2009  . Cervicogenic headache 06/26/2009  . TOBACCO USE, QUIT 06/26/2009  . LOW BACK PAIN 02/20/2009  . Rosacea 12/27/2008  . PRURITUS 12/27/2008  . Allergic urticaria 12/27/2008  . B12 deficiency 01/02/2008    Earlie Counts, PT 04/22/16 12:20 PM   [facility] [address] [place], [place], [address] Phone: [phone]   Fax:  [phone]  Name: Regina [name] MRN: [id number] Date of Birth: 05-22-1965

## 2016-04-23 ENCOUNTER — Ambulatory Visit: Payer: BC Managed Care – PPO | Admitting: *Deleted

## 2016-04-23 ENCOUNTER — Encounter: Payer: Self-pay | Admitting: *Deleted

## 2016-04-23 DIAGNOSIS — R41841 Cognitive communication deficit: Secondary | ICD-10-CM

## 2016-04-23 DIAGNOSIS — G4489 Other headache syndrome: Secondary | ICD-10-CM | POA: Diagnosis not present

## 2016-04-23 NOTE — Therapy (Signed)
[facility] [address] [place], [place], [address] Phone: [phone]   Fax:  [phone]  Speech Language Pathology Treatment  Patient Details  Name: Regina STENERSON MRN: [id number] Date of Birth: 1966-01-05 Referring Provider: Dr. Alger Simons  Encounter Date: 04/23/2016      End of Session - 04/23/16 1324    Visit Number 4   Number of Visits 13   Date for SLP Re-Evaluation 05/19/16   SLP Start Time 0935   SLP Stop Time  [id number]   SLP Time Calculation (min) 40 min   Activity Tolerance Patient tolerated treatment well      Past Medical History:  Diagnosis Date  . Allergic urticaria   . ARTHRALGIA   . Arthritis   . Atrial fibrillation ([place])   . B12 DEFICIENCY   . CFS (chronic fatigue syndrome)   . Endometriosis   . Epidural hemorrhage with loss of consciousness ([place]) 06/29/15  . Fibroid   . Fibromyalgia   . Fracture of cervical vertebra, C6 ([place]) 06/29/15  . Gastritis   . GERD   . Heart murmur   . History of endometriosis   . IBS (irritable bowel syndrome)   . Internal hemorrhoids   . MVP (mitral valve prolapse)   . Osteoarthritis   . Rosacea   . Thoracic compression fracture ([place]) 06/29/15   T2-T6    Past Surgical History:  Procedure Laterality Date  . ABDOMINAL HYSTERECTOMY     partial  . ABDOMINAL HYSTERECTOMY    . ANAL FISSURE REPAIR     Dr Rise [name] 2009  . APPENDECTOMY    . COLPOSCOPY    . HEMORRHOID SURGERY     Dr. Rise [name] 2009  . OVARIAN CYST REMOVAL    . RT BUNIONECTOMT    . TONSILLECTOMY AND ADENOIDECTOMY    . TUBAL LIGATION    . UTERINE FIBROID SURGERY      There were no vitals filed for this visit.      Subjective Assessment - 04/23/16 0937    Subjective I'm sore from therapy yesterday. It was the best session I had had, but I'll probably never see her again.   Currently in Pain? Yes   Pain Score 4    Pain Location Neck   Pain Orientation Right   Pain Descriptors /  Indicators Aching;Dull   Pain Type Chronic pain   Pain Onset More than a month ago   Pain Frequency Constant   Aggravating Factors  turning head   Pain Relieving Factors traction   Multiple Pain Sites Yes   Pain Score 5   Pain Location Shoulder  cape   Pain Orientation Posterior   Pain Descriptors / Indicators Sore   Pain Onset More than a month ago   Pain Frequency Constant   Aggravating Factors  turning head   Pain Relieving Factors traction               ADULT SLP TREATMENT - 04/23/16 0001      General Information   Behavior/Cognition Alert;Cooperative;Pleasant mood     Treatment Provided   Treatment provided Cognitive-Linquistic     Pain Assessment   Pain Assessment 0-10   Pain Score 4    Pain Location neck and "cape"   Pain Descriptors / Indicators Sore     Cognitive-Linquistic Treatment   Treatment focused on Cognition;Patient/family/caregiver education   Skilled Treatment Pt seen for skilled ST session targeting cognitive goals, including use of external aids to facilitate  management of functional activities, divided attention, abstracat naming tasks and complex word finding. Pt reports using phone, to-do lists, and calendars to keep track of activities. She indicates having 3 calendars, but is able to manage them all and keep them accurately. Pt returned homework to complete naming category members while keeping track of TV commercials. Pt completed naming task with 90% success, and wrote down several commercials she saw while completing the task. Pt reports her "short fuse" is better, with less ease of frustration. She reports being annoyed at having to have alarms for everything, and keeping a stable predictable routine. Pt was given homework to complete high level deductive reasoning matrixes, and  a ":jumble" word puzzle. Pt quickly figured out 4/5 jumbled words in minutes, and did not exhibit word retrieval difficulty during this session.     Assessment /  Recommendations / Plan   Plan Continue with current plan of care     Progression Toward Goals   Progression toward goals Progressing toward goals          SLP Education - 04/23/16 1323    Education provided Yes   Education Details reasoning matrixes, jumble   Person(s) Educated Patient   Methods Explanation;Demonstration   Comprehension Verbalized understanding;Returned demonstration            SLP Long Term Goals - 04/23/16 1327      SLP LONG TERM GOAL #1   Title Pt will utilize external aids to manage schedule, lists, recurring and non recurring tasks over 3 sessions with mod I   Baseline 1/25   Time 4   Period Weeks   Status On-going     SLP LONG TERM GOAL #2   Title Pt will divide attention between 2 complex cognitive linguistic tasks with 95% on each and supervision cues.    Time 4   Period Weeks   Status On-going     SLP LONG TERM GOAL #3   Title Pt will complete complex abstract naming tasks with 95% accuracy and mod I   Time 4   Period Weeks   Status On-going     SLP LONG TERM GOAL #4   Title Pt will utilize compensations for word finding episodes with mod I during complex conversations over 2 sessions.   Baseline no observed issues with word finding 1/25   Time 4   Period Weeks   Status On-going          Plan - 04/23/16 1325    Clinical Impression Statement Pt  reports using external aids (phone, to-do lists, calendars) to manage daily activities. She has implemented strategies to compartmentalize and identify where she is spending her "bucket" of emotional and physical energy, and reports improvement in frustration tolerance. Given high level of functioning prior to her fall, continued skilled ST intervention is recommended to improve pt level of function and quality of life.   Speech Therapy Frequency 2x / week   Duration --  6 weeks or 13 visits   Treatment/Interventions Compensatory strategies;Patient/family education;Functional tasks;Cueing  hierarchy;Cognitive reorganization;SLP instruction and feedback;Internal/external aids;Language facilitation;Environmental controls   Potential to Achieve Goals Good   Potential Considerations Ability to learn/carryover information;Family/community support;Previous level of function;Cooperation/participation level   Consulted and Agree with Plan of Care Patient      Patient will benefit from skilled therapeutic intervention in order to improve the following deficits and impairments:   Cognitive communication deficit    Problem List Patient Active Problem List   Diagnosis Date Noted  .  Aphasia 03/13/2016  . Trigger point of left shoulder region 12/11/2015  . Fibromyalgia 12/11/2015  . Post concussion syndrome 11/01/2015  . Clavicle fracture 10/30/2015  . Hair loss 10/11/2015  . Pain in joint, shoulder region 09/11/2015  . Right wrist pain 08/07/2015  . Splinter in skin 07/30/2015  . Fall from ladder 07/30/2015  . Skull fracture with concussion ([place]) 07/30/2015  . Fracture of cervical vertebra, C6 ([place]) 07/30/2015  . Thoracic vertebral fracture ([place]) 07/30/2015  . Abdominal pain, epigastric 05/07/2015  . Scaly patch rash 02/26/2015  . Mitral valve regurgitation 09/27/2014  . PAT (paroxysmal atrial tachycardia) ([place]) 09/06/2014  . Tachycardia 07/31/2014  . Sprain of ankle 06/29/2014  . Earache, left 05/15/2013  . Dizziness 05/15/2013  . Asthmatic bronchitis 05/08/2013  . Benign paroxysmal positional vertigo 05/08/2013  . Chest wall pain 01/26/2013  . LUQ abdominal pain 01/20/2013  . Insomnia 01/10/2013  . Depression (emotion) 01/10/2013  . URI, acute 08/25/2012  . Snoring 08/25/2012  . IBS (irritable bowel syndrome) 02/08/2012  . Multiple pulmonary nodules 02/08/2012  . Chest heaviness 11/16/2010  . GERD 05/24/2010  . SINUSITIS, MAXILLARY, CHRONIC 05/20/2010  . Myalgia and myositis 11/20/2009  . Fatigue 11/20/2009  . ARTHRALGIA 06/26/2009  . Cervicogenic headache  06/26/2009  . TOBACCO USE, QUIT 06/26/2009  . LOW BACK PAIN 02/20/2009  . Rosacea 12/27/2008  . PRURITUS 12/27/2008  . Allergic urticaria 12/27/2008  . B12 deficiency 01/02/2008   Celia B. [place], MSP, CCC-SLP  Shonna Chock 04/23/2016, 1:30 PM  [facility] [address] [place], [place], [address] Phone: [phone]   Fax:  [phone]   Name: JONYA INGLETT MRN: [id number] Date of Birth: May 03, 1965

## 2016-04-27 ENCOUNTER — Encounter: Payer: Self-pay | Admitting: Physical Therapy

## 2016-04-27 ENCOUNTER — Ambulatory Visit: Payer: BC Managed Care – PPO | Admitting: Physical Therapy

## 2016-04-27 DIAGNOSIS — M546 Pain in thoracic spine: Secondary | ICD-10-CM

## 2016-04-27 DIAGNOSIS — G4489 Other headache syndrome: Secondary | ICD-10-CM

## 2016-04-27 DIAGNOSIS — M6281 Muscle weakness (generalized): Secondary | ICD-10-CM

## 2016-04-27 DIAGNOSIS — M62838 Other muscle spasm: Secondary | ICD-10-CM

## 2016-04-27 NOTE — Therapy (Signed)
[facility] [address] [place], [place], [address] Phone: [phone]   Fax:  [phone]  Physical Therapy Treatment  Patient Details  Name: Regina [name] MRN: [id number] Date of Birth: 1966/02/24 Referring Provider: Dr. Lew Dawes  Encounter Date: 04/27/2016      PT End of Session - 04/27/16 0936    Visit Number 8   Date for PT Re-Evaluation 06/24/16   PT Start Time 0930   PT Stop Time 1020   PT Time Calculation (min) 50 min   Activity Tolerance Patient tolerated treatment well   Behavior During Therapy [facility] for tasks assessed/performed      Past Medical History:  Diagnosis Date  . Allergic urticaria   . ARTHRALGIA   . Arthritis   . Atrial fibrillation ([place])   . B12 DEFICIENCY   . CFS (chronic fatigue syndrome)   . Endometriosis   . Epidural hemorrhage with loss of consciousness ([place]) 06/29/15  . Fibroid   . Fibromyalgia   . Fracture of cervical vertebra, C6 ([place]) 06/29/15  . Gastritis   . GERD   . Heart murmur   . History of endometriosis   . IBS (irritable bowel syndrome)   . Internal hemorrhoids   . MVP (mitral valve prolapse)   . Osteoarthritis   . Rosacea   . Thoracic compression fracture ([place]) 06/29/15   T2-T6    Past Surgical History:  Procedure Laterality Date  . ABDOMINAL HYSTERECTOMY     partial  . ABDOMINAL HYSTERECTOMY    . ANAL FISSURE REPAIR     Dr Rise [name] 2009  . APPENDECTOMY    . COLPOSCOPY    . HEMORRHOID SURGERY     Dr. Rise [name] 2009  . OVARIAN CYST REMOVAL    . RT BUNIONECTOMT    . TONSILLECTOMY AND ADENOIDECTOMY    . TUBAL LIGATION    . UTERINE FIBROID SURGERY      There were no vitals filed for this visit.      Subjective Assessment - 04/27/16 0935    Subjective Felt very good after last session. Manual seemed to help alot.    Pertinent History fibromyalgia;  had previous PT neck and shoulder   Patient Stated Goals reduce pain, turn neck instead of  body   Currently in Pain? Yes   Pain Score 3    Pain Location Neck   Pain Orientation Right   Pain Descriptors / Indicators Aching;Dull   Pain Type Chronic pain   Pain Onset More than a month ago   Pain Frequency Constant   Aggravating Factors  Turning head   Pain Relieving Factors traction                         [facility] Adult PT Treatment/Exercise - 04/27/16 0001      Neck Exercises: Supine   Other Supine Exercise lay on foam roll, decompress 2 min; alterante shoulder flexion 10x; squeeze shoulder blades 5 sec 10x; retract head into roll 5 sec 10x;      Modalities   Modalities Electrical Stimulation;Moist Heat     Electrical Stimulation   Electrical Stimulation Location Bil upper traps   Electrical Stimulation Action IFC   Electrical Stimulation Parameters To patient tolerance   Electrical Stimulation Goals Pain     Manual Therapy   Manual Therapy Soft tissue mobilization;Myofascial release   Manual therapy comments Pt supine   Soft tissue mobilization Suboccipitals, SCM, and cervical muscles;  scalenes; lateral pterygoid muscle; platysimus; masseter   Myofascial Release Suboccipitals                  PT Short Term Goals - 04/27/16 [id number]      PT SHORT TERM GOAL #1   Title independent with initial HEP   Time 4   Period Weeks   Status Achieved     PT SHORT TERM GOAL #2   Title headaches decreased in frequency by 25%   Time 4   Period Weeks   Status On-going     PT SHORT TERM GOAL #3   Title ability to turn her head with pain decreased >/= 25% due to increase tissue mobility   Time 4   Period Weeks   Status On-going           PT Long Term Goals - 04/27/16 [id number]      PT LONG TERM GOAL #1   Title independent with HEP and understand how to progress herself   Time 12   Period Weeks   Status On-going     PT LONG TERM GOAL #2   Title ability to look upward into a cabinet with >/= 50% greater ease due to increase mobility and  decreased pain.   Time 12   Period Weeks   Status On-going     PT LONG TERM GOAL #4   Title ability to vacuum, clean dishes and cook with >/= 50% greater ease due to increased strength and decreased pain   Time 12   Period Weeks   Status On-going     PT LONG TERM GOAL #5   Title FOTO score </= 45% limitation   Time 12   Period Weeks   Status On-going               Plan - 04/27/16 1008    Clinical Impression Statement Pt responded well to manual therapy at last session. Able to tolerate all exercisese well with no increase in pain. Pt continues to have tightness and headache symptoms but is slowly progressing. Pt will continue to benefit from skilled thearpy for core and cervical stability and strength.   Rehab Potential Good   Clinical Impairments Affecting Rehab Potential Fell on 06/29/2015 resulting with fractured skull, fractured T7, fractured shoulder, had 12 weeks of [facility] Physical therapy   PT Frequency 3x / week   PT Duration 12 weeks   PT Treatment/Interventions Cryotherapy;Electrical Stimulation;Ultrasound;Traction;Moist Heat;Therapeutic activities;Therapeutic exercise;Neuromuscular re-education;Patient/family education;Passive range of motion;Manual techniques;Dry needling;Energy conservation;Taping   PT Next Visit Plan Dry needling, low level postural strengthening; soft tissue work around the cervical; jaw, scalenes; anterior cervical;   Consulted and Agree with Plan of Care Patient      Patient will benefit from skilled therapeutic intervention in order to improve the following deficits and impairments:  Decreased range of motion, Increased fascial restricitons, Decreased endurance, Increased muscle spasms, Pain, Hypomobility, Impaired flexibility, Decreased mobility, Decreased strength  Visit Diagnosis: Pain in thoracic spine  Other muscle spasm  Muscle weakness (generalized)  Other headache syndrome     Problem List Patient Active Problem List    Diagnosis Date Noted  . Aphasia 03/13/2016  . Trigger point of left shoulder region 12/11/2015  . Fibromyalgia 12/11/2015  . Post concussion syndrome 11/01/2015  . Clavicle fracture 10/30/2015  . Hair loss 10/11/2015  . Pain in joint, shoulder region 09/11/2015  . Right wrist pain 08/07/2015  . Splinter in skin 07/30/2015  . Fall from ladder 07/30/2015  .  Skull fracture with concussion ([place]) 07/30/2015  . Fracture of cervical vertebra, C6 ([place]) 07/30/2015  . Thoracic vertebral fracture ([place]) 07/30/2015  . Abdominal pain, epigastric 05/07/2015  . Scaly patch rash 02/26/2015  . Mitral valve regurgitation 09/27/2014  . PAT (paroxysmal atrial tachycardia) ([place]) 09/06/2014  . Tachycardia 07/31/2014  . Sprain of ankle 06/29/2014  . Earache, left 05/15/2013  . Dizziness 05/15/2013  . Asthmatic bronchitis 05/08/2013  . Benign paroxysmal positional vertigo 05/08/2013  . Chest wall pain 01/26/2013  . LUQ abdominal pain 01/20/2013  . Insomnia 01/10/2013  . Depression (emotion) 01/10/2013  . URI, acute 08/25/2012  . Snoring 08/25/2012  . IBS (irritable bowel syndrome) 02/08/2012  . Multiple pulmonary nodules 02/08/2012  . Chest heaviness 11/16/2010  . GERD 05/24/2010  . SINUSITIS, MAXILLARY, CHRONIC 05/20/2010  . Myalgia and myositis 11/20/2009  . Fatigue 11/20/2009  . ARTHRALGIA 06/26/2009  . Cervicogenic headache 06/26/2009  . TOBACCO USE, QUIT 06/26/2009  . LOW BACK PAIN 02/20/2009  . Rosacea 12/27/2008  . PRURITUS 12/27/2008  . Allergic urticaria 12/27/2008  . B12 deficiency 01/02/2008    Mikle Bosworth PTA 04/27/2016, 10:26 AM  [facility] [address], [place], [place], [address] Phone: [phone]   Fax:  [phone]  Name: Regina [name] MRN: [id number] Date of Birth: 05/07/1965

## 2016-04-28 ENCOUNTER — Ambulatory Visit: Payer: BC Managed Care – PPO | Admitting: *Deleted

## 2016-04-28 ENCOUNTER — Telehealth: Payer: Self-pay | Admitting: Obstetrics and Gynecology

## 2016-04-28 NOTE — Telephone Encounter (Signed)
Patient called today at 11:30 to cancel appointment for colposcopy scheduled for tomorrow, 04/29/16.  In speaking with the patient to re-schedule, I advised that since this cancellation is less than 48 hours, the cancellation policy, with fee will be applied. Patient states "never mind, don't cancel, I will be there, it will be awkward, but I will be there."  I asked patient what she meant by awkward, she advised "I will be there with my 3 grandkids, but I will be there, I'm not paying a cancellation fee."  I explained to the patient that if she brings her grandchildren, we will not be able to provide care for 3 her grandchildren, while she is having her appointment/procedure.  Patient states, "that's fine, they will just have to be in the room with me."  I placed call on hold, with patients permission and discussed with our nurse supervisor and practice administrator.  Both were in agreement, that we will waived the cancellation policy and reschedule.  This information was conveyed to the patient. She was very Patent attorney.  Patient has been rescheduled for 05/14/16. Earlier dates were offered, but the patient declined.  Forwarding to provider for final review.  Routing to Dr Talbert Nan  cc: Thayer Ohm

## 2016-04-29 ENCOUNTER — Ambulatory Visit: Payer: BC Managed Care – PPO | Admitting: Obstetrics and Gynecology

## 2016-04-30 ENCOUNTER — Ambulatory Visit: Payer: BC Managed Care – PPO | Attending: Internal Medicine | Admitting: Speech Pathology

## 2016-04-30 ENCOUNTER — Encounter: Payer: BC Managed Care – PPO | Admitting: Speech Pathology

## 2016-04-30 DIAGNOSIS — R41841 Cognitive communication deficit: Secondary | ICD-10-CM | POA: Diagnosis present

## 2016-04-30 NOTE — Therapy (Signed)
[facility] [address] [place], [place], [address] Phone: [phone]   Fax:  [phone]  Speech Language Pathology Treatment  Patient Details  Name: Regina [name] MRN: [id number] Date of Birth: 09-28-1965 Referring Provider: Dr. Alger Simons  Encounter Date: 04/30/2016      End of Session - 04/30/16 1415    Visit Number 5   Number of Visits 13   Date for SLP Re-Evaluation 05/19/16   SLP Start Time 1104   SLP Stop Time  1147   SLP Time Calculation (min) 43 min      Past Medical History:  Diagnosis Date  . Allergic urticaria   . ARTHRALGIA   . Arthritis   . Atrial fibrillation ([place])   . B12 DEFICIENCY   . CFS (chronic fatigue syndrome)   . Endometriosis   . Epidural hemorrhage with loss of consciousness ([place]) 06/29/15  . Fibroid   . Fibromyalgia   . Fracture of cervical vertebra, C6 ([place]) 06/29/15  . Gastritis   . GERD   . Heart murmur   . History of endometriosis   . IBS (irritable bowel syndrome)   . Internal hemorrhoids   . MVP (mitral valve prolapse)   . Osteoarthritis   . Rosacea   . Thoracic compression fracture ([place]) 06/29/15   T2-T6    Past Surgical History:  Procedure Laterality Date  . ABDOMINAL HYSTERECTOMY     partial  . ABDOMINAL HYSTERECTOMY    . ANAL FISSURE REPAIR     Dr Rise [name] 2009  . APPENDECTOMY    . COLPOSCOPY    . HEMORRHOID SURGERY     Dr. Rise [name] 2009  . OVARIAN CYST REMOVAL    . RT BUNIONECTOMT    . TONSILLECTOMY AND ADENOIDECTOMY    . TUBAL LIGATION    . UTERINE FIBROID SURGERY      There were no vitals filed for this visit.      Subjective Assessment - 04/30/16 1115    Subjective "See - I still have trouble finding words"               ADULT SLP TREATMENT - 04/30/16 1115      General Information   Behavior/Cognition Alert;Cooperative;Pleasant mood     Treatment Provided   Treatment provided Cognitive-Linquistic     Pain Assessment   Pain Assessment 0-10   Pain Score 2    Pain Location neck and "cape"   Pain Descriptors / Indicators Sore   Pain Intervention(s) Monitored during session     Cognitive-Linquistic Treatment   Treatment focused on Cognition;Patient/family/caregiver education   Skilled Treatment Pt reports "doing a little better" with remembering. Moderately complex math reasoning tasks with occasional mod A . Divided attention between complex 3 pile card sort and conversation with 90% on sort and rare min A to attend to all piles during conversation.     Assessment / Recommendations / Plan   Plan Continue with current plan of care     Progression Toward Goals   Progression toward goals Progressing toward goals              SLP Long Term Goals - 04/30/16 1152      SLP LONG TERM GOAL #1   Title Pt will utilize external aids to manage schedule, lists, recurring and non recurring tasks over 3 sessions with mod I   Baseline 1/25, 2/1,   Time 3   Period Weeks   Status On-going  SLP LONG TERM GOAL #2   Title Pt will divide attention between 2 complex cognitive linguistic tasks with 95% on each and supervision cues.    Time 3   Period Weeks   Status On-going     SLP LONG TERM GOAL #3   Title Pt will complete complex abstract naming tasks with 95% accuracy and mod I   Time 3   Period Weeks   Status On-going     SLP LONG TERM GOAL #4   Title Pt will utilize compensations for word finding episodes with mod I during complex conversations over 2 sessions.   Baseline no observed issues with word finding 1/25   Time 3   Period Weeks   Status On-going          Plan - 04/30/16 1415    Clinical Impression Statement Pt and I note some improvement in processing speed and ability to multitask, however higher level cognitive tasks continue to require more time than prior to pt's fall. Pt continues to verbalize frustration and anxiety when she encounters difficulty on cognitive tasks in ST.  Continue skilled ST to maximize cognition for possible return to work      Patient will benefit from skilled therapeutic intervention in order to improve the following deficits and impairments:   Cognitive communication deficit    Problem List Patient Active Problem List   Diagnosis Date Noted  . Aphasia 03/13/2016  . Trigger point of left shoulder region 12/11/2015  . Fibromyalgia 12/11/2015  . Post concussion syndrome 11/01/2015  . Clavicle fracture 10/30/2015  . Hair loss 10/11/2015  . Pain in joint, shoulder region 09/11/2015  . Right wrist pain 08/07/2015  . Splinter in skin 07/30/2015  . Fall from ladder 07/30/2015  . Skull fracture with concussion ([place]) 07/30/2015  . Fracture of cervical vertebra, C6 ([place]) 07/30/2015  . Thoracic vertebral fracture ([place]) 07/30/2015  . Abdominal pain, epigastric 05/07/2015  . Scaly patch rash 02/26/2015  . Mitral valve regurgitation 09/27/2014  . PAT (paroxysmal atrial tachycardia) ([place]) 09/06/2014  . Tachycardia 07/31/2014  . Sprain of ankle 06/29/2014  . Earache, left 05/15/2013  . Dizziness 05/15/2013  . Asthmatic bronchitis 05/08/2013  . Benign paroxysmal positional vertigo 05/08/2013  . Chest wall pain 01/26/2013  . LUQ abdominal pain 01/20/2013  . Insomnia 01/10/2013  . Depression (emotion) 01/10/2013  . URI, acute 08/25/2012  . Snoring 08/25/2012  . IBS (irritable bowel syndrome) 02/08/2012  . Multiple pulmonary nodules 02/08/2012  . Chest heaviness 11/16/2010  . GERD 05/24/2010  . SINUSITIS, MAXILLARY, CHRONIC 05/20/2010  . Myalgia and myositis 11/20/2009  . Fatigue 11/20/2009  . ARTHRALGIA 06/26/2009  . Cervicogenic headache 06/26/2009  . TOBACCO USE, QUIT 06/26/2009  . LOW BACK PAIN 02/20/2009  . Rosacea 12/27/2008  . PRURITUS 12/27/2008  . Allergic urticaria 12/27/2008  . B12 deficiency 01/02/2008    Lovvorn, Annye Rusk  MS, CCC-SLP 04/30/2016, 2:16 PM  [facility] [address] [place] [place], [place], [address] Phone: [phone]   Fax:  [phone]   Name: KAVEAH CARRUTH MRN: [id number] Date of Birth: 1966/02/26

## 2016-05-01 ENCOUNTER — Ambulatory Visit: Payer: BC Managed Care – PPO | Attending: Internal Medicine | Admitting: Physical Therapy

## 2016-05-01 DIAGNOSIS — G4489 Other headache syndrome: Secondary | ICD-10-CM | POA: Insufficient documentation

## 2016-05-01 DIAGNOSIS — M6281 Muscle weakness (generalized): Secondary | ICD-10-CM | POA: Insufficient documentation

## 2016-05-01 DIAGNOSIS — M546 Pain in thoracic spine: Secondary | ICD-10-CM | POA: Insufficient documentation

## 2016-05-01 DIAGNOSIS — M62838 Other muscle spasm: Secondary | ICD-10-CM | POA: Insufficient documentation

## 2016-05-01 DIAGNOSIS — R41841 Cognitive communication deficit: Secondary | ICD-10-CM | POA: Insufficient documentation

## 2016-05-04 ENCOUNTER — Ambulatory Visit: Payer: BC Managed Care – PPO | Admitting: Physical Therapy

## 2016-05-04 ENCOUNTER — Encounter: Payer: Self-pay | Admitting: Physical Therapy

## 2016-05-04 DIAGNOSIS — M62838 Other muscle spasm: Secondary | ICD-10-CM | POA: Diagnosis present

## 2016-05-04 DIAGNOSIS — G4489 Other headache syndrome: Secondary | ICD-10-CM | POA: Diagnosis present

## 2016-05-04 DIAGNOSIS — M6281 Muscle weakness (generalized): Secondary | ICD-10-CM

## 2016-05-04 DIAGNOSIS — M546 Pain in thoracic spine: Secondary | ICD-10-CM

## 2016-05-04 DIAGNOSIS — R41841 Cognitive communication deficit: Secondary | ICD-10-CM | POA: Diagnosis present

## 2016-05-04 NOTE — Therapy (Addendum)
[facility] [address], [place], [place], [address] Phone: [phone]   Fax:  [phone]  Physical Therapy Treatment  Patient Details  Name: Regina [name] MRN: [id number] Date of Birth: [birth year] Referring Provider: Dr. Lew Dawes  Encounter Date: 05/04/2016      PT End of Session - 05/04/16 1025    Visit Number 9   Date for PT Re-Evaluation 06/24/16   PT Start Time 0937   PT Stop Time 1031   PT Time Calculation (min) 54 min   Activity Tolerance Patient tolerated treatment well   Behavior During Therapy [facility] for tasks assessed/performed      Past Medical History:  Diagnosis Date  . Allergic urticaria   . ARTHRALGIA   . Arthritis   . Atrial fibrillation ([place])   . B12 DEFICIENCY   . CFS (chronic fatigue syndrome)   . Endometriosis   . Epidural hemorrhage with loss of consciousness ([place]) 06/29/15  . Fibroid   . Fibromyalgia   . Fracture of cervical vertebra, C6 ([place]) 06/29/15  . Gastritis   . GERD   . Heart murmur   . History of endometriosis   . IBS (irritable bowel syndrome)   . Internal hemorrhoids   . MVP (mitral valve prolapse)   . Osteoarthritis   . Rosacea   . Thoracic compression fracture ([place]) 06/29/15   T2-T6    Past Surgical History:  Procedure Laterality Date  . ABDOMINAL HYSTERECTOMY     partial  . ABDOMINAL HYSTERECTOMY    . ANAL FISSURE REPAIR     Dr Rise [name] 2009  . APPENDECTOMY    . COLPOSCOPY    . HEMORRHOID SURGERY     Dr. Rise [name] 2009  . OVARIAN CYST REMOVAL    . RT BUNIONECTOMT    . TONSILLECTOMY AND ADENOIDECTOMY    . TUBAL LIGATION    . UTERINE FIBROID SURGERY      There were no vitals filed for this visit.      Subjective Assessment - 05/04/16 0943    Subjective Felt pretty good after last session.    Pertinent History fibromyalgia;  had previous PT neck and shoulder   Patient Stated Goals reduce pain, turn neck instead of body   Currently in Pain?  Yes   Pain Score 3    Pain Location Neck   Pain Orientation Right   Pain Descriptors / Indicators Aching;Dull   Pain Type Chronic pain   Pain Onset More than a month ago   Pain Frequency Constant                         [facility] Adult PT Treatment/Exercise - 05/04/16 0001      Neck Exercises: Supine   Other Supine Exercise Horizontal abduction  on foam roller yellow x10   Other Supine Exercise lay on foam roll, decompress 2 min; alterante shoulder flexion 10x; squeeze shoulder blades 5 sec 10x; retract head into roll 5 sec 10x;      Modalities   Modalities Electrical Stimulation;Moist Heat     Moist Heat Therapy   Number Minutes Moist Heat 15 Minutes   Moist Heat Location Cervical;Lumbar Spine     Electrical Stimulation   Electrical Stimulation Location Bil upper traps   Electrical Stimulation Action IFC   Electrical Stimulation Parameters To patient tolerance   Electrical Stimulation Goals Pain     Manual Therapy   Manual Therapy Soft tissue mobilization;Myofascial release  Manual therapy comments Pt prone   Soft tissue mobilization Suboccipitals, SCM, and cervical muscles; scalenes; lateral pterygoid muscle; platysimus; masseter     Pt was also given ice pack to Rt knee for 10 minutes to decrease pain from fall.  Mikle Bosworth, PTA 05/04/16 3:19 PM             PT Short Term Goals - 05/04/16 1019      PT SHORT TERM GOAL #2   Title headaches decreased in frequency by 25%   Time 4   Period Weeks   Status On-going     PT SHORT TERM GOAL #3   Title ability to turn her head with pain decreased >/= 25% due to increase tissue mobility   Time 4   Period Weeks   Status On-going           PT Long Term Goals - 05/04/16 1019      PT LONG TERM GOAL #1   Title independent with HEP and understand how to progress herself   Time 12   Period Weeks   Status On-going     PT LONG TERM GOAL #2   Title ability to look upward into a cabinet  with >/= 50% greater ease due to increase mobility and decreased pain.   Time 12   Period Weeks   Status On-going               Plan - 05/04/16 0945    Clinical Impression Statement At start of therapy pt reports she fell while coming into therapy, tripped over curb. Pt fell on right knee and Right wrist. Rt knee with cut. Pt given alchohol swabs and bandaids for knee.  Continues to have limted tolerance for exercises. Responds well with manual massage and release techniquees. Pt will continue to benefit from skilled therapy for upper back and postural strengthening.    Rehab Potential Good   Clinical Impairments Affecting Rehab Potential Fell on 06/29/2015 resulting with fractured skull, fractured T7, fractured shoulder, had 12 weeks of [facility]   PT Frequency 3x / week   PT Duration 12 weeks   PT Treatment/Interventions Cryotherapy;Electrical Stimulation;Ultrasound;Traction;Moist Heat;Therapeutic activities;Therapeutic exercise;Neuromuscular re-education;Patient/family education;Passive range of motion;Manual techniques;Dry needling;Energy conservation;Taping   PT Next Visit Plan Low level postural strengthening; soft tissue work around the cervical; jaw, scalenes; anterior cervical;   Consulted and Agree with Plan of Care Patient      Patient will benefit from skilled therapeutic intervention in order to improve the following deficits and impairments:  Decreased range of motion, Increased fascial restricitons, Decreased endurance, Increased muscle spasms, Pain, Hypomobility, Impaired flexibility, Decreased mobility, Decreased strength  Visit Diagnosis: Pain in thoracic spine  Other muscle spasm  Muscle weakness (generalized)  Other headache syndrome     Problem List Patient Active Problem List   Diagnosis Date Noted  . Aphasia 03/13/2016  . Trigger point of left shoulder region 12/11/2015  . Fibromyalgia 12/11/2015  . Post concussion syndrome 11/01/2015   . Clavicle fracture 10/30/2015  . Hair loss 10/11/2015  . Pain in joint, shoulder region 09/11/2015  . Right wrist pain 08/07/2015  . Splinter in skin 07/30/2015  . Fall from ladder 07/30/2015  . Skull fracture with concussion ([place]) 07/30/2015  . Fracture of cervical vertebra, C6 ([place]) 07/30/2015  . Thoracic vertebral fracture ([place]) 07/30/2015  . Abdominal pain, epigastric 05/07/2015  . Scaly patch rash 02/26/2015  . Mitral valve regurgitation 09/27/2014  . PAT (paroxysmal atrial tachycardia) ([place]) 09/06/2014  .  Tachycardia 07/31/2014  . Sprain of ankle 06/29/2014  . Earache, left 05/15/2013  . Dizziness 05/15/2013  . Asthmatic bronchitis 05/08/2013  . Benign paroxysmal positional vertigo 05/08/2013  . Chest wall pain 01/26/2013  . LUQ abdominal pain 01/20/2013  . Insomnia 01/10/2013  . Depression (emotion) 01/10/2013  . URI, acute 08/25/2012  . Snoring 08/25/2012  . IBS (irritable bowel syndrome) 02/08/2012  . Multiple pulmonary nodules 02/08/2012  . Chest heaviness 11/16/2010  . GERD 05/24/2010  . SINUSITIS, MAXILLARY, CHRONIC 05/20/2010  . Myalgia and myositis 11/20/2009  . Fatigue 11/20/2009  . ARTHRALGIA 06/26/2009  . Cervicogenic headache 06/26/2009  . TOBACCO USE, QUIT 06/26/2009  . LOW BACK PAIN 02/20/2009  . Rosacea 12/27/2008  . PRURITUS 12/27/2008  . Allergic urticaria 12/27/2008  . B12 deficiency 01/02/2008    Mikle Bosworth PTA 05/04/2016, 10:36 AM  [facility] [address] [place], [place], [address] Phone: [phone]   Fax:  [phone]  Name: ARLEEN DICARLO MRN: [id number] Date of Birth: 04/29/1965

## 2016-05-05 ENCOUNTER — Telehealth: Payer: Self-pay | Admitting: Internal Medicine

## 2016-05-05 ENCOUNTER — Encounter: Payer: Self-pay | Admitting: *Deleted

## 2016-05-05 ENCOUNTER — Ambulatory Visit: Payer: BC Managed Care – PPO | Admitting: *Deleted

## 2016-05-05 ENCOUNTER — Encounter: Payer: Self-pay | Admitting: Internal Medicine

## 2016-05-05 ENCOUNTER — Ambulatory Visit (INDEPENDENT_AMBULATORY_CARE_PROVIDER_SITE_OTHER): Payer: BC Managed Care – PPO | Admitting: Internal Medicine

## 2016-05-05 DIAGNOSIS — E538 Deficiency of other specified B group vitamins: Secondary | ICD-10-CM

## 2016-05-05 DIAGNOSIS — S8000XA Contusion of unspecified knee, initial encounter: Secondary | ICD-10-CM | POA: Insufficient documentation

## 2016-05-05 DIAGNOSIS — W101XXA Fall (on)(from) sidewalk curb, initial encounter: Secondary | ICD-10-CM | POA: Diagnosis not present

## 2016-05-05 DIAGNOSIS — R42 Dizziness and giddiness: Secondary | ICD-10-CM | POA: Diagnosis not present

## 2016-05-05 DIAGNOSIS — R41841 Cognitive communication deficit: Secondary | ICD-10-CM | POA: Diagnosis not present

## 2016-05-05 DIAGNOSIS — S8001XA Contusion of right knee, initial encounter: Secondary | ICD-10-CM

## 2016-05-05 MED ORDER — CYANOCOBALAMIN 1000 MCG/ML IJ SOLN
1000.0000 ug | Freq: Once | INTRAMUSCULAR | Status: AC
  Administered 2016-05-05: 1000 ug via INTRAMUSCULAR

## 2016-05-05 NOTE — Assessment & Plan Note (Signed)
On B12 

## 2016-05-05 NOTE — Assessment & Plan Note (Signed)
Post concussion - discussed

## 2016-05-05 NOTE — Progress Notes (Signed)
Subjective:  Patient ID: Regina [name], female    DOB: 04-16-1965  Age: 51 y.o. MRN: [id number]  CC: No chief complaint on file.   HPI MACYE WAGENBLAST presents for R knee pain Pt tripped over the curb and fell - hit R knee the hardest, hands and lips. No LOC  Outpatient Medications Prior to Visit  Medication Sig Dispense Refill  . Acetaminophen-Codeine (TYLENOL/CODEINE #3) 300-30 MG tablet Take 0.5-1 tablets by mouth every 4 (four) hours as needed for pain. 60 tablet 1  . amitriptyline (ELAVIL) 10 MG tablet Take 1 tablet (10 mg total) by mouth at bedtime. 30 tablet 5  . CASCARA SAGRADA PO Take by mouth.    . Cholecalciferol (GNP VITAMIN D) 1000 units tablet Take 2 tablets (2,000 Units total) by mouth daily. 100 tablet 3  . cyanocobalamin (,VITAMIN B-12,) 1000 MCG/ML injection Inject 1 mL (1,000 mcg total) into the skin every 14 (fourteen) days. 10 mL 11  . ergocalciferol (VITAMIN D2) 50000 units capsule Take 1 capsule (50,000 Units total) by mouth once a week. 6 capsule 0  . fluocinonide (LIDEX) 0.05 % external solution     . gabapentin (NEURONTIN) 100 MG capsule     . hydrocortisone 2.5 % cream Apply topically 2 (two) times daily. 40 g 1  . Phosphatidylserine-DHA-EPA (VAYACOG) 100-19.5-6.5 MG CAPS Take 1 capsule by mouth daily. 30 capsule 11  . SYRINGE-NEEDLE, DISP, 3 ML (BD ECLIPSE SYRINGE) 25G X 1" 3 ML MISC Use IM 50 each 11  . Turmeric 500 MG TABS Take by mouth 2 (two) times daily.    Marland Kitchen venlafaxine XR (EFFEXOR XR) 37.5 MG 24 hr capsule Take 1 capsule (37.5 mg total) by mouth daily with breakfast. 30 capsule 1  . vitamin B-12 (CYANOCOBALAMIN) 1000 MCG tablet Take 1,000 mcg by mouth daily.     No facility-administered medications prior to visit.     ROS Review of Systems  Constitutional: Negative for activity change, appetite change, chills, fatigue and unexpected weight change.  HENT: Negative for congestion, mouth sores and sinus pressure.   Eyes: Negative for visual  disturbance.  Respiratory: Negative for cough and chest tightness.   Gastrointestinal: Negative for abdominal pain and nausea.  Genitourinary: Negative for difficulty urinating, frequency and vaginal pain.  Musculoskeletal: Positive for arthralgias. Negative for back pain and gait problem.  Skin: Negative for pallor and rash.  Neurological: Negative for dizziness, tremors, weakness, numbness and headaches.  Psychiatric/Behavioral: Positive for decreased concentration. Negative for confusion and sleep disturbance.    Objective:  BP 128/80   Pulse 67   Resp 20   Wt 171 lb 4 oz (77.7 kg)   SpO2 98%   BMI 29.39 kg/m   BP Readings from Last 3 Encounters:  05/05/16 128/80  04/20/16 100/64  03/31/16 102/60    Wt Readings from Last 3 Encounters:  05/05/16 171 lb 4 oz (77.7 kg)  04/20/16 169 lb 6 oz (76.8 kg)  03/31/16 169 lb (76.7 kg)    Physical Exam  Constitutional: She appears well-developed. No distress.  HENT:  Head: Normocephalic.  Right Ear: External ear normal.  Left Ear: External ear normal.  Nose: Nose normal.  Mouth/Throat: Oropharynx is clear and moist.  Eyes: Conjunctivae are normal. Pupils are equal, round, and reactive to light. Right eye exhibits no discharge. Left eye exhibits no discharge.  Neck: Normal range of motion. Neck supple. No JVD present. No tracheal deviation present. No thyromegaly present.  Cardiovascular: Normal rate, regular rhythm  and normal heart sounds.   Pulmonary/Chest: No stridor. No respiratory distress. She has no wheezes.  Abdominal: Soft. Bowel sounds are normal. She exhibits no distension and no mass. There is no tenderness. There is no rebound and no guarding.  Musculoskeletal: She exhibits tenderness. She exhibits no edema.  Lymphadenopathy:    She has no cervical adenopathy.  Neurological: She displays normal reflexes. No cranial nerve deficit. She exhibits normal muscle tone. Coordination normal.  Skin: No rash noted. There is  erythema.  Psychiatric: She has a normal mood and affect. Her behavior is normal. Judgment and thought content normal.  R knee w/abrasion; tender L>R shoulder is tender w/ROM  Lab Results  Component Value Date   WBC 7.2 09/11/2015   HGB 13.4 09/11/2015   HCT 39.4 09/11/2015   PLT 385.0 09/11/2015   GLUCOSE 106 (H) 09/11/2015   CHOL 201 (H) 02/26/2015   TRIG 65.0 02/26/2015   HDL 62.10 02/26/2015   LDLCALC 126 (H) 02/26/2015   ALT 18 09/11/2015   AST 19 09/11/2015   NA 138 09/11/2015   K 3.5 09/11/2015   CL 101 09/11/2015   CREATININE 0.62 09/11/2015   BUN 11 09/11/2015   CO2 29 09/11/2015   TSH 1.19 09/11/2015   INR 1.16 06/29/2015    Mr Wrist Right W Contrast  Result Date: 12/06/2015 CLINICAL DATA:  Right wrist pain status post fall. Numbness and weakness. Limited range of motion. EXAM: MRI OF THE RIGHT WRIST WITH CONTRAST(MR Arthrogram) TECHNIQUE: Multiplanar, multisequence MR imaging of the wrist was performed immediately following contrast injection into the radiocarpal joint under fluoroscopic guidance. No intravenous contrast was administered. COMPARISON:  None. FINDINGS: Ligaments: Intact scapholunate and lunotriquetral ligaments. Triangular fibrocartilage: Intact. Tendons: Intact flexor and extensor compartment tendons. Carpal tunnel/median nerve: Normal. Guyon's canal: Normal. Joint/cartilage: Intraarticular contrast in the radiocarpal joint distending the joint capsule. No chondral defect. No intra-articular loose body. Bones/carpal alignment: No marrow signal abnormality. No fracture or dislocation. Normal alignment. Other: No fluid collection or hematoma. IMPRESSION: 1. No internal derangement of the right wrist. Electronically Signed   By: Kathreen Devoid   On: 12/06/2015 17:08   Dg Fluoro Guided Needle Plc Aspiration/injection Loc  Result Date: 12/06/2015 CLINICAL DATA:  Right wrist pain.  20 foot fall 5 months ago. FLUOROSCOPY TIME:  Radiation Exposure Index (as provided  by the fluoroscopic device): 0.93 uGy*m2 Fluoroscopy Time:  18 seconds Number of Acquired Images:  0 PROCEDURE: Right WRIST INJECTION UNDER FLUOROSCOPY An appropriate skin entrance site was determined. The site was marked, prepped with Betadine, draped in the usual sterile fashion, and infiltrated locally with 1% Lidocaine. A 25 gauge skin needle was advanced into the radiocarpal joint under intermittent fluoroscopy. 0.1 mL MultiHance was diluted and 20 mL of Isovue M 200. This was then used to opacify the proximal carpal joint. No immediate complication. IMPRESSION: Technically successful right wrist injection for MRI. Electronically Signed   By: San Morelle M.D.   On: 12/06/2015 16:18    Assessment & Plan:   There are no diagnoses linked to this encounter. I am having Ms. Vitiello maintain her SYRINGE-NEEDLE (DISP) 3 ML, cyanocobalamin, CASCARA SAGRADA PO, Phosphatidylserine-DHA-EPA, Acetaminophen-Codeine, Cholecalciferol, vitamin B-12, Turmeric, hydrocortisone, ergocalciferol, venlafaxine XR, fluocinonide, gabapentin, and amitriptyline.  No orders of the defined types were placed in this encounter.    Follow-up: No Follow-up on file.  Walker Kehr, MD

## 2016-05-05 NOTE — Assessment & Plan Note (Addendum)
05/04/16 R knee  Ice X ray if needed

## 2016-05-05 NOTE — Assessment & Plan Note (Signed)
Contusions/abrasions - discussed

## 2016-05-05 NOTE — Telephone Encounter (Signed)
Notified pt w/MD response. Made appt for 4:30...[name]

## 2016-05-05 NOTE — Progress Notes (Signed)
Pre visit review using our clinic review tool, if applicable. No additional management support is needed unless otherwise documented below in the visit note. 

## 2016-05-05 NOTE — Telephone Encounter (Signed)
Patient Name: KLANI BACHNER  DOB: [birth year]    Initial Comment Caller states, she took a fall on the sidewalk - sore on neck and back and injury to her knee. She had vertigo and slurred speech for a few hours afterwards. Verified    Nurse Assessment  Nurse: Leilani Merl, RN, Heather Date/Time (Eastern Time): 05/05/2016 10:09:20 AM  Confirm and document reason for call. If symptomatic, describe symptoms. ---Caller states, she took a fall on the sidewalk yesterday- sore on neck and back and injury to her knee with left shoulder pain. She had vertigo and slurred speech for a few hours afterwards  Does the patient have any new or worsening symptoms? ---Yes  Will a triage be completed? ---Yes  Related visit to physician within the last 2 weeks? ---No  Does the PT have any chronic conditions? (i.e. diabetes, asthma, etc.) ---Yes  List chronic conditions. ---See MR  Is the patient pregnant or possibly pregnant? (Ask all females between the ages of 82-55) ---No  Is this a behavioral health or substance abuse call? ---No     Guidelines    Guideline Title Affirmed Question Affirmed Notes  Neck Injury High-risk adult (e.g., rheumatoid arthritis, ankylosing spondylitis, cervical spine abnormalities)    Final Disposition User   See Physician within 4 Hours (or PCP triage) Standifer, RN, Nira Conn    Comments  caller triaged to be seen within 4 hours, she only wants to see Dr. Alain Marion, please see if she can be worked in and call and let her know.  Caller states that she has vertigo and slurred speech normally anyway from a previous head injury but it was worse yesterday soon after the fall and is normal today.   Referrals  REFERRED TO PCP OFFICE   Disagree/Comply: Comply

## 2016-05-05 NOTE — Patient Instructions (Signed)
You can try TRE - Trauma Release Exercise  You can get a "Stress Less TRE" application

## 2016-05-05 NOTE — Therapy (Signed)
[facility] [address] [place], [place], [address] Phone: [phone]   Fax:  [phone]  Speech Language Pathology Treatment  Patient Details  Name: Regina [name] MRN: [id number] Date of Birth: [birth year] Referring Provider: Dr. Alger Simons  Encounter Date: 05/05/2016      End of Session - 05/05/16 1013    Visit Number 6   Number of Visits 13   Date for SLP Re-Evaluation 05/19/16   SLP Start Time 0945   SLP Stop Time  1015   SLP Time Calculation (min) 30 min   Activity Tolerance Patient tolerated treatment well      Past Medical History:  Diagnosis Date  . Allergic urticaria   . ARTHRALGIA   . Arthritis   . Atrial fibrillation ([place])   . B12 DEFICIENCY   . CFS (chronic fatigue syndrome)   . Endometriosis   . Epidural hemorrhage with loss of consciousness ([place]) 06/29/15  . Fibroid   . Fibromyalgia   . Fracture of cervical vertebra, C6 ([place]) 06/29/15  . Gastritis   . GERD   . Heart murmur   . History of endometriosis   . IBS (irritable bowel syndrome)   . Internal hemorrhoids   . MVP (mitral valve prolapse)   . Osteoarthritis   . Rosacea   . Thoracic compression fracture ([place]) 06/29/15   T2-T6    Past Surgical History:  Procedure Laterality Date  . ABDOMINAL HYSTERECTOMY     partial  . ABDOMINAL HYSTERECTOMY    . ANAL FISSURE REPAIR     Dr Rise [name] 2009  . APPENDECTOMY    . COLPOSCOPY    . HEMORRHOID SURGERY     Dr. Rise [name] 2009  . OVARIAN CYST REMOVAL    . RT BUNIONECTOMT    . TONSILLECTOMY AND ADENOIDECTOMY    . TUBAL LIGATION    . UTERINE FIBROID SURGERY      There were no vitals filed for this visit.      Subjective Assessment - 05/05/16 1002    Subjective I took a fall yesterday going into physical therapy. My speech was slurred for several hours afterwards.    Currently in Pain? Yes   Pain Score 5    Pain Location Shoulder   Pain Descriptors / Indicators Aching   Pain  Type Acute pain   Pain Onset --  increased pain after fall yesterday               ADULT SLP TREATMENT - 05/05/16 0001      General Information   Behavior/Cognition Alert;Cooperative;Pleasant mood     Treatment Provided   Treatment provided Cognitive-Linquistic     Pain Assessment   Pain Assessment 0-10   Pain Score 5    Pain Location --  shoulder pain from fall yesterday   Pain Descriptors / Indicators Sore   Pain Intervention(s) Other (comment)  Pt called MD office during therapy     Cognitive-Linquistic Treatment   Treatment focused on Cognition;Aphasia;Patient/family/caregiver education   Skilled Treatment Pt arrived late to therapy, stating she was "discombobulated" due to fall yesterday. Pt reported increase in slurred speech for several hours after the fall. SLP encouraged pt to contact MD office and discuss with RN. Pt was able to divide attention between therapy task and waiting for RN to answer the phone. She reports missing an appointment last week due to changing it and not entering it immediately into her phone. She indicated this was the first time  this had happened in a long time, and knew what she needed to do to avoid this in the future. Pt reports word finding is improving, however, she "randomly" has difficulty finding specific words. She intermittently experiences apraxic errors (saying spurred sleech instead of slurred speech), and uses caution when talking to avoid this problem.     Assessment / Recommendations / Plan   Plan Continue with current plan of care     Progression Toward Goals   Progression toward goals Progressing toward goals          SLP Education - 05/05/16 1012    Education provided Yes   Education Details encouraged crossword puzzles for higher level word retrieval    Person(s) Educated Patient   Methods Explanation;Demonstration   Comprehension Verbalized understanding;Returned demonstration            SLP Long Term  Goals - 05/05/16 1202      SLP LONG TERM GOAL #1   Title Pt will utilize external aids to manage schedule, lists, recurring and non recurring tasks over 3 sessions with mod I   Baseline 1/25, 2/1, 2/6   Status Achieved     SLP LONG TERM GOAL #2   Title Pt will divide attention between 2 complex cognitive linguistic tasks with 95% on each and supervision cues.    Time 2   Period Weeks   Status On-going     SLP LONG TERM GOAL #3   Title Pt will complete complex abstract naming tasks with 95% accuracy and mod I   Time 2   Period Weeks   Status On-going     SLP LONG TERM GOAL #4   Title Pt will utilize compensations for word finding episodes with mod I during complex conversations over 2 sessions.   Baseline 1/25, 2/6   Status Achieved          Plan - 05/05/16 1013    Clinical Impression Statement Pt continues to demonstrate improvement in cog/com status. Of concern this session was that pt fell yesterday and experienced increase in slurred speech for several hours. Pt called MD office and will be worked in today. Continued ST intervention is recommended with focus on improving high level communication effectiveness and complex cognitive skills.   Speech Therapy Frequency 2x / week   Duration --  6 weeks or 13 visits   Treatment/Interventions Compensatory strategies;Patient/family education;Functional tasks;Cueing hierarchy;Cognitive reorganization;SLP instruction and feedback;Internal/external aids;Language facilitation;Environmental controls   Potential to Achieve Goals Good   Potential Considerations Ability to learn/carryover information;Family/community support;Previous level of function;Cooperation/participation level   Consulted and Agree with Plan of Care Patient      Patient will benefit from skilled therapeutic intervention in order to improve the following deficits and impairments:   Cognitive communication deficit    Problem List Patient Active Problem List    Diagnosis Date Noted  . Aphasia 03/13/2016  . Trigger point of left shoulder region 12/11/2015  . Fibromyalgia 12/11/2015  . Post concussion syndrome 11/01/2015  . Clavicle fracture 10/30/2015  . Hair loss 10/11/2015  . Pain in joint, shoulder region 09/11/2015  . Right wrist pain 08/07/2015  . Splinter in skin 07/30/2015  . Fall from ladder 07/30/2015  . Skull fracture with concussion ([place]) 07/30/2015  . Fracture of cervical vertebra, C6 ([place]) 07/30/2015  . Thoracic vertebral fracture ([place]) 07/30/2015  . Abdominal pain, epigastric 05/07/2015  . Scaly patch rash 02/26/2015  . Mitral valve regurgitation 09/27/2014  . PAT (paroxysmal atrial tachycardia) ([place]) 09/06/2014  .  Tachycardia 07/31/2014  . Sprain of ankle 06/29/2014  . Earache, left 05/15/2013  . Dizziness 05/15/2013  . Asthmatic bronchitis 05/08/2013  . Benign paroxysmal positional vertigo 05/08/2013  . Chest wall pain 01/26/2013  . LUQ abdominal pain 01/20/2013  . Insomnia 01/10/2013  . Depression (emotion) 01/10/2013  . URI, acute 08/25/2012  . Snoring 08/25/2012  . IBS (irritable bowel syndrome) 02/08/2012  . Multiple pulmonary nodules 02/08/2012  . Chest heaviness 11/16/2010  . GERD 05/24/2010  . SINUSITIS, MAXILLARY, CHRONIC 05/20/2010  . Myalgia and myositis 11/20/2009  . Fatigue 11/20/2009  . ARTHRALGIA 06/26/2009  . Cervicogenic headache 06/26/2009  . TOBACCO USE, QUIT 06/26/2009  . LOW BACK PAIN 02/20/2009  . Rosacea 12/27/2008  . PRURITUS 12/27/2008  . Allergic urticaria 12/27/2008  . B12 deficiency 01/02/2008   Celia B. [place], MSP, CCC-SLP  Shonna Chock 05/05/2016, 12:05 PM  [facility] [address] [place], [place], [address] Phone: [phone]   Fax:  [phone]   Name: COURTLYN HEARST MRN: [id number] Date of Birth: April 30, 1965

## 2016-05-05 NOTE — Telephone Encounter (Signed)
I could w/in at 4:30 today Thx

## 2016-05-06 ENCOUNTER — Ambulatory Visit: Payer: BC Managed Care – PPO

## 2016-05-06 DIAGNOSIS — M546 Pain in thoracic spine: Secondary | ICD-10-CM

## 2016-05-06 DIAGNOSIS — M6281 Muscle weakness (generalized): Secondary | ICD-10-CM

## 2016-05-06 DIAGNOSIS — M62838 Other muscle spasm: Secondary | ICD-10-CM

## 2016-05-06 NOTE — Therapy (Signed)
[facility] [address], [place], [place], [address] Phone: [phone]   Fax:  [phone]  Physical Therapy Treatment  Patient Details  Name: Regina [name] MRN: [id number] Date of Birth: 14-Sep-1965 Referring Provider: Dr. Lew Dawes  Encounter Date: 05/06/2016      PT End of Session - 05/06/16 1015    Visit Number 10   Date for PT Re-Evaluation 06/24/16   PT Start Time 0939  late and dry needling   PT Stop Time 1030   PT Time Calculation (min) 51 min   Activity Tolerance Patient tolerated treatment well   Behavior During Therapy [facility] for tasks assessed/performed      Past Medical History:  Diagnosis Date  . Allergic urticaria   . ARTHRALGIA   . Arthritis   . Atrial fibrillation ([place])   . B12 DEFICIENCY   . CFS (chronic fatigue syndrome)   . Endometriosis   . Epidural hemorrhage with loss of consciousness ([place]) 06/29/15  . Fibroid   . Fibromyalgia   . Fracture of cervical vertebra, C6 ([place]) 06/29/15  . Gastritis   . GERD   . Heart murmur   . History of endometriosis   . IBS (irritable bowel syndrome)   . Internal hemorrhoids   . MVP (mitral valve prolapse)   . Osteoarthritis   . Rosacea   . Thoracic compression fracture ([place]) 06/29/15   T2-T6    Past Surgical History:  Procedure Laterality Date  . ABDOMINAL HYSTERECTOMY     partial  . ABDOMINAL HYSTERECTOMY    . ANAL FISSURE REPAIR     Dr Rise [name] 2009  . APPENDECTOMY    . COLPOSCOPY    . HEMORRHOID SURGERY     Dr. Rise [name] 2009  . OVARIAN CYST REMOVAL    . RT BUNIONECTOMT    . TONSILLECTOMY AND ADENOIDECTOMY    . TUBAL LIGATION    . UTERINE FIBROID SURGERY      There were no vitals filed for this visit.      Subjective Assessment - 05/06/16 0942    Subjective My neck feels stiff today.     Patient Stated Goals reduce pain, turn neck instead of body   Currently in Pain? Yes   Pain Score 4    Pain Location Neck   Pain  Orientation Left   Pain Descriptors / Indicators Tightness                         [facility] Adult PT Treatment/Exercise - 05/06/16 0001      Neck Exercises: Machines for Strengthening   UBE (Upper Arm Bike) Level 0x  5 minutes   PT present to discuss progress     Modalities   Modalities Electrical Stimulation;Moist Heat     Moist Heat Therapy   Number Minutes Moist Heat 15 Minutes   Moist Heat Location Cervical     Electrical Stimulation   Electrical Stimulation Location Bil upper traps   Electrical Stimulation Action IFC   Electrical Stimulation Parameters 15 minutes   Electrical Stimulation Goals Pain     Manual Therapy   Manual Therapy Soft tissue mobilization;Myofascial release   Manual therapy comments pt supine   Soft tissue mobilization upper trap, cervical paraspinals, and subpoccipitals          Trigger Point Dry Needling - 05/06/16 0946    Consent Given? Yes   Muscles Treated Upper Body Upper trapezius;Suboccipitals muscle group  Upper Trapezius Response Twitch reponse elicited;Palpable increased muscle length   SubOccipitals Response Twitch response elicited;Palpable increased muscle length                PT Short Term Goals - 05/06/16 0943      PT SHORT TERM GOAL #2   Title headaches decreased in frequency by 25%   Time 4   Period Weeks   Status On-going           PT Long Term Goals - 05/04/16 1019      PT LONG TERM GOAL #1   Title independent with HEP and understand how to progress herself   Time 12   Period Weeks   Status On-going     PT LONG TERM GOAL #2   Title ability to look upward into a cabinet with >/= 50% greater ease due to increase mobility and decreased pain.   Time 12   Period Weeks   Status On-going               Plan - 05/06/16 [id number]    Clinical Impression Statement Pt with continued tension and trigger points in the nech and thoracic spine.   Pt with improved tissue mobility and reduced  pain after dry needling today.  Pt is independent and compliant with HEP.  Pt will continue to benefit from skilled PT for flexiblity, strength, endurance, manual and modalities.     Rehab Potential Good   Clinical Impairments Affecting Rehab Potential Fell on 06/29/2015 resulting with fractured skull, fractured T7, fractured shoulder, had 12 weeks of [facility] therapy   PT Frequency 3x / week   PT Duration 12 weeks   PT Treatment/Interventions Cryotherapy;Electrical Stimulation;Ultrasound;Traction;Moist Heat;Therapeutic activities;Therapeutic exercise;Neuromuscular re-education;Patient/family education;Passive range of motion;Manual techniques;Dry needling;Energy conservation;Taping   PT Next Visit Plan Low level postural strengthening; soft tissue work around the cervical; jaw, scalenes; anterior cervical;  Assess response to dry needling   Consulted and Agree with Plan of Care Patient      Patient will benefit from skilled therapeutic intervention in order to improve the following deficits and impairments:  Decreased range of motion, Increased fascial restricitons, Decreased endurance, Increased muscle spasms, Pain, Hypomobility, Impaired flexibility, Decreased mobility, Decreased strength  Visit Diagnosis: Pain in thoracic spine  Other muscle spasm  Muscle weakness (generalized)     Problem List Patient Active Problem List   Diagnosis Date Noted  . Contusion of knee 05/05/2016  . Fall involving sidewalk curb 05/05/2016  . Aphasia 03/13/2016  . Shoulder pain, bilateral 12/11/2015  . Fibromyalgia 12/11/2015  . Post concussion syndrome 11/01/2015  . Clavicle fracture 10/30/2015  . Hair loss 10/11/2015  . Pain in joint, shoulder region 09/11/2015  . Right wrist pain 08/07/2015  . Splinter in skin 07/30/2015  . Fall from ladder 07/30/2015  . Skull fracture with concussion ([place]) 07/30/2015  . Fracture of cervical vertebra, C6 ([place]) 07/30/2015  . Thoracic vertebral fracture  ([place]) 07/30/2015  . Abdominal pain, epigastric 05/07/2015  . Scaly patch rash 02/26/2015  . Mitral valve regurgitation 09/27/2014  . PAT (paroxysmal atrial tachycardia) ([place]) 09/06/2014  . Tachycardia 07/31/2014  . Sprain of ankle 06/29/2014  . Earache, left 05/15/2013  . Dizziness 05/15/2013  . Asthmatic bronchitis 05/08/2013  . Benign paroxysmal positional vertigo 05/08/2013  . Chest wall pain 01/26/2013  . LUQ abdominal pain 01/20/2013  . Insomnia 01/10/2013  . Depression (emotion) 01/10/2013  . URI, acute 08/25/2012  . Snoring 08/25/2012  . IBS (irritable bowel syndrome) 02/08/2012  .  Multiple pulmonary nodules 02/08/2012  . Chest heaviness 11/16/2010  . GERD 05/24/2010  . SINUSITIS, MAXILLARY, CHRONIC 05/20/2010  . Myalgia and myositis 11/20/2009  . Fatigue 11/20/2009  . ARTHRALGIA 06/26/2009  . Cervicogenic headache 06/26/2009  . TOBACCO USE, QUIT 06/26/2009  . LOW BACK PAIN 02/20/2009  . Rosacea 12/27/2008  . PRURITUS 12/27/2008  . Allergic urticaria 12/27/2008  . B12 deficiency 01/02/2008     Sigurd Sos, PT 05/06/16 10:17 AM  [facility] [address], [place], [place], [address] Phone: [phone]   Fax:  [phone]  Name: ROSARIE MATUSKA MRN: [id number] Date of Birth: Apr 03, 1965

## 2016-05-07 ENCOUNTER — Ambulatory Visit: Payer: BC Managed Care – PPO | Admitting: *Deleted

## 2016-05-07 DIAGNOSIS — R41841 Cognitive communication deficit: Secondary | ICD-10-CM

## 2016-05-07 NOTE — Therapy (Signed)
abdominal pain 01/20/2013  . Insomnia 01/10/2013  . Depression (emotion) 01/10/2013  . URI, acute 08/25/2012  . Snoring 08/25/2012  . IBS (irritable bowel syndrome) 02/08/2012  . Multiple pulmonary nodules 02/08/2012  . Chest heaviness 11/16/2010  . GERD 05/24/2010  . SINUSITIS, MAXILLARY, CHRONIC 05/20/2010  . Myalgia and myositis 11/20/2009  . Fatigue 11/20/2009  . ARTHRALGIA 06/26/2009  . Cervicogenic headache 06/26/2009  . TOBACCO USE, QUIT 06/26/2009  . LOW BACK PAIN 02/20/2009  . Rosacea 12/27/2008  . PRURITUS 12/27/2008  . Allergic urticaria 12/27/2008  . B12 deficiency 01/02/2008   Ezekeil Bethel B. [place], MSP, CCC-SLP  Shonna Chock 05/07/2016, 11:58 AM  [facility] [address] [place], [place], [address] Phone: [phone]   Fax:  [phone]   Name: Regina [name] MRN: [id number] Date of Birth: 07-May-1965  abdominal pain 01/20/2013  . Insomnia 01/10/2013  . Depression (emotion) 01/10/2013  . URI, acute 08/25/2012  . Snoring 08/25/2012  . IBS (irritable bowel syndrome) 02/08/2012  . Multiple pulmonary nodules 02/08/2012  . Chest heaviness 11/16/2010  . GERD 05/24/2010  . SINUSITIS, MAXILLARY, CHRONIC 05/20/2010  . Myalgia and myositis 11/20/2009  . Fatigue 11/20/2009  . ARTHRALGIA 06/26/2009  . Cervicogenic headache 06/26/2009  . TOBACCO USE, QUIT 06/26/2009  . LOW BACK PAIN 02/20/2009  . Rosacea 12/27/2008  . PRURITUS 12/27/2008  . Allergic urticaria 12/27/2008  . B12 deficiency 01/02/2008   Ezekeil Bethel B. [place], MSP, CCC-SLP  Shonna Chock 05/07/2016, 11:58 AM  [facility] [address] [place], [place], [address] Phone: [phone]   Fax:  [phone]   Name: Regina [name] MRN: [id number] Date of Birth: 07-May-1965  [facility] [address] [place], [place], [address] Phone: [phone]   Fax:  [phone]  Speech Language Pathology Treatment  Patient Details  Name: Regina [name] MRN: [id number] Date of Birth: Oct 08, 1965 Referring Provider: Dr. Alger Simons  Encounter Date: 05/07/2016      End of Session - 05/07/16 1147    Visit Number 7   Number of Visits 13   Date for SLP Re-Evaluation 05/19/16   SLP Start Time 0945   SLP Stop Time  1017   SLP Time Calculation (min) 32 min   Activity Tolerance Patient tolerated treatment well      Past Medical History:  Diagnosis Date  . Allergic urticaria   . ARTHRALGIA   . Arthritis   . Atrial fibrillation ([place])   . B12 DEFICIENCY   . CFS (chronic fatigue syndrome)   . Endometriosis   . Epidural hemorrhage with loss of consciousness ([place]) 06/29/15  . Fibroid   . Fibromyalgia   . Fracture of cervical vertebra, C6 ([place]) 06/29/15  . Gastritis   . GERD   . Heart murmur   . History of endometriosis   . IBS (irritable bowel syndrome)   . Internal hemorrhoids   . MVP (mitral valve prolapse)   . Osteoarthritis   . Rosacea   . Thoracic compression fracture ([place]) 06/29/15   T2-T6    Past Surgical History:  Procedure Laterality Date  . ABDOMINAL HYSTERECTOMY     partial  . ABDOMINAL HYSTERECTOMY    . ANAL FISSURE REPAIR     Dr Rise [name] 2009  . APPENDECTOMY    . COLPOSCOPY    . HEMORRHOID SURGERY     Dr. Rise [name] 2009  . OVARIAN CYST REMOVAL    . RT BUNIONECTOMT    . TONSILLECTOMY AND ADENOIDECTOMY    . TUBAL LIGATION    . UTERINE FIBROID SURGERY      There were no vitals filed for this visit.      Subjective Assessment - 05/07/16 0944    Subjective Set backs are frustrating. Ive been slipping up more with my speech since this fall (Monday)   Currently in Pain? Yes   Pain Score 4    Pain Location Neck               ADULT SLP TREATMENT - 05/07/16 0001       General Information   Behavior/Cognition Alert;Cooperative;Pleasant mood     Treatment Provided   Treatment provided Cognitive-Linquistic     Pain Assessment   Pain Assessment 0-10   Pain Score 4    Pain Location neck and cape   Pain Descriptors / Indicators --  stiff     Cognitive-Linquistic Treatment   Treatment focused on Cognition;Aphasia;Patient/family/caregiver education   Skilled Treatment Pt arrived late again today, without home work. SLP and pt discussed increase in linguistic errors in communication following her fall earlier this week. Pt reports seeing the doctor tuesday, and indicated everything was ok. Pt was able to describe various words without naming without difficulty. Pt was given level 1 synonyms and had more difficulty than she "would have before her fall" was able to identify 2-5 synonyms per word. Pt reports she challenges herself with everything she does, and gets frustrated when she is unable to perform at her premorbid level. SLP encouraged pt to keep in mind that her head injury may have created a new normal for her, and that challenging herself is good, but to

## 2016-05-11 ENCOUNTER — Encounter: Payer: Self-pay | Admitting: Physical Therapy

## 2016-05-11 ENCOUNTER — Ambulatory Visit (INDEPENDENT_AMBULATORY_CARE_PROVIDER_SITE_OTHER): Payer: BC Managed Care – PPO | Admitting: Psychology

## 2016-05-11 ENCOUNTER — Ambulatory Visit: Payer: BC Managed Care – PPO | Admitting: Physical Therapy

## 2016-05-11 DIAGNOSIS — F419 Anxiety disorder, unspecified: Secondary | ICD-10-CM

## 2016-05-11 DIAGNOSIS — S060X0S Concussion without loss of consciousness, sequela: Secondary | ICD-10-CM | POA: Diagnosis not present

## 2016-05-11 DIAGNOSIS — G4489 Other headache syndrome: Secondary | ICD-10-CM

## 2016-05-11 DIAGNOSIS — M546 Pain in thoracic spine: Secondary | ICD-10-CM | POA: Diagnosis not present

## 2016-05-11 DIAGNOSIS — M62838 Other muscle spasm: Secondary | ICD-10-CM

## 2016-05-11 DIAGNOSIS — F0781 Postconcussional syndrome: Secondary | ICD-10-CM

## 2016-05-11 DIAGNOSIS — M6281 Muscle weakness (generalized): Secondary | ICD-10-CM

## 2016-05-11 DIAGNOSIS — R41841 Cognitive communication deficit: Secondary | ICD-10-CM

## 2016-05-11 DIAGNOSIS — R4189 Other symptoms and signs involving cognitive functions and awareness: Secondary | ICD-10-CM

## 2016-05-11 NOTE — Progress Notes (Signed)
NEUROPSYCHOLOGICAL INTERVIEW (CPT: [id number])  Name: Regina [name] Date of Birth: March 17, 1966 Date of Interview: 05/11/2016  Reason for Referral:  Regina [name] is a 51 y.o. female who is referred for neuropsychological evaluation by Regina [name] of [facility] due to concerns about post concussion syndrome. This patient is unaccompanied in the office for today's appointment.  History of Presenting Problem:  Regina [name] reported that she fell 20 feet from an extension ladder on 06/29/2015 onto concrete. She does not think she lost consciousness. She was beginning to climb down an extension ladder from a tree house on her parents' property, she felt the ladder slide a little bit, panicked and grabbed a post on the treehouse. She does not remember hitting the ground. She remembers lying on the concrete and her daughter talking to her. She had the wind knocked out of her and could not breathe. Once she regained her breathing, she was able to answer questions appropriately. EMS was called and she went to the ED where she was admitted and reportedly hospitalized for five days. CT of the head on 06/29/2015 showed acute epidural hemorrhage overlying the right frontoparietal lobe with slight mass effect on the underlying frontoparietal lobe parenchyma, no appreciable midline shift, slightly displaced fracture of the right frontoparietal bone, and stable posterior fracture of the lamina at C6 bilaterally. She also had nondisplaced fractures at T3-T7. Per Regina [name] notes, she saw Regina [name], neurosurgeon at [facility], in May 2017 who repeated her CT scans which showed complete resolution of intracranial blood products and released her to orthopedics for her spine issues.  The patient reported that she noticed short term memory difficulties shortly after the accident. Previously, she states she had "a memory like an elephant". At first, her family got very frustrated by her forgetfulness because it was  so uncharacteristic for her. She complains of ongoing short term memory difficulties, feeling as if she is in a fog "and going through the motions but I don't really know how", inability to picture routes in her head when she is driving, forgetting (briefly) where she parked in a parking lot, paraphasic errors when speaking, transposing letters when typing, forgetting appointments unless a reminder alarm goes off, inability to multitask, distractibility, . She notes that before the injury, she was doing Lumosity regularly, and after the injury her scores went down significantly and still have not improved to the level she was at before.  She has been engaging in cognitive rehabilitation through SLP at the [facility] in [place], as well as physical therapy at the [facility] in [place]. She notes frustration surrounding being told that her cognitive functioning is going to get better. She states should be happier if someone told her "this is the new normal, deal with it".   She states she is unable to work full time due to her cognitive difficulties. 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 She states that she now would not be able to concentrate for a full 8-hour day. She is able to do some part-time work. 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 She notes she has to check and double check her work and still may not catch errors.   Regina [name] continues to manage all instrumental ADLs, including driving (uses GPS), medications (sets timers to remind her), finances, appointments (sets alarm reminders), and cooking.  Physically, she complains of chronic  headaches and daily neck pain. Her headaches are getting better with PT. She has BPPV. Normally she doesn't have any nausea but with one exercise at PT she does experience some nausea. She complains of reduced balance since the  injury.  She has no prior history of head injury. She has had one fall since the accident in 06/2015. Last week, she fell in front of her PT office. She tripped on the curb. She did hit the ground with her face. The following day, she reportedly demonstrated more language difficulties at her rehab session.   The patient denies any psychiatric history prior to her injury. She denied history of mental health treatment or suicidal ideation/intention. Since the injury, she has experienced significant anxiety. She has had one "full blown panic attack" but frequently experiences panic symptoms. She is learning to cope with these symptoms better. She denies any significant or ongoing depression. Her sleep is poor; she has to wake up frequently to change position. Her appetite is good. She has gained 40 lbs, likely due to reduced physical activity secondary to pain. She notes that she has less interest in leaving her home than she used to but she does not avoid doing so. She is taking Effexor but is not sure when that was started. She feels very supported by her family and friends.  The patient states that her goals are to "understand the new normal, how to cope with it, and how can go back to work full time."   Social History: Born/Raised: [place] Education: Manufacturing engineer of arts in [place] history: Teaching since 2000 Marital history: She has been married twice. She is divorced from her first husband. She has been married to her current husband for 18 mos, supportive relationship. She has 2 daughters (ages 31 and 97) and 3 grandchildren. Alcohol/Tobacco/Substances: usually 1 drink, maybe once a week. Never a heavy drinker. Former smoker until pregnant with first child (quit 27 years ago). No SA.   Medical History: Past Medical History:  Diagnosis Date  . Allergic urticaria   . ARTHRALGIA   . Arthritis   . Atrial fibrillation ([place])   . B12 DEFICIENCY   . CFS (chronic fatigue syndrome)   .  Endometriosis   . Epidural hemorrhage with loss of consciousness ([place]) 06/29/15  . Fibroid   . Fibromyalgia   . Fracture of cervical vertebra, C6 ([place]) 06/29/15  . Gastritis   . GERD   . Heart murmur   . History of endometriosis   . IBS (irritable bowel syndrome)   . Internal hemorrhoids   . MVP (mitral valve prolapse)   . Osteoarthritis   . Rosacea   . Thoracic compression fracture ([place]) 06/29/15   T2-T6     Current Medications:  Outpatient Encounter Prescriptions as of 05/11/2016  Medication Sig  . Acetaminophen-Codeine (TYLENOL/CODEINE #3) 300-30 MG tablet Take 0.5-1 tablets by mouth every 4 (four) hours as needed for pain.  Marland Kitchen amitriptyline (ELAVIL) 10 MG tablet Take 1 tablet (10 mg total) by mouth at bedtime.  . CASCARA SAGRADA PO Take by mouth.  . Cholecalciferol (GNP VITAMIN D) 1000 units tablet Take 2 tablets (2,000 Units total) by mouth daily.  . cyanocobalamin (,VITAMIN B-12,) 1000 MCG/ML injection Inject 1 mL (1,000 mcg total) into the skin every 14 (fourteen) days.  . ergocalciferol (VITAMIN D2) 50000 units capsule Take 1 capsule (50,000 Units total) by mouth once a week.  . fluocinonide (LIDEX) 0.05 % external solution   . gabapentin (NEURONTIN) 100 MG  capsule   . hydrocortisone 2.5 % cream Apply topically 2 (two) times daily.  . Phosphatidylserine-DHA-EPA (VAYACOG) 100-19.5-6.5 MG CAPS Take 1 capsule by mouth daily.  . SYRINGE-NEEDLE, DISP, 3 ML (BD ECLIPSE SYRINGE) 25G X 1" 3 ML MISC Use IM  . Turmeric 500 MG TABS Take by mouth 2 (two) times daily.  Marland Kitchen venlafaxine XR (EFFEXOR XR) 37.5 MG 24 hr capsule Take 1 capsule (37.5 mg total) by mouth daily with breakfast.  . vitamin B-12 (CYANOCOBALAMIN) 1000 MCG tablet Take 1,000 mcg by mouth daily.   No facility-administered encounter medications on file as of 05/11/2016.     Behavioral Observations:   Appearance: Neatly and casually dressed, appropriately groomed Gait: Ambulated independently, no gross abnormalities  observed Speech: Fluent; normal rate, rhythm and volume. Mild to moderate word finding difficulty, which seems exacerbated by anxiety. Patient is very vigilant of word finding difficulties and points out any difficulty during the interview. Thought process: Linear, goal directed Affect: Full, generally euthymic, appropriate to situation, stable Interpersonal: Pleasant, appropriate   TESTING: There is medical necessity to proceed with neuropsychological assessment as the results will be used to aid in differential diagnosis and clinical decision-making and to inform specific treatment recommendations. 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   PLAN: The patient will return for a full battery of neuropsychological testing with a psychometrician under my supervision. Education regarding testing procedures was provided. Subsequently, the patient will see this provider for a follow-up session at which time her test performances and my impressions and treatment recommendations will be reviewed in detail.   Full neuropsychological evaluation report to follow.

## 2016-05-11 NOTE — Therapy (Signed)
[facility] [address], [place], [place], [address] Phone: [phone]   Fax:  [phone]  Physical Therapy Treatment  Patient Details  Name: Regina [name] MRN: [id number] Date of Birth: November 28, 1965 Referring Provider: Dr. Lew Dawes  Encounter Date: 05/11/2016      PT End of Session - 05/11/16 0943    Visit Number 11   Date for PT Re-Evaluation 06/24/16   PT Start Time 0938   PT Stop Time 1030   PT Time Calculation (min) 52 min   Activity Tolerance Patient tolerated treatment well   Behavior During Therapy [facility] for tasks assessed/performed      Past Medical History:  Diagnosis Date  . Allergic urticaria   . ARTHRALGIA   . Arthritis   . Atrial fibrillation ([place])   . B12 DEFICIENCY   . CFS (chronic fatigue syndrome)   . Endometriosis   . Epidural hemorrhage with loss of consciousness ([place]) 06/29/15  . Fibroid   . Fibromyalgia   . Fracture of cervical vertebra, C6 ([place]) 06/29/15  . Gastritis   . GERD   . Heart murmur   . History of endometriosis   . IBS (irritable bowel syndrome)   . Internal hemorrhoids   . MVP (mitral valve prolapse)   . Osteoarthritis   . Rosacea   . Thoracic compression fracture ([place]) 06/29/15   T2-T6    Past Surgical History:  Procedure Laterality Date  . ABDOMINAL HYSTERECTOMY     partial  . ABDOMINAL HYSTERECTOMY    . ANAL FISSURE REPAIR     Dr Rise [name] 2009  . APPENDECTOMY    . COLPOSCOPY    . HEMORRHOID SURGERY     Dr. Rise [name] 2009  . OVARIAN CYST REMOVAL    . RT BUNIONECTOMT    . TONSILLECTOMY AND ADENOIDECTOMY    . TUBAL LIGATION    . UTERINE FIBROID SURGERY      There were no vitals filed for this visit.      Subjective Assessment - 05/11/16 0941    Subjective Pt reports having some increased stiffness and pain over all since falling while coming into clinic last week. Pt reports she feels like shes having a set back since the fall and the  stiffness of Rt shoulder that had gone away has now come back. Pt also reports feeling that she has a catch in Rt hip.    Pertinent History fibromyalgia;  had previous PT neck and shoulder   Patient Stated Goals reduce pain, turn neck instead of body   Currently in Pain? Yes   Pain Score 4    Pain Location Neck   Pain Orientation Left   Pain Descriptors / Indicators Tightness   Pain Type Acute pain   Pain Onset In the past 7 days  Increased since fall last week   Pain Frequency Constant   Aggravating Factors  Turning head   Pain Relieving Factors traction                         [facility] Adult PT Treatment/Exercise - 05/11/16 0001      Neck Exercises: Machines for Strengthening   UBE (Upper Arm Bike) Level 1x  4 minutes   PT present to discuss progress     Neck Exercises: Supine   Other Supine Exercise Horizontal abduction  on foam roller yellow x10   Other Supine Exercise lay on foam roll, decompress 2 min; alterante shoulder  flexion 10x; squeeze shoulder blades 5 sec 10x; retract head into roll 5 sec 10x;      Modalities   Modalities Electrical Stimulation;Moist Heat     Moist Heat Therapy   Number Minutes Moist Heat 15 Minutes   Moist Heat Location Cervical     Electrical Stimulation   Electrical Stimulation Location Bil upper traps   Electrical Stimulation Action IFC   Electrical Stimulation Parameters 15 minutes   Electrical Stimulation Goals Pain     Manual Therapy   Manual Therapy Soft tissue mobilization;Myofascial release   Manual therapy comments Pt prone   Soft tissue mobilization Suboccipitals, SCM, and cervical muscles; scalenes; lateral pterygoid muscle; platysimus; masseter                  PT Short Term Goals - 05/11/16 0943      PT SHORT TERM GOAL #2   Title headaches decreased in frequency by 25%   Time 4   Period Weeks   Status On-going     PT SHORT TERM GOAL #3   Title ability to turn her head with pain decreased >/=  25% due to increase tissue mobility   Time 4   Period Weeks   Status On-going           PT Long Term Goals - 05/11/16 0944      PT LONG TERM GOAL #1   Title independent with HEP and understand how to progress herself   Time 12   Period Weeks   Status On-going     PT LONG TERM GOAL #2   Title ability to look upward into a cabinet with >/= 50% greater ease due to increase mobility and decreased pain.   Time 12   Period Weeks   Status On-going     PT LONG TERM GOAL #4   Title ability to vacuum, clean dishes and cook with >/= 50% greater ease due to increased strength and decreased pain   Time 12   Period Weeks   Status On-going     PT LONG TERM GOAL #5   Title FOTO score </= 45% limitation   Time 12   Period Weeks   Status On-going               Plan - 05/11/16 0947    Clinical Impression Statement Pt continues to have increase tension in Bil neck and shoudlers. Pt responded well to sry needling but reports no improvement after session. Pt able to complete all exercsies need verbal cues in use core muscles and maintain good hip/ knee/ spinal alaignment for stability.  Pt had tenderness with manual therapy along Lt vertebral boarder of scapula.    Rehab Potential Good   Clinical Impairments Affecting Rehab Potential Fell on 06/29/2015 resulting with fractured skull, fractured T7, fractured shoulder, had 12 weeks of [facility] therapy   PT Frequency 3x / week   PT Duration 12 weeks   PT Treatment/Interventions Cryotherapy;Electrical Stimulation;Ultrasound;Traction;Moist Heat;Therapeutic activities;Therapeutic exercise;Neuromuscular re-education;Patient/family education;Passive range of motion;Manual techniques;Dry needling;Energy conservation;Taping   PT Next Visit Plan Postural strengthneing as tolerated, soft tisuue work   Oncologist with Plan of Care Patient      Patient will benefit from skilled therapeutic intervention in order to improve the  following deficits and impairments:  Decreased range of motion, Increased fascial restricitons, Decreased endurance, Increased muscle spasms, Pain, Hypomobility, Impaired flexibility, Decreased mobility, Decreased strength  Visit Diagnosis: Cognitive communication deficit  Pain in thoracic spine  Other muscle spasm  Muscle weakness (generalized)  Other headache syndrome     Problem List Patient Active Problem List   Diagnosis Date Noted  . Contusion of knee 05/05/2016  . Fall involving sidewalk curb 05/05/2016  . Aphasia 03/13/2016  . Shoulder pain, bilateral 12/11/2015  . Fibromyalgia 12/11/2015  . Post concussion syndrome 11/01/2015  . Clavicle fracture 10/30/2015  . Hair loss 10/11/2015  . Pain in joint, shoulder region 09/11/2015  . Right wrist pain 08/07/2015  . Splinter in skin 07/30/2015  . Fall from ladder 07/30/2015  . Skull fracture with concussion ([place]) 07/30/2015  . Fracture of cervical vertebra, C6 ([place]) 07/30/2015  . Thoracic vertebral fracture ([place]) 07/30/2015  . Abdominal pain, epigastric 05/07/2015  . Scaly patch rash 02/26/2015  . Mitral valve regurgitation 09/27/2014  . PAT (paroxysmal atrial tachycardia) ([place]) 09/06/2014  . Tachycardia 07/31/2014  . Sprain of ankle 06/29/2014  . Earache, left 05/15/2013  . Dizziness 05/15/2013  . Asthmatic bronchitis 05/08/2013  . Benign paroxysmal positional vertigo 05/08/2013  . Chest wall pain 01/26/2013  . LUQ abdominal pain 01/20/2013  . Insomnia 01/10/2013  . Depression (emotion) 01/10/2013  . URI, acute 08/25/2012  . Snoring 08/25/2012  . IBS (irritable bowel syndrome) 02/08/2012  . Multiple pulmonary nodules 02/08/2012  . Chest heaviness 11/16/2010  . GERD 05/24/2010  . SINUSITIS, MAXILLARY, CHRONIC 05/20/2010  . Myalgia and myositis 11/20/2009  . Fatigue 11/20/2009  . ARTHRALGIA 06/26/2009  . Cervicogenic headache 06/26/2009  . TOBACCO USE, QUIT 06/26/2009  . LOW BACK PAIN 02/20/2009  . Rosacea  12/27/2008  . PRURITUS 12/27/2008  . Allergic urticaria 12/27/2008  . B12 deficiency 01/02/2008    Mikle Bosworth PTA 05/11/2016, 11:52 AM  [facility] [address] [place], [place], [address] Phone: [phone]   Fax:  [phone]  Name: CASIMERA GERADS MRN: [id number] Date of Birth: 18-May-1965

## 2016-05-12 ENCOUNTER — Encounter: Payer: Self-pay | Admitting: Psychology

## 2016-05-12 ENCOUNTER — Ambulatory Visit: Payer: BC Managed Care – PPO | Admitting: Speech Pathology

## 2016-05-12 ENCOUNTER — Encounter: Payer: Self-pay | Admitting: Internal Medicine

## 2016-05-12 ENCOUNTER — Ambulatory Visit (INDEPENDENT_AMBULATORY_CARE_PROVIDER_SITE_OTHER): Payer: BC Managed Care – PPO | Admitting: Internal Medicine

## 2016-05-12 VITALS — BP 130/70 | HR 73 | Temp 97.8°F | Resp 20 | Wt 170.2 lb

## 2016-05-12 DIAGNOSIS — E538 Deficiency of other specified B group vitamins: Secondary | ICD-10-CM | POA: Diagnosis not present

## 2016-05-12 DIAGNOSIS — R4701 Aphasia: Secondary | ICD-10-CM

## 2016-05-12 DIAGNOSIS — R41841 Cognitive communication deficit: Secondary | ICD-10-CM

## 2016-05-12 DIAGNOSIS — F0781 Postconcussional syndrome: Secondary | ICD-10-CM

## 2016-05-12 DIAGNOSIS — B079 Viral wart, unspecified: Secondary | ICD-10-CM | POA: Insufficient documentation

## 2016-05-12 DIAGNOSIS — R42 Dizziness and giddiness: Secondary | ICD-10-CM

## 2016-05-12 DIAGNOSIS — B078 Other viral warts: Secondary | ICD-10-CM

## 2016-05-12 DIAGNOSIS — S12591D Other nondisplaced fracture of sixth cervical vertebra, subsequent encounter for fracture with routine healing: Secondary | ICD-10-CM

## 2016-05-12 DIAGNOSIS — S8001XA Contusion of right knee, initial encounter: Secondary | ICD-10-CM | POA: Diagnosis not present

## 2016-05-12 NOTE — Assessment & Plan Note (Signed)
Better  

## 2016-05-12 NOTE — Therapy (Signed)
[facility] [address] [place], [place], [address] Phone: [phone]   Fax:  [phone]  Speech Language Pathology Treatment  Patient Details  Name: Regina [name] MRN: [id number] Date of Birth: 1966/03/06 Referring Provider: Dr. Faith Rogue  Encounter Date: 05/12/2016      End of Session - 05/12/16 1225    Visit Number 8   Number of Visits 13   Date for SLP Re-Evaluation 05/19/16   SLP Start Time 0945   SLP Stop Time  1015   SLP Time Calculation (min) 30 min   Activity Tolerance Patient tolerated treatment well      Past Medical History:  Diagnosis Date  . Allergic urticaria   . ARTHRALGIA   . Arthritis   . Atrial fibrillation ([facility])   . B12 DEFICIENCY   . CFS (chronic fatigue syndrome)   . Endometriosis   . Epidural hemorrhage with loss of consciousness ([facility]) 06/29/15  . Fibroid   . Fibromyalgia   . Fracture of cervical vertebra, C6 ([facility]) 06/29/15  . Gastritis   . GERD   . Heart murmur   . History of endometriosis   . IBS (irritable bowel syndrome)   . Internal hemorrhoids   . MVP (mitral valve prolapse)   . Osteoarthritis   . Rosacea   . Thoracic compression fracture ([facility]) 06/29/15   T2-T6    Past Surgical History:  Procedure Laterality Date  . ABDOMINAL HYSTERECTOMY     partial  . ABDOMINAL HYSTERECTOMY    . ANAL FISSURE REPAIR     Dr Zachery Dakins 2009  . APPENDECTOMY    . COLPOSCOPY    . HEMORRHOID SURGERY     Dr. Zachery Dakins 2009  . OVARIAN CYST REMOVAL    . RT BUNIONECTOMT    . TONSILLECTOMY AND ADENOIDECTOMY    . TUBAL LIGATION    . UTERINE FIBROID SURGERY      There were no vitals filed for this visit.      Subjective Assessment - 05/12/16 0951    Subjective "I goofed and went to the wrong place is why I'm late"               ADULT SLP TREATMENT - 05/12/16 0951      General Information   Behavior/Cognition Alert;Cooperative;Pleasant mood     Treatment Provided    Treatment provided Cognitive-Linquistic     Pain Assessment   Pain Assessment 0-10   Pain Score 3    Pain Location neck and cape   Pain Descriptors / Indicators Sore   Pain Intervention(s) Monitored during session     Cognitive-Linquistic Treatment   Treatment focused on Cognition;Aphasia;Patient/family/caregiver education   Skilled Treatment Pt arrived 15 min late due to putting the wrong address in her GPS. Pt reported relative ease doing moderately complex reasoning/detailed attention task.  She reports success carrying over memory strategies and triple checking her online work . Complex functional mathmatical problem solving with extended time and rare min A.     Assessment / Recommendations / Plan   Plan Continue with current plan of care     Progression Toward Goals   Progression toward goals Progressing toward goals              SLP Long Term Goals - 05/12/16 1225      SLP LONG TERM GOAL #1   Title Pt will utilize external aids to manage schedule, lists, recurring and non recurring tasks over 3 sessions with mod  I   Baseline 1/25, 2/1, 2/6, 2/8   Status Achieved     SLP LONG TERM GOAL #2   Title Pt will divide attention between 2 complex cognitive linguistic tasks with 95% on each and supervision cues.    Time 1   Period Weeks   Status On-going     SLP LONG TERM GOAL #3   Title Pt will complete complex abstract naming tasks with 95% accuracy and mod I   Time 1   Period Weeks   Status On-going     SLP LONG TERM GOAL #4   Title Pt will utilize compensations for word finding episodes with mod I during complex conversations over 2 sessions.   Status Achieved          Plan - 05/12/16 1224    Clinical Impression Statement D/w pt returning to work. I expressed concern re: her divided attention, memory and fatigue by the end of her work day. Suggested she ask her work what happens if she is not successful in her current position. Pt states she will loose her  position if she does not return by 4/1. Today, she required extended time with complex functional math word problems. Continue skilled ST to maximize cognition for possible eventual return to work.   Speech Therapy Frequency 2x / week   Treatment/Interventions Compensatory strategies;Patient/family education;Functional tasks;Cueing hierarchy;Cognitive reorganization;SLP instruction and feedback;Internal/external aids;Language facilitation;Environmental controls   Potential to Achieve Goals Good   Potential Considerations Ability to learn/carryover information;Family/community support;Previous level of function;Cooperation/participation level   Consulted and Agree with Plan of Care Patient      Patient will benefit from skilled therapeutic intervention in order to improve the following deficits and impairments:   Cognitive communication deficit    Problem List Patient Active Problem List   Diagnosis Date Noted  . Wart viral 05/12/2016  . Contusion of knee 05/05/2016  . Fall involving sidewalk curb 05/05/2016  . Aphasia 03/13/2016  . Shoulder pain, bilateral 12/11/2015  . Fibromyalgia 12/11/2015  . Post concussion syndrome 11/01/2015  . Clavicle fracture 10/30/2015  . Hair loss 10/11/2015  . Pain in joint, shoulder region 09/11/2015  . Right wrist pain 08/07/2015  . Splinter in skin 07/30/2015  . Fall from ladder 07/30/2015  . Skull fracture with concussion ([facility]) 07/30/2015  . Fracture of cervical vertebra, C6 ([facility]) 07/30/2015  . Thoracic vertebral fracture ([facility]) 07/30/2015  . Abdominal pain, epigastric 05/07/2015  . Scaly patch rash 02/26/2015  . Mitral valve regurgitation 09/27/2014  . PAT (paroxysmal atrial tachycardia) ([facility]) 09/06/2014  . Tachycardia 07/31/2014  . Sprain of ankle 06/29/2014  . Earache, left 05/15/2013  . Dizziness 05/15/2013  . Asthmatic bronchitis 05/08/2013  . Benign paroxysmal positional vertigo 05/08/2013  . Chest wall pain 01/26/2013  . LUQ  abdominal pain 01/20/2013  . Insomnia 01/10/2013  . Depression (emotion) 01/10/2013  . URI, acute 08/25/2012  . Snoring 08/25/2012  . IBS (irritable bowel syndrome) 02/08/2012  . Multiple pulmonary nodules 02/08/2012  . Chest heaviness 11/16/2010  . GERD 05/24/2010  . SINUSITIS, MAXILLARY, CHRONIC 05/20/2010  . Myalgia and myositis 11/20/2009  . Fatigue 11/20/2009  . ARTHRALGIA 06/26/2009  . Cervicogenic headache 06/26/2009  . TOBACCO USE, QUIT 06/26/2009  . LOW BACK PAIN 02/20/2009  . Rosacea 12/27/2008  . PRURITUS 12/27/2008  . Allergic urticaria 12/27/2008  . B12 deficiency 01/02/2008    Tarrah Furuta, Radene Journey  MS, CCC-SLP 05/12/2016, 12:26 PM  [facility] [address] [place],  [place], [address] Phone: [phone]   Fax:  [phone]   Name: Regina [name] MRN: [id number] Date of Birth: Apr 04, 1965

## 2016-05-12 NOTE — Assessment & Plan Note (Signed)
On B12 inj 

## 2016-05-12 NOTE — Progress Notes (Signed)
Subjective:  Patient ID: Regina [name], female    DOB: 1965/08/12  Age: 51 y.o. MRN: [id number]  CC: No chief complaint on file.   HPI LABRISHA EDGETT presents for a recent fall f/u. HAs are better F/u post-concussion issues: memory loss, speech difficulties, fatigue C/o a spot on R back  Outpatient Medications Prior to Visit  Medication Sig Dispense Refill  . Acetaminophen-Codeine (TYLENOL/CODEINE #3) 300-30 MG tablet Take 0.5-1 tablets by mouth every 4 (four) hours as needed for pain. 60 tablet 1  . amitriptyline (ELAVIL) 10 MG tablet Take 1 tablet (10 mg total) by mouth at bedtime. 30 tablet 5  . CASCARA SAGRADA PO Take by mouth.    . Cholecalciferol (GNP VITAMIN D) 1000 units tablet Take 2 tablets (2,000 Units total) by mouth daily. 100 tablet 3  . cyanocobalamin (,VITAMIN B-12,) 1000 MCG/ML injection Inject 1 mL (1,000 mcg total) into the skin every 14 (fourteen) days. 10 mL 11  . ergocalciferol (VITAMIN D2) 50000 units capsule Take 1 capsule (50,000 Units total) by mouth once a week. 6 capsule 0  . fluocinonide (LIDEX) 0.05 % external solution     . gabapentin (NEURONTIN) 100 MG capsule     . hydrocortisone 2.5 % cream Apply topically 2 (two) times daily. 40 g 1  . Phosphatidylserine-DHA-EPA (VAYACOG) 100-19.5-6.5 MG CAPS Take 1 capsule by mouth daily. 30 capsule 11  . SYRINGE-NEEDLE, DISP, 3 ML (BD ECLIPSE SYRINGE) 25G X 1" 3 ML MISC Use IM 50 each 11  . Turmeric 500 MG TABS Take by mouth 2 (two) times daily.    Marland Kitchen venlafaxine XR (EFFEXOR XR) 37.5 MG 24 hr capsule Take 1 capsule (37.5 mg total) by mouth daily with breakfast. 30 capsule 1  . vitamin B-12 (CYANOCOBALAMIN) 1000 MCG tablet Take 1,000 mcg by mouth daily.     No facility-administered medications prior to visit.     ROS Review of Systems  Constitutional: Positive for fatigue. Negative for activity change, appetite change, chills and unexpected weight change.  HENT: Negative for congestion, mouth sores and sinus  pressure.   Eyes: Negative for visual disturbance.  Respiratory: Negative for cough and chest tightness.   Gastrointestinal: Negative for abdominal pain and nausea.  Genitourinary: Negative for difficulty urinating, frequency and vaginal pain.  Musculoskeletal: Positive for arthralgias, back pain, neck pain and neck stiffness. Negative for gait problem.  Skin: Negative for pallor and rash.  Neurological: Negative for dizziness, tremors, weakness, numbness and headaches.  Psychiatric/Behavioral: Positive for decreased concentration. Negative for confusion, sleep disturbance and suicidal ideas.    Objective:  BP 130/70   Pulse 73   Temp 97.8 F (36.6 C) (Oral)   Resp 20   Wt 170 lb 4 oz (77.2 kg)   SpO2 98%   BMI 29.22 kg/m   BP Readings from Last 3 Encounters:  05/12/16 130/70  05/05/16 128/80  04/20/16 100/64    Wt Readings from Last 3 Encounters:  05/12/16 170 lb 4 oz (77.2 kg)  05/05/16 171 lb 4 oz (77.7 kg)  04/20/16 169 lb 6 oz (76.8 kg)    Physical Exam  Constitutional: She appears well-developed. No distress.  HENT:  Head: Normocephalic.  Right Ear: External ear normal.  Left Ear: External ear normal.  Nose: Nose normal.  Mouth/Throat: Oropharynx is clear and moist.  Eyes: Conjunctivae are normal. Pupils are equal, round, and reactive to light. Right eye exhibits no discharge. Left eye exhibits no discharge.  Neck: Normal range of motion.  Neck supple. No JVD present. No tracheal deviation present. No thyromegaly present.  Cardiovascular: Normal rate, regular rhythm and normal heart sounds.   Pulmonary/Chest: No stridor. No respiratory distress. She has no wheezes.  Abdominal: Soft. Bowel sounds are normal. She exhibits no distension and no mass. There is no tenderness. There is no rebound and no guarding.  Musculoskeletal: She exhibits tenderness. She exhibits no edema.  Lymphadenopathy:    She has no cervical adenopathy.  Neurological: She displays normal  reflexes. No cranial nerve deficit. She exhibits normal muscle tone. Coordination normal.  Skin: No rash noted. No erythema.  Psychiatric: She has a normal mood and affect. Her behavior is normal. Judgment and thought content normal.  neck - tender w/ROM Knees tender R>>L Wart R thor back   Procedure Note :     Procedure : Cryosurgery   Indication:  Wart on back   Risks including unsuccessful procedure , bleeding, infection, bruising, scar, a need for a repeat  procedure and others were explained to the patient in detail as well as the benefits. Informed consent was obtained verbally.   1  lesion(s)  on Rback   was/were treated with liquid nitrogen on a Q-tip in a usual fasion . Band-Aid was applied and antibiotic ointment was given for a later use.   Tolerated well. Complications none.      Lab Results  Component Value Date   WBC 7.2 09/11/2015   HGB 13.4 09/11/2015   HCT 39.4 09/11/2015   PLT 385.0 09/11/2015   GLUCOSE 106 (H) 09/11/2015   CHOL 201 (H) 02/26/2015   TRIG 65.0 02/26/2015   HDL 62.10 02/26/2015   LDLCALC 126 (H) 02/26/2015   ALT 18 09/11/2015   AST 19 09/11/2015   NA 138 09/11/2015   K 3.5 09/11/2015   CL 101 09/11/2015   CREATININE 0.62 09/11/2015   BUN 11 09/11/2015   CO2 29 09/11/2015   TSH 1.19 09/11/2015   INR 1.16 06/29/2015    Mr Wrist Right W Contrast  Result Date: 12/06/2015 CLINICAL DATA:  Right wrist pain status post fall. Numbness and weakness. Limited range of motion. EXAM: MRI OF THE RIGHT WRIST WITH CONTRAST(MR Arthrogram) TECHNIQUE: Multiplanar, multisequence MR imaging of the wrist was performed immediately following contrast injection into the radiocarpal joint under fluoroscopic guidance. No intravenous contrast was administered. COMPARISON:  None. FINDINGS: Ligaments: Intact scapholunate and lunotriquetral ligaments. Triangular fibrocartilage: Intact. Tendons: Intact flexor and extensor compartment tendons. Carpal tunnel/median nerve:  Normal. Guyon's canal: Normal. Joint/cartilage: Intraarticular contrast in the radiocarpal joint distending the joint capsule. No chondral defect. No intra-articular loose body. Bones/carpal alignment: No marrow signal abnormality. No fracture or dislocation. Normal alignment. Other: No fluid collection or hematoma. IMPRESSION: 1. No internal derangement of the right wrist. Electronically Signed   By: Kathreen Devoid   On: 12/06/2015 17:08   Dg Fluoro Guided Needle Plc Aspiration/injection Loc  Result Date: 12/06/2015 CLINICAL DATA:  Right wrist pain.  20 foot fall 5 months ago. FLUOROSCOPY TIME:  Radiation Exposure Index (as provided by the fluoroscopic device): 0.93 uGy*m2 Fluoroscopy Time:  18 seconds Number of Acquired Images:  0 PROCEDURE: Right WRIST INJECTION UNDER FLUOROSCOPY An appropriate skin entrance site was determined. The site was marked, prepped with Betadine, draped in the usual sterile fashion, and infiltrated locally with 1% Lidocaine. A 25 gauge skin needle was advanced into the radiocarpal joint under intermittent fluoroscopy. 0.1 mL MultiHance was diluted and 20 mL of Isovue M 200. This was then  used to opacify the proximal carpal joint. No immediate complication. IMPRESSION: Technically successful right wrist injection for MRI. Electronically Signed   By: San Morelle M.D.   On: 12/06/2015 16:18    Assessment & Plan:   There are no diagnoses linked to this encounter. I am having Ms. Dewberry maintain her SYRINGE-NEEDLE (DISP) 3 ML, cyanocobalamin, CASCARA SAGRADA PO, Phosphatidylserine-DHA-EPA, Acetaminophen-Codeine, Cholecalciferol, vitamin B-12, Turmeric, hydrocortisone, ergocalciferol, venlafaxine XR, fluocinonide, gabapentin, and amitriptyline.  No orders of the defined types were placed in this encounter.    Follow-up: No Follow-up on file.  Walker Kehr, MD

## 2016-05-12 NOTE — Assessment & Plan Note (Signed)
Better Cont w/speech therapies

## 2016-05-12 NOTE — Assessment & Plan Note (Signed)
Neuro-psych testing is pending This should help to determine whether she could go back to work

## 2016-05-12 NOTE — Patient Instructions (Signed)
   Postprocedure instructions :     Keep the wounds clean. You can wash them with liquid soap and water. Pat dry with gauze or a Kleenex tissue  Before applying antibiotic ointment and a Band-Aid.   You need to report immediately  if  any signs of infection develop.    

## 2016-05-12 NOTE — Assessment & Plan Note (Signed)
Occ re-occurrence

## 2016-05-12 NOTE — Progress Notes (Signed)
Pre visit review using our clinic review tool, if applicable. No additional management support is needed unless otherwise documented below in the visit note. 

## 2016-05-12 NOTE — Assessment & Plan Note (Signed)
Neck pain continues In PT

## 2016-05-12 NOTE — Telephone Encounter (Signed)
Patient cancelled procedure appointment for Thursday because she has another appointment that morning. Would like a call back to reschedule.

## 2016-05-12 NOTE — Assessment & Plan Note (Signed)
See Cryo 

## 2016-05-13 ENCOUNTER — Ambulatory Visit: Payer: BC Managed Care – PPO

## 2016-05-13 DIAGNOSIS — M546 Pain in thoracic spine: Secondary | ICD-10-CM | POA: Diagnosis not present

## 2016-05-13 DIAGNOSIS — M6281 Muscle weakness (generalized): Secondary | ICD-10-CM

## 2016-05-13 DIAGNOSIS — M62838 Other muscle spasm: Secondary | ICD-10-CM

## 2016-05-13 NOTE — Therapy (Signed)
[facility] [address] [place], [place], [address] Phone: [phone]   Fax:  [phone]  Physical Therapy Treatment  Patient Details  Name: Regina [name] MRN: [id number] Date of Birth: [birth year] Referring Provider: Dr. Lew Dawes  Encounter Date: 05/13/2016      PT End of Session - 05/13/16 1010    Visit Number 12   Date for PT Re-Evaluation 06/24/16   PT Start Time 0939   PT Stop Time 1026  late and dry needling   PT Time Calculation (min) 47 min   Activity Tolerance Patient tolerated treatment well   Behavior During Therapy [facility] for tasks assessed/performed      Past Medical History:  Diagnosis Date  . Allergic urticaria   . ARTHRALGIA   . Arthritis   . Atrial fibrillation ([place])   . B12 DEFICIENCY   . CFS (chronic fatigue syndrome)   . Endometriosis   . Epidural hemorrhage with loss of consciousness ([place]) 06/29/15  . Fibroid   . Fibromyalgia   . Fracture of cervical vertebra, C6 ([place]) 06/29/15  . Gastritis   . GERD   . Heart murmur   . History of endometriosis   . IBS (irritable bowel syndrome)   . Internal hemorrhoids   . MVP (mitral valve prolapse)   . Osteoarthritis   . Rosacea   . Thoracic compression fracture ([place]) 06/29/15   T2-T6    Past Surgical History:  Procedure Laterality Date  . ABDOMINAL HYSTERECTOMY     partial  . ABDOMINAL HYSTERECTOMY    . ANAL FISSURE REPAIR     Dr Rise [name] 2009  . APPENDECTOMY    . COLPOSCOPY    . HEMORRHOID SURGERY     Dr. Rise [name] 2009  . OVARIAN CYST REMOVAL    . RT BUNIONECTOMT    . TONSILLECTOMY AND ADENOIDECTOMY    . TUBAL LIGATION    . UTERINE FIBROID SURGERY      There were no vitals filed for this visit.      Subjective Assessment - 05/13/16 0938    Subjective Pt reports that she is improving since her fall.  Lt>Rt stiffness/pain today.     Patient Stated Goals reduce pain, turn neck instead of body   Currently in Pain?  Yes   Pain Score 3    Pain Location Neck   Pain Orientation Left   Pain Descriptors / Indicators Tightness   Pain Type Acute pain   Pain Onset More than a month ago   Pain Frequency Constant                         [facility] Adult PT Treatment/Exercise - 05/13/16 0001      Neck Exercises: Machines for Strengthening   UBE (Upper Arm Bike) Level 1x  4 minutes   PT present to discuss progress     Modalities   Modalities Electrical Stimulation;Moist Heat     Moist Heat Therapy   Number Minutes Moist Heat 15 Minutes   Moist Heat Location Cervical     Electrical Stimulation   Electrical Stimulation Location Bil upper traps   Electrical Stimulation Action IFC   Electrical Stimulation Parameters 15 minutes   Electrical Stimulation Goals Pain     Manual Therapy   Manual Therapy Soft tissue mobilization;Myofascial release   Manual therapy comments Pt prone   Soft tissue mobilization upper trap, cervical paraspinals, and subpoccipitals     Neck Exercises:  Stretches   Upper Trapezius Stretch 2 reps;20 seconds          Trigger Point Dry Needling - 05/13/16 0940    Consent Given? Yes   Muscles Treated Upper Body Upper trapezius  cervical multifidi   Upper Trapezius Response Twitch reponse elicited;Palpable increased muscle length   SubOccipitals Response --                PT Short Term Goals - 05/11/16 0943      PT SHORT TERM GOAL #2   Title headaches decreased in frequency by 25%   Time 4   Period Weeks   Status On-going     PT SHORT TERM GOAL #3   Title ability to turn her head with pain decreased >/= 25% due to increase tissue mobility   Time 4   Period Weeks   Status On-going           PT Long Term Goals - 05/11/16 0944      PT LONG TERM GOAL #1   Title independent with HEP and understand how to progress herself   Time 12   Period Weeks   Status On-going     PT LONG TERM GOAL #2   Title ability to look upward into a cabinet with  >/= 50% greater ease due to increase mobility and decreased pain.   Time 12   Period Weeks   Status On-going     PT LONG TERM GOAL #4   Title ability to vacuum, clean dishes and cook with >/= 50% greater ease due to increased strength and decreased pain   Time 12   Period Weeks   Status On-going     PT LONG TERM GOAL #5   Title FOTO score </= 45% limitation   Time 12   Period Weeks   Status On-going               Plan - 05/13/16 [id number]    Clinical Impression Statement Pt reports that she had a set-back with fall last week.  Now things are progressing.  Pt with tension and trigger points Lt>Rt upper trap and suboccipitals and demonstrated improved tissue mobility after dry needling today.  Pt will continue to benefit from skilled PT for strength, flexibility, mnual and modalities for pain.   Rehab Potential Good   Clinical Impairments Affecting Rehab Potential Fell on 06/29/2015 resulting with fractured skull, fractured T7, fractured shoulder, had 12 weeks of [facility]   PT Frequency 3x / week   PT Duration 12 weeks   PT Next Visit Plan Postural strengthneing as tolerated, soft tisuue work   Oncologist with Plan of Care Patient      Patient will benefit from skilled therapeutic intervention in order to improve the following deficits and impairments:  Decreased range of motion, Increased fascial restricitons, Decreased endurance, Increased muscle spasms, Pain, Hypomobility, Impaired flexibility, Decreased mobility, Decreased strength  Visit Diagnosis: Pain in thoracic spine  Other muscle spasm  Muscle weakness (generalized)     Problem List Patient Active Problem List   Diagnosis Date Noted  . Wart viral 05/12/2016  . Contusion of knee 05/05/2016  . Fall involving sidewalk curb 05/05/2016  . Aphasia 03/13/2016  . Shoulder pain, bilateral 12/11/2015  . Fibromyalgia 12/11/2015  . Post concussion syndrome 11/01/2015  . Clavicle fracture  10/30/2015  . Hair loss 10/11/2015  . Pain in joint, shoulder region 09/11/2015  . Right wrist pain 08/07/2015  . Splinter in  skin 07/30/2015  . Fall from ladder 07/30/2015  . Skull fracture with concussion ([place]) 07/30/2015  . Fracture of cervical vertebra, C6 ([place]) 07/30/2015  . Thoracic vertebral fracture ([place]) 07/30/2015  . Abdominal pain, epigastric 05/07/2015  . Scaly patch rash 02/26/2015  . Mitral valve regurgitation 09/27/2014  . PAT (paroxysmal atrial tachycardia) ([place]) 09/06/2014  . Tachycardia 07/31/2014  . Sprain of ankle 06/29/2014  . Earache, left 05/15/2013  . Dizziness 05/15/2013  . Asthmatic bronchitis 05/08/2013  . Benign paroxysmal positional vertigo 05/08/2013  . Chest wall pain 01/26/2013  . LUQ abdominal pain 01/20/2013  . Insomnia 01/10/2013  . Depression (emotion) 01/10/2013  . URI, acute 08/25/2012  . Snoring 08/25/2012  . IBS (irritable bowel syndrome) 02/08/2012  . Multiple pulmonary nodules 02/08/2012  . Chest heaviness 11/16/2010  . GERD 05/24/2010  . SINUSITIS, MAXILLARY, CHRONIC 05/20/2010  . Myalgia and myositis 11/20/2009  . Fatigue 11/20/2009  . ARTHRALGIA 06/26/2009  . Cervicogenic headache 06/26/2009  . TOBACCO USE, QUIT 06/26/2009  . LOW BACK PAIN 02/20/2009  . Rosacea 12/27/2008  . PRURITUS 12/27/2008  . Allergic urticaria 12/27/2008  . B12 deficiency 01/02/2008     Sigurd Sos, PT 05/13/16 10:12 AM  [facility] [address] [place], [place], [address] Phone: [phone]   Fax:  [phone]  Name: HARMONI BERTELSEN MRN: [id number] Date of Birth: Aug 22, 1965

## 2016-05-14 ENCOUNTER — Encounter: Payer: BC Managed Care – PPO | Admitting: Speech Pathology

## 2016-05-14 ENCOUNTER — Ambulatory Visit: Payer: BC Managed Care – PPO | Admitting: Obstetrics and Gynecology

## 2016-05-14 ENCOUNTER — Ambulatory Visit (INDEPENDENT_AMBULATORY_CARE_PROVIDER_SITE_OTHER): Payer: BC Managed Care – PPO | Admitting: Psychology

## 2016-05-14 DIAGNOSIS — S060X0S Concussion without loss of consciousness, sequela: Secondary | ICD-10-CM

## 2016-05-14 NOTE — Progress Notes (Signed)
   Neuropsychology Note  Regina [name] returned today for 3 hours of neuropsychological testing with technician, Milana Kidney, BS, under the supervision of Dr. Macarthur Critchley. The patient did not appear overtly distressed by the testing session, per behavioral observation or via self-report to the technician. Rest breaks were offered. Regina [name] will return within 2 weeks for a feedback session with Dr. Si Raider at which time her test performances, clinical impressions and treatment recommendations will be reviewed in detail. The patient understands she can contact our office should she require our assistance before this time.  Full report to follow.

## 2016-05-15 ENCOUNTER — Ambulatory Visit: Payer: BC Managed Care – PPO | Admitting: *Deleted

## 2016-05-18 ENCOUNTER — Encounter: Payer: Self-pay | Admitting: Physical Therapy

## 2016-05-18 ENCOUNTER — Ambulatory Visit: Payer: BC Managed Care – PPO | Admitting: Physical Therapy

## 2016-05-18 DIAGNOSIS — M62838 Other muscle spasm: Secondary | ICD-10-CM

## 2016-05-18 DIAGNOSIS — R41841 Cognitive communication deficit: Secondary | ICD-10-CM

## 2016-05-18 DIAGNOSIS — M6281 Muscle weakness (generalized): Secondary | ICD-10-CM

## 2016-05-18 DIAGNOSIS — G4489 Other headache syndrome: Secondary | ICD-10-CM

## 2016-05-18 DIAGNOSIS — M546 Pain in thoracic spine: Secondary | ICD-10-CM

## 2016-05-18 NOTE — Therapy (Signed)
[facility] [address], [place], [place], [address] Phone: [phone]   Fax:  [phone]  Physical Therapy Treatment  Patient Details  Name: Regina [name] MRN: [id number] Date of Birth: 08-Jul-1965 Referring Provider: Dr. Lew Dawes  Encounter Date: 05/18/2016      PT End of Session - 05/18/16 1021    Visit Number 13   Date for PT Re-Evaluation 06/24/16   PT Start Time 0930   PT Stop Time 1022   PT Time Calculation (min) 52 min   Activity Tolerance Patient tolerated treatment well   Behavior During Therapy [facility] for tasks assessed/performed      Past Medical History:  Diagnosis Date  . Allergic urticaria   . ARTHRALGIA   . Arthritis   . Atrial fibrillation ([place])   . B12 DEFICIENCY   . CFS (chronic fatigue syndrome)   . Endometriosis   . Epidural hemorrhage with loss of consciousness ([place]) 06/29/15  . Fibroid   . Fibromyalgia   . Fracture of cervical vertebra, C6 ([place]) 06/29/15  . Gastritis   . GERD   . Heart murmur   . History of endometriosis   . IBS (irritable bowel syndrome)   . Internal hemorrhoids   . MVP (mitral valve prolapse)   . Osteoarthritis   . Rosacea   . Thoracic compression fracture ([place]) 06/29/15   T2-T6    Past Surgical History:  Procedure Laterality Date  . ABDOMINAL HYSTERECTOMY     partial  . ABDOMINAL HYSTERECTOMY    . ANAL FISSURE REPAIR     Dr Rise [name] 2009  . APPENDECTOMY    . COLPOSCOPY    . HEMORRHOID SURGERY     Dr. Rise [name] 2009  . OVARIAN CYST REMOVAL    . RT BUNIONECTOMT    . TONSILLECTOMY AND ADENOIDECTOMY    . TUBAL LIGATION    . UTERINE FIBROID SURGERY      There were no vitals filed for this visit.      Subjective Assessment - 05/18/16 0934    Subjective Pt reports having more diffulty sleeping due to neck and back pain   Pertinent History fibromyalgia;  had previous PT neck and shoulder   Patient Stated Goals reduce pain, turn neck instead  of body   Currently in Pain? Yes   Pain Score 5    Pain Location Neck   Pain Orientation Left   Pain Descriptors / Indicators Tightness   Pain Type Acute pain   Pain Onset More than a month ago   Pain Frequency Constant                         [facility] Adult PT Treatment/Exercise - 05/18/16 0001      Neck Exercises: Supine   Upper Extremity D2 10 reps;Extension   Theraband Level (UE D2) Level 2 (Red)   Other Supine Exercise Horizontal abduction  on foam roller red x10   Other Supine Exercise lay on foam roll, decompress 2 min; alterante shoulder flexion 10x; squeeze shoulder blades 5 sec 10x; retract head into roll 5 sec 10x;   Shoulder flexion with red band     Modalities   Modalities Electrical Stimulation;Moist Heat     Moist Heat Therapy   Number Minutes Moist Heat 15 Minutes   Moist Heat Location Cervical     Electrical Stimulation   Electrical Stimulation Location Bil upper traps   Electrical Stimulation Action IFC  Electrical Stimulation Parameters 15 minutes   Electrical Stimulation Goals Pain     Manual Therapy   Manual Therapy Soft tissue mobilization;Myofascial release   Manual therapy comments Pt supine   Soft tissue mobilization Suboccipitals, SCM, and cervical muscles; scalenes; lateral pterygoid muscle; platysimus; masseter                  PT Short Term Goals - 05/11/16 0943      PT SHORT TERM GOAL #2   Title headaches decreased in frequency by 25%   Time 4   Period Weeks   Status On-going     PT SHORT TERM GOAL #3   Title ability to turn her head with pain decreased >/= 25% due to increase tissue mobility   Time 4   Period Weeks   Status On-going           PT Long Term Goals - 05/11/16 0944      PT LONG TERM GOAL #1   Title independent with HEP and understand how to progress herself   Time 12   Period Weeks   Status On-going     PT LONG TERM GOAL #2   Title ability to look upward into a cabinet with >/= 50%  greater ease due to increase mobility and decreased pain.   Time 12   Period Weeks   Status On-going     PT LONG TERM GOAL #4   Title ability to vacuum, clean dishes and cook with >/= 50% greater ease due to increased strength and decreased pain   Time 12   Period Weeks   Status On-going     PT LONG TERM GOAL #5   Title FOTO score </= 45% limitation   Time 12   Period Weeks   Status On-going               Plan - 05/18/16 1034    Clinical Impression Statement Pt reports having increased symptoms since falling on curb. Pt did well with al strengthening. Will continue to progress with postural exercises as tolerated. Pt will continue to benefit from skilled therapy for core strength and neck stability and managment of pain symptoms.    Rehab Potential Good   Clinical Impairments Affecting Rehab Potential Fell on 06/29/2015 resulting with fractured skull, fractured T7, fractured shoulder, had 12 weeks of [facility] therapy   PT Frequency 3x / week   PT Duration 12 weeks   PT Treatment/Interventions Cryotherapy;Electrical Stimulation;Ultrasound;Traction;Moist Heat;Therapeutic activities;Therapeutic exercise;Neuromuscular re-education;Patient/family education;Passive range of motion;Manual techniques;Dry needling;Energy conservation;Taping   PT Next Visit Plan Postural strengthneing as tolerated, soft tisuue work   Oncologist with Plan of Care Patient      Patient will benefit from skilled therapeutic intervention in order to improve the following deficits and impairments:  Decreased range of motion, Increased fascial restricitons, Decreased endurance, Increased muscle spasms, Pain, Hypomobility, Impaired flexibility, Decreased mobility, Decreased strength  Visit Diagnosis: Pain in thoracic spine  Other muscle spasm  Muscle weakness (generalized)  Cognitive communication deficit  Other headache syndrome     Problem List Patient Active Problem List    Diagnosis Date Noted  . Wart viral 05/12/2016  . Contusion of knee 05/05/2016  . Fall involving sidewalk curb 05/05/2016  . Aphasia 03/13/2016  . Shoulder pain, bilateral 12/11/2015  . Fibromyalgia 12/11/2015  . Post concussion syndrome 11/01/2015  . Clavicle fracture 10/30/2015  . Hair loss 10/11/2015  . Pain in joint, shoulder region 09/11/2015  . Right  wrist pain 08/07/2015  . Splinter in skin 07/30/2015  . Fall from ladder 07/30/2015  . Skull fracture with concussion ([place]) 07/30/2015  . Fracture of cervical vertebra, C6 ([place]) 07/30/2015  . Thoracic vertebral fracture ([place]) 07/30/2015  . Abdominal pain, epigastric 05/07/2015  . Scaly patch rash 02/26/2015  . Mitral valve regurgitation 09/27/2014  . PAT (paroxysmal atrial tachycardia) ([place]) 09/06/2014  . Tachycardia 07/31/2014  . Sprain of ankle 06/29/2014  . Earache, left 05/15/2013  . Dizziness 05/15/2013  . Asthmatic bronchitis 05/08/2013  . Benign paroxysmal positional vertigo 05/08/2013  . Chest wall pain 01/26/2013  . LUQ abdominal pain 01/20/2013  . Insomnia 01/10/2013  . Depression (emotion) 01/10/2013  . URI, acute 08/25/2012  . Snoring 08/25/2012  . IBS (irritable bowel syndrome) 02/08/2012  . Multiple pulmonary nodules 02/08/2012  . Chest heaviness 11/16/2010  . GERD 05/24/2010  . SINUSITIS, MAXILLARY, CHRONIC 05/20/2010  . Myalgia and myositis 11/20/2009  . Fatigue 11/20/2009  . ARTHRALGIA 06/26/2009  . Cervicogenic headache 06/26/2009  . TOBACCO USE, QUIT 06/26/2009  . LOW BACK PAIN 02/20/2009  . Rosacea 12/27/2008  . PRURITUS 12/27/2008  . Allergic urticaria 12/27/2008  . B12 deficiency 01/02/2008    Mikle Bosworth PTA 05/18/2016, 10:40 AM  [facility] [address], [place], [place], [address] Phone: [phone]   Fax:  [phone]  Name: Regina [name] MRN: [id number] Date of Birth: [birth year]

## 2016-05-19 ENCOUNTER — Ambulatory Visit: Payer: BC Managed Care – PPO | Admitting: Speech Pathology

## 2016-05-19 DIAGNOSIS — R41841 Cognitive communication deficit: Secondary | ICD-10-CM | POA: Diagnosis not present

## 2016-05-19 NOTE — Patient Instructions (Addendum)
  Make a daily schedule/to do list with times  Schedule times for chores and TV to limit TV  Be aware of lack of motivation - try to fight through it  Do more complex tasks when you are most alert/focused  Do language tasks daily - cross words, word games,

## 2016-05-19 NOTE — Therapy (Signed)
[facility] [address] [place], [place], [address] Phone: [phone]   Fax:  [phone]  Speech Language Pathology Treatment  Patient Details  Name: Regina [name] MRN: [id number] Date of Birth: 17-Apr-1965 Referring Provider: Dr. Faith Rogue  Encounter Date: 05/19/2016      End of Session - 05/19/16 1222    Visit Number 9   Number of Visits 13   Date for SLP Re-Evaluation 05/19/16   SLP Start Time 0944   SLP Stop Time  1020   SLP Time Calculation (min) 36 min      Past Medical History:  Diagnosis Date  . Allergic urticaria   . ARTHRALGIA   . Arthritis   . Atrial fibrillation ([facility])   . B12 DEFICIENCY   . CFS (chronic fatigue syndrome)   . Endometriosis   . Epidural hemorrhage with loss of consciousness ([facility]) 06/29/15  . Fibroid   . Fibromyalgia   . Fracture of cervical vertebra, C6 ([facility]) 06/29/15  . Gastritis   . GERD   . Heart murmur   . History of endometriosis   . IBS (irritable bowel syndrome)   . Internal hemorrhoids   . MVP (mitral valve prolapse)   . Osteoarthritis   . Rosacea   . Thoracic compression fracture ([facility]) 06/29/15   T2-T6    Past Surgical History:  Procedure Laterality Date  . ABDOMINAL HYSTERECTOMY     partial  . ABDOMINAL HYSTERECTOMY    . ANAL FISSURE REPAIR     Dr Zachery Dakins 2009  . APPENDECTOMY    . COLPOSCOPY    . HEMORRHOID SURGERY     Dr. Zachery Dakins 2009  . OVARIAN CYST REMOVAL    . RT BUNIONECTOMT    . TONSILLECTOMY AND ADENOIDECTOMY    . TUBAL LIGATION    . UTERINE FIBROID SURGERY      There were no vitals filed for this visit.      Subjective Assessment - 05/19/16 0944    Subjective "My house is a mess - but I got 4 of my 5 Christmas trees down" Pt arrived 12 min late               ADULT SLP TREATMENT - 05/19/16 0945      General Information   Behavior/Cognition Alert;Cooperative;Pleasant mood     Treatment Provided   Treatment provided  Cognitive-Linquistic     Pain Assessment   Pain Assessment 0-10   Pain Score 5    Pain Location neck and cape   Pain Descriptors / Indicators Sore   Pain Intervention(s) Monitored during session     Cognitive-Linquistic Treatment   Treatment focused on Cognition;Aphasia;Patient/family/caregiver education   Skilled Treatment Pt arrived late to session. Pt reports difficulty being motivated to complete tasks - instructed pt to use daily calendar and schedule times to do chores and limit TV. Divided attention between organization of moderately complex information into a chart, add numbers in chart and particpate in conversation. Pt with errors in adding and locating errors. Frustration noted.      Assessment / Recommendations / Plan   Plan Continue with current plan of care     Progression Toward Goals   Progression toward goals Progressing toward goals              SLP Long Term Goals - 05/19/16 1222      SLP LONG TERM GOAL #1   Title Pt will utilize external aids to manage schedule, lists, recurring  and non recurring tasks over 3 sessions with mod I   Baseline 1/25, 2/1, 2/6, 2/8   Status Achieved     SLP LONG TERM GOAL #2   Title Pt will divide attention between 2 complex cognitive linguistic tasks with 95% on each and supervision cues.    Time 1   Period Weeks   Status On-going     SLP LONG TERM GOAL #3   Title Pt will complete complex abstract naming tasks with 95% accuracy and mod I   Time 1   Period Weeks   Status On-going     SLP LONG TERM GOAL #4   Title Pt will utilize compensations for word finding episodes with mod I during complex conversations over 2 sessions.   Status Achieved          Plan - 05/19/16 1220    Clinical Impression Statement Trained pt in use of daily timed schedule to facilitate completion of chores/tasks due to reduced motivation/organization. Divided attention today required occasional min to mod A, 80% accuracy . Await results of  neuropsych testing. Continue skilled ST to maximize high level cognition for possible return to work   Speech Therapy Frequency 2x / week   Treatment/Interventions Compensatory strategies;Patient/family education;Functional tasks;Cueing hierarchy;Cognitive reorganization;SLP instruction and feedback;Internal/external aids;Language facilitation;Environmental controls   Potential to Achieve Goals Good   Potential Considerations Ability to learn/carryover information;Family/community support;Previous level of function;Cooperation/participation level   Consulted and Agree with Plan of Care Patient      Patient will benefit from skilled therapeutic intervention in order to improve the following deficits and impairments:   Cognitive communication deficit    Problem List Patient Active Problem List   Diagnosis Date Noted  . Wart viral 05/12/2016  . Contusion of knee 05/05/2016  . Fall involving sidewalk curb 05/05/2016  . Aphasia 03/13/2016  . Shoulder pain, bilateral 12/11/2015  . Fibromyalgia 12/11/2015  . Post concussion syndrome 11/01/2015  . Clavicle fracture 10/30/2015  . Hair loss 10/11/2015  . Pain in joint, shoulder region 09/11/2015  . Right wrist pain 08/07/2015  . Splinter in skin 07/30/2015  . Fall from ladder 07/30/2015  . Skull fracture with concussion ([facility]) 07/30/2015  . Fracture of cervical vertebra, C6 ([facility]) 07/30/2015  . Thoracic vertebral fracture ([facility]) 07/30/2015  . Abdominal pain, epigastric 05/07/2015  . Scaly patch rash 02/26/2015  . Mitral valve regurgitation 09/27/2014  . PAT (paroxysmal atrial tachycardia) ([facility]) 09/06/2014  . Tachycardia 07/31/2014  . Sprain of ankle 06/29/2014  . Earache, left 05/15/2013  . Dizziness 05/15/2013  . Asthmatic bronchitis 05/08/2013  . Benign paroxysmal positional vertigo 05/08/2013  . Chest wall pain 01/26/2013  . LUQ abdominal pain 01/20/2013  . Insomnia 01/10/2013  . Depression (emotion) 01/10/2013  . URI, acute  08/25/2012  . Snoring 08/25/2012  . IBS (irritable bowel syndrome) 02/08/2012  . Multiple pulmonary nodules 02/08/2012  . Chest heaviness 11/16/2010  . GERD 05/24/2010  . SINUSITIS, MAXILLARY, CHRONIC 05/20/2010  . Myalgia and myositis 11/20/2009  . Fatigue 11/20/2009  . ARTHRALGIA 06/26/2009  . Cervicogenic headache 06/26/2009  . TOBACCO USE, QUIT 06/26/2009  . LOW BACK PAIN 02/20/2009  . Rosacea 12/27/2008  . PRURITUS 12/27/2008  . Allergic urticaria 12/27/2008  . B12 deficiency 01/02/2008    Onalee Steinbach, Radene Journey MS, CCC-SLP 05/19/2016, 12:24 PM  [facility] [address] [place], [place], [address] Phone: [phone]   Fax:  [phone]   Name: Regina [name] MRN: [id number] Date of Birth:  25-Apr-1965

## 2016-05-20 ENCOUNTER — Ambulatory Visit: Payer: BC Managed Care – PPO

## 2016-05-20 DIAGNOSIS — M546 Pain in thoracic spine: Secondary | ICD-10-CM | POA: Diagnosis not present

## 2016-05-20 DIAGNOSIS — M62838 Other muscle spasm: Secondary | ICD-10-CM

## 2016-05-20 DIAGNOSIS — M6281 Muscle weakness (generalized): Secondary | ICD-10-CM

## 2016-05-20 NOTE — Therapy (Signed)
[facility] [address], [place], [place], [address] Phone: [phone]   Fax:  [phone]  Physical Therapy Treatment  Patient Details  Name: Regina [name] MRN: [id number] Date of Birth: 1965-10-22 Referring Provider: Dr. Lew Dawes  Encounter Date: 05/20/2016      PT End of Session - 05/20/16 1016    Visit Number 14   Date for PT Re-Evaluation 06/24/16   PT Start Time 0930   PT Stop Time 1030   PT Time Calculation (min) 60 min   Activity Tolerance Patient tolerated treatment well   Behavior During Therapy [facility] for tasks assessed/performed      Past Medical History:  Diagnosis Date  . Allergic urticaria   . ARTHRALGIA   . Arthritis   . Atrial fibrillation ([place])   . B12 DEFICIENCY   . CFS (chronic fatigue syndrome)   . Endometriosis   . Epidural hemorrhage with loss of consciousness ([place]) 06/29/15  . Fibroid   . Fibromyalgia   . Fracture of cervical vertebra, C6 ([place]) 06/29/15  . Gastritis   . GERD   . Heart murmur   . History of endometriosis   . IBS (irritable bowel syndrome)   . Internal hemorrhoids   . MVP (mitral valve prolapse)   . Osteoarthritis   . Rosacea   . Thoracic compression fracture ([place]) 06/29/15   T2-T6    Past Surgical History:  Procedure Laterality Date  . ABDOMINAL HYSTERECTOMY     partial  . ABDOMINAL HYSTERECTOMY    . ANAL FISSURE REPAIR     Dr Rise [name] 2009  . APPENDECTOMY    . COLPOSCOPY    . HEMORRHOID SURGERY     Dr. Rise [name] 2009  . OVARIAN CYST REMOVAL    . RT BUNIONECTOMT    . TONSILLECTOMY AND ADENOIDECTOMY    . TUBAL LIGATION    . UTERINE FIBROID SURGERY      There were no vitals filed for this visit.      Subjective Assessment - 05/20/16 0937    Subjective I have been more sore.  Went out of town and slept without my pillow.     Patient Stated Goals reduce pain, turn neck instead of body   Currently in Pain? Yes   Pain Score 5    Pain  Location Neck   Pain Orientation Left   Pain Descriptors / Indicators Tightness   Pain Type Chronic pain   Pain Onset More than a month ago   Pain Frequency Constant   Aggravating Factors  turning head, sleep   Pain Relieving Factors rest, heat                         [facility] Adult PT Treatment/Exercise - 05/20/16 0001      Neck Exercises: Machines for Strengthening   UBE (Upper Arm Bike) Level 1 x 6 minutes (3/3)     Neck Exercises: Seated   Shoulder Flexion 20 reps;Both   Shoulder ABduction 20 reps;Both   Shoulder Abduction Limitations scaption 2x10 bil.       Neck Exercises: Supine   Upper Extremity D2 10 reps;Extension   Theraband Level (UE D2) Level 2 (Red)   Other Supine Exercise Horizontal abduction  on foam roller red x10   Other Supine Exercise lay on foam roll, decompress 2 min; alterante shoulder flexion 10x; squeeze shoulder blades 5 sec 10x; retract head into roll 5 sec 10x;   Shoulder  flexion with red band     Modalities   Modalities Electrical Stimulation;Moist Heat     Moist Heat Therapy   Number Minutes Moist Heat 15 Minutes   Moist Heat Location Cervical     Electrical Stimulation   Electrical Stimulation Location bil neck/upper trap   Electrical Stimulation Action IFC   Electrical Stimulation Parameters 15 minutes   Electrical Stimulation Goals Pain     Manual Therapy   Manual Therapy Myofascial release;Soft tissue mobilization   Manual therapy comments --   Soft tissue mobilization Suboccipitals, SCM, and cervical muscles; scalenes; lateral pterygoid muscle; platysimus; masseter     Neck Exercises: Stretches   Upper Trapezius Stretch 2 reps;20 seconds                  PT Short Term Goals - 05/20/16 0939      PT SHORT TERM GOAL #2   Title headaches decreased in frequency by 25%   Status Achieved     PT SHORT TERM GOAL #3   Title ability to turn her head with pain decreased >/= 25% due to increase tissue mobility    Time 4   Period Weeks   Status On-going           PT Long Term Goals - 05/11/16 0944      PT LONG TERM GOAL #1   Title independent with HEP and understand how to progress herself   Time 12   Period Weeks   Status On-going     PT LONG TERM GOAL #2   Title ability to look upward into a cabinet with >/= 50% greater ease due to increase mobility and decreased pain.   Time 12   Period Weeks   Status On-going     PT LONG TERM GOAL #4   Title ability to vacuum, clean dishes and cook with >/= 50% greater ease due to increased strength and decreased pain   Time 12   Period Weeks   Status On-going     PT LONG TERM GOAL #5   Title FOTO score </= 45% limitation   Time 12   Period Weeks   Status On-going               Plan - 05/20/16 [id number]    Clinical Impression Statement Pt with increased neck pain after sleeping without her pillow in a different bed this weekend.  Pt reports 25% reduction in headache frequency and intensity and 10% reduction in neck pain.  Pt able to tolerate postural and core stability exercises today.  Pt responds well to soft tissue work/mobilization and modalities for pain.  Pt will continue to benefit from skilled PT for for manual, postural strength, flexiblity and modalities.     Rehab Potential Good   Clinical Impairments Affecting Rehab Potential Fell on 06/29/2015 resulting with fractured skull, fractured T7, fractured shoulder, had 12 weeks of [facility] Physical therapy   PT Frequency 3x / week   PT Duration 12 weeks   PT Treatment/Interventions Cryotherapy;Electrical Stimulation;Ultrasound;Traction;Moist Heat;Therapeutic activities;Therapeutic exercise;Neuromuscular re-education;Patient/family education;Passive range of motion;Manual techniques;Dry needling;Energy conservation;Taping   PT Next Visit Plan Postural strengthneing as tolerated, soft tisuue work.  Dry needling next week.     Consulted and Agree with Plan of Care Patient      Patient  will benefit from skilled therapeutic intervention in order to improve the following deficits and impairments:  Decreased range of motion, Increased fascial restricitons, Decreased endurance, Increased muscle spasms, Pain, Hypomobility, Impaired flexibility,  Decreased mobility, Decreased strength  Visit Diagnosis: Pain in thoracic spine  Other muscle spasm  Muscle weakness (generalized)     Problem List Patient Active Problem List   Diagnosis Date Noted  . Wart viral 05/12/2016  . Contusion of knee 05/05/2016  . Fall involving sidewalk curb 05/05/2016  . Aphasia 03/13/2016  . Shoulder pain, bilateral 12/11/2015  . Fibromyalgia 12/11/2015  . Post concussion syndrome 11/01/2015  . Clavicle fracture 10/30/2015  . Hair loss 10/11/2015  . Pain in joint, shoulder region 09/11/2015  . Right wrist pain 08/07/2015  . Splinter in skin 07/30/2015  . Fall from ladder 07/30/2015  . Skull fracture with concussion ([place]) 07/30/2015  . Fracture of cervical vertebra, C6 ([place]) 07/30/2015  . Thoracic vertebral fracture ([place]) 07/30/2015  . Abdominal pain, epigastric 05/07/2015  . Scaly patch rash 02/26/2015  . Mitral valve regurgitation 09/27/2014  . PAT (paroxysmal atrial tachycardia) ([place]) 09/06/2014  . Tachycardia 07/31/2014  . Sprain of ankle 06/29/2014  . Earache, left 05/15/2013  . Dizziness 05/15/2013  . Asthmatic bronchitis 05/08/2013  . Benign paroxysmal positional vertigo 05/08/2013  . Chest wall pain 01/26/2013  . LUQ abdominal pain 01/20/2013  . Insomnia 01/10/2013  . Depression (emotion) 01/10/2013  . URI, acute 08/25/2012  . Snoring 08/25/2012  . IBS (irritable bowel syndrome) 02/08/2012  . Multiple pulmonary nodules 02/08/2012  . Chest heaviness 11/16/2010  . GERD 05/24/2010  . SINUSITIS, MAXILLARY, CHRONIC 05/20/2010  . Myalgia and myositis 11/20/2009  . Fatigue 11/20/2009  . ARTHRALGIA 06/26/2009  . Cervicogenic headache 06/26/2009  . TOBACCO USE, QUIT 06/26/2009   . LOW BACK PAIN 02/20/2009  . Rosacea 12/27/2008  . PRURITUS 12/27/2008  . Allergic urticaria 12/27/2008  . B12 deficiency 01/02/2008     Sigurd Sos, PT 05/20/16 10:17 AM  [facility] [address], [place], [place], [address] Phone: [phone]   Fax:  [phone]  Name: ISOLENE FORNAL MRN: [id number] Date of Birth: [birth year]

## 2016-05-21 ENCOUNTER — Ambulatory Visit: Payer: Self-pay

## 2016-05-21 ENCOUNTER — Encounter: Payer: Self-pay | Admitting: Sports Medicine

## 2016-05-21 ENCOUNTER — Ambulatory Visit: Payer: BC Managed Care – PPO | Admitting: Speech Pathology

## 2016-05-21 ENCOUNTER — Ambulatory Visit (INDEPENDENT_AMBULATORY_CARE_PROVIDER_SITE_OTHER): Payer: BC Managed Care – PPO | Admitting: Sports Medicine

## 2016-05-21 VITALS — BP 110/78 | HR 67 | Ht 64.0 in | Wt 170.0 lb

## 2016-05-21 DIAGNOSIS — S76319S Strain of muscle, fascia and tendon of the posterior muscle group at thigh level, unspecified thigh, sequela: Secondary | ICD-10-CM | POA: Insufficient documentation

## 2016-05-21 DIAGNOSIS — M25561 Pain in right knee: Secondary | ICD-10-CM | POA: Insufficient documentation

## 2016-05-21 DIAGNOSIS — G8929 Other chronic pain: Secondary | ICD-10-CM | POA: Diagnosis not present

## 2016-05-21 DIAGNOSIS — R41841 Cognitive communication deficit: Secondary | ICD-10-CM

## 2016-05-21 NOTE — Assessment & Plan Note (Signed)
She does have multifactorial knee pain but I believe the most focal issue is coming from the prior hamstring injury that was incompletely rehabbed.  Discussed the foundation of treatment for this condition is physical therapy and daily (5-6 days/week) therapeutic exercises, focusing on core strengthening, coordination, neuromuscular control/reeducation.  Therapeutic exercises reviewed: Quadricep and hamstring range of motion, strengthening

## 2016-05-21 NOTE — Patient Instructions (Addendum)
Please perform the exercise program that Jeneen Rinks has prepared for you and gone over in detail on a daily basis.  In addition to the handout you were provided you can access your program through: [email or web address]   Your unique program code is: [facility]

## 2016-05-21 NOTE — Assessment & Plan Note (Signed)
She does have small amount of degenerative change within the medial and lateral meniscus.  Small focal osteochondral lesion within the medial femoral condyle.  I suspect this is contributing to her underlying discomfort and would consider intra-articular injection if any persistent symptoms.  She would like to defer this at this time.

## 2016-05-21 NOTE — Therapy (Signed)
[facility] [address] [place], [place], [address] Phone: [phone]   Fax:  [phone]  Speech Language Pathology Treatment  Patient Details  Name: Regina [name] MRN: [id number] Date of Birth: [birth year] Referring Provider: Dr. Faith Rogue  Encounter Date: 05/21/2016      End of Session - 05/21/16 1021    Visit Number 10   Number of Visits 13   Date for SLP Re-Evaluation 06/11/16   SLP Start Time 0939   SLP Stop Time  1015   SLP Time Calculation (min) 36 min      Past Medical History:  Diagnosis Date  . Allergic urticaria   . ARTHRALGIA   . Arthritis   . Atrial fibrillation (HCC)   . B12 DEFICIENCY   . CFS (chronic fatigue syndrome)   . Endometriosis   . Epidural hemorrhage with loss of consciousness (HCC) 06/29/15  . Fibroid   . Fibromyalgia   . Fracture of cervical vertebra, C6 (HCC) 06/29/15  . Gastritis   . GERD   . Heart murmur   . History of endometriosis   . IBS (irritable bowel syndrome)   . Internal hemorrhoids   . MVP (mitral valve prolapse)   . Osteoarthritis   . Rosacea   . Thoracic compression fracture (HCC) 06/29/15   T2-T6    Past Surgical History:  Procedure Laterality Date  . ABDOMINAL HYSTERECTOMY     partial  . ABDOMINAL HYSTERECTOMY    . ANAL FISSURE REPAIR     Dr Zachery Dakins 2009  . APPENDECTOMY    . COLPOSCOPY    . HEMORRHOID SURGERY     Dr. Zachery Dakins 2009  . OVARIAN CYST REMOVAL    . RT BUNIONECTOMT    . TONSILLECTOMY AND ADENOIDECTOMY    . TUBAL LIGATION    . UTERINE FIBROID SURGERY      There were no vitals filed for this visit.      Subjective Assessment - 05/21/16 0940    Subjective "I'm having a fibromyalgia day, so I'm not happy"               ADULT SLP TREATMENT - 05/21/16 0941      General Information   Behavior/Cognition Alert;Cooperative;Pleasant mood     Treatment Provided   Treatment provided Cognitive-Linquistic     Pain  Assessment   Pain Assessment 0-10   Pain Score 6    Pain Location neck and cape   Pain Descriptors / Indicators Aching;Contraction;Discomfort   Pain Intervention(s) Monitored during session     Cognitive-Linquistic Treatment   Treatment focused on Cognition;Aphasia;Patient/family/caregiver education   Skilled Treatment Pt arrived 9 minutes late for session. Seeing physician for pain after session. Complex divergent naming in complex categories with mod I.  Complex verbal expression facilitated by  defining high level low frequency words from context with rare min A. Divided attention between memory game and conversation with 95% on memory game.      Assessment / Recommendations / Plan   Plan Continue with current plan of care     Progression Toward Goals   Progression toward goals Progressing toward goals              SLP Long Term Goals - 05/21/16 1021      SLP LONG TERM GOAL #1   Title Pt will utilize external aids to manage schedule, lists, recurring and non recurring tasks over 3 sessions with mod I   Baseline 1/25, 2/1, 2/6, 2/8  Status Achieved     SLP LONG TERM GOAL #2   Title Pt will divide attention between 2 complex cognitive linguistic tasks with 95% on each and supervision cues.    Time 1   Period Weeks   Status On-going     SLP LONG TERM GOAL #3   Title Pt will complete complex abstract naming tasks with 95% accuracy and mod I   Time 1   Period Weeks   Status On-going     SLP LONG TERM GOAL #4   Title Pt will utilize compensations for word finding episodes with mod I during complex conversations over 2 sessions.   Status Achieved          Plan - 05/21/16 1019    Clinical Impression Statement Pt did not initiate daily timed schedule due to fibromyalgia flare up. Divided attention on mildly complex tasks with rare min A and 95%. Pt reports she is using timers, but she is not always attended to them and missing some tasks. Continue skilled ST to  maximize cognition for possible return to work.   Speech Therapy Frequency 2x / week   Treatment/Interventions Compensatory strategies;Patient/family education;Functional tasks;Cueing hierarchy;Cognitive reorganization;SLP instruction and feedback;Internal/external aids;Language facilitation;Environmental controls   Potential to Achieve Goals Good   Potential Considerations Ability to learn/carryover information;Family/community support;Previous level of function;Cooperation/participation level   Consulted and Agree with Plan of Care Patient      Patient will benefit from skilled therapeutic intervention in order to improve the following deficits and impairments:   Cognitive communication deficit    Problem List Patient Active Problem List   Diagnosis Date Noted  . Wart viral 05/12/2016  . Contusion of knee 05/05/2016  . Fall involving sidewalk curb 05/05/2016  . Aphasia 03/13/2016  . Shoulder pain, bilateral 12/11/2015  . Fibromyalgia 12/11/2015  . Post concussion syndrome 11/01/2015  . Clavicle fracture 10/30/2015  . Hair loss 10/11/2015  . Pain in joint, shoulder region 09/11/2015  . Right wrist pain 08/07/2015  . Splinter in skin 07/30/2015  . Fall from ladder 07/30/2015  . Skull fracture with concussion (HCC) 07/30/2015  . Fracture of cervical vertebra, C6 (HCC) 07/30/2015  . Thoracic vertebral fracture (HCC) 07/30/2015  . Abdominal pain, epigastric 05/07/2015  . Scaly patch rash 02/26/2015  . Mitral valve regurgitation 09/27/2014  . PAT (paroxysmal atrial tachycardia) (HCC) 09/06/2014  . Tachycardia 07/31/2014  . Sprain of ankle 06/29/2014  . Earache, left 05/15/2013  . Dizziness 05/15/2013  . Asthmatic bronchitis 05/08/2013  . Benign paroxysmal positional vertigo 05/08/2013  . Chest wall pain 01/26/2013  . LUQ abdominal pain 01/20/2013  . Insomnia 01/10/2013  . Depression (emotion) 01/10/2013  . URI, acute 08/25/2012  . Snoring 08/25/2012  . IBS (irritable  bowel syndrome) 02/08/2012  . Multiple pulmonary nodules 02/08/2012  . Chest heaviness 11/16/2010  . GERD 05/24/2010  . SINUSITIS, MAXILLARY, CHRONIC 05/20/2010  . Myalgia and myositis 11/20/2009  . Fatigue 11/20/2009  . ARTHRALGIA 06/26/2009  . Cervicogenic headache 06/26/2009  . TOBACCO USE, QUIT 06/26/2009  . LOW BACK PAIN 02/20/2009  . Rosacea 12/27/2008  . PRURITUS 12/27/2008  . Allergic urticaria 12/27/2008  . B12 deficiency 01/02/2008    Lavayah Vita, Radene Journey  MS, CCC-SLP 05/21/2016, 10:22 AM  [facility] [address] [place], [place], [address] Phone: [phone]   Fax:  [phone]   Name: Regina [name] MRN: [id number] Date of Birth: 1965-06-27

## 2016-05-21 NOTE — Progress Notes (Signed)
Regina [name] - 51 y.o. female MRN [id number]  Date of birth: 01-Jan-1966  Office Visit Note: Visit Date: 05/21/2016 PCP: Walker Kehr, MD Referred by: Cassandria Anger, MD  Subjective: Chief Complaint  Patient presents with  . pain in right knee and thigh    Pt c/o pain in right knee and thigh since accident June 29 2015. She had a bruise behind her right knee for 4 mos. Pain has gotten worse since this past Saturday.    HPI: Patient presents for evaluation of right posterior knee pain that has been present since last April when she sustained a fall from a ladder in resulted in multitrauma hospitalization.  She is continuing to have a prolonged recovery from the underlying injuries that she sustained.  She has  been addressing the other issues primarily but did notice bruising and pain in the posterior aspect of her leg and knee for 4 months immediately following the injury but this was minimal in nature and not affecting her quality of life at that time.  She has noticed however since tripping over a curb several weeks ago that she has had a return of a small amount of discomfort within the posterior leg and knee.  She denies any significant swelling.  She is concerned for potential blood clot.  No family history of DVT or personal history of DVT. Denies any catching, locking or giving way of the knee.  She does have generalized medial and lateral joint pain but primarily reports posterior pain is the largest issue. ROS:  Otherwise per HPI.  Objective:  VS:  HT:5\' 4"  (162.6 cm)   WT:170 lb (77.1 kg)  BMI:29.2    BP:110/78  HR:67bpm  TEMP: ( )  RESP:97 % Physical Exam: GENERAL: WDWN, NAD, Non-toxic appearing  PSYCH: Alert & Appropriately interactive Not depressed or anxious appearing   Right knee: She does have a small well-healed superficial abrasion over the anterior aspect of the knee.  Overall leg is well aligned.  She has general discomfort with palpation of the popliteal  fossa but no swelling.  Pain is focally located over the distal third of the lateral hamstrings.  There is no deformity appreciated.  Strength is intact however painful with resisted hamstring testing.  Lower extremity sensation is intact.  No lower extremity swelling.  Her knee is ligamentously stable but she does have a small amount of pain along the medial and lateral joint line.  She has pain with McMurray's testing, no reproducible mechanical click.  Imaging & Procedures: No results found.  LIMITED MSK ULTRAS2OUND of: Right knee INFO: Images were obtained and interpreted by myself, Teresa Coombs, DO  Images have been saved and stored to PACS system. Images obtained on: GE S7 Ultrasound machine  FINDINGS: Patella & Patellar Tendon: Small amount of hypoechoic change both the origin and insertion of the patellar tendon without neovascularity.  She has no pain with sonopalpation. Quad & Quad Tendon: Normal Suprapatellar Pouch: Normal, no effusion Medial Joint Line: Bulging and fraying of the meniscus Lateral Joint Line: Bulging and swelling of the lateral meniscus  Trochlea: Overall only very small area of focal cartilage loss along the medial femoral condyle. Posterior knee: Easily compressible popliteal vein.  Very small Baker's cyst.  Posterior musculature does reveal some changes in the distal third of the lateral hamstrings consistent with a prior injury.  No increased neovascularity.   IMPRESSION: The above findings are consistent with prior hamstring injury with underlying minimal but likely significant  degenerative change.     PROCEDURE NOTE: THERAPEUTIC EXERCISES (97110) 15 minutes spent for Therapeutic exercises as stated in above notes.  This included exercises focusing on stretching, strengthening, with significant focus on eccentric aspects.   Proper technique shown and discussed handout in great detail with Kristine Royal, ATC.  All questions were discussed and answered.     Assessment & Plan: Problem List Items Addressed This Visit    Chronic pain of right knee - Primary    She does have small amount of degenerative change within the medial and lateral meniscus.  Small focal osteochondral lesion within the medial femoral condyle.  I suspect this is contributing to her underlying discomfort and would consider intra-articular injection if any persistent symptoms.  She would like to defer this at this time.      Relevant Orders   [place] LIMITED JOINT SPACE STRUCTURES LOW RIGHT   Hamstring strain, sequela    She does have multifactorial knee pain but I believe the most focal issue is coming from the prior hamstring injury that was incompletely rehabbed.  Discussed the foundation of treatment for this condition is physical therapy and daily (5-6 days/week) therapeutic exercises, focusing on core strengthening, coordination, neuromuscular control/reeducation.  Therapeutic exercises reviewed: Quadricep and hamstring range of motion, strengthening          Follow-up: Return if symptoms worsen or fail to improve.   Past Medical/Family/Surgical/Social History: Medications & Allergies reviewed per EMR Patient Active Problem List   Diagnosis Date Noted  . Chronic pain of right knee 05/21/2016  . Hamstring strain, sequela 05/21/2016  . Wart viral 05/12/2016  . Contusion of knee 05/05/2016  . Fall involving sidewalk curb 05/05/2016  . Aphasia 03/13/2016  . Shoulder pain, bilateral 12/11/2015  . Fibromyalgia 12/11/2015  . Post concussion syndrome 11/01/2015  . Clavicle fracture 10/30/2015  . Hair loss 10/11/2015  . Pain in joint, shoulder region 09/11/2015  . Right wrist pain 08/07/2015  . Splinter in skin 07/30/2015  . Fall from ladder 07/30/2015  . Skull fracture with concussion ([place]) 07/30/2015  . Fracture of cervical vertebra, C6 ([place]) 07/30/2015  . Thoracic vertebral fracture ([place]) 07/30/2015  . Abdominal pain, epigastric 05/07/2015  . Scaly patch rash  02/26/2015  . Mitral valve regurgitation 09/27/2014  . PAT (paroxysmal atrial tachycardia) ([place]) 09/06/2014  . Tachycardia 07/31/2014  . Sprain of ankle 06/29/2014  . Earache, left 05/15/2013  . Dizziness 05/15/2013  . Asthmatic bronchitis 05/08/2013  . Benign paroxysmal positional vertigo 05/08/2013  . Chest wall pain 01/26/2013  . LUQ abdominal pain 01/20/2013  . Insomnia 01/10/2013  . Depression (emotion) 01/10/2013  . URI, acute 08/25/2012  . Snoring 08/25/2012  . IBS (irritable bowel syndrome) 02/08/2012  . Multiple pulmonary nodules 02/08/2012  . Chest heaviness 11/16/2010  . GERD 05/24/2010  . SINUSITIS, MAXILLARY, CHRONIC 05/20/2010  . Myalgia and myositis 11/20/2009  . Fatigue 11/20/2009  . ARTHRALGIA 06/26/2009  . Cervicogenic headache 06/26/2009  . TOBACCO USE, QUIT 06/26/2009  . LOW BACK PAIN 02/20/2009  . Rosacea 12/27/2008  . PRURITUS 12/27/2008  . Allergic urticaria 12/27/2008  . B12 deficiency 01/02/2008   Past Medical History:  Diagnosis Date  . Allergic urticaria   . ARTHRALGIA   . Arthritis   . Atrial fibrillation ([place])   . B12 DEFICIENCY   . CFS (chronic fatigue syndrome)   . Endometriosis   . Epidural hemorrhage with loss of consciousness ([place]) 06/29/15  . Fibroid   . Fibromyalgia   . Fracture  of cervical vertebra, C6 ([place]) 06/29/15  . Gastritis   . GERD   . Heart murmur   . History of endometriosis   . IBS (irritable bowel syndrome)   . Internal hemorrhoids   . MVP (mitral valve prolapse)   . Osteoarthritis   . Rosacea   . Thoracic compression fracture ([place]) 06/29/15   T2-T6   Family History  Problem Relation Age of Onset  . Lung cancer Mother   . Thyroid cancer Mother   . Cancer Mother     INTESTINAL  . Healthy Father   . Diabetes      uncles/aunts  . Von Willebrand disease Daughter   . Asthma Neg Hx    Past Surgical History:  Procedure Laterality Date  . ABDOMINAL HYSTERECTOMY     partial  . ABDOMINAL HYSTERECTOMY    . ANAL  FISSURE REPAIR     Dr Rise [name] 2009  . APPENDECTOMY    . COLPOSCOPY    . HEMORRHOID SURGERY     Dr. Rise [name] 2009  . OVARIAN CYST REMOVAL    . RT BUNIONECTOMT    . TONSILLECTOMY AND ADENOIDECTOMY    . TUBAL LIGATION    . UTERINE FIBROID SURGERY     Social History   Occupational History  . Not on file.   Social History Main Topics  . Smoking status: Former Smoker    Quit date: 10/31/1988  . Smokeless tobacco: Never Used  . Alcohol use 0.6 - 1.2 oz/week    1 - 2 Standard drinks or equivalent per week     Comment: occasional   . Drug use: No  . Sexual activity: Yes    Partners: Male

## 2016-05-25 ENCOUNTER — Ambulatory Visit: Payer: BC Managed Care – PPO | Admitting: Family Medicine

## 2016-05-25 ENCOUNTER — Ambulatory Visit: Payer: BC Managed Care – PPO | Admitting: Sports Medicine

## 2016-05-25 ENCOUNTER — Ambulatory Visit: Payer: BC Managed Care – PPO | Admitting: Physical Therapy

## 2016-05-26 ENCOUNTER — Ambulatory Visit: Payer: BC Managed Care – PPO | Admitting: Speech Pathology

## 2016-05-26 DIAGNOSIS — R41841 Cognitive communication deficit: Secondary | ICD-10-CM

## 2016-05-26 NOTE — Therapy (Signed)
to manage schedule, lists, recurring and non recurring tasks over 3 sessions with mod I   Baseline 1/25, 2/1, 2/6, 2/8   Status Achieved     SLP LONG TERM GOAL #2   Title Pt will divide attention between 2 complex cognitive linguistic tasks with 95% on each and supervision cues.    Time 1   Period Weeks   Status Achieved     SLP LONG TERM GOAL #3   Title Pt will complete complex abstract naming tasks with 95% accuracy and mod I   Time 1   Period Weeks   Status On-going     SLP LONG TERM GOAL #4   Title Pt will utilize compensations for word finding episodes with mod I during complex conversations over 2 sessions.   Status Achieved          Plan - 05/26/16 1107    Speech Therapy Frequency 2x / week   Treatment/Interventions Compensatory strategies;Patient/family education;Functional tasks;Cueing hierarchy;Cognitive reorganization;SLP instruction and feedback;Internal/external  aids;Language facilitation;Environmental controls   Potential to Achieve Goals Good   Potential Considerations Ability to learn/carryover information   Consulted and Agree with Plan of Care Patient      Patient will benefit from skilled therapeutic intervention in order to improve the following deficits and impairments:   Cognitive communication deficit    Problem List Patient Active Problem List   Diagnosis Date Noted  . Chronic pain of right knee 05/21/2016  . Hamstring strain, sequela 05/21/2016  . Wart viral 05/12/2016  . Contusion of knee 05/05/2016  . Fall involving sidewalk curb 05/05/2016  . Aphasia 03/13/2016  . Shoulder pain, bilateral 12/11/2015  . Fibromyalgia 12/11/2015  . Post concussion syndrome 11/01/2015  . Clavicle fracture 10/30/2015  . Hair loss 10/11/2015  . Pain in joint, shoulder region 09/11/2015  . Right wrist pain 08/07/2015  . Splinter in skin 07/30/2015  . Fall from ladder 07/30/2015  . Skull fracture with concussion ([place]) 07/30/2015  . Fracture of cervical vertebra, C6 ([place]) 07/30/2015  . Thoracic vertebral fracture ([place]) 07/30/2015  . Abdominal pain, epigastric 05/07/2015  . Scaly patch rash 02/26/2015  . Mitral valve regurgitation 09/27/2014  . PAT (paroxysmal atrial tachycardia) ([place]) 09/06/2014  . Tachycardia 07/31/2014  . Sprain of ankle 06/29/2014  . Earache, left 05/15/2013  . Dizziness 05/15/2013  . Asthmatic bronchitis 05/08/2013  . Benign paroxysmal positional vertigo 05/08/2013  . Chest wall pain 01/26/2013  . LUQ abdominal pain 01/20/2013  . Insomnia 01/10/2013  . Depression (emotion) 01/10/2013  . URI, acute 08/25/2012  . Snoring 08/25/2012  . IBS (irritable bowel syndrome) 02/08/2012  . Multiple pulmonary nodules 02/08/2012  . Chest heaviness 11/16/2010  . GERD 05/24/2010  . SINUSITIS, MAXILLARY, CHRONIC 05/20/2010  . Myalgia and myositis 11/20/2009  . Fatigue 11/20/2009  . ARTHRALGIA 06/26/2009  . Cervicogenic  headache 06/26/2009  . TOBACCO USE, QUIT 06/26/2009  . LOW BACK PAIN 02/20/2009  . Rosacea 12/27/2008  . PRURITUS 12/27/2008  . Allergic urticaria 12/27/2008  . B12 deficiency 01/02/2008    [name], Annye Rusk MS, CCC-SLP 05/26/2016, 11:09 AM  [facility] [address] [place], [place], [address] Phone: [phone]   Fax:  [phone]   Name: Regina [name] MRN: [id number] Date of Birth: [birth year]  [facility] [address] [place], [place], [address] Phone: [phone]   Fax:  [phone]  Speech Language Pathology Treatment  Patient Details  Name: Regina [name] MRN: [id number] Date of Birth: 1966/01/01 Referring Provider: Dr. Alger Simons  Encounter Date: 05/26/2016      End of Session - 05/26/16 1108    Visit Number 11   Number of Visits 13   Date for SLP Re-Evaluation 06/11/16   SLP Start Time 0940   SLP Stop Time  1015   SLP Time Calculation (min) 35 min   Activity Tolerance Patient tolerated treatment well      Past Medical History:  Diagnosis Date  . Allergic urticaria   . ARTHRALGIA   . Arthritis   . Atrial fibrillation ([place])   . B12 DEFICIENCY   . CFS (chronic fatigue syndrome)   . Endometriosis   . Epidural hemorrhage with loss of consciousness ([place]) 06/29/15  . Fibroid   . Fibromyalgia   . Fracture of cervical vertebra, C6 ([place]) 06/29/15  . Gastritis   . GERD   . Heart murmur   . History of endometriosis   . IBS (irritable bowel syndrome)   . Internal hemorrhoids   . MVP (mitral valve prolapse)   . Osteoarthritis   . Rosacea   . Thoracic compression fracture ([place]) 06/29/15   T2-T6    Past Surgical History:  Procedure Laterality Date  . ABDOMINAL HYSTERECTOMY     partial  . ABDOMINAL HYSTERECTOMY    . ANAL FISSURE REPAIR     Dr Rise [name] 2009  . APPENDECTOMY    . COLPOSCOPY    . HEMORRHOID SURGERY     Dr. Rise [name] 2009  . OVARIAN CYST REMOVAL    . RT BUNIONECTOMT    . TONSILLECTOMY AND ADENOIDECTOMY    . TUBAL LIGATION    . UTERINE FIBROID SURGERY      There were no vitals filed for this visit.      Subjective Assessment - 05/26/16 0946    Subjective "I think I'm progressing and doing better"   Currently in Pain? Yes   Pain Score 4    Pain Location Neck   Pain Orientation Left;Right   Pain Descriptors / Indicators Tingling   Pain Type Chronic pain   Pain Onset  More than a month ago   Pain Frequency Constant   Aggravating Factors  moving head, sleep   Pain Relieving Factors rest, heat decompression   Multiple Pain Sites No               ADULT SLP TREATMENT - 05/26/16 0948      General Information   Behavior/Cognition Alert;Cooperative;Pleasant mood     Treatment Provided   Treatment provided Cognitive-Linquistic     Cognitive-Linquistic Treatment   Treatment focused on Cognition;Aphasia;Patient/family/caregiver education   Skilled Treatment Pt arrived late to session - Targeted complex naming of math term for each letter of alphabet twice with minimal extended time and dividing attentio between this and auditory /verbal naming task  with 95% accuracy.      Assessment / Recommendations / Plan   Plan Continue with current plan of care     Progression Toward Goals   Progression toward goals Progressing toward goals              SLP Long Term Goals - 05/26/16 1107      SLP LONG TERM GOAL #1   Title Pt will utilize external aids

## 2016-05-27 ENCOUNTER — Ambulatory Visit: Payer: BC Managed Care – PPO

## 2016-05-27 DIAGNOSIS — M546 Pain in thoracic spine: Secondary | ICD-10-CM | POA: Diagnosis not present

## 2016-05-27 DIAGNOSIS — M62838 Other muscle spasm: Secondary | ICD-10-CM

## 2016-05-27 DIAGNOSIS — M6281 Muscle weakness (generalized): Secondary | ICD-10-CM

## 2016-05-27 NOTE — Therapy (Signed)
[facility] [address] [place], [place], [address] Phone: [phone]   Fax:  [phone]  Physical Therapy Treatment  Patient Details  Name: Regina [name] MRN: [id number] Date of Birth: Aug 15, 1965 Referring Provider: Dr. Lew Dawes  Encounter Date: 05/27/2016      PT End of Session - 05/27/16 1016    Visit Number 15   Date for PT Re-Evaluation 06/24/16   PT Start Time 0938  pt late   PT Stop Time 1030   PT Time Calculation (min) 52 min   Behavior During Therapy [facility] [address] for tasks assessed/performed      Past Medical History:  Diagnosis Date  . Allergic urticaria   . ARTHRALGIA   . Arthritis   . Atrial fibrillation ([place])   . B12 DEFICIENCY   . CFS (chronic fatigue syndrome)   . Endometriosis   . Epidural hemorrhage with loss of consciousness ([place]) 06/29/15  . Fibroid   . Fibromyalgia   . Fracture of cervical vertebra, C6 ([place]) 06/29/15  . Gastritis   . GERD   . Heart murmur   . History of endometriosis   . IBS (irritable bowel syndrome)   . Internal hemorrhoids   . MVP (mitral valve prolapse)   . Osteoarthritis   . Rosacea   . Thoracic compression fracture ([place]) 06/29/15   T2-T6    Past Surgical History:  Procedure Laterality Date  . ABDOMINAL HYSTERECTOMY     partial  . ABDOMINAL HYSTERECTOMY    . ANAL FISSURE REPAIR     Dr Rise [name] 2009  . APPENDECTOMY    . COLPOSCOPY    . HEMORRHOID SURGERY     Dr. Rise [name] 2009  . OVARIAN CYST REMOVAL    . RT BUNIONECTOMT    . TONSILLECTOMY AND ADENOIDECTOMY    . TUBAL LIGATION    . UTERINE FIBROID SURGERY      There were no vitals filed for this visit.      Subjective Assessment - 05/27/16 0940    Subjective I hope to go back to work 06/15/16.  Will discuss with MD 06/11/16.  I am getting a catch in my Rt shoulder.    Patient Stated Goals reduce pain, turn neck instead of body   Currently in Pain? Yes   Pain Score 3    Pain Location Neck    Pain Orientation Right;Left   Pain Descriptors / Indicators Tightness;Spasm   Pain Type Chronic pain   Pain Onset More than a month ago   Pain Frequency Constant   Aggravating Factors  turning heat, sleep   Pain Relieving Factors rest, heat                         [facility] Adult PT Treatment/Exercise - 05/27/16 0001      Neck Exercises: Machines for Strengthening   UBE (Upper Arm Bike) Level 1 x 8 minutes (3/3)  PT present to discuss progress     Neck Exercises: Seated   Shoulder Flexion 20 reps;Both   Shoulder Flexion Weights (lbs) 1   Shoulder ABduction 20 reps;Both   Shoulder Abduction Weights (lbs) 1   Shoulder Abduction Limitations scaption 1# 2x10   Upper Extremity D2 Flexion;20 reps;Theraband   Theraband Level (UE D2) Level 1 (Yellow)   Other Seated Exercise horizontal abduction 2x10    Other Seated Exercise ER with yellow band: 2x10     Modalities   Modalities Electrical Stimulation;Moist Heat  Moist Heat Therapy   Number Minutes Moist Heat 15 Minutes   Moist Heat Location Cervical     Electrical Stimulation   Electrical Stimulation Location bil neck/upper trap   Electrical Stimulation Action IFC   Electrical Stimulation Parameters 15 minutes   Electrical Stimulation Goals Pain     Manual Therapy   Manual Therapy Myofascial release;Soft tissue mobilization   Manual therapy comments Pt supine   Soft tissue mobilization suboccipital release and trigger point release to bil. upper trap          Trigger Point Dry Needling - 05/27/16 0943    Consent Given? No   Muscles Treated Upper Body --   Upper Trapezius Response --   SubOccipitals Response --                PT Short Term Goals - 05/27/16 0946      PT SHORT TERM GOAL #3   Title ability to turn her head with pain decreased >/= 25% due to increase tissue mobility   Status Achieved           PT Long Term Goals - 05/27/16 0946      PT LONG TERM GOAL #1   Title  independent with HEP and understand how to progress herself   Time 12   Period Weeks   Status On-going     PT LONG TERM GOAL #2   Title ability to look upward into a cabinet with >/= 50% greater ease due to increase mobility and decreased pain.   Status Achieved     PT LONG TERM GOAL #4   Title ability to vacuum, clean dishes and cook with >/= 50% greater ease due to increased strength and decreased pain   Time 12   Period Weeks   Status On-going               Plan - 05/27/16 0947    Clinical Impression Statement Pt reports that her symptoms have improved by 50% since the start of care.  Pt reports 50% improvement in neck pain with looking up to put items away in cabinets.  Pt is able to do housework with less pain but needs to break this down into shorter time due to lack of endurance.  Pt would like to hold on dry needling until next week as she is feeling better now.  Pt will continue to benefit from skilled PT for strength, flexibility, manual and modalities.     Rehab Potential Good   Clinical Impairments Affecting Rehab Potential Fell on 06/29/2015 resulting with fractured skull, fractured T7, fractured shoulder, had 12 weeks of [facility] therapy   PT Frequency 3x / week   PT Duration 12 weeks   PT Treatment/Interventions Cryotherapy;Electrical Stimulation;Ultrasound;Traction;Moist Heat;Therapeutic activities;Therapeutic exercise;Neuromuscular re-education;Patient/family education;Passive range of motion;Manual techniques;Dry needling;Energy conservation;Taping   PT Next Visit Plan Postural strengthneing as tolerated, soft tisuue work.  Dry needling next week.     Consulted and Agree with Plan of Care Patient      Patient will benefit from skilled therapeutic intervention in order to improve the following deficits and impairments:  Decreased range of motion, Increased fascial restricitons, Decreased endurance, Increased muscle spasms, Pain, Hypomobility, Impaired  flexibility, Decreased mobility, Decreased strength  Visit Diagnosis: Pain in thoracic spine  Other muscle spasm  Muscle weakness (generalized)     Problem List Patient Active Problem List   Diagnosis Date Noted  . Chronic pain of right knee 05/21/2016  . Hamstring strain, sequela  05/21/2016  . Wart viral 05/12/2016  . Contusion of knee 05/05/2016  . Fall involving sidewalk curb 05/05/2016  . Aphasia 03/13/2016  . Shoulder pain, bilateral 12/11/2015  . Fibromyalgia 12/11/2015  . Post concussion syndrome 11/01/2015  . Clavicle fracture 10/30/2015  . Hair loss 10/11/2015  . Pain in joint, shoulder region 09/11/2015  . Right wrist pain 08/07/2015  . Splinter in skin 07/30/2015  . Fall from ladder 07/30/2015  . Skull fracture with concussion ([place]) 07/30/2015  . Fracture of cervical vertebra, C6 ([place]) 07/30/2015  . Thoracic vertebral fracture ([place]) 07/30/2015  . Abdominal pain, epigastric 05/07/2015  . Scaly patch rash 02/26/2015  . Mitral valve regurgitation 09/27/2014  . PAT (paroxysmal atrial tachycardia) ([place]) 09/06/2014  . Tachycardia 07/31/2014  . Sprain of ankle 06/29/2014  . Earache, left 05/15/2013  . Dizziness 05/15/2013  . Asthmatic bronchitis 05/08/2013  . Benign paroxysmal positional vertigo 05/08/2013  . Chest wall pain 01/26/2013  . LUQ abdominal pain 01/20/2013  . Insomnia 01/10/2013  . Depression (emotion) 01/10/2013  . URI, acute 08/25/2012  . Snoring 08/25/2012  . IBS (irritable bowel syndrome) 02/08/2012  . Multiple pulmonary nodules 02/08/2012  . Chest heaviness 11/16/2010  . GERD 05/24/2010  . SINUSITIS, MAXILLARY, CHRONIC 05/20/2010  . Myalgia and myositis 11/20/2009  . Fatigue 11/20/2009  . ARTHRALGIA 06/26/2009  . Cervicogenic headache 06/26/2009  . TOBACCO USE, QUIT 06/26/2009  . LOW BACK PAIN 02/20/2009  . Rosacea 12/27/2008  . PRURITUS 12/27/2008  . Allergic urticaria 12/27/2008  . B12 deficiency 01/02/2008     Sigurd Sos,  PT 05/27/16 10:22 AM  [facility] [address] [place], [place], [address] Phone: [phone]   Fax:  [phone]  Name: CLEO STEG MRN: [id number] Date of Birth: 1966/02/03

## 2016-05-28 ENCOUNTER — Ambulatory Visit: Payer: BC Managed Care – PPO | Attending: Internal Medicine | Admitting: Speech Pathology

## 2016-05-28 ENCOUNTER — Ambulatory Visit: Payer: BC Managed Care – PPO | Admitting: Internal Medicine

## 2016-05-28 DIAGNOSIS — R41841 Cognitive communication deficit: Secondary | ICD-10-CM | POA: Diagnosis present

## 2016-05-28 NOTE — Therapy (Signed)
[facility] [address] [place], [place], [address] Phone: [phone]   Fax:  [phone]  Speech Language Pathology Treatment  Patient Details  Name: Regina [name] MRN: [id number] Date of Birth: [birth year] Referring Provider: Dr. Alger Simons  Encounter Date: 05/28/2016      End of Session - 05/28/16 1437    Visit Number 12   Number of Visits 13   Date for SLP Re-Evaluation 06/11/16   SLP Start Time 0300   SLP Stop Time  1446   SLP Time Calculation (min) 39 min      Past Medical History:  Diagnosis Date  . Allergic urticaria   . ARTHRALGIA   . Arthritis   . Atrial fibrillation ([place])   . B12 DEFICIENCY   . CFS (chronic fatigue syndrome)   . Endometriosis   . Epidural hemorrhage with loss of consciousness ([place]) 06/29/15  . Fibroid   . Fibromyalgia   . Fracture of cervical vertebra, C6 ([place]) 06/29/15  . Gastritis   . GERD   . Heart murmur   . History of endometriosis   . IBS (irritable bowel syndrome)   . Internal hemorrhoids   . MVP (mitral valve prolapse)   . Osteoarthritis   . Rosacea   . Thoracic compression fracture ([place]) 06/29/15   T2-T6    Past Surgical History:  Procedure Laterality Date  . ABDOMINAL HYSTERECTOMY     partial  . ABDOMINAL HYSTERECTOMY    . ANAL FISSURE REPAIR     Dr Rise [name] 2009  . APPENDECTOMY    . COLPOSCOPY    . HEMORRHOID SURGERY     Dr. Rise [name] 2009  . OVARIAN CYST REMOVAL    . RT BUNIONECTOMT    . TONSILLECTOMY AND ADENOIDECTOMY    . TUBAL LIGATION    . UTERINE FIBROID SURGERY      There were no vitals filed for this visit.      Subjective Assessment - 05/28/16 1410    Subjective  "I've been organizing my bills"               ADULT SLP TREATMENT - 05/28/16 1410      General Information   Behavior/Cognition Alert;Cooperative;Pleasant mood     Treatment Provided   Treatment provided Cognitive-Linquistic     Pain Assessment   Pain  Assessment 0-10   Pain Score 3    Pain Location neck and cape   Pain Descriptors / Indicators Aching;Contraction;Discomfort   Pain Intervention(s) Monitored during session     Cognitive-Linquistic Treatment   Treatment focused on Cognition;Aphasia;Patient/family/caregiver education   Skilled Treatment Pt has reported 1st time recalling conversation from last week with PT - Pt recalled directions to her home from here but is still using GPS to get anywhere.  Divided attention between complex card sort  and mildly complex conversation with 100% on each . Pt noting some improvement with recall. Pt with improved anticaptory awarness. Compensations for word finding episodes with mod I.      Assessment / Recommendations / Plan   Plan Continue with current plan of care     Progression Toward Goals   Progression toward goals Progressing toward goals         SPEECH THERAPY DISCHARGE SUMMARY  Visits from Start of Care: 12  Current functional level related to goals / functional outcomes: See goals below   Remaining deficits: High level cognition; reduced memory; word finding   Education / Equipment: Compensations for cognitive  deficits; cognitive activities to do at home Plan: Patient agrees to discharge.  Patient goals were not met. Patient is being discharged due to meeting the stated rehab goals.  ?????           SLP Long Term Goals - 05/28/16 1437      SLP LONG TERM GOAL #1   Title Pt will utilize external aids to manage schedule, lists, recurring and non recurring tasks over 3 sessions with mod I   Baseline 1/25, 2/1, 2/6, 2/8   Status Achieved     SLP LONG TERM GOAL #2   Title Pt will divide attention between 2 complex cognitive linguistic tasks with 95% on each and supervision cues.    Time 1   Period Weeks   Status Achieved     SLP LONG TERM GOAL #3   Title Pt will complete complex abstract naming tasks with 95% accuracy and mod I   Time 1   Period Weeks    Status Achieved      SLP LONG TERM GOAL #4   Title Pt will utilize compensations for word finding episodes with mod I during complex conversations over 2 sessions.   Status Achieved          Plan - 05/28/16 1433    Clinical Impression Statement Pt has met all ST goals - d/c ST - pt in agreement   Speech Therapy Frequency 2x / week   Treatment/Interventions Compensatory strategies;Patient/family education;Functional tasks;Cueing hierarchy;Cognitive reorganization;SLP instruction and feedback;Internal/external aids;Language facilitation;Environmental controls   Potential to Achieve Goals Good   Potential Considerations Ability to learn/carryover information   Consulted and Agree with Plan of Care Patient      Patient will benefit from skilled therapeutic intervention in order to improve the following deficits and impairments:   Cognitive communication deficit    Problem List Patient Active Problem List   Diagnosis Date Noted  . Chronic pain of right knee 05/21/2016  . Hamstring strain, sequela 05/21/2016  . Wart viral 05/12/2016  . Contusion of knee 05/05/2016  . Fall involving sidewalk curb 05/05/2016  . Aphasia 03/13/2016  . Shoulder pain, bilateral 12/11/2015  . Fibromyalgia 12/11/2015  . Post concussion syndrome 11/01/2015  . Clavicle fracture 10/30/2015  . Hair loss 10/11/2015  . Pain in joint, shoulder region 09/11/2015  . Right wrist pain 08/07/2015  . Splinter in skin 07/30/2015  . Fall from ladder 07/30/2015  . Skull fracture with concussion ([place]) 07/30/2015  . Fracture of cervical vertebra, C6 ([place]) 07/30/2015  . Thoracic vertebral fracture ([place]) 07/30/2015  . Abdominal pain, epigastric 05/07/2015  . Scaly patch rash 02/26/2015  . Mitral valve regurgitation 09/27/2014  . PAT (paroxysmal atrial tachycardia) ([place]) 09/06/2014  . Tachycardia 07/31/2014  . Sprain of ankle 06/29/2014  . Earache, left 05/15/2013  . Dizziness 05/15/2013  . Asthmatic bronchitis  05/08/2013  . Benign paroxysmal positional vertigo 05/08/2013  . Chest wall pain 01/26/2013  . LUQ abdominal pain 01/20/2013  . Insomnia 01/10/2013  . Depression (emotion) 01/10/2013  . URI, acute 08/25/2012  . Snoring 08/25/2012  . IBS (irritable bowel syndrome) 02/08/2012  . Multiple pulmonary nodules 02/08/2012  . Chest heaviness 11/16/2010  . GERD 05/24/2010  . SINUSITIS, MAXILLARY, CHRONIC 05/20/2010  . Myalgia and myositis 11/20/2009  . Fatigue 11/20/2009  . ARTHRALGIA 06/26/2009  . Cervicogenic headache 06/26/2009  . TOBACCO USE, QUIT 06/26/2009  . LOW BACK PAIN 02/20/2009  . Rosacea 12/27/2008  . PRURITUS 12/27/2008  . Allergic urticaria 12/27/2008  .  B12 deficiency 01/02/2008    Lovvorn, Annye Rusk MS, CCC-SLP 05/28/2016, 2:52 PM  [facility] [address] [place], [place], [address] Phone: [phone]   Fax:  [phone]   Name: GARA KINCADE MRN: [id number] Date of Birth: Nov 11, 1965

## 2016-05-29 ENCOUNTER — Telehealth: Payer: Self-pay | Admitting: Obstetrics and Gynecology

## 2016-05-29 ENCOUNTER — Encounter: Payer: BC Managed Care – PPO | Admitting: Physical Therapy

## 2016-05-29 NOTE — Telephone Encounter (Signed)
Spoke with patient regarding benefit for colposcopy, which was previously scheduled and canceled.  Patient understood and agreeable. Patient ready to re-schedule. Patient scheduled 06/03/16 with Dr Talbert Nan. Patient aware of date, arrival time and cancellation policy. No further questions.   Routing to Dr Talbert Nan for final review

## 2016-05-29 NOTE — Telephone Encounter (Signed)
Patient has rescheduled 06/03/16 with Dr Talbert Nan

## 2016-05-29 NOTE — Progress Notes (Signed)
NEUROPSYCHOLOGICAL EVALUATION   Name:    Regina [name]  Date of Birth:   1965-12-21 Date of Interview:  05/11/2016 Date of Testing:  05/14/2016   Date of Feedback:  06/02/2016       Background Information:  Reason for Referral:  Regina [name] (goes by Regina [name]) is a 51 y.o. female referred by Dr. Narda Amber to assess her current level of cognitive functioning and assist in differential diagnosis. The current evaluation consisted of a review of available medical records, an interview with the patient, and the completion of a neuropsychological testing battery. Informed consent was obtained.  History of Presenting Problem:  Regina [name] reported that she fell 20 feet from an extension ladder on 06/29/2015 onto concrete. She does not think she lost consciousness. She was beginning to climb down an extension ladder from a tree house on her parents' property, she felt the ladder slide a little bit, panicked and grabbed a post on the treehouse. She does not remember hitting the ground. She remembers lying on the concrete and her daughter talking to her. She had the wind knocked out of her and could not breathe. Once she regained her breathing, she was able to answer questions appropriately. EMS was called and she went to the ED where she was admitted and reportedly hospitalized for five days. CT of the head on 06/29/2015 showed acute epidural hemorrhage overlying the right frontoparietal lobe with slight mass effect on the underlying frontoparietal lobe parenchyma, no appreciable midline shift, slightly displaced fracture of the right frontoparietal bone, and stable posterior fracture of the lamina at C6 bilaterally. She also had nondisplaced fractures at T3-T7. Per Dr. Serita Grit notes, she saw Dr. Bridgette Habermann, neurosurgeon at [facility], in May 2017 who repeated her CT scans which showed complete resolution of intracranial blood products and released her to orthopedics for her spine issues.  The patient  reported that she noticed short term memory difficulties shortly after the accident. Previously, she states she had "a memory like an elephant". At first, her family got very frustrated by her forgetfulness because it was so uncharacteristic for her. She complains of ongoing short term memory difficulties, feeling as if she is in a fog "and going through the motions but I don't really know how", inability to picture routes in her head when she is driving, forgetting (briefly) where she parked in a parking lot, paraphasic errors when speaking, transposing letters when typing, forgetting appointments unless a reminder alarm goes off, inability to multitask, distractibility, . She notes that before the injury, she was doing Lumosity regularly, and after the injury her scores went down significantly and still have not improved to the level she was at before.  She has been engaging in cognitive rehabilitation through SLP at the [facility] in [place], as well as physical therapy at the [facility] in [place]. She notes frustration surrounding being told that her cognitive functioning is going to get better. She states should be happier if someone told her "this is the new normal, deal with it".   She states she is unable to work full time due to her cognitive difficulties. 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 She states that she now would not be able to concentrate for a full 8-hour day. She is able to do some part-time work. 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 She notes she has  to check and double check her work and still may not catch errors.   Regina [name] continues to manage all instrumental ADLs, including driving (uses GPS), medications (sets timers to remind her), finances, appointments (sets alarm reminders), and cooking.  Physically, she complains of chronic  headaches and daily neck pain. Her headaches are getting better with PT. She has BPPV. Normally she doesn't have any nausea but with one exercise at PT she does experience some nausea. She complains of reduced balance since the injury.  She has no prior history of head injury. She has had one fall since the accident in 06/2015. Last week, she fell in front of her PT office. She tripped on the curb. She did hit the ground with her face. The following day, she reportedly demonstrated more language difficulties at her rehab session.   The patient denies any psychiatric history prior to her injury. She denied history of mental health treatment or suicidal ideation/intention. Since the injury, she has experienced significant anxiety. She has had one "full blown panic attack" but frequently experiences panic symptoms. She is learning to cope with these symptoms better. She denies any significant or ongoing depression. Her sleep is poor; she has to wake up frequently to change position. Her appetite is good. She has gained 40 lbs, likely due to reduced physical activity secondary to pain. She notes that she has less interest in leaving her home than she used to but she does not avoid doing so. She is taking Effexor but is not sure when that was started. She feels very supported by her family and friends.  The patient states that her goals are to "understand the new normal, how to cope with it, and how I can go back to work full time."   Social History: Born/Raised: [place] Education: Manufacturing engineer of arts in [place] history: Teaching since 2000 Marital history: She has been married twice. She is divorced from her first husband. She has been married to her current husband for 18 mos, supportive relationship. She has 2 daughters (ages 50 and 74) and 3 grandchildren. Alcohol/Tobacco/Substances: usually 1 drink, maybe once a week. Never a heavy drinker. Former smoker until pregnant with first child (quit 27  years ago). No SA.   Medical History:  Past Medical History:  Diagnosis Date  . Allergic urticaria   . ARTHRALGIA   . Arthritis   . Atrial fibrillation ([place])   . B12 DEFICIENCY   . CFS (chronic fatigue syndrome)   . Endometriosis   . Epidural hemorrhage with loss of consciousness ([place]) 06/29/15  . Fibroid   . Fibromyalgia   . Fracture of cervical vertebra, C6 ([place]) 06/29/15  . Gastritis   . GERD   . Heart murmur   . History of endometriosis   . IBS (irritable bowel syndrome)   . Internal hemorrhoids   . MVP (mitral valve prolapse)   . Osteoarthritis   . Rosacea   . Thoracic compression fracture ([place]) 06/29/15   T2-T6    Current medications:  Outpatient Encounter Prescriptions as of 06/02/2016  Medication Sig  . Acetaminophen-Codeine (TYLENOL/CODEINE #3) 300-30 MG tablet Take 0.5-1 tablets by mouth every 4 (four) hours as needed for pain.  Marland Kitchen CASCARA SAGRADA PO Take by mouth.  . Cholecalciferol (GNP VITAMIN D) 1000 units tablet Take 2 tablets (2,000 Units total) by mouth daily.  . cyanocobalamin (,VITAMIN B-12,) 1000 MCG/ML injection Inject 1 mL (1,000 mcg total) into the skin every 14 (fourteen) days.  . ergocalciferol (VITAMIN  D2) 50000 units capsule Take 1 capsule (50,000 Units total) by mouth once a week.  . fluocinonide (LIDEX) 0.05 % external solution   . hydrocortisone 2.5 % cream Apply topically 2 (two) times daily.  . Phosphatidylserine-DHA-EPA (VAYACOG) 100-19.5-6.5 MG CAPS Take 1 capsule by mouth daily.  . SYRINGE-NEEDLE, DISP, 3 ML (BD ECLIPSE SYRINGE) 25G X 1" 3 ML MISC Use IM  . Turmeric 500 MG TABS Take by mouth 2 (two) times daily.  Marland Kitchen venlafaxine XR (EFFEXOR XR) 37.5 MG 24 hr capsule Take 1 capsule (37.5 mg total) by mouth daily with breakfast.   No facility-administered encounter medications on file as of 06/02/2016.      Current Examination:  Behavioral Observations:   Appearance: Neatly and casually dressed, appropriately groomed Gait: Ambulated  independently, no gross abnormalities observed Speech: Fluent; normal rate, rhythm and volume. Mild to moderate word finding difficulty, which seems exacerbated by anxiety. Patient is very vigilant of word finding difficulties and points out any difficulty during the interview. Thought process: Linear, goal directed Affect: Full, generally euthymic, appropriate to situation, stable Interpersonal: Pleasant, appropriate Orientation: Oriented to all spheres. Accurately named the current President and his predecessor.  Tests Administered: . Test of Premorbid Functioning (TOPF) . Wechsler Adult Intelligence Scale-Fourth Edition (WAIS-IV): Similarities, Block Design, Matrix Reasoning, Arithmetic, Symbol Search, Coding and Digit Span subtests . Wechsler Memory Scale-Fourth Edition (WMS-IV) Adult Version (ages 4-69): Logical Memory I, II and Recognition subtests  . [place] Verbal Learning Test - 2nd Edition (CVLT-2) Standard Form . Rey Complex Figure Test (RCFT) . [employer] (WCST) . Controlled Oral Word Association Test (COWAT) . Trail Making Test A and B . Neuropsychological Assessment Battery (NAB) Language Module, Form 1:  Naming subtest . Personality Assessment Inventory (PAI)  Test Results: Note: Standardized scores are presented only for use by appropriately trained professionals and to allow for any future test-retest comparison. These scores should not be interpreted without consideration of all the information that is contained in the rest of the report. The most recent standardization samples from the test publisher or other sources were used whenever possible to derive standard scores; scores were corrected for age, gender, ethnicity and education when available.   Test Scores:  Test Name Raw Score Standardized Score Descriptor  TOPF 56/70 SS= 113 High average  WAIS-IV Subtests     Similarities 29/36 ss= 11 Average  Block Design 48/66 ss= 12 High average  Matrix  Reasoning 24/26 ss= 16 Very superior  Arithmetic 21/22 ss= 17 Very superior  Symbol Search 43/60 ss= 15 Superior  Coding 90/135 ss= 15 Superior  Digit Span 40/48 ss= 17 Very superior  WAIS-IV Index Scores     Working Memory  SS= 139 Very superior  Processing Speed  SS= 127 Superior  WMS-IV Subtests     LM I 37/50 ss= 14 Superior  LM II 25/50 ss= 12 High average  LM II Recognition 29/30 Cum %: >75   CVLT-II Scores     Trial 1 7/16 Z= 0 Average  Trial 5 15/16 Z= 1 High average  Trials 1-5 total 61/80 T= 64 Superior  SD Free Recall 15/16 Z= 1.5 Superior  SD Cued Recall 15/16 Z= 1 High average  LD Free Recall 14/16 Z= 1 High average  LD Cued Recall 15/16 Z= 1 High average  Recognition Discriminability 16/16 hits,0 false positives Z= 1.5 Superior  Forced Choice Recognition 16/16  WNL  RCFT     Copy 32/36 11-16%ile   3' Recall  30/36 T= 72 Very superior  30' Recall 26/36 T= 64 Superior  Recognition 20/24 T= 47 Average  WCST     Total Errors 9 T= 57 High average  Perseverative Responses 5 T= 55 Average  Perseverative Errors 5 T= 49 Average  Conceptual Level Responses 55 T= 56 Average  Categories Completed 5 >16%   Trials to Complete 1st Category 11 >16%   Failure to Maintain Set 0    COWAT-FAS 55 T= 59 High average  COWAT-Animals 26 T= 58 High average  Trail Making Test A  27" 1 error T= 58 High average  Trail Making Test B  33" 0 errors T= 69 Superior  NAB Naming 31/31 T= 53 Average  PAI No elevated clinical scales to report.       Description of Test Results:  Embedded performance validity indicators were within normal limits. As such, the patient's current performance on neurocognitive testing is judged to be a relatively accurate representation of her current level of neurocognitive functioning.   Premorbid verbal intellectual abilities were estimated to have been within the high average range based on a test of word reading. Psychomotor processing speed was superior.  Auditory attention and working memory were very superior. Visual-spatial construction was high average. Language abilities were intact. Specifically, confrontation naming was intact with 100% accuracy, and semantic verbal fluency was high average. With regard to verbal memory, encoding and acquisition of non-contextual information (i.e., word list) was superior across five learning trials. After an interference task, free recall was superior (15/16 items recalled). After a delay, free recall was high average (14/16 items recalled). Cued recall was high average (15/16 items recalled). Performance on a yes/no recognition task was superior with 100% accuracy. On another verbal memory test, encoding and acquisition of contextual auditory information (i.e., short stories) was superior. After a delay, free recall was high average. Performance on a yes/no recognition task was above average. With regard to non-verbal memory, free recall of visual information was very superior after a 3 minute delay and superior after a 30 minute delay. Performance on a yes/no recognition task was average. Executive functioning was intact. Mental flexibility and set-shifting were superior on Trails B. Verbal fluency with phonemic search restrictions was high average. Verbal abstract reasoning was average. Non-verbal abstract reasoning was very superior. Deductive reasoning and problem solving skills were intact.   On an extensive measure of psychopathology and personality functioning (PAI), validity indicators fell in the normal range, suggesting that the patient answered in a reasonably forthright manner and did not attempt to present an unrealistic or inaccurate impression that was either more negative or more positive than the clinical picture would warrant. The PAI clinical profile revealed no marked elevations that should be considered to indicate the presence of clinical psychopathology. According to the patient's self-report, she  describes NO significant problems in the following areas: unusual thoughts or peculiar experiences; antisocial behavior; problems with empathy; undue suspiciousness or hostility; extreme moodiness and impulsivity; unhappiness and depression; unusually elevated mood or heightened activity; marked anxiety; problematic behaviors used to manage anxiety.  Also, she reports NO significant problems with alcohol or drug abuse or dependence.  With respect to suicidal ideation, the patient is NOT reporting distress from thoughts of self-harm. Of note, the patient's responses on the PAI did suggest that her self-concept involves a fixed, rather negative self-evaluation.  She is likely to be self-critical and to focus upon past failures and lost opportunities.  She may be more troubled inwardly by self-doubt  and misgivings about her adequacy than is apparent to others.  She may tend to play down her successes as a result, dismissing such accomplishments as either good fortune or the result of the efforts of others.   Clinical Impressions: Postconcussion syndrome. No cognitive impairment appreciated on extensive neurocognitive testing. Test results were entirely within normal limits, with most areas above average and commensurate with estimated premorbid intellectual abilities. She did not demonstrate any impaired or even below-average performances. There were many test scores in the superior range. There was no evidence of a cognitive disorder. On psychological testing, there was no evidence of a primary psychiatric disorder. The constellation of subjective cognitive complaints, physiological symptoms and emotional changes following her concussion is consistent with a diagnosis of post-concussion syndrome (PCS). At this point (almost a year after the initial injury), it is most likely that the patient's cognitive complaints are related to secondary factors rather than the primary injury to the brain itself. Specifically,  sleep disturbance and chronic pain, as well as anxiety and personality factors (e.g., being a very harsh critic of herself), are likely affecting cognitive functioning in daily life, and her perception of cognitive functioning in daily life.    Recommendations/Plan: TREATMENT FOR ANXIETY AND ADJUSTMENT: The patient is currently taking Effexor. It is recommended that she consider therapy/counseling as well. This may be available with the new psychologist at the [facility]. CBT targeted at post-concussion syndrome symptoms is specifically recommended Cecille Rubin, S., & Owens Shark, R. (2012). Cognitive behavioural therapy and persistent post-concussional symptoms: Integrating conceptual issues and practical aspects in treatment. Neuropsychological Rehabilitation, 22(1), 1-25.] Additionally, mindfulness meditation may be useful for the patient.  EDUCATION REGARDING PCS: The patient was educated regarding PCS and the importance of treating anxiety and improving coping skills for other symptoms. She was provided with detailed written information regarding PCS.    Feedback to Patient: ROBERTO ARNOTT returned for a feedback appointment on 06/02/2016 to review the results of her neuropsychological evaluation with this provider. 25 minutes face-to-face time was spent reviewing her test results, my impressions and my recommendations as detailed above.    Total time spent on this patient's case: [id number] unit for interview with psychologist; [phone] units of testing by psychometrician under psychologist's supervision; [phone] units for medical record review, scoring of neuropsychological tests, interpretation of test results, preparation of this report, and review of results to the patient by psychologist.      Thank you for your referral of SALIHA MUNZER. Please feel free to contact me if you have any questions or concerns regarding this report.

## 2016-06-01 ENCOUNTER — Encounter: Payer: Self-pay | Admitting: Physical Therapy

## 2016-06-01 ENCOUNTER — Ambulatory Visit: Payer: BC Managed Care – PPO | Attending: Internal Medicine | Admitting: Physical Therapy

## 2016-06-01 ENCOUNTER — Encounter: Payer: BC Managed Care – PPO | Admitting: Psychology

## 2016-06-01 DIAGNOSIS — G4489 Other headache syndrome: Secondary | ICD-10-CM | POA: Insufficient documentation

## 2016-06-01 DIAGNOSIS — R41841 Cognitive communication deficit: Secondary | ICD-10-CM | POA: Insufficient documentation

## 2016-06-01 DIAGNOSIS — M546 Pain in thoracic spine: Secondary | ICD-10-CM | POA: Diagnosis present

## 2016-06-01 DIAGNOSIS — M542 Cervicalgia: Secondary | ICD-10-CM | POA: Insufficient documentation

## 2016-06-01 DIAGNOSIS — M62838 Other muscle spasm: Secondary | ICD-10-CM | POA: Diagnosis present

## 2016-06-01 DIAGNOSIS — M6281 Muscle weakness (generalized): Secondary | ICD-10-CM | POA: Diagnosis present

## 2016-06-01 NOTE — Therapy (Signed)
[facility] [address], [place], [place], [address] Phone: [phone]   Fax:  [phone]  Physical Therapy Treatment  Patient Details  Name: Regina [name] MRN: [id number] Date of Birth: 09-Mar-1966 Referring Provider: Dr. Lew Dawes  Encounter Date: 06/01/2016      PT End of Session - 06/01/16 0939    Visit Number 16   Date for PT Re-Evaluation 06/24/16   PT Start Time 0930   PT Stop Time 1020   PT Time Calculation (min) 50 min   Activity Tolerance Patient tolerated treatment well   Behavior During Therapy [facility] for tasks assessed/performed      Past Medical History:  Diagnosis Date  . Allergic urticaria   . ARTHRALGIA   . Arthritis   . Atrial fibrillation ([place])   . B12 DEFICIENCY   . CFS (chronic fatigue syndrome)   . Endometriosis   . Epidural hemorrhage with loss of consciousness ([place]) 06/29/15  . Fibroid   . Fibromyalgia   . Fracture of cervical vertebra, C6 ([place]) 06/29/15  . Gastritis   . GERD   . Heart murmur   . History of endometriosis   . IBS (irritable bowel syndrome)   . Internal hemorrhoids   . MVP (mitral valve prolapse)   . Osteoarthritis   . Rosacea   . Thoracic compression fracture ([place]) 06/29/15   T2-T6    Past Surgical History:  Procedure Laterality Date  . ABDOMINAL HYSTERECTOMY     partial  . ABDOMINAL HYSTERECTOMY    . ANAL FISSURE REPAIR     Dr Rise [name] 2009  . APPENDECTOMY    . COLPOSCOPY    . HEMORRHOID SURGERY     Dr. Rise [name] 2009  . OVARIAN CYST REMOVAL    . RT BUNIONECTOMT    . TONSILLECTOMY AND ADENOIDECTOMY    . TUBAL LIGATION    . UTERINE FIBROID SURGERY      There were no vitals filed for this visit.      Subjective Assessment - 06/01/16 0936    Subjective Having sharp shooting pain in Rt sholder under scapula causing me to double over in pain. It's not all the time, it's just like a catch.    Pertinent History fibromyalgia;  had previous PT  neck and shoulder   Patient Stated Goals reduce pain, turn neck instead of body   Currently in Pain? Yes   Pain Score 3    Pain Location Neck   Pain Orientation Right;Left   Pain Descriptors / Indicators Tightness;Spasm   Pain Type Chronic pain   Pain Onset More than a month ago   Pain Frequency Constant   Aggravating Factors  Turning, heat, sleep   Pain Relieving Factors rest, heat                         [facility] Adult PT Treatment/Exercise - 06/01/16 0001      Neck Exercises: Machines for Strengthening   UBE (Upper Arm Bike) Level 1 x 8 minutes (3/3)  PT present to discuss progress     Neck Exercises: Seated   Shoulder Flexion 20 reps;Both   Shoulder Flexion Weights (lbs) 1   Shoulder ABduction 20 reps;Both   Shoulder Abduction Weights (lbs) 1   Shoulder Abduction Limitations Scaption 2x10 #1   Upper Extremity D2 Flexion;20 reps;Theraband   Theraband Level (UE D2) Level 1 (Yellow)   Other Seated Exercise horizontal abduction 2x10    Other  Seated Exercise ER with yellow band: 2x10     Modalities   Modalities Electrical Stimulation;Moist Heat     Moist Heat Therapy   Number Minutes Moist Heat 15 Minutes   Moist Heat Location Cervical     Electrical Stimulation   Electrical Stimulation Location bil neck/upper trap   Electrical Stimulation Action IFC   Electrical Stimulation Parameters 15 minutes   Electrical Stimulation Goals Pain     Manual Therapy   Manual Therapy Soft tissue mobilization;Myofascial release   Manual therapy comments Pt supine   Soft tissue mobilization Suboccipitals, SCM, and cervical muscles; scalenes; lateral pterygoid muscle; platysimus; masseter                  PT Short Term Goals - 05/27/16 0946      PT SHORT TERM GOAL #3   Title ability to turn her head with pain decreased >/= 25% due to increase tissue mobility   Status Achieved           PT Long Term Goals - 06/01/16 0939      PT LONG TERM GOAL #1    Title independent with HEP and understand how to progress herself   Time 12   Period Weeks   Status On-going     PT LONG TERM GOAL #4   Title ability to vacuum, clean dishes and cook with >/= 50% greater ease due to increased strength and decreased pain   Time 12   Period Weeks   Status On-going     PT LONG TERM GOAL #5   Title FOTO score </= 45% limitation   Time 12   Period Weeks   Status On-going               Plan - 06/01/16 1009    Clinical Impression Statement Pt continues to have constant increased tightness and pain in neck. Able to tolerate all strengthening exercises well with no increased pain. Pt will continue to benefit from skilled therapy for posture and core stability.    Rehab Potential Good   Clinical Impairments Affecting Rehab Potential Fell on 06/29/2015 resulting with fractured skull, fractured T7, fractured shoulder, had 12 weeks of [facility] therapy   PT Frequency 3x / week   PT Duration 12 weeks   PT Treatment/Interventions Cryotherapy;Electrical Stimulation;Ultrasound;Traction;Moist Heat;Therapeutic activities;Therapeutic exercise;Neuromuscular re-education;Patient/family education;Passive range of motion;Manual techniques;Dry needling;Energy conservation;Taping   PT Next Visit Plan Dry needling, postural strengthening   Consulted and Agree with Plan of Care Patient      Patient will benefit from skilled therapeutic intervention in order to improve the following deficits and impairments:  Decreased range of motion, Increased fascial restricitons, Decreased endurance, Increased muscle spasms, Pain, Hypomobility, Impaired flexibility, Decreased mobility, Decreased strength  Visit Diagnosis: Pain in thoracic spine  Other muscle spasm  Muscle weakness (generalized)  Other headache syndrome     Problem List Patient Active Problem List   Diagnosis Date Noted  . Chronic pain of right knee 05/21/2016  . Hamstring strain, sequela  05/21/2016  . Wart viral 05/12/2016  . Contusion of knee 05/05/2016  . Fall involving sidewalk curb 05/05/2016  . Aphasia 03/13/2016  . Shoulder pain, bilateral 12/11/2015  . Fibromyalgia 12/11/2015  . Post concussion syndrome 11/01/2015  . Clavicle fracture 10/30/2015  . Hair loss 10/11/2015  . Pain in joint, shoulder region 09/11/2015  . Right wrist pain 08/07/2015  . Splinter in skin 07/30/2015  . Fall from ladder 07/30/2015  . Skull fracture with concussion (  [place]) 07/30/2015  . Fracture of cervical vertebra, C6 ([place]) 07/30/2015  . Thoracic vertebral fracture ([place]) 07/30/2015  . Abdominal pain, epigastric 05/07/2015  . Scaly patch rash 02/26/2015  . Mitral valve regurgitation 09/27/2014  . PAT (paroxysmal atrial tachycardia) ([place]) 09/06/2014  . Tachycardia 07/31/2014  . Sprain of ankle 06/29/2014  . Earache, left 05/15/2013  . Dizziness 05/15/2013  . Asthmatic bronchitis 05/08/2013  . Benign paroxysmal positional vertigo 05/08/2013  . Chest wall pain 01/26/2013  . LUQ abdominal pain 01/20/2013  . Insomnia 01/10/2013  . Depression (emotion) 01/10/2013  . URI, acute 08/25/2012  . Snoring 08/25/2012  . IBS (irritable bowel syndrome) 02/08/2012  . Multiple pulmonary nodules 02/08/2012  . Chest heaviness 11/16/2010  . GERD 05/24/2010  . SINUSITIS, MAXILLARY, CHRONIC 05/20/2010  . Myalgia and myositis 11/20/2009  . Fatigue 11/20/2009  . ARTHRALGIA 06/26/2009  . Cervicogenic headache 06/26/2009  . TOBACCO USE, QUIT 06/26/2009  . LOW BACK PAIN 02/20/2009  . Rosacea 12/27/2008  . PRURITUS 12/27/2008  . Allergic urticaria 12/27/2008  . B12 deficiency 01/02/2008    Regina [name] PTA 06/01/2016, 12:32 PM  [facility] [address], [place], [place], [address] Phone: [phone]   Fax:  [phone]  Name: Regina [name] MRN: [id number] Date of Birth: 04-Dec-1965

## 2016-06-02 ENCOUNTER — Encounter: Payer: Self-pay | Admitting: Psychology

## 2016-06-02 ENCOUNTER — Ambulatory Visit (INDEPENDENT_AMBULATORY_CARE_PROVIDER_SITE_OTHER): Payer: BC Managed Care – PPO | Admitting: Psychology

## 2016-06-02 DIAGNOSIS — S060X0S Concussion without loss of consciousness, sequela: Secondary | ICD-10-CM

## 2016-06-02 DIAGNOSIS — F0781 Postconcussional syndrome: Secondary | ICD-10-CM

## 2016-06-02 NOTE — Patient Instructions (Addendum)
  Test results were entirely within normal limits, with most areas above average. You did not demonstrate any impaired or even below-average performances. There were many test scores in the superior range. There was no evidence of a cognitive disorder. On psychological testing, there was no evidence of a primary psychiatric disorder. The constellation of subjective cognitive complaints, physiological symptoms and emotional changes following concussion is consistent with a diagnosis of post-concussion syndrome (PCS). At this point (almost a year after the initial injury), it is most likely that cognitive symptoms you are experiencing are related to secondary factors rather than the primary injury to the brain itself. Specifically, sleep disturbance and chronic pain, as well as anxiety and personality factors (e.g., there is evidence you may be a very harsh critic of yourself), are likely affecting cognitive functioning in daily life, and your perception of cognitive functioning in daily life.   I would recommend that you consider counseling with a psychologist well versed in postconcussion syndrome.  This may be available with the new psychologist at [facility]. CBT targeted at post-concussion syndrome symptoms is specifically recommended Cecille Rubin, S., & Owens Shark, R. (2012). Cognitive behavioural therapy and persistent post-concussional symptoms: Integrating conceptual issues and practical aspects in treatment. Neuropsychological Rehabilitation, 22(1), 1-25.]   Additionally, mindfulness meditation may be useful in managing symptoms of PCS.

## 2016-06-03 ENCOUNTER — Encounter: Payer: Self-pay | Admitting: Obstetrics and Gynecology

## 2016-06-03 ENCOUNTER — Ambulatory Visit (INDEPENDENT_AMBULATORY_CARE_PROVIDER_SITE_OTHER): Payer: BC Managed Care – PPO | Admitting: Obstetrics and Gynecology

## 2016-06-03 VITALS — BP 110/60 | HR 68 | Resp 16 | Wt 169.0 lb

## 2016-06-03 DIAGNOSIS — R87622 Low grade squamous intraepithelial lesion on cytologic smear of vagina (LGSIL): Secondary | ICD-10-CM | POA: Diagnosis not present

## 2016-06-03 DIAGNOSIS — R87811 Vaginal high risk human papillomavirus (HPV) DNA test positive: Secondary | ICD-10-CM

## 2016-06-03 NOTE — Patient Instructions (Signed)

## 2016-06-03 NOTE — Progress Notes (Signed)
GYNECOLOGY  VISIT   HPI: 51 y.o.   Married  Caucasian  female   [phone] with No LMP recorded. Patient has had a hysterectomy.   here for a colposcopy, she has a h/o abnormal paps. Recent LSIL pap with +HPV. Vaginal biopsies from last year with hpv effect, but no dysplasia   GYNECOLOGIC HISTORY: No LMP recorded. Patient has had a hysterectomy. Contraception: hysterectomy  Menopausal hormone therapy: none        OB History    Gravida Para Term Preterm AB Living   3 2 2  0 1 2   SAB TAB Ectopic Multiple Live Births   1 0 0   2         Patient Active Problem List   Diagnosis Date Noted  . Chronic pain of right knee 05/21/2016  . Hamstring strain, sequela 05/21/2016  . Wart viral 05/12/2016  . Contusion of knee 05/05/2016  . Fall involving sidewalk curb 05/05/2016  . Aphasia 03/13/2016  . Shoulder pain, bilateral 12/11/2015  . Fibromyalgia 12/11/2015  . Post concussion syndrome 11/01/2015  . Clavicle fracture 10/30/2015  . Hair loss 10/11/2015  . Pain in joint, shoulder region 09/11/2015  . Right wrist pain 08/07/2015  . Splinter in skin 07/30/2015  . Fall from ladder 07/30/2015  . Skull fracture with concussion ([place]) 07/30/2015  . Fracture of cervical vertebra, C6 ([place]) 07/30/2015  . Thoracic vertebral fracture ([place]) 07/30/2015  . Abdominal pain, epigastric 05/07/2015  . Scaly patch rash 02/26/2015  . Mitral valve regurgitation 09/27/2014  . PAT (paroxysmal atrial tachycardia) ([place]) 09/06/2014  . Tachycardia 07/31/2014  . Sprain of ankle 06/29/2014  . Earache, left 05/15/2013  . Dizziness 05/15/2013  . Asthmatic bronchitis 05/08/2013  . Benign paroxysmal positional vertigo 05/08/2013  . Chest wall pain 01/26/2013  . LUQ abdominal pain 01/20/2013  . Insomnia 01/10/2013  . Depression (emotion) 01/10/2013  . URI, acute 08/25/2012  . Snoring 08/25/2012  . IBS (irritable bowel syndrome) 02/08/2012  . Multiple pulmonary nodules 02/08/2012  . Chest heaviness 11/16/2010   . GERD 05/24/2010  . SINUSITIS, MAXILLARY, CHRONIC 05/20/2010  . Myalgia and myositis 11/20/2009  . Fatigue 11/20/2009  . ARTHRALGIA 06/26/2009  . Cervicogenic headache 06/26/2009  . TOBACCO USE, QUIT 06/26/2009  . LOW BACK PAIN 02/20/2009  . Rosacea 12/27/2008  . PRURITUS 12/27/2008  . Allergic urticaria 12/27/2008  . B12 deficiency 01/02/2008    Past Medical History:  Diagnosis Date  . Allergic urticaria   . ARTHRALGIA   . Arthritis   . Atrial fibrillation ([place])   . B12 DEFICIENCY   . CFS (chronic fatigue syndrome)   . Endometriosis   . Epidural hemorrhage with loss of consciousness ([place]) 06/29/15  . Fibroid   . Fibromyalgia   . Fracture of cervical vertebra, C6 ([place]) 06/29/15  . Gastritis   . GERD   . Heart murmur   . History of endometriosis   . IBS (irritable bowel syndrome)   . Internal hemorrhoids   . MVP (mitral valve prolapse)   . Osteoarthritis   . Rosacea   . Thoracic compression fracture ([place]) 06/29/15   T2-T6    Past Surgical History:  Procedure Laterality Date  . ABDOMINAL HYSTERECTOMY     partial  . ABDOMINAL HYSTERECTOMY    . ANAL FISSURE REPAIR     Dr Rise [name] 2009  . APPENDECTOMY    . COLPOSCOPY    . HEMORRHOID SURGERY     Dr. Rise [name] 2009  . OVARIAN CYST REMOVAL    .  RT BUNIONECTOMT    . TONSILLECTOMY AND ADENOIDECTOMY    . TUBAL LIGATION    . UTERINE FIBROID SURGERY      Current Outpatient Prescriptions  Medication Sig Dispense Refill  . CASCARA SAGRADA PO Take by mouth.    . Cholecalciferol (GNP VITAMIN D) 1000 units tablet Take 2 tablets (2,000 Units total) by mouth daily. 100 tablet 3  . cyanocobalamin (,VITAMIN B-12,) 1000 MCG/ML injection Inject 1 mL (1,000 mcg total) into the skin every 14 (fourteen) days. 10 mL 11  . ergocalciferol (VITAMIN D2) 50000 units capsule Take 1 capsule (50,000 Units total) by mouth once a week. 6 capsule 0  . fluocinonide (LIDEX) 0.05 % external solution     . hydrocortisone 2.5 % cream Apply  topically 2 (two) times daily. 40 g 1  . Phosphatidylserine-DHA-EPA (VAYACOG) 100-19.5-6.5 MG CAPS Take 1 capsule by mouth daily. 30 capsule 11  . SYRINGE-NEEDLE, DISP, 3 ML (BD ECLIPSE SYRINGE) 25G X 1" 3 ML MISC Use IM 50 each 11  . Turmeric 500 MG TABS Take by mouth 2 (two) times daily.    Marland Kitchen amitriptyline (ELAVIL) 10 MG tablet TAKE 1 TABLET (10 MG TOTAL) BY MOUTH AT BEDTIME.  5   No current facility-administered medications for this visit.      ALLERGIES: Indocin [indomethacin]; Morphine sulfate; Sulfacetamide sodium; Caffeine; Morphine and related; Sulfa antibiotics; Azithromycin; and Erythromycin stearate  Family History  Problem Relation Age of Onset  . Lung cancer Mother   . Thyroid cancer Mother   . Cancer Mother     INTESTINAL  . Healthy Father   . Diabetes      uncles/aunts  . Von Willebrand disease Daughter   . Asthma Neg Hx     Social History   Social History  . Marital status: Married    Spouse name: N/A  . Number of children: N/A  . Years of education: N/A   Occupational History  . Not on file.   Social History Main Topics  . Smoking status: Former Smoker    Quit date: 10/31/1988  . Smokeless tobacco: Never Used  . Alcohol use 0.6 - 1.2 oz/week    1 - 2 Standard drinks or equivalent per week     Comment: occasional   . Drug use: No  . Sexual activity: Yes    Partners: Male   Other Topics Concern  . Not on file   Social History Narrative   ** Merged History Encounter **       Working 2 jobs, 6d/wk      Regular exercise-no      Divorced    Review of Systems  Constitutional: Negative.   HENT: Negative.   Eyes: Negative.   Respiratory: Negative.   Cardiovascular: Negative.   Gastrointestinal: Negative.   Genitourinary: Negative.   Musculoskeletal: Negative.   Skin: Negative.   Neurological: Negative.   Endo/Heme/Allergies: Negative.   Psychiatric/Behavioral: Negative.     PHYSICAL EXAMINATION:    BP 110/60 (BP Location: Right Arm,  Patient Position: Sitting, Cuff Size: Normal)   Pulse 68   Resp 16   Wt 169 lb (76.7 kg)   BMI 29.01 kg/m     General appearance: alert, cooperative and appears stated age  Pelvic: External genitalia:  no lesions              Urethra:  normal appearing urethra with no masses, tenderness or lesions  Bartholins and Skenes: normal                 Vagina: normal appearing vagina with normal color and discharge, no lesions              Cervix: absent  Colposcopy of entire vagina, no aceto-white changes, area anteriorly with decreased lugols uptake. Biopsy of the anterior vaginal wall at one o'clock in the upper vagina. No decreased lugols uptake noted on the lateral or posterior vagina  Chaperone was present for exam.  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 Discussed treatment only for high grade vaginal dysplasia Discussed treatment options and that there are potential risks   An After Visit Summary was printed and given to the patient.

## 2016-06-03 NOTE — Addendum Note (Signed)
Addended by: Dorothy Spark on: 06/03/2016 01:39 PM   Modules accepted: Orders

## 2016-06-04 ENCOUNTER — Ambulatory Visit: Payer: BC Managed Care – PPO | Admitting: Physical Therapy

## 2016-06-04 DIAGNOSIS — M6281 Muscle weakness (generalized): Secondary | ICD-10-CM

## 2016-06-04 DIAGNOSIS — G4489 Other headache syndrome: Secondary | ICD-10-CM

## 2016-06-04 DIAGNOSIS — M62838 Other muscle spasm: Secondary | ICD-10-CM

## 2016-06-04 DIAGNOSIS — M546 Pain in thoracic spine: Secondary | ICD-10-CM

## 2016-06-04 NOTE — Therapy (Signed)
[facility] [address] [place], [place], [address] Phone: [phone]   Fax:  [phone]  Physical Therapy Treatment  Patient Details  Name: Regina [name] MRN: [id number] Date of Birth: 1966-02-22 Referring Provider: Dr. Lew Dawes  Encounter Date: 06/04/2016      PT End of Session - 06/04/16 1706    Visit Number 17   Date for PT Re-Evaluation 06/24/16   PT Start Time 7253   PT Stop Time 1105   PT Time Calculation (min) 50 min   Activity Tolerance Patient tolerated treatment well      Past Medical History:  Diagnosis Date  . Allergic urticaria   . ARTHRALGIA   . Arthritis   . Atrial fibrillation ([place])   . B12 DEFICIENCY   . CFS (chronic fatigue syndrome)   . Endometriosis   . Epidural hemorrhage with loss of consciousness ([place]) 06/29/15  . Fibroid   . Fibromyalgia   . Fracture of cervical vertebra, C6 ([place]) 06/29/15  . Gastritis   . GERD   . Heart murmur   . History of endometriosis   . IBS (irritable bowel syndrome)   . Internal hemorrhoids   . MVP (mitral valve prolapse)   . Osteoarthritis   . Rosacea   . Thoracic compression fracture ([place]) 06/29/15   T2-T6    Past Surgical History:  Procedure Laterality Date  . ABDOMINAL HYSTERECTOMY     partial  . ABDOMINAL HYSTERECTOMY    . ANAL FISSURE REPAIR     Dr Rise [name] 2009  . APPENDECTOMY    . COLPOSCOPY    . HEMORRHOID SURGERY     Dr. Rise [name] 2009  . OVARIAN CYST REMOVAL    . RT BUNIONECTOMT    . TONSILLECTOMY AND ADENOIDECTOMY    . TUBAL LIGATION    . UTERINE FIBROID SURGERY      There were no vitals filed for this visit.      Subjective Assessment - 06/04/16 1024    Subjective I think the needling helps but I need a couple of weeks in between to recover.  The pain is not too bad today.  Left neck pain is mild.  Therapy has helped me so much.  I do heat and tennis balls to help with therapy every night.     Pertinent History  fibromyalgia   Currently in Pain? Yes   Pain Score 2    Pain Location Neck   Pain Orientation Left   Pain Type Chronic pain   Pain Onset More than a month ago   Aggravating Factors  turning right;  heat; e-stim;  dry needling            [facility] PT Assessment - 06/04/16 0001      AROM   Cervical Flexion 50   Cervical Extension 60   Cervical - Right Side Bend 46   Cervical - Left Side Bend 40   Cervical - Right Rotation 37   Cervical - Left Rotation 42   Thoracic - Right Rotation decreased by 25%   Thoracic - Left Rotation decreased by 25%                     [facility] Adult PT Treatment/Exercise - 06/04/16 0001      Neck Exercises: Machines for Strengthening   UBE (Upper Arm Bike) Level 1 x 8 minutes (3/3)  PT present to discuss progress     Neck Exercises: Seated  Other Seated Exercise discussion of importance of home stretching and postural strengthening for improved long term prognosisi     Moist Heat Therapy   Number Minutes Moist Heat 15 Minutes   Moist Heat Location Cervical     Electrical Stimulation   Electrical Stimulation Location bil neck/upper trap   Electrical Stimulation Action IFC   Electrical Stimulation Parameters 15 min   Electrical Stimulation Goals Pain     Manual Therapy   Soft tissue mobilization bilateral cervical paraspinals, upper traps and bilateral rhomboids          Trigger Point Dry Needling - 06/04/16 1038    Consent Given? Yes   Muscles Treated Upper Body Rhomboids  bilateral cervical multifidi    Upper Trapezius Response Twitch reponse elicited;Palpable increased muscle length   Rhomboids Response Twitch response elicited;Palpable increased muscle length                PT Short Term Goals - 06/04/16 1711      PT SHORT TERM GOAL #1   Title independent with initial HEP   Status Achieved     PT SHORT TERM GOAL #2   Title headaches decreased in frequency by 25%   Status Achieved     PT SHORT TERM GOAL  #3   Title ability to turn her head with pain decreased >/= 25% due to increase tissue mobility   Status Achieved           PT Long Term Goals - 06/04/16 1711      PT LONG TERM GOAL #1   Title independent with HEP and understand how to progress herself   Time 12   Period Weeks   Status On-going     PT LONG TERM GOAL #2   Title ability to look upward into a cabinet with >/= 50% greater ease due to increase mobility and decreased pain.   Status Achieved     PT LONG TERM GOAL #4   Title ability to vacuum, clean dishes and cook with >/= 50% greater ease due to increased strength and decreased pain   Time 12   Period Weeks   Status On-going     PT LONG TERM GOAL #5   Title FOTO score </= 45% limitation   Time 12   Period Weeks   Status On-going               Plan - 06/04/16 1707    Clinical Impression Statement The patient is receptive to dry needling of neck and periscapular muscles despite sensitivity during the intervention, she feels she has a good outcome for 2 weeks following.  She has typical referred pain pattern with right upper trap trigger point referring pain and tingling into her head.   She has improvement in cervical extension and sidebending ROM and she reports functionally she is able to turn her head easier.  Progressing with long term rehab goals.     PT Next Visit Plan postural strengthening;  cervical rotation ROM      Patient will benefit from skilled therapeutic intervention in order to improve the following deficits and impairments:     Visit Diagnosis: Pain in thoracic spine  Other muscle spasm  Other headache syndrome  Muscle weakness (generalized)     Problem List Patient Active Problem List   Diagnosis Date Noted  . Chronic pain of right knee 05/21/2016  . Hamstring strain, sequela 05/21/2016  . Wart viral 05/12/2016  . Contusion of knee 05/05/2016  .  Fall involving sidewalk curb 05/05/2016  . Aphasia 03/13/2016  . Shoulder  pain, bilateral 12/11/2015  . Fibromyalgia 12/11/2015  . Post concussion syndrome 11/01/2015  . Clavicle fracture 10/30/2015  . Hair loss 10/11/2015  . Pain in joint, shoulder region 09/11/2015  . Right wrist pain 08/07/2015  . Splinter in skin 07/30/2015  . Fall from ladder 07/30/2015  . Skull fracture with concussion ([place]) 07/30/2015  . Fracture of cervical vertebra, C6 ([place]) 07/30/2015  . Thoracic vertebral fracture ([place]) 07/30/2015  . Abdominal pain, epigastric 05/07/2015  . Scaly patch rash 02/26/2015  . Mitral valve regurgitation 09/27/2014  . PAT (paroxysmal atrial tachycardia) ([place]) 09/06/2014  . Tachycardia 07/31/2014  . Sprain of ankle 06/29/2014  . Earache, left 05/15/2013  . Dizziness 05/15/2013  . Asthmatic bronchitis 05/08/2013  . Benign paroxysmal positional vertigo 05/08/2013  . Chest wall pain 01/26/2013  . LUQ abdominal pain 01/20/2013  . Insomnia 01/10/2013  . Depression (emotion) 01/10/2013  . URI, acute 08/25/2012  . Snoring 08/25/2012  . IBS (irritable bowel syndrome) 02/08/2012  . Multiple pulmonary nodules 02/08/2012  . Chest heaviness 11/16/2010  . GERD 05/24/2010  . SINUSITIS, MAXILLARY, CHRONIC 05/20/2010  . Myalgia and myositis 11/20/2009  . Fatigue 11/20/2009  . ARTHRALGIA 06/26/2009  . Cervicogenic headache 06/26/2009  . TOBACCO USE, QUIT 06/26/2009  . LOW BACK PAIN 02/20/2009  . Rosacea 12/27/2008  . PRURITUS 12/27/2008  . Allergic urticaria 12/27/2008  . B12 deficiency 01/02/2008   Ruben Im, PT 06/04/16 5:13 PM Phone: [phone] Fax: [phone] Alvera Singh 06/04/2016, 5:12 PM  [facility] [address] [place], [place], [address] Phone: [phone]   Fax:  [phone]  Name: ROSHA COCKER MRN: [id number] Date of Birth: Sep 11, 1965

## 2016-06-08 ENCOUNTER — Ambulatory Visit: Payer: BC Managed Care – PPO | Admitting: Physical Therapy

## 2016-06-08 ENCOUNTER — Encounter: Payer: Self-pay | Admitting: Physical Therapy

## 2016-06-08 DIAGNOSIS — M62838 Other muscle spasm: Secondary | ICD-10-CM

## 2016-06-08 DIAGNOSIS — M546 Pain in thoracic spine: Secondary | ICD-10-CM | POA: Diagnosis not present

## 2016-06-08 DIAGNOSIS — M6281 Muscle weakness (generalized): Secondary | ICD-10-CM

## 2016-06-08 DIAGNOSIS — G4489 Other headache syndrome: Secondary | ICD-10-CM

## 2016-06-08 NOTE — Therapy (Signed)
[facility] [address] [place], [place], [address] Phone: [phone]   Fax:  [phone]  Physical Therapy Treatment  Patient Details  Name: Regina [name] MRN: [id number] Date of Birth: 03/25/1966 Referring Provider: Dr. Lew Dawes  Encounter Date: 06/08/2016      PT End of Session - 06/08/16 0942    Visit Number 18   Date for PT Re-Evaluation 06/24/16   PT Start Time 0935   PT Stop Time 1032   PT Time Calculation (min) 57 min   Activity Tolerance Patient tolerated treatment well   Behavior During Therapy [facility] for tasks assessed/performed      Past Medical History:  Diagnosis Date  . Allergic urticaria   . ARTHRALGIA   . Arthritis   . Atrial fibrillation ([place])   . B12 DEFICIENCY   . CFS (chronic fatigue syndrome)   . Endometriosis   . Epidural hemorrhage with loss of consciousness ([place]) 06/29/15  . Fibroid   . Fibromyalgia   . Fracture of cervical vertebra, C6 ([place]) 06/29/15  . Gastritis   . GERD   . Heart murmur   . History of endometriosis   . IBS (irritable bowel syndrome)   . Internal hemorrhoids   . MVP (mitral valve prolapse)   . Osteoarthritis   . Rosacea   . Thoracic compression fracture ([place]) 06/29/15   T2-T6    Past Surgical History:  Procedure Laterality Date  . ABDOMINAL HYSTERECTOMY     partial  . ABDOMINAL HYSTERECTOMY    . ANAL FISSURE REPAIR     Dr Rise [name] 2009  . APPENDECTOMY    . COLPOSCOPY    . HEMORRHOID SURGERY     Dr. Rise [name] 2009  . OVARIAN CYST REMOVAL    . RT BUNIONECTOMT    . TONSILLECTOMY AND ADENOIDECTOMY    . TUBAL LIGATION    . UTERINE FIBROID SURGERY      There were no vitals filed for this visit.      Subjective Assessment - 06/08/16 0938    Subjective I think the weather is causing a little pain today.   Pertinent History fibromyalgia   Patient Stated Goals reduce pain, turn neck instead of body   Currently in Pain? Yes   Pain Score 3     Pain Location Neck   Pain Orientation Left   Pain Descriptors / Indicators Tightness;Spasm   Pain Type Chronic pain   Pain Onset More than a month ago   Pain Frequency Constant   Aggravating Factors  dry needling made me sore this weekend   Pain Relieving Factors rest, heat   Multiple Pain Sites No                         [facility] Adult PT Treatment/Exercise - 06/08/16 0001      Neck Exercises: Machines for Strengthening   UBE (Upper Arm Bike) Level 2 x 8 minutes (4/4)  PT present to discuss progress     Neck Exercises: Seated   Shoulder Flexion 20 reps;Both   Shoulder Flexion Weights (lbs) 1   Shoulder ABduction 20 reps;Both   Shoulder Abduction Weights (lbs) 1   Shoulder Abduction Limitations scaption 2x10 1#     Shoulder Exercises: Power Development worker, community 20 reps  15#   Row 20 reps  15#     Moist Heat Therapy   Number Minutes Moist Heat --   Moist Heat Location --  Technical brewer Goals --     Manual Therapy   Manual Therapy Soft tissue mobilization;Myofascial release   Manual therapy comments Pt supine   Soft tissue mobilization Suboccipitals, SCM, and cervical muscles; temporalis   Myofascial Release cervical muscles, suboccipitals, SCM                  PT Short Term Goals - 06/04/16 1711      PT SHORT TERM GOAL #1   Title independent with initial HEP   Status Achieved     PT SHORT TERM GOAL #2   Title headaches decreased in frequency by 25%   Status Achieved     PT SHORT TERM GOAL #3   Title ability to turn her head with pain decreased >/= 25% due to increase tissue mobility   Status Achieved           PT Long Term Goals - 06/08/16 1049      PT LONG TERM GOAL #1   Title independent with HEP and understand how to progress herself   Time 12   Period Weeks   Status  On-going     PT LONG TERM GOAL #4   Title ability to vacuum, clean dishes and cook with >/= 50% greater ease due to increased strength and decreased pain   Time 12   Period Weeks   Status On-going     PT LONG TERM GOAL #5   Title FOTO score </= 45% limitation   Time 12   Period Weeks   Status On-going               Plan - 06/08/16 1039    Clinical Impression Statement Patient able to increase resistence and progressed some of the postural exercises today.  Patient responded well to manual.  She had tension in suboccipitals, cervical paraspinals.     Rehab Potential Good   Clinical Impairments Affecting Rehab Potential Fell on 06/29/2015 resulting with fractured skull, fractured T7, fractured shoulder, had 12 weeks of [facility] therapy   PT Treatment/Interventions Cryotherapy;Electrical Stimulation;Ultrasound;Traction;Moist Heat;Therapeutic activities;Therapeutic exercise;Neuromuscular re-education;Patient/family education;Passive range of motion;Manual techniques;Dry needling;Energy conservation;Taping   PT Next Visit Plan postural strengthening;  cervical rotation ROM, manual on temporalis and masseters if needed next time   Consulted and Agree with Plan of Care Patient      Patient will benefit from skilled therapeutic intervention in order to improve the following deficits and impairments:  Decreased range of motion, Increased fascial restricitons, Decreased endurance, Increased muscle spasms, Pain, Hypomobility, Impaired flexibility, Decreased mobility, Decreased strength  Visit Diagnosis: Pain in thoracic spine  Other muscle spasm  Other headache syndrome  Muscle weakness (generalized)     Problem List Patient Active Problem List   Diagnosis Date Noted  . Chronic pain of right knee 05/21/2016  . Hamstring strain, sequela 05/21/2016  . Wart viral 05/12/2016  . Contusion of knee 05/05/2016  . Fall involving sidewalk curb 05/05/2016  . Aphasia 03/13/2016   . Shoulder pain, bilateral 12/11/2015  . Fibromyalgia 12/11/2015  . Post concussion syndrome 11/01/2015  . Clavicle fracture 10/30/2015  . Hair loss 10/11/2015  . Pain in joint, shoulder region 09/11/2015  . Right wrist pain 08/07/2015  . Splinter in skin 07/30/2015  . Fall from ladder 07/30/2015  . Skull fracture with concussion ([place]) 07/30/2015  . Fracture of cervical vertebra, C6 ([place])  07/30/2015  . Thoracic vertebral fracture ([place]) 07/30/2015  . Abdominal pain, epigastric 05/07/2015  . Scaly patch rash 02/26/2015  . Mitral valve regurgitation 09/27/2014  . PAT (paroxysmal atrial tachycardia) ([place]) 09/06/2014  . Tachycardia 07/31/2014  . Sprain of ankle 06/29/2014  . Earache, left 05/15/2013  . Dizziness 05/15/2013  . Asthmatic bronchitis 05/08/2013  . Benign paroxysmal positional vertigo 05/08/2013  . Chest wall pain 01/26/2013  . LUQ abdominal pain 01/20/2013  . Insomnia 01/10/2013  . Depression (emotion) 01/10/2013  . URI, acute 08/25/2012  . Snoring 08/25/2012  . IBS (irritable bowel syndrome) 02/08/2012  . Multiple pulmonary nodules 02/08/2012  . Chest heaviness 11/16/2010  . GERD 05/24/2010  . SINUSITIS, MAXILLARY, CHRONIC 05/20/2010  . Myalgia and myositis 11/20/2009  . Fatigue 11/20/2009  . ARTHRALGIA 06/26/2009  . Cervicogenic headache 06/26/2009  . TOBACCO USE, QUIT 06/26/2009  . LOW BACK PAIN 02/20/2009  . Rosacea 12/27/2008  . PRURITUS 12/27/2008  . Allergic urticaria 12/27/2008  . B12 deficiency 01/02/2008    Zannie Cove, PT 06/08/2016, 10:55 AM  [facility] [address] [place], [place], [address] Phone: [phone]   Fax:  [phone]  Name: LEONOR DARNELL MRN: [id number] Date of Birth: 12-27-1965

## 2016-06-09 ENCOUNTER — Encounter: Payer: Self-pay | Admitting: Internal Medicine

## 2016-06-09 ENCOUNTER — Ambulatory Visit (INDEPENDENT_AMBULATORY_CARE_PROVIDER_SITE_OTHER): Payer: BC Managed Care – PPO | Admitting: Internal Medicine

## 2016-06-09 DIAGNOSIS — F0781 Postconcussional syndrome: Secondary | ICD-10-CM

## 2016-06-09 DIAGNOSIS — S060X9S Concussion with loss of consciousness of unspecified duration, sequela: Secondary | ICD-10-CM

## 2016-06-09 DIAGNOSIS — M5441 Lumbago with sciatica, right side: Secondary | ICD-10-CM

## 2016-06-09 DIAGNOSIS — R4701 Aphasia: Secondary | ICD-10-CM

## 2016-06-09 DIAGNOSIS — M25561 Pain in right knee: Secondary | ICD-10-CM | POA: Diagnosis not present

## 2016-06-09 DIAGNOSIS — J069 Acute upper respiratory infection, unspecified: Secondary | ICD-10-CM

## 2016-06-09 DIAGNOSIS — E538 Deficiency of other specified B group vitamins: Secondary | ICD-10-CM

## 2016-06-09 DIAGNOSIS — G8929 Other chronic pain: Secondary | ICD-10-CM

## 2016-06-09 DIAGNOSIS — S0291XS Unspecified fracture of skull, sequela: Secondary | ICD-10-CM

## 2016-06-09 MED ORDER — CYANOCOBALAMIN 1000 MCG/ML IJ SOLN
1000.0000 ug | Freq: Once | INTRAMUSCULAR | Status: AC
  Administered 2016-06-09: 1000 ug via INTRAMUSCULAR

## 2016-06-09 MED ORDER — CEFDINIR 300 MG PO CAPS: 300.0000 mg | ORAL_CAPSULE | Freq: Two times a day (BID) | ORAL | 0 refills | Status: DC

## 2016-06-09 NOTE — Assessment & Plan Note (Signed)
Cont w/PT 

## 2016-06-09 NOTE — Progress Notes (Signed)
Pre-visit discussion using our clinic review tool. No additional management support is needed unless otherwise documented below in the visit note.  

## 2016-06-09 NOTE — Assessment & Plan Note (Addendum)
Speech therapy - completed in 3/18

## 2016-06-09 NOTE — Assessment & Plan Note (Signed)
On B12 

## 2016-06-09 NOTE — Progress Notes (Signed)
Subjective:  Patient ID: Regina [name], female    DOB: 05-Mar-1966  Age: 51 y.o. MRN: [id number]  CC: Follow-up (Concussion syndrome not stable, sinus pressure, pain in lower right back, pain back of knee )   HPI Regina [name] presents for PCS, LBP, knee pain f/u C/o sinus congestion x 4 d, lost voice x 1 d. C/o green d/c Pt wants to go back to work  Outpatient Medications Prior to Visit  Medication Sig Dispense Refill  . amitriptyline (ELAVIL) 10 MG tablet TAKE 1 TABLET (10 MG TOTAL) BY MOUTH AT BEDTIME.  5  . CASCARA SAGRADA PO Take by mouth.    . Cholecalciferol (GNP VITAMIN D) 1000 units tablet Take 2 tablets (2,000 Units total) by mouth daily. 100 tablet 3  . cyanocobalamin (,VITAMIN B-12,) 1000 MCG/ML injection Inject 1 mL (1,000 mcg total) into the skin every 14 (fourteen) days. 10 mL 11  . ergocalciferol (VITAMIN D2) 50000 units capsule Take 1 capsule (50,000 Units total) by mouth once a week. 6 capsule 0  . fluocinonide (LIDEX) 0.05 % external solution     . hydrocortisone 2.5 % cream Apply topically 2 (two) times daily. 40 g 1  . Phosphatidylserine-DHA-EPA (VAYACOG) 100-19.5-6.5 MG CAPS Take 1 capsule by mouth daily. 30 capsule 11  . SYRINGE-NEEDLE, DISP, 3 ML (BD ECLIPSE SYRINGE) 25G X 1" 3 ML MISC Use IM 50 each 11  . Turmeric 500 MG TABS Take by mouth 2 (two) times daily.     No facility-administered medications prior to visit.     ROS Review of Systems  Constitutional: Positive for fatigue. Negative for activity change, appetite change, chills and unexpected weight change.  HENT: Negative for congestion, mouth sores and sinus pressure.   Eyes: Negative for visual disturbance.  Respiratory: Negative for cough and chest tightness.   Gastrointestinal: Negative for abdominal pain and nausea.  Genitourinary: Negative for difficulty urinating, frequency and vaginal pain.  Musculoskeletal: Positive for arthralgias, back pain, neck pain and neck stiffness. Negative for  gait problem.  Skin: Negative for pallor and rash.  Neurological: Positive for dizziness. Negative for tremors, weakness, numbness and headaches.  Psychiatric/Behavioral: Positive for decreased concentration. Negative for confusion, sleep disturbance and suicidal ideas.    Objective:  BP 106/64   Pulse 76   Temp 98.7 F (37.1 C) (Oral)   Resp 16   Ht 5\' 4"  (1.626 m)   Wt 170 lb (77.1 kg)   SpO2 96%   BMI 29.18 kg/m   BP Readings from Last 3 Encounters:  06/09/16 106/64  06/03/16 110/60  05/21/16 110/78    Wt Readings from Last 3 Encounters:  06/09/16 170 lb (77.1 kg)  06/03/16 169 lb (76.7 kg)  05/21/16 170 lb (77.1 kg)    Physical Exam  Constitutional: She appears well-developed. No distress.  HENT:  Head: Normocephalic.  Right Ear: External ear normal.  Left Ear: External ear normal.  Nose: Nose normal.  Mouth/Throat: Oropharynx is clear and moist.  Eyes: Conjunctivae are normal. Pupils are equal, round, and reactive to light. Right eye exhibits no discharge. Left eye exhibits no discharge.  Neck: Normal range of motion. Neck supple. No JVD present. No tracheal deviation present. No thyromegaly present.  Cardiovascular: Normal rate, regular rhythm and normal heart sounds.   Pulmonary/Chest: No stridor. No respiratory distress. She has no wheezes.  Abdominal: Soft. Bowel sounds are normal. She exhibits no distension and no mass. There is no tenderness. There is no rebound and no  guarding.  Musculoskeletal: She exhibits no edema or tenderness.  Lymphadenopathy:    She has no cervical adenopathy.  Neurological: She displays normal reflexes. No cranial nerve deficit. She exhibits normal muscle tone. Coordination abnormal.  Skin: No rash noted. No erythema.  Psychiatric: Her behavior is normal. Judgment and thought content normal.    Lab Results  Component Value Date   WBC 7.2 09/11/2015   HGB 13.4 09/11/2015   HCT 39.4 09/11/2015   PLT 385.0 09/11/2015   GLUCOSE  106 (H) 09/11/2015   CHOL 201 (H) 02/26/2015   TRIG 65.0 02/26/2015   HDL 62.10 02/26/2015   LDLCALC 126 (H) 02/26/2015   ALT 18 09/11/2015   AST 19 09/11/2015   NA 138 09/11/2015   K 3.5 09/11/2015   CL 101 09/11/2015   CREATININE 0.62 09/11/2015   BUN 11 09/11/2015   CO2 29 09/11/2015   TSH 1.19 09/11/2015   INR 1.16 06/29/2015    Mr Wrist Right W Contrast  Result Date: 12/06/2015 CLINICAL DATA:  Right wrist pain status post fall. Numbness and weakness. Limited range of motion. EXAM: MRI OF THE RIGHT WRIST WITH CONTRAST(MR Arthrogram) TECHNIQUE: Multiplanar, multisequence MR imaging of the wrist was performed immediately following contrast injection into the radiocarpal joint under fluoroscopic guidance. No intravenous contrast was administered. COMPARISON:  None. FINDINGS: Ligaments: Intact scapholunate and lunotriquetral ligaments. Triangular fibrocartilage: Intact. Tendons: Intact flexor and extensor compartment tendons. Carpal tunnel/median nerve: Normal. Guyon's canal: Normal. Joint/cartilage: Intraarticular contrast in the radiocarpal joint distending the joint capsule. No chondral defect. No intra-articular loose body. Bones/carpal alignment: No marrow signal abnormality. No fracture or dislocation. Normal alignment. Other: No fluid collection or hematoma. IMPRESSION: 1. No internal derangement of the right wrist. Electronically Signed   By: Kathreen Devoid   On: 12/06/2015 17:08   Dg Fluoro Guided Needle Plc Aspiration/injection Loc  Result Date: 12/06/2015 CLINICAL DATA:  Right wrist pain.  20 foot fall 5 months ago. FLUOROSCOPY TIME:  Radiation Exposure Index (as provided by the fluoroscopic device): 0.93 uGy*m2 Fluoroscopy Time:  18 seconds Number of Acquired Images:  0 PROCEDURE: Right WRIST INJECTION UNDER FLUOROSCOPY An appropriate skin entrance site was determined. The site was marked, prepped with Betadine, draped in the usual sterile fashion, and infiltrated locally with 1%  Lidocaine. A 25 gauge skin needle was advanced into the radiocarpal joint under intermittent fluoroscopy. 0.1 mL MultiHance was diluted and 20 mL of Isovue M 200. This was then used to opacify the proximal carpal joint. No immediate complication. IMPRESSION: Technically successful right wrist injection for MRI. Electronically Signed   By: San Morelle M.D.   On: 12/06/2015 16:18    Assessment & Plan:   There are no diagnoses linked to this encounter. I am having Ms. Beckley maintain her SYRINGE-NEEDLE (DISP) 3 ML, cyanocobalamin, CASCARA SAGRADA PO, Phosphatidylserine-DHA-EPA, Cholecalciferol, Turmeric, hydrocortisone, ergocalciferol, fluocinonide, and amitriptyline.  No orders of the defined types were placed in this encounter.    Follow-up: No Follow-up on file.  Walker Kehr, MD

## 2016-06-09 NOTE — Assessment & Plan Note (Addendum)
[employer]

## 2016-06-09 NOTE — Assessment & Plan Note (Signed)
Fell 4 weeks ago 2/18 - contusion, Baker cyst

## 2016-06-09 NOTE — Patient Instructions (Addendum)
[facility]enzie back pillow Spiky balls Foam roller

## 2016-06-09 NOTE — Assessment & Plan Note (Signed)
ST finished

## 2016-06-09 NOTE — Assessment & Plan Note (Signed)
OK to back to work full time

## 2016-06-12 ENCOUNTER — Other Ambulatory Visit: Payer: Self-pay | Admitting: Obstetrics and Gynecology

## 2016-06-12 DIAGNOSIS — Z1231 Encounter for screening mammogram for malignant neoplasm of breast: Secondary | ICD-10-CM

## 2016-06-15 ENCOUNTER — Encounter: Payer: Self-pay | Admitting: Physical Therapy

## 2016-06-15 ENCOUNTER — Ambulatory Visit: Payer: BC Managed Care – PPO | Admitting: Physical Therapy

## 2016-06-15 DIAGNOSIS — M62838 Other muscle spasm: Secondary | ICD-10-CM

## 2016-06-15 DIAGNOSIS — M546 Pain in thoracic spine: Secondary | ICD-10-CM

## 2016-06-15 DIAGNOSIS — G4489 Other headache syndrome: Secondary | ICD-10-CM

## 2016-06-15 DIAGNOSIS — M6281 Muscle weakness (generalized): Secondary | ICD-10-CM

## 2016-06-15 DIAGNOSIS — R41841 Cognitive communication deficit: Secondary | ICD-10-CM

## 2016-06-15 NOTE — Therapy (Signed)
[facility] [address], [place], [place], [address] Phone: [phone]   Fax:  [phone]  Physical Therapy Treatment  Patient Details  Name: Regina [name] MRN: [id number] Date of Birth: September 12, 1965 Referring Provider: Dr. Lew Dawes  Encounter Date: 06/15/2016      PT End of Session - 06/15/16 1542    Visit Number 19   Date for PT Re-Evaluation 06/24/16   PT Start Time 1534   PT Stop Time 1630   PT Time Calculation (min) 56 min   Activity Tolerance Patient tolerated treatment well   Behavior During Therapy [facility] for tasks assessed/performed      Past Medical History:  Diagnosis Date  . Allergic urticaria   . ARTHRALGIA   . Arthritis   . Atrial fibrillation ([place])   . B12 DEFICIENCY   . CFS (chronic fatigue syndrome)   . Endometriosis   . Epidural hemorrhage with loss of consciousness ([place]) 06/29/15  . Fibroid   . Fibromyalgia   . Fracture of cervical vertebra, C6 ([place]) 06/29/15  . Gastritis   . GERD   . Heart murmur   . History of endometriosis   . IBS (irritable bowel syndrome)   . Internal hemorrhoids   . MVP (mitral valve prolapse)   . Osteoarthritis   . Rosacea   . Thoracic compression fracture ([place]) 06/29/15   T2-T6    Past Surgical History:  Procedure Laterality Date  . ABDOMINAL HYSTERECTOMY     partial  . ABDOMINAL HYSTERECTOMY    . ANAL FISSURE REPAIR     Dr Rise [name] 2009  . APPENDECTOMY    . COLPOSCOPY    . HEMORRHOID SURGERY     Dr. Rise [name] 2009  . OVARIAN CYST REMOVAL    . RT BUNIONECTOMT    . TONSILLECTOMY AND ADENOIDECTOMY    . TUBAL LIGATION    . UTERINE FIBROID SURGERY      There were no vitals filed for this visit.      Subjective Assessment - 06/15/16 1539    Subjective Neck feeling about the same. Last session was good. Feeling sore though. Pt reports having a bad catch in Rt hip today.    Pertinent History fibromyalgia   Patient Stated Goals reduce pain,  turn neck instead of body   Currently in Pain? Yes   Pain Score 3    Pain Location Neck   Pain Orientation Left   Pain Descriptors / Indicators Spasm;Tightness   Pain Type Chronic pain   Pain Onset More than a month ago   Pain Frequency Constant   Aggravating Factors  dry needling made me more sore this week   Pain Relieving Factors rest, heat                         [facility] Adult PT Treatment/Exercise - 06/15/16 0001      Neck Exercises: Machines for Strengthening   UBE (Upper Arm Bike) Level 2 x 8 minutes (4/4)  PT present to discuss progress     Neck Exercises: Seated   Shoulder Flexion 20 reps;Both  Sitting on ball   Shoulder ABduction 20 reps;Both  Sitting on ball   Shoulder Abduction Limitations Scaption 2x10 #1  Sitting on ball     Shoulder Exercises: Power Development worker, community 20 reps  #20   Row 20 reps  #20     Modalities   Modalities Electrical Stimulation;Moist Heat  Moist Heat Therapy   Number Minutes Moist Heat 15 Minutes   Moist Heat Location Cervical     Electrical Stimulation   Electrical Stimulation Location bil neck/upper trap   Electrical Stimulation Action IFC   Electrical Stimulation Parameters 7 ma   Electrical Stimulation Goals Pain     Manual Therapy   Manual Therapy Soft tissue mobilization;Myofascial release   Manual therapy comments Pt supine   Soft tissue mobilization Suboccipitals, SCM, and cervical muscles; temporalis   Myofascial Release cervical muscles, suboccipitals, SCM                  PT Short Term Goals - 06/04/16 1711      PT SHORT TERM GOAL #1   Title independent with initial HEP   Status Achieved     PT SHORT TERM GOAL #2   Title headaches decreased in frequency by 25%   Status Achieved     PT SHORT TERM GOAL #3   Title ability to turn her head with pain decreased >/= 25% due to increase tissue mobility   Status Achieved           PT Long Term Goals - 06/15/16 1542      PT LONG  TERM GOAL #1   Title independent with HEP and understand how to progress herself   Time 12   Period Weeks   Status On-going     PT LONG TERM GOAL #2   Title ability to look upward into a cabinet with >/= 50% greater ease due to increase mobility and decreased pain.   Time 12   Period Weeks   Status Achieved     PT LONG TERM GOAL #4   Title ability to vacuum, clean dishes and cook with >/= 50% greater ease due to increased strength and decreased pain   Time 12   Period Weeks   Status On-going     PT LONG TERM GOAL #5   Title FOTO score </= 45% limitation   Time 12   Period Weeks   Status On-going             Patient will benefit from skilled therapeutic intervention in order to improve the following deficits and impairments:     Visit Diagnosis: Pain in thoracic spine  Other muscle spasm  Other headache syndrome  Muscle weakness (generalized)  Cognitive communication deficit     Problem List Patient Active Problem List   Diagnosis Date Noted  . Knee pain, right 05/21/2016  . Hamstring strain, sequela 05/21/2016  . Wart viral 05/12/2016  . Contusion of knee 05/05/2016  . Fall involving sidewalk curb 05/05/2016  . Aphasia 03/13/2016  . Shoulder pain, bilateral 12/11/2015  . Fibromyalgia 12/11/2015  . Post concussion syndrome 11/01/2015  . Clavicle fracture 10/30/2015  . Hair loss 10/11/2015  . Pain in joint, shoulder region 09/11/2015  . Right wrist pain 08/07/2015  . Splinter in skin 07/30/2015  . Fall from ladder 07/30/2015  . Skull fracture with concussion ([place]) 07/30/2015  . Fracture of cervical vertebra, C6 ([place]) 07/30/2015  . Thoracic vertebral fracture ([place]) 07/30/2015  . Abdominal pain, epigastric 05/07/2015  . Scaly patch rash 02/26/2015  . Mitral valve regurgitation 09/27/2014  . PAT (paroxysmal atrial tachycardia) ([place]) 09/06/2014  . Tachycardia 07/31/2014  . Sprain of ankle 06/29/2014  . Earache, left 05/15/2013  . Dizziness  05/15/2013  . Asthmatic bronchitis 05/08/2013  . Benign paroxysmal positional vertigo 05/08/2013  . Chest wall pain 01/26/2013  .  LUQ abdominal pain 01/20/2013  . Insomnia 01/10/2013  . Depression (emotion) 01/10/2013  . URI, acute 08/25/2012  . Snoring 08/25/2012  . IBS (irritable bowel syndrome) 02/08/2012  . Multiple pulmonary nodules 02/08/2012  . Chest heaviness 11/16/2010  . GERD 05/24/2010  . SINUSITIS, MAXILLARY, CHRONIC 05/20/2010  . Myalgia and myositis 11/20/2009  . Fatigue 11/20/2009  . ARTHRALGIA 06/26/2009  . Cervicogenic headache 06/26/2009  . TOBACCO USE, QUIT 06/26/2009  . LOW BACK PAIN 02/20/2009  . Rosacea 12/27/2008  . PRURITUS 12/27/2008  . Allergic urticaria 12/27/2008  . B12 deficiency 01/02/2008    Mikle Bosworth PTA 06/15/2016, 5:03 PM  [facility] [address], [place], [place], [address] Phone: [phone]   Fax:  [phone]  Name: Regina [name] MRN: [id number] Date of Birth: 08/18/1965

## 2016-06-18 ENCOUNTER — Encounter: Payer: Self-pay | Admitting: Physical Therapy

## 2016-06-18 ENCOUNTER — Ambulatory Visit: Payer: BC Managed Care – PPO | Admitting: Physical Therapy

## 2016-06-18 DIAGNOSIS — M62838 Other muscle spasm: Secondary | ICD-10-CM

## 2016-06-18 DIAGNOSIS — M6281 Muscle weakness (generalized): Secondary | ICD-10-CM

## 2016-06-18 DIAGNOSIS — M546 Pain in thoracic spine: Secondary | ICD-10-CM | POA: Diagnosis not present

## 2016-06-18 DIAGNOSIS — G4489 Other headache syndrome: Secondary | ICD-10-CM

## 2016-06-18 NOTE — Therapy (Signed)
[facility] [address] [place], [place], [address] Phone: [phone]   Fax:  [phone]  Physical Therapy Treatment  Patient Details  Name: Regina [name] MRN: [id number] Date of Birth: 03/07/1966 Referring Provider: Dr. Lew Dawes  Encounter Date: 06/18/2016      PT End of Session - 06/18/16 2224    Visit Number 20   Date for PT Re-Evaluation 06/24/16   PT Start Time 1530   PT Stop Time 1630   PT Time Calculation (min) 60 min   Activity Tolerance Patient tolerated treatment well   Behavior During Therapy [facility] for tasks assessed/performed      Past Medical History:  Diagnosis Date  . Allergic urticaria   . ARTHRALGIA   . Arthritis   . Atrial fibrillation ([place])   . B12 DEFICIENCY   . CFS (chronic fatigue syndrome)   . Endometriosis   . Epidural hemorrhage with loss of consciousness ([place]) 06/29/15  . Fibroid   . Fibromyalgia   . Fracture of cervical vertebra, C6 ([place]) 06/29/15  . Gastritis   . GERD   . Heart murmur   . History of endometriosis   . IBS (irritable bowel syndrome)   . Internal hemorrhoids   . MVP (mitral valve prolapse)   . Osteoarthritis   . Rosacea   . Thoracic compression fracture ([place]) 06/29/15   T2-T6    Past Surgical History:  Procedure Laterality Date  . ABDOMINAL HYSTERECTOMY     partial  . ABDOMINAL HYSTERECTOMY    . ANAL FISSURE REPAIR     Dr Rise [name] 2009  . APPENDECTOMY    . COLPOSCOPY    . HEMORRHOID SURGERY     Dr. Rise [name] 2009  . OVARIAN CYST REMOVAL    . RT BUNIONECTOMT    . TONSILLECTOMY AND ADENOIDECTOMY    . TUBAL LIGATION    . UTERINE FIBROID SURGERY      There were no vitals filed for this visit.      Subjective Assessment - 06/18/16 1555    Subjective I am having pain in right side of my back and right knee.  I would like to add that eventually to the PT plan.  I like how I am working on my shoulder with my neck due to pain.  I am back at  work and have had a headace for 2 days.    Pertinent History fibromyalgia   Patient Stated Goals reduce pain, turn neck instead of body   Currently in Pain? Yes   Pain Score 3    Pain Location Neck   Pain Orientation Left;Right   Pain Descriptors / Indicators Dull   Pain Type Chronic pain   Pain Onset More than a month ago   Pain Frequency Constant   Aggravating Factors  turning to left,    Pain Relieving Factors rest and heat   Multiple Pain Sites No                         [facility] Adult PT Treatment/Exercise - 06/18/16 0001      Neck Exercises: Machines for Strengthening   UBE (Upper Arm Bike) Level 2 x 8 minutes (4/4)  PT present to discuss progress     Neck Exercises: Seated   Shoulder Flexion 20 reps;Both  Sitting on ball   Shoulder Flexion Weights (lbs) 1   Shoulder ABduction 20 reps;Both  Sitting on ball   Shoulder Abduction Weights (  lbs) 1   Shoulder Abduction Limitations scaption 2x10 bil.   1#     Modalities   Modalities Electrical Stimulation;Moist Heat     Moist Heat Therapy   Number Minutes Moist Heat 15 Minutes   Moist Heat Location Cervical     Electrical Stimulation   Electrical Stimulation Location bil neck/upper trap   Electrical Stimulation Action IFC   Electrical Stimulation Parameters to patient tolerance; 15 min supine   Electrical Stimulation Goals Pain     Manual Therapy   Manual Therapy Soft tissue mobilization   Manual therapy comments Pt supine   Soft tissue mobilization Suboccipitals, SCM, and cervical muscles; temporalis   Myofascial Release cervical muscles, suboccipitals, SCM; anterior cervical musculature                PT Education - 06/18/16 2224    Education provided No          PT Short Term Goals - 06/04/16 1711      PT SHORT TERM GOAL #1   Title independent with initial HEP   Status Achieved     PT SHORT TERM GOAL #2   Title headaches decreased in frequency by 25%   Status Achieved      PT SHORT TERM GOAL #3   Title ability to turn her head with pain decreased >/= 25% due to increase tissue mobility   Status Achieved           PT Long Term Goals - 06/15/16 1542      PT LONG TERM GOAL #1   Title independent with HEP and understand how to progress herself   Time 12   Period Weeks   Status On-going     PT LONG TERM GOAL #2   Title ability to look upward into a cabinet with >/= 50% greater ease due to increase mobility and decreased pain.   Time 12   Period Weeks   Status Achieved     PT LONG TERM GOAL #4   Title ability to vacuum, clean dishes and cook with >/= 50% greater ease due to increased strength and decreased pain   Time 12   Period Weeks   Status On-going     PT LONG TERM GOAL #5   Title FOTO score </= 45% limitation   Time 12   Period Weeks   Status On-going               Plan - 06/18/16 2225    Clinical Impression Statement Patient is feeling better.  Patient is realizing she has other areas from her initial fall that need to be addressed.  Patient is complaining of right shoulder pain that is being addressed with her cervcial deficits Patient is having vertigo and understands she is to attend our neuro department to address this for specialized treatment when she is ready.  Patient reports right knee pain from her initial fall and when she fell missing a step infront of outpatient rehab.  She will have this addressed with her right low back pain after her neck and right shoulder feels better.  Patient is having increased headaches in the past 2 days due to her first two days at work since her initial fall. [name] still has tightness in the cervical region.  Patient is now able to exercise.  Patient only has increased in cervical pain when she rotates to the left.  Patient does have clicking in the right shoulder but no increase in pain.  Patient  will benefit from skilled therapy to improve strength and reduce pain to improve function of the  cervical and right shoulder.    Rehab Potential Good   Clinical Impairments Affecting Rehab Potential Fell on 06/29/2015 resulting with fractured skull, fractured T7, fractured shoulder, had 12 weeks of [facility] therapy   PT Frequency 3x / week   PT Duration 12 weeks   PT Treatment/Interventions Cryotherapy;Electrical Stimulation;Ultrasound;Traction;Moist Heat;Therapeutic activities;Therapeutic exercise;Neuromuscular re-education;Patient/family education;Passive range of motion;Manual techniques;Dry needling;Energy conservation;Taping   PT Next Visit Plan Patient is to be re-evaluated in 2 sessions.  Patient has other areas of pain that will need to be addressed in the future with new scripts for the right low back and right knee, vertigo should be evaluated by our neurology department; continue with postural strengthening, soft tissue work to cervical; dry needling every 3rd session, and modalities as neededd   PT Home Exercise Plan cervical stabilization exercises   Consulted and Agree with Plan of Care Patient      Patient will benefit from skilled therapeutic intervention in order to improve the following deficits and impairments:  Decreased range of motion, Increased fascial restricitons, Decreased endurance, Increased muscle spasms, Pain, Hypomobility, Impaired flexibility, Decreased mobility, Decreased strength  Visit Diagnosis: Other muscle spasm  Other headache syndrome  Muscle weakness (generalized)  Pain in thoracic spine     Problem List Patient Active Problem List   Diagnosis Date Noted  . Knee pain, right 05/21/2016  . Hamstring strain, sequela 05/21/2016  . Wart viral 05/12/2016  . Contusion of knee 05/05/2016  . Fall involving sidewalk curb 05/05/2016  . Aphasia 03/13/2016  . Shoulder pain, bilateral 12/11/2015  . Fibromyalgia 12/11/2015  . Post concussion syndrome 11/01/2015  . Clavicle fracture 10/30/2015  . Hair loss 10/11/2015  . Pain in joint,  shoulder region 09/11/2015  . Right wrist pain 08/07/2015  . Splinter in skin 07/30/2015  . Fall from ladder 07/30/2015  . Skull fracture with concussion ([place]) 07/30/2015  . Fracture of cervical vertebra, C6 ([place]) 07/30/2015  . Thoracic vertebral fracture ([place]) 07/30/2015  . Abdominal pain, epigastric 05/07/2015  . Scaly patch rash 02/26/2015  . Mitral valve regurgitation 09/27/2014  . PAT (paroxysmal atrial tachycardia) ([place]) 09/06/2014  . Tachycardia 07/31/2014  . Sprain of ankle 06/29/2014  . Earache, left 05/15/2013  . Dizziness 05/15/2013  . Asthmatic bronchitis 05/08/2013  . Benign paroxysmal positional vertigo 05/08/2013  . Chest wall pain 01/26/2013  . LUQ abdominal pain 01/20/2013  . Insomnia 01/10/2013  . Depression (emotion) 01/10/2013  . URI, acute 08/25/2012  . Snoring 08/25/2012  . IBS (irritable bowel syndrome) 02/08/2012  . Multiple pulmonary nodules 02/08/2012  . Chest heaviness 11/16/2010  . GERD 05/24/2010  . SINUSITIS, MAXILLARY, CHRONIC 05/20/2010  . Myalgia and myositis 11/20/2009  . Fatigue 11/20/2009  . ARTHRALGIA 06/26/2009  . Cervicogenic headache 06/26/2009  . TOBACCO USE, QUIT 06/26/2009  . LOW BACK PAIN 02/20/2009  . Rosacea 12/27/2008  . PRURITUS 12/27/2008  . Allergic urticaria 12/27/2008  . B12 deficiency 01/02/2008   Earlie Counts, PT 06/18/16 10:36 PM     [facility] [address], [place], [place], [address] Phone: [phone]   Fax:  [phone]  Name: Regina [name] MRN: [id number] Date of Birth: October 22, 1965

## 2016-06-19 ENCOUNTER — Telehealth: Payer: Self-pay | Admitting: Internal Medicine

## 2016-06-19 NOTE — Telephone Encounter (Signed)
Patient stating she is having right shoulder and neck pain.  Requesting order to follow up and address shoulder pain during PT.  Requesting order to be placed in epic before the 28th.

## 2016-06-19 NOTE — Telephone Encounter (Signed)
Routing to dr plotnikov, please advise, I will call PT back

## 2016-06-19 NOTE — Telephone Encounter (Signed)
Left message advising keicha at [facility] dr plotnikov's note, call us back with something in order form

## 2016-06-19 NOTE — Telephone Encounter (Signed)
I think that her therapist needs to write an order - I'll sign Thx

## 2016-06-22 ENCOUNTER — Ambulatory Visit: Payer: BC Managed Care – PPO

## 2016-06-22 ENCOUNTER — Telehealth: Payer: Self-pay | Admitting: Internal Medicine

## 2016-06-22 DIAGNOSIS — M546 Pain in thoracic spine: Secondary | ICD-10-CM | POA: Diagnosis not present

## 2016-06-22 DIAGNOSIS — M62838 Other muscle spasm: Secondary | ICD-10-CM

## 2016-06-22 DIAGNOSIS — M6281 Muscle weakness (generalized): Secondary | ICD-10-CM

## 2016-06-22 NOTE — Therapy (Signed)
[facility] [address] [place], [place], [address] Phone: [phone]   Fax:  [phone]  Physical Therapy Treatment  Patient Details  Name: Regina [name] MRN: [id number] Date of Birth: [birth year] Referring Provider: Dr. Lew Dawes  Encounter Date: 06/22/2016      PT End of Session - 06/22/16 1620    Visit Number 21   Date for PT Re-Evaluation 06/24/16   PT Start Time 1530   PT Stop Time 1630   PT Time Calculation (min) 60 min   Activity Tolerance Patient tolerated treatment well   Behavior During Therapy [facility] for tasks assessed/performed      Past Medical History:  Diagnosis Date  . Allergic urticaria   . ARTHRALGIA   . Arthritis   . Atrial fibrillation ([place])   . B12 DEFICIENCY   . CFS (chronic fatigue syndrome)   . Endometriosis   . Epidural hemorrhage with loss of consciousness ([place]) 06/29/15  . Fibroid   . Fibromyalgia   . Fracture of cervical vertebra, C6 ([place]) 06/29/15  . Gastritis   . GERD   . Heart murmur   . History of endometriosis   . IBS (irritable bowel syndrome)   . Internal hemorrhoids   . MVP (mitral valve prolapse)   . Osteoarthritis   . Rosacea   . Thoracic compression fracture ([place]) 06/29/15   T2-T6    Past Surgical History:  Procedure Laterality Date  . ABDOMINAL HYSTERECTOMY     partial  . ABDOMINAL HYSTERECTOMY    . ANAL FISSURE REPAIR     Dr Rise [name] 2009  . APPENDECTOMY    . COLPOSCOPY    . HEMORRHOID SURGERY     Dr. Rise [name] 2009  . OVARIAN CYST REMOVAL    . RT BUNIONECTOMT    . TONSILLECTOMY AND ADENOIDECTOMY    . TUBAL LIGATION    . UTERINE FIBROID SURGERY      There were no vitals filed for this visit.      Subjective Assessment - 06/22/16 1536    Subjective Pt has been back to work for 4 days.  Neck has been more stiff.  Continued knee and LBP from fall last month.   Pertinent History fibromyalgia   Currently in Pain? Yes   Pain Score 4     Pain Location Neck   Pain Orientation Right;Left   Pain Descriptors / Indicators Dull   Pain Type Chronic pain   Pain Onset More than a month ago   Pain Frequency Constant   Aggravating Factors  turning to the Lt, sitting at the desk   Pain Relieving Factors rest and heat            [facility] PT Assessment - 06/22/16 0001      Observation/Other Assessments   Focus on Therapeutic Outcomes (FOTO)  44% limitation                     [facility] Adult PT Treatment/Exercise - 06/22/16 0001      Exercises   Exercises Lumbar     Neck Exercises: Machines for Strengthening   UBE (Upper Arm Bike) Level 2 x 8 minutes (4/4)  PT present to discuss progress     Lumbar Exercises: Seated   Other Seated Lumbar Exercises cervicothoracic rotation     Lumbar Exercises: Quadruped   [place] 20 reps   Other Quadruped Lumbar Exercises prayer stretch 3x20 seconds     Modalities   Modalities  Electrical Stimulation;Moist Heat     Moist Heat Therapy   Number Minutes Moist Heat 15 Minutes   Moist Heat Location Cervical     Electrical Stimulation   Electrical Stimulation Location bil neck/upper trap   Electrical Stimulation Action IFC   Electrical Stimulation Parameters 15 minutes   Electrical Stimulation Goals Pain     Manual Therapy   Manual Therapy Soft tissue mobilization   Soft tissue mobilization rhomboids and bil upper trap with pt prone          Trigger Point Dry Needling - 06/22/16 1539    Consent Given? Yes   Muscles Treated Upper Body Upper trapezius;Rhomboids   Upper Trapezius Response Twitch reponse elicited;Palpable increased muscle length   Rhomboids Response Twitch response elicited;Palpable increased muscle length              PT Education - 06/22/16 1613    Education provided Yes   Education Details quadruped flexibility and seated thoracothoracic rotation   Person(s) Educated Patient   Methods Explanation;Demonstration;Handout    Comprehension Verbalized understanding;Returned demonstration          PT Short Term Goals - 06/04/16 1711      PT SHORT TERM GOAL #1   Title independent with initial HEP   Status Achieved     PT SHORT TERM GOAL #2   Title headaches decreased in frequency by 25%   Status Achieved     PT SHORT TERM GOAL #3   Title ability to turn her head with pain decreased >/= 25% due to increase tissue mobility   Status Achieved           PT Long Term Goals - 06/22/16 1649      PT LONG TERM GOAL #5   Title FOTO score </= 45% limitation   Baseline 44% limitation- 06/22/16   Status Achieved               Plan - 06/22/16 1614    Clinical Impression Statement Pt reports that her neck and thoracic spine are better overall.  Pt has returned to work and this has increased her stiffness in the neck and thoracic spine.  Pt also reports that her vertigo has increased.  Pt with trigger points in bil. upper traps and Rt rhomboids and demonstated improved tissue mobility after dry needling and manual therapy today.  PT issued thoacic mobility exercises due to stiffness with return to work.  Reassessment needed today.  Pt will talk to MD regarding Rt knee pain and Rt shoulder and low back pain.  Pt will continue to benefit from skilled PT for postural strength, flexibility and manual as needed.     Rehab Potential Good   Clinical Impairments Affecting Rehab Potential Fell on 06/29/2015 resulting with fractured skull, fractured T7, fractured shoulder, had 12 weeks of [facility] therapy   PT Frequency 3x / week   PT Duration 12 weeks   PT Treatment/Interventions Cryotherapy;Electrical Stimulation;Ultrasound;Traction;Moist Heat;Therapeutic activities;Therapeutic exercise;Neuromuscular re-education;Patient/family education;Passive range of motion;Manual techniques;Dry needling;Energy conservation;Taping   PT Next Visit Plan Re-eval next session.     Consulted and Agree with Plan of Care Patient       Patient will benefit from skilled therapeutic intervention in order to improve the following deficits and impairments:  Decreased range of motion, Increased fascial restricitons, Decreased endurance, Increased muscle spasms, Pain, Hypomobility, Impaired flexibility, Decreased mobility, Decreased strength  Visit Diagnosis: Other muscle spasm  Muscle weakness (generalized)  Pain in thoracic spine  Problem List Patient Active Problem List   Diagnosis Date Noted  . Knee pain, right 05/21/2016  . Hamstring strain, sequela 05/21/2016  . Wart viral 05/12/2016  . Contusion of knee 05/05/2016  . Fall involving sidewalk curb 05/05/2016  . Aphasia 03/13/2016  . Shoulder pain, bilateral 12/11/2015  . Fibromyalgia 12/11/2015  . Post concussion syndrome 11/01/2015  . Clavicle fracture 10/30/2015  . Hair loss 10/11/2015  . Pain in joint, shoulder region 09/11/2015  . Right wrist pain 08/07/2015  . Splinter in skin 07/30/2015  . Fall from ladder 07/30/2015  . Skull fracture with concussion ([place]) 07/30/2015  . Fracture of cervical vertebra, C6 ([place]) 07/30/2015  . Thoracic vertebral fracture ([place]) 07/30/2015  . Abdominal pain, epigastric 05/07/2015  . Scaly patch rash 02/26/2015  . Mitral valve regurgitation 09/27/2014  . PAT (paroxysmal atrial tachycardia) ([place]) 09/06/2014  . Tachycardia 07/31/2014  . Sprain of ankle 06/29/2014  . Earache, left 05/15/2013  . Dizziness 05/15/2013  . Asthmatic bronchitis 05/08/2013  . Benign paroxysmal positional vertigo 05/08/2013  . Chest wall pain 01/26/2013  . LUQ abdominal pain 01/20/2013  . Insomnia 01/10/2013  . Depression (emotion) 01/10/2013  . URI, acute 08/25/2012  . Snoring 08/25/2012  . IBS (irritable bowel syndrome) 02/08/2012  . Multiple pulmonary nodules 02/08/2012  . Chest heaviness 11/16/2010  . GERD 05/24/2010  . SINUSITIS, MAXILLARY, CHRONIC 05/20/2010  . Myalgia and myositis 11/20/2009  . Fatigue 11/20/2009  .  ARTHRALGIA 06/26/2009  . Cervicogenic headache 06/26/2009  . TOBACCO USE, QUIT 06/26/2009  . LOW BACK PAIN 02/20/2009  . Rosacea 12/27/2008  . PRURITUS 12/27/2008  . Allergic urticaria 12/27/2008  . B12 deficiency 01/02/2008     Sigurd Sos, PT 06/22/16 4:50 PM  [facility] [address], [place], [place], [address] Phone: [phone]   Fax:  [phone]  Name: Regina [name] MRN: [id number] Date of Birth: 03-24-1966

## 2016-06-22 NOTE — Patient Instructions (Addendum)
Angry Cat Stretch  Tuck chin and tighten stomach, arching back. Repeat __5-10__ times per set.  Do _1-2___ sessions per day. Child Pose   Sitting on knees, fold body over legs and relax head and arms on floor. Hold for _20___ breaths.  Do 3 reps.  1-2 times a day.  Copyright  VHI. All rights reserved.  Side Waist Stretch from Child's Pose  From child's pose, walk hands to left. Reach right hand out on diagonal. Reach hips back toward heels making a C with torso. Breathe into right side waist. Hold for _20___ breaths. Repeat _3___ times each side.  1-2 times a day.  Copyright  VHI. All rights reserved.    Cervico-Thoracic: Extension / Rotation (Sitting)    Reach across body with left arm and grasp back of chair. Gently look over right side shoulder. Hold _20___ seconds. Relax. Repeat __3__ times per set. Do __1_ sets per session. Do __3__ sessions per day.  http://orth.exer.us/981   Copyright  VHI. All rights reserved.  [facility] [address], [address] [place], [place] [address] Phone # [phone] Fax [phone] 

## 2016-06-22 NOTE — Telephone Encounter (Signed)
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 She said that she could set up a time to speak with him or she could send a letter with questions for him. Please advise. Thanks [employer]

## 2016-06-22 NOTE — Telephone Encounter (Signed)
Routing to dr plotnikov, please advise, thanks 

## 2016-06-23 NOTE — Telephone Encounter (Signed)
I need a release/authorization from the pt. Letter is ok Thx

## 2016-06-23 NOTE — Telephone Encounter (Signed)
Advised robin of dr plotnikovs instructions---she will get release/auth letter from patient and also put together a form with questions she needs clarified on patients functional capacity and fax over to [place]

## 2016-06-24 ENCOUNTER — Ambulatory Visit: Payer: BC Managed Care – PPO

## 2016-06-24 DIAGNOSIS — M546 Pain in thoracic spine: Secondary | ICD-10-CM | POA: Diagnosis not present

## 2016-06-24 DIAGNOSIS — M62838 Other muscle spasm: Secondary | ICD-10-CM

## 2016-06-24 DIAGNOSIS — M6281 Muscle weakness (generalized): Secondary | ICD-10-CM

## 2016-06-24 DIAGNOSIS — M542 Cervicalgia: Secondary | ICD-10-CM

## 2016-06-24 NOTE — Therapy (Signed)
[facility] [address] [place], [place], [address] Phone: [phone]   Fax:  [phone]  Physical Therapy Treatment  Patient Details  Name: Regina [name] MRN: [id number] Date of Birth: 03-13-1966 Referring Provider: Dr. Lew Dawes  Encounter Date: 06/24/2016      PT End of Session - 06/24/16 1620    Visit Number 22   Date for PT Re-Evaluation 08/05/16   PT Start Time 1527   PT Stop Time 1630   PT Time Calculation (min) 63 min   Activity Tolerance Patient tolerated treatment well   Behavior During Therapy [facility] for tasks assessed/performed      Past Medical History:  Diagnosis Date  . Allergic urticaria   . ARTHRALGIA   . Arthritis   . Atrial fibrillation ([place])   . B12 DEFICIENCY   . CFS (chronic fatigue syndrome)   . Endometriosis   . Epidural hemorrhage with loss of consciousness ([place]) 06/29/15  . Fibroid   . Fibromyalgia   . Fracture of cervical vertebra, C6 ([place]) 06/29/15  . Gastritis   . GERD   . Heart murmur   . History of endometriosis   . IBS (irritable bowel syndrome)   . Internal hemorrhoids   . MVP (mitral valve prolapse)   . Osteoarthritis   . Rosacea   . Thoracic compression fracture ([place]) 06/29/15   T2-T6    Past Surgical History:  Procedure Laterality Date  . ABDOMINAL HYSTERECTOMY     partial  . ABDOMINAL HYSTERECTOMY    . ANAL FISSURE REPAIR     Dr Rise [name] 2009  . APPENDECTOMY    . COLPOSCOPY    . HEMORRHOID SURGERY     Dr. Rise [name] 2009  . OVARIAN CYST REMOVAL    . RT BUNIONECTOMT    . TONSILLECTOMY AND ADENOIDECTOMY    . TUBAL LIGATION    . UTERINE FIBROID SURGERY      There were no vitals filed for this visit.      Subjective Assessment - 06/24/16 1539    Subjective Sore after dry needling and it really helped on the Rt side of the back.  75% overall improvment since the start of care.   Pertinent History fibromyalgia   Currently in Pain? Yes   Pain  Score 2    Pain Location Neck   Pain Orientation Right;Left   Pain Radiating Towards Pt with 4-5/10 Rt wrist pain- pt reports pain since the accident   Effect of Pain on Daily Activities pain with wearing a back pack, turning head with driving and needs to take breaks during housework.   Pain Score 2   Pain Location Back   Pain Orientation Right;Left            [facility] PT Assessment - 06/24/16 0001      Assessment   Medical Diagnosis F07.81 Post concusion syndrome; S22.068D Other closed fracture of seventh thoracic vertebra lwith routine healing, subbsequent encounter; R51 Cervicogenic headache   Onset Date/Surgical Date 06/29/15     Observation/Other Assessments   Focus on Therapeutic Outcomes (FOTO)  44% limitation     AROM   Cervical Flexion 50   Cervical - Right Side Bend 45   Cervical - Left Side Bend 44   Cervical - Right Rotation 50   Cervical - Left Rotation 50                     [facility] Adult PT Treatment/Exercise - 06/24/16  0001      Neck Exercises: Machines for Strengthening   UBE (Upper Arm Bike) Level 2 x 8 minutes (4/4)  PT present to discuss progress     Neck Exercises: Standing   Other Standing Exercises snow angels on wall x10     Neck Exercises: Seated   Shoulder Flexion 20 reps;Both  Sitting on ball   Shoulder Flexion Weights (lbs) 1   Shoulder ABduction 20 reps;Both  Sitting on ball   Shoulder Abduction Weights (lbs) 1   Shoulder Abduction Limitations scaption 2x10     Lumbar Exercises: Seated   Other Seated Lumbar Exercises cervicothoracic rotation     Lumbar Exercises: Quadruped   Other Quadruped Lumbar Exercises prayer stretch 3x20 seconds  at counter     Modalities   Modalities Electrical Stimulation;Moist Heat     Moist Heat Therapy   Number Minutes Moist Heat 15 Minutes   Moist Heat Location Cervical     Electrical Stimulation   Electrical Stimulation Location bil neck/upper trap   Electrical Stimulation Action IFC    Electrical Stimulation Parameters 15 minutes   Electrical Stimulation Goals Pain     Manual Therapy   Manual Therapy Soft tissue mobilization   Manual therapy comments Pt supine   Soft tissue mobilization Suboccipitals, paraspinals, and cervical muscles; temporalis   Myofascial Release cervical muscles, suboccipitals, SCM; anterior cervical musculature                  PT Short Term Goals - 06/04/16 1711      PT SHORT TERM GOAL #1   Title independent with initial HEP   Status Achieved     PT SHORT TERM GOAL #2   Title headaches decreased in frequency by 25%   Status Achieved     PT SHORT TERM GOAL #3   Title ability to turn her head with pain decreased >/= 25% due to increase tissue mobility   Status Achieved           PT Long Term Goals - 06/24/16 1531      PT LONG TERM GOAL #1   Title independent with HEP and understand how to progress herself   Time 6   Period Weeks   Status On-going     PT LONG TERM GOAL #2   Title ability to look upward into a cabinet with >/= 50% greater ease due to increase mobility and decreased pain.   Status Achieved     PT LONG TERM GOAL #3   Title turn neck with driving without increased pain   Time 6   Period Weeks   Status New     PT LONG TERM GOAL #4   Title incresaed strength and decrease pain  to vacuum and mop floors in her house without needing to take a rest   Time 6   Period Weeks   Status New     PT LONG TERM GOAL #5   Title FOTO score </= 45% limitation   Baseline 44% limitation- 06/22/16   Status Achieved     Additional Long Term Goals   Additional Long Term Goals Yes     PT LONG TERM GOAL #6   Title wear a back pack for work without incresaed pain   Time 6   Period Weeks   Status New               Plan - 06/24/16 1543    Clinical Impression Statement Pt reports 75% overall pain  reduction since the start of care.  Pt with trigger points that have been improving in her neck and thoracic  spince.  Pt responds well to dry needling.  Pt had a flare-up over the past week due to returning to work.  Pt with continued pain with turning head with driving, with cleaning her floors and when carrying a backpack for work.  Pt will continue to benefit from skilled PT for strength, flexibility and manual therapy/modalities to allow for pt to reuturn to tasks with less pain.     Rehab Potential Good   Clinical Impairments Affecting Rehab Potential Fell on 06/29/2015 resulting with fractured skull, fractured T7, fractured shoulder, had 12 weeks of [facility]   PT Frequency 2x / week   PT Duration 6 weeks   PT Treatment/Interventions Cryotherapy;Electrical Stimulation;Ultrasound;Traction;Moist Heat;Therapeutic activities;Therapeutic exercise;Neuromuscular re-education;Patient/family education;Passive range of motion;Manual techniques;Dry needling;Energy conservation;Taping   PT Next Visit Plan Continue strength for thoracic and UEs, cervcial A/ROM, manual and modalities.   Consulted and Agree with Plan of Care Patient      Patient will benefit from skilled therapeutic intervention in order to improve the following deficits and impairments:  Decreased range of motion, Increased fascial restricitons, Decreased endurance, Increased muscle spasms, Pain, Hypomobility, Impaired flexibility, Decreased mobility, Decreased strength  Visit Diagnosis: Other muscle spasm - Plan: PT plan of care cert/re-cert  Muscle weakness (generalized) - Plan: PT plan of care cert/re-cert  Pain in thoracic spine - Plan: PT plan of care cert/re-cert  Cervicalgia - Plan: PT plan of care cert/re-cert     Problem List Patient Active Problem List   Diagnosis Date Noted  . Knee pain, right 05/21/2016  . Hamstring strain, sequela 05/21/2016  . Wart viral 05/12/2016  . Contusion of knee 05/05/2016  . Fall involving sidewalk curb 05/05/2016  . Aphasia 03/13/2016  . Shoulder pain, bilateral 12/11/2015  .  Fibromyalgia 12/11/2015  . Post concussion syndrome 11/01/2015  . Clavicle fracture 10/30/2015  . Hair loss 10/11/2015  . Pain in joint, shoulder region 09/11/2015  . Right wrist pain 08/07/2015  . Splinter in skin 07/30/2015  . Fall from ladder 07/30/2015  . Skull fracture with concussion ([place]) 07/30/2015  . Fracture of cervical vertebra, C6 ([place]) 07/30/2015  . Thoracic vertebral fracture ([place]) 07/30/2015  . Abdominal pain, epigastric 05/07/2015  . Scaly patch rash 02/26/2015  . Mitral valve regurgitation 09/27/2014  . PAT (paroxysmal atrial tachycardia) ([place]) 09/06/2014  . Tachycardia 07/31/2014  . Sprain of ankle 06/29/2014  . Earache, left 05/15/2013  . Dizziness 05/15/2013  . Asthmatic bronchitis 05/08/2013  . Benign paroxysmal positional vertigo 05/08/2013  . Chest wall pain 01/26/2013  . LUQ abdominal pain 01/20/2013  . Insomnia 01/10/2013  . Depression (emotion) 01/10/2013  . URI, acute 08/25/2012  . Snoring 08/25/2012  . IBS (irritable bowel syndrome) 02/08/2012  . Multiple pulmonary nodules 02/08/2012  . Chest heaviness 11/16/2010  . GERD 05/24/2010  . SINUSITIS, MAXILLARY, CHRONIC 05/20/2010  . Myalgia and myositis 11/20/2009  . Fatigue 11/20/2009  . ARTHRALGIA 06/26/2009  . Cervicogenic headache 06/26/2009  . TOBACCO USE, QUIT 06/26/2009  . LOW BACK PAIN 02/20/2009  . Rosacea 12/27/2008  . PRURITUS 12/27/2008  . Allergic urticaria 12/27/2008  . B12 deficiency 01/02/2008    Sigurd Sos, PT 06/24/16 4:22 PM  [facility] [address] [place], [place], [address] Phone: [phone]   Fax:  [phone]  Name: Regina [name] MRN: [id number] Date of Birth: Oct 22, 1965

## 2016-06-26 ENCOUNTER — Telehealth: Payer: Self-pay

## 2016-06-26 NOTE — Telephone Encounter (Signed)
Spoke to [employer]) and explained details of the PREP.  She will come in on 06/29/16 at 8:30am to register.

## 2016-06-29 ENCOUNTER — Ambulatory Visit: Payer: BC Managed Care – PPO | Attending: Internal Medicine

## 2016-06-29 DIAGNOSIS — M546 Pain in thoracic spine: Secondary | ICD-10-CM

## 2016-06-29 DIAGNOSIS — M542 Cervicalgia: Secondary | ICD-10-CM

## 2016-06-29 DIAGNOSIS — M6281 Muscle weakness (generalized): Secondary | ICD-10-CM | POA: Diagnosis present

## 2016-06-29 DIAGNOSIS — M62838 Other muscle spasm: Secondary | ICD-10-CM

## 2016-06-29 NOTE — Progress Notes (Signed)
[facility] Report   Patient Details  Name: Regina [name] MRN: [id number] Date of Birth: 23-Feb-1966 Age: 51 y.o. PCP: Walker Kehr, MD  Vitals:   06/29/16 1108  BP: 118/82  Pulse: 62  Resp: 18  SpO2: 98%  Weight: 171 lb 9.6 oz (77.8 kg)  Height: 5\' 4"  (1.626 m)         [facility] Eval - 06/29/16 1100      Referral    Referring Provider Earlie Counts, PT   Reason for referral Inactivity;Obesitity/Overweight;Orthopedic   Program Start Date 06/29/16     Measurement   Waist Circumference 40.8 inches   Hip Circumference 44.5 inches   Body fat 49.1 percent     Information for Trainer   Goals "Need to be back exercising" "Need to build my strength back" "To lose weight"   Current Exercise P.T. 2x's/week   Orthopedic Concerns R shoulder/wrist, neck, R hamstring   Pertinent Medical History TBI from fall   Current Barriers "Myself" and she says she has never been a "gym" person.  Had been a runner in past.    Medications that affect exercise Medication causing dizziness/drowsiness  also have positional vertigo     Mobility and Daily Activities   I find it easy to walk up or down two or more flights of stairs. 3   I have no trouble taking out the trash. 4   I do housework such as vacuuming and dusting on my own without difficulty. 2   I can easily lift a gallon of milk (8lbs). 4   I can easily walk a mile. 4   I have no trouble reaching into high cupboards or reaching down to pick up something from the floor. 2   I do not have trouble doing out-door work such as Armed forces logistics/support/administrative officer, raking leaves, or gardening. 2     Mobility and Daily Activities   I feel younger than my age. 3   I feel independent. 3   I feel energetic. 2   I live an active life.  2   I feel strong. 1   I feel healthy. 2   I feel active as other people my age. 2     How fit and strong are you.   Fit and Strong Total Score 36     Past Medical History:  Diagnosis Date  . Allergic  urticaria   . ARTHRALGIA   . Arthritis   . Atrial fibrillation ([place])   . B12 DEFICIENCY   . CFS (chronic fatigue syndrome)   . Endometriosis   . Epidural hemorrhage with loss of consciousness ([place]) 06/29/15  . Fibroid   . Fibromyalgia   . Fracture of cervical vertebra, C6 ([place]) 06/29/15  . Gastritis   . GERD   . Heart murmur   . History of endometriosis   . IBS (irritable bowel syndrome)   . Internal hemorrhoids   . MVP (mitral valve prolapse)   . Osteoarthritis   . Rosacea   . Thoracic compression fracture ([place]) 06/29/15   T2-T6   Past Surgical History:  Procedure Laterality Date  . ABDOMINAL HYSTERECTOMY     partial  . ABDOMINAL HYSTERECTOMY    . ANAL FISSURE REPAIR     Dr Rise [name] 2009  . APPENDECTOMY    . COLPOSCOPY    . HEMORRHOID SURGERY     Dr. Rise [name] 2009  . OVARIAN CYST REMOVAL    . RT BUNIONECTOMT    .  TONSILLECTOMY AND ADENOIDECTOMY    . TUBAL LIGATION    . UTERINE FIBROID SURGERY     History  Smoking Status  . Former Smoker  . Quit date: 10/31/1988  Smokeless Tobacco  . Never Used    Yvonna Alanis "Lorre Nick" has joined the [employer] today and plans to attend the Monday 4:30-5:30 evening classes.  She will be assigned to a personal trainer this week.  She seems to be excited about starting and states she looks forward to the structure and accountability.   Vanita Ingles 06/29/2016, 11:16 AM

## 2016-06-29 NOTE — Therapy (Signed)
[facility] [address], [place], [place], [address] Phone: [phone]   Fax:  [phone]  Physical Therapy Treatment  Patient Details  Name: Regina [name] MRN: [id number] Date of Birth: 1965/05/22 Referring Provider: Dr. Lew Dawes  Encounter Date: 06/29/2016      PT End of Session - 06/29/16 1538    Visit Number 23   Date for PT Re-Evaluation 08/05/16   PT Start Time 1533   PT Stop Time 1636   PT Time Calculation (min) 63 min   Activity Tolerance Patient tolerated treatment well   Behavior During Therapy [facility] for tasks assessed/performed      Past Medical History:  Diagnosis Date  . Allergic urticaria   . ARTHRALGIA   . Arthritis   . Atrial fibrillation ([place])   . B12 DEFICIENCY   . CFS (chronic fatigue syndrome)   . Endometriosis   . Epidural hemorrhage with loss of consciousness ([place]) 06/29/15  . Fibroid   . Fibromyalgia   . Fracture of cervical vertebra, C6 ([place]) 06/29/15  . Gastritis   . GERD   . Heart murmur   . History of endometriosis   . IBS (irritable bowel syndrome)   . Internal hemorrhoids   . MVP (mitral valve prolapse)   . Osteoarthritis   . Rosacea   . Thoracic compression fracture ([place]) 06/29/15   T2-T6    Past Surgical History:  Procedure Laterality Date  . ABDOMINAL HYSTERECTOMY     partial  . ABDOMINAL HYSTERECTOMY    . ANAL FISSURE REPAIR     Dr Rise [name] 2009  . APPENDECTOMY    . COLPOSCOPY    . HEMORRHOID SURGERY     Dr. Rise [name] 2009  . OVARIAN CYST REMOVAL    . RT BUNIONECTOMT    . TONSILLECTOMY AND ADENOIDECTOMY    . TUBAL LIGATION    . UTERINE FIBROID SURGERY      There were no vitals filed for this visit.      Subjective Assessment - 06/29/16 1535    Subjective Pt. reporting stiffness in mornings which has subided and improved    Patient Stated Goals reduce pain, turn neck instead of body   Currently in Pain? Yes   Pain Score 2    Pain Location  Neck   Pain Orientation Right;Left   Pain Descriptors / Indicators Dull   Pain Type Chronic pain   Pain Onset More than a month ago   Pain Score 3   Pain Location Back   Pain Orientation Right;Left   Pain Descriptors / Indicators [facility] Adult PT Treatment/Exercise - 06/29/16 1541      Neck Exercises: Machines for Strengthening   UBE (Upper Arm Bike) Level 2 x 8 minutes (4/4)     Neck Exercises: Theraband   Rows --   Rows Limitations --     Lumbar Exercises: Standing   Row AROM;15 reps;Strengthening;Theraband   Theraband Level (Row) Level 3 (Green)   Row Limitations 3" hold    Shoulder Extension AROM;Both;15 reps;Strengthening;Theraband   Theraband Level (Shoulder Extension) Level 3 (Green)   Shoulder Extension Limitations 3" hold      Lumbar Exercises: Seated   Other Seated Lumbar Exercises Seated on green p-ball (65cm) alternating LE/UE raise x 15 reps each side     Lumbar Exercises: Supine  Other Supine Lumbar Exercises Supine ER with red TB laying on 6" bolster x 15 reps   Other Supine Lumbar Exercises Supine horizontal shoulder abduction with red TB x 15 reps     Lumbar Exercises: Quadruped   Straight Leg Raise 10 reps;3 seconds   Straight Leg Raises Limitations Quadruped on red p-ball (55cm)    Other Quadruped Lumbar Exercises prayer stretch 3x20 seconds  on table      Moist Heat Therapy   Number Minutes Moist Heat 15 Minutes   Moist Heat Location Cervical  and mid back     Electrical Stimulation   Electrical Stimulation Location bil neck/upper trap   Electrical Stimulation Action IFC   Electrical Stimulation Parameters 15 minutes    Electrical Stimulation Goals Pain     Manual Therapy   Manual Therapy Passive ROM   Manual therapy comments Pt supine   Passive ROM Gentle B UT, levator scap. stretch with therapist      Neck Exercises: Stretches   Upper Trapezius Stretch 2 reps;30 seconds   Levator Stretch 2  reps;30 seconds   Chest Stretch --  Laying on 6" bolster    Other Neck Stretches supine anterior chest stretch laying on 6" bolster in multi-angle position 3 x 30 sec                   PT Short Term Goals - 06/04/16 1711      PT SHORT TERM GOAL #1   Title independent with initial HEP   Status Achieved     PT SHORT TERM GOAL #2   Title headaches decreased in frequency by 25%   Status Achieved     PT SHORT TERM GOAL #3   Title ability to turn her head with pain decreased >/= 25% due to increase tissue mobility   Status Achieved           PT Long Term Goals - 06/24/16 1531      PT LONG TERM GOAL #1   Title independent with HEP and understand how to progress herself   Time 6   Period Weeks   Status On-going     PT LONG TERM GOAL #2   Title ability to look upward into a cabinet with >/= 50% greater ease due to increase mobility and decreased pain.   Status Achieved     PT LONG TERM GOAL #3   Title turn neck with driving without increased pain   Time 6   Period Weeks   Status New     PT LONG TERM GOAL #4   Title incresaed strength and decrease pain  to vacuum and mop floors in her house without needing to take a rest   Time 6   Period Weeks   Status New     PT LONG TERM GOAL #5   Title FOTO score </= 45% limitation   Baseline 44% limitation- 06/22/16   Status Achieved     Additional Long Term Goals   Additional Long Term Goals Yes     PT LONG TERM GOAL #6   Title wear a back pack for work without incresaed pain   Time 6   Period Weeks   Status New               Plan - 06/29/16 1717    Clinical Impression Statement Pt. reporting, "Average pain levels" initially today. Today's treatment focusing on gentle scapular strengthening activity laying on 6" bolster and lumbopelvic strengthening activities.  Pt. tolerating all activities well with slight pain increase in lower and mid back.  E-stim/moist heat to cervical/upper shoulder musculature to  promote muscular relaxation and reduce pain.   PT Treatment/Interventions Cryotherapy;Electrical Stimulation;Ultrasound;Traction;Moist Heat;Therapeutic activities;Therapeutic exercise;Neuromuscular re-education;Patient/family education;Passive range of motion;Manual techniques;Dry needling;Energy conservation;Taping   PT Next Visit Plan Continue strength for thoracic and UEs, cervcial A/ROM, manual and modalities.      Patient will benefit from skilled therapeutic intervention in order to improve the following deficits and impairments:  Decreased range of motion, Increased fascial restricitons, Decreased endurance, Increased muscle spasms, Pain, Hypomobility, Impaired flexibility, Decreased mobility, Decreased strength  Visit Diagnosis: Other muscle spasm  Muscle weakness (generalized)  Pain in thoracic spine  Cervicalgia     Problem List Patient Active Problem List   Diagnosis Date Noted  . Knee pain, right 05/21/2016  . Hamstring strain, sequela 05/21/2016  . Wart viral 05/12/2016  . Contusion of knee 05/05/2016  . Fall involving sidewalk curb 05/05/2016  . Aphasia 03/13/2016  . Shoulder pain, bilateral 12/11/2015  . Fibromyalgia 12/11/2015  . Post concussion syndrome 11/01/2015  . Clavicle fracture 10/30/2015  . Hair loss 10/11/2015  . Pain in joint, shoulder region 09/11/2015  . Right wrist pain 08/07/2015  . Splinter in skin 07/30/2015  . Fall from ladder 07/30/2015  . Skull fracture with concussion ([place]) 07/30/2015  . Fracture of cervical vertebra, C6 ([place]) 07/30/2015  . Thoracic vertebral fracture ([place]) 07/30/2015  . Abdominal pain, epigastric 05/07/2015  . Scaly patch rash 02/26/2015  . Mitral valve regurgitation 09/27/2014  . PAT (paroxysmal atrial tachycardia) ([place]) 09/06/2014  . Tachycardia 07/31/2014  . Sprain of ankle 06/29/2014  . Earache, left 05/15/2013  . Dizziness 05/15/2013  . Asthmatic bronchitis 05/08/2013  . Benign paroxysmal positional vertigo  05/08/2013  . Chest wall pain 01/26/2013  . LUQ abdominal pain 01/20/2013  . Insomnia 01/10/2013  . Depression (emotion) 01/10/2013  . URI, acute 08/25/2012  . Snoring 08/25/2012  . IBS (irritable bowel syndrome) 02/08/2012  . Multiple pulmonary nodules 02/08/2012  . Chest heaviness 11/16/2010  . GERD 05/24/2010  . SINUSITIS, MAXILLARY, CHRONIC 05/20/2010  . Myalgia and myositis 11/20/2009  . Fatigue 11/20/2009  . ARTHRALGIA 06/26/2009  . Cervicogenic headache 06/26/2009  . TOBACCO USE, QUIT 06/26/2009  . LOW BACK PAIN 02/20/2009  . Rosacea 12/27/2008  . PRURITUS 12/27/2008  . Allergic urticaria 12/27/2008  . B12 deficiency 01/02/2008    Bess Harvest, PTA 06/29/16 5:25 PM  [facility] [address], [place], [place], [address] Phone: [phone]   Fax:  [phone]  Name: BRIAUNA GILMARTIN MRN: [id number] Date of Birth: 1966-03-20

## 2016-07-01 ENCOUNTER — Ambulatory Visit
Admission: RE | Admit: 2016-07-01 | Discharge: 2016-07-01 | Disposition: A | Discharge status: Home / Self Care | Payer: BC Managed Care – PPO | Source: Ambulatory Visit | Attending: Obstetrics and Gynecology | Admitting: Obstetrics and Gynecology

## 2016-07-01 DIAGNOSIS — Z1231 Encounter for screening mammogram for malignant neoplasm of breast: Secondary | ICD-10-CM

## 2016-07-02 ENCOUNTER — Ambulatory Visit: Payer: BC Managed Care – PPO | Admitting: Physical Therapy

## 2016-07-02 ENCOUNTER — Encounter: Payer: Self-pay | Admitting: Physical Therapy

## 2016-07-02 DIAGNOSIS — M542 Cervicalgia: Secondary | ICD-10-CM

## 2016-07-02 DIAGNOSIS — M546 Pain in thoracic spine: Secondary | ICD-10-CM

## 2016-07-02 DIAGNOSIS — M62838 Other muscle spasm: Secondary | ICD-10-CM

## 2016-07-02 DIAGNOSIS — M6281 Muscle weakness (generalized): Secondary | ICD-10-CM

## 2016-07-02 NOTE — Therapy (Signed)
[facility] [address] [place], [place], [address] Phone: [phone]   Fax:  [phone]  Physical Therapy Treatment  Patient Details  Name: Regina [name] MRN: [id number] Date of Birth: [birth year] Referring Provider: Dr. Lew Dawes  Encounter Date: 07/02/2016      PT End of Session - 07/02/16 0811    Visit Number 24   Date for PT Re-Evaluation 08/05/16   PT Start Time 0806   PT Stop Time 0856   PT Time Calculation (min) 50 min   Activity Tolerance Patient tolerated treatment well   Behavior During Therapy [facility] for tasks assessed/performed      Past Medical History:  Diagnosis Date  . Allergic urticaria   . ARTHRALGIA   . Arthritis   . Atrial fibrillation ([place])   . B12 DEFICIENCY   . CFS (chronic fatigue syndrome)   . Endometriosis   . Epidural hemorrhage with loss of consciousness ([place]) 06/29/15  . Fibroid   . Fibromyalgia   . Fracture of cervical vertebra, C6 ([place]) 06/29/15  . Gastritis   . GERD   . Heart murmur   . History of endometriosis   . IBS (irritable bowel syndrome)   . Internal hemorrhoids   . MVP (mitral valve prolapse)   . Osteoarthritis   . Rosacea   . Thoracic compression fracture ([place]) 06/29/15   T2-T6    Past Surgical History:  Procedure Laterality Date  . ABDOMINAL HYSTERECTOMY     partial  . ABDOMINAL HYSTERECTOMY    . ANAL FISSURE REPAIR     Dr Rise [name] 2009  . APPENDECTOMY    . COLPOSCOPY    . HEMORRHOID SURGERY     Dr. Rise [name] 2009  . OVARIAN CYST REMOVAL    . RT BUNIONECTOMT    . TONSILLECTOMY AND ADENOIDECTOMY    . TUBAL LIGATION    . UTERINE FIBROID SURGERY      There were no vitals filed for this visit.      Subjective Assessment - 07/02/16 0807    Subjective Pt reports feeling some soreness after last session through shoulders but it's ok. Having more pain in low back from doing yard work.    Pertinent History fibromyalgia   Patient Stated Goals  reduce pain, turn neck instead of body   Currently in Pain? Yes   Pain Score 2    Pain Location Neck   Pain Orientation Right;Left   Pain Descriptors / Indicators Dull   Pain Type Chronic pain   Pain Score 3   Pain Location Back   Pain Orientation Right;Left   Pain Descriptors / Indicators Sharp   Pain Type Chronic pain   Pain Onset More than a month ago   Pain Frequency Intermittent                         [facility] Adult PT Treatment/Exercise - 07/02/16 0001      Neck Exercises: Machines for Strengthening   UBE (Upper Arm Bike) Level 4 x 8 minutes (4/4)     Neck Exercises: Standing   Other Standing Exercises snow angels on wall x10     Lumbar Exercises: Stretches   Active Hamstring Stretch 2 reps;10 seconds   Piriformis Stretch 2 reps;10 seconds     Lumbar Exercises: Standing   Row AROM;Strengthening;20 reps  #20   Theraband Level (Row) --   Row Limitations 3" hold   Shoulder Extension AROM;Both;Strengthening;20 reps  Theraband Level (Shoulder Extension) --  #20   Shoulder Extension Limitations 3" hold     Lumbar Exercises: Seated   Other Seated Lumbar Exercises Seated UE raises  flexion, abduction, scaption #2     Lumbar Exercises: Quadruped   Straight Leg Raise 10 reps;3 seconds   Straight Leg Raises Limitations Quadruped on red p-ball (55cm)    Other Quadruped Lumbar Exercises prayer stretch 3x20 seconds  on table      Modalities   Modalities Electrical Stimulation;Moist Heat     Moist Heat Therapy   Number Minutes Moist Heat 15 Minutes   Moist Heat Location Cervical  and mid back     Electrical Stimulation   Electrical Stimulation Location bil neck/upper trap   Electrical Stimulation Action IFC   Electrical Stimulation Parameters 15 minutes   Electrical Stimulation Goals Pain                  PT Short Term Goals - 06/04/16 1711      PT SHORT TERM GOAL #1   Title independent with initial HEP   Status Achieved     PT  SHORT TERM GOAL #2   Title headaches decreased in frequency by 25%   Status Achieved     PT SHORT TERM GOAL #3   Title ability to turn her head with pain decreased >/= 25% due to increase tissue mobility   Status Achieved           PT Long Term Goals - 06/24/16 1531      PT LONG TERM GOAL #1   Title independent with HEP and understand how to progress herself   Time 6   Period Weeks   Status On-going     PT LONG TERM GOAL #2   Title ability to look upward into a cabinet with >/= 50% greater ease due to increase mobility and decreased pain.   Status Achieved     PT LONG TERM GOAL #3   Title turn neck with driving without increased pain   Time 6   Period Weeks   Status New     PT LONG TERM GOAL #4   Title incresaed strength and decrease pain  to vacuum and mop floors in her house without needing to take a rest   Time 6   Period Weeks   Status New     PT LONG TERM GOAL #5   Title FOTO score </= 45% limitation   Baseline 44% limitation- 06/22/16   Status Achieved     Additional Long Term Goals   Additional Long Term Goals Yes     PT LONG TERM GOAL #6   Title wear a back pack for work without incresaed pain   Time 6   Period Weeks   Status New               Plan - 07/02/16 0843    Clinical Impression Statement Pt reports some increase muscle sorness across neck and shoulders but most pain in low back most likly from yard work. Pt able to tolerate all exercises well today with no increased pain. Pt having a bad cough and not wanting to do too many exercises. Pt will continue to benefit from skilled therapy for postural and core strengthening.    Rehab Potential Good   Clinical Impairments Affecting Rehab Potential Fell on 06/29/2015 resulting with fractured skull, fractured T7, fractured shoulder, had 12 weeks of [facility] therapy   PT Frequency 2x /  week   PT Duration 6 weeks   PT Treatment/Interventions Cryotherapy;Electrical  Stimulation;Ultrasound;Traction;Moist Heat;Therapeutic activities;Therapeutic exercise;Neuromuscular re-education;Patient/family education;Passive range of motion;Manual techniques;Dry needling;Energy conservation;Taping   PT Next Visit Plan Continue strength for thoracic and UEs, cervcial A/ROM, manual and modalities.   Consulted and Agree with Plan of Care Patient      Patient will benefit from skilled therapeutic intervention in order to improve the following deficits and impairments:  Decreased range of motion, Increased fascial restricitons, Decreased endurance, Increased muscle spasms, Pain, Hypomobility, Impaired flexibility, Decreased mobility, Decreased strength  Visit Diagnosis: Other muscle spasm  Muscle weakness (generalized)  Pain in thoracic spine  Cervicalgia     Problem List Patient Active Problem List   Diagnosis Date Noted  . Knee pain, right 05/21/2016  . Hamstring strain, sequela 05/21/2016  . Wart viral 05/12/2016  . Contusion of knee 05/05/2016  . Fall involving sidewalk curb 05/05/2016  . Aphasia 03/13/2016  . Shoulder pain, bilateral 12/11/2015  . Fibromyalgia 12/11/2015  . Post concussion syndrome 11/01/2015  . Clavicle fracture 10/30/2015  . Hair loss 10/11/2015  . Pain in joint, shoulder region 09/11/2015  . Right wrist pain 08/07/2015  . Splinter in skin 07/30/2015  . Fall from ladder 07/30/2015  . Skull fracture with concussion ([place]) 07/30/2015  . Fracture of cervical vertebra, C6 ([place]) 07/30/2015  . Thoracic vertebral fracture ([place]) 07/30/2015  . Abdominal pain, epigastric 05/07/2015  . Scaly patch rash 02/26/2015  . Mitral valve regurgitation 09/27/2014  . PAT (paroxysmal atrial tachycardia) ([place]) 09/06/2014  . Tachycardia 07/31/2014  . Sprain of ankle 06/29/2014  . Earache, left 05/15/2013  . Dizziness 05/15/2013  . Asthmatic bronchitis 05/08/2013  . Benign paroxysmal positional vertigo 05/08/2013  . Chest wall pain 01/26/2013  . LUQ  abdominal pain 01/20/2013  . Insomnia 01/10/2013  . Depression (emotion) 01/10/2013  . URI, acute 08/25/2012  . Snoring 08/25/2012  . IBS (irritable bowel syndrome) 02/08/2012  . Multiple pulmonary nodules 02/08/2012  . Chest heaviness 11/16/2010  . GERD 05/24/2010  . SINUSITIS, MAXILLARY, CHRONIC 05/20/2010  . Myalgia and myositis 11/20/2009  . Fatigue 11/20/2009  . ARTHRALGIA 06/26/2009  . Cervicogenic headache 06/26/2009  . TOBACCO USE, QUIT 06/26/2009  . LOW BACK PAIN 02/20/2009  . Rosacea 12/27/2008  . PRURITUS 12/27/2008  . Allergic urticaria 12/27/2008  . B12 deficiency 01/02/2008    Mikle Bosworth PTA 07/02/2016, 9:08 AM  [facility] [address], [place], [place], [address] Phone: [phone]   Fax:  [phone]  Name: Regina [name] MRN: [id number] Date of Birth: [birth year]

## 2016-07-06 ENCOUNTER — Ambulatory Visit: Payer: BC Managed Care – PPO | Admitting: Physical Therapy

## 2016-07-06 ENCOUNTER — Encounter: Payer: BC Managed Care – PPO | Admitting: Physical Therapy

## 2016-07-06 ENCOUNTER — Encounter: Payer: Self-pay | Admitting: Physical Therapy

## 2016-07-06 DIAGNOSIS — M62838 Other muscle spasm: Secondary | ICD-10-CM

## 2016-07-06 DIAGNOSIS — M6281 Muscle weakness (generalized): Secondary | ICD-10-CM

## 2016-07-06 DIAGNOSIS — M546 Pain in thoracic spine: Secondary | ICD-10-CM

## 2016-07-06 DIAGNOSIS — M542 Cervicalgia: Secondary | ICD-10-CM

## 2016-07-06 NOTE — Therapy (Signed)
[facility] [address], [place], [place], [address] Phone: [phone]   Fax:  [phone]  Physical Therapy Treatment  Patient Details  Name: Regina [name] MRN: [id number] Date of Birth: 06-Jul-1965 Referring Provider: Dr. Lew Dawes  Encounter Date: 07/06/2016      PT End of Session - 07/06/16 1520    Visit Number 25   Date for PT Re-Evaluation 08/05/16   PT Start Time 1449   PT Stop Time 1530   PT Time Calculation (min) 41 min   Activity Tolerance Patient tolerated treatment well   Behavior During Therapy [facility] for tasks assessed/performed      Past Medical History:  Diagnosis Date  . Allergic urticaria   . ARTHRALGIA   . Arthritis   . Atrial fibrillation ([place])   . B12 DEFICIENCY   . CFS (chronic fatigue syndrome)   . Endometriosis   . Epidural hemorrhage with loss of consciousness ([place]) 06/29/15  . Fibroid   . Fibromyalgia   . Fracture of cervical vertebra, C6 ([place]) 06/29/15  . Gastritis   . GERD   . Heart murmur   . History of endometriosis   . IBS (irritable bowel syndrome)   . Internal hemorrhoids   . MVP (mitral valve prolapse)   . Osteoarthritis   . Rosacea   . Thoracic compression fracture ([place]) 06/29/15   T2-T6    Past Surgical History:  Procedure Laterality Date  . ABDOMINAL HYSTERECTOMY     partial  . ABDOMINAL HYSTERECTOMY    . ANAL FISSURE REPAIR     Dr Rise [name] 2009  . APPENDECTOMY    . COLPOSCOPY    . HEMORRHOID SURGERY     Dr. Rise [name] 2009  . OVARIAN CYST REMOVAL    . RT BUNIONECTOMT    . TONSILLECTOMY AND ADENOIDECTOMY    . TUBAL LIGATION    . UTERINE FIBROID SURGERY      There were no vitals filed for this visit.      Subjective Assessment - 07/06/16 1453    Subjective Shoulders hurting. Last night pain was excrusiating, I was almost in tears. Not sure what caused it. Stabbing pain through the shoulder. Today shoulder feeling like normal aches and pains. Pt  reports having catch in lower right side of back and having increased vertigo symptoms.    Pertinent History fibromyalgia   Patient Stated Goals reduce pain, turn neck instead of body   Currently in Pain? Yes   Pain Score 2    Pain Location Neck   Pain Orientation Left;Right   Pain Descriptors / Indicators Dull   Pain Type Chronic pain   Pain Score 3   Pain Location Back   Pain Orientation Right;Left   Pain Descriptors / Indicators Sharp   Pain Type Chronic pain   Pain Onset More than a month ago   Pain Frequency Intermittent                         [facility] Adult PT Treatment/Exercise - 07/06/16 0001      Neck Exercises: Machines for Strengthening   UBE (Upper Arm Bike) Level 4 x 6 minutes (4/4)  Feeling tired today so less time, therapist present     Lumbar Exercises: Supine   Straight Leg Raise 10 reps;1 second   Isometric Hip Flexion 10 reps  Supine marches     Lumbar Exercises: Quadruped   Straight Leg Raise --  Modalities   Modalities Electrical Stimulation;Moist Heat     Moist Heat Therapy   Number Minutes Moist Heat 15 Minutes   Moist Heat Location Cervical  And lumbar     Electrical Stimulation   Electrical Stimulation Location bil neck/upper trap   Electrical Stimulation Action IFC   Electrical Stimulation Parameters 15 minutes   Electrical Stimulation Goals Pain     Manual Therapy   Manual Therapy Soft tissue mobilization   Manual therapy comments Pt supine   Soft tissue mobilization Suboccipitals, paraspinals, and cervical muscles; temporalis   Myofascial Release cervical muscles, suboccipitals, SCM; Bil upper traps                  PT Short Term Goals - 06/04/16 1711      PT SHORT TERM GOAL #1   Title independent with initial HEP   Status Achieved     PT SHORT TERM GOAL #2   Title headaches decreased in frequency by 25%   Status Achieved     PT SHORT TERM GOAL #3   Title ability to turn her head with pain  decreased >/= 25% due to increase tissue mobility   Status Achieved           PT Long Term Goals - 07/06/16 1527      PT LONG TERM GOAL #1   Title independent with HEP and understand how to progress herself   Time 6   Period Weeks   Status On-going     PT LONG TERM GOAL #3   Title turn neck with driving without increased pain   Period Weeks   Status On-going     PT LONG TERM GOAL #4   Title incresaed strength and decrease pain  to vacuum and mop floors in her house without needing to take a rest   Time 6   Period Weeks   Status On-going     PT LONG TERM GOAL #5   Title FOTO score </= 45% limitation   Time 12   Period Weeks   Status Achieved     PT LONG TERM GOAL #6   Title wear a back pack for work without incresaed pain   Time 6   Period Weeks   Status On-going               Plan - 07/06/16 1525    Clinical Impression Statement Pt reports intense pain in Rt shoulder last night but better today. Pt needing to leave right at end of therapy to get to work. Able to tolerate core activation exercises to improve core stability and decrease low back pain. Responded well with manual soft tissue mobilization and Estim. Pt will continue to benefit from skilled therapy for core and cervical stabiity.    Rehab Potential Good   Clinical Impairments Affecting Rehab Potential Fell on 06/29/2015 resulting with fractured skull, fractured T7, fractured shoulder, had 12 weeks of [facility] therapy   PT Frequency 2x / week   PT Duration 6 weeks   PT Treatment/Interventions Cryotherapy;Electrical Stimulation;Ultrasound;Traction;Moist Heat;Therapeutic activities;Therapeutic exercise;Neuromuscular re-education;Patient/family education;Passive range of motion;Manual techniques;Dry needling;Energy conservation;Taping   PT Next Visit Plan Continue strength for thoracic and UEs, cervcial A/ROM, manual and modalities.   Consulted and Agree with Plan of Care Patient      Patient  will benefit from skilled therapeutic intervention in order to improve the following deficits and impairments:  Decreased range of motion, Increased fascial restricitons, Decreased endurance, Increased muscle spasms, Pain, Hypomobility,  Impaired flexibility, Decreased mobility, Decreased strength  Visit Diagnosis: Other muscle spasm  Muscle weakness (generalized)  Pain in thoracic spine  Cervicalgia     Problem List Patient Active Problem List   Diagnosis Date Noted  . Knee pain, right 05/21/2016  . Hamstring strain, sequela 05/21/2016  . Wart viral 05/12/2016  . Contusion of knee 05/05/2016  . Fall involving sidewalk curb 05/05/2016  . Aphasia 03/13/2016  . Shoulder pain, bilateral 12/11/2015  . Fibromyalgia 12/11/2015  . Post concussion syndrome 11/01/2015  . Clavicle fracture 10/30/2015  . Hair loss 10/11/2015  . Pain in joint, shoulder region 09/11/2015  . Right wrist pain 08/07/2015  . Splinter in skin 07/30/2015  . Fall from ladder 07/30/2015  . Skull fracture with concussion ([place]) 07/30/2015  . Fracture of cervical vertebra, C6 ([place]) 07/30/2015  . Thoracic vertebral fracture ([place]) 07/30/2015  . Abdominal pain, epigastric 05/07/2015  . Scaly patch rash 02/26/2015  . Mitral valve regurgitation 09/27/2014  . PAT (paroxysmal atrial tachycardia) ([place]) 09/06/2014  . Tachycardia 07/31/2014  . Sprain of ankle 06/29/2014  . Earache, left 05/15/2013  . Dizziness 05/15/2013  . Asthmatic bronchitis 05/08/2013  . Benign paroxysmal positional vertigo 05/08/2013  . Chest wall pain 01/26/2013  . LUQ abdominal pain 01/20/2013  . Insomnia 01/10/2013  . Depression (emotion) 01/10/2013  . URI, acute 08/25/2012  . Snoring 08/25/2012  . IBS (irritable bowel syndrome) 02/08/2012  . Multiple pulmonary nodules 02/08/2012  . Chest heaviness 11/16/2010  . GERD 05/24/2010  . SINUSITIS, MAXILLARY, CHRONIC 05/20/2010  . Myalgia and myositis 11/20/2009  . Fatigue 11/20/2009  .  ARTHRALGIA 06/26/2009  . Cervicogenic headache 06/26/2009  . TOBACCO USE, QUIT 06/26/2009  . LOW BACK PAIN 02/20/2009  . Rosacea 12/27/2008  . PRURITUS 12/27/2008  . Allergic urticaria 12/27/2008  . B12 deficiency 01/02/2008    Mikle Bosworth PTA 07/06/2016, 3:36 PM  [facility] [address] [place], [place], [address] Phone: [phone]   Fax:  [phone]  Name: Regina [name] MRN: [id number] Date of Birth: Feb 10, 1966

## 2016-07-08 ENCOUNTER — Encounter: Payer: BC Managed Care – PPO | Admitting: Physical Therapy

## 2016-07-08 ENCOUNTER — Ambulatory Visit: Payer: BC Managed Care – PPO | Admitting: Physical Therapy

## 2016-07-10 NOTE — Progress Notes (Signed)
[facility] [employer] PRE[facility] Weekly Session   Patient Details  Name: EMAYA PRESTON MRN: [id number] Date of Birth: 06-Jun-1965 Age: 51 y.o. PCP: Walker Kehr, MD  Vitals:   07/10/16 1015  Weight: 168 lb (76.2 kg)        [employer] [employer] Weekly seesion - 07/10/16 1000      Weekly Session   Topic Discussed Finding support   Minutes exercised this week 120 minutes  cardio   Classes attended to date 1     Fun things you did since last meeting:"yard work" Things you are grateful for:"husband, job, grandkids" Nutrition celebrations for the week:"lost 2 lbs" Barriers:"food, unrealistic goals"   Vanita Ingles 07/10/2016, 11:58 AM

## 2016-07-13 ENCOUNTER — Ambulatory Visit: Payer: BC Managed Care – PPO

## 2016-07-13 DIAGNOSIS — M6281 Muscle weakness (generalized): Secondary | ICD-10-CM

## 2016-07-13 DIAGNOSIS — M542 Cervicalgia: Secondary | ICD-10-CM

## 2016-07-13 DIAGNOSIS — M62838 Other muscle spasm: Secondary | ICD-10-CM | POA: Diagnosis not present

## 2016-07-13 DIAGNOSIS — M546 Pain in thoracic spine: Secondary | ICD-10-CM

## 2016-07-13 NOTE — Therapy (Signed)
[facility] [address], [place], [place], [address] Phone: [phone]   Fax:  [phone]  Physical Therapy Treatment  Patient Details  Name: Regina [name] MRN: [id number] Date of Birth: 1965-11-20 Referring Provider: Dr. Lew Dawes  Encounter Date: 07/13/2016      PT End of Session - 07/13/16 1605    Visit Number 26   Date for PT Re-Evaluation 08/05/16   PT Start Time 1532   PT Stop Time 1624   PT Time Calculation (min) 52 min   Activity Tolerance Patient tolerated treatment well   Behavior During Therapy [facility] for tasks assessed/performed      Past Medical History:  Diagnosis Date  . Allergic urticaria   . ARTHRALGIA   . Arthritis   . Atrial fibrillation ([place])   . B12 DEFICIENCY   . CFS (chronic fatigue syndrome)   . Endometriosis   . Epidural hemorrhage with loss of consciousness ([place]) 06/29/15  . Fibroid   . Fibromyalgia   . Fracture of cervical vertebra, C6 ([place]) 06/29/15  . Gastritis   . GERD   . Heart murmur   . History of endometriosis   . IBS (irritable bowel syndrome)   . Internal hemorrhoids   . MVP (mitral valve prolapse)   . Osteoarthritis   . Rosacea   . Thoracic compression fracture ([place]) 06/29/15   T2-T6    Past Surgical History:  Procedure Laterality Date  . ABDOMINAL HYSTERECTOMY     partial  . ABDOMINAL HYSTERECTOMY    . ANAL FISSURE REPAIR     Dr Rise [name] 2009  . APPENDECTOMY    . COLPOSCOPY    . HEMORRHOID SURGERY     Dr. Rise [name] 2009  . OVARIAN CYST REMOVAL    . RT BUNIONECTOMT    . TONSILLECTOMY AND ADENOIDECTOMY    . TUBAL LIGATION    . UTERINE FIBROID SURGERY      There were no vitals filed for this visit.      Subjective Assessment - 07/13/16 1540    Subjective Pt reports that she has been feeling better overall.  Pt reports 85% overall improvement since the start of care.     Pertinent History fibromyalgia   Patient Stated Goals reduce pain, turn  neck instead of body   Currently in Pain? Yes   Pain Score 1    Pain Location Neck   Pain Orientation Left;Right   Pain Descriptors / Indicators Tightness   Pain Type Chronic pain   Pain Onset More than a month ago   Pain Frequency Constant   Aggravating Factors  turning head, sitting too long at computer   Pain Relieving Factors heat, rest                         [facility] Adult PT Treatment/Exercise - 07/13/16 0001      Neck Exercises: Machines for Strengthening   UBE (Upper Arm Bike) Level 4 x 8 minutes (4/4)   therapist present     Neck Exercises: Standing   Other Standing Exercises snow angels on wall x10     Neck Exercises: Seated   Upper Extremity D2 Flexion;20 reps;Theraband   Theraband Level (UE D2) Level 3 (Green)   Other Seated Exercise ER with green band: 2x10     Lumbar Exercises: Stretches   Active Hamstring Stretch 2 reps;10 seconds     Lumbar Exercises: Standing   Row AROM;Strengthening;20 reps  25#  Shoulder Extension AROM;Both;Strengthening;20 reps   Shoulder Extension Limitations 25#     Lumbar Exercises: Seated   Other Seated Lumbar Exercises Seated UE raises  flexion, abduction, scaption #2     Modalities   Modalities Electrical Stimulation;Moist Heat     Moist Heat Therapy   Number Minutes Moist Heat 15 Minutes   Moist Heat Location Cervical  And lumbar     Electrical Stimulation   Electrical Stimulation Location bil neck/upper trap   Electrical Stimulation Action IFC   Electrical Stimulation Parameters 15 minutes   Electrical Stimulation Goals Pain                  PT Short Term Goals - 06/04/16 1711      PT SHORT TERM GOAL #1   Title independent with initial HEP   Status Achieved     PT SHORT TERM GOAL #2   Title headaches decreased in frequency by 25%   Status Achieved     PT SHORT TERM GOAL #3   Title ability to turn her head with pain decreased >/= 25% due to increase tissue mobility   Status  Achieved           PT Long Term Goals - 07/13/16 1541      PT LONG TERM GOAL #1   Title independent with HEP and understand how to progress herself   Time 6   Period Weeks   Status On-going     PT LONG TERM GOAL #2   Title ability to look upward into a cabinet with >/= 50% greater ease due to increase mobility and decreased pain.   Status Achieved     PT LONG TERM GOAL #3   Title turn neck with driving without increased pain   Period Weeks   Status On-going     PT LONG TERM GOAL #4   Title incresaed strength and decrease pain  to vacuum and mop floors in her house without needing to take a rest   Time 6   Period Weeks   Status On-going     PT LONG TERM GOAL #6   Title wear a back pack for work without incresaed pain   Time 6   Period Weeks   Status On-going               Plan - 07/13/16 1543    Clinical Impression Statement Pt reports 80% overall improvement in neck symptoms since the start of care.  Pt reports 1/10 neck pain with turning head.  Pt requires rest breaks with housework.  Pt had increased pain after 5 consecutive days at work last week.  Pt will continue to benefit from skilled PT for strength, mobility, manual and modalities.   Clinical Impairments Affecting Rehab Potential Fell on 06/29/2015 resulting with fractured skull, fractured T7, fractured shoulder, had 12 weeks of [facility] therapy   PT Frequency 2x / week   PT Duration 6 weeks   PT Treatment/Interventions Cryotherapy;Electrical Stimulation;Ultrasound;Traction;Moist Heat;Therapeutic activities;Therapeutic exercise;Neuromuscular re-education;Patient/family education;Passive range of motion;Manual techniques;Dry needling;Energy conservation;Taping   PT Next Visit Plan Continue strength for thoracic and UEs, cervcial A/ROM, manual and modalities.   Consulted and Agree with Plan of Care Patient      Patient will benefit from skilled therapeutic intervention in order to improve the  following deficits and impairments:  Decreased range of motion, Increased fascial restricitons, Decreased endurance, Increased muscle spasms, Pain, Hypomobility, Impaired flexibility, Decreased mobility, Decreased strength  Visit Diagnosis: Other muscle spasm  Muscle weakness (generalized)  Pain in thoracic spine  Cervicalgia     Problem List Patient Active Problem List   Diagnosis Date Noted  . Knee pain, right 05/21/2016  . Hamstring strain, sequela 05/21/2016  . Wart viral 05/12/2016  . Contusion of knee 05/05/2016  . Fall involving sidewalk curb 05/05/2016  . Aphasia 03/13/2016  . Shoulder pain, bilateral 12/11/2015  . Fibromyalgia 12/11/2015  . Post concussion syndrome 11/01/2015  . Clavicle fracture 10/30/2015  . Hair loss 10/11/2015  . Pain in joint, shoulder region 09/11/2015  . Right wrist pain 08/07/2015  . Splinter in skin 07/30/2015  . Fall from ladder 07/30/2015  . Skull fracture with concussion ([place]) 07/30/2015  . Fracture of cervical vertebra, C6 ([place]) 07/30/2015  . Thoracic vertebral fracture ([place]) 07/30/2015  . Abdominal pain, epigastric 05/07/2015  . Scaly patch rash 02/26/2015  . Mitral valve regurgitation 09/27/2014  . PAT (paroxysmal atrial tachycardia) ([place]) 09/06/2014  . Tachycardia 07/31/2014  . Sprain of ankle 06/29/2014  . Earache, left 05/15/2013  . Dizziness 05/15/2013  . Asthmatic bronchitis 05/08/2013  . Benign paroxysmal positional vertigo 05/08/2013  . Chest wall pain 01/26/2013  . LUQ abdominal pain 01/20/2013  . Insomnia 01/10/2013  . Depression (emotion) 01/10/2013  . URI, acute 08/25/2012  . Snoring 08/25/2012  . IBS (irritable bowel syndrome) 02/08/2012  . Multiple pulmonary nodules 02/08/2012  . Chest heaviness 11/16/2010  . GERD 05/24/2010  . SINUSITIS, MAXILLARY, CHRONIC 05/20/2010  . Myalgia and myositis 11/20/2009  . Fatigue 11/20/2009  . ARTHRALGIA 06/26/2009  . Cervicogenic headache 06/26/2009  . TOBACCO USE, QUIT  06/26/2009  . LOW BACK PAIN 02/20/2009  . Rosacea 12/27/2008  . PRURITUS 12/27/2008  . Allergic urticaria 12/27/2008  . B12 deficiency 01/02/2008    Sigurd Sos, PT 07/13/16 4:10 PM  [facility] [address], [place], [place], [address] Phone: [phone]   Fax:  [phone]  Name: Regina [name] MRN: [id number] Date of Birth: 1966-02-04

## 2016-07-14 DIAGNOSIS — Z0289 Encounter for other administrative examinations: Secondary | ICD-10-CM

## 2016-07-15 ENCOUNTER — Ambulatory Visit: Payer: BC Managed Care – PPO | Admitting: Physical Therapy

## 2016-07-15 ENCOUNTER — Encounter: Payer: Self-pay | Admitting: Physical Therapy

## 2016-07-15 DIAGNOSIS — M62838 Other muscle spasm: Secondary | ICD-10-CM

## 2016-07-15 DIAGNOSIS — M542 Cervicalgia: Secondary | ICD-10-CM

## 2016-07-15 DIAGNOSIS — M6281 Muscle weakness (generalized): Secondary | ICD-10-CM

## 2016-07-15 DIAGNOSIS — M546 Pain in thoracic spine: Secondary | ICD-10-CM

## 2016-07-15 NOTE — Therapy (Signed)
[facility] [address], [place], [place], [address] Phone: [phone]   Fax:  [phone]  Physical Therapy Treatment  Patient Details  Name: Regina [name] MRN: [id number] Date of Birth: Jun 08, 1965 Referring Provider: Dr. Lew Dawes  Encounter Date: 07/15/2016      PT End of Session - 07/15/16 1538    Visit Number 27   Date for PT Re-Evaluation 08/05/16   PT Start Time 1534   PT Stop Time 1625   PT Time Calculation (min) 51 min   Activity Tolerance Patient tolerated treatment well   Behavior During Therapy [facility] for tasks assessed/performed      Past Medical History:  Diagnosis Date  . Allergic urticaria   . ARTHRALGIA   . Arthritis   . Atrial fibrillation ([place])   . B12 DEFICIENCY   . CFS (chronic fatigue syndrome)   . Endometriosis   . Epidural hemorrhage with loss of consciousness ([place]) 06/29/15  . Fibroid   . Fibromyalgia   . Fracture of cervical vertebra, C6 ([place]) 06/29/15  . Gastritis   . GERD   . Heart murmur   . History of endometriosis   . IBS (irritable bowel syndrome)   . Internal hemorrhoids   . MVP (mitral valve prolapse)   . Osteoarthritis   . Rosacea   . Thoracic compression fracture ([place]) 06/29/15   T2-T6    Past Surgical History:  Procedure Laterality Date  . ABDOMINAL HYSTERECTOMY     partial  . ABDOMINAL HYSTERECTOMY    . ANAL FISSURE REPAIR     Dr Rise [name] 2009  . APPENDECTOMY    . COLPOSCOPY    . HEMORRHOID SURGERY     Dr. Rise [name] 2009  . OVARIAN CYST REMOVAL    . RT BUNIONECTOMT    . TONSILLECTOMY AND ADENOIDECTOMY    . TUBAL LIGATION    . UTERINE FIBROID SURGERY      There were no vitals filed for this visit.      Subjective Assessment - 07/15/16 1535    Subjective It don't feel great. My right hamstring, knee, and my wrist. I worked out with my trainer yesterday so I don't know if the pain is normal from exercise or something else.    Pertinent  History fibromyalgia   Patient Stated Goals reduce pain, turn neck instead of body   Currently in Pain? Yes   Pain Score 1    Pain Location Neck   Pain Orientation Left;Right   Pain Descriptors / Indicators Tightness   Pain Type Chronic pain                         [facility] Adult PT Treatment/Exercise - 07/15/16 0001      Neck Exercises: Machines for Strengthening   UBE (Upper Arm Bike) Level 4 x 8 minutes (4/4)   therapist present     Neck Exercises: Standing   Other Standing Exercises snow angels on wall x10     Neck Exercises: Seated   Upper Extremity D1 Extension;20 reps;Theraband   Theraband Level (UE D1) Level 1 (Yellow)   Upper Extremity D2 Flexion;20 reps;Theraband   Theraband Level (UE D2) Level 3 (Green)   Other Seated Exercise ER with green band: 2x10     Lumbar Exercises: Stretches   Active Hamstring Stretch 2 reps;10 seconds     Lumbar Exercises: Standing   Other Standing Lumbar Exercises 3 directions hip raises standing  2x10  VC for abdominal brace and level hips     Lumbar Exercises: Seated   Other Seated Lumbar Exercises Seated UE raises  flexion, abduction, scaption #2     Modalities   Modalities Electrical Stimulation;Moist Heat     Moist Heat Therapy   Number Minutes Moist Heat 15 Minutes   Moist Heat Location Cervical  And lumbar     Electrical Stimulation   Electrical Stimulation Location bil neck/upper trap   Electrical Stimulation Action IFC   Electrical Stimulation Parameters 15 minutes   Electrical Stimulation Goals Pain                  PT Short Term Goals - 06/04/16 1711      PT SHORT TERM GOAL #1   Title independent with initial HEP   Status Achieved     PT SHORT TERM GOAL #2   Title headaches decreased in frequency by 25%   Status Achieved     PT SHORT TERM GOAL #3   Title ability to turn her head with pain decreased >/= 25% due to increase tissue mobility   Status Achieved           PT Long  Term Goals - 07/13/16 1541      PT LONG TERM GOAL #1   Title independent with HEP and understand how to progress herself   Time 6   Period Weeks   Status On-going     PT LONG TERM GOAL #2   Title ability to look upward into a cabinet with >/= 50% greater ease due to increase mobility and decreased pain.   Status Achieved     PT LONG TERM GOAL #3   Title turn neck with driving without increased pain   Period Weeks   Status On-going     PT LONG TERM GOAL #4   Title incresaed strength and decrease pain  to vacuum and mop floors in her house without needing to take a rest   Time 6   Period Weeks   Status On-going     PT LONG TERM GOAL #6   Title wear a back pack for work without incresaed pain   Time 6   Period Weeks   Status On-going               Plan - 07/15/16 1612    Clinical Impression Statement Pt reports neck is doing ok today with minal pain. Most pain is in knee, hamstring, and wrist. Pt is seeing orthopedic MD tomorrow and is hoping to get LE and wrist added to POC. Pt able to tolerate all exercises well. Pt will continue to benefit from skilled therapy for core strength and cervical stability.    Rehab Potential Good   Clinical Impairments Affecting Rehab Potential Fell on 06/29/2015 resulting with fractured skull, fractured T7, fractured shoulder, had 12 weeks of [facility] Physical therapy   PT Frequency 2x / week   PT Duration 6 weeks   PT Treatment/Interventions Cryotherapy;Electrical Stimulation;Ultrasound;Traction;Moist Heat;Therapeutic activities;Therapeutic exercise;Neuromuscular re-education;Patient/family education;Passive range of motion;Manual techniques;Dry needling;Energy conservation;Taping   PT Next Visit Plan Continue strength for thoracic and UEs, cervcial A/ROM, manual and modalities.      Patient will benefit from skilled therapeutic intervention in order to improve the following deficits and impairments:  Decreased range of motion, Increased  fascial restricitons, Decreased endurance, Increased muscle spasms, Pain, Hypomobility, Impaired flexibility, Decreased mobility, Decreased strength  Visit Diagnosis: Other muscle spasm  Muscle weakness (generalized)  Pain in thoracic  spine  Cervicalgia     Problem List Patient Active Problem List   Diagnosis Date Noted  . Knee pain, right 05/21/2016  . Hamstring strain, sequela 05/21/2016  . Wart viral 05/12/2016  . Contusion of knee 05/05/2016  . Fall involving sidewalk curb 05/05/2016  . Aphasia 03/13/2016  . Shoulder pain, bilateral 12/11/2015  . Fibromyalgia 12/11/2015  . Post concussion syndrome 11/01/2015  . Clavicle fracture 10/30/2015  . Hair loss 10/11/2015  . Pain in joint, shoulder region 09/11/2015  . Right wrist pain 08/07/2015  . Splinter in skin 07/30/2015  . Fall from ladder 07/30/2015  . Skull fracture with concussion ([place]) 07/30/2015  . Fracture of cervical vertebra, C6 ([place]) 07/30/2015  . Thoracic vertebral fracture ([place]) 07/30/2015  . Abdominal pain, epigastric 05/07/2015  . Scaly patch rash 02/26/2015  . Mitral valve regurgitation 09/27/2014  . PAT (paroxysmal atrial tachycardia) ([place]) 09/06/2014  . Tachycardia 07/31/2014  . Sprain of ankle 06/29/2014  . Earache, left 05/15/2013  . Dizziness 05/15/2013  . Asthmatic bronchitis 05/08/2013  . Benign paroxysmal positional vertigo 05/08/2013  . Chest wall pain 01/26/2013  . LUQ abdominal pain 01/20/2013  . Insomnia 01/10/2013  . Depression (emotion) 01/10/2013  . URI, acute 08/25/2012  . Snoring 08/25/2012  . IBS (irritable bowel syndrome) 02/08/2012  . Multiple pulmonary nodules 02/08/2012  . Chest heaviness 11/16/2010  . GERD 05/24/2010  . SINUSITIS, MAXILLARY, CHRONIC 05/20/2010  . Myalgia and myositis 11/20/2009  . Fatigue 11/20/2009  . ARTHRALGIA 06/26/2009  . Cervicogenic headache 06/26/2009  . TOBACCO USE, QUIT 06/26/2009  . LOW BACK PAIN 02/20/2009  . Rosacea 12/27/2008  .  PRURITUS 12/27/2008  . Allergic urticaria 12/27/2008  . B12 deficiency 01/02/2008    Mikle Bosworth PTA 07/15/2016, 4:50 PM  [facility] [address] [place], [place], [address] Phone: [phone]   Fax:  [phone]  Name: Regina [name] MRN: [id number] Date of Birth: 1966/03/22

## 2016-07-15 NOTE — Progress Notes (Signed)
[name] [address], [place] [address] Phone: [phone] Subjective:    I'm seeing this patient by the request  of:    CC: right wrist pain Right knee pain  [id number]  Regina [name] is a 51 y.o. female coming in with complaint of Right wrist pain. Patient has been having pain for a long time. Patient did have a fall. Patient had what appeared to be a TFCC tear but MRI was unremarkable. Patient states that the pain seems to continue to have pain. Seems to be on the dorsal aspect of the wrist. Secondly severe and can radiate up towards her neck. Patient did have the neck fracture but states that she seems to be doing much better overall. Patient denies any numbness, tingling, any weakness but states that she uses the wrist is significant a less. Has trouble actually extending the wrist.  Patient is also having right knee pain. Saw no provider about 2 months ago. Was having pain in the posterior aspect of the knee. Continues to have some. Did have a hamstring injury from a fall. Patient states that she is not making any significant progress. Feels that the pain is more in her calf and on the posterior aspect of the knee. Has not notice any swelling but states that the pain is worse with activity as well as without activity. Area and patient tries to workout but finds it difficult secondary to the pain.     Past Medical History:  Diagnosis Date  . Allergic urticaria   . ARTHRALGIA   . Arthritis   . Atrial fibrillation ([place])   . B12 DEFICIENCY   . CFS (chronic fatigue syndrome)   . Endometriosis   . Epidural hemorrhage with loss of consciousness ([place]) 06/29/15  . Fibroid   . Fibromyalgia   . Fracture of cervical vertebra, C6 ([place]) 06/29/15  . Gastritis   . GERD   . Heart murmur   . History of endometriosis   . IBS (irritable bowel syndrome)   . Internal hemorrhoids   . MVP (mitral valve prolapse)   . Osteoarthritis   . Rosacea   .  Thoracic compression fracture ([place]) 06/29/15   T2-T6   Past Surgical History:  Procedure Laterality Date  . ABDOMINAL HYSTERECTOMY     partial  . ABDOMINAL HYSTERECTOMY    . ANAL FISSURE REPAIR     Dr Rise [name] 2009  . APPENDECTOMY    . COLPOSCOPY    . HEMORRHOID SURGERY     Dr. Rise [name] 2009  . OVARIAN CYST REMOVAL    . RT BUNIONECTOMT    . TONSILLECTOMY AND ADENOIDECTOMY    . TUBAL LIGATION    . UTERINE FIBROID SURGERY     Social History   Social History  . Marital status: Married    Spouse name: N/A  . Number of children: N/A  . Years of education: N/A   Social History Main Topics  . Smoking status: Former Smoker    Quit date: 10/31/1988  . Smokeless tobacco: Never Used  . Alcohol use 0.6 - 1.2 oz/week    1 - 2 Standard drinks or equivalent per week     Comment: occasional   . Drug use: No  . Sexual activity: Yes    Partners: Male   Other Topics Concern  . None   Social History Narrative   ** Merged History Encounter **       Working 2 jobs, 6d/wk  Regular exercise-no      Divorced   Allergies  Allergen Reactions  . Indocin [Indomethacin] Swelling, Rash and Other (See Comments)    REACTION: lupus like illness; butterfly rash, pain and swelling all over body.   . Morphine Sulfate Nausea And Vomiting  . Sulfacetamide Sodium Swelling  . Caffeine Other (See Comments)    Induces A-fib  . Morphine And Related     nausea  . Sulfa Antibiotics Swelling  . Azithromycin Rash    ALL MYCIN DRUGS  . Erythromycin Stearate Hives and Swelling    REACTION: all mycins   Family History  Problem Relation Age of Onset  . Lung cancer Mother   . Thyroid cancer Mother   . Cancer Mother     INTESTINAL  . Healthy Father   . Diabetes      uncles/aunts  . Von Willebrand disease Daughter   . Asthma Neg Hx     Past medical history, social, surgical and family history all reviewed in electronic medical record.  No pertanent information unless stated regarding to  the chief complaint.   Review of Systems:Review of systems updated and as accurate as of 07/16/16  No headache, visual changes, nausea, vomiting, diarrhea, constipation, dizziness, abdominal pain, skin rash, fevers, chills, night sweats, weight loss, swollen lymph nodes, body aches, joint swelling, muscle aches, chest pain, shortness of breath, mood changes.   Objective  Blood pressure 118/60, pulse 68, resp. rate 16, weight 169 lb (76.7 kg), SpO2 99 %.   Systems examined below as of 07/16/16 General: NAD A&O x3 mood, affect normal  HEENT: Pupils equal, extraocular movements intact no nystagmus Respiratory: not short of breath at rest or with speaking Cardiovascular: No lower extremity edema, non tender Skin: Warm dry intact with no signs of infection or rash on extremities or on axial skeleton. Abdomen: Soft nontender, no masses Neuro: Cranial nerves  intact, neurovascularly intact in all extremities with 2+ DTRs and 2+ pulses. Lymph: No lymphadenopathy appreciated today  Gait normal with good balance and coordination.  MSK: Non tender with full range of motion and good stability and symmetric strength and tone of shoulders, elbows, ,  hips and ankles bilaterally.   Wrist: Right Inspection normal with no visible erythema or swelling. She does have what appears to be a dorsal cyst noted Patient does have limitation in the extension of the worst Diffusely Tenderness of the wrist. No snuffbox tenderness. No tenderness over [place]. Strength 5/5 in all directions without pain. Negative Finkelstein, tinel's and phalens. Positive Watson test Contralateral wrist unremarkable  Knee: Right Normal to inspection with no erythema or effusion or obvious bony abnormalities. Really tender to palpation over the calf. Positive squeeze test ROM full in flexion and extension and lower leg rotation. Ligaments with solid consistent endpoints including ACL, PCL, LCL, MCL. Negative Mcmurray's,  Apley's, and Thessalonian tests. Mild painful patellar compression. Patellar glide without crepitus. Patellar and quadriceps tendons unremarkable. Hamstring and quadriceps strength is normal.  Contralateral knee unremarkable  MSK US performed of: Right wrist This study was ordered, performed, and interpreted by Charlann Boxer D.O.  Wrist: All extensor compartments visualized and tendons all normal but patient does have what appears to be a will set. Meniscal cyst over the TFCC. No effusion seen.  IMPRESSION:  Potential dorsal cyst noted. Seems to be right over the TFCC.     Impression and Recommendations:     This case required medical decision making of moderate complexity.  Note: This dictation was prepared with Dragon dictation along with smaller phrase technology. Any transcriptional errors that result from this process are unintentional.

## 2016-07-16 ENCOUNTER — Ambulatory Visit (INDEPENDENT_AMBULATORY_CARE_PROVIDER_SITE_OTHER): Payer: BC Managed Care – PPO | Admitting: Family Medicine

## 2016-07-16 ENCOUNTER — Ambulatory Visit: Payer: Self-pay

## 2016-07-16 ENCOUNTER — Encounter: Payer: Self-pay | Admitting: Family Medicine

## 2016-07-16 ENCOUNTER — Encounter (HOSPITAL_COMMUNITY): Payer: Self-pay

## 2016-07-16 ENCOUNTER — Ambulatory Visit (HOSPITAL_COMMUNITY)
Admission: RE | Admit: 2016-07-16 | Discharge: 2016-07-16 | Disposition: A | Discharge status: Home / Self Care | Payer: BC Managed Care – PPO | Source: Ambulatory Visit | Attending: Cardiology | Admitting: Cardiology

## 2016-07-16 VITALS — BP 118/60 | HR 68 | Resp 16 | Wt 169.0 lb

## 2016-07-16 DIAGNOSIS — M79604 Pain in right leg: Secondary | ICD-10-CM | POA: Insufficient documentation

## 2016-07-16 DIAGNOSIS — Z8249 Family history of ischemic heart disease and other diseases of the circulatory system: Secondary | ICD-10-CM | POA: Insufficient documentation

## 2016-07-16 DIAGNOSIS — M25561 Pain in right knee: Secondary | ICD-10-CM | POA: Insufficient documentation

## 2016-07-16 DIAGNOSIS — Z87891 Personal history of nicotine dependence: Secondary | ICD-10-CM | POA: Diagnosis not present

## 2016-07-16 DIAGNOSIS — M25531 Pain in right wrist: Secondary | ICD-10-CM

## 2016-07-16 DIAGNOSIS — G8929 Other chronic pain: Secondary | ICD-10-CM | POA: Insufficient documentation

## 2016-07-16 NOTE — Patient Instructions (Addendum)
Good to see you  We will aspirate the cyst when you are ready  We will get doppler of the right leg to make sure you are doing ok. If you want we can get EMG as well to make sure not the neck but probably the wrist  Thigh compression sleeve can help  See me again when you are ready

## 2016-07-16 NOTE — Progress Notes (Signed)
Today's right lower extremity venous duplex is negative for DVT. Preliminary results given to Dr. Hulan Saas.

## 2016-07-16 NOTE — Progress Notes (Signed)
Pre-visit discussion using our clinic review tool. No additional management support is needed unless otherwise documented below in the visit note.  

## 2016-07-16 NOTE — Assessment & Plan Note (Signed)
Patient does have what appears to be a cyst noted. We discussed the possibility notes drainage which patient declined. Discussed icing regimen, discussed topical medicine. We discussed EMG to further rule out any type of nerve impingement from patient's previous call fracture. Patient will consider this as well. Patient will come back and see me again if she decides that she would like to further do one of these procedures.  Spent  25 minutes with patient face-to-face and had greater than 50% of counseling including as described above in assessment and plan.

## 2016-07-20 ENCOUNTER — Emergency Department (HOSPITAL_BASED_OUTPATIENT_CLINIC_OR_DEPARTMENT_OTHER)
Admission: EM | Admit: 2016-07-20 | Discharge: 2016-07-20 | Disposition: A | Discharge status: Home / Self Care | Payer: BC Managed Care – PPO | Attending: Emergency Medicine | Admitting: Emergency Medicine

## 2016-07-20 ENCOUNTER — Telehealth: Payer: Self-pay | Admitting: Internal Medicine

## 2016-07-20 ENCOUNTER — Emergency Department (HOSPITAL_BASED_OUTPATIENT_CLINIC_OR_DEPARTMENT_OTHER): Payer: BC Managed Care – PPO

## 2016-07-20 ENCOUNTER — Encounter (HOSPITAL_BASED_OUTPATIENT_CLINIC_OR_DEPARTMENT_OTHER): Payer: Self-pay | Admitting: *Deleted

## 2016-07-20 ENCOUNTER — Encounter: Payer: BC Managed Care – PPO | Admitting: Physical Therapy

## 2016-07-20 DIAGNOSIS — Z87891 Personal history of nicotine dependence: Secondary | ICD-10-CM | POA: Insufficient documentation

## 2016-07-20 DIAGNOSIS — R202 Paresthesia of skin: Secondary | ICD-10-CM | POA: Diagnosis not present

## 2016-07-20 DIAGNOSIS — J45909 Unspecified asthma, uncomplicated: Secondary | ICD-10-CM | POA: Diagnosis not present

## 2016-07-20 DIAGNOSIS — R0602 Shortness of breath: Secondary | ICD-10-CM | POA: Insufficient documentation

## 2016-07-20 DIAGNOSIS — Z79899 Other long term (current) drug therapy: Secondary | ICD-10-CM | POA: Diagnosis not present

## 2016-07-20 LAB — BASIC METABOLIC PANEL
ANION GAP: 5 (ref 5–15)
BUN: 11 mg/dL (ref 6–20)
CHLORIDE: 104 mmol/L (ref 101–111)
CO2: 27 mmol/L (ref 22–32)
Calcium: 8.8 mg/dL — ABNORMAL LOW (ref 8.9–10.3)
Creatinine, Ser: 0.68 mg/dL (ref 0.44–1.00)
GFR calc Af Amer: >60 mL/min (ref >60)
GFR calc non Af Amer: >60 mL/min (ref >60)
GLUCOSE: 92 mg/dL (ref 65–99)
POTASSIUM: 3.6 mmol/L (ref 3.5–5.1)
Sodium: 136 mmol/L (ref 135–145)

## 2016-07-20 LAB — CBC WITH DIFFERENTIAL/PLATELET
BASOS ABS: 0 10*3/uL (ref 0.0–0.1)
Basophils Relative: 0 %
Eosinophils Absolute: 0 10*3/uL (ref 0.0–0.7)
Eosinophils Relative: 0 %
HEMATOCRIT: 37.2 % (ref 36.0–46.0)
Hemoglobin: 12.6 g/dL (ref 12.0–15.0)
LYMPHS PCT: 29 %
Lymphs Abs: 2 10*3/uL (ref 0.7–4.0)
MCH: 29.4 pg (ref 26.0–34.0)
MCHC: 33.9 g/dL (ref 30.0–36.0)
MCV: 86.9 fL (ref 78.0–100.0)
MONO ABS: 0.5 10*3/uL (ref 0.1–1.0)
MONOS PCT: 7 %
NEUTROS ABS: 4.3 10*3/uL (ref 1.7–7.7)
NEUTROS PCT: 64 %
Platelets: 305 10*3/uL (ref 150–400)
RBC: 4.28 MIL/uL (ref 3.87–5.11)
RDW: 12.8 % (ref 11.5–15.5)
WBC: 6.8 10*3/uL (ref 4.0–10.5)

## 2016-07-20 LAB — TROPONIN I
Troponin I: <0.03 ng/mL (ref <0.03)
Troponin I: <0.03 ng/mL (ref <0.03)

## 2016-07-20 NOTE — Telephone Encounter (Signed)
Patient Name: Regina [name]  DOB: 1965-10-15    Initial Comment Caller has sensation in left arm, fractures spine last year, has normal tingling sensations but this feels different. Was seen on Thursday but doctor could not find anything, today has shortness of breath   Nurse Assessment  Nurse: Raphael Gibney, RN, Vanita Ingles Date/Time (Eastern Time): 07/20/2016 9:53:37 AM  Confirm and document reason for call. If symptomatic, describe symptoms. ---Caller states she has numbness in her left arm. Fingers curl under. Had spinal fracture last year. She saw Dr. Tamala Julian last Thursday and who thought she had a blood clot in her leg and they did a doppler which was negative. Has SOB with activity. Having heartburn symptoms but not now. Had heart burn last night. Had burning in the center of her chest. has coughing during the burning.  Does the patient have any new or worsening symptoms? ---Yes  Will a triage be completed? ---Yes  Related visit to physician within the last 2 weeks? ---No  Does the PT have any chronic conditions? (i.e. diabetes, asthma, etc.) ---Yes  List chronic conditions. ---A fib  Is the patient pregnant or possibly pregnant? (Ask all females between the ages of 38-55) ---No  Is this a behavioral health or substance abuse call? ---No     Guidelines    Guideline Title Affirmed Question Affirmed Notes  Chest Pain Chest pain lasts > 5 minutes (Exceptions: chest pain occurring > 3 days ago and now asymptomatic; same as previously diagnosed heartburn and has accompanying sour taste in mouth)    Final Disposition User   Go to ED Now (or PCP triage) Raphael Gibney, RN, Vera    Comments  Dr. Alain Marion does not have available appt within 4 hrs. Pt states she will go to urgent care.   Referrals  GO TO FACILITY UNDECIDED   Disagree/Comply: Comply

## 2016-07-20 NOTE — Discharge Instructions (Signed)
Read the information below.  You may return to the Emergency Department at any time for worsening condition or any new symptoms that concern you.   If you develop chest pain, worsening shortness of breath, fever, you pass out, you have weakness in your arms or legs, or become extremely lightheaded or dizzy, return to the ER for a recheck.

## 2016-07-20 NOTE — ED Provider Notes (Signed)
[facility] Provider Note   CSN: [id number] Arrival date & time: 07/20/16  1039     History   Chief Complaint Chief Complaint  Patient presents with  . Numbness  . Shortness of Breath    HPI Regina [name] is a 51 y.o. female.  HPI   Pt with hx afib, fractured cervical and thoracic vertebrae, skull fracture, fibromyalgia, chronic fatigue, vertigo p/w episode of left arm paresthesia, described as "feeling like the fingers are curling under but they are not" as well as SOB with walking and left scapular pain x 10 minutes while in the ED.  Also c/o mild occipital headache that also began while in the ED.  The SOB is described as feeling "like I need to breathe deeper," denies any pain with breathing.  The numbness in her arm began at 8:30am.  The SOB occurred while she was walking, passed quickly, then reoccured after walking up the steps later.  She reports she does that routine frequently without these symptoms./     Denies recent immobilization, exogenous estrogen, leg swelling, history of blood clots.   Had recent LE DVT study that was negative.    Past Medical History:  Diagnosis Date  . Allergic urticaria   . ARTHRALGIA   . Arthritis   . Atrial fibrillation ([place])   . B12 DEFICIENCY   . CFS (chronic fatigue syndrome)   . Endometriosis   . Epidural hemorrhage with loss of consciousness ([place]) 06/29/15  . Fibroid   . Fibromyalgia   . Fracture of cervical vertebra, C6 ([place]) 06/29/15  . Gastritis   . GERD   . Heart murmur   . History of endometriosis   . IBS (irritable bowel syndrome)   . Internal hemorrhoids   . MVP (mitral valve prolapse)   . Osteoarthritis   . Rosacea   . Thoracic compression fracture ([place]) 06/29/15   T2-T6    Patient Active Problem List   Diagnosis Date Noted  . Posterior right knee pain 07/16/2016  . Knee pain, right 05/21/2016  . Hamstring strain, sequela 05/21/2016  . Wart viral 05/12/2016  . Contusion of knee 05/05/2016  . Fall  involving sidewalk curb 05/05/2016  . Aphasia 03/13/2016  . Shoulder pain, bilateral 12/11/2015  . Fibromyalgia 12/11/2015  . Post concussion syndrome 11/01/2015  . Clavicle fracture 10/30/2015  . Hair loss 10/11/2015  . Pain in joint, shoulder region 09/11/2015  . Right wrist pain 08/07/2015  . Splinter in skin 07/30/2015  . Fall from ladder 07/30/2015  . Skull fracture with concussion ([place]) 07/30/2015  . Fracture of cervical vertebra, C6 ([place]) 07/30/2015  . Thoracic vertebral fracture ([place]) 07/30/2015  . Abdominal pain, epigastric 05/07/2015  . Scaly patch rash 02/26/2015  . Mitral valve regurgitation 09/27/2014  . PAT (paroxysmal atrial tachycardia) ([place]) 09/06/2014  . Tachycardia 07/31/2014  . Sprain of ankle 06/29/2014  . Earache, left 05/15/2013  . Dizziness 05/15/2013  . Asthmatic bronchitis 05/08/2013  . Benign paroxysmal positional vertigo 05/08/2013  . Chest wall pain 01/26/2013  . LUQ abdominal pain 01/20/2013  . Insomnia 01/10/2013  . Depression (emotion) 01/10/2013  . URI, acute 08/25/2012  . Snoring 08/25/2012  . IBS (irritable bowel syndrome) 02/08/2012  . Multiple pulmonary nodules 02/08/2012  . Chest heaviness 11/16/2010  . GERD 05/24/2010  . SINUSITIS, MAXILLARY, CHRONIC 05/20/2010  . Myalgia and myositis 11/20/2009  . Fatigue 11/20/2009  . ARTHRALGIA 06/26/2009  . Cervicogenic headache 06/26/2009  . TOBACCO USE, QUIT 06/26/2009  . LOW  BACK PAIN 02/20/2009  . Rosacea 12/27/2008  . PRURITUS 12/27/2008  . Allergic urticaria 12/27/2008  . B12 deficiency 01/02/2008    Past Surgical History:  Procedure Laterality Date  . ABDOMINAL HYSTERECTOMY     partial  . ABDOMINAL HYSTERECTOMY    . ANAL FISSURE REPAIR     Dr Rise [name] 2009  . APPENDECTOMY    . COLPOSCOPY    . HEMORRHOID SURGERY     Dr. Rise [name] 2009  . OVARIAN CYST REMOVAL    . RT BUNIONECTOMT    . TONSILLECTOMY AND ADENOIDECTOMY    . TUBAL LIGATION    . UTERINE FIBROID SURGERY       OB History    Gravida Para Term Preterm AB Living   3 2 2  0 1 2   SAB TAB Ectopic Multiple Live Births   1 0 0   2       Home Medications    Prior to Admission medications   Medication Sig Start Date End Date Taking? Authorizing Provider  Cholecalciferol (GNP VITAMIN D) 1000 units tablet Take 2 tablets (2,000 Units total) by mouth daily. 10/11/15  Yes Aleksei Plotnikov V, MD  cyanocobalamin (,VITAMIN B-12,) 1000 MCG/ML injection Inject 1 mL (1,000 mcg total) into the skin every 14 (fourteen) days. 10/22/14  Yes Aleksei Plotnikov V, MD  ergocalciferol (VITAMIN D2) 50000 units capsule Take 1 capsule (50,000 Units total) by mouth once a week. 02/18/16  Yes Lyndal Pulley, DO  hydrocortisone 2.5 % cream Apply topically 2 (two) times daily. 12/13/15  Yes Aleksei Plotnikov V, MD  amitriptyline (ELAVIL) 10 MG tablet TAKE 1 TABLET (10 MG TOTAL) BY MOUTH AT BEDTIME. 04/20/16   Historical Provider, MD  CASCARA SAGRADA PO Take by mouth.    Historical Provider, MD  fluocinonide (LIDEX) 0.05 % external solution  02/06/16   Historical Provider, MD  Phosphatidylserine-DHA-EPA (VAYACOG) 100-19.5-6.5 MG CAPS Take 1 capsule by mouth daily. 09/11/15   Aleksei Plotnikov V, MD  SYRINGE-NEEDLE, DISP, 3 ML (BD ECLIPSE SYRINGE) 25G X 1" 3 ML MISC Use IM 10/10/14   Aleksei Plotnikov V, MD  Turmeric 500 MG TABS Take by mouth 2 (two) times daily.    Historical Provider, MD    Family History Family History  Problem Relation Age of Onset  . Lung cancer Mother   . Thyroid cancer Mother   . Cancer Mother     INTESTINAL  . Healthy Father   . Diabetes      uncles/aunts  . Von Willebrand disease Daughter   . Asthma Neg Hx     Social History Social History  Substance Use Topics  . Smoking status: Former Smoker    Quit date: 10/31/1988  . Smokeless tobacco: Never Used  . Alcohol use 0.6 - 1.2 oz/week    1 - 2 Standard drinks or equivalent per week     Comment: occasional      Allergies   Indocin  [indomethacin]; Morphine sulfate; Sulfacetamide sodium; Caffeine; Morphine and related; Sulfa antibiotics; Azithromycin; and Erythromycin stearate   Review of Systems Review of Systems  All other systems reviewed and are negative.    Physical Exam Updated Vital Signs BP 106/64   Pulse 66   Temp 98.6 F (37 C) (Oral)   Resp 20   SpO2 95%   Physical Exam  Constitutional: She appears well-developed and well-nourished. No distress.  HENT:  Head: Normocephalic and atraumatic.  Neck: Neck supple.  Cardiovascular: Normal rate and regular rhythm.  Pulmonary/Chest: Effort normal and breath sounds normal. No respiratory distress. She has no wheezes. She has no rales.  Abdominal: Soft. She exhibits no distension. There is no tenderness. There is no rebound and no guarding.  Musculoskeletal:  Pt gesturing with left arm as she speaks   Neurological: She is alert.  CN II-XII intact (chronic facial numbness that is unilateral, unchanged), EOMs intact, no pronator drift, grip strengths equal bilaterally; strength 5/5 in all extremities, sensation intact in all extremities; finger to nose, heel to shin, rapid alternating movements normal.    Skin: She is not diaphoretic.  Nursing note and vitals reviewed.    ED Treatments / Results  Labs (all labs ordered are listed, but only abnormal results are displayed) Labs Reviewed  BASIC METABOLIC PANEL - Abnormal; Notable for the following:       Result Value   Calcium 8.8 (*)    All other components within normal limits  CBC WITH DIFFERENTIAL/PLATELET  TROPONIN I  TROPONIN I    EKG  EKG Interpretation  Date/Time:  Monday July 20 2016 11:09:41 EDT Ventricular Rate:  63 PR Interval:    QRS Duration: 88 QT Interval:  404 QTC Calculation: 414 R Axis:   9 Text Interpretation:  Sinus rhythm RSR' in V1 or V2, probably normal variant No significant change since last tracing Confirmed by Winfred Leeds  MD, SAM [phone]) on 07/20/2016 11:22:59  AM       Radiology Dg Chest 2 View  Result Date: 07/20/2016 CLINICAL DATA:  Shortness of Breath EXAM: CHEST  2 VIEW COMPARISON:  Chest radiograph June 29, 2015; chest CT June 29, 2015 FINDINGS: There is no edema or consolidation. The heart size and pulmonary vascularity are normal. No adenopathy. No bone lesions. IMPRESSION: No edema or consolidation. Electronically Signed   By: Lowella Grip III M.D.   On: 07/20/2016 12:37   Ct Head Wo Contrast  Result Date: 07/20/2016 CLINICAL DATA:  Left upper extremity weakness and paresthesia EXAM: CT HEAD WITHOUT CONTRAST TECHNIQUE: Contiguous axial images were obtained from the base of the skull through the vertex without intravenous contrast. COMPARISON:  June 29, 2015 FINDINGS: Brain: There has been interval resolution of epidural hematoma on the right at the frontoparietal junction compared to the previous study. The ventricles are normal in size and configuration. There is borderline frontal atrophy bilaterally. Currently there is no intracranial mass, hemorrhage, extra-axial fluid collection, or midline shift. Gray-white compartments appear unremarkable. No acute infarct is demonstrable. Vascular: There is no hyperdense vessel. There is no appreciable vascular calcification. Skull: Bony calvarium appears intact. Sinuses/Orbits: Visualized paranasal sinuses are clear. Visualized orbits appear symmetric bilaterally. Other: Visualized mastoid air cells are clear. IMPRESSION: Slight frontal atrophy bilaterally. Ventricles normal in size and configuration. No intracranial mass, hemorrhage, or extra-axial fluid collection. Gray-white compartments normal. Note that the previous right frontal-parietal epidural hematoma has resolved. Electronically Signed   By: Lowella Grip III M.D.   On: 07/20/2016 12:40    Procedures Procedures (including critical care time)  Medications Ordered in ED Medications - No data to display   Initial Impression /  Assessment and Plan / ED Course  I have reviewed the triage vital signs and the nursing notes.  Pertinent labs & imaging results that were available during my care of the patient were reviewed by me and considered in my medical decision making (see chart for details).     Afebrile, nontoxic patient with episode of left arm paresthesia while walking.  Also had momentary  SOB x 2 while walking.  No chest pain.  Did develop scapular pain while sitting in ED.  Doubt ACS, PE, CVA.  Pt does have fibromyalgia and chronic sequela from fall last year.  Workup reassuring. D/C home with close PCP follow up, return precautions.  Discussed result, findings, treatment, and follow up  with patient.  Pt given return precautions.  Pt verbalizes understanding and agrees with plan.       Final Clinical Impressions(s) / ED Diagnoses   Final diagnoses:  Paresthesia  Shortness of breath    New Prescriptions Discharge Medication List as of 07/20/2016  4:44 PM       Clayton Bibles, PA-C 07/21/16 1718    Orlie Dakin, MD 07/22/16 1705

## 2016-07-20 NOTE — ED Notes (Signed)
Pt verbalized understanding of discharge instructions and denies any further questions at this time.   

## 2016-07-20 NOTE — ED Triage Notes (Signed)
States she was evaluated for blood clot a week ago. An hour ago she started having numbness in her left arm. She is ambulatory, very talkative, no distress at triage. Bed placement and EKG after triage VS.

## 2016-07-20 NOTE — ED Notes (Signed)
IV placed for labs and due to patient reporting shortness of breath, with history of lung nodule - IV placed in left [facility] with ease - however pt requesting that I remove it due to patient stating "If God wanted me to have a port, I would have entered this world with a port." IV removed.

## 2016-07-22 ENCOUNTER — Other Ambulatory Visit: Payer: Self-pay | Admitting: *Deleted

## 2016-07-22 ENCOUNTER — Ambulatory Visit: Payer: BC Managed Care – PPO | Admitting: Physical Therapy

## 2016-07-22 ENCOUNTER — Encounter: Payer: Self-pay | Admitting: Physical Therapy

## 2016-07-22 DIAGNOSIS — M62838 Other muscle spasm: Secondary | ICD-10-CM | POA: Diagnosis not present

## 2016-07-22 DIAGNOSIS — M25531 Pain in right wrist: Secondary | ICD-10-CM

## 2016-07-22 DIAGNOSIS — M546 Pain in thoracic spine: Secondary | ICD-10-CM

## 2016-07-22 DIAGNOSIS — M542 Cervicalgia: Secondary | ICD-10-CM

## 2016-07-22 DIAGNOSIS — M6281 Muscle weakness (generalized): Secondary | ICD-10-CM

## 2016-07-22 NOTE — Patient Instructions (Signed)
   Practice breathing into the belly and feeling the ribcage expand not lifting the chest As you breathe in top of the head is being pulled towards the ceiling and lengthen the spine As you breathe out, keep spine straight and tall letting everything relax around it

## 2016-07-22 NOTE — Therapy (Signed)
[facility] [address] [place], [place], [address] Phone: [phone]   Fax:  [phone]  Physical Therapy Treatment  Patient Details  Name: Regina [name] MRN: [id number] Date of Birth: Aug 03, 1965 Referring Provider: Dr. Lew Dawes  Encounter Date: 07/22/2016      PT End of Session - 07/22/16 1537    Visit Number [id number]   Date for PT Re-Evaluation 08/05/16   PT Start Time 1529   PT Stop Time 1612   PT Time Calculation (min) 43 min   Activity Tolerance Patient tolerated treatment well   Behavior During Therapy [facility] for tasks assessed/performed      Past Medical History:  Diagnosis Date  . Allergic urticaria   . ARTHRALGIA   . Arthritis   . Atrial fibrillation ([place])   . B12 DEFICIENCY   . CFS (chronic fatigue syndrome)   . Endometriosis   . Epidural hemorrhage with loss of consciousness ([place]) 06/29/15  . Fibroid   . Fibromyalgia   . Fracture of cervical vertebra, C6 ([place]) 06/29/15  . Gastritis   . GERD   . Heart murmur   . History of endometriosis   . IBS (irritable bowel syndrome)   . Internal hemorrhoids   . MVP (mitral valve prolapse)   . Osteoarthritis   . Rosacea   . Thoracic compression fracture ([place]) 06/29/15   T2-T6    Past Surgical History:  Procedure Laterality Date  . ABDOMINAL HYSTERECTOMY     partial  . ABDOMINAL HYSTERECTOMY    . ANAL FISSURE REPAIR     Dr Rise [name] 2009  . APPENDECTOMY    . COLPOSCOPY    . HEMORRHOID SURGERY     Dr. Rise [name] 2009  . OVARIAN CYST REMOVAL    . RT BUNIONECTOMT    . TONSILLECTOMY AND ADENOIDECTOMY    . TUBAL LIGATION    . UTERINE FIBROID SURGERY      There were no vitals filed for this visit.      Subjective Assessment - 07/22/16 1528    Subjective I thought I was having a heart attack on Monday but I think it was [facility] I got short of breath and my arm felt heavy, but any heart issues were ruled out.  Denies  pain but feels discomfort on left side from shoulder down back..  I was very uncomfortable to a couple of hours after the exercises last night.   Pertinent History fibromyalgia   Patient Stated Goals reduce pain, turn neck instead of body   Currently in Pain? No/denies                         [facility] Adult PT Treatment/Exercise - 07/22/16 0001      Neuro Re-ed    Neuro Re-ed Details  breathing exercises and postural alignement cues throughout the exercise listed below     Neck Exercises: Machines for Strengthening   UBE (Upper Arm Bike) Level 4 x 8 minutes (4/4)   therapist present     Neck Exercises: Seated   Other Seated Exercise upper trap stretch, scalene stretch, pec stretch, nerve glides radial and median   Other Seated Exercise seated with side bend and rotation for thoracic mobility     Neck Exercises: Supine   Shoulder ABduction Both;20 reps  red  band   Upper Extremity D2 Extension;20 reps;Theraband   Theraband Level (UE D2) Level 2 (Red)  Other Supine Exercise bilateral external rotation - red band - 20x     Neck Exercises: Prone   Other Prone Exercise W's on green ball - 10x                PT Education - 07/22/16 1724    Education provided Yes   Education Details breathing with ribcage excursion   Person(s) Educated Patient   Methods Explanation;Demonstration;Tactile cues;Verbal cues;Handout   Comprehension Verbalized understanding;Returned demonstration          PT Short Term Goals - 06/04/16 1711      PT SHORT TERM GOAL #1   Title independent with initial HEP   Status Achieved     PT SHORT TERM GOAL #2   Title headaches decreased in frequency by 25%   Status Achieved     PT SHORT TERM GOAL #3   Title ability to turn her head with pain decreased >/= 25% due to increase tissue mobility   Status Achieved           PT Long Term Goals - 07/13/16 1541      PT LONG TERM GOAL #1   Title independent with HEP and understand  how to progress herself   Time 6   Period Weeks   Status On-going     PT LONG TERM GOAL #2   Title ability to look upward into a cabinet with >/= 50% greater ease due to increase mobility and decreased pain.   Status Achieved     PT LONG TERM GOAL #3   Title turn neck with driving without increased pain   Period Weeks   Status On-going     PT LONG TERM GOAL #4   Title incresaed strength and decrease pain  to vacuum and mop floors in her house without needing to take a rest   Time 6   Period Weeks   Status On-going     PT LONG TERM GOAL #6   Title wear a back pack for work without incresaed pain   Time 6   Period Weeks   Status On-going               Plan - 07/22/16 1538    Clinical Impression Statement Patient was having more muscle spasm today and reports having a lot of tension around left shoulder blade that was worse at the end of therapy session.  She did not complain throughout the session and didn't feel there was one thing that aggravated her, but just feeling much tighter in general.  Treatment was focused more on stretches and gentle strengthening and ROM of thoracic spine.  Pt continues to need skilled PT for improved muscle functior correct spinal alignment during functional activitires.   Rehab Potential Good   PT Treatment/Interventions Cryotherapy;Electrical Stimulation;Ultrasound;Traction;Moist Heat;Therapeutic activities;Therapeutic exercise;Neuromuscular re-education;Patient/family education;Passive range of motion;Manual techniques;Dry needling;Energy conservation;Taping   PT Next Visit Plan Continue strength for thoracic and UEs, cervcial A/ROM, manual and modalities.   PT Home Exercise Plan cervical stabilization exercises   Consulted and Agree with Plan of Care Patient      Patient will benefit from skilled therapeutic intervention in order to improve the following deficits and impairments:  Decreased range of motion, Increased fascial restricitons,  Decreased endurance, Increased muscle spasms, Pain, Hypomobility, Impaired flexibility, Decreased mobility, Decreased strength  Visit Diagnosis: Other muscle spasm  Muscle weakness (generalized)  Pain in thoracic spine  Cervicalgia     Problem List Patient Active Problem List   Diagnosis  Date Noted  . Posterior right knee pain 07/16/2016  . Knee pain, right 05/21/2016  . Hamstring strain, sequela 05/21/2016  . Wart viral 05/12/2016  . Contusion of knee 05/05/2016  . Fall involving sidewalk curb 05/05/2016  . Aphasia 03/13/2016  . Shoulder pain, bilateral 12/11/2015  . Fibromyalgia 12/11/2015  . Post concussion syndrome 11/01/2015  . Clavicle fracture 10/30/2015  . Hair loss 10/11/2015  . Pain in joint, shoulder region 09/11/2015  . Right wrist pain 08/07/2015  . Splinter in skin 07/30/2015  . Fall from ladder 07/30/2015  . Skull fracture with concussion ([place]) 07/30/2015  . Fracture of cervical vertebra, C6 ([place]) 07/30/2015  . Thoracic vertebral fracture ([place]) 07/30/2015  . Abdominal pain, epigastric 05/07/2015  . Scaly patch rash 02/26/2015  . Mitral valve regurgitation 09/27/2014  . PAT (paroxysmal atrial tachycardia) ([place]) 09/06/2014  . Tachycardia 07/31/2014  . Sprain of ankle 06/29/2014  . Earache, left 05/15/2013  . Dizziness 05/15/2013  . Asthmatic bronchitis 05/08/2013  . Benign paroxysmal positional vertigo 05/08/2013  . Chest wall pain 01/26/2013  . LUQ abdominal pain 01/20/2013  . Insomnia 01/10/2013  . Depression (emotion) 01/10/2013  . URI, acute 08/25/2012  . Snoring 08/25/2012  . IBS (irritable bowel syndrome) 02/08/2012  . Multiple pulmonary nodules 02/08/2012  . Chest heaviness 11/16/2010  . GERD 05/24/2010  . SINUSITIS, MAXILLARY, CHRONIC 05/20/2010  . Myalgia and myositis 11/20/2009  . Fatigue 11/20/2009  . ARTHRALGIA 06/26/2009  . Cervicogenic headache 06/26/2009  . TOBACCO USE, QUIT 06/26/2009  . LOW BACK PAIN 02/20/2009  . Rosacea  12/27/2008  . PRURITUS 12/27/2008  . Allergic urticaria 12/27/2008  . B12 deficiency 01/02/2008    Zannie Cove, PT 07/22/2016, 5:36 PM  [facility] [address] [place], [place], [address] Phone: [phone]   Fax:  [phone]  Name: TEFFANY BLASZCZYK MRN: [id number] Date of Birth: [birth year]

## 2016-07-24 ENCOUNTER — Encounter: Payer: Self-pay | Admitting: Internal Medicine

## 2016-07-24 ENCOUNTER — Ambulatory Visit (INDEPENDENT_AMBULATORY_CARE_PROVIDER_SITE_OTHER): Payer: BC Managed Care – PPO | Admitting: Internal Medicine

## 2016-07-24 DIAGNOSIS — E538 Deficiency of other specified B group vitamins: Secondary | ICD-10-CM | POA: Diagnosis not present

## 2016-07-24 DIAGNOSIS — J4520 Mild intermittent asthma, uncomplicated: Secondary | ICD-10-CM | POA: Diagnosis not present

## 2016-07-24 DIAGNOSIS — M25561 Pain in right knee: Secondary | ICD-10-CM

## 2016-07-24 DIAGNOSIS — R0789 Other chest pain: Secondary | ICD-10-CM | POA: Diagnosis not present

## 2016-07-24 MED ORDER — ALBUTEROL SULFATE HFA 108 (90 BASE) MCG/ACT IN AERS: 2.0000 | INHALATION_SPRAY | Freq: Four times a day (QID) | RESPIRATORY_TRACT | 6 refills | Status: DC | PRN

## 2016-07-24 NOTE — Assessment & Plan Note (Signed)
LIMITED MSK ULTRAS2OUND of: Right knee 2/18 Dr Paulla Fore INFO: Images were obtained and interpreted by myself, Teresa Coombs, DO  Images have been saved and stored to PACS system. Images obtained on: GE S7 Ultrasound machine  FINDINGS: Patella & Patellar Tendon: Small amount of hypoechoic change both the origin and insertion of the patellar tendon without neovascularity.  She has no pain with sonopalpation. Quad & Quad Tendon: Normal Suprapatellar Pouch: Normal, no effusion Medial Joint Line: Bulging and fraying of the meniscus Lateral Joint Line: Bulging and swelling of the lateral meniscus  Trochlea: Overall only very small area of focal cartilage loss along the medial femoral condyle. Posterior knee: Easily compressible popliteal vein.  Very small Baker's cyst.  Posterior musculature does reveal some changes in the distal third of the lateral hamstrings consistent with a prior injury.  No increased neovascularity.   IMPRESSION: The above findings are consistent with prior hamstring injury with underlying minimal but likely significant degenerative change.

## 2016-07-24 NOTE — Progress Notes (Addendum)
Subjective:  Patient ID: Regina [name], female    DOB: April 14, 1965  Age: 51 y.o. MRN: [id number]  CC: No chief complaint on file.   HPI TEJAH BREKKE presents for post-concussion HAs, cognitive issues,  B12 def f/u S/p ER visit for SOB. CXR was OK  LLE Duplex - no DVT 07/16/16  Outpatient Medications Prior to Visit  Medication Sig Dispense Refill  . CASCARA SAGRADA PO Take by mouth.    . Cholecalciferol (GNP VITAMIN D) 1000 units tablet Take 2 tablets (2,000 Units total) by mouth daily. 100 tablet 3  . cyanocobalamin (,VITAMIN B-12,) 1000 MCG/ML injection Inject 1 mL (1,000 mcg total) into the skin every 14 (fourteen) days. 10 mL 11  . ergocalciferol (VITAMIN D2) 50000 units capsule Take 1 capsule (50,000 Units total) by mouth once a week. 6 capsule 0  . hydrocortisone 2.5 % cream Apply topically 2 (two) times daily. 40 g 1  . Phosphatidylserine-DHA-EPA (VAYACOG) 100-19.5-6.5 MG CAPS Take 1 capsule by mouth daily. 30 capsule 11  . SYRINGE-NEEDLE, DISP, 3 ML (BD ECLIPSE SYRINGE) 25G X 1" 3 ML MISC Use IM 50 each 11  . Turmeric 500 MG TABS Take by mouth 2 (two) times daily.    Marland Kitchen amitriptyline (ELAVIL) 10 MG tablet TAKE 1 TABLET (10 MG TOTAL) BY MOUTH AT BEDTIME.  5  . fluocinonide (LIDEX) 0.05 % external solution      No facility-administered medications prior to visit.     ROS Review of Systems  Constitutional: Negative for activity change, appetite change, chills, fatigue and unexpected weight change.  HENT: Negative for congestion, mouth sores and sinus pressure.   Eyes: Negative for visual disturbance.  Respiratory: Positive for shortness of breath. Negative for cough and chest tightness.   Gastrointestinal: Negative for abdominal pain and nausea.  Genitourinary: Negative for difficulty urinating, frequency and vaginal pain.  Musculoskeletal: Positive for back pain, neck pain and neck stiffness. Negative for gait problem.  Skin: Negative for pallor and rash.  Neurological:  Negative for dizziness, tremors, weakness, numbness and headaches.  Psychiatric/Behavioral: Positive for decreased concentration. Negative for confusion and sleep disturbance.    Objective:  BP 108/76 (BP Location: Left Arm, Patient Position: Sitting, Cuff Size: Normal)   Pulse 76   Temp 98.6 F (37 C) (Oral)   Ht 5\' 4"  (1.626 m)   Wt 168 lb 1.3 oz (76.2 kg)   SpO2 99%   BMI 28.85 kg/m   BP Readings from Last 3 Encounters:  07/24/16 108/76  07/20/16 106/64  07/16/16 118/60    Wt Readings from Last 3 Encounters:  07/24/16 168 lb 1.3 oz (76.2 kg)  07/16/16 169 lb (76.7 kg)  07/10/16 168 lb (76.2 kg)    Physical Exam  Constitutional: She appears well-developed. No distress.  HENT:  Head: Normocephalic.  Right Ear: External ear normal.  Left Ear: External ear normal.  Nose: Nose normal.  Mouth/Throat: Oropharynx is clear and moist.  Eyes: Conjunctivae are normal. Pupils are equal, round, and reactive to light. Right eye exhibits no discharge. Left eye exhibits no discharge.  Neck: Normal range of motion. Neck supple. No JVD present. No tracheal deviation present. No thyromegaly present.  Cardiovascular: Normal rate, regular rhythm and normal heart sounds.   Pulmonary/Chest: No stridor. No respiratory distress. She has no wheezes.  Abdominal: Soft. Bowel sounds are normal. She exhibits no distension and no mass. There is no tenderness. There is no rebound and no guarding.  Musculoskeletal: She exhibits tenderness. She  exhibits no edema.  Lymphadenopathy:    She has no cervical adenopathy.  Neurological: She displays normal reflexes. No cranial nerve deficit. She exhibits normal muscle tone. Coordination normal.  Skin: No rash noted. No erythema.  Psychiatric: She has a normal mood and affect. Her behavior is normal. Judgment and thought content normal.  neck- tender R wrist tender R SI tender   Lab Results  Component Value Date   WBC 6.8 07/20/2016   HGB 12.6  07/20/2016   HCT 37.2 07/20/2016   PLT 305 07/20/2016   GLUCOSE 92 07/20/2016   CHOL 201 (H) 02/26/2015   TRIG 65.0 02/26/2015   HDL 62.10 02/26/2015   LDLCALC 126 (H) 02/26/2015   ALT 18 09/11/2015   AST 19 09/11/2015   NA 136 07/20/2016   K 3.6 07/20/2016   CL 104 07/20/2016   CREATININE 0.68 07/20/2016   BUN 11 07/20/2016   CO2 27 07/20/2016   TSH 1.19 09/11/2015   INR 1.16 06/29/2015    Dg Chest 2 View  Result Date: 07/20/2016 CLINICAL DATA:  Shortness of Breath EXAM: CHEST  2 VIEW COMPARISON:  Chest radiograph June 29, 2015; chest CT June 29, 2015 FINDINGS: There is no edema or consolidation. The heart size and pulmonary vascularity are normal. No adenopathy. No bone lesions. IMPRESSION: No edema or consolidation. Electronically Signed   By: Lowella Grip III M.D.   On: 07/20/2016 12:37   Ct Head Wo Contrast  Result Date: 07/20/2016 CLINICAL DATA:  Left upper extremity weakness and paresthesia EXAM: CT HEAD WITHOUT CONTRAST TECHNIQUE: Contiguous axial images were obtained from the base of the skull through the vertex without intravenous contrast. COMPARISON:  June 29, 2015 FINDINGS: Brain: There has been interval resolution of epidural hematoma on the right at the frontoparietal junction compared to the previous study. The ventricles are normal in size and configuration. There is borderline frontal atrophy bilaterally. Currently there is no intracranial mass, hemorrhage, extra-axial fluid collection, or midline shift. Gray-white compartments appear unremarkable. No acute infarct is demonstrable. Vascular: There is no hyperdense vessel. There is no appreciable vascular calcification. Skull: Bony calvarium appears intact. Sinuses/Orbits: Visualized paranasal sinuses are clear. Visualized orbits appear symmetric bilaterally. Other: Visualized mastoid air cells are clear. IMPRESSION: Slight frontal atrophy bilaterally. Ventricles normal in size and configuration. No intracranial mass,  hemorrhage, or extra-axial fluid collection. Gray-white compartments normal. Note that the previous right frontal-parietal epidural hematoma has resolved. Electronically Signed   By: Lowella Grip III M.D.   On: 07/20/2016 12:40    Assessment & Plan:   There are no diagnoses linked to this encounter. I have discontinued Ms. Gorton's fluocinonide and amitriptyline. I am also having her maintain her SYRINGE-NEEDLE (DISP) 3 ML, cyanocobalamin, CASCARA SAGRADA PO, Phosphatidylserine-DHA-EPA, Cholecalciferol, Turmeric, hydrocortisone, and ergocalciferol.  No orders of the defined types were placed in this encounter.    Follow-up: No Follow-up on file.  Walker Kehr, MD

## 2016-07-24 NOTE — Assessment & Plan Note (Signed)
On B12 

## 2016-07-24 NOTE — Assessment & Plan Note (Signed)
exercise induced asthma Use Proair prior to exercise

## 2016-07-24 NOTE — Progress Notes (Signed)
Pre visit review using our clinic review tool, if applicable. No additional management support is needed unless otherwise documented below in the visit note. 

## 2016-07-28 ENCOUNTER — Encounter: Payer: BC Managed Care – PPO | Admitting: Neurology

## 2016-07-29 NOTE — Progress Notes (Signed)
[facility] PREP Weekly Session   Patient Details  Name: SAFIRE GORDIN MRN: [id number] Date of Birth: 09-Dec-1965 Age: 51 y.o. PCP: Walker Kehr, MD  Vitals:   07/29/16 1101  Weight: 167 lb (75.8 kg)        [facility] Weekly seesion - 07/29/16 1100      Weekly Session   Topic Discussed Other  fat calories   Minutes exercised this week 360 minutes  180cardio/60strength/177flexibility   Classes attended to date 2      Things you are grateful for;"husband, children, grandbabies, life" Nutrition celebrations:"starting new diet next week" Barriers:"food" Vanita Ingles 07/29/2016, 11:03 AM

## 2016-08-05 ENCOUNTER — Encounter: Payer: Self-pay | Admitting: Family Medicine

## 2016-08-05 ENCOUNTER — Ambulatory Visit (INDEPENDENT_AMBULATORY_CARE_PROVIDER_SITE_OTHER): Payer: BC Managed Care – PPO | Admitting: Family Medicine

## 2016-08-05 VITALS — BP 102/68 | HR 78 | Temp 98.5°F | Wt 169.0 lb

## 2016-08-05 DIAGNOSIS — J0100 Acute maxillary sinusitis, unspecified: Secondary | ICD-10-CM

## 2016-08-05 NOTE — Patient Instructions (Signed)
Continue Mucinex D as you are doing Can add saline nasal spray, warm compresses and ibuprofen for pain If you are bothered by watery eyes, increased nasal drainage- you can take an over the counter antihistamine like Allegra, Claritin or Zyrtec (generic is fine) If you are not feeling better in 5-7 days please send me a mychart message   Sinusitis, Adult Sinusitis is soreness and inflammation of your sinuses. Sinuses are hollow spaces in the bones around your face. Your sinuses are located:  Around your eyes.  In the middle of your forehead.  Behind your nose.  In your cheekbones. Your sinuses and nasal passages are lined with a stringy fluid (mucus). Mucus normally drains out of your sinuses. When your nasal tissues become inflamed or swollen, the mucus can become trapped or blocked so air cannot flow through your sinuses. This allows bacteria, viruses, and funguses to grow, which leads to infection. Sinusitis can develop quickly and last for 7?10 days (acute) or for more than 12 weeks (chronic). Sinusitis often develops after a cold. What are the causes? This condition is caused by anything that creates swelling in the sinuses or stops mucus from draining, including:  Allergies.  Asthma.  Bacterial or viral infection.  Abnormally shaped bones between the nasal passages.  Nasal growths that contain mucus (nasal polyps).  Narrow sinus openings.  Pollutants, such as chemicals or irritants in the air.  A foreign object stuck in the nose.  A fungal infection. This is rare. What increases the risk? The following factors may make you more likely to develop this condition:  Having allergies or asthma.  Having had a recent cold or respiratory tract infection.  Having structural deformities or blockages in your nose or sinuses.  Having a weak immune system.  Doing a lot of swimming or diving.  Overusing nasal sprays.  Smoking. What are the signs or symptoms? The main  symptoms of this condition are pain and a feeling of pressure around the affected sinuses. Other symptoms include:  Upper toothache.  Earache.  Headache.  Bad breath.  Decreased sense of smell and taste.  A cough that may get worse at night.  Fatigue.  Fever.  Thick drainage from your nose. The drainage is often green and it may contain pus (purulent).  Stuffy nose or congestion.  Postnasal drip. This is when extra mucus collects in the throat or back of the nose.  Swelling and warmth over the affected sinuses.  Sore throat.  Sensitivity to light. How is this diagnosed? This condition is diagnosed based on symptoms, a medical history, and a physical exam. To find out if your condition is acute or chronic, your health care provider may:  Look in your nose for signs of nasal polyps.  Tap over the affected sinus to check for signs of infection.  View the inside of your sinuses using an imaging device that has a light attached (endoscope). If your health care provider suspects that you have chronic sinusitis, you may also:  Be tested for allergies.  Have a sample of mucus taken from your nose (nasal culture) and checked for bacteria.  Have a mucus sample examined to see if your sinusitis is related to an allergy. If your sinusitis does not respond to treatment and it lasts longer than 8 weeks, you may have an MRI or CT scan to check your sinuses. These scans also help to determine how severe your infection is. In rare cases, a bone biopsy may be done to  rule out more serious types of fungal sinus disease. How is this treated? Treatment for sinusitis depends on the cause and whether your condition is chronic or acute. If a virus is causing your sinusitis, your symptoms will go away on their own within 10 days. You may be given medicines to relieve your symptoms, including:  Topical nasal decongestants. They shrink swollen nasal passages and let mucus drain from your  sinuses.  Antihistamines. These drugs block inflammation that is triggered by allergies. This can help to ease swelling in your nose and sinuses.  Topical nasal corticosteroids. These are nasal sprays that ease inflammation and swelling in your nose and sinuses.  Nasal saline washes. These rinses can help to get rid of thick mucus in your nose. If your condition is caused by bacteria, you will be given an antibiotic medicine. If your condition is caused by a fungus, you will be given an antifungal medicine. Surgery may be needed to correct underlying conditions, such as narrow nasal passages. Surgery may also be needed to remove polyps. Follow these instructions at home: Medicines   Take, use, or apply over-the-counter and prescription medicines only as told by your health care provider. These may include nasal sprays.  If you were prescribed an antibiotic medicine, take it as told by your health care provider. Do not stop taking the antibiotic even if you start to feel better. Hydrate and Humidify   Drink enough water to keep your urine clear or pale yellow. Staying hydrated will help to thin your mucus.  Use a cool mist humidifier to keep the humidity level in your home above 50%.  Inhale steam for 10-15 minutes, 3-4 times a day or as told by your health care provider. You can do this in the bathroom while a hot shower is running.  Limit your exposure to cool or dry air. Rest   Rest as much as possible.  Sleep with your head raised (elevated).  Make sure to get enough sleep each night. General instructions   Apply a warm, moist washcloth to your face 3-4 times a day or as told by your health care provider. This will help with discomfort.  Wash your hands often with soap and water to reduce your exposure to viruses and other germs. If soap and water are not available, use hand sanitizer.  Do not smoke. Avoid being around people who are smoking (secondhand smoke).  Keep all  follow-up visits as told by your health care provider. This is important. Contact a health care provider if:  You have a fever.  Your symptoms get worse.  Your symptoms do not improve within 10 days. Get help right away if:  You have a severe headache.  You have persistent vomiting.  You have pain or swelling around your face or eyes.  You have vision problems.  You develop confusion.  Your neck is stiff.  You have trouble breathing. This information is not intended to replace advice given to you by your health care provider. Make sure you discuss any questions you have with your health care provider. Document Released: 03/16/2005 Document Revised: 11/10/2015 Document Reviewed: 01/09/2015 Elsevier Interactive Patient Education  2017 [employer].

## 2016-08-05 NOTE — Progress Notes (Signed)
Subjective:    Patient ID: Regina [name], female    DOB: Jun 01, 1965, 51 y.o.   MRN: [id number]  HPI This is a 51 yo female who presents today with 4 days of headache that moves around. Has started to have pressure under left eye. Had eye exam yesterday which exacerbated the headache. Eyes watery, headache worse today. Some post nasal drainage, voice change, no sore throat, ear ache left ear last night. Chills last night. No cough. Has taken Mucinex D x 2 days with some relief, but still with pressure. Also took acetaminophen one tablet with mucinex D. Does not think she usually has sinus problems in the spring. Has taken antihistamines in the past. No jaw pain or difficulty swallowing. She does not like to take medication.   Past Medical History:  Diagnosis Date  . Allergic urticaria   . ARTHRALGIA   . Arthritis   . Atrial fibrillation ([place])   . B12 DEFICIENCY   . CFS (chronic fatigue syndrome)   . Endometriosis   . Epidural hemorrhage with loss of consciousness ([place]) 06/29/15  . Fibroid   . Fibromyalgia   . Fracture of cervical vertebra, C6 ([place]) 06/29/15  . Gastritis   . GERD   . Heart murmur   . History of endometriosis   . IBS (irritable bowel syndrome)   . Internal hemorrhoids   . MVP (mitral valve prolapse)   . Osteoarthritis   . Rosacea   . Thoracic compression fracture ([place]) 06/29/15   T2-T6   Past Surgical History:  Procedure Laterality Date  . ABDOMINAL HYSTERECTOMY     partial  . ABDOMINAL HYSTERECTOMY    . ANAL FISSURE REPAIR     Dr Rise [name] 2009  . APPENDECTOMY    . COLPOSCOPY    . HEMORRHOID SURGERY     Dr. Rise [name] 2009  . OVARIAN CYST REMOVAL    . RT BUNIONECTOMT    . TONSILLECTOMY AND ADENOIDECTOMY    . TUBAL LIGATION    . UTERINE FIBROID SURGERY     Family History  Problem Relation Age of Onset  . Lung cancer Mother   . Thyroid cancer Mother   . Cancer Mother     INTESTINAL  . Healthy Father   . Diabetes      uncles/aunts  . Von Willebrand  disease Daughter   . Asthma Neg Hx    Social History  Substance Use Topics  . Smoking status: Former Smoker    Quit date: 10/31/1988  . Smokeless tobacco: Never Used  . Alcohol use 0.6 - 1.2 oz/week    1 - 2 Standard drinks or equivalent per week     Comment: occasional      Review of Systems     Objective:   Physical Exam  Constitutional: She is oriented to person, place, and time. She appears well-developed and well-nourished. No distress.  HENT:  Head: Normocephalic and atraumatic.  Right Ear: Tympanic membrane, external ear and ear canal normal.  Left Ear: Tympanic membrane, external ear and ear canal normal.  Nose: Septal deviation present. Right sinus exhibits maxillary sinus tenderness and frontal sinus tenderness. Left sinus exhibits maxillary sinus tenderness and frontal sinus tenderness.  Mouth/Throat: Uvula is midline, oropharynx is clear and moist and mucous membranes are normal.  Mild tenderness over and under eyes.   Eyes: Conjunctivae are normal.  Neck: Normal range of motion. Neck supple.  Cardiovascular: Normal rate, regular rhythm and normal heart sounds.   Pulmonary/Chest: Effort normal  and breath sounds normal.  Neurological: She is alert and oriented to person, place, and time.  Skin: Skin is warm and dry. She is not diaphoretic.  Psychiatric: She has a normal mood and affect. Her behavior is normal. Judgment and thought content normal.  Vitals reviewed.     BP 102/68 (BP Location: Left Arm, Patient Position: Sitting, Cuff Size: Normal)   Pulse 78   Temp 98.5 F (36.9 C) (Oral)   Wt 169 lb (76.7 kg)   SpO2 97%   BMI 29.01 kg/m  Wt Readings from Last 3 Encounters:  08/05/16 169 lb (76.7 kg)  07/29/16 167 lb (75.8 kg)  07/24/16 168 lb 1.3 oz (76.2 kg)       Assessment & Plan:  1. Acute non-recurrent maxillary sinusitis - likely viral or related to allergies, not likely bacterial given lack of nasal drainage, fever - Provided written and verbal  information regarding diagnosis and treatment. - continue Mucinex D, suggested she add Afrin for 3 days- she states she does not like the ingredients, recommended saline nasal spray, ibuprofen, otc long acting antihistamine and warm compresses as needed - follow up if no improvement in 5-7 days   Clarene Reamer, FNP-BC  [facility] [place], [place]  08/05/2016 2:04 PM

## 2016-08-07 NOTE — Progress Notes (Signed)
[facility] PREP Weekly Session   Patient Details  Name: Regina [name] MRN: [id number] Date of Birth: [birth year] Age: 51 y.o. PCP: Cassandria Anger, MD  Vitals:   08/07/16 8177  Weight: 169 lb (76.7 kg)        [facility] Weekly seesion - 08/07/16 0900      Weekly Session   Topic Discussed Healthy eating tips   Minutes exercised this week 60 minutes  strength   Classes attended to date 3     Fun things you did since last meeting:"went to [place]. [place] camping"  Vanita Ingles 08/07/2016, 9:29 AM

## 2016-08-10 ENCOUNTER — Ambulatory Visit: Payer: BC Managed Care – PPO | Attending: Internal Medicine

## 2016-08-10 DIAGNOSIS — M62838 Other muscle spasm: Secondary | ICD-10-CM | POA: Insufficient documentation

## 2016-08-10 DIAGNOSIS — M542 Cervicalgia: Secondary | ICD-10-CM | POA: Insufficient documentation

## 2016-08-10 DIAGNOSIS — M546 Pain in thoracic spine: Secondary | ICD-10-CM

## 2016-08-10 DIAGNOSIS — M6281 Muscle weakness (generalized): Secondary | ICD-10-CM | POA: Diagnosis present

## 2016-08-10 NOTE — Therapy (Signed)
[facility] [address] [place], [place], [address] Phone: [phone]   Fax:  [phone]  Physical Therapy Treatment  Patient Details  Name: Regina [name] MRN: [id number] Date of Birth: 08-18-1965 Referring Provider: Dr. Lew Dawes  Encounter Date: 08/10/2016      PT End of Session - 08/10/16 1618    Visit Number 29   Date for PT Re-Evaluation 09/21/16   PT Start Time 1541  late   PT Stop Time 1625   PT Time Calculation (min) 44 min   Activity Tolerance Patient tolerated treatment well   Behavior During Therapy [facility] for tasks assessed/performed      Past Medical History:  Diagnosis Date  . Allergic urticaria   . ARTHRALGIA   . Arthritis   . Atrial fibrillation ([place])   . B12 DEFICIENCY   . CFS (chronic fatigue syndrome)   . Endometriosis   . Epidural hemorrhage with loss of consciousness ([place]) 06/29/15  . Fibroid   . Fibromyalgia   . Fracture of cervical vertebra, C6 ([place]) 06/29/15  . Gastritis   . GERD   . Heart murmur   . History of endometriosis   . IBS (irritable bowel syndrome)   . Internal hemorrhoids   . MVP (mitral valve prolapse)   . Osteoarthritis   . Rosacea   . Thoracic compression fracture ([place]) 06/29/15   T2-T6    Past Surgical History:  Procedure Laterality Date  . ABDOMINAL HYSTERECTOMY     partial  . ABDOMINAL HYSTERECTOMY    . ANAL FISSURE REPAIR     Dr Rise [name] 2009  . APPENDECTOMY    . COLPOSCOPY    . HEMORRHOID SURGERY     Dr. Rise [name] 2009  . OVARIAN CYST REMOVAL    . RT BUNIONECTOMT    . TONSILLECTOMY AND ADENOIDECTOMY    . TUBAL LIGATION    . UTERINE FIBROID SURGERY      There were no vitals filed for this visit.      Subjective Assessment - 08/10/16 1542    Subjective Lapse in treatment since 07/22/16.  Pt reports continued pain that is overall better.  No change since last session.  Saw Dr Tamala Julian for knee and wrist.     Pertinent History  fibromyalgia   Patient Stated Goals reduce pain, turn neck instead of body   Currently in Pain? Yes   Pain Score 2    Pain Location Neck   Pain Orientation Right;Left   Pain Descriptors / Indicators Sore   Pain Radiating Towards bil upper traps and thoracic spine   Pain Onset More than a month ago   Pain Frequency Intermittent   Aggravating Factors  turning head Lt and Rt, sitting for too long   Pain Relieving Factors heat, not turning head            [facility] PT Assessment - 08/10/16 0001      Assessment   Medical Diagnosis F07.81 Post concusion syndrome; S22.068D Other closed fracture of seventh thoracic vertebra lwith routine healing, subbsequent encounter; R51 Cervicogenic headache   Onset Date/Surgical Date 06/29/15     Observation/Other Assessments   Focus on Therapeutic Outcomes (FOTO)  44% limitation     AROM   Cervical - Right Side Bend 30   Cervical - Left Side Bend 35   Cervical - Right Rotation 60   Cervical - Left Rotation 60  [place] Adult PT Treatment/Exercise - 08/10/16 0001      Neck Exercises: Machines for Strengthening   UBE (Upper Arm Bike) Level 4 x 8 minutes (4/4)   therapist present     Moist Heat Therapy   Number Minutes Moist Heat 15 Minutes   Moist Heat Location Cervical  And lumbar     Manual Therapy   Manual Therapy Soft tissue mobilization   Manual therapy comments Pt supine   Soft tissue mobilization Suboccipitals, paraspinals, and cervical muscles; temporalis                  PT Short Term Goals - 06/04/16 1711      PT SHORT TERM GOAL #1   Title independent with initial HEP   Status Achieved     PT SHORT TERM GOAL #2   Title headaches decreased in frequency by 25%   Status Achieved     PT SHORT TERM GOAL #3   Title ability to turn her head with pain decreased >/= 25% due to increase tissue mobility   Status Achieved           PT Long Term Goals - 08/10/16 1548      PT LONG TERM  GOAL #1   Title independent with HEP and understand how to progress herself   Time 6   Period Weeks   Status On-going     PT LONG TERM GOAL #2   Title demonstrate cervical A/ROM rotation to > or = to 80 degrees to improve safety with driving   Time 6   Period Weeks   Status New     PT LONG TERM GOAL #3   Title turn neck with driving without increased pain   Baseline stiff and 2/10   Time 6   Status On-going     PT LONG TERM GOAL #4   Title incresaed strength and decrease pain  to vacuum and mop floors in her house without needing to take a rest   Status Achieved     PT LONG TERM GOAL #5   Title FOTO score </= 45% limitation   Baseline 44% limitation   Status Achieved     Additional Long Term Goals   Additional Long Term Goals Yes     PT LONG TERM GOAL #6   Title wear a back pack for work without incresaed pain   Status Achieved     PT LONG TERM GOAL #7   Title report a 90% reduction in the frequency and intensity of neck pain with work and home tasks   Time 8   Period Weeks   Status New               Plan - 08/10/16 1559    Clinical Impression Statement Pt with lapse in treatment since the start of care.  Pt reports 80% overall improvement since the start of care.  Pt with reduced and painful cervical rotation, palpable tenderness over Rt and Lt upper trap, cervical paraspinals and thoracic paraspinals.  Pt will benefit from skilled PT for strength, flexibility, manual and dry needling as needed to reduce pain and improve A/ROM with driving.   Rehab Potential Good   Clinical Impairments Affecting Rehab Potential Fell on 06/29/2015 resulting with fractured skull, fractured T7, fractured shoulder, had 12 weeks of [facility] Physical therapy   PT Frequency 2x / week   PT Duration 6 weeks   PT Treatment/Interventions Cryotherapy;Electrical Stimulation;Ultrasound;Traction;Moist Heat;Therapeutic activities;Therapeutic exercise;Neuromuscular re-education;Patient/family  education;Passive range  of motion;Manual techniques;Dry needling;Energy conservation;Taping   PT Next Visit Plan Continue strength for thoracic and UEs, cervcial A/ROM, manual and modalities.   Consulted and Agree with Plan of Care Patient      Patient will benefit from skilled therapeutic intervention in order to improve the following deficits and impairments:  Decreased range of motion, Increased fascial restricitons, Decreased endurance, Increased muscle spasms, Pain, Hypomobility, Impaired flexibility, Decreased mobility, Decreased strength, Decreased activity tolerance  Visit Diagnosis: Other muscle spasm - Plan: PT plan of care cert/re-cert  Muscle weakness (generalized) - Plan: PT plan of care cert/re-cert  Pain in thoracic spine - Plan: PT plan of care cert/re-cert  Cervicalgia - Plan: PT plan of care cert/re-cert     Problem List Patient Active Problem List   Diagnosis Date Noted  . Posterior right knee pain 07/16/2016  . Knee pain, right 05/21/2016  . Hamstring strain, sequela 05/21/2016  . Wart viral 05/12/2016  . Contusion of knee 05/05/2016  . Fall involving sidewalk curb 05/05/2016  . Aphasia 03/13/2016  . Shoulder pain, bilateral 12/11/2015  . Fibromyalgia 12/11/2015  . Post concussion syndrome 11/01/2015  . Clavicle fracture 10/30/2015  . Hair loss 10/11/2015  . Pain in joint, shoulder region 09/11/2015  . Right wrist pain 08/07/2015  . Splinter in skin 07/30/2015  . Fall from ladder 07/30/2015  . Skull fracture with concussion ([place]) 07/30/2015  . Fracture of cervical vertebra, C6 ([place]) 07/30/2015  . Thoracic vertebral fracture ([place]) 07/30/2015  . Abdominal pain, epigastric 05/07/2015  . Scaly patch rash 02/26/2015  . Mitral valve regurgitation 09/27/2014  . PAT (paroxysmal atrial tachycardia) ([place]) 09/06/2014  . Tachycardia 07/31/2014  . Sprain of ankle 06/29/2014  . Earache, left 05/15/2013  . Dizziness 05/15/2013  . Asthmatic bronchitis 05/08/2013   . Benign paroxysmal positional vertigo 05/08/2013  . Chest wall pain 01/26/2013  . LUQ abdominal pain 01/20/2013  . Insomnia 01/10/2013  . Depression (emotion) 01/10/2013  . URI, acute 08/25/2012  . Snoring 08/25/2012  . IBS (irritable bowel syndrome) 02/08/2012  . Multiple pulmonary nodules 02/08/2012  . Chest heaviness 11/16/2010  . GERD 05/24/2010  . SINUSITIS, MAXILLARY, CHRONIC 05/20/2010  . Myalgia and myositis 11/20/2009  . Fatigue 11/20/2009  . ARTHRALGIA 06/26/2009  . Cervicogenic headache 06/26/2009  . TOBACCO USE, QUIT 06/26/2009  . LOW BACK PAIN 02/20/2009  . Rosacea 12/27/2008  . PRURITUS 12/27/2008  . Allergic urticaria 12/27/2008  . B12 deficiency 01/02/2008    Sigurd Sos, PT 08/10/16 4:19 PM  [facility] [address] [place], [place], [address] Phone: [phone]   Fax:  [phone]  Name: Regina [name] MRN: [id number] Date of Birth: 07-09-1965

## 2016-08-11 ENCOUNTER — Ambulatory Visit (INDEPENDENT_AMBULATORY_CARE_PROVIDER_SITE_OTHER): Payer: BC Managed Care – PPO | Admitting: Family Medicine

## 2016-08-11 ENCOUNTER — Encounter: Payer: Self-pay | Admitting: Family Medicine

## 2016-08-11 DIAGNOSIS — S0291XS Unspecified fracture of skull, sequela: Secondary | ICD-10-CM

## 2016-08-11 DIAGNOSIS — M71331 Other bursal cyst, right wrist: Secondary | ICD-10-CM | POA: Diagnosis not present

## 2016-08-11 DIAGNOSIS — S060XAS Concussion with loss of consciousness status unknown, sequela: Secondary | ICD-10-CM

## 2016-08-11 DIAGNOSIS — S060X9S Concussion with loss of consciousness of unspecified duration, sequela: Secondary | ICD-10-CM

## 2016-08-11 DIAGNOSIS — M71339 Other bursal cyst, unspecified wrist: Secondary | ICD-10-CM | POA: Insufficient documentation

## 2016-08-11 NOTE — Progress Notes (Signed)
[name] [place], [place] [address] Phone: [phone] Subjective:    I'm seeing this patient by the request  of:    CC: right wrist pain Right knee pain  PXT:[id number]  Regina [name] is a 51 y.o. female coming in with complaint of Right wrist pain. Patient has been having pain for a long time. Patient did have a fall. Patient had what appeared to be a TFCC tear but MRI was unremarkable. Patient was having more pain on the dorsal aspect of the wrist. Patient was found to have a cyst but did not want an attempted aspiration. Patient was to be scheduled also for an EMG to see if any of this was radicular symptoms from the neck. Patient has not had that done but is scheduled earlier in June.  Patient is also having right knee pain. Not having as much pain. Stones dull, throbbing aching pain..     Past Medical History:  Diagnosis Date  . Allergic urticaria   . ARTHRALGIA   . Arthritis   . Atrial fibrillation ([place])   . B12 DEFICIENCY   . CFS (chronic fatigue syndrome)   . Endometriosis   . Epidural hemorrhage with loss of consciousness ([place]) 06/29/15  . Fibroid   . Fibromyalgia   . Fracture of cervical vertebra, C6 ([place]) 06/29/15  . Gastritis   . GERD   . Heart murmur   . History of endometriosis   . IBS (irritable bowel syndrome)   . Internal hemorrhoids   . MVP (mitral valve prolapse)   . Osteoarthritis   . Rosacea   . Thoracic compression fracture ([place]) 06/29/15   T2-T6   Past Surgical History:  Procedure Laterality Date  . ABDOMINAL HYSTERECTOMY     partial  . ABDOMINAL HYSTERECTOMY    . ANAL FISSURE REPAIR     Dr Rise [name] 2009  . APPENDECTOMY    . COLPOSCOPY    . HEMORRHOID SURGERY     Dr. Rise [name] 2009  . OVARIAN CYST REMOVAL    . RT BUNIONECTOMT    . TONSILLECTOMY AND ADENOIDECTOMY    . TUBAL LIGATION    . UTERINE FIBROID SURGERY     Social History   Social History  . Marital status: Married    Spouse name:  N/A  . Number of children: N/A  . Years of education: N/A   Social History Main Topics  . Smoking status: Former Smoker    Quit date: 10/31/1988  . Smokeless tobacco: Never Used  . Alcohol use 0.6 - 1.2 oz/week    1 - 2 Standard drinks or equivalent per week     Comment: occasional   . Drug use: No  . Sexual activity: Yes    Partners: Male   Other Topics Concern  . None   Social History Narrative   ** Merged History Encounter **       Working 2 jobs, 6d/wk      Regular exercise-no      Divorced   Allergies  Allergen Reactions  . Indocin [Indomethacin] Swelling, Rash and Other (See Comments)    REACTION: lupus like illness; butterfly rash, pain and swelling all over body.   . Morphine Sulfate Nausea And Vomiting  . Sulfacetamide Sodium Swelling  . Caffeine Other (See Comments)    Induces A-fib  . Morphine And Related     nausea  . Sulfa Antibiotics Swelling  . Azithromycin Rash    ALL MYCIN  DRUGS  . Erythromycin Stearate Hives and Swelling    REACTION: all mycins   Family History  Problem Relation Age of Onset  . Lung cancer Mother   . Thyroid cancer Mother   . Cancer Mother        INTESTINAL  . Healthy Father   . Diabetes Unknown        uncles/aunts  . Von Willebrand disease Daughter   . Asthma Neg Hx     Past medical history, social, surgical and family history all reviewed in electronic medical record.  No pertanent information unless stated regarding to the chief complaint.   Review of Systems: No headache, visual changes, nausea, vomiting, diarrhea, constipation, dizziness, abdominal pain, skin rash, fevers, chills, night sweats, weight loss, swollen lymph nodes, chest pain, shortness of breath, mood changes.  Positive muscle aches and body aches as well as joint swelling intermittently  Objective  Blood pressure 122/82, pulse 70, height 5\' 4"  (1.626 m), weight 166 lb (75.3 kg), SpO2 95 %.   Systems examined below as of 08/11/16 General: NAD A&O x3  mood, affect normal  HEENT: Pupils equal, extraocular movements intact no nystagmus Respiratory: not short of breath at rest or with speaking Cardiovascular: No lower extremity edema, non tender Skin: Warm dry intact with no signs of infection or rash on extremities or on axial skeleton. Abdomen: Soft nontender, no masses Neuro: Cranial nerves  intact, neurovascularly intact in all extremities with 2+ DTRs and 2+ pulses. Lymph: No lymphadenopathy appreciated today  Gait normal with good balance and coordination.  MSK: Non tender with full range of motion and good stability and symmetric strength and tone of shoulders, elbows, hips and ankles bilaterally.   Wrist: Right Inspection normal with no visible erythema or swelling. ROM smooth and normal with good flexion and extension and ulnar/radial deviation that is symmetrical with opposite wrist. Tender over the dorsal aspect of the TFCC. No snuffbox tenderness. No tenderness over [place]. Strength 5/5 in all directions without pain. Negative Finkelstein, tinel's and phalens. Negative Watson's test. Contralateral wrist unremarkable  Procedure: Real-time Ultrasound Guided Injection of right wrist cyst Device: GE Logiq Q7 Ultrasound guided injection is preferred based studies that show increased duration, increased effect, greater accuracy, decreased procedural pain, increased response rate, and decreased cost with ultrasound guided versus blind injection.  Verbal informed consent obtained.  Time-out conducted.  Noted no overlying erythema, induration, or other signs of local infection.  Skin prepped in a sterile fashion.  Local anesthesia: Topical Ethyl chloride.  With sterile technique and under real time ultrasound guidance:  With a 25-gauge half-inch needle patient was injected with a total of 0.5 mL of 0.5% Marcaine and 0.5 mL of Kenalog 40 mg/dL. Completed without difficulty  Pain immediately resolved suggesting accurate  placement of the medication.  Advised to call if fevers/chills, erythema, induration, drainage, or persistent bleeding.  Images permanently stored and available for review in the ultrasound unit.  Impression: Technically successful ultrasound guided injection.     Impression and Recommendations:     This case required medical decision making of moderate complexity.      Note: This dictation was prepared with Dragon dictation along with smaller phrase technology. Any transcriptional errors that result from this process are unintentional.

## 2016-08-11 NOTE — Assessment & Plan Note (Signed)
EMG ordered to rule out any type of C6-C7 nerve impingement.

## 2016-08-11 NOTE — Assessment & Plan Note (Signed)
Patient was given an injection today and tolerated the procedure well. We discussed icing regimen and home exercises. We discussed non-EMG would be beneficial to rule out anything else. Patient will start to increase activity. Follow-up again in 3 weeks

## 2016-08-12 ENCOUNTER — Ambulatory Visit: Payer: BC Managed Care – PPO

## 2016-08-12 DIAGNOSIS — M546 Pain in thoracic spine: Secondary | ICD-10-CM

## 2016-08-12 DIAGNOSIS — M62838 Other muscle spasm: Secondary | ICD-10-CM

## 2016-08-12 DIAGNOSIS — M542 Cervicalgia: Secondary | ICD-10-CM

## 2016-08-12 DIAGNOSIS — M6281 Muscle weakness (generalized): Secondary | ICD-10-CM

## 2016-08-12 NOTE — Therapy (Signed)
[facility] [address] [place], [place], [address] Phone: [phone]   Fax:  [phone]  Physical Therapy Treatment  Patient Details  Name: Regina [name] MRN: [id number] Date of Birth: 10/03/1965 Referring Provider: Dr. Lew Dawes  Encounter Date: 08/12/2016      PT End of Session - 08/12/16 1613    Visit Number 30   Date for PT Re-Evaluation 09/21/16   PT Start Time 1534  dry needling   PT Stop Time 1625   PT Time Calculation (min) 51 min   Activity Tolerance Patient tolerated treatment well   Behavior During Therapy [facility] for tasks assessed/performed      Past Medical History:  Diagnosis Date  . Allergic urticaria   . ARTHRALGIA   . Arthritis   . Atrial fibrillation ([place])   . B12 DEFICIENCY   . CFS (chronic fatigue syndrome)   . Endometriosis   . Epidural hemorrhage with loss of consciousness ([place]) 06/29/15  . Fibroid   . Fibromyalgia   . Fracture of cervical vertebra, C6 ([place]) 06/29/15  . Gastritis   . GERD   . Heart murmur   . History of endometriosis   . IBS (irritable bowel syndrome)   . Internal hemorrhoids   . MVP (mitral valve prolapse)   . Osteoarthritis   . Rosacea   . Thoracic compression fracture ([place]) 06/29/15   T2-T6    Past Surgical History:  Procedure Laterality Date  . ABDOMINAL HYSTERECTOMY     partial  . ABDOMINAL HYSTERECTOMY    . ANAL FISSURE REPAIR     Dr Rise [name] 2009  . APPENDECTOMY    . COLPOSCOPY    . HEMORRHOID SURGERY     Dr. Rise [name] 2009  . OVARIAN CYST REMOVAL    . RT BUNIONECTOMT    . TONSILLECTOMY AND ADENOIDECTOMY    . TUBAL LIGATION    . UTERINE FIBROID SURGERY      There were no vitals filed for this visit.      Subjective Assessment - 08/12/16 1538    Subjective I'm doing OK.  Want to do needling today since I haven't had it in a while.     Patient Stated Goals reduce pain, turn neck instead of body   Currently in Pain? Yes   Pain  Score 1    Pain Location Neck   Pain Orientation Right;Left   Pain Type Chronic pain   Pain Onset More than a month ago   Pain Frequency Intermittent                         [facility] Adult PT Treatment/Exercise - 08/12/16 0001      Neck Exercises: Machines for Strengthening   UBE (Upper Arm Bike) Level 4 x 8 minutes (4/4)   therapist present     Moist Heat Therapy   Number Minutes Moist Heat 15 Minutes   Moist Heat Location Cervical  And lumbar     Manual Therapy   Manual Therapy Soft tissue mobilization   Manual therapy comments Pt supine   Soft tissue mobilization Suboccipitals, paraspinals, and cervical muscles; temporalis          Trigger Point Dry Needling - 08/12/16 1541    Consent Given? Yes   Muscles Treated Upper Body Upper trapezius;Suboccipitals muscle group;Oblique capitus   Upper Trapezius Response Twitch reponse elicited;Palpable increased muscle length   Oblique Capitus Response Twitch response elicited;Palpable increased muscle length  SubOccipitals Response Twitch response elicited;Palpable increased muscle length   Rhomboids Response Twitch response elicited;Palpable increased muscle length                PT Short Term Goals - 06/04/16 1711      PT SHORT TERM GOAL #1   Title independent with initial HEP   Status Achieved     PT SHORT TERM GOAL #2   Title headaches decreased in frequency by 25%   Status Achieved     PT SHORT TERM GOAL #3   Title ability to turn her head with pain decreased >/= 25% due to increase tissue mobility   Status Achieved           PT Long Term Goals - 08/10/16 1548      PT LONG TERM GOAL #1   Title independent with HEP and understand how to progress herself   Time 6   Period Weeks   Status On-going     PT LONG TERM GOAL #2   Title demonstrate cervical A/ROM rotation to > or = to 80 degrees to improve safety with driving   Time 6   Period Weeks   Status New     PT LONG TERM GOAL #3    Title turn neck with driving without increased pain   Baseline stiff and 2/10   Time 6   Status On-going     PT LONG TERM GOAL #4   Title incresaed strength and decrease pain  to vacuum and mop floors in her house without needing to take a rest   Status Achieved     PT LONG TERM GOAL #5   Title FOTO score </= 45% limitation   Baseline 44% limitation   Status Achieved     Additional Long Term Goals   Additional Long Term Goals Yes     PT LONG TERM GOAL #6   Title wear a back pack for work without incresaed pain   Status Achieved     PT LONG TERM GOAL #7   Title report a 90% reduction in the frequency and intensity of neck pain with work and home tasks   Time 8   Period Weeks   Status New               Plan - 08/12/16 1541    Clinical Impression Statement Pt with active trigger points and tension in bil neck, upper trap and suboccipitals secondary to lapse in treatment.  Pt with improved mobility and reduced tension and pain after treatment today.  Pt will continue to benefit from skilled PT for manual as needed, thoracic and cervical strength and flexibility.     Rehab Potential Good   Clinical Impairments Affecting Rehab Potential Fell on 06/29/2015 resulting with fractured skull, fractured T7, fractured shoulder, had 12 weeks of [facility] Physical therapy   PT Frequency 2x / week   PT Duration 6 weeks   PT Treatment/Interventions Cryotherapy;Electrical Stimulation;Ultrasound;Traction;Moist Heat;Therapeutic activities;Therapeutic exercise;Neuromuscular re-education;Patient/family education;Passive range of motion;Manual techniques;Dry needling;Energy conservation;Taping   PT Next Visit Plan Continue strength for thoracic and UEs, cervcial A/ROM, manual and modalities.  Assess response to dry needling   Consulted and Agree with Plan of Care Patient      Patient will benefit from skilled therapeutic intervention in order to improve the following deficits and impairments:   Decreased range of motion, Increased fascial restricitons, Decreased endurance, Increased muscle spasms, Pain, Hypomobility, Impaired flexibility, Decreased mobility, Decreased strength, Decreased activity tolerance  Visit  Diagnosis: Other muscle spasm  Muscle weakness (generalized)  Pain in thoracic spine  Cervicalgia     Problem List Patient Active Problem List   Diagnosis Date Noted  . Other bursal cyst of wrist 08/11/2016  . Posterior right knee pain 07/16/2016  . Knee pain, right 05/21/2016  . Hamstring strain, sequela 05/21/2016  . Wart viral 05/12/2016  . Contusion of knee 05/05/2016  . Fall involving sidewalk curb 05/05/2016  . Aphasia 03/13/2016  . Shoulder pain, bilateral 12/11/2015  . Fibromyalgia 12/11/2015  . Post concussion syndrome 11/01/2015  . Clavicle fracture 10/30/2015  . Hair loss 10/11/2015  . Pain in joint, shoulder region 09/11/2015  . Right wrist pain 08/07/2015  . Splinter in skin 07/30/2015  . Fall from ladder 07/30/2015  . Skull fracture with concussion ([place]) 07/30/2015  . Fracture of cervical vertebra, C6 ([place]) 07/30/2015  . Thoracic vertebral fracture ([place]) 07/30/2015  . Abdominal pain, epigastric 05/07/2015  . Scaly patch rash 02/26/2015  . Mitral valve regurgitation 09/27/2014  . PAT (paroxysmal atrial tachycardia) ([place]) 09/06/2014  . Tachycardia 07/31/2014  . Sprain of ankle 06/29/2014  . Earache, left 05/15/2013  . Dizziness 05/15/2013  . Asthmatic bronchitis 05/08/2013  . Benign paroxysmal positional vertigo 05/08/2013  . Chest wall pain 01/26/2013  . LUQ abdominal pain 01/20/2013  . Insomnia 01/10/2013  . Depression (emotion) 01/10/2013  . URI, acute 08/25/2012  . Snoring 08/25/2012  . IBS (irritable bowel syndrome) 02/08/2012  . Multiple pulmonary nodules 02/08/2012  . Chest heaviness 11/16/2010  . GERD 05/24/2010  . SINUSITIS, MAXILLARY, CHRONIC 05/20/2010  . Myalgia and myositis 11/20/2009  . Fatigue 11/20/2009  .  ARTHRALGIA 06/26/2009  . Cervicogenic headache 06/26/2009  . TOBACCO USE, QUIT 06/26/2009  . LOW BACK PAIN 02/20/2009  . Rosacea 12/27/2008  . PRURITUS 12/27/2008  . Allergic urticaria 12/27/2008  . B12 deficiency 01/02/2008     Sigurd Sos, PT 08/12/16 4:15 PM  [facility] [address], [place], [place], [address] Phone: [phone]   Fax:  [phone]  Name: BRITLYN MARTINE MRN: [id number] Date of Birth: 12/26/1965

## 2016-08-12 NOTE — Progress Notes (Signed)
[facility] [employer] PREP Weekly Session   Patient Details  Name: Regina [name] MRN: [id number] Date of Birth: Aug 19, 1965 Age: 51 y.o. PCP: Cassandria Anger, MD  Vitals:   08/12/16 1016  Weight: 168 lb (76.2 kg)        [facility] [employer] Weekly seesion - 08/12/16 1000      Weekly Session   Topic Discussed Other ways to be active   Classes attended to date 4     Things you are grateful for:"family" Barriers:"been sick w/sinus headaches"   Vanita Ingles 08/12/2016, 10:17 AM

## 2016-08-17 ENCOUNTER — Ambulatory Visit: Payer: BC Managed Care – PPO | Admitting: Physical Therapy

## 2016-08-17 ENCOUNTER — Telehealth: Payer: Self-pay | Admitting: Physical Therapy

## 2016-08-17 NOTE — Telephone Encounter (Signed)
Patient contacted regarding missed visit. Patient was at doctor and had called during lunch and left a message.  Mikle Bosworth, PTA 08/17/16 3:52 PM

## 2016-08-19 ENCOUNTER — Encounter: Payer: Self-pay | Admitting: Physical Therapy

## 2016-08-19 ENCOUNTER — Ambulatory Visit: Payer: BC Managed Care – PPO | Admitting: Physical Therapy

## 2016-08-19 DIAGNOSIS — M546 Pain in thoracic spine: Secondary | ICD-10-CM

## 2016-08-19 DIAGNOSIS — M62838 Other muscle spasm: Secondary | ICD-10-CM

## 2016-08-19 DIAGNOSIS — M542 Cervicalgia: Secondary | ICD-10-CM

## 2016-08-19 DIAGNOSIS — M6281 Muscle weakness (generalized): Secondary | ICD-10-CM

## 2016-08-20 ENCOUNTER — Encounter: Payer: BC Managed Care – PPO | Admitting: Neurology

## 2016-08-20 NOTE — Therapy (Signed)
[facility] [address], [place], [place], [address] Phone: [phone]   Fax:  [phone]  Physical Therapy Treatment  Patient Details  Name: Regina [name] MRN: [id number] Date of Birth: 08-19-1965 Referring Provider: Dr. Lew Dawes  Encounter Date: 08/19/2016      PT End of Session - 08/19/16 1532    Visit Number 31   Date for PT Re-Evaluation 09/21/16   PT Start Time 1528   PT Stop Time 1628   PT Time Calculation (min) 60 min   Activity Tolerance Patient tolerated treatment well   Behavior During Therapy [facility] for tasks assessed/performed      Past Medical History:  Diagnosis Date  . Allergic urticaria   . ARTHRALGIA   . Arthritis   . Atrial fibrillation ([place])   . B12 DEFICIENCY   . CFS (chronic fatigue syndrome)   . Endometriosis   . Epidural hemorrhage with loss of consciousness ([place]) 06/29/15  . Fibroid   . Fibromyalgia   . Fracture of cervical vertebra, C6 ([place]) 06/29/15  . Gastritis   . GERD   . Heart murmur   . History of endometriosis   . IBS (irritable bowel syndrome)   . Internal hemorrhoids   . MVP (mitral valve prolapse)   . Osteoarthritis   . Rosacea   . Thoracic compression fracture ([place]) 06/29/15   T2-T6    Past Surgical History:  Procedure Laterality Date  . ABDOMINAL HYSTERECTOMY     partial  . ABDOMINAL HYSTERECTOMY    . ANAL FISSURE REPAIR     Dr Rise [name] 2009  . APPENDECTOMY    . COLPOSCOPY    . HEMORRHOID SURGERY     Dr. Rise [name] 2009  . OVARIAN CYST REMOVAL    . RT BUNIONECTOMT    . TONSILLECTOMY AND ADENOIDECTOMY    . TUBAL LIGATION    . UTERINE FIBROID SURGERY      There were no vitals filed for this visit.      Subjective Assessment - 08/19/16 1531    Subjective I definatley need to get dry needling soon. My wrist has been giving me a fit. Back is better, not great though, but better.    Pertinent History fibromyalgia   Currently in Pain? Yes    Pain Score 2    Pain Location Neck   Pain Orientation Right;Left   Pain Descriptors / Indicators Sore   Pain Type Chronic pain   Pain Score 2   Pain Location Back   Pain Orientation Right;Left   Pain Descriptors / Indicators Sharp   Pain Type Chronic pain                                   PT Short Term Goals - 06/04/16 1711      PT SHORT TERM GOAL #1   Title independent with initial HEP   Status Achieved     PT SHORT TERM GOAL #2   Title headaches decreased in frequency by 25%   Status Achieved     PT SHORT TERM GOAL #3   Title ability to turn her head with pain decreased >/= 25% due to increase tissue mobility   Status Achieved           PT Long Term Goals - 08/19/16 1529      PT LONG TERM GOAL #1   Title independent with HEP and understand  how to progress herself   Time 6   Period Weeks   Status On-going     PT LONG TERM GOAL #2   Title demonstrate cervical A/ROM rotation to > or = to 80 degrees to improve safety with driving   Time 6   Period Weeks   Status On-going     PT LONG TERM GOAL #3   Title turn neck with driving without increased pain   Time 6   Period Weeks   Status On-going     PT LONG TERM GOAL #7   Title report a 90% reduction in the frequency and intensity of neck pain with work and home tasks   Time 8   Period Weeks   Status On-going               Plan - 08/19/16 1615    Clinical Impression Statement Patient with tension and tenderness in left upper back and right lower back. Patient responded well to soft tissue mobilization and modalities. Did well with exercises but having vertigo with laying supine. Patient will continue to benefit from skilled thearpy for cervical stability and core strengthening.    Rehab Potential Good   Clinical Impairments Affecting Rehab Potential Fell on 06/29/2015 resulting with fractured skull, fractured T7, fractured shoulder, had 12 weeks of [facility]   PT  Frequency 2x / week   PT Duration 6 weeks   PT Treatment/Interventions Cryotherapy;Electrical Stimulation;Ultrasound;Traction;Moist Heat;Therapeutic activities;Therapeutic exercise;Neuromuscular re-education;Patient/family education;Passive range of motion;Manual techniques;Dry needling;Energy conservation;Taping   PT Next Visit Plan Continue strength for thoracic and UEs, cervcial A/ROM, manual and modalities.  Continue with dry needling.   Consulted and Agree with Plan of Care Patient      Patient will benefit from skilled therapeutic intervention in order to improve the following deficits and impairments:  Decreased range of motion, Increased fascial restricitons, Decreased endurance, Increased muscle spasms, Pain, Hypomobility, Impaired flexibility, Decreased mobility, Decreased strength, Decreased activity tolerance  Visit Diagnosis: Other muscle spasm  Muscle weakness (generalized)  Pain in thoracic spine  Cervicalgia     Problem List Patient Active Problem List   Diagnosis Date Noted  . Other bursal cyst of wrist 08/11/2016  . Posterior right knee pain 07/16/2016  . Knee pain, right 05/21/2016  . Hamstring strain, sequela 05/21/2016  . Wart viral 05/12/2016  . Contusion of knee 05/05/2016  . Fall involving sidewalk curb 05/05/2016  . Aphasia 03/13/2016  . Shoulder pain, bilateral 12/11/2015  . Fibromyalgia 12/11/2015  . Post concussion syndrome 11/01/2015  . Clavicle fracture 10/30/2015  . Hair loss 10/11/2015  . Pain in joint, shoulder region 09/11/2015  . Right wrist pain 08/07/2015  . Splinter in skin 07/30/2015  . Fall from ladder 07/30/2015  . Skull fracture with concussion ([place]) 07/30/2015  . Fracture of cervical vertebra, C6 ([place]) 07/30/2015  . Thoracic vertebral fracture ([place]) 07/30/2015  . Abdominal pain, epigastric 05/07/2015  . Scaly patch rash 02/26/2015  . Mitral valve regurgitation 09/27/2014  . PAT (paroxysmal atrial tachycardia) ([place]) 09/06/2014   . Tachycardia 07/31/2014  . Sprain of ankle 06/29/2014  . Earache, left 05/15/2013  . Dizziness 05/15/2013  . Asthmatic bronchitis 05/08/2013  . Benign paroxysmal positional vertigo 05/08/2013  . Chest wall pain 01/26/2013  . LUQ abdominal pain 01/20/2013  . Insomnia 01/10/2013  . Depression (emotion) 01/10/2013  . URI, acute 08/25/2012  . Snoring 08/25/2012  . IBS (irritable bowel syndrome) 02/08/2012  . Multiple pulmonary nodules 02/08/2012  . Chest heaviness 11/16/2010  .  GERD 05/24/2010  . SINUSITIS, MAXILLARY, CHRONIC 05/20/2010  . Myalgia and myositis 11/20/2009  . Fatigue 11/20/2009  . ARTHRALGIA 06/26/2009  . Cervicogenic headache 06/26/2009  . TOBACCO USE, QUIT 06/26/2009  . LOW BACK PAIN 02/20/2009  . Rosacea 12/27/2008  . PRURITUS 12/27/2008  . Allergic urticaria 12/27/2008  . B12 deficiency 01/02/2008    Regina [name] PTA 08/20/2016, 8:53 AM  [facility] [address], [place], [place], [address] Phone: [phone]   Fax:  [phone]  Name: Regina [name] MRN: [id number] Date of Birth: 10/17/1965

## 2016-08-26 ENCOUNTER — Encounter: Payer: BC Managed Care – PPO | Admitting: Physical Therapy

## 2016-08-26 ENCOUNTER — Ambulatory Visit: Payer: BC Managed Care – PPO

## 2016-08-26 DIAGNOSIS — M62838 Other muscle spasm: Secondary | ICD-10-CM | POA: Diagnosis not present

## 2016-08-26 DIAGNOSIS — M542 Cervicalgia: Secondary | ICD-10-CM

## 2016-08-26 DIAGNOSIS — M546 Pain in thoracic spine: Secondary | ICD-10-CM

## 2016-08-26 NOTE — Therapy (Signed)
[facility] [address] [place], [place], [address] Phone: [phone]   Fax:  [phone]  Physical Therapy Treatment  Patient Details  Name: Regina [name] MRN: [id number] Date of Birth: 1966-01-26 Referring Provider: Dr. Lew Dawes  Encounter Date: 08/26/2016      PT End of Session - 08/26/16 1654    Visit Number 32   Date for PT Re-Evaluation 09/21/16   PT Start Time 1619  dry needling   PT Stop Time 1700   PT Time Calculation (min) 41 min   Activity Tolerance Patient tolerated treatment well;Patient limited by pain   Behavior During Therapy [facility] for tasks assessed/performed      Past Medical History:  Diagnosis Date  . Allergic urticaria   . ARTHRALGIA   . Arthritis   . Atrial fibrillation ([place])   . B12 DEFICIENCY   . CFS (chronic fatigue syndrome)   . Endometriosis   . Epidural hemorrhage with loss of consciousness ([place]) 06/29/15  . Fibroid   . Fibromyalgia   . Fracture of cervical vertebra, C6 ([place]) 06/29/15  . Gastritis   . GERD   . Heart murmur   . History of endometriosis   . IBS (irritable bowel syndrome)   . Internal hemorrhoids   . MVP (mitral valve prolapse)   . Osteoarthritis   . Rosacea   . Thoracic compression fracture ([place]) 06/29/15   T2-T6    Past Surgical History:  Procedure Laterality Date  . ABDOMINAL HYSTERECTOMY     partial  . ABDOMINAL HYSTERECTOMY    . ANAL FISSURE REPAIR     Dr Rise [name] 2009  . APPENDECTOMY    . COLPOSCOPY    . HEMORRHOID SURGERY     Dr. Rise [name] 2009  . OVARIAN CYST REMOVAL    . RT BUNIONECTOMT    . TONSILLECTOMY AND ADENOIDECTOMY    . TUBAL LIGATION    . UTERINE FIBROID SURGERY      There were no vitals filed for this visit.      Subjective Assessment - 08/26/16 1625    Subjective I felt a lot of relief after dry needling last time we did it.  My Rt jaw is bothering me.  Rt arm is 4/10 so doesn't want to do the arm bike.   Currently in Pain? Yes   Pain Location Neck   Pain Orientation Left;Right   Pain Descriptors / Indicators Sore   Pain Type Chronic pain   Pain Radiating Towards bil Upper traps    Pain Onset More than a month ago   Pain Frequency Intermittent   Aggravating Factors  turning head to the Lt and Rt, sitting for too long   Pain Relieving Factors heat, not turning head                         [facility] Adult PT Treatment/Exercise - 08/26/16 0001      Moist Heat Therapy   Number Minutes Moist Heat 12 Minutes   Moist Heat Location Cervical     Manual Therapy   Manual Therapy Soft tissue mobilization   Manual therapy comments Pt supine   Soft tissue mobilization Suboccipitals, paraspinals, and cervical muscles; temporalis          Trigger Point Dry Needling - 08/26/16 1627    Consent Given? Yes   Muscles Treated Upper Body Upper trapezius;Suboccipitals muscle group   Upper Trapezius Response Twitch reponse elicited;Palpable increased muscle length  Oblique Capitus Response Twitch response elicited;Palpable increased muscle length   SubOccipitals Response Twitch response elicited;Palpable increased muscle length                PT Short Term Goals - 06/04/16 1711      PT SHORT TERM GOAL #1   Title independent with initial HEP   Status Achieved     PT SHORT TERM GOAL #2   Title headaches decreased in frequency by 25%   Status Achieved     PT SHORT TERM GOAL #3   Title ability to turn her head with pain decreased >/= 25% due to increase tissue mobility   Status Achieved           PT Long Term Goals - 08/26/16 1650      PT LONG TERM GOAL #1   Title independent with HEP and understand how to progress herself   Time 6   Period Weeks   Status On-going     PT LONG TERM GOAL #3   Title turn neck with driving without increased pain   Time 6   Status On-going     PT LONG TERM GOAL #7   Title report a 90% reduction in the frequency and intensity of  neck pain with work and home tasks   Time 8   Period Weeks   Status On-going               Plan - 08/26/16 1651    Clinical Impression Statement Pt with increased Rt UE pain so declined arm bike and exercise today.  Pt with significant pain reduction after dry needling 2 weeks ago and wanted to do again today.  Pt reports 75% reduction in neck pain overall.  Pain reported with Lt rotation.  Pt demonstrates significant improvement in cervical muscle tension over the past 2 weeks.  Pt will continue to benefit from skilled PT for cervical flexibility, strength, manual and pain management as needed.     Rehab Potential Good   PT Frequency 2x / week   PT Duration 6 weeks   PT Treatment/Interventions Cryotherapy;Electrical Stimulation;Ultrasound;Traction;Moist Heat;Therapeutic activities;Therapeutic exercise;Neuromuscular re-education;Patient/family education;Passive range of motion;Manual techniques;Dry needling;Energy conservation;Taping   PT Next Visit Plan Continue strength for thoracic and UEs, cervcial A/ROM, manual and modalities.  Continue with dry needling as needed.     Consulted and Agree with Plan of Care Patient      Patient will benefit from skilled therapeutic intervention in order to improve the following deficits and impairments:  Decreased range of motion, Increased fascial restricitons, Decreased endurance, Increased muscle spasms, Pain, Hypomobility, Impaired flexibility, Decreased mobility, Decreased strength, Decreased activity tolerance  Visit Diagnosis: Other muscle spasm  Cervicalgia  Pain in thoracic spine     Problem List Patient Active Problem List   Diagnosis Date Noted  . Other bursal cyst of wrist 08/11/2016  . Posterior right knee pain 07/16/2016  . Knee pain, right 05/21/2016  . Hamstring strain, sequela 05/21/2016  . Wart viral 05/12/2016  . Contusion of knee 05/05/2016  . Fall involving sidewalk curb 05/05/2016  . Aphasia 03/13/2016  .  Shoulder pain, bilateral 12/11/2015  . Fibromyalgia 12/11/2015  . Post concussion syndrome 11/01/2015  . Clavicle fracture 10/30/2015  . Hair loss 10/11/2015  . Pain in joint, shoulder region 09/11/2015  . Right wrist pain 08/07/2015  . Splinter in skin 07/30/2015  . Fall from ladder 07/30/2015  . Skull fracture with concussion ([place]) 07/30/2015  . Fracture of cervical vertebra,  C6 ([place]) 07/30/2015  . Thoracic vertebral fracture ([place]) 07/30/2015  . Abdominal pain, epigastric 05/07/2015  . Scaly patch rash 02/26/2015  . Mitral valve regurgitation 09/27/2014  . PAT (paroxysmal atrial tachycardia) ([place]) 09/06/2014  . Tachycardia 07/31/2014  . Sprain of ankle 06/29/2014  . Earache, left 05/15/2013  . Dizziness 05/15/2013  . Asthmatic bronchitis 05/08/2013  . Benign paroxysmal positional vertigo 05/08/2013  . Chest wall pain 01/26/2013  . LUQ abdominal pain 01/20/2013  . Insomnia 01/10/2013  . Depression (emotion) 01/10/2013  . URI, acute 08/25/2012  . Snoring 08/25/2012  . IBS (irritable bowel syndrome) 02/08/2012  . Multiple pulmonary nodules 02/08/2012  . Chest heaviness 11/16/2010  . GERD 05/24/2010  . SINUSITIS, MAXILLARY, CHRONIC 05/20/2010  . Myalgia and myositis 11/20/2009  . Fatigue 11/20/2009  . ARTHRALGIA 06/26/2009  . Cervicogenic headache 06/26/2009  . TOBACCO USE, QUIT 06/26/2009  . LOW BACK PAIN 02/20/2009  . Rosacea 12/27/2008  . PRURITUS 12/27/2008  . Allergic urticaria 12/27/2008  . B12 deficiency 01/02/2008     Sigurd Sos, PT 08/26/16 4:57 PM  [facility] [address] [place], [place], [address] Phone: [phone]   Fax:  [phone]  Name: AREEBA SULSER MRN: [id number] Date of Birth: 13-Sep-1965

## 2016-08-31 ENCOUNTER — Ambulatory Visit: Payer: BC Managed Care – PPO | Attending: Internal Medicine

## 2016-08-31 DIAGNOSIS — M62838 Other muscle spasm: Secondary | ICD-10-CM | POA: Diagnosis present

## 2016-08-31 DIAGNOSIS — M6281 Muscle weakness (generalized): Secondary | ICD-10-CM | POA: Insufficient documentation

## 2016-08-31 DIAGNOSIS — M546 Pain in thoracic spine: Secondary | ICD-10-CM | POA: Diagnosis present

## 2016-08-31 DIAGNOSIS — M542 Cervicalgia: Secondary | ICD-10-CM | POA: Insufficient documentation

## 2016-08-31 NOTE — Therapy (Signed)
[facility] [address] [place], [place], [address] Phone: [phone]   Fax:  [phone]  Physical Therapy Treatment  Patient Details  Name: Regina [name] MRN: [id number] Date of Birth: [birth year] Referring Provider: Dr. Lew Dawes  Encounter Date: 08/31/2016      PT End of Session - 08/31/16 1610    Visit Number 33   Date for PT Re-Evaluation 09/21/16   PT Start Time 1536   PT Stop Time 1625   PT Time Calculation (min) 49 min   Activity Tolerance Patient tolerated treatment well   Behavior During Therapy [facility] for tasks assessed/performed      Past Medical History:  Diagnosis Date  . Allergic urticaria   . ARTHRALGIA   . Arthritis   . Atrial fibrillation ([place])   . B12 DEFICIENCY   . CFS (chronic fatigue syndrome)   . Endometriosis   . Epidural hemorrhage with loss of consciousness ([place]) 06/29/15  . Fibroid   . Fibromyalgia   . Fracture of cervical vertebra, C6 ([place]) 06/29/15  . Gastritis   . GERD   . Heart murmur   . History of endometriosis   . IBS (irritable bowel syndrome)   . Internal hemorrhoids   . MVP (mitral valve prolapse)   . Osteoarthritis   . Rosacea   . Thoracic compression fracture ([place]) 06/29/15   T2-T6    Past Surgical History:  Procedure Laterality Date  . ABDOMINAL HYSTERECTOMY     partial  . ABDOMINAL HYSTERECTOMY    . ANAL FISSURE REPAIR     Dr Rise [name] 2009  . APPENDECTOMY    . COLPOSCOPY    . HEMORRHOID SURGERY     Dr. Rise [name] 2009  . OVARIAN CYST REMOVAL    . RT BUNIONECTOMT    . TONSILLECTOMY AND ADENOIDECTOMY    . TUBAL LIGATION    . UTERINE FIBROID SURGERY      There were no vitals filed for this visit.      Subjective Assessment - 08/31/16 1541    Subjective Rt arm is still bothering me.  Nerve conduction velocity test scheduled for tomorrow.     Patient Stated Goals reduce pain, turn neck instead of body   Currently in Pain? Yes   Pain Score 1     Pain Location Neck   Pain Orientation Left;Right   Pain Onset More than a month ago   Pain Frequency Intermittent   Aggravating Factors  turning head to the Lt and Rt, sitting too long   Pain Relieving Factors heat, not turning head            [facility] PT Assessment - 08/31/16 0001      AROM   Cervical - Right Rotation 50   Cervical - Left Rotation 50                     [facility] Adult PT Treatment/Exercise - 08/31/16 0001      Neck Exercises: Theraband   Horizontal ABduction Green;20 reps   Other Theraband Exercises D2, horizontal abduction and ER with green band 2x10     Neck Exercises: Seated   Shoulder Flexion Both;20 reps   Shoulder Flexion Weights (lbs) 1   Shoulder ABduction 20 reps;Both   Shoulder Abduction Weights (lbs) 1   Other Seated Exercise upper trap stretch, scalene stretch, pec stretch, nerve glides radial and median     Moist Heat Therapy   Number Minutes Moist Heat  12 Minutes   Moist Heat Location Cervical     Manual Therapy   Manual Therapy Soft tissue mobilization   Manual therapy comments Pt supine   Soft tissue mobilization Suboccipitals, paraspinals, and cervical muscles                  PT Short Term Goals - 06/04/16 1711      PT SHORT TERM GOAL #1   Title independent with initial HEP   Status Achieved     PT SHORT TERM GOAL #2   Title headaches decreased in frequency by 25%   Status Achieved     PT SHORT TERM GOAL #3   Title ability to turn her head with pain decreased >/= 25% due to increase tissue mobility   Status Achieved           PT Long Term Goals - 08/31/16 1544      PT LONG TERM GOAL #1   Title independent with HEP and understand how to progress herself   Time 6   Period Weeks   Status On-going     PT LONG TERM GOAL #2   Title demonstrate cervical A/ROM rotation to > or = to 80 degrees to improve safety with driving     PT LONG TERM GOAL #3   Title turn neck with driving without increased  pain   Baseline just with turning left   Time 6   Period Weeks   Status On-going     PT LONG TERM GOAL #4   Title incresaed strength and decrease pain  to vacuum and mop floors in her house without needing to take a rest   Status Achieved     PT LONG TERM GOAL #7   Title report a 90% reduction in the frequency and intensity of neck pain with work and home tasks   Baseline 80%   Time 8   Period Weeks   Status On-going               Plan - 08/31/16 1546    Clinical Impression Statement Pt reports 80% overall improvement in symptoms since the start of care.  Some exercise in the clinic is limited due to Rt UE pain.  Pt with pain with turning to the Lt only with driving.  Cervical A/ROM is 50 degrees bilaterally.  Pt with signficant reduction in muscle tension over the past 3 weeks.  Pt will continue to benefit from skilled PT for strength, endurance and manual as needed.     Clinical Impairments Affecting Rehab Potential Fell on 06/29/2015 resulting with fractured skull, fractured T7, fractured shoulder, had 12 weeks of [facility]   PT Frequency 2x / week   PT Duration 6 weeks   PT Treatment/Interventions Cryotherapy;Electrical Stimulation;Ultrasound;Traction;Moist Heat;Therapeutic activities;Therapeutic exercise;Neuromuscular re-education;Patient/family education;Passive range of motion;Manual techniques;Dry needling;Energy conservation;Taping   PT Next Visit Plan Continue strength for thoracic and UEs, cervcial A/ROM, manual and modalities.  Continue with dry needling as needed.        Patient will benefit from skilled therapeutic intervention in order to improve the following deficits and impairments:  Decreased range of motion, Increased fascial restricitons, Decreased endurance, Increased muscle spasms, Pain, Hypomobility, Impaired flexibility, Decreased mobility, Decreased strength, Decreased activity tolerance  Visit Diagnosis: Other muscle  spasm  Cervicalgia  Pain in thoracic spine  Muscle weakness (generalized)     Problem List Patient Active Problem List   Diagnosis Date Noted  . Other bursal cyst of wrist  08/11/2016  . Posterior right knee pain 07/16/2016  . Knee pain, right 05/21/2016  . Hamstring strain, sequela 05/21/2016  . Wart viral 05/12/2016  . Contusion of knee 05/05/2016  . Fall involving sidewalk curb 05/05/2016  . Aphasia 03/13/2016  . Shoulder pain, bilateral 12/11/2015  . Fibromyalgia 12/11/2015  . Post concussion syndrome 11/01/2015  . Clavicle fracture 10/30/2015  . Hair loss 10/11/2015  . Pain in joint, shoulder region 09/11/2015  . Right wrist pain 08/07/2015  . Splinter in skin 07/30/2015  . Fall from ladder 07/30/2015  . Skull fracture with concussion ([place]) 07/30/2015  . Fracture of cervical vertebra, C6 ([place]) 07/30/2015  . Thoracic vertebral fracture ([place]) 07/30/2015  . Abdominal pain, epigastric 05/07/2015  . Scaly patch rash 02/26/2015  . Mitral valve regurgitation 09/27/2014  . PAT (paroxysmal atrial tachycardia) ([place]) 09/06/2014  . Tachycardia 07/31/2014  . Sprain of ankle 06/29/2014  . Earache, left 05/15/2013  . Dizziness 05/15/2013  . Asthmatic bronchitis 05/08/2013  . Benign paroxysmal positional vertigo 05/08/2013  . Chest wall pain 01/26/2013  . LUQ abdominal pain 01/20/2013  . Insomnia 01/10/2013  . Depression (emotion) 01/10/2013  . URI, acute 08/25/2012  . Snoring 08/25/2012  . IBS (irritable bowel syndrome) 02/08/2012  . Multiple pulmonary nodules 02/08/2012  . Chest heaviness 11/16/2010  . GERD 05/24/2010  . SINUSITIS, MAXILLARY, CHRONIC 05/20/2010  . Myalgia and myositis 11/20/2009  . Fatigue 11/20/2009  . ARTHRALGIA 06/26/2009  . Cervicogenic headache 06/26/2009  . TOBACCO USE, QUIT 06/26/2009  . LOW BACK PAIN 02/20/2009  . Rosacea 12/27/2008  . PRURITUS 12/27/2008  . Allergic urticaria 12/27/2008  . B12 deficiency 01/02/2008     Sigurd Sos,  PT 08/31/16 4:15 PM  [facility] [address] [place], [place], [address] Phone: [phone]   Fax:  [phone]  Name: Regina [name] MRN: [id number] Date of Birth: 06-Jun-1965

## 2016-09-01 ENCOUNTER — Ambulatory Visit (INDEPENDENT_AMBULATORY_CARE_PROVIDER_SITE_OTHER): Payer: BC Managed Care – PPO | Admitting: Neurology

## 2016-09-01 DIAGNOSIS — M25531 Pain in right wrist: Secondary | ICD-10-CM | POA: Diagnosis not present

## 2016-09-01 DIAGNOSIS — M79601 Pain in right arm: Secondary | ICD-10-CM

## 2016-09-01 NOTE — Procedures (Signed)
[facility] Neurology  [place], [place]  [place], [place] [address] Tel: [phone] Fax:  [phone] Test Date:  09/01/2016  Patient: Regina [name] DOB: 29-Jun-1965 Physician: Narda Amber, DO  Sex: Female Height: 5\' 4"  Ref Phys: Hulan Saas, D.O.  ID#: [id number] Temp: 35.3C Technician:    Patient Complaints: This is a 51 year-old female referred for evaluation of right hand pain and paresthesias.  NCV & EMG Findings: Extensive electrodiagnostic testing of the right upper extremity shows:  1. Right median, ulnar, and mixed palmar sensory responses are within normal limits. 2. Right median and ulnar motor responses are within normal limits. 3. There is no evidence of active or chronic motor axon loss changes affecting any of the tested muscles. Motor unit configuration and recruitment pattern is within normal limits.  Impression: This is a normal study.   In particular, there is no evidence of a cervical radiculopathy or carpal tunnel syndrome affecting the right side.   ___________________________ Narda Amber, DO    Nerve Conduction Studies Anti Sensory Summary Table   Site NR Peak (ms) Norm Peak (ms) P-T Amp (V) Norm P-T Amp  Right Median Anti Sensory (2nd Digit)  35.3C  Wrist    2.8 <3.6 63.6 >15  Right Ulnar Anti Sensory (5th Digit)  35.3C  Wrist    2.4 <3.1 50.7 >10   Motor Summary Table   Site NR Onset (ms) Norm Onset (ms) O-P Amp (mV) Norm O-P Amp Site1 Site2 Delta-0 (ms) Dist (cm) Vel (m/s) Norm Vel (m/s)  Right Median Motor (Abd Poll Brev)  35.3C  Wrist    2.4 <4.0 12.5 >6 Elbow Wrist 4.4 28.0 64 >50  Elbow    6.8  11.7         Right Ulnar Motor (Abd Dig Minimi)  35.3C  Wrist    2.1 <3.1 9.2 >7 B Elbow Wrist 3.1 20.0 65 >50  B Elbow    5.2  8.9  A Elbow B Elbow 1.5 10.0 67 >50  A Elbow    6.7  7.8          Comparison Summary Table   Site NR Peak (ms) Norm Peak (ms) P-T Amp (V) Site1 Site2 Delta-P (ms) Norm Delta (ms)  Right  Median/Ulnar Palm Comparison (Wrist - 8cm)  35.3C  Median Palm    1.6 <2.2 75.9 Median Palm Ulnar Palm 0.3   Ulnar Palm    1.3 <2.2 20.4       EMG   Side Muscle Ins Act Fibs Psw Fasc Number Recrt Dur Dur. Amp Amp. Poly Poly. Comment  Right 1stDorInt Nml Nml Nml Nml Nml Nml Nml Nml Nml Nml Nml Nml N/A  Right Ext Indicis Nml Nml Nml Nml Nml Nml Nml Nml Nml Nml Nml Nml N/A  Right PronatorTeres Nml Nml Nml Nml Nml Nml Nml Nml Nml Nml Nml Nml N/A  Right Biceps Nml Nml Nml Nml Nml Nml Nml Nml Nml Nml Nml Nml N/A  Right Triceps Nml Nml Nml Nml Nml Nml Nml Nml Nml Nml Nml Nml N/A  Right Deltoid Nml Nml Nml Nml Nml Nml Nml Nml Nml Nml Nml Nml N/A      Waveforms:

## 2016-09-04 NOTE — Progress Notes (Signed)
[facility] [employer] PRE[facility] Weekly Session   Patient Details  Name: MISTY FOUTZ MRN: [id number] Date of Birth: Aug 01, 1965 Age: 51 y.o. PCP: Cassandria Anger, MD  Vitals:   08/31/16 1212  Weight: 166 lb 3.2 oz (75.4 kg)        [facility] [employer] Weekly seesion - 09/04/16 1200      Weekly Session   Topic Discussed Stress management and problem solving   Minutes exercised this week 120 minutes  cardio   Classes attended to date 5     Fun things you did since last meeting:"camping" Things you are grateful for:"my husband" Nutrition celebrations:"none"   Vanita Ingles 09/04/2016, 12:13 PM

## 2016-09-07 ENCOUNTER — Encounter: Payer: Self-pay | Admitting: Family Medicine

## 2016-09-07 ENCOUNTER — Ambulatory Visit (INDEPENDENT_AMBULATORY_CARE_PROVIDER_SITE_OTHER): Payer: BC Managed Care – PPO | Admitting: Family Medicine

## 2016-09-07 ENCOUNTER — Ambulatory Visit: Payer: BC Managed Care – PPO

## 2016-09-07 VITALS — BP 110/80 | HR 76 | Ht 64.0 in | Wt 167.0 lb

## 2016-09-07 DIAGNOSIS — M62838 Other muscle spasm: Secondary | ICD-10-CM

## 2016-09-07 DIAGNOSIS — M25531 Pain in right wrist: Secondary | ICD-10-CM | POA: Diagnosis not present

## 2016-09-07 DIAGNOSIS — M216X1 Other acquired deformities of right foot: Secondary | ICD-10-CM | POA: Diagnosis not present

## 2016-09-07 DIAGNOSIS — M546 Pain in thoracic spine: Secondary | ICD-10-CM

## 2016-09-07 DIAGNOSIS — M542 Cervicalgia: Secondary | ICD-10-CM

## 2016-09-07 NOTE — Progress Notes (Signed)
[name] [place],  [address] Phone: [phone] Subjective:    I'm seeing this patient by the request  of:    CC: right wrist pain  Foot pain  XAJ:OINOMVEHMC  Regina [name] is a 51 y.o. female coming in with complaint of Right wrist pain. Patient has been having pain for a long time. Patient did have a fall. Patient had what appeared to be a TFCC tear but MRI was unremarkable. Patient was having more pain on the dorsal aspect of the wrist. Patient was found to have a cyst have injection at last exam. Patient states did not help with the pain seemed to be centralized around the wrist but is having the pain radiating up the arm again. States that over the course of time the pain is worsening as well.  Did have an EMG done of the upper extremity. This was independently visualized by me showing no true nerve difficulty.  Patient is also having more of a foot pain. Past medical history significant for multiple surgeries of the foot. Patient states that the pain seems to be on the dorsal aspect of the foot. More around the first and second toes. Wondering and no which she can potentially do. Once to avoid any type of surgery. Denies any swelling, numbness, any weakness of the foot.     Past Medical History:  Diagnosis Date  . Allergic urticaria   . ARTHRALGIA   . Arthritis   . Atrial fibrillation ([place])   . B12 DEFICIENCY   . CFS (chronic fatigue syndrome)   . Endometriosis   . Epidural hemorrhage with loss of consciousness ([place]) 06/29/15  . Fibroid   . Fibromyalgia   . Fracture of cervical vertebra, C6 ([place]) 06/29/15  . Gastritis   . GERD   . Heart murmur   . History of endometriosis   . IBS (irritable bowel syndrome)   . Internal hemorrhoids   . MVP (mitral valve prolapse)   . Osteoarthritis   . Rosacea   . Thoracic compression fracture ([place]) 06/29/15   T2-T6   Past Surgical History:  Procedure Laterality Date  . ABDOMINAL  HYSTERECTOMY     partial  . ABDOMINAL HYSTERECTOMY    . ANAL FISSURE REPAIR     Dr Rise [name] 2009  . APPENDECTOMY    . COLPOSCOPY    . HEMORRHOID SURGERY     Dr. Rise [name] 2009  . OVARIAN CYST REMOVAL    . RT BUNIONECTOMT    . TONSILLECTOMY AND ADENOIDECTOMY    . TUBAL LIGATION    . UTERINE FIBROID SURGERY     Social History   Social History  . Marital status: Married    Spouse name: N/A  . Number of children: N/A  . Years of education: N/A   Social History Main Topics  . Smoking status: Former Smoker    Quit date: 10/31/1988  . Smokeless tobacco: Never Used  . Alcohol use 0.6 - 1.2 oz/week    1 - 2 Standard drinks or equivalent per week     Comment: occasional   . Drug use: No  . Sexual activity: Yes    Partners: Male   Other Topics Concern  . None   Social History Narrative   ** Merged History Encounter **       Working 2 jobs, 6d/wk      Regular exercise-no      Divorced   Allergies  Allergen Reactions  .  Indocin [Indomethacin] Swelling, Rash and Other (See Comments)    REACTION: lupus like illness; butterfly rash, pain and swelling all over body.   . Morphine Sulfate Nausea And Vomiting  . Sulfacetamide Sodium Swelling  . Caffeine Other (See Comments)    Induces A-fib  . Morphine And Related     nausea  . Sulfa Antibiotics Swelling  . Azithromycin Rash    ALL MYCIN DRUGS  . Erythromycin Stearate Hives and Swelling    REACTION: all mycins   Family History  Problem Relation Age of Onset  . Lung cancer Mother   . Thyroid cancer Mother   . Cancer Mother        INTESTINAL  . Healthy Father   . Diabetes Unknown        uncles/aunts  . Von Willebrand disease Daughter   . Asthma Neg Hx     Past medical history, social, surgical and family history all reviewed in electronic medical record.  No pertanent information unless stated regarding to the chief complaint.   Review of Systems: No , visual changes, nausea, vomiting, diarrhea, constipation,  dizziness, abdominal pain, skin rash, fevers, chills, night sweats, weight loss, swollen lymph nodes,, chest pain, shortness of breath, mood changes.  Positive muscle aches, body aches, headaches  Objective  Blood pressure 110/80, pulse 76, height 5\' 4"  (1.626 m), weight 167 lb (75.8 kg).   Systems examined below as of 09/07/16 General: NAD A&O x3 mood, affect normal  HEENT: Pupils equal, extraocular movements intact no nystagmus Respiratory: not short of breath at rest or with speaking Cardiovascular: No lower extremity edema, non tender Skin: Warm dry intact with no signs of infection or rash on extremities or on axial skeleton. Abdomen: Soft nontender, no masses Neuro: Cranial nerves  intact, neurovascularly intact in all extremities with 2+ DTRs and 2+ pulses. Lymph: No lymphadenopathy appreciated today  Gait normal with good balance and coordination.  MSK: Non tender with full range of motion and good stability and symmetric strength and tone of shoulders, elbows,  knee hips and ankles bilaterally.     Wrist: Right Inspection normal with no visible erythema or swelling. Patient still has pain on the dorsal aspect of the wrist. Seems to have a cystic formation still noted. ROM smooth and normal with good flexion and extension and ulnar/radial deviation that is symmetrical with opposite wrist. Tender over the dorsal aspect of the TFCC and diffusely over the dorsal aspect of the wrist No snuffbox tenderness. No tenderness over [place]. Strength 5/5 in all directions without pain. Negative Finkelstein, tinel's and phalens. Negative Watson's test. Contralateral wrist unremarkable  Foot exam shows the patient has had previous surgeries on the right foot. Severe breakdown of the transverse arch. Patient does have some overlapping of the second and third toes. Mild overpronation of the hindfoot.      Impression and Recommendations:     This case required medical decision  making of moderate complexity.      Note: This dictation was prepared with Dragon dictation along with smaller phrase technology. Any transcriptional errors that result from this process are unintentional.

## 2016-09-07 NOTE — Patient Instructions (Signed)
I am sorry for the hand still hurting.  Lets get you in with Dr. Amedeo Plenty see what he thinks.  Good shoes with rigid bottom.  Jalene Mullet, Merrell or New balance greater then 700 Avoid being barefoot.  Spenco orthotics "total support" online would be great  This can hel p the foot  Continue the vitamins Write me after seeing gramig and tell me the plan.

## 2016-09-07 NOTE — Therapy (Signed)
[facility] [address] [place], [place], [address] Phone: [phone]   Fax:  [phone]  Physical Therapy Treatment  Patient Details  Name: Regina [name] MRN: [id number] Date of Birth: March 01, 1966 Referring Provider: Dr. Lew Dawes  Encounter Date: 09/07/2016      PT End of Session - 09/07/16 1610    Visit Number [id number]   Date for PT Re-Evaluation 09/21/16   PT Start Time 1543  late   PT Stop Time 1625   PT Time Calculation (min) 42 min   Activity Tolerance Patient tolerated treatment well   Behavior During Therapy [facility] for tasks assessed/performed      Past Medical History:  Diagnosis Date  . Allergic urticaria   . ARTHRALGIA   . Arthritis   . Atrial fibrillation ([place])   . B12 DEFICIENCY   . CFS (chronic fatigue syndrome)   . Endometriosis   . Epidural hemorrhage with loss of consciousness ([place]) 06/29/15  . Fibroid   . Fibromyalgia   . Fracture of cervical vertebra, C6 ([place]) 06/29/15  . Gastritis   . GERD   . Heart murmur   . History of endometriosis   . IBS (irritable bowel syndrome)   . Internal hemorrhoids   . MVP (mitral valve prolapse)   . Osteoarthritis   . Rosacea   . Thoracic compression fracture ([place]) 06/29/15   T2-T6    Past Surgical History:  Procedure Laterality Date  . ABDOMINAL HYSTERECTOMY     partial  . ABDOMINAL HYSTERECTOMY    . ANAL FISSURE REPAIR     Dr Rise [name] 2009  . APPENDECTOMY    . COLPOSCOPY    . HEMORRHOID SURGERY     Dr. Rise [name] 2009  . OVARIAN CYST REMOVAL    . RT BUNIONECTOMT    . TONSILLECTOMY AND ADENOIDECTOMY    . TUBAL LIGATION    . UTERINE FIBROID SURGERY      There were no vitals filed for this visit.      Subjective Assessment - 09/07/16 1547    Subjective I will see a hand surgeon    Currently in Pain? Yes   Pain Score 2    Pain Location Neck   Pain Orientation Right;Left   Pain Descriptors / Indicators Sore   Pain Type Chronic  pain   Pain Onset More than a month ago   Pain Frequency Intermittent   Aggravating Factors  turning head to the Lt and Rt, sitting too long   Pain Relieving Factors heat, not turning head                         [facility] Adult PT Treatment/Exercise - 09/07/16 0001      Neck Exercises: Theraband   Horizontal ABduction Green;20 reps   Other Theraband Exercises D2, horizontal abduction and ER with green band 2x10     Neck Exercises: Standing   Other Standing Exercises UE ranger: flexion on Rt x 20     Neck Exercises: Seated   Shoulder Flexion Both;20 reps   Shoulder Flexion Weights (lbs) 1   Shoulder ABduction 20 reps;Both   Shoulder Abduction Weights (lbs) 1   Other Seated Exercise upper trap stretch, scalene stretch, pec stretch, nerve glides radial and median     Neck Exercises: Prone   Other Prone Exercise prone I, Y, T 2x10     Shoulder Exercises: Power Designer, multimedia  Limitations 20#   Row 20 reps   Row Limitations 25#     Moist Heat Therapy   Number Minutes Moist Heat 12 Minutes   Moist Heat Location Cervical                  PT Short Term Goals - 06/04/16 1711      PT SHORT TERM GOAL #1   Title independent with initial HEP   Status Achieved     PT SHORT TERM GOAL #2   Title headaches decreased in frequency by 25%   Status Achieved     PT SHORT TERM GOAL #3   Title ability to turn her head with pain decreased >/= 25% due to increase tissue mobility   Status Achieved           PT Long Term Goals - 09/07/16 1549      PT LONG TERM GOAL #1   Title independent with HEP and understand how to progress herself   Time 6   Period Weeks   Status On-going     PT LONG TERM GOAL #2   Title demonstrate cervical A/ROM rotation to > or = to 80 degrees to improve safety with driving   Time 6   Period Weeks   Status On-going     PT LONG TERM GOAL #3   Title turn neck with driving without increased pain   Baseline  just with turning left   Time 6   Period Weeks   Status On-going     PT LONG TERM GOAL #4   Title incresaed strength and decrease pain  to vacuum and mop floors in her house without needing to take a rest   Status Achieved     PT LONG TERM GOAL #7   Title report a 90% reduction in the frequency and intensity of neck pain with work and home tasks   Baseline 80%   Time 8   Period Weeks   Status On-going               Plan - 09/07/16 1550    Clinical Impression Statement Pt reports 80% overall improvement.  Pt with limited recent progress toward goals due to incresaed Rt UE pain.  Pt is being referred to a hand surgeon to discuss options.  NCV in Rt was normal.  Cervical A/ROM was 50 degrees bilaterally.  Pt with limited exercise due to arm pain.  Pt will continue to benefit from skilled PT for strength, endurance, flexibility and manual as needed.     Rehab Potential Good   Clinical Impairments Affecting Rehab Potential Fell on 06/29/2015 resulting with fractured skull, fractured T7, fractured shoulder, had 12 weeks of [facility] therapy   PT Frequency 2x / week   PT Duration 6 weeks   PT Treatment/Interventions Cryotherapy;Electrical Stimulation;Ultrasound;Traction;Moist Heat;Therapeutic activities;Therapeutic exercise;Neuromuscular re-education;Patient/family education;Passive range of motion;Manual techniques;Dry needling;Energy conservation;Taping   PT Next Visit Plan Continue strength for thoracic and UEs, cervcial A/ROM, manual and modalities.  Continue with dry needling as needed.     Consulted and Agree with Plan of Care Patient      Patient will benefit from skilled therapeutic intervention in order to improve the following deficits and impairments:  Decreased range of motion, Increased fascial restricitons, Decreased endurance, Increased muscle spasms, Pain, Hypomobility, Impaired flexibility, Decreased mobility, Decreased strength, Decreased activity  tolerance  Visit Diagnosis: Other muscle spasm  Cervicalgia  Pain in thoracic spine     Problem List Patient  Active Problem List   Diagnosis Date Noted  . Other bursal cyst of wrist 08/11/2016  . Posterior right knee pain 07/16/2016  . Knee pain, right 05/21/2016  . Hamstring strain, sequela 05/21/2016  . Wart viral 05/12/2016  . Contusion of knee 05/05/2016  . Fall involving sidewalk curb 05/05/2016  . Aphasia 03/13/2016  . Shoulder pain, bilateral 12/11/2015  . Fibromyalgia 12/11/2015  . Post concussion syndrome 11/01/2015  . Clavicle fracture 10/30/2015  . Hair loss 10/11/2015  . Pain in joint, shoulder region 09/11/2015  . Right wrist pain 08/07/2015  . Splinter in skin 07/30/2015  . Fall from ladder 07/30/2015  . Skull fracture with concussion ([place]) 07/30/2015  . Fracture of cervical vertebra, C6 ([place]) 07/30/2015  . Thoracic vertebral fracture ([place]) 07/30/2015  . Abdominal pain, epigastric 05/07/2015  . Scaly patch rash 02/26/2015  . Mitral valve regurgitation 09/27/2014  . PAT (paroxysmal atrial tachycardia) ([place]) 09/06/2014  . Tachycardia 07/31/2014  . Sprain of ankle 06/29/2014  . Earache, left 05/15/2013  . Dizziness 05/15/2013  . Asthmatic bronchitis 05/08/2013  . Benign paroxysmal positional vertigo 05/08/2013  . Chest wall pain 01/26/2013  . LUQ abdominal pain 01/20/2013  . Insomnia 01/10/2013  . Depression (emotion) 01/10/2013  . URI, acute 08/25/2012  . Snoring 08/25/2012  . IBS (irritable bowel syndrome) 02/08/2012  . Multiple pulmonary nodules 02/08/2012  . Chest heaviness 11/16/2010  . GERD 05/24/2010  . SINUSITIS, MAXILLARY, CHRONIC 05/20/2010  . Myalgia and myositis 11/20/2009  . Fatigue 11/20/2009  . ARTHRALGIA 06/26/2009  . Cervicogenic headache 06/26/2009  . TOBACCO USE, QUIT 06/26/2009  . LOW BACK PAIN 02/20/2009  . Rosacea 12/27/2008  . PRURITUS 12/27/2008  . Allergic urticaria 12/27/2008  . B12 deficiency 01/02/2008      Sigurd Sos, PT 09/07/16 4:18 PM  [facility] [address] [place], [place], [address] Phone: [phone]   Fax:  [phone]  Name: Regina [name] MRN: [id number] Date of Birth: 1965/08/19

## 2016-09-07 NOTE — Assessment & Plan Note (Signed)
No improvement. Has been treated multiple different ways patient has not responded completely in anyway. At this point patient states that she is having pain. Cystic formation still noted on the dorsal aspect of the wrist. Patient thinks that this is contributing to most of her pain at this point. EMG shows no significant nerve impingement and no cervical radiculopathy. We discussed slowly. Also would be to continue formal physical therapy. Patient was referred for a hand specialist today.

## 2016-09-07 NOTE — Assessment & Plan Note (Signed)
Transverse arch noted. Breakdown there. We discussed with patient at great length. Patient will try over-the-counter orthotics and proper shoes. We discussed home exercises. Patient was given a handout. Follow-up in 4-6 weeks.

## 2016-09-09 ENCOUNTER — Ambulatory Visit: Payer: BC Managed Care – PPO

## 2016-09-09 NOTE — Progress Notes (Signed)
[facility] [employer] PRE[facility] Weekly Session   Patient Details  Name: Regina [name] MRN: [id number] Date of Birth: 1965-07-18 Age: 51 y.o. PCP: Cassandria Anger, MD  Vitals:   09/07/16 0916  Weight: 166 lb 6.4 oz (75.5 kg)        [facility] [employer] Weekly seesion - 09/09/16 0900      Weekly Session   Topic Discussed Other  Guest speaker   Minutes exercised this week --  not reported   Classes attended to date 6     Things you are grateful for:"too many to list!!" Nutrition celebrations for the week:"none-still eating!!" Barriers:"ME"  Vanita Ingles 09/09/2016, 9:16 AM

## 2016-09-14 ENCOUNTER — Ambulatory Visit: Payer: BC Managed Care – PPO | Admitting: Physical Therapy

## 2016-09-14 DIAGNOSIS — M6281 Muscle weakness (generalized): Secondary | ICD-10-CM

## 2016-09-14 DIAGNOSIS — M546 Pain in thoracic spine: Secondary | ICD-10-CM

## 2016-09-14 DIAGNOSIS — M62838 Other muscle spasm: Secondary | ICD-10-CM | POA: Diagnosis not present

## 2016-09-14 DIAGNOSIS — M542 Cervicalgia: Secondary | ICD-10-CM

## 2016-09-14 NOTE — Therapy (Addendum)
[facility] [address] [place], [place], [address] Phone: [phone]   Fax:  [phone]  Physical Therapy Treatment  Patient Details  Name: Regina [name] MRN: [id number] Date of Birth: 1965-03-31 Referring Provider: Dr. Lew Dawes  Encounter Date: 09/14/2016      PT End of Session - 09/14/16 1544    Visit Number 35   Date for PT Re-Evaluation 09/21/16   PT Start Time 1542  arrived late, dry needle, hot pack   PT Stop Time 1624   PT Time Calculation (min) 42 min   Activity Tolerance Patient tolerated treatment well   Behavior During Therapy [facility] for tasks assessed/performed      Past Medical History:  Diagnosis Date  . Allergic urticaria   . ARTHRALGIA   . Arthritis   . Atrial fibrillation ([place])   . B12 DEFICIENCY   . CFS (chronic fatigue syndrome)   . Endometriosis   . Epidural hemorrhage with loss of consciousness ([place]) 06/29/15  . Fibroid   . Fibromyalgia   . Fracture of cervical vertebra, C6 ([place]) 06/29/15  . Gastritis   . GERD   . Heart murmur   . History of endometriosis   . IBS (irritable bowel syndrome)   . Internal hemorrhoids   . MVP (mitral valve prolapse)   . Osteoarthritis   . Rosacea   . Thoracic compression fracture ([place]) 06/29/15   T2-T6    Past Surgical History:  Procedure Laterality Date  . ABDOMINAL HYSTERECTOMY     partial  . ABDOMINAL HYSTERECTOMY    . ANAL FISSURE REPAIR     Dr Rise [name] 2009  . APPENDECTOMY    . COLPOSCOPY    . HEMORRHOID SURGERY     Dr. Rise [name] 2009  . OVARIAN CYST REMOVAL    . RT BUNIONECTOMT    . TONSILLECTOMY AND ADENOIDECTOMY    . TUBAL LIGATION    . UTERINE FIBROID SURGERY      There were no vitals filed for this visit.      Subjective Assessment - 09/14/16 1621    Subjective Reports posture feels worse.   Pertinent History fibromyalgia   Patient Stated Goals reduce pain, turn neck instead of body   Currently in Pain? Yes   Pain Score 2    Pain Location Neck   Pain Orientation Right;Left   Pain Descriptors / Indicators Sore   Pain Type Chronic pain   Pain Onset More than a month ago   Pain Frequency Intermittent   Multiple Pain Sites No                         [facility] Adult PT Treatment/Exercise - 09/14/16 0001      Neck Exercises: Theraband   Horizontal ABduction Green;20 reps   Other Theraband Exercises D2, horizontal abduction and ER with green band 2x10     Moist Heat Therapy   Number Minutes Moist Heat 10 Minutes   Moist Heat Location Cervical     Manual Therapy   Manual Therapy Soft tissue mobilization   Manual therapy comments prone   Soft tissue mobilization Suboccipitals, paraspinals, and cervical muscles          Trigger Point Dry Needling - 09/14/16 1616    Consent Given? Yes   Upper Trapezius Response Twitch reponse elicited;Palpable increased muscle length   Rhomboids Response Twitch response elicited;Palpable increased muscle length  thoracic multifidy T1-3  PT Short Term Goals - 06/04/16 1711      PT SHORT TERM GOAL #1   Title independent with initial HEP   Status Achieved     PT SHORT TERM GOAL #2   Title headaches decreased in frequency by 25%   Status Achieved     PT SHORT TERM GOAL #3   Title ability to turn her head with pain decreased >/= 25% due to increase tissue mobility   Status Achieved           PT Long Term Goals - 09/14/16 1554      PT LONG TERM GOAL #2   Title demonstrate cervical A/ROM rotation to > or = to 80 degrees to improve safety with driving   Baseline 50 degrees   Time 6   Period Weeks   Status On-going     PT LONG TERM GOAL #3   Title turn neck with driving without increased pain   Baseline just with turning left   Time 6   Period Weeks   Status On-going               Plan - 09/14/16 1543    Clinical Impression Statement Patient demonstrates rounded shoulders and increased  kyphosis in sitting.  She reprots it has been feeling more difficult to correct her posture.  She responded well to pec stretches.  Pt respondes well to dry needling with increawsed muscle length.  Cervical AROM is the same as previous session  50 degrees.  Pt will need skilled PT for improved postural strength and endurance.     Clinical Impairments Affecting Rehab Potential Fell on 06/29/2015 resulting with fractured skull, fractured T7, fractured shoulder, had 12 weeks of [facility] therapy   PT Treatment/Interventions Cryotherapy;Electrical Stimulation;Ultrasound;Traction;Moist Heat;Therapeutic activities;Therapeutic exercise;Neuromuscular re-education;Patient/family education;Passive range of motion;Manual techniques;Dry needling;Energy conservation;Taping   PT Next Visit Plan assess response to dry needling, Continue strength for thoracic and UEs, cervcial A/ROM, manual and modalities.  Continue with dry needling as needed.     PT Home Exercise Plan cervical stabilization exercises   Consulted and Agree with Plan of Care Patient      Patient will benefit from skilled therapeutic intervention in order to improve the following deficits and impairments:  Decreased range of motion, Increased fascial restricitons, Decreased endurance, Increased muscle spasms, Pain, Hypomobility, Impaired flexibility, Decreased mobility, Decreased strength, Decreased activity tolerance  Visit Diagnosis: Other muscle spasm  Cervicalgia  Pain in thoracic spine  Muscle weakness (generalized)     Problem List Patient Active Problem List   Diagnosis Date Noted  . Loss of transverse plantar arch of right foot 09/07/2016  . Other bursal cyst of wrist 08/11/2016  . Posterior right knee pain 07/16/2016  . Knee pain, right 05/21/2016  . Hamstring strain, sequela 05/21/2016  . Wart viral 05/12/2016  . Contusion of knee 05/05/2016  . Fall involving sidewalk curb 05/05/2016  . Aphasia 03/13/2016  .  Shoulder pain, bilateral 12/11/2015  . Fibromyalgia 12/11/2015  . Post concussion syndrome 11/01/2015  . Clavicle fracture 10/30/2015  . Hair loss 10/11/2015  . Pain in joint, shoulder region 09/11/2015  . Right wrist pain 08/07/2015  . Splinter in skin 07/30/2015  . Fall from ladder 07/30/2015  . Skull fracture with concussion ([place]) 07/30/2015  . Fracture of cervical vertebra, C6 ([place]) 07/30/2015  . Thoracic vertebral fracture ([place]) 07/30/2015  . Abdominal pain, epigastric 05/07/2015  . Scaly patch rash 02/26/2015  . Mitral valve regurgitation 09/27/2014  . PAT (  paroxysmal atrial tachycardia) ([place]) 09/06/2014  . Tachycardia 07/31/2014  . Sprain of ankle 06/29/2014  . Earache, left 05/15/2013  . Dizziness 05/15/2013  . Asthmatic bronchitis 05/08/2013  . Benign paroxysmal positional vertigo 05/08/2013  . Chest wall pain 01/26/2013  . LUQ abdominal pain 01/20/2013  . Insomnia 01/10/2013  . Depression (emotion) 01/10/2013  . URI, acute 08/25/2012  . Snoring 08/25/2012  . IBS (irritable bowel syndrome) 02/08/2012  . Multiple pulmonary nodules 02/08/2012  . Chest heaviness 11/16/2010  . GERD 05/24/2010  . SINUSITIS, MAXILLARY, CHRONIC 05/20/2010  . Myalgia and myositis 11/20/2009  . Fatigue 11/20/2009  . ARTHRALGIA 06/26/2009  . Cervicogenic headache 06/26/2009  . TOBACCO USE, QUIT 06/26/2009  . LOW BACK PAIN 02/20/2009  . Rosacea 12/27/2008  . PRURITUS 12/27/2008  . Allergic urticaria 12/27/2008  . B12 deficiency 01/02/2008    Zannie Cove, PT 09/14/2016, 4:22 PM  [facility] [address] [place], [place], [address] Phone: [phone]   Fax:  [phone]  Name: Regina [name] MRN: [id number] Date of Birth: [birth year]  PHYSICAL THERAPY DISCHARGE SUMMARY  Visits from Start of Care: 35  Current functional level related to goals / functional outcomes: See above most recent update   Remaining  deficits: See above notes   Education / Equipment: HEP  Plan: Patient agrees to discharge.  Patient goals were partially met. Patient is being discharged due to not returning since the last visit.  ?????   Google, PT 10/15/16 9:08 AM

## 2016-09-21 ENCOUNTER — Ambulatory Visit: Payer: BC Managed Care – PPO

## 2016-10-19 ENCOUNTER — Ambulatory Visit: Payer: BC Managed Care – PPO | Admitting: Internal Medicine

## 2016-10-19 ENCOUNTER — Ambulatory Visit: Payer: BC Managed Care – PPO | Admitting: Neurology

## 2016-10-20 ENCOUNTER — Encounter: Payer: Self-pay | Admitting: Internal Medicine

## 2016-10-20 ENCOUNTER — Ambulatory Visit (INDEPENDENT_AMBULATORY_CARE_PROVIDER_SITE_OTHER): Payer: BC Managed Care – PPO | Admitting: Internal Medicine

## 2016-10-20 DIAGNOSIS — S22068D Other fracture of T7-T8 thoracic vertebra, subsequent encounter for fracture with routine healing: Secondary | ICD-10-CM | POA: Diagnosis not present

## 2016-10-20 DIAGNOSIS — M5441 Lumbago with sciatica, right side: Secondary | ICD-10-CM | POA: Diagnosis not present

## 2016-10-20 DIAGNOSIS — S12591D Other nondisplaced fracture of sixth cervical vertebra, subsequent encounter for fracture with routine healing: Secondary | ICD-10-CM

## 2016-10-20 DIAGNOSIS — S060X9S Concussion with loss of consciousness of unspecified duration, sequela: Secondary | ICD-10-CM

## 2016-10-20 DIAGNOSIS — N951 Menopausal and female climacteric states: Secondary | ICD-10-CM | POA: Insufficient documentation

## 2016-10-20 DIAGNOSIS — E538 Deficiency of other specified B group vitamins: Secondary | ICD-10-CM | POA: Diagnosis not present

## 2016-10-20 DIAGNOSIS — G8929 Other chronic pain: Secondary | ICD-10-CM | POA: Diagnosis not present

## 2016-10-20 DIAGNOSIS — R000 Tachycardia, unspecified: Secondary | ICD-10-CM

## 2016-10-20 DIAGNOSIS — S0291XS Unspecified fracture of skull, sequela: Secondary | ICD-10-CM | POA: Diagnosis not present

## 2016-10-20 DIAGNOSIS — M25531 Pain in right wrist: Secondary | ICD-10-CM

## 2016-10-20 DIAGNOSIS — S060XAS Concussion with loss of consciousness status unknown, sequela: Secondary | ICD-10-CM

## 2016-10-20 DIAGNOSIS — F410 Panic disorder [episodic paroxysmal anxiety] without agoraphobia: Secondary | ICD-10-CM

## 2016-10-20 NOTE — Assessment & Plan Note (Signed)
Recovering  Finished PT

## 2016-10-20 NOTE — Assessment & Plan Note (Signed)
Start PT again

## 2016-10-20 NOTE — Assessment & Plan Note (Signed)
On B12 

## 2016-10-20 NOTE — Assessment & Plan Note (Signed)
Pulse Readings from Last 3 Encounters:  10/20/16 (!) 58  09/07/16 76  08/11/16 70

## 2016-10-20 NOTE — Progress Notes (Signed)
Subjective:  Patient ID: Regina [name], female    DOB: 1965-10-09  Age: 51 y.o. MRN: [id number]  CC: No chief complaint on file.   HPI BLIA TOTMAN presents for neck pain, LBP, HAs, anxiety f/u. C/o R LPP in the sacrum area (SI)  Outpatient Medications Prior to Visit  Medication Sig Dispense Refill  . albuterol (PROAIR HFA) 108 (90 Base) MCG/ACT inhaler Inhale 2 puffs into the lungs every 6 (six) hours as needed for wheezing or shortness of breath (before exercising). 1 Inhaler 6  . CASCARA SAGRADA PO Take by mouth.    . Cholecalciferol (GNP VITAMIN D) 1000 units tablet Take 2 tablets (2,000 Units total) by mouth daily. 100 tablet 3  . cyanocobalamin (,VITAMIN B-12,) 1000 MCG/ML injection Inject 1 mL (1,000 mcg total) into the skin every 14 (fourteen) days. 10 mL 11  . ergocalciferol (VITAMIN D2) 50000 units capsule Take 1 capsule (50,000 Units total) by mouth once a week. 6 capsule 0  . hydrocortisone 2.5 % cream Apply topically 2 (two) times daily. 40 g 1  . Phosphatidylserine-DHA-EPA (VAYACOG) 100-19.5-6.5 MG CAPS Take 1 capsule by mouth daily. 30 capsule 11  . SYRINGE-NEEDLE, DISP, 3 ML (BD ECLIPSE SYRINGE) 25G X 1" 3 ML MISC Use IM 50 each 11  . Turmeric 500 MG TABS Take by mouth 2 (two) times daily.     No facility-administered medications prior to visit.     ROS Review of Systems  Constitutional: Positive for fatigue. Negative for activity change, appetite change, chills and unexpected weight change.  HENT: Negative for congestion, mouth sores and sinus pressure.   Eyes: Negative for visual disturbance.  Respiratory: Negative for cough and chest tightness.   Gastrointestinal: Negative for abdominal pain and nausea.  Genitourinary: Negative for difficulty urinating, frequency and vaginal pain.  Musculoskeletal: Positive for arthralgias, back pain, neck pain and neck stiffness. Negative for gait problem.  Skin: Negative for pallor and rash.  Neurological: Positive for  dizziness and headaches. Negative for tremors, weakness and numbness.  Psychiatric/Behavioral: Positive for decreased concentration. Negative for confusion, dysphoric mood, sleep disturbance and suicidal ideas.    Objective:  BP 120/72 (BP Location: Left Arm, Patient Position: Sitting, Cuff Size: Normal)   Pulse (!) 58   Temp 98.7 F (37.1 C) (Oral)   Ht 5\' 4"  (1.626 m)   Wt 162 lb (73.5 kg)   SpO2 98%   BMI 27.81 kg/m   BP Readings from Last 3 Encounters:  10/20/16 120/72  09/07/16 110/80  08/11/16 122/82    Wt Readings from Last 3 Encounters:  10/20/16 162 lb (73.5 kg)  09/07/16 166 lb 6.4 oz (75.5 kg)  09/07/16 167 lb (75.8 kg)    Physical Exam  Constitutional: She appears well-developed. No distress.  HENT:  Head: Normocephalic.  Right Ear: External ear normal.  Left Ear: External ear normal.  Nose: Nose normal.  Mouth/Throat: Oropharynx is clear and moist.  Eyes: Pupils are equal, round, and reactive to light. Conjunctivae are normal. Right eye exhibits no discharge. Left eye exhibits no discharge.  Neck: Normal range of motion. Neck supple. No JVD present. No tracheal deviation present. No thyromegaly present.  Cardiovascular: Normal rate, regular rhythm and normal heart sounds.   Pulmonary/Chest: No stridor. No respiratory distress. She has no wheezes.  Abdominal: Soft. Bowel sounds are normal. She exhibits no distension and no mass. There is no tenderness. There is no rebound and no guarding.  Musculoskeletal: She exhibits tenderness. She exhibits no  edema.  Lymphadenopathy:    She has no cervical adenopathy.  Neurological: She displays normal reflexes. No cranial nerve deficit. She exhibits normal muscle tone. Coordination normal.  Skin: No rash noted. No erythema.  Psychiatric: She has a normal mood and affect. Her behavior is normal. Judgment and thought content normal.  neck is tender w/ROM R wrist is tender dorsally, R shoulder is tender R str leg elev  (-/+) R buttock and R troch major and R SI - tender  Lab Results  Component Value Date   WBC 6.8 07/20/2016   HGB 12.6 07/20/2016   HCT 37.2 07/20/2016   PLT 305 07/20/2016   GLUCOSE 92 07/20/2016   CHOL 201 (H) 02/26/2015   TRIG 65.0 02/26/2015   HDL 62.10 02/26/2015   LDLCALC 126 (H) 02/26/2015   ALT 18 09/11/2015   AST 19 09/11/2015   NA 136 07/20/2016   K 3.6 07/20/2016   CL 104 07/20/2016   CREATININE 0.68 07/20/2016   BUN 11 07/20/2016   CO2 27 07/20/2016   TSH 1.19 09/11/2015   INR 1.16 06/29/2015    Dg Chest 2 View  Result Date: 07/20/2016 CLINICAL DATA:  Shortness of Breath EXAM: CHEST  2 VIEW COMPARISON:  Chest radiograph June 29, 2015; chest CT June 29, 2015 FINDINGS: There is no edema or consolidation. The heart size and pulmonary vascularity are normal. No adenopathy. No bone lesions. IMPRESSION: No edema or consolidation. Electronically Signed   By: Lowella Grip III M.D.   On: 07/20/2016 12:37   Ct Head Wo Contrast  Result Date: 07/20/2016 CLINICAL DATA:  Left upper extremity weakness and paresthesia EXAM: CT HEAD WITHOUT CONTRAST TECHNIQUE: Contiguous axial images were obtained from the base of the skull through the vertex without intravenous contrast. COMPARISON:  June 29, 2015 FINDINGS: Brain: There has been interval resolution of epidural hematoma on the right at the frontoparietal junction compared to the previous study. The ventricles are normal in size and configuration. There is borderline frontal atrophy bilaterally. Currently there is no intracranial mass, hemorrhage, extra-axial fluid collection, or midline shift. Gray-white compartments appear unremarkable. No acute infarct is demonstrable. Vascular: There is no hyperdense vessel. There is no appreciable vascular calcification. Skull: Bony calvarium appears intact. Sinuses/Orbits: Visualized paranasal sinuses are clear. Visualized orbits appear symmetric bilaterally. Other: Visualized mastoid air cells  are clear. IMPRESSION: Slight frontal atrophy bilaterally. Ventricles normal in size and configuration. No intracranial mass, hemorrhage, or extra-axial fluid collection. Gray-white compartments normal. Note that the previous right frontal-parietal epidural hematoma has resolved. Electronically Signed   By: Lowella Grip III M.D.   On: 07/20/2016 12:40    Assessment & Plan:   There are no diagnoses linked to this encounter. I am having Ms. Pfalzgraf maintain her SYRINGE-NEEDLE (DISP) 3 ML, cyanocobalamin, CASCARA SAGRADA PO, Phosphatidylserine-DHA-EPA, Cholecalciferol, Turmeric, hydrocortisone, ergocalciferol, and albuterol.  No orders of the defined types were placed in this encounter.    Follow-up: No Follow-up on file.  Walker Kehr, MD

## 2016-10-20 NOTE — Assessment & Plan Note (Signed)
Valerian root prn 

## 2016-10-20 NOTE — Assessment & Plan Note (Addendum)
Stretching exercises Worse. Re-start PT

## 2016-10-20 NOTE — Assessment & Plan Note (Signed)
F/u w/Dr Smith 

## 2016-10-20 NOTE — Patient Instructions (Signed)
Valerian root for anxiety 

## 2016-10-20 NOTE — Assessment & Plan Note (Signed)
Black cohosh

## 2016-12-01 DIAGNOSIS — T161XXA Foreign body in right ear, initial encounter: Secondary | ICD-10-CM | POA: Insufficient documentation

## 2017-01-04 ENCOUNTER — Ambulatory Visit: Payer: BC Managed Care – PPO | Admitting: Family Medicine

## 2017-03-09 ENCOUNTER — Ambulatory Visit: Payer: BC Managed Care – PPO | Admitting: Internal Medicine

## 2017-03-10 ENCOUNTER — Encounter: Payer: Self-pay | Admitting: Family

## 2017-03-10 ENCOUNTER — Ambulatory Visit: Payer: BC Managed Care – PPO | Admitting: Family

## 2017-03-10 ENCOUNTER — Other Ambulatory Visit (INDEPENDENT_AMBULATORY_CARE_PROVIDER_SITE_OTHER): Payer: BC Managed Care – PPO

## 2017-03-10 VITALS — BP 100/64 | HR 72 | Temp 98.1°F | Ht 64.0 in | Wt 175.0 lb

## 2017-03-10 DIAGNOSIS — R209 Unspecified disturbances of skin sensation: Secondary | ICD-10-CM | POA: Diagnosis not present

## 2017-03-10 DIAGNOSIS — E538 Deficiency of other specified B group vitamins: Secondary | ICD-10-CM | POA: Diagnosis not present

## 2017-03-10 DIAGNOSIS — R238 Other skin changes: Secondary | ICD-10-CM

## 2017-03-10 DIAGNOSIS — E041 Nontoxic single thyroid nodule: Secondary | ICD-10-CM | POA: Diagnosis not present

## 2017-03-10 DIAGNOSIS — R635 Abnormal weight gain: Secondary | ICD-10-CM

## 2017-03-10 LAB — VITAMIN B12: Vitamin B-12: 224 pg/mL (ref 211–911)

## 2017-03-10 LAB — COMPREHENSIVE METABOLIC PANEL
ALBUMIN: 4.4 g/dL (ref 3.5–5.2)
ALT: 10 U/L (ref 0–35)
AST: 14 U/L (ref 0–37)
Alkaline Phosphatase: 64 U/L (ref 39–117)
BUN: 10 mg/dL (ref 6–23)
CHLORIDE: 102 meq/L (ref 96–112)
CO2: 27 meq/L (ref 19–32)
CREATININE: 0.87 mg/dL (ref 0.40–1.20)
Calcium: 9.2 mg/dL (ref 8.4–10.5)
GFR: 72.7 mL/min (ref >60.00)
GLUCOSE: 88 mg/dL (ref 70–99)
Potassium: 3.8 mEq/L (ref 3.5–5.1)
SODIUM: 138 meq/L (ref 135–145)
Total Bilirubin: 0.3 mg/dL (ref 0.2–1.2)
Total Protein: 7.5 g/dL (ref 6.0–8.3)

## 2017-03-10 LAB — TSH: TSH: 1.09 u[IU]/mL (ref 0.35–4.50)

## 2017-03-10 LAB — MAGNESIUM: MAGNESIUM: 2.1 mg/dL (ref 1.5–2.5)

## 2017-03-10 MED ORDER — FLUOCINOLONE ACETONIDE BODY 0.01 % EX OIL: 1.0000 "application " | TOPICAL_OIL | Freq: Two times a day (BID) | CUTANEOUS | 0 refills | Status: DC

## 2017-03-11 NOTE — Progress Notes (Signed)
Regina [name] is a 51 y.o. female with the following history as recorded in EpicCare:  Patient Active Problem List   Diagnosis Date Noted  . Sweats, menopausal 10/20/2016  . Panic attacks 10/20/2016  . Loss of transverse plantar arch of right foot 09/07/2016  . Other bursal cyst of wrist 08/11/2016  . Posterior right knee pain 07/16/2016  . Knee pain, right 05/21/2016  . Hamstring strain, sequela 05/21/2016  . Wart viral 05/12/2016  . Contusion of knee 05/05/2016  . Fall involving sidewalk curb 05/05/2016  . Aphasia 03/13/2016  . Shoulder pain, bilateral 12/11/2015  . Fibromyalgia 12/11/2015  . Post concussion syndrome 11/01/2015  . Clavicle fracture 10/30/2015  . Hair loss 10/11/2015  . Pain in joint, shoulder region 09/11/2015  . Right wrist pain 08/07/2015  . Splinter in skin 07/30/2015  . Fall from ladder 07/30/2015  . Skull fracture with concussion ([place]) 07/30/2015  . Fracture of cervical vertebra, C6 ([place]) 07/30/2015  . Thoracic vertebral fracture ([place]) 07/30/2015  . Abdominal pain, epigastric 05/07/2015  . Scaly patch rash 02/26/2015  . Mitral valve regurgitation 09/27/2014  . PAT (paroxysmal atrial tachycardia) ([place]) 09/06/2014  . Tachycardia 07/31/2014  . Sprain of ankle 06/29/2014  . Earache, left 05/15/2013  . Dizziness 05/15/2013  . Asthmatic bronchitis 05/08/2013  . Benign paroxysmal positional vertigo 05/08/2013  . Chest wall pain 01/26/2013  . LUQ abdominal pain 01/20/2013  . Insomnia 01/10/2013  . Depression (emotion) 01/10/2013  . URI, acute 08/25/2012  . Snoring 08/25/2012  . IBS (irritable bowel syndrome) 02/08/2012  . Multiple pulmonary nodules 02/08/2012  . Chest heaviness 11/16/2010  . GERD 05/24/2010  . SINUSITIS, MAXILLARY, CHRONIC 05/20/2010  . Myalgia and myositis 11/20/2009  . Fatigue 11/20/2009  . ARTHRALGIA 06/26/2009  . Cervicogenic headache 06/26/2009  . TOBACCO USE, QUIT 06/26/2009  . LOW BACK PAIN 02/20/2009  . Rosacea  12/27/2008  . PRURITUS 12/27/2008  . Allergic urticaria 12/27/2008  . B12 deficiency 01/02/2008    Current Outpatient Medications  Medication Sig Dispense Refill  . CASCARA SAGRADA PO Take by mouth.    . Cholecalciferol (GNP VITAMIN D) 1000 units tablet Take 2 tablets (2,000 Units total) by mouth daily. 100 tablet 3  . cyanocobalamin (,VITAMIN B-12,) 1000 MCG/ML injection Inject 1 mL (1,000 mcg total) into the skin every 14 (fourteen) days. 10 mL 11  . SYRINGE-NEEDLE, DISP, 3 ML (BD ECLIPSE SYRINGE) 25G X 1" 3 ML MISC Use IM 50 each 11  . Turmeric 500 MG TABS Take by mouth 2 (two) times daily.    Marland Kitchen albuterol (PROAIR HFA) 108 (90 Base) MCG/ACT inhaler Inhale 2 puffs into the lungs every 6 (six) hours as needed for wheezing or shortness of breath (before exercising). (Patient not taking: Reported on 03/10/2017) 1 Inhaler 6  . ergocalciferol (VITAMIN D2) 50000 units capsule Take 1 capsule (50,000 Units total) by mouth once a week. (Patient not taking: Reported on 03/10/2017) 6 capsule 0  . Fluocinolone Acetonide Body (DERMA-SMOOTHE/FS BODY) 0.01 % OIL Apply 1 application topically 2 (two) times daily. 118 mL 0  . hydrocortisone 2.5 % cream Apply topically 2 (two) times daily. (Patient not taking: Reported on 03/10/2017) 40 g 1  . Phosphatidylserine-DHA-EPA (VAYACOG) 100-19.5-6.5 MG CAPS Take 1 capsule by mouth daily. (Patient not taking: Reported on 03/10/2017) 30 capsule 11   No current facility-administered medications for this visit.     Allergies: Indocin [indomethacin]; Morphine sulfate; Sulfacetamide sodium; Caffeine; Morphine and related; Sulfa antibiotics; Azithromycin; and Erythromycin stearate  Past Medical History:  Diagnosis Date  . Allergic urticaria   . ARTHRALGIA   . Arthritis   . Atrial fibrillation ([place])   . B12 DEFICIENCY   . CFS (chronic fatigue syndrome)   . Endometriosis   . Epidural hemorrhage with loss of consciousness ([place]) 06/29/15  . Fibroid   . Fibromyalgia   .  Fracture of cervical vertebra, C6 ([place]) 06/29/15  . Gastritis   . GERD   . Heart murmur   . History of endometriosis   . IBS (irritable bowel syndrome)   . Internal hemorrhoids   . MVP (mitral valve prolapse)   . Osteoarthritis   . Rosacea   . Thoracic compression fracture ([place]) 06/29/15   T2-T6    Past Surgical History:  Procedure Laterality Date  . ABDOMINAL HYSTERECTOMY     partial  . ABDOMINAL HYSTERECTOMY    . ANAL FISSURE REPAIR     Dr Rise [name] 2009  . APPENDECTOMY    . COLPOSCOPY    . HEMORRHOID SURGERY     Dr. Rise [name] 2009  . OVARIAN CYST REMOVAL    . RT BUNIONECTOMT    . TONSILLECTOMY AND ADENOIDECTOMY    . TUBAL LIGATION    . UTERINE FIBROID SURGERY      Family History  Problem Relation Age of Onset  . Lung cancer Mother   . Thyroid cancer Mother   . Cancer Mother        INTESTINAL  . Healthy Father   . Diabetes Unknown        uncles/aunts  . Von Willebrand disease Daughter   . Asthma Neg Hx     Social History   Tobacco Use  . Smoking status: Former Smoker    Last attempt to quit: 10/31/1988    Years since quitting: 28.3  . Smokeless tobacco: Never Used  Substance Use Topics  . Alcohol use: Yes    Alcohol/week: 0.6 - 1.2 oz    Types: 1 - 2 Standard drinks or equivalent per week    Comment: occasional     Subjective:  Patient seen with numerous concerns today:  1) 2 month history of "sensation of inflammation" on the left side of her neck; denies any difficulty swallowing but notes that something "just feels off." Does have history of thyroid nodules; last ultrasound was done in 2017 prior to serious fall in April 2017; FH of thyroid cancer in mother as well; + hot flashes and weight gain; feels these symptoms are different than "normal" menopause. 2) also wonders about her B12 level- notes that her calves will feel "cold" internally but are "normal" to the touch.  3) Increased sinus drainage; admits that she does not like to use medications; prefers  natural remedies.  4) concerned about "dry patches" on scalp- has been evaluated by dermatology; does not like the topical cream she was given.   Objective:  Vitals:   03/10/17 1358  BP: 100/64  Pulse: 72  Temp: 98.1 F (36.7 C)  TempSrc: Oral  SpO2: 99%  Weight: 175 lb (79.4 kg)  Height: 5\' 4"  (1.626 m)    General: Well developed, well nourished, in no acute distress  Skin : Warm and dry.  Head: Normocephalic and atraumatic  Eyes: Sclera and conjunctiva clear; pupils round and reactive to light; extraocular movements intact  Ears: External normal; canals clear; tympanic membranes normal  Oropharynx: Pink, supple. No suspicious lesions  Neck: Supple without thyromegaly, left sidedadenopathy  Lungs: Respirations unlabored; clear to auscultation  bilaterally without wheeze, rales, rhonchi  CVS exam: normal rate and regular rhythm.  Abdomen: Soft; nontender; nondistended; normoactive bowel sounds; no masses or hepatosplenomegaly  Musculoskeletal: No deformities; no active joint inflammation  Extremities: No edema, cyanosis, clubbing  Vessels: Symmetric bilaterally  Neurologic: Alert and oriented; speech intact; face symmetrical; moves all extremities well; CNII-XII intact without focal deficit   Assessment:  1. Weight gain   2. Thyroid nodule   3. Vitamin B12 deficiency   4. Cold sensation of skin   5. Dry scalp     Plan:  ? If all symptoms discussed could be related to underlying thyroid issue; she is overdue for thyroid ultrasound and this order is updated; will check CMP, B12, TSH and magnesium today as well; follow-up to be determined.  Regarding sinus drainage, discussed using Flonase but patient defers at this time; she will call back if she wants prescription.  Rx for DermaSoothe oil to apply to scalp; follow-up with dermatologist if symptoms persist.   Spent 30+ minutes with patient today.   No Follow-up on file.  Orders Placed This Encounter  Procedures  . US  Soft Tissue Head/Neck    Patient wants to go to [facility] on [address];    Standing Status:   Future    Standing Expiration Date:   05/11/2018    Order Specific Question:   Reason for Exam (SYMPTOM  OR DIAGNOSIS REQUIRED)    Answer:   neck pain; enlarged lymph nodes; history of thyroid nodules    Order Specific Question:   Preferred imaging location?    Answer:   External  . Comprehensive metabolic panel    Standing Status:   Future    Number of Occurrences:   1    Standing Expiration Date:   03/10/2018  . TSH    Standing Status:   Future    Number of Occurrences:   1    Standing Expiration Date:   03/10/2018  . B12    Standing Status:   Future    Number of Occurrences:   1    Standing Expiration Date:   03/10/2018  . Magnesium    Standing Status:   Future    Number of Occurrences:   1    Standing Expiration Date:   03/10/2018    Requested Prescriptions   Signed Prescriptions Disp Refills  . Fluocinolone Acetonide Body (DERMA-SMOOTHE/FS BODY) 0.01 % OIL 118 mL 0    Sig: Apply 1 application topically 2 (two) times daily.

## 2017-03-12 ENCOUNTER — Other Ambulatory Visit: Payer: Self-pay | Admitting: Family

## 2017-03-12 DIAGNOSIS — Z8639 Personal history of other endocrine, nutritional and metabolic disease: Secondary | ICD-10-CM

## 2017-03-12 DIAGNOSIS — R590 Localized enlarged lymph nodes: Secondary | ICD-10-CM

## 2017-03-17 ENCOUNTER — Telehealth: Payer: Self-pay | Admitting: Family

## 2017-03-17 NOTE — Telephone Encounter (Signed)
Thyroid ultrasound did show small thyroid nodules but not of a size that would be considered clinically significant. If she is continuing to have the sensation in her neck, I would recommend follow-up with ENT. Let me know what she prefers.

## 2017-03-18 ENCOUNTER — Ambulatory Visit (INDEPENDENT_AMBULATORY_CARE_PROVIDER_SITE_OTHER): Payer: BC Managed Care – PPO | Admitting: *Deleted

## 2017-03-18 DIAGNOSIS — E538 Deficiency of other specified B group vitamins: Secondary | ICD-10-CM

## 2017-03-18 MED ORDER — CYANOCOBALAMIN 1000 MCG/ML IJ SOLN
1000.0000 ug | Freq: Once | INTRAMUSCULAR | Status: AC
  Administered 2017-03-18: 1000 ug via INTRAMUSCULAR

## 2017-03-18 NOTE — Progress Notes (Signed)
Dr. Alain Marion, NP Mickel Baas ordered a B12 for your patient, and since she is not here I have to send the encounter to you to signed off...Regina [name]

## 2017-03-26 NOTE — Telephone Encounter (Signed)
Left voicemail advising patient of laura's note/instructions

## 2017-06-15 IMAGING — CR DG PORTABLE PELVIS
1 series · 1 of 1 positions shown · non-contrast
Comparison: None.

CLINICAL DATA: Fall from roof.

EXAM:
PORTABLE PELVIS 1-2 VIEWS

[AP]
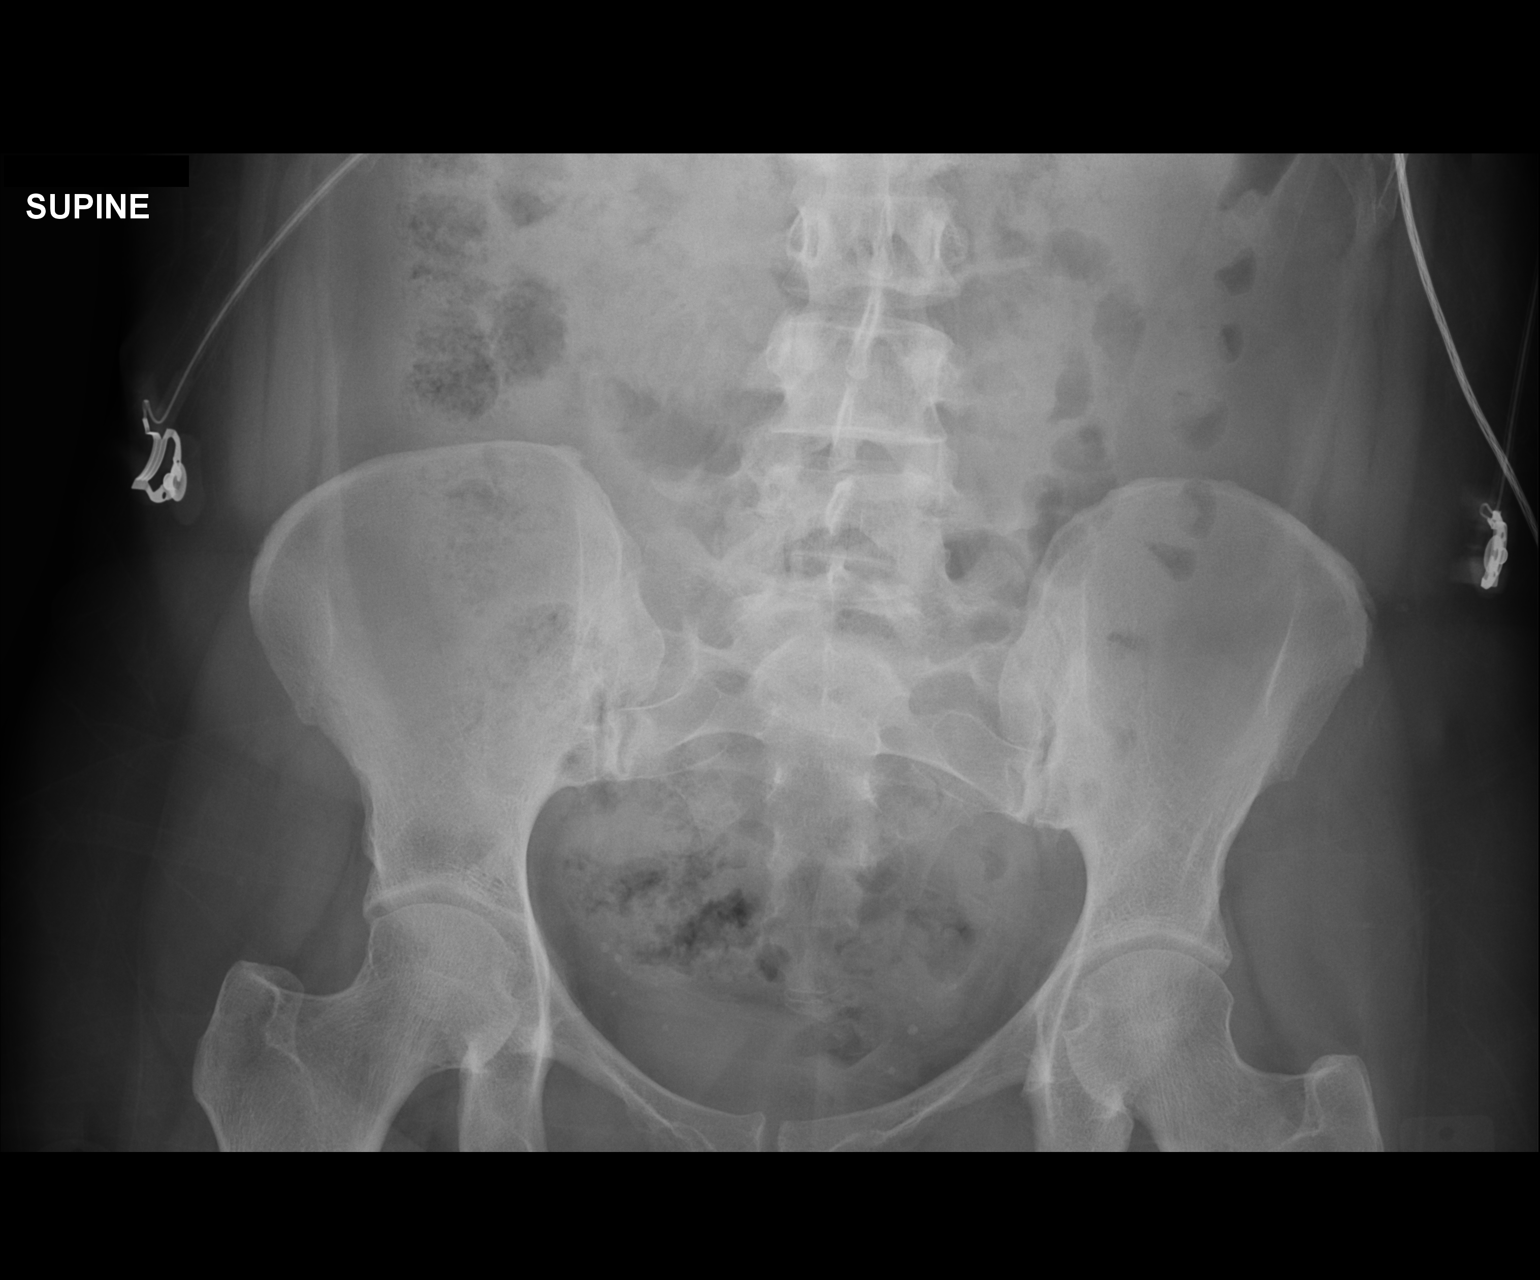

[1 of 1 positions shown; findings below may reference images not displayed]

FINDINGS: There is no evidence of pelvic fracture or diastasis. No pelvic bone
lesions are seen. Soft tissues about the pelvis and lower abdomen
are unremarkable.
IMPRESSION: Negative.

## 2017-06-15 IMAGING — DX DG HAND COMPLETE 3+V*R*
3 series · 3 of 3 positions shown · non-contrast
Comparison: None.

CLINICAL DATA: Initial encounter for Fall from building, about 2
stories, right hand laceration- anterior hand at 5th metacarpal.

EXAM:
RIGHT HAND - COMPLETE 3+ VIEW

[hand pa]
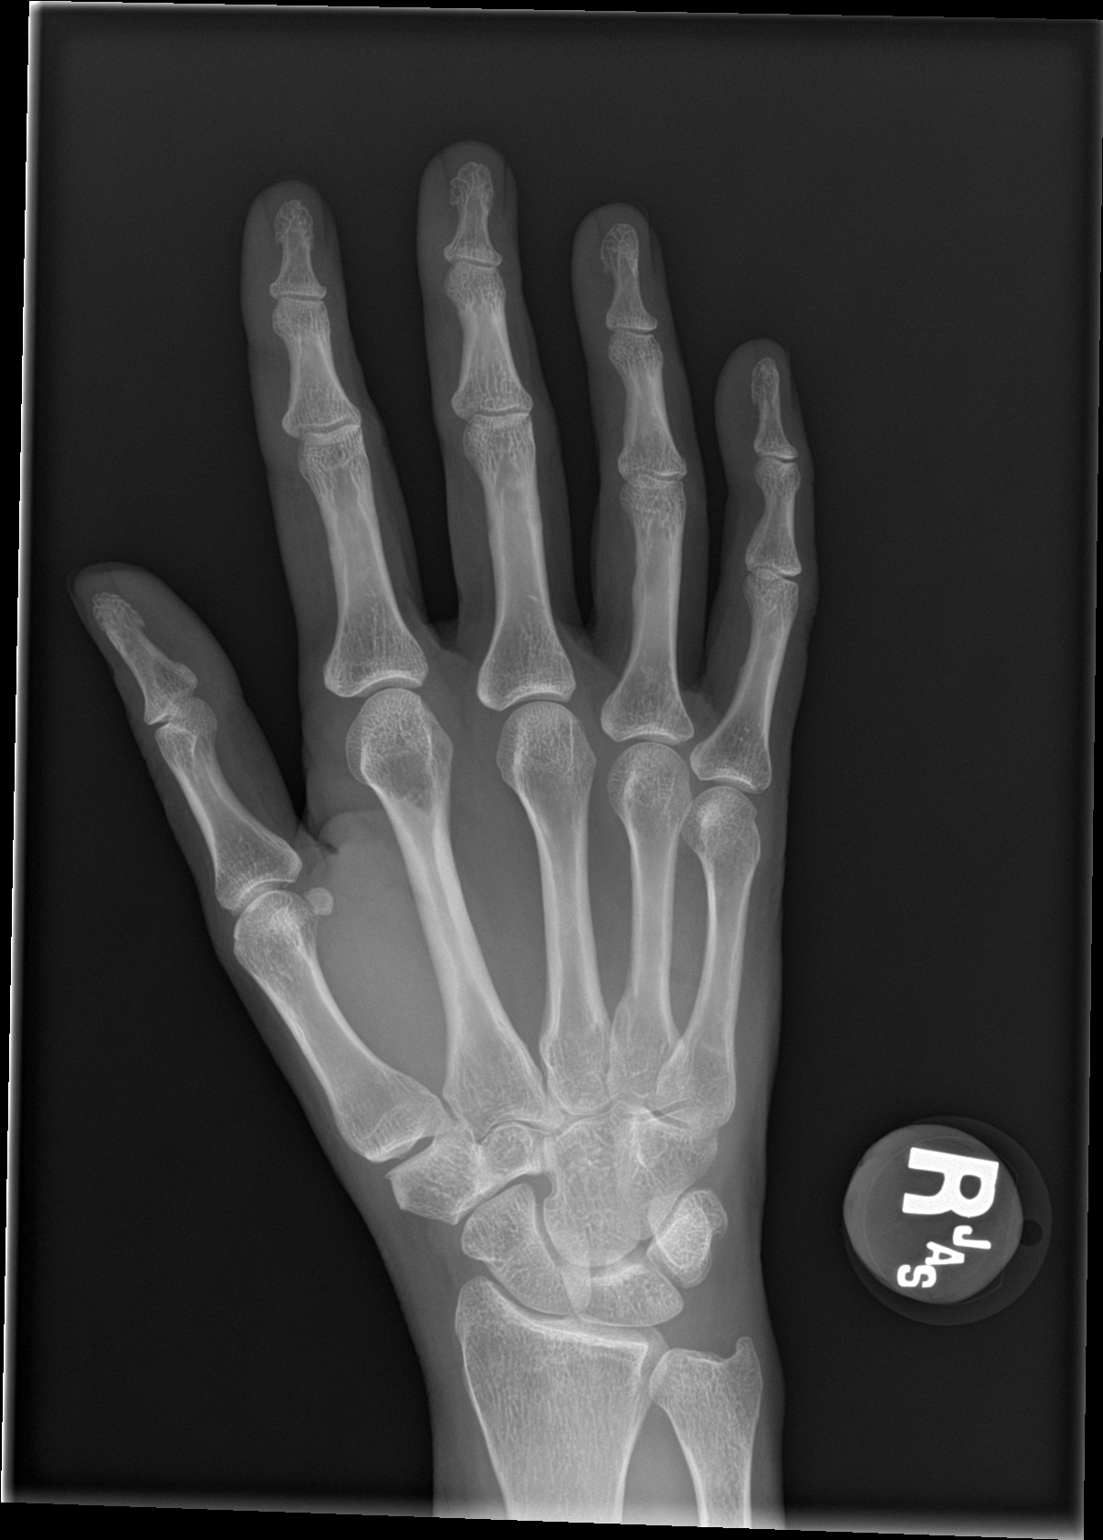

[hand obl]
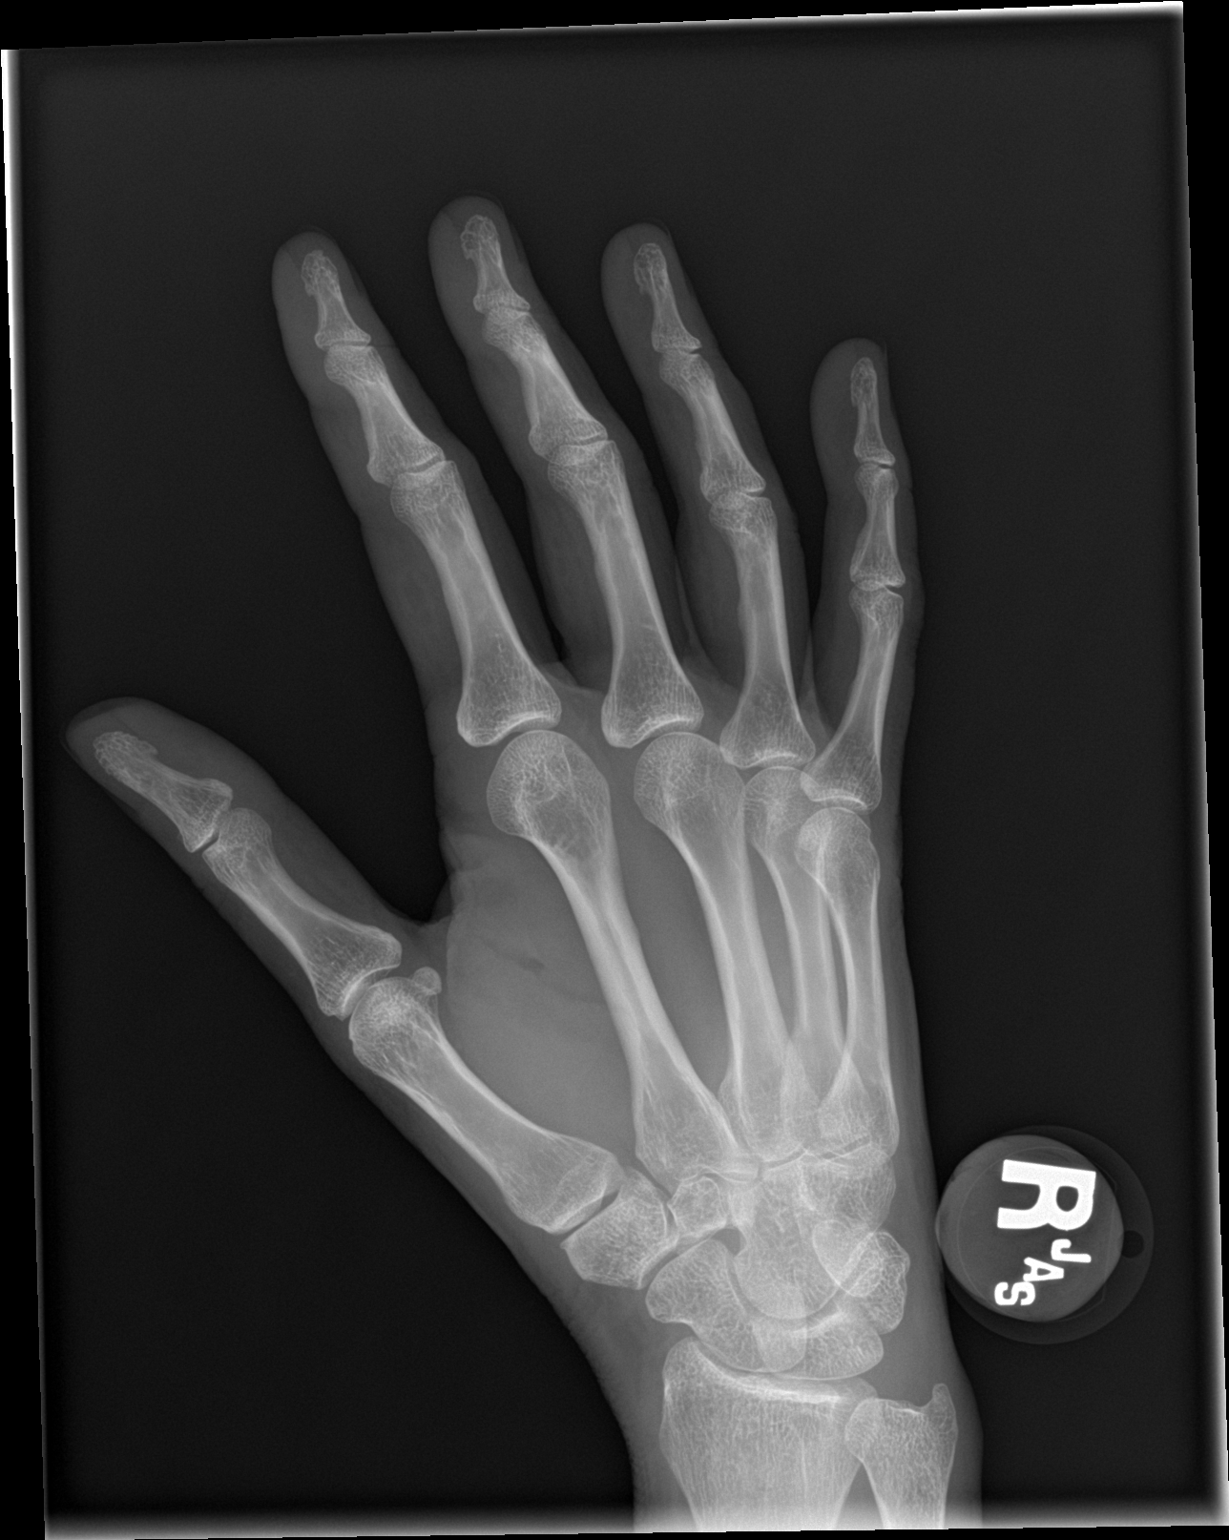

[hand lat]
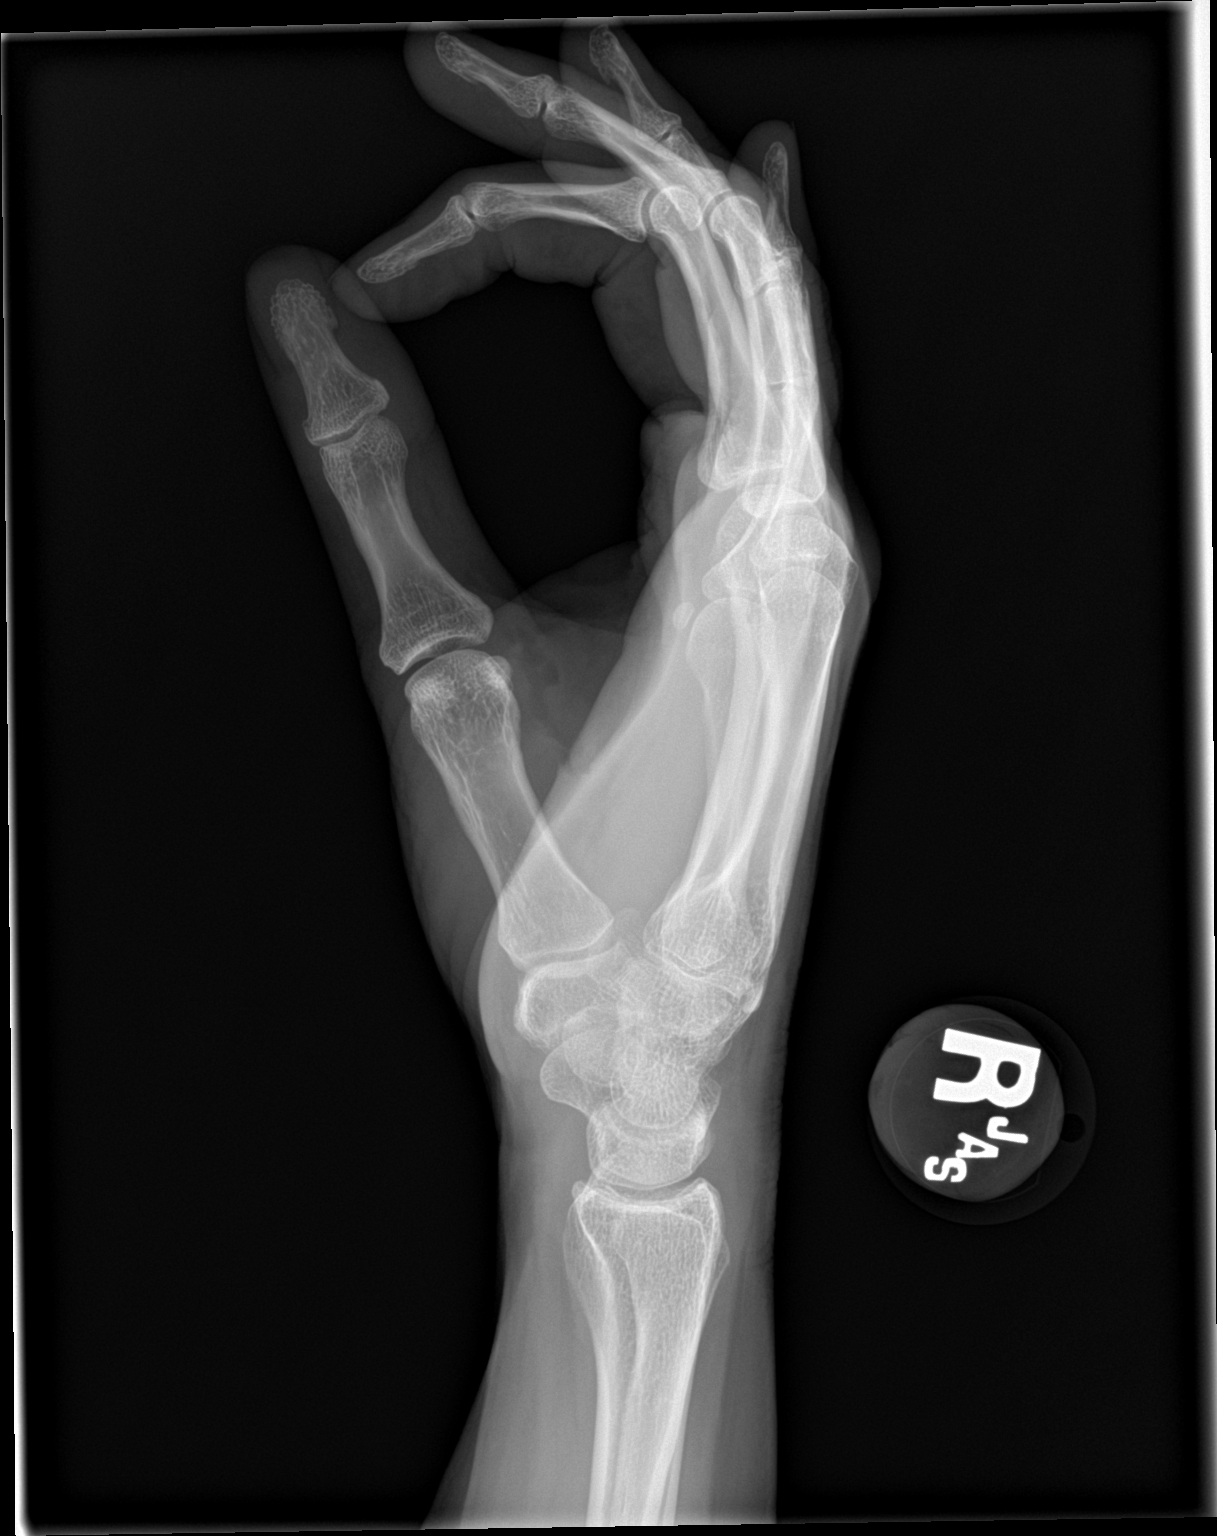

[3 of 3 positions shown; findings below may reference images not displayed]

FINDINGS: Overlap of fingers on the lateral. Possible nondisplaced fracture at
the fifth metacarpal neck, most apparent on the oblique image. No
growth plate extension.
IMPRESSION: Possible nondisplaced fracture involving the fifth metacarpal neck.
Correlate with point tenderness.

## 2017-06-15 IMAGING — CT CT ABD-PELV W/ CM
2 of 5 series · 15 of 46 positions shown, 17 images · IV contrast (Omni 300)
Comparison: Chest and pelvic radiographs of 06/29/2015.

CLINICAL DATA: Fall approximately 30 feet from ladder.

EXAM:
CT CHEST, ABDOMEN, AND PELVIS WITH CONTRAST
TECHNIQUE: Multidetector CT imaging of the chest, abdomen and pelvis was
performed following the standard protocol during bolus
administration of intravenous contrast.
CONTRAST:  100mL OVRLWH-1KK IOPAMIDOL (OVRLWH-1KK) INJECTION 61%

[Series 2: cap with 5.0 mm st · axial · 0.92mm/px · z∈[-875,-315]mm · 12 of 127 slices shown, 14 images]
[im 8/127  soft-tissue]
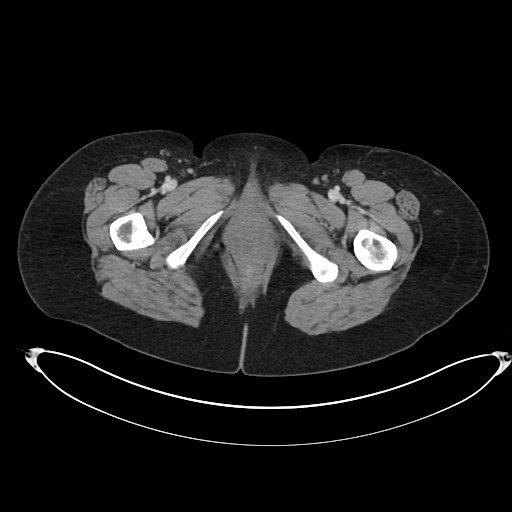
[im 8/127  bone]
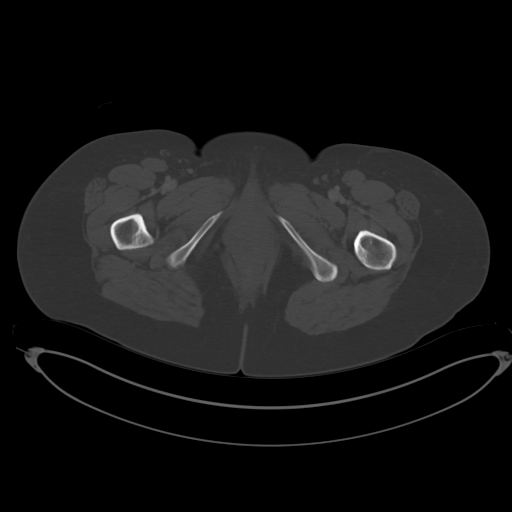
[im 22/127  soft-tissue]
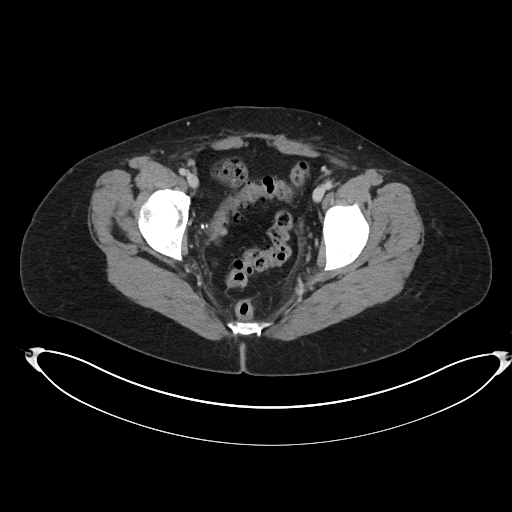
[im 29/127  soft-tissue]
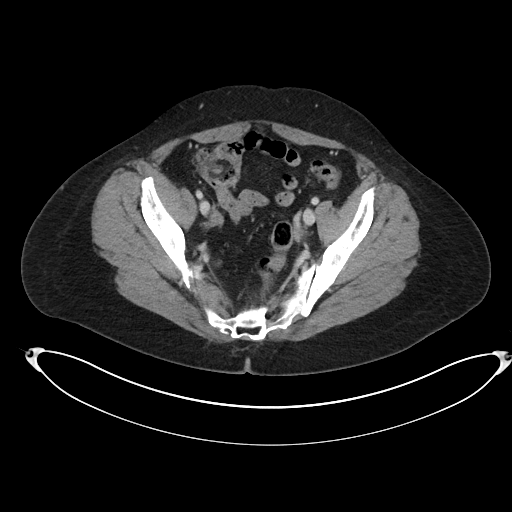
[im 36/127  soft-tissue]
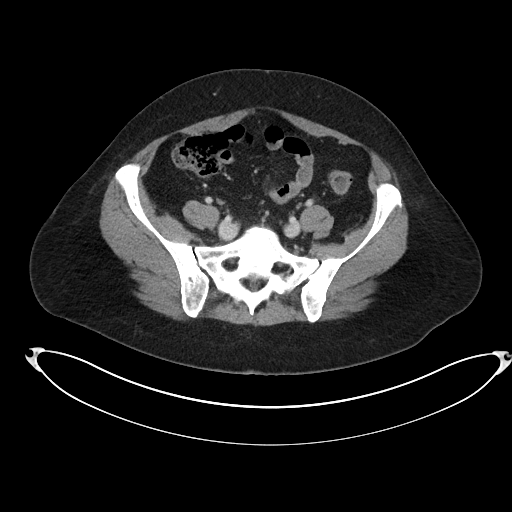
[im 50/127  soft-tissue]
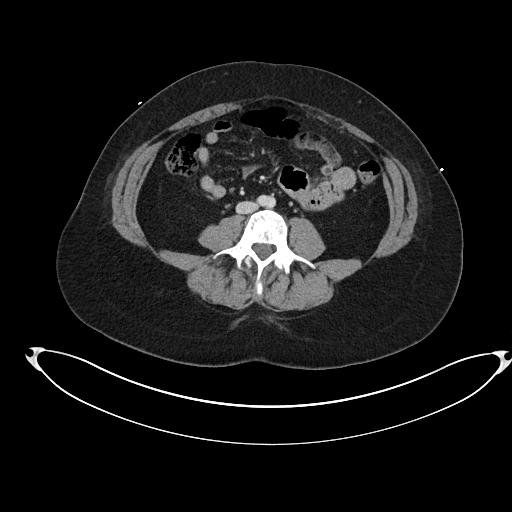
[im 57/127  soft-tissue]
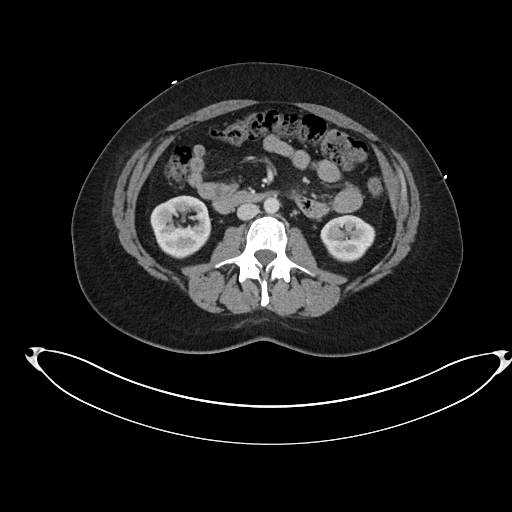
[im 71/127  soft-tissue]
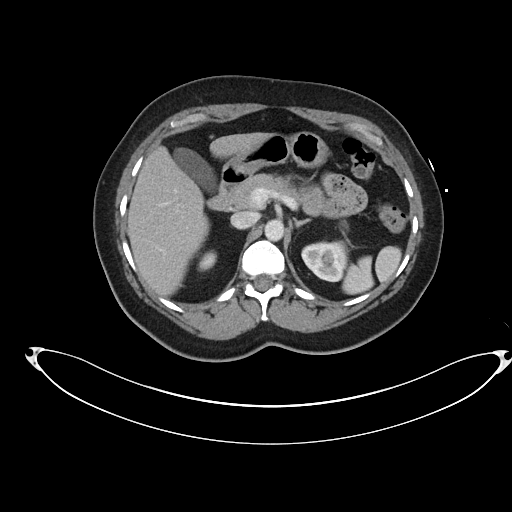
[im 78/127  soft-tissue]
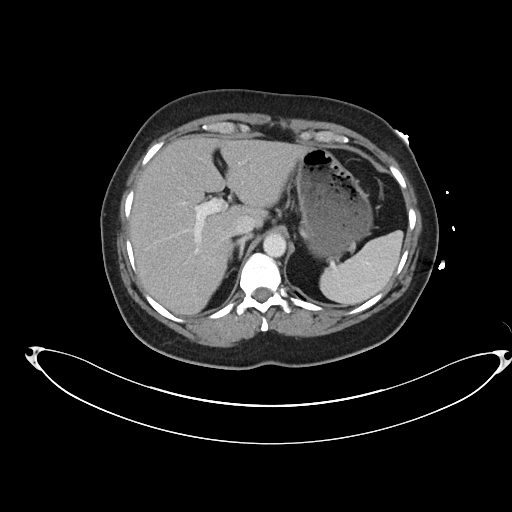
[im 92/127  soft-tissue]
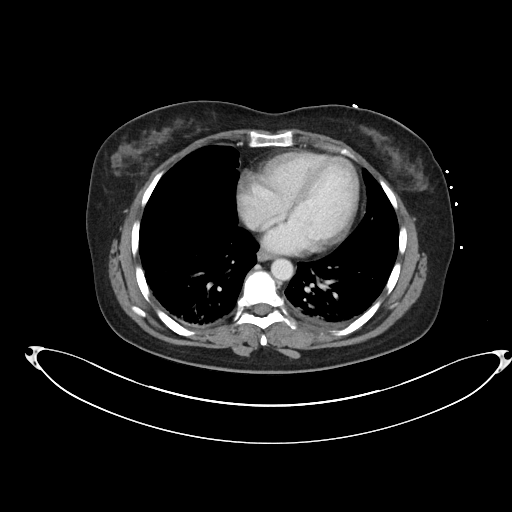
[im 92/127  bone]
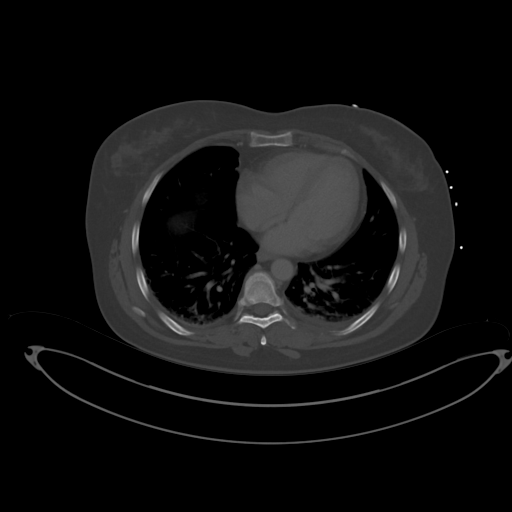
[im 99/127  soft-tissue]
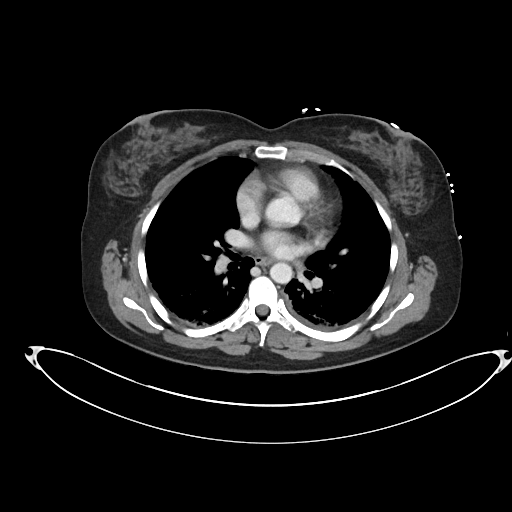
[im 106/127  soft-tissue]
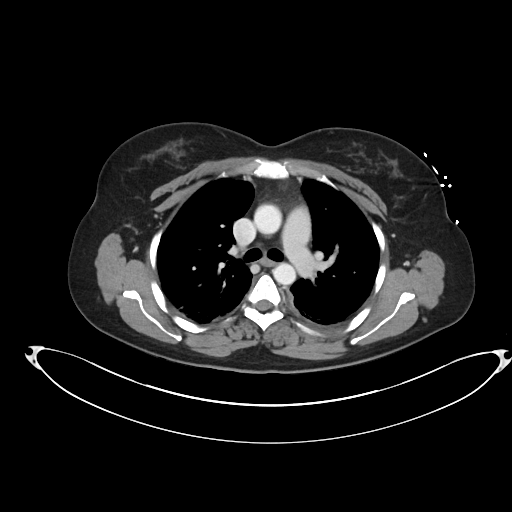
[im 120/127  soft-tissue]
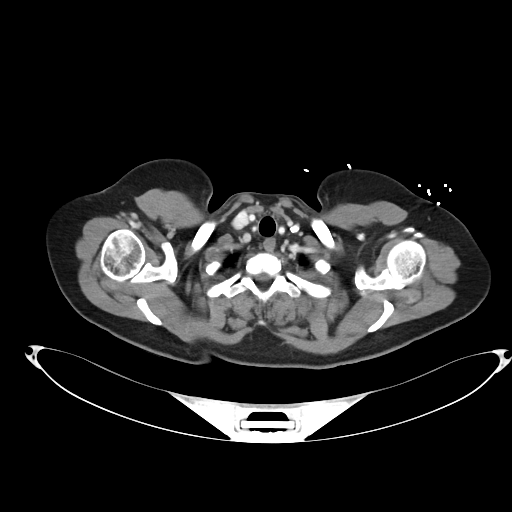

[Series 4: cap with 3.0 mm st cor · coronal · 0.82mm/px · 3 of 90 slices shown]
[im 30/90  soft-tissue]
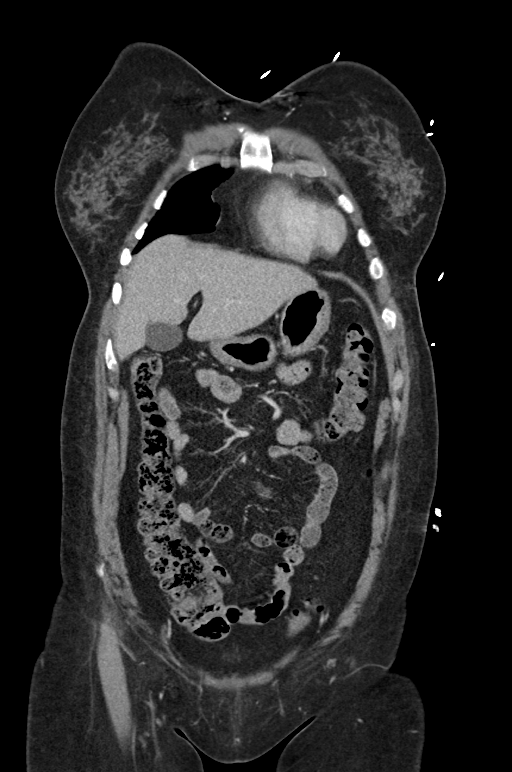
[im 40/90  soft-tissue]
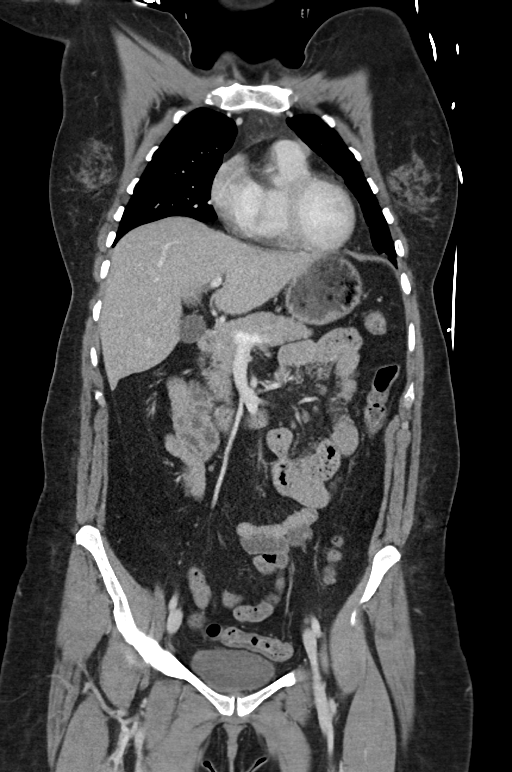
[im 50/90  soft-tissue]
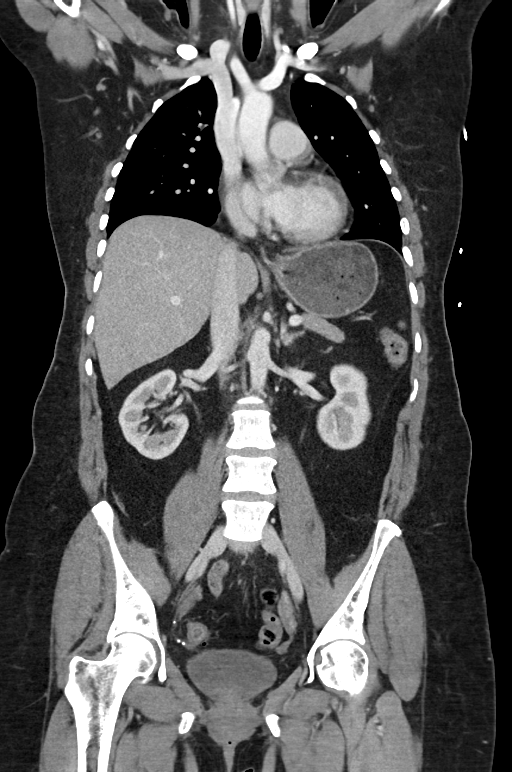

[15 of 46 positions shown; findings below may reference images not displayed]

FINDINGS: CT CHEST FINDINGS

Mediastinum/Lymph Nodes: Normal aorta, without evidence of aortic
laceration or mediastinal hematoma. Normal heart size, without
pericardial effusion. No mediastinal or hilar adenopathy. Tiny
hiatal hernia.

Lungs/Pleura: Trace left pleural fluid. No pneumothorax. Mild
bibasilar atelectasis.

Musculoskeletal: Nondisplaced fracture of the left T3 through T7
transverse processes.

CT ABDOMEN PELVIS FINDINGS

Hepatobiliary: Normal liver. Normal gallbladder, without biliary
ductal dilatation.

Pancreas: Normal, without mass or ductal dilatation.

Spleen: Normal in size, without focal abnormality.

Adrenals/Urinary Tract: Normal adrenal glands. Normal left kidney.
Partially duplicated right renal collecting system. Normal urinary
bladder.

Stomach/Bowel: Normal remainder of the stomach. Scattered colonic
diverticula. Normal colon and terminal ileum. Normal small bowel. No
pneumatosis or free intraperitoneal air.

Vascular/Lymphatic: Normal caliber of the aorta and branch vessels.
No abdominopelvic adenopathy.

Reproductive: Hysterectomy.  No adnexal mass.

Other: No significant free fluid.

Musculoskeletal: No acute osseous abnormality.
IMPRESSION: 1. Left transverse process nondisplaced fractures at T3 through T7.
2. No other posttraumatic deformity identified.
3. Trace left pleural fluid.

## 2017-06-21 ENCOUNTER — Encounter: Payer: Self-pay | Admitting: Family

## 2017-06-21 ENCOUNTER — Ambulatory Visit: Payer: BC Managed Care – PPO | Admitting: Family

## 2017-06-21 ENCOUNTER — Ambulatory Visit (INDEPENDENT_AMBULATORY_CARE_PROVIDER_SITE_OTHER)
Admission: RE | Admit: 2017-06-21 | Discharge: 2017-06-21 | Disposition: A | Discharge status: Home / Self Care | Payer: BC Managed Care – PPO | Source: Ambulatory Visit | Attending: Family | Admitting: Family

## 2017-06-21 VITALS — BP 106/70 | HR 68 | Temp 98.8°F | Ht 64.0 in | Wt 172.0 lb

## 2017-06-21 DIAGNOSIS — M25571 Pain in right ankle and joints of right foot: Secondary | ICD-10-CM

## 2017-06-21 NOTE — Progress Notes (Signed)
Regina [name] is a 52 y.o. female with the following history as recorded in [employer]:  Patient Active Problem List   Diagnosis Date Noted  . Sweats, menopausal 10/20/2016  . Panic attacks 10/20/2016  . Loss of transverse plantar arch of right foot 09/07/2016  . Other bursal cyst of wrist 08/11/2016  . Posterior right knee pain 07/16/2016  . Knee pain, right 05/21/2016  . Hamstring strain, sequela 05/21/2016  . Wart viral 05/12/2016  . Contusion of knee 05/05/2016  . Fall involving sidewalk curb 05/05/2016  . Aphasia 03/13/2016  . Shoulder pain, bilateral 12/11/2015  . Fibromyalgia 12/11/2015  . Post concussion syndrome 11/01/2015  . Clavicle fracture 10/30/2015  . Hair loss 10/11/2015  . Pain in joint, shoulder region 09/11/2015  . Right wrist pain 08/07/2015  . Splinter in skin 07/30/2015  . Fall from ladder 07/30/2015  . Skull fracture with concussion ([place]) 07/30/2015  . Fracture of cervical vertebra, C6 ([place]) 07/30/2015  . Thoracic vertebral fracture ([place]) 07/30/2015  . Abdominal pain, epigastric 05/07/2015  . Scaly patch rash 02/26/2015  . Mitral valve regurgitation 09/27/2014  . PAT (paroxysmal atrial tachycardia) ([place]) 09/06/2014  . Tachycardia 07/31/2014  . Sprain of ankle 06/29/2014  . Earache, left 05/15/2013  . Dizziness 05/15/2013  . Asthmatic bronchitis 05/08/2013  . Benign paroxysmal positional vertigo 05/08/2013  . Chest wall pain 01/26/2013  . LUQ abdominal pain 01/20/2013  . Insomnia 01/10/2013  . Depression (emotion) 01/10/2013  . URI, acute 08/25/2012  . Snoring 08/25/2012  . IBS (irritable bowel syndrome) 02/08/2012  . Multiple pulmonary nodules 02/08/2012  . Chest heaviness 11/16/2010  . GERD 05/24/2010  . SINUSITIS, MAXILLARY, CHRONIC 05/20/2010  . Myalgia and myositis 11/20/2009  . Fatigue 11/20/2009  . ARTHRALGIA 06/26/2009  . Cervicogenic headache 06/26/2009  . TOBACCO USE, QUIT 06/26/2009  . LOW BACK PAIN 02/20/2009  . Rosacea  12/27/2008  . PRURITUS 12/27/2008  . Allergic urticaria 12/27/2008  . B12 deficiency 01/02/2008    Current Outpatient Medications  Medication Sig Dispense Refill  . Cholecalciferol (GNP VITAMIN D) 1000 units tablet Take 2 tablets (2,000 Units total) by mouth daily. 100 tablet 3  . cyanocobalamin (,VITAMIN B-12,) 1000 MCG/ML injection Inject 1 mL (1,000 mcg total) into the skin every 14 (fourteen) days. 10 mL 11  . ergocalciferol (VITAMIN D2) 50000 units capsule Take 1 capsule (50,000 Units total) by mouth once a week. 6 capsule 0  . Turmeric 500 MG TABS Take by mouth 2 (two) times daily.    Marland Kitchen albuterol (PROAIR HFA) 108 (90 Base) MCG/ACT inhaler Inhale 2 puffs into the lungs every 6 (six) hours as needed for wheezing or shortness of breath (before exercising). (Patient not taking: Reported on 03/10/2017) 1 Inhaler 6  . CASCARA SAGRADA PO Take by mouth.    . Fluocinolone Acetonide Body (DERMA-SMOOTHE/FS BODY) 0.01 % OIL Apply 1 application topically 2 (two) times daily. (Patient not taking: Reported on 06/21/2017) 118 mL 0  . hydrocortisone 2.5 % cream Apply topically 2 (two) times daily. (Patient not taking: Reported on 03/10/2017) 40 g 1  . Phosphatidylserine-DHA-EPA (VAYACOG) 100-19.5-6.5 MG CAPS Take 1 capsule by mouth daily. (Patient not taking: Reported on 03/10/2017) 30 capsule 11  . SYRINGE-NEEDLE, DISP, 3 ML (BD ECLIPSE SYRINGE) 25G X 1" 3 ML MISC Use IM (Patient not taking: Reported on 06/21/2017) 50 each 11   No current facility-administered medications for this visit.     Allergies: Indocin [indomethacin]; Morphine sulfate; Sulfacetamide sodium; Caffeine; Morphine and related; Sulfa  antibiotics; Azithromycin; and Erythromycin stearate  Past Medical History:  Diagnosis Date  . Allergic urticaria   . ARTHRALGIA   . Arthritis   . Atrial fibrillation ([place])   . B12 DEFICIENCY   . CFS (chronic fatigue syndrome)   . Endometriosis   . Epidural hemorrhage with loss of consciousness ([place])  06/29/15  . Fibroid   . Fibromyalgia   . Fracture of cervical vertebra, C6 ([place]) 06/29/15  . Gastritis   . GERD   . Heart murmur   . History of endometriosis   . IBS (irritable bowel syndrome)   . Internal hemorrhoids   . MVP (mitral valve prolapse)   . Osteoarthritis   . Rosacea   . Thoracic compression fracture ([place]) 06/29/15   T2-T6    Past Surgical History:  Procedure Laterality Date  . ABDOMINAL HYSTERECTOMY     partial  . ABDOMINAL HYSTERECTOMY    . ANAL FISSURE REPAIR     Dr Rise [name] 2009  . APPENDECTOMY    . COLPOSCOPY    . HEMORRHOID SURGERY     Dr. Rise [name] 2009  . OVARIAN CYST REMOVAL    . RT BUNIONECTOMT    . TONSILLECTOMY AND ADENOIDECTOMY    . TUBAL LIGATION    . UTERINE FIBROID SURGERY      Family History  Problem Relation Age of Onset  . Lung cancer Mother   . Thyroid cancer Mother   . Cancer Mother        INTESTINAL  . Healthy Father   . Diabetes Unknown        uncles/aunts  . Von Willebrand disease Daughter   . Asthma Neg Hx     Social History   Tobacco Use  . Smoking status: Former Smoker    Last attempt to quit: 10/31/1988    Years since quitting: 28.6  . Smokeless tobacco: Never Used  Substance Use Topics  . Alcohol use: Yes    Alcohol/week: 0.6 - 1.2 oz    Types: 1 - 2 Standard drinks or equivalent per week    Comment: occasional     Subjective:  Right ankle pain x 1 day; tripped while walking yesterday- foot turned in a hole while out walking in the yard; mild swelling noted over ankle; would like to get X-ray to make sure no fracture; has been icing, elevating;   Objective:  Vitals:   06/21/17 1153  BP: 106/70  Pulse: 68  Temp: 98.8 F (37.1 C)  TempSrc: Oral  SpO2: 99%  Weight: 172 lb 0.6 oz (78 kg)  Height: 5\' 4"  (1.626 m)    General: Well developed, well nourished, in no acute distress  Skin : Warm and dry.  Head: Normocephalic and atraumatic  Lungs: Respirations unlabored; clear to auscultation bilaterally without  wheeze, rales, rhonchi  Musculoskeletal: No deformities; mild swelling noted lateral right ankle; no bruising noted; no warmth or redness;  Extremities: No edema, cyanosis, clubbing  Vessels: Symmetric bilaterally  Neurologic: Alert and oriented; speech intact; face symmetrical; moves all extremities well; CNII-XII intact without focal deficit  Assessment:  1. Acute right ankle pain     Plan:  Update X-ray- no fracture; continue to ice, elevate and wrap- she has boot at home she can use; follow-up worse, no better.   No follow-ups on file.  Orders Placed This Encounter  Procedures  . DG Ankle Complete Right    Standing Status:   Future    Number of Occurrences:   1  Standing Expiration Date:   08/22/2018    Order Specific Question:   Reason for Exam (SYMPTOM  OR DIAGNOSIS REQUIRED)    Answer:   right ankle pain/ swelling s/p fall    Order Specific Question:   Is patient pregnant?    Answer:   No    Order Specific Question:   Preferred imaging location?    Answer:   [facility]    Order Specific Question:   Radiology Contrast Protocol - do NOT remove file path    Answer:   [email or web address]    Requested Prescriptions    No prescriptions requested or ordered in this encounter

## 2017-07-14 ENCOUNTER — Ambulatory Visit: Payer: BC Managed Care – PPO | Admitting: Internal Medicine

## 2017-07-14 ENCOUNTER — Encounter: Payer: Self-pay | Admitting: Internal Medicine

## 2017-07-14 DIAGNOSIS — H1033 Unspecified acute conjunctivitis, bilateral: Secondary | ICD-10-CM

## 2017-07-14 DIAGNOSIS — H109 Unspecified conjunctivitis: Secondary | ICD-10-CM | POA: Insufficient documentation

## 2017-07-14 MED ORDER — CIPROFLOXACIN HCL 0.3 % OP SOLN: 1.0000 [drp] | OPHTHALMIC | 0 refills | Status: DC

## 2017-07-14 NOTE — Patient Instructions (Signed)

## 2017-07-14 NOTE — Assessment & Plan Note (Signed)
Cipro eye gtt Ophth ref if not well

## 2017-07-14 NOTE — Progress Notes (Signed)
Subjective:  Patient ID: Regina [name], female    DOB: 10-24-1965  Age: 52 y.o. MRN: [id number]  CC: No chief complaint on file.   HPI EMRYS MCEACHRON presents for pink eye L>R x 2 d  Outpatient Medications Prior to Visit  Medication Sig Dispense Refill  . albuterol (PROAIR HFA) 108 (90 Base) MCG/ACT inhaler Inhale 2 puffs into the lungs every 6 (six) hours as needed for wheezing or shortness of breath (before exercising). 1 Inhaler 6  . CASCARA SAGRADA PO Take by mouth.    . Cholecalciferol (GNP VITAMIN D) 1000 units tablet Take 2 tablets (2,000 Units total) by mouth daily. 100 tablet 3  . cyanocobalamin (,VITAMIN B-12,) 1000 MCG/ML injection Inject 1 mL (1,000 mcg total) into the skin every 14 (fourteen) days. 10 mL 11  . ergocalciferol (VITAMIN D2) 50000 units capsule Take 1 capsule (50,000 Units total) by mouth once a week. 6 capsule 0  . Fluocinolone Acetonide Body (DERMA-SMOOTHE/FS BODY) 0.01 % OIL Apply 1 application topically 2 (two) times daily. 118 mL 0  . hydrocortisone 2.5 % cream Apply topically 2 (two) times daily. 40 g 1  . Phosphatidylserine-DHA-EPA (VAYACOG) 100-19.5-6.5 MG CAPS Take 1 capsule by mouth daily. 30 capsule 11  . SYRINGE-NEEDLE, DISP, 3 ML (BD ECLIPSE SYRINGE) 25G X 1" 3 ML MISC Use IM 50 each 11  . Turmeric 500 MG TABS Take by mouth 2 (two) times daily.     No facility-administered medications prior to visit.     ROS Review of Systems  Constitutional: Negative for activity change, appetite change, chills, fatigue and unexpected weight change.  HENT: Negative for congestion, mouth sores and sinus pressure.   Eyes: Positive for pain, discharge and redness. Negative for visual disturbance.  Respiratory: Negative for cough and chest tightness.   Gastrointestinal: Negative for abdominal pain and nausea.  Genitourinary: Negative for difficulty urinating, frequency and vaginal pain.  Musculoskeletal: Negative for back pain and gait problem.  Skin:  Negative for pallor and rash.  Neurological: Negative for dizziness, tremors, weakness, numbness and headaches.  Psychiatric/Behavioral: Negative for confusion and sleep disturbance.    Objective:  BP 122/78 (BP Location: Left Arm, Patient Position: Sitting, Cuff Size: Large)   Pulse 68   Temp 98.5 F (36.9 C) (Oral)   Ht 5\' 4"  (1.626 m)   Wt 170 lb (77.1 kg)   SpO2 98%   BMI 29.18 kg/m   BP Readings from Last 3 Encounters:  07/14/17 122/78  06/21/17 106/70  03/10/17 100/64    Wt Readings from Last 3 Encounters:  07/14/17 170 lb (77.1 kg)  06/21/17 172 lb 0.6 oz (78 kg)  03/10/17 175 lb (79.4 kg)    Physical Exam  Constitutional: She appears well-developed and well-nourished. No distress.  Eyes: Right eye exhibits discharge. Left eye exhibits discharge.    eryth conjuntiva B, purulent d/c Skin - clear  Lab Results  Component Value Date   WBC 6.8 07/20/2016   HGB 12.6 07/20/2016   HCT 37.2 07/20/2016   PLT 305 07/20/2016   GLUCOSE 88 03/10/2017   CHOL 201 (H) 02/26/2015   TRIG 65.0 02/26/2015   HDL 62.10 02/26/2015   LDLCALC 126 (H) 02/26/2015   ALT 10 03/10/2017   AST 14 03/10/2017   NA 138 03/10/2017   K 3.8 03/10/2017   CL 102 03/10/2017   CREATININE 0.87 03/10/2017   BUN 10 03/10/2017   CO2 27 03/10/2017   TSH 1.09 03/10/2017   INR 1.16  06/29/2015    Dg Ankle Complete Right  Result Date: 06/21/2017 CLINICAL DATA:  Status post fall, right ankle injury EXAM: RIGHT ANKLE - COMPLETE 3+ VIEW COMPARISON:  None. FINDINGS: There is no evidence of fracture, dislocation, or joint effusion. There is no evidence of arthropathy or other focal bone abnormality. Soft tissues are unremarkable. IMPRESSION: No acute osseous injury of the right ankle. Electronically Signed   By: Kathreen Devoid   On: 06/21/2017 12:18    Assessment & Plan:   There are no diagnoses linked to this encounter. I am having Anyelin L. Coghill "Lucy" maintain her SYRINGE-NEEDLE (DISP) 3 ML,  cyanocobalamin, CASCARA SAGRADA PO, Phosphatidylserine-DHA-EPA, Cholecalciferol, Turmeric, hydrocortisone, ergocalciferol, albuterol, and Fluocinolone Acetonide Body.  No orders of the defined types were placed in this encounter.    Follow-up: No follow-ups on file.  Walker Kehr, MD

## 2017-07-23 ENCOUNTER — Encounter: Payer: Self-pay | Admitting: Obstetrics and Gynecology

## 2017-07-23 ENCOUNTER — Other Ambulatory Visit (HOSPITAL_COMMUNITY)
Admission: RE | Admit: 2017-07-23 | Discharge: 2017-07-23 | Disposition: A | Discharge status: Home / Self Care | Payer: BC Managed Care – PPO | Source: Ambulatory Visit | Attending: Obstetrics and Gynecology | Admitting: Obstetrics and Gynecology

## 2017-07-23 ENCOUNTER — Ambulatory Visit: Payer: BC Managed Care – PPO | Admitting: Obstetrics and Gynecology

## 2017-07-23 ENCOUNTER — Other Ambulatory Visit: Payer: Self-pay

## 2017-07-23 VITALS — BP 92/52 | HR 80 | Resp 14 | Ht 64.0 in | Wt 173.0 lb

## 2017-07-23 DIAGNOSIS — N9089 Other specified noninflammatory disorders of vulva and perineum: Secondary | ICD-10-CM | POA: Diagnosis not present

## 2017-07-23 DIAGNOSIS — Z8619 Personal history of other infectious and parasitic diseases: Secondary | ICD-10-CM | POA: Diagnosis not present

## 2017-07-23 DIAGNOSIS — N898 Other specified noninflammatory disorders of vagina: Secondary | ICD-10-CM

## 2017-07-23 DIAGNOSIS — Z1272 Encounter for screening for malignant neoplasm of vagina: Secondary | ICD-10-CM | POA: Diagnosis not present

## 2017-07-23 DIAGNOSIS — Z01419 Encounter for gynecological examination (general) (routine) without abnormal findings: Secondary | ICD-10-CM

## 2017-07-23 NOTE — Progress Notes (Signed)
52 y.o. [id number] MarriedCaucasianF here for annual exam.  She has a h/o a hysterectomy and vaginal dysplasia.  Pap from last year with LSIL, +HPV. Colposcopy done, biopsy with HPV effect, no dysplasia.  No vaginal bleeding. No dyspareunia. She c/o intermittent vaginal odor and intermittent vulvar irritation.  She has vasomotor symptoms, overall tolerable.     No LMP recorded. Patient has had a hysterectomy.          Sexually active: Yes.    The current method of family planning is status post hysterectomy.    Exercising: No.  The patient does not participate in regular exercise at present. Smoker:  no  Health Maintenance: Pap:  03-31-16 LGSIL HPB effect + HR HPV/ 08-05-15 LGSIL may include HPV or mild dysplasia  History of abnormal Pap:  yes MMG:  07-01-16 WNL  Colonoscopy:  12-18-11 WNL BMD:   Years ago TDaP:  06-29-15 Gardasil: N/A   reports that she quit smoking about 28 years ago. She has never used smokeless tobacco. She reports that she drinks about 0.6 - 1.2 oz of alcohol per week. She reports that she does not use drugs.  Past Medical History:  Diagnosis Date  . Allergic urticaria   . ARTHRALGIA   . Arthritis   . Atrial fibrillation ([place])   . B12 DEFICIENCY   . CFS (chronic fatigue syndrome)   . Endometriosis   . Epidural hemorrhage with loss of consciousness ([place]) 06/29/15  . Fibroid   . Fibromyalgia   . Fracture of cervical vertebra, C6 ([place]) 06/29/15  . Gastritis   . GERD   . Heart murmur   . History of endometriosis   . IBS (irritable bowel syndrome)   . Internal hemorrhoids   . MVP (mitral valve prolapse)   . Osteoarthritis   . Rosacea   . Thoracic compression fracture ([place]) 06/29/15   T2-T6    Past Surgical History:  Procedure Laterality Date  . ABDOMINAL HYSTERECTOMY     partial  . ABDOMINAL HYSTERECTOMY    . ANAL FISSURE REPAIR     Dr Rise [name] 2009  . APPENDECTOMY    . COLPOSCOPY    . HEMORRHOID SURGERY     Dr. Rise [name] 2009  . OVARIAN CYST REMOVAL    .  RT BUNIONECTOMT    . TONSILLECTOMY AND ADENOIDECTOMY    . TUBAL LIGATION    . UTERINE FIBROID SURGERY      Current Outpatient Medications  Medication Sig Dispense Refill  . albuterol (PROAIR HFA) 108 (90 Base) MCG/ACT inhaler Inhale 2 puffs into the lungs every 6 (six) hours as needed for wheezing or shortness of breath (before exercising). 1 Inhaler 6  . CASCARA SAGRADA PO Take by mouth.    . Cholecalciferol (GNP VITAMIN D) 1000 units tablet Take 2 tablets (2,000 Units total) by mouth daily. 100 tablet 3  . ciprofloxacin (CILOXAN) 0.3 % ophthalmic solution Place 1 drop into both eyes every 4 (four) hours while awake. 5 mL 0  . cyanocobalamin (,VITAMIN B-12,) 1000 MCG/ML injection Inject 1 mL (1,000 mcg total) into the skin every 14 (fourteen) days. 10 mL 11  . Fluocinolone Acetonide 0.01 % SHAM Apply topically.    . hydrocortisone 2.5 % cream Apply topically 2 (two) times daily. 40 g 1  . SYRINGE-NEEDLE, DISP, 3 ML (BD ECLIPSE SYRINGE) 25G X 1" 3 ML MISC Use IM 50 each 11  . Turmeric 500 MG TABS Take by mouth 2 (two) times daily.     No  current facility-administered medications for this visit.     Family History  Problem Relation Age of Onset  . Lung cancer Mother   . Thyroid cancer Mother   . Cancer Mother        INTESTINAL  . Healthy Father   . Diabetes Unknown        uncles/aunts  . Von Willebrand disease Daughter   . Asthma Neg Hx     Review of Systems  Constitutional: Negative.   HENT: Negative.   Eyes: Negative.   Respiratory: Negative.   Cardiovascular: Positive for leg swelling.  Gastrointestinal: Negative.   Endocrine: Negative.   Genitourinary: Negative.   Musculoskeletal: Negative.   Skin: Negative.   Allergic/Immunologic: Negative.   Neurological: Negative.   Psychiatric/Behavioral: Negative.     Exam:   BP (!) 92/52 (BP Location: Right Arm, Patient Position: Sitting, Cuff Size: Normal)   Pulse 80   Resp 14   Ht 5\' 4"  (1.626 m)   Wt 173 lb (78.5 kg)    BMI 29.70 kg/m   Weight change: @WEIGHTCHANGE @ Height:   Height: 5\' 4"  (162.6 cm)  Ht Readings from Last 3 Encounters:  07/23/17 5\' 4"  (1.626 m)  07/14/17 5\' 4"  (1.626 m)  06/21/17 5\' 4"  (1.626 m)    General appearance: alert, cooperative and appears stated age Head: Normocephalic, without obvious abnormality, atraumatic Neck: no adenopathy, supple, symmetrical, trachea midline and thyroid normal to inspection and palpation Lungs: clear to auscultation bilaterally Cardiovascular: regular rate and rhythm Breasts: normal appearance, no masses or tenderness Abdomen: soft, non-tender; non distended,  no masses,  no organomegaly Extremities: extremities normal, atraumatic, no cyanosis or edema Skin: Skin color, texture, turgor normal. No rashes or lesions Lymph nodes: Cervical, supraclavicular, and axillary nodes normal. No abnormal inguinal nodes palpated Neurologic: Grossly normal   Pelvic: External genitalia:  no lesions              Urethra:  normal appearing urethra with no masses, tenderness or lesions              Bartholins and Skenes: normal                 Vagina: normal appearing vagina with normal color and discharge, no lesions              Cervix: absent               Bimanual Exam:  Uterus:  uterus absent              Adnexa: no mass, fullness, tenderness               Rectovaginal: Confirms               Anus:  normal sphincter tone, no lesions  Chaperone was present for exam.  A:  Well Woman with normal exam  H/o Abnormal vaginal pap, biopsy with hpv effect, prior VAIN  C/O vaginal odor, intermittent irritation    P:   Pap with hpv, check for BV and yeast  Mammogram due  She will check on her colonoscopy  Labs with primary MD, including vit d  Discussed breast self exam  Discussed calcium and vit D intake  Addendum: she c/o swelling in her feet, advised to f/u with primary care MD.

## 2017-07-23 NOTE — Patient Instructions (Signed)

## 2017-07-27 LAB — CYTOLOGY - PAP
BACTERIAL VAGINITIS: NEGATIVE
Candida vaginitis: NEGATIVE
DIAGNOSIS: NEGATIVE
HPV (WINDOPATH): NOT DETECTED

## 2017-09-19 ENCOUNTER — Encounter (HOSPITAL_BASED_OUTPATIENT_CLINIC_OR_DEPARTMENT_OTHER): Payer: Self-pay | Admitting: Respiratory Therapy

## 2017-09-19 ENCOUNTER — Emergency Department (HOSPITAL_BASED_OUTPATIENT_CLINIC_OR_DEPARTMENT_OTHER): Payer: BC Managed Care – PPO

## 2017-09-19 ENCOUNTER — Other Ambulatory Visit: Payer: Self-pay

## 2017-09-19 ENCOUNTER — Emergency Department (HOSPITAL_BASED_OUTPATIENT_CLINIC_OR_DEPARTMENT_OTHER)
Admission: EM | Admit: 2017-09-19 | Discharge: 2017-09-19 | Disposition: A | Discharge status: Home / Self Care | Payer: BC Managed Care – PPO | Attending: Emergency Medicine | Admitting: Emergency Medicine

## 2017-09-19 DIAGNOSIS — Z79899 Other long term (current) drug therapy: Secondary | ICD-10-CM | POA: Diagnosis not present

## 2017-09-19 DIAGNOSIS — Z87891 Personal history of nicotine dependence: Secondary | ICD-10-CM | POA: Diagnosis not present

## 2017-09-19 DIAGNOSIS — R079 Chest pain, unspecified: Secondary | ICD-10-CM | POA: Insufficient documentation

## 2017-09-19 LAB — CBC
HCT: 37.3 % (ref 36.0–46.0)
Hemoglobin: 12.7 g/dL (ref 12.0–15.0)
MCH: 29.5 pg (ref 26.0–34.0)
MCHC: 34 g/dL (ref 30.0–36.0)
MCV: 86.5 fL (ref 78.0–100.0)
PLATELETS: 302 10*3/uL (ref 150–400)
RBC: 4.31 MIL/uL (ref 3.87–5.11)
RDW: 12.4 % (ref 11.5–15.5)
WBC: 6.6 10*3/uL (ref 4.0–10.5)

## 2017-09-19 LAB — BASIC METABOLIC PANEL
Anion gap: 7 (ref 5–15)
BUN: 14 mg/dL (ref 6–20)
CALCIUM: 8.6 mg/dL — AB (ref 8.9–10.3)
CHLORIDE: 107 mmol/L (ref 101–111)
CO2: 27 mmol/L (ref 22–32)
CREATININE: 0.67 mg/dL (ref 0.44–1.00)
GFR calc Af Amer: >60 mL/min (ref >60)
GFR calc non Af Amer: >60 mL/min (ref >60)
GLUCOSE: 100 mg/dL — AB (ref 65–99)
Potassium: 3.7 mmol/L (ref 3.5–5.1)
Sodium: 141 mmol/L (ref 135–145)

## 2017-09-19 LAB — TROPONIN I: Troponin I: <0.03 ng/mL (ref <0.03)

## 2017-09-19 LAB — D-DIMER, QUANTITATIVE: D-Dimer, Quant: <0.27 ug/mL-FEU (ref 0.00–0.50)

## 2017-09-19 MED ORDER — GI COCKTAIL ~~LOC~~
30.0000 mL | Freq: Once | ORAL | Status: AC
  Administered 2017-09-19: 30 mL via ORAL
  Filled 2017-09-19: qty 30

## 2017-09-19 MED ORDER — ACETAMINOPHEN 325 MG PO TABS
650.0000 mg | ORAL_TABLET | Freq: Once | ORAL | Status: AC
  Administered 2017-09-19: 650 mg via ORAL
  Filled 2017-09-19: qty 2

## 2017-09-19 NOTE — Discharge Instructions (Signed)
Return to the ER or see your doctor if you develop worsening chest pain or any other new/concerning symptoms such as shortness of breath, fever, vomiting, back pain/abdominal pain, or other new symptoms.  Otherwise follow-up with your cardiologist in about 1 week.

## 2017-09-19 NOTE — ED Provider Notes (Signed)
[facility] DEPT Provider Note: Georgena Spurling, MD, FACEP  CSN: [id number] MRN: [id number] ARRIVAL: 09/19/17 at [place]: MHOTF/OTF   CHIEF COMPLAINT  Chest Pain   HISTORY OF PRESENT ILLNESS  09/19/17 5:28 AM Regina [name] is a 52 y.o. female who complains of chest pain that began yesterday afternoon about 4 PM.  The pain is located below her left breast and radiates to her back below her left scapula.  She rates her pain as a 4 out of 10 and describes it is sharp or feeling like gas.  Nothing makes the pain better or worse.  There is no associated shortness of breath or diaphoresis.  She did have some nausea yesterday but none presently.  She also complains of a sharp left-sided headache that preceded the chest pain.  She noticed her right popliteal fossa to be tender and warm yesterday.  It no longer feels warm but she is still having pain there.   Past Medical History:  Diagnosis Date  . Allergic urticaria   . ARTHRALGIA   . Arthritis   . Atrial fibrillation ([place])   . B12 DEFICIENCY   . CFS (chronic fatigue syndrome)   . Endometriosis   . Epidural hemorrhage with loss of consciousness ([place]) 06/29/15  . Fibroid   . Fibromyalgia   . Fracture of cervical vertebra, C6 ([place]) 06/29/15  . Gastritis   . GERD   . Heart murmur   . History of endometriosis   . IBS (irritable bowel syndrome)   . Internal hemorrhoids   . MVP (mitral valve prolapse)   . Osteoarthritis   . Rosacea   . Thoracic compression fracture ([place]) 06/29/15   T2-T6    Past Surgical History:  Procedure Laterality Date  . ABDOMINAL HYSTERECTOMY     partial  . ABDOMINAL HYSTERECTOMY    . ANAL FISSURE REPAIR     Dr Rise [name] 2009  . APPENDECTOMY    . COLPOSCOPY    . HEMORRHOID SURGERY     Dr. Rise [name] 2009  . OVARIAN CYST REMOVAL    . RT BUNIONECTOMT    . TONSILLECTOMY AND ADENOIDECTOMY    . TUBAL LIGATION    . UTERINE FIBROID SURGERY      Family History  Problem Relation Age of Onset  . Lung  cancer Mother   . Thyroid cancer Mother   . Cancer Mother        INTESTINAL  . Healthy Father   . Diabetes Unknown        uncles/aunts  . Von Willebrand disease Daughter   . Asthma Neg Hx     Social History   Tobacco Use  . Smoking status: Former Smoker    Last attempt to quit: 10/31/1988    Years since quitting: 28.9  . Smokeless tobacco: Never Used  Substance Use Topics  . Alcohol use: Yes    Alcohol/week: 0.6 - 1.2 oz    Types: 1 - 2 Standard drinks or equivalent per week    Comment: occasional   . Drug use: No    Prior to Admission medications   Medication Sig Start Date End Date Taking? Authorizing Provider  albuterol (PROAIR HFA) 108 (90 Base) MCG/ACT inhaler Inhale 2 puffs into the lungs every 6 (six) hours as needed for wheezing or shortness of breath (before exercising). 07/24/16 07/24/17  Plotnikov, [name], MD  CASCARA SAGRADA PO Take by mouth.    [provider]  Cholecalciferol (GNP VITAMIN D) 1000 units tablet Take  2 tablets (2,000 Units total) by mouth daily. 10/11/15   Plotnikov, [name], MD  ciprofloxacin (CILOXAN) 0.3 % ophthalmic solution Place 1 drop into both eyes every 4 (four) hours while awake. 07/14/17   Plotnikov, [name], MD  cyanocobalamin (,VITAMIN B-12,) 1000 MCG/ML injection Inject 1 mL (1,000 mcg total) into the skin every 14 (fourteen) days. 10/22/14   Plotnikov, [name], MD  Fluocinolone Acetonide 0.01 % SHAM Apply topically.    [provider]  hydrocortisone 2.5 % cream Apply topically 2 (two) times daily. 12/13/15   Plotnikov, [name], MD  SYRINGE-NEEDLE, DISP, 3 ML (BD ECLIPSE SYRINGE) 25G X 1" 3 ML MISC Use IM 10/10/14   Plotnikov, [name], MD  Turmeric 500 MG TABS Take by mouth 2 (two) times daily.    [provider]    Allergies Indocin [indomethacin]; Morphine sulfate; Sulfacetamide sodium; Caffeine; Morphine and related; Sulfa antibiotics; Azithromycin; and Erythromycin stearate   REVIEW OF SYSTEMS    Negative except as noted here or in the History of Present Illness.   PHYSICAL EXAMINATION  Initial Vital Signs Blood pressure 116/78, pulse 71, temperature 98.1 F (36.7 C), temperature source Oral, resp. rate 20, height 5\' 4"  (1.626 m), weight 77.1 kg (170 lb), SpO2 100 %.  Examination General: Well-developed, well-nourished female in no acute distress; appearance consistent with age of record HENT: normocephalic; atraumatic Eyes: pupils equal, round and reactive to light; extraocular muscles intact Neck: supple Heart: regular rate and rhythm; no murmur Lungs: clear to auscultation bilaterally Chest: Nontender Abdomen: soft; nondistended; nontender; no masses or hepatosplenomegaly; bowel sounds present Extremities: No deformity; full range of motion; pulses normal; tenderness right popliteal fossa without mass, swelling or warmth; no edema Neurologic: Awake, alert and oriented; motor function intact in all extremities and symmetric; no facial droop Skin: Warm and dry Psychiatric: Normal mood and affect   RESULTS  Summary of this visit's results, reviewed by myself:   EKG Interpretation  Date/Time:  Sunday September 19 2017 04:53:12 EDT Ventricular Rate:  85 PR Interval:    QRS Duration: 104 QT Interval:  386 QTC Calculation: 459 R Axis:   60 Text Interpretation:  Sinus rhythm Low voltage, precordial leads Borderline T abnormalities, anterior leads Confirmed by Shanon Rosser [phone]) on 09/19/2017 5:28:26 AM      Laboratory Studies: Results for orders placed or performed during the hospital encounter of 09/19/17 (from the past 24 hour(s))  Basic metabolic panel     Status: Abnormal   Collection Time: 09/19/17  5:13 AM  Result Value Ref Range   Sodium 141 135 - 145 mmol/L   Potassium 3.7 3.5 - 5.1 mmol/L   Chloride 107 101 - 111 mmol/L   CO2 27 22 - 32 mmol/L   Glucose, Bld 100 (H) 65 - 99 mg/dL   BUN 14 6 - 20 mg/dL   Creatinine, Ser 0.67 0.44 - 1.00 mg/dL   Calcium 8.6  (L) 8.9 - 10.3 mg/dL   GFR calc non Af Amer >60 >60 mL/min   GFR calc Af Amer >60 >60 mL/min   Anion gap 7 5 - 15  CBC     Status: None   Collection Time: 09/19/17  5:13 AM  Result Value Ref Range   WBC 6.6 4.0 - 10.5 K/uL   RBC 4.31 3.87 - 5.11 MIL/uL   Hemoglobin 12.7 12.0 - 15.0 g/dL   HCT 37.3 36.0 - 46.0 %   MCV 86.5 78.0 - 100.0 fL   MCH 29.5  26.0 - 34.0 pg   MCHC 34.0 30.0 - 36.0 g/dL   RDW 12.4 11.5 - 15.5 %   Platelets 302 150 - 400 K/uL  Troponin I     Status: None   Collection Time: 09/19/17  5:13 AM  Result Value Ref Range   Troponin I <0.03 <0.03 ng/mL  D-dimer, quantitative (not at [facility])     Status: None   Collection Time: 09/19/17  5:13 AM  Result Value Ref Range   D-Dimer, Quant <0.27 0.00 - 0.50 ug/mL-FEU  Troponin I     Status: None   Collection Time: 09/19/17  8:08 AM  Result Value Ref Range   Troponin I <0.03 <0.03 ng/mL   Imaging Studies: Dg Chest 2 View  Result Date: 09/19/2017 CLINICAL DATA:  Initial evaluation for acute chest pain. EXAM: CHEST - 2 VIEW COMPARISON:  Prior radiograph from 07/20/2016. FINDINGS: The cardiac and mediastinal silhouettes are stable in size and contour, and remain within normal limits. The lungs are normally inflated. No airspace consolidation, pleural effusion, or pulmonary edema is identified. There is no pneumothorax. No acute osseous abnormality identified. IMPRESSION: No active cardiopulmonary disease. Electronically Signed   By: Jeannine Boga M.D.   On: 09/19/2017 05:35    ED COURSE and MDM  Nursing notes and initial vitals signs, including pulse oximetry, reviewed.  Vitals:   09/19/17 0708 09/19/17 0730 09/19/17 0800 09/19/17 0830  BP: 104/77 102/70 95/60 96/62   Pulse: (!) 57 (!) 58 (!) 59 (!) 59  Resp: 14 14 18 15   Temp:      TempSrc:      SpO2: 95% 98% 97% 97%  Weight:      Height:       7:02 AM D-dimer is normal.  Her pain is now a 2 out of 10 after GI cocktail but she is not sure the GI cocktail made  a significant difference.  We will obtain a second troponin at the 3-hour mark.  PROCEDURES    ED DIAGNOSES     ICD-10-CM   1. Nonspecific chest pain R07.9        Aveion Nguyen, Jenny Reichmann, MD 09/19/17 2234

## 2017-09-19 NOTE — ED Provider Notes (Signed)
Care transferred to me.  Second troponin is negative.  Per prior plan with Dr. Florina Ou, discharge home.  She has a cardiologist and will advise her to follow-up with Dr. Tamala Julian.   Sherwood Gambler, MD 09/19/17 [phone]

## 2017-09-19 NOTE — ED Triage Notes (Signed)
Chest pain to central chest through to shoulder blades and under L breast. Also reports L arm pain, nausea since Saturday (yesterday) at 1600. Pt states pain has woken her from sleep this morning.

## 2017-09-21 ENCOUNTER — Telehealth: Payer: Self-pay | Admitting: Physician Assistant

## 2017-09-21 NOTE — Telephone Encounter (Signed)
No note needed/error- kw

## 2017-10-04 NOTE — Progress Notes (Signed)
Cardiology Office Note:    Date:  10/05/2017   ID:  Regina [name], DOB 1965-06-28, MRN [id number]  PCP:  Cassandria Anger, MD  Cardiologist:  No primary care provider on file.   Referring MD: Cassandria Anger, MD   Chief Complaint  Patient presents with  . Chest Pain    pt stated she had some chest pain on sunday    History of Present Illness:    Regina [name] is a 52 y.o. female with a hx of paroxysmal atrial tachycardia, mitral valve regurgitation, and former tobacco use. She was last seen by Dr. Tamala Julian 09/07/14. At that time, she was having recurrent episodes of paroxysmal rapid heart rate with associated chest tightness. Echo was obtained at that time, which showed normal EF and mild to moderate mitral valve regurgitation. Repeat echo 09/2015 stable. She was recently seen in the ED on 09/19/17 for chest pain. She was ruled out for MI with two negative troponins and discharged home with cardiology follow up. Of note, D-dimer was also negative. At that occurrence, she described waking up at 4am with sharp chest pain rated 7/10 under her left breast that radiated to her back. The chest pain was constant and last for the rest of the day.   She presents today for follow up for her chest pain. She is treated as a new patient considering her length of time since last visit. Since being discharged from the ER, she has had one other occurrence of chest pain which was last Sunday. The pain was located under her left breast and lasted approximately 2 hours. No associated symptoms, no relieving or aggravating factors. She suffered a 20 ft fall to concrete 2-3 years ago and now suffers random aches and pains, complicated by her fibromyalgia. She has similar pains from gas, but gas pains have never woken her from sleep before. Risk factors of CAD include prior smoking history (quit at age 18). No family history of heart disease.   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 She has 2 grown children and three grandchildren. She does not describe exertional chest pain that is relieved with rest. She does describe occassional leg cramping and abdominal cramping - I asked her to follow up with her PCP. She reports that she follows a "holistic" lifestyle and appreciates a less invasive strategy, if possible.   Past Medical History:  Diagnosis Date  . Allergic urticaria   . ARTHRALGIA   . Arthritis   . Atrial fibrillation ([place])   . B12 DEFICIENCY   . CFS (chronic fatigue syndrome)   . Endometriosis   . Epidural hemorrhage with loss of consciousness ([place]) 06/29/15  . Fibroid   . Fibromyalgia   . Fracture of cervical vertebra, C6 ([place]) 06/29/15  . Gastritis   . GERD   . Heart murmur   . History of endometriosis   . IBS (irritable bowel syndrome)   . Internal hemorrhoids   . MVP (mitral valve prolapse)   . Osteoarthritis   . Rosacea   . Thoracic compression fracture ([place]) 06/29/15   T2-T6    Past Surgical History:  Procedure Laterality Date  . ABDOMINAL HYSTERECTOMY     partial  . ABDOMINAL HYSTERECTOMY    . ANAL FISSURE REPAIR     Dr Rise [name] 2009  . APPENDECTOMY    . COLPOSCOPY    . HEMORRHOID SURGERY  Dr. Rise [name] 2009  . OVARIAN CYST REMOVAL    . RT BUNIONECTOMT    . TONSILLECTOMY AND ADENOIDECTOMY    . TUBAL LIGATION    . UTERINE FIBROID SURGERY      Current Medications: Current Meds  Medication Sig  . CASCARA SAGRADA PO Take by mouth.  . Cholecalciferol (GNP VITAMIN D) 1000 units tablet Take 2 tablets (2,000 Units total) by mouth daily.  . cyanocobalamin (,VITAMIN B-12,) 1000 MCG/ML injection Inject 1 mL (1,000 mcg total) into the skin every 14 (fourteen) days.     Allergies:   Indocin [indomethacin]; Morphine sulfate; Sulfacetamide sodium; Caffeine; Morphine and related; Sulfa antibiotics; Azithromycin; and Erythromycin stearate   Social History   Socioeconomic History    . Marital status: Married    Spouse name: Not on file  . Number of children: Not on file  . Years of education: Not on file  . Highest education level: Not on file  Occupational History  . Not on file  Social Needs  . Financial resource strain: Not on file  . Food insecurity:    Worry: Not on file    Inability: Not on file  . Transportation needs:    Medical: Not on file    Non-medical: Not on file  Tobacco Use  . Smoking status: Former Smoker    Last attempt to quit: 10/31/1988    Years since quitting: 28.9  . Smokeless tobacco: Never Used  Substance and Sexual Activity  . Alcohol use: Yes    Alcohol/week: 0.6 - 1.2 oz    Types: 1 - 2 Standard drinks or equivalent per week    Comment: occasional   . Drug use: No  . Sexual activity: Yes    Partners: Male    Birth control/protection: Surgical  Lifestyle  . Physical activity:    Days per week: Not on file    Minutes per session: Not on file  . Stress: Not on file  Relationships  . Social connections:    Talks on phone: Not on file    Gets together: Not on file    Attends religious service: Not on file    Active member of club or organization: Not on file    Attends meetings of clubs or organizations: Not on file    Relationship status: Not on file  Other Topics Concern  . Not on file  Social History Narrative   ** Merged History Encounter **       Working 2 jobs, 6d/wk      Regular exercise-no      Divorced     Family History: The patient's family history includes Cancer in her mother; Diabetes in her unknown relative; Healthy in her father; Lung cancer in her mother; Thyroid cancer in her mother; Von Willebrand disease in her daughter. There is no history of Asthma.  ROS:   Please see the history of present illness.     All other systems reviewed and are negative.  EKGs/Labs/Other Studies Reviewed:    The following studies were reviewed today:  none  EKG:  EKG is ordered today.  The ekg ordered today  demonstrates sinus rhythm, no ischemia  Recent Labs: 03/10/2017: ALT 10; Magnesium 2.1; TSH 1.09 09/19/2017: BUN 14; Creatinine, Ser 0.67; Hemoglobin 12.7; Platelets 302; Potassium 3.7; Sodium 141  Recent Lipid Panel    Component Value Date/Time   CHOL 201 (H) 02/26/2015 1337   TRIG 65.0 02/26/2015 1337   HDL 62.10 02/26/2015 1337   CHOLHDL  3 02/26/2015 1337   VLDL 13.0 02/26/2015 1337   LDLCALC 126 (H) 02/26/2015 1337    Physical Exam:    VS:  BP 90/68   Pulse 73   Ht 5\' 4"  (1.626 m)   Wt 174 lb (78.9 kg)   BMI 29.87 kg/m     Wt Readings from Last 3 Encounters:  10/05/17 174 lb (78.9 kg)  09/19/17 170 lb (77.1 kg)  07/23/17 173 lb (78.5 kg)     GEN: Well nourished, well developed in no acute distress HEENT: Normal NECK: No JVD; No carotid bruits LYMPHATICS: No lymphadenopathy CARDIAC: RRR, no murmurs, rubs, gallops RESPIRATORY:  Clear to auscultation without rales, wheezing or rhonchi  ABDOMEN: Soft, non-tender, non-distended MUSCULOSKELETAL:  No edema; No deformity  SKIN: Warm and dry NEUROLOGIC:  Alert and oriented x 3 PSYCHIATRIC:  Normal affect   ASSESSMENT:    1. Chest pain, unspecified type   2. PAT (paroxysmal atrial tachycardia) ([place])   3. Mitral valve insufficiency, unspecified etiology    PLAN:    In order of problems listed above:  Chest pain, unspecified type I do not suspect a cardiac etiology for her chest pain.  Her main risk factor for CAD is obesity, she quit smoking cigarettes at age 22.  She does not have a strong family history of heart disease.  In consultation with Dr. Gwenlyn Found (DOD), will obtain a calcium score.  I will hold off on an echocardiogram at this time.  PAT (paroxysmal atrial tachycardia) (HCC) NSR today.  She does not report problems with palpitations.  Mitral valve insufficiency, unspecified etiology Appears stable.  No shortness of breath.  Follow up PRN.   Medication Adjustments/Labs and Tests Ordered: Current  medicines are reviewed at length with the patient today.  Concerns regarding medicines are outlined above.  Orders Placed This Encounter  Procedures  . CT CARDIAC SCORING  . EKG 12-Lead   No orders of the defined types were placed in this encounter.   Signed, Ledora Bottcher, PA  10/05/2017 4:44 PM    [facility]

## 2017-10-05 ENCOUNTER — Ambulatory Visit: Payer: BC Managed Care – PPO | Admitting: Physician Assistant

## 2017-10-05 ENCOUNTER — Encounter: Payer: Self-pay | Admitting: Physician Assistant

## 2017-10-05 VITALS — BP 90/68 | HR 73 | Ht 64.0 in | Wt 174.0 lb

## 2017-10-05 DIAGNOSIS — I340 Nonrheumatic mitral (valve) insufficiency: Secondary | ICD-10-CM

## 2017-10-05 DIAGNOSIS — I471 Supraventricular tachycardia: Secondary | ICD-10-CM | POA: Diagnosis not present

## 2017-10-05 DIAGNOSIS — R079 Chest pain, unspecified: Secondary | ICD-10-CM

## 2017-10-05 NOTE — Patient Instructions (Signed)
Medication Instructions:  Your physician recommends that you continue on your current medications as directed. Please refer to the Current Medication list given to you today.  Labwork: none  Testing/Procedures: CALCIUM SCORE, THIS WILL COST $150 OUT OF POCKET [facility] AT [address]  Follow-Up: AS NEEDED   If you need a refill on your cardiac medications before your next appointment, please call your pharmacy.

## 2017-10-18 ENCOUNTER — Ambulatory Visit (INDEPENDENT_AMBULATORY_CARE_PROVIDER_SITE_OTHER)
Admission: RE | Admit: 2017-10-18 | Discharge: 2017-10-18 | Disposition: A | Discharge status: Home / Self Care | Payer: Self-pay | Source: Ambulatory Visit | Attending: Physician Assistant | Admitting: Physician Assistant

## 2017-10-18 DIAGNOSIS — R079 Chest pain, unspecified: Secondary | ICD-10-CM

## 2017-12-14 ENCOUNTER — Encounter: Payer: Self-pay | Admitting: Family

## 2017-12-14 ENCOUNTER — Ambulatory Visit: Payer: BC Managed Care – PPO | Admitting: Family

## 2017-12-14 VITALS — BP 102/68 | HR 67 | Temp 98.1°F | Ht 64.0 in | Wt 173.1 lb

## 2017-12-14 DIAGNOSIS — J029 Acute pharyngitis, unspecified: Secondary | ICD-10-CM

## 2017-12-14 DIAGNOSIS — E538 Deficiency of other specified B group vitamins: Secondary | ICD-10-CM | POA: Diagnosis not present

## 2017-12-14 LAB — POCT RAPID STREP A (OFFICE): RAPID STREP A SCREEN: NEGATIVE

## 2017-12-14 MED ORDER — CYANOCOBALAMIN 1000 MCG/ML IJ SOLN
1000.0000 ug | Freq: Once | INTRAMUSCULAR | Status: AC
  Administered 2017-12-14: 1000 ug via INTRAMUSCULAR

## 2017-12-14 NOTE — Progress Notes (Signed)
Regina [name] is a 52 y.o. female with the following history as recorded in EpicCare:  Patient Active Problem List   Diagnosis Date Noted  . Conjunctivitis 07/14/2017  . Sweats, menopausal 10/20/2016  . Panic attacks 10/20/2016  . Loss of transverse plantar arch of right foot 09/07/2016  . Other bursal cyst of wrist 08/11/2016  . Posterior right knee pain 07/16/2016  . Knee pain, right 05/21/2016  . Hamstring strain, sequela 05/21/2016  . Wart viral 05/12/2016  . Contusion of knee 05/05/2016  . Fall involving sidewalk curb 05/05/2016  . Aphasia 03/13/2016  . Shoulder pain, bilateral 12/11/2015  . Fibromyalgia 12/11/2015  . Post concussion syndrome 11/01/2015  . Clavicle fracture 10/30/2015  . Hair loss 10/11/2015  . Pain in joint, shoulder region 09/11/2015  . Right wrist pain 08/07/2015  . Splinter in skin 07/30/2015  . Fall from ladder 07/30/2015  . Skull fracture with concussion ([place]) 07/30/2015  . Fracture of cervical vertebra, C6 ([place]) 07/30/2015  . Thoracic vertebral fracture ([place]) 07/30/2015  . Abdominal pain, epigastric 05/07/2015  . Scaly patch rash 02/26/2015  . Mitral valve regurgitation 09/27/2014  . PAT (paroxysmal atrial tachycardia) ([place]) 09/06/2014  . Tachycardia 07/31/2014  . Sprain of ankle 06/29/2014  . Earache, left 05/15/2013  . Dizziness 05/15/2013  . Asthmatic bronchitis 05/08/2013  . Benign paroxysmal positional vertigo 05/08/2013  . Chest wall pain 01/26/2013  . LUQ abdominal pain 01/20/2013  . Insomnia 01/10/2013  . Depression (emotion) 01/10/2013  . URI, acute 08/25/2012  . Snoring 08/25/2012  . IBS (irritable bowel syndrome) 02/08/2012  . Multiple pulmonary nodules 02/08/2012  . Chest heaviness 11/16/2010  . GERD 05/24/2010  . SINUSITIS, MAXILLARY, CHRONIC 05/20/2010  . Myalgia and myositis 11/20/2009  . Fatigue 11/20/2009  . ARTHRALGIA 06/26/2009  . Cervicogenic headache 06/26/2009  . TOBACCO USE, QUIT 06/26/2009  . LOW BACK PAIN  02/20/2009  . Rosacea 12/27/2008  . PRURITUS 12/27/2008  . Allergic urticaria 12/27/2008  . B12 deficiency 01/02/2008    Current Outpatient Medications  Medication Sig Dispense Refill  . CASCARA SAGRADA PO Take by mouth.    . Cholecalciferol (GNP VITAMIN D) 1000 units tablet Take 2 tablets (2,000 Units total) by mouth daily. 100 tablet 3  . cyanocobalamin (,VITAMIN B-12,) 1000 MCG/ML injection Inject 1 mL (1,000 mcg total) into the skin every 14 (fourteen) days. 10 mL 11  . Turmeric 500 MG TABS Take by mouth 2 (two) times daily.    Marland Kitchen albuterol (PROAIR HFA) 108 (90 Base) MCG/ACT inhaler Inhale 2 puffs into the lungs every 6 (six) hours as needed for wheezing or shortness of breath (before exercising). 1 Inhaler 6   No current facility-administered medications for this visit.     Allergies: Indocin [indomethacin]; Morphine sulfate; Sulfacetamide sodium; Caffeine; Morphine and related; Sulfa antibiotics; Azithromycin; and Erythromycin stearate  Past Medical History:  Diagnosis Date  . Allergic urticaria   . ARTHRALGIA   . Arthritis   . Atrial fibrillation ([place])   . B12 DEFICIENCY   . CFS (chronic fatigue syndrome)   . Endometriosis   . Epidural hemorrhage with loss of consciousness ([place]) 06/29/15  . Fibroid   . Fibromyalgia   . Fracture of cervical vertebra, C6 ([place]) 06/29/15  . Gastritis   . GERD   . Heart murmur   . History of endometriosis   . IBS (irritable bowel syndrome)   . Internal hemorrhoids   . MVP (mitral valve prolapse)   . Osteoarthritis   . Rosacea   .  Thoracic compression fracture ([place]) 06/29/15   T2-T6    Past Surgical History:  Procedure Laterality Date  . ABDOMINAL HYSTERECTOMY     partial  . ABDOMINAL HYSTERECTOMY    . ANAL FISSURE REPAIR     Dr Rise [name] 2009  . APPENDECTOMY    . COLPOSCOPY    . HEMORRHOID SURGERY     Dr. Rise [name] 2009  . OVARIAN CYST REMOVAL    . RT BUNIONECTOMT    . TONSILLECTOMY AND ADENOIDECTOMY    . TUBAL LIGATION    .  UTERINE FIBROID SURGERY      Family History  Problem Relation Age of Onset  . Lung cancer Mother   . Thyroid cancer Mother   . Cancer Mother        INTESTINAL  . Healthy Father   . Diabetes Unknown        uncles/aunts  . Von Willebrand disease Daughter   . Asthma Neg Hx     Social History   Tobacco Use  . Smoking status: Former Smoker    Last attempt to quit: 10/31/1988    Years since quitting: 29.1  . Smokeless tobacco: Never Used  Substance Use Topics  . Alcohol use: Yes    Alcohol/week: 1.0 - 2.0 standard drinks    Types: 1 - 2 Standard drinks or equivalent per week    Comment: occasional     Subjective:  Patient presents with concerns for sinus drainage; worried about strep exposure- notes that stomach was  "sore" on Sunday-this has been her typical manifestation of strep in the past; states that stomach pain has now resolved/ no nausea, vomiting or diarrhea; actually feeling much better today but grandchildren have strep;   Also requesting to get B12 shot today;  Objective:  Vitals:   12/14/17 0809  BP: 102/68  Pulse: 67  Temp: 98.1 F (36.7 C)  TempSrc: Oral  SpO2: 98%  Weight: 173 lb 1.3 oz (78.5 kg)  Height: 5\' 4"  (1.626 m)    General: Well developed, well nourished, in no acute distress  Skin : Warm and dry.  Head: Normocephalic and atraumatic  Eyes: Sclera and conjunctiva clear; pupils round and reactive to light; extraocular movements intact  Ears: External normal; canals clear; tympanic membranes normal  Oropharynx: Pink, supple. No suspicious lesions  Neck: Supple without thyromegaly, adenopathy  Lungs: Respirations unlabored; clear to auscultation bilaterally without wheeze, rales, rhonchi  CVS exam: normal rate and regular rhythm.  Abdomen: Soft; nontender; nondistended; normoactive bowel sounds; no masses or hepatosplenomegaly  Neurologic: Alert and oriented; speech intact; face symmetrical; moves all extremities well; CNII-XII intact without focal  deficit   Assessment:  1. B12 deficiency   2. Sore throat     Plan:  1. B12 given today; 2. Rapid strep negative in office; reassurance- suspect post-nasal drainage; follow-up worse, no better.   No follow-ups on file.  Orders Placed This Encounter  Procedures  . POCT rapid strep A    Requested Prescriptions    No prescriptions requested or ordered in this encounter

## 2018-01-27 ENCOUNTER — Ambulatory Visit: Payer: BC Managed Care – PPO | Admitting: Internal Medicine

## 2018-01-27 ENCOUNTER — Encounter: Payer: Self-pay | Admitting: Internal Medicine

## 2018-01-27 DIAGNOSIS — J069 Acute upper respiratory infection, unspecified: Secondary | ICD-10-CM

## 2018-01-27 DIAGNOSIS — J4520 Mild intermittent asthma, uncomplicated: Secondary | ICD-10-CM | POA: Diagnosis not present

## 2018-01-27 MED ORDER — PROMETHAZINE-CODEINE 6.25-10 MG/5ML PO SYRP: 5.0000 mL | ORAL_SOLUTION | ORAL | 0 refills | Status: DC | PRN

## 2018-01-27 MED ORDER — ALBUTEROL SULFATE HFA 108 (90 BASE) MCG/ACT IN AERS: 2.0000 | INHALATION_SPRAY | Freq: Four times a day (QID) | RESPIRATORY_TRACT | 6 refills | Status: DC | PRN

## 2018-01-27 MED ORDER — FLUCONAZOLE 150 MG PO TABS: 150.0000 mg | ORAL_TABLET | Freq: Once | ORAL | 1 refills | Status: AC

## 2018-01-27 MED ORDER — CEFUROXIME AXETIL 500 MG PO TABS: ORAL_TABLET | ORAL | 1 refills | Status: DC

## 2018-01-27 NOTE — Assessment & Plan Note (Signed)
Ceftin x 7 d Prom-cod syr To work on [employer]

## 2018-01-27 NOTE — Progress Notes (Signed)
Subjective:  Patient ID: Regina [name], female    DOB: [birth year]  Age: 52 y.o. MRN: [id number]  CC: No chief complaint on file.   HPI Regina [name] presents for URI sx's since sat: green sinus d/c, HA C/o fatigue  Outpatient Medications Prior to Visit  Medication Sig Dispense Refill  . CASCARA SAGRADA PO Take by mouth.    . Cholecalciferol (GNP VITAMIN D) 1000 units tablet Take 2 tablets (2,000 Units total) by mouth daily. 100 tablet 3  . cyanocobalamin (,VITAMIN B-12,) 1000 MCG/ML injection Inject 1 mL (1,000 mcg total) into the skin every 14 (fourteen) days. 10 mL 11  . Turmeric 500 MG TABS Take by mouth 2 (two) times daily.    Marland Kitchen albuterol (PROAIR HFA) 108 (90 Base) MCG/ACT inhaler Inhale 2 puffs into the lungs every 6 (six) hours as needed for wheezing or shortness of breath (before exercising). 1 Inhaler 6   No facility-administered medications prior to visit.     ROS: Review of Systems  Constitutional: Negative for activity change, appetite change, chills, fatigue and unexpected weight change.  HENT: Positive for congestion, postnasal drip, rhinorrhea, sinus pressure and sinus pain. Negative for mouth sores.   Eyes: Negative for visual disturbance.  Respiratory: Positive for cough. Negative for chest tightness.   Gastrointestinal: Negative for abdominal pain and nausea.  Genitourinary: Negative for difficulty urinating, frequency and vaginal pain.  Musculoskeletal: Negative for back pain and gait problem.  Skin: Negative for pallor and rash.  Neurological: Negative for dizziness, tremors, weakness, numbness and headaches.  Psychiatric/Behavioral: Negative for confusion and sleep disturbance.    Objective:  BP 108/72 (BP Location: Left Arm, Patient Position: Sitting, Cuff Size: Normal)   Pulse 67   Temp 98.3 F (36.8 C) (Oral)   Ht 5\' 4"  (1.626 m)   Wt 171 lb (77.6 kg)   SpO2 98%   BMI 29.35 kg/m   BP Readings from Last 3 Encounters:  01/27/18 108/72    12/14/17 102/68  10/05/17 90/68    Wt Readings from Last 3 Encounters:  01/27/18 171 lb (77.6 kg)  12/14/17 173 lb 1.3 oz (78.5 kg)  10/05/17 174 lb (78.9 kg)    Physical Exam  Constitutional: She appears well-developed. No distress.  HENT:  Head: Normocephalic.  Right Ear: External ear normal.  Left Ear: External ear normal.  Nose: Nose normal.  Mouth/Throat: Oropharynx is clear and moist.  Eyes: Pupils are equal, round, and reactive to light. Conjunctivae are normal. Right eye exhibits no discharge. Left eye exhibits no discharge.  Neck: Normal range of motion. Neck supple. No JVD present. No tracheal deviation present. No thyromegaly present.  Cardiovascular: Normal rate, regular rhythm and normal heart sounds.  Pulmonary/Chest: No stridor. No respiratory distress. She has no wheezes.  Abdominal: Soft. Bowel sounds are normal. She exhibits no distension and no mass. There is no tenderness. There is no rebound and no guarding.  Musculoskeletal: She exhibits no edema or tenderness.  Lymphadenopathy:    She has no cervical adenopathy.  Neurological: She displays normal reflexes. No cranial nerve deficit. She exhibits normal muscle tone. Coordination normal.  Skin: No rash noted. No erythema.  Psychiatric: She has a normal mood and affect. Her behavior is normal. Judgment and thought content normal.  eryth throat  Lab Results  Component Value Date   WBC 6.6 09/19/2017   HGB 12.7 09/19/2017   HCT 37.3 09/19/2017   PLT 302 09/19/2017   GLUCOSE 100 (H) 09/19/2017  CHOL 201 (H) 02/26/2015   TRIG 65.0 02/26/2015   HDL 62.10 02/26/2015   LDLCALC 126 (H) 02/26/2015   ALT 10 03/10/2017   AST 14 03/10/2017   NA 141 09/19/2017   K 3.7 09/19/2017   CL 107 09/19/2017   CREATININE 0.67 09/19/2017   BUN 14 09/19/2017   CO2 27 09/19/2017   TSH 1.09 03/10/2017   INR 1.16 06/29/2015    Ct Cardiac Scoring  Addendum Date: 10/18/2017   ADDENDUM REPORT: 10/18/2017 10:10 CLINICAL  DATA:  Risk stratification EXAM: Coronary Calcium Score TECHNIQUE: The patient was scanned on a Siemens Somatom 64 slice scanner. Axial non-contrast 3 mm slices were carried out through the heart. The data set was analyzed on a dedicated work station and scored using the [place]. FINDINGS: Non-cardiac: See separate report from [facility]. Ascending Aorta: Normal diametr 3.0 cm Pericardium: Normal Coronary arteries: No calcium detected IMPRESSION: Coronary calcium score of 0. Jenkins Rouge Electronically Signed   By: Jenkins Rouge M.D.   On: 10/18/2017 10:10   Result Date: 10/18/2017 EXAM: OVER-READ INTERPRETATION  CT CHEST The following report is an over-read performed by radiologist Dr. Abigail Miyamoto of [facility], [place] on 10/18/2017. This over-read does not include interpretation of cardiac or coronary anatomy or pathology. The coronary calcium interpretation by the cardiologist is attached. COMPARISON:  Chest radiograph 09/19/2017.  Chest CT 06/29/2015. FINDINGS: Vascular: Normal aortic caliber. Mediastinum/Nodes: No imaged thoracic adenopathy. Tiny hiatal hernia. Lungs/Pleura: No pleural fluid. 2 mm perifissural left upper lobe pulmonary nodule on 8/3. Favored to have been present on 29/3 of 06/29/2015 exam. Most likely a subpleural lymph node. Upper Abdomen: Normal imaged portions of the liver, spleen, stomach. Musculoskeletal: No acute osseous abnormality. IMPRESSION: 1.  No acute findings in the imaged extracardiac chest. 2.  Tiny hiatal hernia. Electronically Signed: By: Abigail Miyamoto M.D. On: 10/18/2017 09:00    Assessment & Plan:   There are no diagnoses linked to this encounter.   No orders of the defined types were placed in this encounter.    Follow-up: No follow-ups on file.  Walker Kehr, MD

## 2018-01-27 NOTE — Patient Instructions (Signed)
You can use over-the-counter  "cold" medicines  such as "Tylenol cold" , "Advil cold",  "Mucinex" or" Mucinex D"  for cough and congestion.   Avoid decongestants if you have high blood pressure and use "Afrin" nasal spray for nasal congestion as directed. Use " Delsym" or" Robitussin" cough syrup varietis for cough.  You can use plain "Tylenol"  for fever, chills and achyness. Use Halls or Ricola cough drops.    Please, make an appointment if you are not better or if you're worse.

## 2018-01-27 NOTE — Assessment & Plan Note (Signed)
Ceftin x 7 d Prom-cod syr Proair MDI

## 2018-03-03 ENCOUNTER — Ambulatory Visit: Payer: BC Managed Care – PPO | Admitting: Internal Medicine

## 2018-03-03 ENCOUNTER — Encounter: Payer: Self-pay | Admitting: Internal Medicine

## 2018-03-03 ENCOUNTER — Ambulatory Visit (INDEPENDENT_AMBULATORY_CARE_PROVIDER_SITE_OTHER)
Admission: RE | Admit: 2018-03-03 | Discharge: 2018-03-03 | Disposition: A | Discharge status: Home / Self Care | Payer: BC Managed Care – PPO | Source: Ambulatory Visit | Attending: Internal Medicine | Admitting: Internal Medicine

## 2018-03-03 VITALS — BP 112/72 | HR 79 | Temp 98.3°F | Ht 64.0 in | Wt 171.0 lb

## 2018-03-03 DIAGNOSIS — J069 Acute upper respiratory infection, unspecified: Secondary | ICD-10-CM

## 2018-03-03 MED ORDER — FLUCONAZOLE 150 MG PO TABS: 150.0000 mg | ORAL_TABLET | Freq: Once | ORAL | 1 refills | Status: AC

## 2018-03-03 MED ORDER — CEFDINIR 300 MG PO CAPS: 300.0000 mg | ORAL_CAPSULE | Freq: Two times a day (BID) | ORAL | 0 refills | Status: DC

## 2018-03-03 NOTE — Patient Instructions (Signed)
You can use over-the-counter  "cold" medicines  such as "Afrin" nasal spray for nasal congestion as directed. Use " Delsym" or" Robitussin" cough syrup varietis for cough.  You can use plain "Tylenol" for fever, chills and achyness. Use Halls or Ricola cough drops.    Please, make an appointment if you are not better or if you're worse.  

## 2018-03-03 NOTE — Assessment & Plan Note (Signed)
CXR Cefdinir Diflucan if needed To work on Mon if ok

## 2018-03-03 NOTE — Progress Notes (Signed)
Subjective:  Patient ID: Regina [name], female    DOB: 10/08/1965  Age: 52 y.o. MRN: [id number]  CC: No chief complaint on file.   HPI Regina [name] presents for URI sx's since mon, bad cough, congestion  Outpatient Medications Prior to Visit  Medication Sig Dispense Refill  . albuterol (PROAIR HFA) 108 (90 Base) MCG/ACT inhaler Inhale 2 puffs into the lungs every 6 (six) hours as needed for wheezing or shortness of breath (before exercising). 1 Inhaler 6  . CASCARA SAGRADA PO Take by mouth.    . cefUROXime (CEFTIN) 500 MG tablet 1 po bid 14 tablet 1  . Cholecalciferol (GNP VITAMIN D) 1000 units tablet Take 2 tablets (2,000 Units total) by mouth daily. 100 tablet 3  . cyanocobalamin (,VITAMIN B-12,) 1000 MCG/ML injection Inject 1 mL (1,000 mcg total) into the skin every 14 (fourteen) days. 10 mL 11  . promethazine-codeine (PHENERGAN WITH CODEINE) 6.25-10 MG/5ML syrup Take 5 mLs by mouth every 4 (four) hours as needed. 300 mL 0  . Turmeric 500 MG TABS Take by mouth 2 (two) times daily.     No facility-administered medications prior to visit.     ROS: Review of Systems  Constitutional: Positive for chills and fatigue. Negative for activity change, appetite change and unexpected weight change.  HENT: Positive for congestion, postnasal drip, rhinorrhea, sinus pain and sore throat. Negative for mouth sores and sinus pressure.   Eyes: Negative for visual disturbance.  Respiratory: Positive for cough. Negative for chest tightness and wheezing.   Cardiovascular: Positive for chest pain.  Gastrointestinal: Negative for abdominal pain and nausea.  Genitourinary: Negative for difficulty urinating, frequency and vaginal pain.  Musculoskeletal: Negative for back pain and gait problem.  Skin: Negative for pallor and rash.  Neurological: Negative for dizziness, tremors, weakness, numbness and headaches.  Psychiatric/Behavioral: Negative for confusion and sleep disturbance.    Objective:    BP 112/72 (BP Location: Left Arm, Patient Position: Sitting, Cuff Size: Normal)   Pulse 79   Temp 98.3 F (36.8 C) (Oral)   Ht 5\' 4"  (1.626 m)   Wt 171 lb (77.6 kg)   SpO2 97%   BMI 29.35 kg/m   BP Readings from Last 3 Encounters:  03/03/18 112/72  01/27/18 108/72  12/14/17 102/68    Wt Readings from Last 3 Encounters:  03/03/18 171 lb (77.6 kg)  01/27/18 171 lb (77.6 kg)  12/14/17 173 lb 1.3 oz (78.5 kg)    Physical Exam  Constitutional: She appears well-developed. No distress.  HENT:  Head: Normocephalic.  Right Ear: External ear normal.  Left Ear: External ear normal.  Nose: Nose normal.  Mouth/Throat: Oropharynx is clear and moist.  Eyes: Pupils are equal, round, and reactive to light. Conjunctivae are normal. Right eye exhibits no discharge. Left eye exhibits no discharge.  Neck: Normal range of motion. Neck supple. No JVD present. No tracheal deviation present. No thyromegaly present.  Cardiovascular: Normal rate, regular rhythm and normal heart sounds.  Pulmonary/Chest: No stridor. No respiratory distress. She has no wheezes.  Abdominal: Soft. Bowel sounds are normal. She exhibits no distension and no mass. There is no tenderness. There is no rebound and no guarding.  Musculoskeletal: She exhibits no edema or tenderness.  Lymphadenopathy:    She has no cervical adenopathy.  Neurological: She displays normal reflexes. No cranial nerve deficit. She exhibits normal muscle tone. Coordination normal.  Skin: No rash noted. No erythema.  Psychiatric: She has a normal mood and affect.  Her behavior is normal. Judgment and thought content normal.  ?L base rhonchi  Lab Results  Component Value Date   WBC 6.6 09/19/2017   HGB 12.7 09/19/2017   HCT 37.3 09/19/2017   PLT 302 09/19/2017   GLUCOSE 100 (H) 09/19/2017   CHOL 201 (H) 02/26/2015   TRIG 65.0 02/26/2015   HDL 62.10 02/26/2015   LDLCALC 126 (H) 02/26/2015   ALT 10 03/10/2017   AST 14 03/10/2017   NA 141  09/19/2017   K 3.7 09/19/2017   CL 107 09/19/2017   CREATININE 0.67 09/19/2017   BUN 14 09/19/2017   CO2 27 09/19/2017   TSH 1.09 03/10/2017   INR 1.16 06/29/2015    Ct Cardiac Scoring  Addendum Date: 10/18/2017   ADDENDUM REPORT: 10/18/2017 10:10 CLINICAL DATA:  Risk stratification EXAM: Coronary Calcium Score TECHNIQUE: The patient was scanned on a Siemens Somatom 64 slice scanner. Axial non-contrast 3 mm slices were carried out through the heart. The data set was analyzed on a dedicated work station and scored using the [place]. FINDINGS: Non-cardiac: See separate report from [facility]. Ascending Aorta: Normal diametr 3.0 cm Pericardium: Normal Coronary arteries: No calcium detected IMPRESSION: Coronary calcium score of 0. Jenkins Rouge Electronically Signed   By: Jenkins Rouge M.D.   On: 10/18/2017 10:10   Result Date: 10/18/2017 EXAM: OVER-READ INTERPRETATION  CT CHEST The following report is an over-read performed by radiologist Dr. Abigail Miyamoto of [facility], [place] on 10/18/2017. This over-read does not include interpretation of cardiac or coronary anatomy or pathology. The coronary calcium interpretation by the cardiologist is attached. COMPARISON:  Chest radiograph 09/19/2017.  Chest CT 06/29/2015. FINDINGS: Vascular: Normal aortic caliber. Mediastinum/Nodes: No imaged thoracic adenopathy. Tiny hiatal hernia. Lungs/Pleura: No pleural fluid. 2 mm perifissural left upper lobe pulmonary nodule on 8/3. Favored to have been present on 29/3 of 06/29/2015 exam. Most likely a subpleural lymph node. Upper Abdomen: Normal imaged portions of the liver, spleen, stomach. Musculoskeletal: No acute osseous abnormality. IMPRESSION: 1.  No acute findings in the imaged extracardiac chest. 2.  Tiny hiatal hernia. Electronically Signed: By: Abigail Miyamoto M.D. On: 10/18/2017 09:00    Assessment & Plan:   There are no diagnoses linked to this encounter.   No orders of the defined types  were placed in this encounter.    Follow-up: No follow-ups on file.  Walker Kehr, MD

## 2018-03-16 ENCOUNTER — Ambulatory Visit (INDEPENDENT_AMBULATORY_CARE_PROVIDER_SITE_OTHER): Payer: BC Managed Care – PPO | Admitting: Internal Medicine

## 2018-03-16 ENCOUNTER — Encounter: Payer: Self-pay | Admitting: Internal Medicine

## 2018-03-16 VITALS — BP 124/76 | HR 67 | Temp 99.0°F | Ht 64.0 in | Wt 174.0 lb

## 2018-03-16 DIAGNOSIS — E6609 Other obesity due to excess calories: Secondary | ICD-10-CM | POA: Diagnosis not present

## 2018-03-16 DIAGNOSIS — E669 Obesity, unspecified: Secondary | ICD-10-CM | POA: Insufficient documentation

## 2018-03-16 DIAGNOSIS — E538 Deficiency of other specified B group vitamins: Secondary | ICD-10-CM | POA: Diagnosis not present

## 2018-03-16 DIAGNOSIS — Z0000 Encounter for general adult medical examination without abnormal findings: Secondary | ICD-10-CM | POA: Insufficient documentation

## 2018-03-16 MED ORDER — CYANOCOBALAMIN 1000 MCG/ML IJ SOLN
1000.0000 ug | Freq: Once | INTRAMUSCULAR | Status: AC
  Administered 2018-03-16: 1000 ug via INTRAMUSCULAR

## 2018-03-16 MED ORDER — LORCASERIN HCL 10 MG PO TABS: 1.0000 | ORAL_TABLET | Freq: Two times a day (BID) | ORAL | 5 refills | Status: DC

## 2018-03-16 NOTE — Addendum Note (Signed)
Addended by: Karren Cobble on: 03/16/2018 08:56 AM   Modules accepted: Orders

## 2018-03-16 NOTE — Assessment & Plan Note (Addendum)
BMI 29.3 Belviq Rx 12/19 Fast 1 d/week - unable

## 2018-03-16 NOTE — Assessment & Plan Note (Signed)
On B12 

## 2018-03-16 NOTE — Progress Notes (Signed)
Subjective:  Patient ID: Regina [name], female    DOB: [birth year]  Age: 52 y.o. MRN: [id number]  CC: No chief complaint on file.   HPI Regina [name] presents for a well exam URI sx's are better Starting a new job in [employer] in [place] in [place] wt gain  Outpatient Medications Prior to Visit  Medication Sig Dispense Refill  . albuterol (PROAIR HFA) 108 (90 Base) MCG/ACT inhaler Inhale 2 puffs into the lungs every 6 (six) hours as needed for wheezing or shortness of breath (before exercising). 1 Inhaler 6  . CASCARA SAGRADA PO Take by mouth.    . cefdinir (OMNICEF) 300 MG capsule Take 1 capsule (300 mg total) by mouth 2 (two) times daily. 20 capsule 0  . Cholecalciferol (GNP VITAMIN D) 1000 units tablet Take 2 tablets (2,000 Units total) by mouth daily. 100 tablet 3  . cyanocobalamin (,VITAMIN B-12,) 1000 MCG/ML injection Inject 1 mL (1,000 mcg total) into the skin every 14 (fourteen) days. 10 mL 11  . promethazine-codeine (PHENERGAN WITH CODEINE) 6.25-10 MG/5ML syrup Take 5 mLs by mouth every 4 (four) hours as needed. 300 mL 0  . Turmeric 500 MG TABS Take by mouth 2 (two) times daily.     No facility-administered medications prior to visit.     ROS: Review of Systems  Constitutional: Negative for activity change, appetite change, chills, fatigue and unexpected weight change.  HENT: Negative for congestion, mouth sores and sinus pressure.   Eyes: Negative for visual disturbance.  Respiratory: Negative for cough and chest tightness.   Gastrointestinal: Negative for abdominal pain and nausea.  Genitourinary: Negative for difficulty urinating, frequency and vaginal pain.  Musculoskeletal: Positive for back pain and neck pain. Negative for gait problem.  Skin: Negative for pallor and rash.  Neurological: Negative for dizziness, tremors, weakness, numbness and headaches.  Psychiatric/Behavioral: Negative for confusion and sleep disturbance.    Objective:    There were no vitals taken for this visit.  BP Readings from Last 3 Encounters:  03/03/18 112/72  01/27/18 108/72  12/14/17 102/68    Wt Readings from Last 3 Encounters:  03/03/18 171 lb (77.6 kg)  01/27/18 171 lb (77.6 kg)  12/14/17 173 lb 1.3 oz (78.5 kg)    Physical Exam Constitutional:      General: She is not in acute distress.    Appearance: She is well-developed.  HENT:     Head: Normocephalic.     Right Ear: External ear normal.     Left Ear: External ear normal.     Nose: Nose normal.  Eyes:     General:        Right eye: No discharge.        Left eye: No discharge.     Conjunctiva/sclera: Conjunctivae normal.     Pupils: Pupils are equal, round, and reactive to light.  Neck:     Musculoskeletal: Normal range of motion and neck supple.     Thyroid: No thyromegaly.     Vascular: No JVD.     Trachea: No tracheal deviation.  Cardiovascular:     Rate and Rhythm: Normal rate and regular rhythm.     Heart sounds: Normal heart sounds.  Pulmonary:     Effort: No respiratory distress.     Breath sounds: No stridor. No wheezing.  Abdominal:     General: Bowel sounds are normal. There is no distension.     Palpations: Abdomen is soft. There  is no mass.     Tenderness: There is no abdominal tenderness. There is no guarding or rebound.  Musculoskeletal:        General: Tenderness present.  Lymphadenopathy:     Cervical: No cervical adenopathy.  Skin:    Findings: No erythema or rash.  Neurological:     Cranial Nerves: No cranial nerve deficit.     Motor: No abnormal muscle tone.     Coordination: Coordination normal.     Deep Tendon Reflexes: Reflexes normal.  Psychiatric:        Behavior: Behavior normal.        Thought Content: Thought content normal.        Judgment: Judgment normal.     Lab Results  Component Value Date   WBC 6.6 09/19/2017   HGB 12.7 09/19/2017   HCT 37.3 09/19/2017   PLT 302 09/19/2017   GLUCOSE 100 (H) 09/19/2017   CHOL 201  (H) 02/26/2015   TRIG 65.0 02/26/2015   HDL 62.10 02/26/2015   LDLCALC 126 (H) 02/26/2015   ALT 10 03/10/2017   AST 14 03/10/2017   NA 141 09/19/2017   K 3.7 09/19/2017   CL 107 09/19/2017   CREATININE 0.67 09/19/2017   BUN 14 09/19/2017   CO2 27 09/19/2017   TSH 1.09 03/10/2017   INR 1.16 06/29/2015    Dg Chest 2 View  Result Date: 03/04/2018 CLINICAL DATA:  Cough, LEFT-sided lower chest pain for 1 week. History of pneumonia. EXAM: CHEST - 2 VIEW COMPARISON:  Chest x-ray dated 09/19/2017. FINDINGS: Heart size and mediastinal contours are within normal limits. Lungs are clear. No pleural effusion or pneumothorax seen. No acute or suspicious osseous finding. IMPRESSION: No active cardiopulmonary disease.  No evidence of pneumonia. Electronically Signed   By: Franki Cabot M.D.   On: 03/04/2018 07:40    Assessment & Plan:   There are no diagnoses linked to this encounter.   No orders of the defined types were placed in this encounter.    Follow-up: No follow-ups on file.  Walker Kehr, MD

## 2018-03-16 NOTE — Assessment & Plan Note (Signed)
We discussed age appropriate health related issues, including available/recomended screening tests and vaccinations. We discussed a need for adhering to healthy diet and exercise. Labs were ordered to be later reviewed . All questions were answered.   

## 2018-06-09 ENCOUNTER — Telehealth: Payer: Self-pay | Admitting: Interventional Cardiology

## 2018-06-09 NOTE — Telephone Encounter (Signed)
Unable to reach pt or leave a message mailbox is full 

## 2018-06-09 NOTE — Telephone Encounter (Signed)
Pt c/o swelling: STAT is pt has developed SOB within 24 hours  1) How much weight have you gained and in what time span? She doesn't know  2) If swelling, where is the swelling located? Hands and feet  3) Are you currently taking a fluid pill? no  4) Are you currently SOB? no  5) Do you have a log of your daily weights (if so, list)?   6) Have you gained 3 pounds in a day or 5 pounds in a week? unsure  7) Have you traveled recently? No  Pt c/o of Chest Pain: STAT if CP now or developed within 24 hours  1. Are you having CP right now? no  2. Are you experiencing any other symptoms (ex. SOB, nausea, vomiting, sweating)? no  3. How long have you been experiencing CP? Last two days  4. Is your CP continuous or coming and going? Comes and goes  5. Have you taken Nitroglycerin? no ?

## 2018-06-13 NOTE — Telephone Encounter (Signed)
Spoke with pt, she had one episode of waking up with a tight band around her chest and severe pain under the left shoulder blade. It lasted about 30 min and then went away. She has not had it again and she states it was different than her usual pain. She fell in the past and broke her back and reports it is not uncommon to wake up with pain in her back. Reassurance given to the patient her calcium score was 0. She also c/o holding onto fluid, she denies any SOB. She was encouraged to follow up with her medical doctor and Pt agreed with this plan.

## 2018-06-20 ENCOUNTER — Ambulatory Visit: Payer: BC Managed Care – PPO | Admitting: Neurology

## 2018-07-06 ENCOUNTER — Other Ambulatory Visit: Payer: Self-pay

## 2018-07-06 ENCOUNTER — Ambulatory Visit: Payer: BC Managed Care – PPO | Admitting: Family Medicine

## 2018-07-06 ENCOUNTER — Encounter: Payer: Self-pay | Admitting: Family Medicine

## 2018-07-06 DIAGNOSIS — M5412 Radiculopathy, cervical region: Secondary | ICD-10-CM | POA: Diagnosis not present

## 2018-07-06 DIAGNOSIS — M5410 Radiculopathy, site unspecified: Secondary | ICD-10-CM | POA: Insufficient documentation

## 2018-07-06 MED ORDER — GABAPENTIN 300 MG PO CAPS: ORAL_CAPSULE | ORAL | 3 refills | Status: DC

## 2018-07-06 NOTE — Assessment & Plan Note (Signed)
Right-sided brachial neuritis.  Patient does not have a positive Spurling's.  Seems to be worse when she side bends to the left.  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  Discussed different medications.  Gabapentin started.  Patient has been some noncompliant in the past previously.  Will avoid prednisone if possible secondary to corona outbreak.  Discussed icing regimen and home exercise.  Follow-up again in 4 to 8 weeks

## 2018-07-06 NOTE — Progress Notes (Signed)
[name] [place], [place] [address] Phone: [phone] Subjective:   [name], am serving as a scribe for Dr. Hulan Saas.   CC: Right shoulder, neck, elbow pain  [id number]  Regina [name] is a 53 y.o. female coming in with complaint of neck and shoulder pain on the right side. States that she "likely" did more with her arms on Sunday when her pain increased. Patient is having pain that is keeping her up at night. Pain is sharp. Feels like neck is tight. Denies any radiating symptoms. Feels like her right arm is cramping. Patient has been having headaches for 3 months now when she arises in the morning. Is having tingling in the right side of face.        Patient has had an EMG done and it was unremarkable.  Past Medical History:  Diagnosis Date  . Allergic urticaria   . ARTHRALGIA   . Arthritis   . Atrial fibrillation ([place])   . B12 DEFICIENCY   . CFS (chronic fatigue syndrome)   . Endometriosis   . Epidural hemorrhage with loss of consciousness ([place]) 06/29/15  . Fibroid   . Fibromyalgia   . Fracture of cervical vertebra, C6 ([place]) 06/29/15  . Gastritis   . GERD   . Heart murmur   . History of endometriosis   . IBS (irritable bowel syndrome)   . Internal hemorrhoids   . MVP (mitral valve prolapse)   . Osteoarthritis   . Rosacea   . Thoracic compression fracture ([place]) 06/29/15   T2-T6   Past Surgical History:  Procedure Laterality Date  . ABDOMINAL HYSTERECTOMY     partial  . ABDOMINAL HYSTERECTOMY    . ANAL FISSURE REPAIR     Dr Rise [name] 2009  . APPENDECTOMY    . COLPOSCOPY    . HEMORRHOID SURGERY     Dr. Rise [name] 2009  . OVARIAN CYST REMOVAL    . RT BUNIONECTOMT    . TONSILLECTOMY AND ADENOIDECTOMY    . TUBAL LIGATION    . UTERINE FIBROID SURGERY     Social History   Socioeconomic History  . Marital status: Married    Spouse name: Not on file  . Number of children: Not on file  . Years of  education: Not on file  . Highest education level: Not on file  Occupational History  . Not on file  Social Needs  . Financial resource strain: Not on file  . Food insecurity:    Worry: Not on file    Inability: Not on file  . Transportation needs:    Medical: Not on file    Non-medical: Not on file  Tobacco Use  . Smoking status: Former Smoker    Last attempt to quit: 10/31/1988    Years since quitting: 29.6  . Smokeless tobacco: Never Used  Substance and Sexual Activity  . Alcohol use: Yes    Alcohol/week: 1.0 - 2.0 standard drinks    Types: 1 - 2 Standard drinks or equivalent per week    Comment: occasional   . Drug use: No  . Sexual activity: Yes    Partners: Male    Birth control/protection: Surgical  Lifestyle  . Physical activity:    Days per week: Not on file    Minutes per session: Not on file  . Stress: Not on file  Relationships  . Social connections:    Talks on phone: Not on file  Gets together: Not on file    Attends religious service: Not on file    Active member of club or organization: Not on file    Attends meetings of clubs or organizations: Not on file    Relationship status: Not on file  Other Topics Concern  . Not on file  Social History Narrative   ** Merged History Encounter **       Working 2 jobs, 6d/wk      Regular exercise-no      Divorced   Allergies  Allergen Reactions  . Indocin [Indomethacin] Swelling, Rash and Other (See Comments)    REACTION: lupus like illness; butterfly rash, pain and swelling all over body.   . Morphine Sulfate Nausea And Vomiting  . Sulfacetamide Sodium Swelling  . Caffeine Other (See Comments)    Induces A-fib  . Morphine And Related     nausea  . Sulfa Antibiotics Swelling  . Azithromycin Rash    ALL MYCIN DRUGS  . Erythromycin Stearate Hives and Swelling    REACTION: all mycins   Family History  Problem Relation Age of Onset  . Lung cancer Mother   . Thyroid cancer Mother   . Cancer Mother         INTESTINAL  . Healthy Father   . Diabetes Unknown        uncles/aunts  . Von Willebrand disease Daughter   . Asthma Neg Hx       Current Outpatient Medications (Respiratory):  .  albuterol (PROAIR HFA) 108 (90 Base) MCG/ACT inhaler, Inhale 2 puffs into the lungs every 6 (six) hours as needed for wheezing or shortness of breath (before exercising). .  promethazine-codeine (PHENERGAN WITH CODEINE) 6.25-10 MG/5ML syrup, Take 5 mLs by mouth every 4 (four) hours as needed.   Current Outpatient Medications (Hematological):  .  cyanocobalamin (,VITAMIN B-12,) 1000 MCG/ML injection, Inject 1 mL (1,000 mcg total) into the skin every 14 (fourteen) days.  Current Outpatient Medications (Other):  Marland Kitchen  CASCARA SAGRADA PO, Take by mouth. .  cefdinir (OMNICEF) 300 MG capsule, Take 1 capsule (300 mg total) by mouth 2 (two) times daily. .  Cholecalciferol (GNP VITAMIN D) 1000 units tablet, Take 2 tablets (2,000 Units total) by mouth daily. .  Lorcaserin HCl (BELVIQ) 10 MG TABS, Take 1 tablet by mouth 2 (two) times daily. .  Turmeric 500 MG TABS, Take by mouth 2 (two) times daily. Marland Kitchen  gabapentin (NEURONTIN) 300 MG capsule, nightly    Past medical history, social, surgical and family history all reviewed in electronic medical record.  No pertanent information unless stated regarding to the chief complaint.   Review of Systems:  No headache, visual changes, nausea, vomiting, diarrhea, constipation, dizziness, abdominal pain, skin rash, fevers, chills, night sweats, weight loss, swollen lymph nodes, body aches, joint swelling, chest pain, shortness of breath, mood changes.  Positive muscle aches  Objective  Blood pressure 102/72, pulse 75, height 5\' 4"  (1.626 m), weight 173 lb (78.5 kg), SpO2 97 %. S   General: No apparent distress alert and oriented x3 mood and affect normal, dressed appropriately.  HEENT: Pupils equal, extraocular movements intact  Respiratory: Patient's speak in full  sentences and does not appear short of breath  Cardiovascular: No lower extremity edema, non tender, no erythema  Skin: Warm dry intact with no signs of infection or rash on extremities or on axial skeleton.  Abdomen: Soft nontender  Neuro: Cranial nerves II through XII are intact, neurovascularly intact in all  extremities with 2+ DTRs and 2+ pulses.  Lymph: No lymphadenopathy of posterior or anterior cervical chain or axillae bilaterally.  Gait normal with good balance and coordination.  MSK:  Non tender with full range of motion and good stability and symmetric strength and tone ofhip, knee and ankles bilaterally.   Neck exam shows the patient does have near full range of motion.  Patient does have low pain of the right side of the neck when sidebending to the left.  Negative Spurling's.  Appears to be neurovascularly intact distally.    Impression and Recommendations:     This case required medical decision making of moderate complexity. The above documentation has been reviewed and is accurate and complete Lyndal Pulley, DO       Note: This dictation was prepared with Dragon dictation along with smaller phrase technology. Any transcriptional errors that result from this process are unintentional.

## 2018-07-06 NOTE — Patient Instructions (Addendum)
Good to see you  Seems to be all nerve related  Ice 20 minutes 2 times daily. Usually after activity and before bed. Exercises 3 times a week.  Gabapentin 300mg  at night Will hold on the anti-inflammatory because of your allergy  If not much better in 2-3 weeks give me a call

## 2018-07-14 ENCOUNTER — Encounter: Payer: Self-pay | Admitting: Internal Medicine

## 2018-08-15 ENCOUNTER — Ambulatory Visit: Payer: BC Managed Care – PPO | Admitting: Obstetrics and Gynecology

## 2018-08-15 NOTE — Progress Notes (Deleted)
53 y.o. [id number] Married White or Caucasian Not Hispanic or Latino female here for annual exam.      No LMP recorded. Patient has had a hysterectomy.          Sexually active: {yes no:314532}  The current method of family planning is {contraception:315051}.    Exercising: {yes no:314532}  {types:19826} Smoker:  {YES NO:22349}  Health Maintenance: Pap:  03-31-16 LGSIL HPB effect + HR HPV/ 08-05-15 LGSIL may include HPV or mild dysplasia  History of abnormal Pap:  yes MMG:  07-01-16 WNL  Colonoscopy:  12-18-11 WNL BMD:   Years ago TDaP:  06-29-15 Gardasil: N/A   reports that she quit smoking about 29 years ago. She has never used smokeless tobacco. She reports current alcohol use of about 1.0 - 2.0 standard drinks of alcohol per week. She reports that she does not use drugs.  Past Medical History:  Diagnosis Date  . Allergic urticaria   . ARTHRALGIA   . Arthritis   . Atrial fibrillation ([place])   . B12 DEFICIENCY   . CFS (chronic fatigue syndrome)   . Endometriosis   . Epidural hemorrhage with loss of consciousness ([place]) 06/29/15  . Fibroid   . Fibromyalgia   . Fracture of cervical vertebra, C6 ([place]) 06/29/15  . Gastritis   . GERD   . Heart murmur   . History of endometriosis   . IBS (irritable bowel syndrome)   . Internal hemorrhoids   . MVP (mitral valve prolapse)   . Osteoarthritis   . Rosacea   . Thoracic compression fracture ([place]) 06/29/15   T2-T6    Past Surgical History:  Procedure Laterality Date  . ABDOMINAL HYSTERECTOMY     partial  . ABDOMINAL HYSTERECTOMY    . ANAL FISSURE REPAIR     Dr Rise [name] 2009  . APPENDECTOMY    . COLPOSCOPY    . HEMORRHOID SURGERY     Dr. Rise [name] 2009  . OVARIAN CYST REMOVAL    . RT BUNIONECTOMT    . TONSILLECTOMY AND ADENOIDECTOMY    . TUBAL LIGATION    . UTERINE FIBROID SURGERY      Current Outpatient Medications  Medication Sig Dispense Refill  . albuterol (PROAIR HFA) 108 (90 Base) MCG/ACT inhaler Inhale 2 puffs into the lungs  every 6 (six) hours as needed for wheezing or shortness of breath (before exercising). 1 Inhaler 6  . CASCARA SAGRADA PO Take by mouth.    . cefdinir (OMNICEF) 300 MG capsule Take 1 capsule (300 mg total) by mouth 2 (two) times daily. 20 capsule 0  . Cholecalciferol (GNP VITAMIN D) 1000 units tablet Take 2 tablets (2,000 Units total) by mouth daily. 100 tablet 3  . cyanocobalamin (,VITAMIN B-12,) 1000 MCG/ML injection Inject 1 mL (1,000 mcg total) into the skin every 14 (fourteen) days. 10 mL 11  . gabapentin (NEURONTIN) 300 MG capsule nightly 30 capsule 3  . Lorcaserin HCl (BELVIQ) 10 MG TABS Take 1 tablet by mouth 2 (two) times daily. 60 tablet 5  . promethazine-codeine (PHENERGAN WITH CODEINE) 6.25-10 MG/5ML syrup Take 5 mLs by mouth every 4 (four) hours as needed. 300 mL 0  . Turmeric 500 MG TABS Take by mouth 2 (two) times daily.     No current facility-administered medications for this visit.     Family History  Problem Relation Age of Onset  . Lung cancer Mother   . Thyroid cancer Mother   . Cancer Mother  INTESTINAL  . Healthy Father   . Diabetes Unknown        uncles/aunts  . Von Willebrand disease Daughter   . Asthma Neg Hx     Review of Systems  Exam:   There were no vitals taken for this visit.  Weight change: @WEIGHTCHANGE @ Height:      Ht Readings from Last 3 Encounters:  07/06/18 5\' 4"  (1.626 m)  03/16/18 5\' 4"  (1.626 m)  03/03/18 5\' 4"  (1.626 m)    General appearance: alert, cooperative and appears stated age Head: Normocephalic, without obvious abnormality, atraumatic Neck: no adenopathy, supple, symmetrical, trachea midline and thyroid {CHL AMB PHY EX THYROID NORM DEFAULT:[phone]::"normal to inspection and palpation"} Lungs: clear to auscultation bilaterally Cardiovascular: regular rate and rhythm Breasts: {Exam; breast:13139::"normal appearance, no masses or tenderness"} Abdomen: soft, non-tender; non distended,  no masses,  no  organomegaly Extremities: extremities normal, atraumatic, no cyanosis or edema Skin: Skin color, texture, turgor normal. No rashes or lesions Lymph nodes: Cervical, supraclavicular, and axillary nodes normal. No abnormal inguinal nodes palpated Neurologic: Grossly normal   Pelvic: External genitalia:  no lesions              Urethra:  normal appearing urethra with no masses, tenderness or lesions              Bartholins and Skenes: normal                 Vagina: normal appearing vagina with normal color and discharge, no lesions              Cervix: {CHL AMB PHY EX CERVIX NORM [phone]::"no lesions"}               Bimanual Exam:  Uterus:  {CHL AMB PHY EX UTERUS NORM DEFAULT:[phone]::"normal size, contour, position, consistency, mobility, non-tender"}              Adnexa: {CHL AMB PHY EX ADNEXA NO MASS DEFAULT:[phone]::"no mass, fullness, tenderness"}               Rectovaginal: Confirms               Anus:  normal sphincter tone, no lesions  Chaperone was present for exam.  A:  Well Woman with normal exam  P:

## 2018-08-18 ENCOUNTER — Other Ambulatory Visit: Payer: Self-pay

## 2018-08-23 ENCOUNTER — Encounter: Payer: Self-pay | Admitting: Obstetrics and Gynecology

## 2018-08-23 ENCOUNTER — Ambulatory Visit: Payer: BC Managed Care – PPO | Admitting: Obstetrics and Gynecology

## 2018-08-23 ENCOUNTER — Other Ambulatory Visit: Payer: Self-pay

## 2018-08-23 VITALS — BP 90/64 | HR 72 | Temp 97.6°F | Ht 64.0 in | Wt 176.0 lb

## 2018-08-23 DIAGNOSIS — Z0000 Encounter for general adult medical examination without abnormal findings: Secondary | ICD-10-CM | POA: Diagnosis not present

## 2018-08-23 DIAGNOSIS — Z6830 Body mass index (BMI) 30.0-30.9, adult: Secondary | ICD-10-CM

## 2018-08-23 DIAGNOSIS — E559 Vitamin D deficiency, unspecified: Secondary | ICD-10-CM

## 2018-08-23 DIAGNOSIS — E538 Deficiency of other specified B group vitamins: Secondary | ICD-10-CM

## 2018-08-23 DIAGNOSIS — Z8489 Family history of other specified conditions: Secondary | ICD-10-CM

## 2018-08-23 DIAGNOSIS — N951 Menopausal and female climacteric states: Secondary | ICD-10-CM | POA: Diagnosis not present

## 2018-08-23 DIAGNOSIS — Z01419 Encounter for gynecological examination (general) (routine) without abnormal findings: Secondary | ICD-10-CM

## 2018-08-23 DIAGNOSIS — Z1272 Encounter for screening for malignant neoplasm of vagina: Secondary | ICD-10-CM

## 2018-08-23 DIAGNOSIS — E2839 Other primary ovarian failure: Secondary | ICD-10-CM

## 2018-08-23 NOTE — Patient Instructions (Signed)
EXERCISE AND DIET:  We recommended that you start or continue a regular exercise program for good health. Regular exercise means any activity that makes your heart beat faster and makes you sweat.  We recommend exercising at least 30 minutes per day at least 3 days a week, preferably 4 or 5.  We also recommend a diet low in fat and sugar.  Inactivity, poor dietary choices and obesity can cause diabetes, heart attack, stroke, and kidney damage, among others.    ALCOHOL AND SMOKING:  Women should limit their alcohol intake to no more than 7 drinks/beers/glasses of wine (combined, not each!) per week. Moderation of alcohol intake to this level decreases your risk of breast cancer and liver damage. And of course, no recreational drugs are part of a healthy lifestyle.  And absolutely no smoking or even second hand smoke. Most people know smoking can cause heart and lung diseases, but did you know it also contributes to weakening of your bones? Aging of your skin?  Yellowing of your teeth and nails?  CALCIUM AND VITAMIN D:  Adequate intake of calcium and Vitamin D are recommended.  The recommendations for exact amounts of these supplements seem to change often, but generally speaking 1,200 mg of calcium (between diet and supplement) and 800 units of Vitamin D per day seems prudent. Certain women may benefit from higher intake of Vitamin D.  If you are among these women, your doctor will have told you during your visit.    PAP SMEARS:  Pap smears, to check for cervical cancer or precancers,  have traditionally been done yearly, although recent scientific advances have shown that most women can have pap smears less often.  However, every woman still should have a physical exam from her gynecologist every year. It will include a breast check, inspection of the vulva and vagina to check for abnormal growths or skin changes, a visual exam of the cervix, and then an exam to evaluate the size and shape of the uterus and  ovaries.  And after 53 years of age, a rectal exam is indicated to check for rectal cancers. We will also provide age appropriate advice regarding health maintenance, like when you should have certain vaccines, screening for sexually transmitted diseases, bone density testing, colonoscopy, mammograms, etc.   MAMMOGRAMS:  All women over 40 years old should have a yearly mammogram. Many facilities now offer a "3D" mammogram, which may cost around $50 extra out of pocket. If possible,  we recommend you accept the option to have the 3D mammogram performed.  It both reduces the number of women who will be called back for extra views which then turn out to be normal, and it is better than the routine mammogram at detecting truly abnormal areas.    COLON CANCER SCREENING: Now recommend starting at age 45. At this time colonoscopy is not covered for routine screening until 50. There are take home tests that can be done between 45-49.   COLONOSCOPY:  Colonoscopy to screen for colon cancer is recommended for all women at age 50.  We know, you hate the idea of the prep.  We agree, BUT, having colon cancer and not knowing it is worse!!  Colon cancer so often starts as a polyp that can be seen and removed at colonscopy, which can quite literally save your life!  And if your first colonoscopy is normal and you have no family history of colon cancer, most women don't have to have it again for   10 years.  Once every ten years, you can do something that may end up saving your life, right?  We will be happy to help you get it scheduled when you are ready.  Be sure to check your insurance coverage so you understand how much it will cost.  It may be covered as a preventative service at no cost, but you should check your particular policy.      Breast Self-Awareness Breast self-awareness means being familiar with how your breasts look and feel. It involves checking your breasts regularly and reporting any changes to your  health care provider. Practicing breast self-awareness is important. A change in your breasts can be a sign of a serious medical problem. Being familiar with how your breasts look and feel allows you to find any problems early, when treatment is more likely to be successful. All women should practice breast self-awareness, including women who have had breast implants. How to do a breast self-exam One way to learn what is normal for your breasts and whether your breasts are changing is to do a breast self-exam. To do a breast self-exam: Look for Changes  1. Remove all the clothing above your waist. 2. Stand in front of a mirror in a room with good lighting. 3. Put your hands on your hips. 4. Push your hands firmly downward. 5. Compare your breasts in the mirror. Look for differences between them (asymmetry), such as: ? Differences in shape. ? Differences in size. ? Puckers, dips, and bumps in one breast and not the other. 6. Look at each breast for changes in your skin, such as: ? Redness. ? Scaly areas. 7. Look for changes in your nipples, such as: ? Discharge. ? Bleeding. ? Dimpling. ? Redness. ? A change in position. Feel for Changes Carefully feel your breasts for lumps and changes. It is best to do this while lying on your back on the floor and again while sitting or standing in the shower or tub with soapy water on your skin. Feel each breast in the following way:  Place the arm on the side of the breast you are examining above your head.  Feel your breast with the other hand.  Start in the nipple area and make  inch (2 cm) overlapping circles to feel your breast. Use the pads of your three middle fingers to do this. Apply light pressure, then medium pressure, then firm pressure. The light pressure will allow you to feel the tissue closest to the skin. The medium pressure will allow you to feel the tissue that is a little deeper. The firm pressure will allow you to feel the tissue  close to the ribs.  Continue the overlapping circles, moving downward over the breast until you feel your ribs below your breast.  Move one finger-width toward the center of the body. Continue to use the  inch (2 cm) overlapping circles to feel your breast as you move slowly up toward your collarbone.  Continue the up and down exam using all three pressures until you reach your armpit.  Write Down What You Find  Write down what is normal for each breast and any changes that you find. Keep a written record with breast changes or normal findings for each breast. By writing this information down, you do not need to depend only on memory for size, tenderness, or location. Write down where you are in your menstrual cycle, if you are still menstruating. If you are having trouble noticing differences   in your breasts, do not get discouraged. With time you will become more familiar with the variations in your breasts and more comfortable with the exam. How often should I examine my breasts? Examine your breasts every month. If you are breastfeeding, the best time to examine your breasts is after a feeding or after using a breast pump. If you menstruate, the best time to examine your breasts is 5-7 days after your period is over. During your period, your breasts are lumpier, and it may be more difficult to notice changes. When should I see my health care provider? See your health care provider if you notice:  A change in shape or size of your breasts or nipples.  A change in the skin of your breast or nipples, such as a reddened or scaly area.  Unusual discharge from your nipples.  A lump or thick area that was not there before.  Pain in your breasts.  Anything that concerns you.  

## 2018-08-23 NOTE — Progress Notes (Signed)
53 y.o. [id number] Married White or Caucasian Not Hispanic or Latino female here for annual exam.   She is unhappy with her weight. She has gained 50 lbs since her accident in 2017. She doesn't have the will power to exercise and eat healthy. Doesn't feel depressed.  She has tolerable vasomotor symptoms. She has some vaginal dryness, lubricant helps.   H/O hysterectomy and vaginal dysplasia. In 2018, LSIL pap with hpv, colposcopy with hpv effect, no dysplasia. Pap last year was negative with negative hpv.    No LMP recorded. Patient has had a hysterectomy.          Sexually active: Yes.    The current method of family planning is status post hysterectomy.    Exercising: No.  The patient does not participate in regular exercise at present. Smoker:  no  Health Maintenance: Pap:  07/23/2017 WNL NEG HPV, 03-31-16 LGSIL HPV effect + HR HPV, 08-05-15 LGSIL may include HPV or mild dysplasia  History of abnormal Pap:  yes MMG:  07-01-16 Birads 1 negative Colonoscopy:  12-18-11 WNL, f/u 10 years.  BMD:   Years ago, WNL per patient. Mom with bilateral hip fractures (multiple fractures).  TDaP:  06-29-15 Gardasil: N/A    reports that she quit smoking about 29 years ago. She has never used smokeless tobacco. She reports current alcohol use of about 1.0 - 2.0 standard drinks of alcohol per week. She reports that she does not use drugs. She is a high school Music therapist. 2 kids, 3 grandkids  Past Medical History:  Diagnosis Date  . Allergic urticaria   . ARTHRALGIA   . Arthritis   . Atrial fibrillation ([place])   . B12 DEFICIENCY   . CFS (chronic fatigue syndrome)   . Endometriosis   . Epidural hemorrhage with loss of consciousness ([place]) 06/29/15  . Fibroid   . Fibromyalgia   . Fracture of cervical vertebra, C6 ([place]) 06/29/15  . Gastritis   . GERD   . Heart murmur   . History of endometriosis   . IBS (irritable bowel syndrome)   . Internal hemorrhoids   . MVP (mitral valve prolapse)   . Osteoarthritis   .  Rosacea   . Skin cancer of arm, right   . Thoracic compression fracture ([place]) 06/29/15   T2-T6    Past Surgical History:  Procedure Laterality Date  . ABDOMINAL HYSTERECTOMY     partial  . ABDOMINAL HYSTERECTOMY    . ANAL FISSURE REPAIR     Dr Rise [name] 2009  . APPENDECTOMY    . COLPOSCOPY    . HEMORRHOID SURGERY     Dr. Rise [name] 2009  . OVARIAN CYST REMOVAL    . RT BUNIONECTOMT    . TONSILLECTOMY AND ADENOIDECTOMY    . TUBAL LIGATION    . UTERINE FIBROID SURGERY      Current Outpatient Medications  Medication Sig Dispense Refill  . albuterol (PROAIR HFA) 108 (90 Base) MCG/ACT inhaler Inhale 2 puffs into the lungs every 6 (six) hours as needed for wheezing or shortness of breath (before exercising). 1 Inhaler 6  . CASCARA SAGRADA PO Take by mouth.    . Cholecalciferol (GNP VITAMIN D) 1000 units tablet Take 2 tablets (2,000 Units total) by mouth daily. 100 tablet 3  . gabapentin (NEURONTIN) 300 MG capsule nightly 30 capsule 3  . Turmeric 500 MG TABS Take by mouth 2 (two) times daily.    . cyanocobalamin (,VITAMIN B-12,) 1000 MCG/ML injection Inject 1 mL (1,000  mcg total) into the skin every 14 (fourteen) days. 10 mL 11   No current facility-administered medications for this visit.     Family History  Problem Relation Age of Onset  . Lung cancer Mother   . Thyroid cancer Mother   . Cancer Mother        INTESTINAL  . Healthy Father   . Diabetes Other        uncles/aunts  . Von Willebrand disease Daughter   . Asthma Neg Hx     Review of Systems  Constitutional: Negative.   HENT: Negative.   Eyes: Negative.   Respiratory: Negative.   Cardiovascular: Negative.   Gastrointestinal: Negative.   Endocrine: Negative.   Genitourinary: Negative.   Musculoskeletal: Negative.   Skin: Negative.   Allergic/Immunologic: Negative.   Neurological: Negative.   Hematological: Negative.   Psychiatric/Behavioral: Negative.     Exam:   BP 90/64 (BP Location: Left Arm, Patient  Position: Sitting, Cuff Size: Normal)   Pulse 72   Temp 97.6 F (36.4 C) (Skin)   Ht 5\' 4"  (1.626 m)   Wt 176 lb (79.8 kg)   BMI 30.21 kg/m   Weight change: @WEIGHTCHANGE @ Height:   Height: 5\' 4"  (162.6 cm)  Ht Readings from Last 3 Encounters:  08/23/18 5\' 4"  (1.626 m)  07/06/18 5\' 4"  (1.626 m)  03/16/18 5\' 4"  (1.626 m)    General appearance: alert, cooperative and appears stated age Head: Normocephalic, without obvious abnormality, atraumatic Neck: no adenopathy, supple, symmetrical, trachea midline and thyroid normal to inspection and palpation Lungs: clear to auscultation bilaterally Cardiovascular: regular rate and rhythm Breasts: normal appearance, no masses or tenderness Abdomen: soft, non-tender; non distended,  no masses,  no organomegaly Extremities: extremities normal, atraumatic, no cyanosis or edema Skin: Skin color, texture, turgor normal. No rashes or lesions Lymph nodes: Cervical, supraclavicular, and axillary nodes normal. No abnormal inguinal nodes palpated Neurologic: Grossly normal   Pelvic: External genitalia:  no lesions              Urethra:  normal appearing urethra with no masses, tenderness or lesions              Bartholins and Skenes: normal                 Vagina: normal appearing vagina with normal color and discharge, no lesions              Cervix: absent               Bimanual Exam:  Uterus:  uterus absent              Adnexa: no mass, fullness, tenderness               Rectovaginal: Confirms               Anus:  normal sphincter tone, no lesions  Chaperone was present for exam.  A:  Well Woman with normal exam  Vasomotor symptoms, tolerable  Vaginal dryness, helped with lubricant  H/O VAIN, last abnormal pap in 2018  FH of mother with hip fracture x 2  H/O vit d and vit B12 def   P:   Pap with hpv  Mammogram overdue, she will schedule  Discussed breast self exam  Discussed calcium and vit D intake  Colonoscopy UTD  DEXA  Labs with  primary MD

## 2018-08-24 ENCOUNTER — Other Ambulatory Visit (HOSPITAL_COMMUNITY)
Admission: RE | Admit: 2018-08-24 | Discharge: 2018-08-24 | Disposition: A | Discharge status: Home / Self Care | Payer: BC Managed Care – PPO | Source: Ambulatory Visit | Attending: Obstetrics and Gynecology | Admitting: Obstetrics and Gynecology

## 2018-08-24 DIAGNOSIS — Z1272 Encounter for screening for malignant neoplasm of vagina: Secondary | ICD-10-CM | POA: Insufficient documentation

## 2018-08-24 LAB — COMPREHENSIVE METABOLIC PANEL
ALT: 17 IU/L (ref 0–32)
AST: 18 IU/L (ref 0–40)
Albumin/Globulin Ratio: 1.8 (ref 1.2–2.2)
Albumin: 4.3 g/dL (ref 3.8–4.9)
Alkaline Phosphatase: 71 IU/L (ref 39–117)
BUN/Creatinine Ratio: 19 (ref 9–23)
BUN: 13 mg/dL (ref 6–24)
Bilirubin Total: <0.2 mg/dL (ref 0.0–1.2)
CO2: 27 mmol/L (ref 20–29)
Calcium: 8.8 mg/dL (ref 8.7–10.2)
Chloride: 102 mmol/L (ref 96–106)
Creatinine, Ser: 0.67 mg/dL (ref 0.57–1.00)
GFR calc Af Amer: 116 mL/min/{1.73_m2} (ref >59)
GFR calc non Af Amer: 101 mL/min/{1.73_m2} (ref >59)
Globulin, Total: 2.4 g/dL (ref 1.5–4.5)
Glucose: 82 mg/dL (ref 65–99)
Potassium: 4 mmol/L (ref 3.5–5.2)
Sodium: 140 mmol/L (ref 134–144)
Total Protein: 6.7 g/dL (ref 6.0–8.5)

## 2018-08-24 LAB — CBC
Hematocrit: 36.8 % (ref 34.0–46.6)
Hemoglobin: 12 g/dL (ref 11.1–15.9)
MCH: 28.5 pg (ref 26.6–33.0)
MCHC: 32.6 g/dL (ref 31.5–35.7)
MCV: 87 fL (ref 79–97)
Platelets: 343 10*3/uL (ref 150–450)
RBC: 4.21 x10E6/uL (ref 3.77–5.28)
RDW: 12.8 % (ref 11.7–15.4)
WBC: 5.8 10*3/uL (ref 3.4–10.8)

## 2018-08-24 LAB — HEMOGLOBIN A1C
Est. average glucose Bld gHb Est-mCnc: 108 mg/dL
Hgb A1c MFr Bld: 5.4 % (ref 4.8–5.6)

## 2018-08-24 LAB — TSH: TSH: 1.71 u[IU]/mL (ref 0.450–4.500)

## 2018-08-24 LAB — LIPID PANEL
Chol/HDL Ratio: 3.6 ratio (ref 0.0–4.4)
Cholesterol, Total: 213 mg/dL — ABNORMAL HIGH (ref 100–199)
HDL: 59 mg/dL (ref >39)
LDL Calculated: 130 mg/dL — ABNORMAL HIGH (ref 0–99)
Triglycerides: 118 mg/dL (ref 0–149)
VLDL Cholesterol Cal: 24 mg/dL (ref 5–40)

## 2018-08-24 LAB — VITAMIN D 25 HYDROXY (VIT D DEFICIENCY, FRACTURES): Vit D, 25-Hydroxy: 28.8 ng/mL — ABNORMAL LOW (ref 30.0–100.0)

## 2018-08-24 LAB — VITAMIN B12: Vitamin B-12: 341 pg/mL (ref 232–1245)

## 2018-08-24 LAB — FOLLICLE STIMULATING HORMONE: FSH: 72.8 m[IU]/mL

## 2018-08-24 NOTE — Addendum Note (Signed)
Addended by: Dorothy Spark on: 08/24/2018 09:15 AM   Modules accepted: Orders

## 2018-08-26 LAB — CYTOLOGY - PAP: HPV: NOT DETECTED

## 2018-08-29 ENCOUNTER — Other Ambulatory Visit: Payer: Self-pay | Admitting: Obstetrics and Gynecology

## 2018-08-29 ENCOUNTER — Telehealth: Payer: Self-pay

## 2018-08-29 DIAGNOSIS — Z1231 Encounter for screening mammogram for malignant neoplasm of breast: Secondary | ICD-10-CM

## 2018-08-29 DIAGNOSIS — R87622 Low grade squamous intraepithelial lesion on cytologic smear of vagina (LGSIL): Secondary | ICD-10-CM

## 2018-08-29 NOTE — Telephone Encounter (Signed)
-----   Message from Salvadore Dom, MD sent at 08/29/2018  9:44 AM EDT ----- Patient with a h/o a hysterectomy and vaginal dysplasia. Please set her up for a colposcopy.

## 2018-08-29 NOTE — Telephone Encounter (Signed)
Spoke with patient. Results given. Patient verbalizes understanding. Colposcopy scheduled for 08/31/2018 at 3:30 pm with Dr.Jertson. Patient is agreeable to date and time. Order placed.  Cc: Lerry Liner  Routing to provider and will close encounter.

## 2018-08-30 NOTE — Progress Notes (Signed)
GYNECOLOGY  VISIT   HPI: 53 y.o.   Married White or Caucasian Not Hispanic or Latino  female   [phone] with No LMP recorded. Patient has had a hysterectomy.   here for vaginal colposcopy due to LGSIL on pap, negative hpv. In 2018 she had LSIL pap with HPV, colposcopy with hpv effect, no dysplasia. Pap last year was negative with negative hpv. The patient has concerns about hpv and any possible systemic effects.   GYNECOLOGIC HISTORY: No LMP recorded. Patient has had a hysterectomy. Contraception: Hysterectomy Menopausal hormone therapy: None        OB History    Gravida  3   Para  2   Term  2   Preterm  0   AB  1   Living  2     SAB  1   TAB  0   Ectopic  0   Multiple      Live Births  2              Patient Active Problem List   Diagnosis Date Noted  . Brachial neuritis of right upper extremity 07/06/2018  . Well adult exam 03/16/2018  . Obesity 03/16/2018  . Conjunctivitis 07/14/2017  . Sweats, menopausal 10/20/2016  . Panic attacks 10/20/2016  . Loss of transverse plantar arch of right foot 09/07/2016  . Other bursal cyst of wrist 08/11/2016  . Posterior right knee pain 07/16/2016  . Knee pain, right 05/21/2016  . Hamstring strain, sequela 05/21/2016  . Wart viral 05/12/2016  . Contusion of knee 05/05/2016  . Fall involving sidewalk curb 05/05/2016  . Aphasia 03/13/2016  . Shoulder pain, bilateral 12/11/2015  . Fibromyalgia 12/11/2015  . Post concussion syndrome 11/01/2015  . Clavicle fracture 10/30/2015  . Hair loss 10/11/2015  . Pain in joint, shoulder region 09/11/2015  . Right wrist pain 08/07/2015  . Splinter in skin 07/30/2015  . Fall from ladder 07/30/2015  . Skull fracture with concussion ([place]) 07/30/2015  . Fracture of cervical vertebra, C6 ([place]) 07/30/2015  . Thoracic vertebral fracture ([place]) 07/30/2015  . Abdominal pain, epigastric 05/07/2015  . Scaly patch rash 02/26/2015  . Mitral valve regurgitation 09/27/2014  . PAT  (paroxysmal atrial tachycardia) ([place]) 09/06/2014  . Tachycardia 07/31/2014  . Sprain of ankle 06/29/2014  . Earache, left 05/15/2013  . Dizziness 05/15/2013  . Asthmatic bronchitis 05/08/2013  . Benign paroxysmal positional vertigo 05/08/2013  . Chest wall pain 01/26/2013  . LUQ abdominal pain 01/20/2013  . Insomnia 01/10/2013  . Depression (emotion) 01/10/2013  . URI, acute 08/25/2012  . Snoring 08/25/2012  . IBS (irritable bowel syndrome) 02/08/2012  . Multiple pulmonary nodules 02/08/2012  . Chest heaviness 11/16/2010  . GERD 05/24/2010  . SINUSITIS, MAXILLARY, CHRONIC 05/20/2010  . Myalgia and myositis 11/20/2009  . Fatigue 11/20/2009  . ARTHRALGIA 06/26/2009  . Cervicogenic headache 06/26/2009  . TOBACCO USE, QUIT 06/26/2009  . LOW BACK PAIN 02/20/2009  . Rosacea 12/27/2008  . PRURITUS 12/27/2008  . Allergic urticaria 12/27/2008  . B12 deficiency 01/02/2008    Past Medical History:  Diagnosis Date  . Allergic urticaria   . ARTHRALGIA   . Arthritis   . Atrial fibrillation ([place])   . B12 DEFICIENCY   . CFS (chronic fatigue syndrome)   . Endometriosis   . Epidural hemorrhage with loss of consciousness ([place]) 06/29/15  . Fibroid   . Fibromyalgia   . Fracture of cervical vertebra, C6 ([place]) 06/29/15  . Gastritis   . GERD   .  Heart murmur   . History of endometriosis   . IBS (irritable bowel syndrome)   . Internal hemorrhoids   . MVP (mitral valve prolapse)   . Osteoarthritis   . Rosacea   . Skin cancer of arm, right   . Thoracic compression fracture ([place]) 06/29/15   T2-T6    Past Surgical History:  Procedure Laterality Date  . ABDOMINAL HYSTERECTOMY     partial  . ABDOMINAL HYSTERECTOMY    . ANAL FISSURE REPAIR     Dr Rise [name] 2009  . APPENDECTOMY    . COLPOSCOPY    . HEMORRHOID SURGERY     Dr. Rise [name] 2009  . OVARIAN CYST REMOVAL    . RT BUNIONECTOMT    . TONSILLECTOMY AND ADENOIDECTOMY    . TUBAL LIGATION    . UTERINE FIBROID SURGERY      Current  Outpatient Medications  Medication Sig Dispense Refill  . albuterol (PROAIR HFA) 108 (90 Base) MCG/ACT inhaler Inhale 2 puffs into the lungs every 6 (six) hours as needed for wheezing or shortness of breath (before exercising). 1 Inhaler 6  . CASCARA SAGRADA PO Take by mouth.    . Cholecalciferol (GNP VITAMIN D) 1000 units tablet Take 2 tablets (2,000 Units total) by mouth daily. 100 tablet 3  . cyanocobalamin (,VITAMIN B-12,) 1000 MCG/ML injection Inject 1 mL (1,000 mcg total) into the skin every 14 (fourteen) days. 10 mL 11  . gabapentin (NEURONTIN) 300 MG capsule nightly 30 capsule 3  . Turmeric 500 MG TABS Take by mouth 2 (two) times daily.     No current facility-administered medications for this visit.      ALLERGIES: Indocin [indomethacin]; Morphine sulfate; Sulfacetamide sodium; Caffeine; Morphine and related; Sulfa antibiotics; Azithromycin; and Erythromycin stearate  Family History  Problem Relation Age of Onset  . Lung cancer Mother   . Thyroid cancer Mother   . Cancer Mother        INTESTINAL  . Healthy Father   . Diabetes Other        uncles/aunts  . Von Willebrand disease Daughter   . Asthma Neg Hx     Social History   Socioeconomic History  . Marital status: Married    Spouse name: Not on file  . Number of children: Not on file  . Years of education: Not on file  . Highest education level: Not on file  Occupational History  . Not on file  Social Needs  . Financial resource strain: Not on file  . Food insecurity:    Worry: Not on file    Inability: Not on file  . Transportation needs:    Medical: Not on file    Non-medical: Not on file  Tobacco Use  . Smoking status: Former Smoker    Last attempt to quit: 10/31/1988    Years since quitting: 29.8  . Smokeless tobacco: Never Used  Substance and Sexual Activity  . Alcohol use: Yes    Alcohol/week: 1.0 - 2.0 standard drinks    Types: 1 - 2 Standard drinks or equivalent per week    Comment: occasional   .  Drug use: No  . Sexual activity: Yes    Partners: Male    Birth control/protection: Surgical  Lifestyle  . Physical activity:    Days per week: Not on file    Minutes per session: Not on file  . Stress: Not on file  Relationships  . Social connections:    Talks on phone: Not on  file    Gets together: Not on file    Attends religious service: Not on file    Active member of club or organization: Not on file    Attends meetings of clubs or organizations: Not on file    Relationship status: Not on file  . Intimate partner violence:    Fear of current or ex partner: Not on file    Emotionally abused: Not on file    Physically abused: Not on file    Forced sexual activity: Not on file  Other Topics Concern  . Not on file  Social History Narrative   ** Merged History Encounter **       Working 2 jobs, 6d/wk      Regular exercise-no      Divorced    Review of Systems  Constitutional: Negative.   HENT: Negative.   Eyes: Negative.   Respiratory: Negative.   Cardiovascular: Negative.   Gastrointestinal: Negative.   Genitourinary: Negative.   Musculoskeletal: Negative.   Skin: Negative.   Neurological: Negative.   Endo/Heme/Allergies: Negative.   Psychiatric/Behavioral: Negative.     PHYSICAL EXAMINATION:    BP 92/62 (BP Location: Right Arm, Patient Position: Sitting, Cuff Size: Normal)   Pulse 76   Temp 98 F (36.7 C) (Skin)   Wt 175 lb 6.4 oz (79.6 kg)   BMI 30.11 kg/m     General appearance: alert, cooperative and appears stated age  Pelvic: External genitalia:  no lesions              Urethra:  normal appearing urethra with no masses, tenderness or lesions              Bartholins and Skenes: normal                 Vagina: normal appearing vagina with normal color and discharge, no lesions              Cervix:absent  Colposcopy of entire vagina, no aceto-white changes. Minimal decrease in lugols uptake of the vaginal apex, biopsy done. Hemostasis obtained  with silver nitrate. The rest of the vagina with normal uptake of lugols.   Chaperone was present for exam.  ASSESSMENT LSIL pap of vagina, minimal changes on colposcopy Discussed vaginal dysplasia and hpv. Reviewed that her testing was negative for the more aggressive forms of HPV    PLAN Biopsy of vaginal apex done Further plan based on results.  Questions answered   An After Visit Summary was printed and given to the patient.

## 2018-08-31 ENCOUNTER — Ambulatory Visit: Payer: BC Managed Care – PPO | Admitting: Obstetrics and Gynecology

## 2018-08-31 ENCOUNTER — Encounter: Payer: Self-pay | Admitting: Obstetrics and Gynecology

## 2018-08-31 ENCOUNTER — Other Ambulatory Visit: Payer: Self-pay

## 2018-08-31 DIAGNOSIS — R87622 Low grade squamous intraepithelial lesion on cytologic smear of vagina (LGSIL): Secondary | ICD-10-CM

## 2018-08-31 NOTE — Patient Instructions (Signed)

## 2018-09-01 ENCOUNTER — Telehealth: Payer: Self-pay | Admitting: Neurology

## 2018-09-01 NOTE — Telephone Encounter (Signed)
Pt returned call, pls call her again. 

## 2018-09-02 ENCOUNTER — Telehealth (INDEPENDENT_AMBULATORY_CARE_PROVIDER_SITE_OTHER): Payer: BC Managed Care – PPO | Admitting: Neurology

## 2018-09-02 ENCOUNTER — Other Ambulatory Visit: Payer: Self-pay

## 2018-09-02 ENCOUNTER — Encounter: Payer: Self-pay | Admitting: Neurology

## 2018-09-02 VITALS — Ht 64.0 in | Wt 175.0 lb

## 2018-09-02 DIAGNOSIS — F0781 Postconcussional syndrome: Secondary | ICD-10-CM | POA: Diagnosis not present

## 2018-09-02 DIAGNOSIS — G44329 Chronic post-traumatic headache, not intractable: Secondary | ICD-10-CM

## 2018-09-02 NOTE — Progress Notes (Signed)
   Virtual Visit via Video Note The purpose of this virtual visit is to provide medical care while limiting exposure to the novel coronavirus.    Consent was obtained for video visit:  Yes.   Answered questions that patient had about telehealth interaction:  Yes.   I discussed the limitations, risks, security and privacy concerns of performing an evaluation and management service by telemedicine. I also discussed with the patient that there may be a patient responsible charge related to this service. The patient expressed understanding and agreed to proceed.  Pt location: Home Physician Location: office Name of referring provider:  Plotnikov, [name], MD I connected with Viviano Simas at patients initiation/request on 09/02/2018 at  2:30 PM EDT by video enabled telemedicine application and verified that I am speaking with the correct person using two identifiers. Pt MRN:  [id number] Pt DOB:  1965-09-19 Video Participants:  Viviano Simas   History of Present Illness: This is a 53 y.o. female returning for follow-up of worsening headaches.  She was diagnosed with postconcussive syndrome in 2018 by formal neuropsychological testing.  Over the past 3 months, she has been having increased frequency of right frontal headaches.  Pain is described as sharp and with crawling sensation.  It occurs about 3 times per week and lasts anywhere from 1 to 3 hours.  She does not have any associated nausea, vomiting, photophobia, or phonophobia.  No other associated limb weakness or numbness or tingling.    Observations/Objective:   Vitals:   09/02/18 0912  Weight: 175 lb (79.4 kg)  Height: 5\' 4"  (1.626 m)   Patient is awake, alert, and appears comfortable.  Oriented x 4.   Extraocular muscles are intact. No ptosis.  Face is symmetric.  Speech is not dysarthric. Tongue is midline. Antigravity in all extremities.  No pronator drift. Gait appears normal.  Stress gait is intact.   Assessment and Plan:   Postconcussive headaches.    -Frequency of headaches do not warrant daily preventative medication  -Recommend neck physical therapy.  She prefers nonpharmacological therapies  -For severe headache, she was instructed to take Tylenol or ibuprofen, limit to twice per week   Follow Up Instructions:   I discussed the assessment and treatment plan with the patient. The patient was provided an opportunity to ask questions and all were answered. The patient agreed with the plan and demonstrated an understanding of the instructions.   The patient was advised to call back or seek an in-person evaluation if the symptoms worsen or if the condition fails to improve as anticipated.  Total time spent:  25 minutes     Alda Berthold, DO

## 2018-09-02 NOTE — Telephone Encounter (Signed)
Spoke with patient.

## 2018-09-02 NOTE — Progress Notes (Signed)
Referral for physical therapy at [facility] placed and contacted.

## 2018-09-23 ENCOUNTER — Ambulatory Visit: Payer: BC Managed Care – PPO | Admitting: Neurology

## 2018-11-10 ENCOUNTER — Other Ambulatory Visit: Payer: Self-pay

## 2018-11-10 ENCOUNTER — Ambulatory Visit
Admission: RE | Admit: 2018-11-10 | Discharge: 2018-11-10 | Disposition: A | Discharge status: Home / Self Care | Payer: BC Managed Care – PPO | Source: Ambulatory Visit | Attending: Obstetrics and Gynecology | Admitting: Obstetrics and Gynecology

## 2018-11-10 DIAGNOSIS — E2839 Other primary ovarian failure: Secondary | ICD-10-CM

## 2018-11-10 DIAGNOSIS — Z1231 Encounter for screening mammogram for malignant neoplasm of breast: Secondary | ICD-10-CM

## 2018-11-10 DIAGNOSIS — Z8489 Family history of other specified conditions: Secondary | ICD-10-CM

## 2018-12-26 ENCOUNTER — Ambulatory Visit: Payer: Self-pay

## 2018-12-26 ENCOUNTER — Ambulatory Visit (INDEPENDENT_AMBULATORY_CARE_PROVIDER_SITE_OTHER): Payer: BC Managed Care – PPO | Admitting: Internal Medicine

## 2018-12-26 ENCOUNTER — Encounter: Payer: Self-pay | Admitting: Internal Medicine

## 2018-12-26 DIAGNOSIS — K5900 Constipation, unspecified: Secondary | ICD-10-CM | POA: Diagnosis not present

## 2018-12-26 DIAGNOSIS — R109 Unspecified abdominal pain: Secondary | ICD-10-CM | POA: Insufficient documentation

## 2018-12-26 DIAGNOSIS — K219 Gastro-esophageal reflux disease without esophagitis: Secondary | ICD-10-CM

## 2018-12-26 MED ORDER — OMEPRAZOLE 20 MG PO CPDR: 20.0000 mg | DELAYED_RELEASE_CAPSULE | Freq: Every day | ORAL | 3 refills | Status: DC

## 2018-12-26 NOTE — Progress Notes (Signed)
Subjective:    Patient ID: Regina [name], female    DOB: 1965/07/26, 53 y.o.   MRN: [id number]  HPI The patient is here for an acute visit.  We did do a virtual visit yesterday and she is here to follow-up on those issues as well as discuss more chronic issues.   GERD:  She has daily GERD no matter what she eats or drinks.  She has had GERD for several months.  She has not taken any prescription medications for this.  Yesterday I did prescribe omeprazole 20 mg and advised her to take it 30 minutes before a meal.  She ate this morning and then realize she was supposed to take it prior so she did not take it today.  She is experiencing some reflux now.  Constipation: About 1 week ago she started experiencing constipation for no specific reason.  Typically she was very regular prior to that with the exception of missing a day on occasion.  She went 4 days without a bowel movement and yesterday morning she did have a small 1, but still felt bloated and constipated.  Yesterday afternoon she had a good BM and this morning she a good BM.  She is not sure if she has emptied out completely.  She denies blood in the stool or black stool.  She is not needed anything for constipation for a while, but previously she was taking a natural concoction that helped.  She will start back on her natural remedies to prevent constipation.  Good tenderness in her left lower quadrant did get a little better after her bowel movements yesterday.  Abdominal pain: She has been experiencing abdominal pain for at least a couple of months.  She has pain in 3 different areas.  She has pain in the left lower quadrant.  This area is tender to palpation.  She feels it is worse with rolling over in bed or bending over in a certain way.  She denies any bulge or swelling there.  She does not feel that this is a hernia.  She does not think there is a change with bowel movements, but it was worse with her recent constipation.  She also  has pain in her left mid quadrant and left upper quadrant.  There is no pattern to the pain.  It is not worse with eating.  She feels the pain every day.  She is also been experiencing pain on the left anterior part of her neck and she is unsure if it is related to the neck pain or not.     Medications and allergies reviewed with patient and updated if appropriate.  Patient Active Problem List   Diagnosis Date Noted   Constipation 12/26/2018   Abdominal pain 12/26/2018   Brachial neuritis of right upper extremity 07/06/2018   Well adult exam 03/16/2018   Obesity 03/16/2018   Conjunctivitis 07/14/2017   Sweats, menopausal 10/20/2016   Panic attacks 10/20/2016   Loss of transverse plantar arch of right foot 09/07/2016   Other bursal cyst of wrist 08/11/2016   Posterior right knee pain 07/16/2016   Knee pain, right 05/21/2016   Hamstring strain, sequela 05/21/2016   Wart viral 05/12/2016   Contusion of knee 05/05/2016   Fall involving sidewalk curb 05/05/2016   Aphasia 03/13/2016   Shoulder pain, bilateral 12/11/2015   Fibromyalgia 12/11/2015   Post concussion syndrome 11/01/2015   Clavicle fracture 10/30/2015   Hair loss 10/11/2015   Pain in joint,  shoulder region 09/11/2015   Right wrist pain 08/07/2015   Splinter in skin 07/30/2015   Fall from ladder 07/30/2015   Skull fracture with concussion ([place]) 07/30/2015   Fracture of cervical vertebra, C6 (HCC) 07/30/2015   Thoracic vertebral fracture ([place]) 07/30/2015   Abdominal pain, epigastric 05/07/2015   Scaly patch rash 02/26/2015   Mitral valve regurgitation 09/27/2014   PAT (paroxysmal atrial tachycardia) ([place]) 09/06/2014   Tachycardia 07/31/2014   Sprain of ankle 06/29/2014   Earache, left 05/15/2013   Dizziness 05/15/2013   Asthmatic bronchitis 05/08/2013   Benign paroxysmal positional vertigo 05/08/2013   Chest wall pain 01/26/2013   LUQ abdominal pain 01/20/2013    Insomnia 01/10/2013   Depression (emotion) 01/10/2013   URI, acute 08/25/2012   Snoring 08/25/2012   IBS (irritable bowel syndrome) 02/08/2012   Multiple pulmonary nodules 02/08/2012   Chest heaviness 11/16/2010   GERD 05/24/2010   SINUSITIS, MAXILLARY, CHRONIC 05/20/2010   Myalgia and myositis 11/20/2009   Fatigue 11/20/2009   ARTHRALGIA 06/26/2009   Cervicogenic headache 06/26/2009   TOBACCO USE, QUIT 06/26/2009   LOW BACK PAIN 02/20/2009   Rosacea 12/27/2008   PRURITUS 12/27/2008   Allergic urticaria 12/27/2008   B12 deficiency 01/02/2008    Current Outpatient Medications on File Prior to Visit  Medication Sig Dispense Refill   albuterol (PROAIR HFA) 108 (90 Base) MCG/ACT inhaler Inhale 2 puffs into the lungs every 6 (six) hours as needed for wheezing or shortness of breath (before exercising). 1 Inhaler 6   CASCARA SAGRADA PO Take by mouth.     Cholecalciferol (GNP VITAMIN D) 1000 units tablet Take 2 tablets (2,000 Units total) by mouth daily. 100 tablet 3   cyanocobalamin (,VITAMIN B-12,) 1000 MCG/ML injection Inject 1 mL (1,000 mcg total) into the skin every 14 (fourteen) days. 10 mL 11   gabapentin (NEURONTIN) 300 MG capsule nightly 30 capsule 3   omeprazole (PRILOSEC) 20 MG capsule Take 1 capsule (20 mg total) by mouth daily. Take 30 minutes prior to breakfast 30 capsule 3   Turmeric 500 MG TABS Take by mouth 2 (two) times daily.     No current facility-administered medications on file prior to visit.     Past Medical History:  Diagnosis Date   Allergic urticaria    ARTHRALGIA    Arthritis    Atrial fibrillation (HCC)    B12 DEFICIENCY    CFS (chronic fatigue syndrome)    Endometriosis    Epidural hemorrhage with loss of consciousness ([place]) 06/29/15   Fibroid    Fibromyalgia    Fracture of cervical vertebra, C6 (HCC) 06/29/15   Gastritis    GERD    Heart murmur    History of endometriosis    IBS (irritable bowel syndrome)     Internal hemorrhoids    MVP (mitral valve prolapse)    Osteoarthritis    Rosacea    Skin cancer of arm, right    Thoracic compression fracture ([place]) 06/29/15   T2-T6    Past Surgical History:  Procedure Laterality Date   ABDOMINAL HYSTERECTOMY     partial   ABDOMINAL HYSTERECTOMY     ANAL FISSURE REPAIR     Dr Weatherly 2009   APPENDECTOMY     COLPOSCOPY     HEMORRHOID SURGERY     Dr. Rise [name] 2009   OVARIAN CYST REMOVAL     RT BUNIONECTOMT     TONSILLECTOMY AND ADENOIDECTOMY     TUBAL LIGATION     UTERINE  FIBROID SURGERY      Social History   Socioeconomic History   Marital status: Married    Spouse name: Not on file   Number of children: Not on file   Years of education: Not on file   Highest education level: Not on file  Occupational History   Not on file  Social Needs   Financial resource strain: Not on file   Food insecurity    Worry: Not on file    Inability: Not on file   Transportation needs    Medical: Not on file    Non-medical: Not on file  Tobacco Use   Smoking status: Former Smoker    Quit date: 10/31/1988    Years since quitting: 30.1   Smokeless tobacco: Never Used  Substance and Sexual Activity   Alcohol use: Yes    Alcohol/week: 1.0 - 2.0 standard drinks    Types: 1 - 2 Standard drinks or equivalent per week    Comment: occasional    Drug use: No   Sexual activity: Yes    Partners: Male    Birth control/protection: Surgical  Lifestyle   Physical activity    Days per week: Not on file    Minutes per session: Not on file   Stress: Not on file  Relationships   Social connections    Talks on phone: Not on file    Gets together: Not on file    Attends religious service: Not on file    Active member of club or organization: Not on file    Attends meetings of clubs or organizations: Not on file    Relationship status: Not on file  Other Topics Concern   Not on file  Social History Narrative   **  Merged History Encounter **       Working 2 jobs, 6d/wk      Regular exercise-no      Divorced    Family History  Problem Relation Age of Onset   Lung cancer Mother    Thyroid cancer Mother    Cancer Mother        INTESTINAL   Healthy Father    Diabetes Other        uncles/aunts   Von Willebrand disease Daughter    Asthma Neg Hx     Review of Systems  Constitutional: Negative for chills and fever.  Cardiovascular: Negative for chest pain.  Gastrointestinal: Positive for abdominal distention, abdominal pain and constipation. Negative for blood in stool (no black stool), diarrhea, nausea and vomiting.       Daily GERD  Endocrine: Positive for cold intolerance.  Genitourinary: Negative for dysuria and hematuria.       Objective:   Vitals:   12/27/18 1002  BP: 106/70  Pulse: 70  Resp: 16  Temp: 98.4 F (36.9 C)  SpO2: 98%   BP Readings from Last 3 Encounters:  12/27/18 106/70  08/31/18 92/62  08/23/18 90/64   Wt Readings from Last 3 Encounters:  12/27/18 177 lb (80.3 kg)  09/02/18 175 lb (79.4 kg)  08/31/18 175 lb 6.4 oz (79.6 kg)   Body mass index is 30.38 kg/m.   Physical Exam Constitutional:      Appearance: Normal appearance.  HENT:     Head: Normocephalic and atraumatic.  Abdominal:     General: There is no distension.     Palpations: Abdomen is soft.     Tenderness: There is abdominal tenderness (mild-moderate tenderness in LLQ, LMQ and LUQ).  There is no guarding or rebound.  Musculoskeletal:     Right lower leg: No edema.     Left lower leg: No edema.  Skin:    General: Skin is warm and dry.  Neurological:     Mental Status: She is alert.            Assessment & Plan:    See Problem List for Assessment and Plan of chronic medical problems.

## 2018-12-26 NOTE — Assessment & Plan Note (Signed)
She has been experiencing GERD on a daily basis with anything she eats or drinks Denies dysphagia Advised her to take omeprazole 20 mg daily-at least until this is controlled and then she can try a more natural remedies Prescription sent to pharmacy

## 2018-12-26 NOTE — Assessment & Plan Note (Signed)
She has been experiencing some abdominal pain in 3 different areas on the left side of her abdomen for the past couple of months and more severe pain in the left lower quadrant for the past couple of days that is intermittent and possibly related to her constipation We will treat constipation with MiraLAX She has an appointment tomorrow with her PCP for further evaluation of all of her symptoms

## 2018-12-26 NOTE — Telephone Encounter (Addendum)
Reports last BM was Wednesday. No urge to have a BM. Feels full and Bloated. Has had lower abdominal discomfort x 2 months.Pt. afraid to take a laxative because of her GI history and she feels like "something else is going." States she has IBS, "a form of Crohn's, Polyps." Requesting an appointment. Office currently not open. Please advise pt. Contact number [phone].  Answer Assessment - Initial Assessment Questions 1. STOOL PATTERN OR FREQUENCY: "How often do you pass bowel movements (BMs)?"  (Normal range: tid to q 3 days)  "When was the last BM passed?"       Every day 2. STRAINING: "Do you have to strain to have a BM?"      No 3. RECTAL PAIN: "Does your rectum hurt when the stool comes out?" If so, ask: "Do you have hemorrhoids? How bad is the pain?"  (Scale 1-10; or mild, moderate, severe)     No 4. STOOL COMPOSITION: "Are the stools hard?"      No 5. BLOOD ON STOOLS: "Has there been any blood on the toilet tissue or on the surface of the BM?" If so, ask: "When was the last time?"      No 6. CHRONIC CONSTIPATION: "Is this a new problem for you?"  If no, ask: "How long have you had this problem?" (days, weeks, months)      No 7. CHANGES IN DIET: "Have there been any recent changes in your diet?"      Higher protein 8. MEDICATIONS: "Have you been taking any new medications?"     No 9. LAXATIVES: "Have you been using any laxatives or enemas?"  If yes, ask "What, how often, and when was the last time?"     Used to take natural fiber 10. CAUSE: "What do you think is causing the constipation?"        Unsure 11. OTHER SYMPTOMS: "Do you have any other symptoms?" (e.g., abdominal pain, fever, vomiting)       Left lower abd. Pain x 2 months 12. PREGNANCY: "Is there any chance you are pregnant?" "When was your last menstrual period?"       No  Protocols used: CONSTIPATION-A-AH

## 2018-12-26 NOTE — Assessment & Plan Note (Signed)
Change in stools over the last month or so, significant constipation for the past 5 days She is also had some abdominal pain, which requires further evaluation and she does have an appointment for here tomorrow Advised her to take MiraLAX twice daily until she has good bowel movement and then she can revert to her more natural remedies

## 2018-12-26 NOTE — Progress Notes (Signed)
Virtual Visit via Video Note  I connected with Regina [name] on 12/26/18 at 10:30 AM EDT by a video enabled telemedicine application and verified that I am speaking with the correct person using two identifiers.   I discussed the limitations of evaluation and management by telemedicine and the availability of in person appointments. The patient expressed understanding and agreed to proceed.  The patient is currently at work and I am in the office.    No referring provider.    History of Present Illness: This is an acute visit for change in bowels, constipation.  Her last good bowel movement was 5 days ago.  She did have a small bowel movement this morning, but it was not a complete bowel movement and not her normal bowel movement.  The stool is very thin, but normal looking.  She typically is very regular.  For the past month her stools have been slightly different.  She may miss a day on occasion, but typically goes every day.  She has been passing gas normally.  She denies any changes in medications, supplements or diet that could have caused the constipation.  She is unsure why she is constipated.  She has not taken anything for the constipation, but was wondering if she should.  For the past couple of months she has been experiencing left lower quadrant pain and some discomfort just left of her sternum and in her left mid abdomen.  Over the past 2-3 days she has had more severe pain in her left lower quadrant.  She was concerned she could have an obstruction.  She has also been experiencing very frequent heartburn for several months.  She has not taken anything for it.  She experiences it when she eats or drinks anything.  She also states the pain in the left side of her neck that usually comes with the pains in her abdomen.  She wonders about her thyroid function.   She does have an appointment tomorrow with her PCP.  She really wanted to know if she should take something for the  constipation or not.  Review of Systems  Constitutional: Negative for chills and fever.  Respiratory: Negative for shortness of breath.   Cardiovascular: Negative for chest pain and palpitations.  Gastrointestinal: Positive for abdominal pain, constipation and heartburn. Negative for blood in stool and melena.  Musculoskeletal: Positive for neck pain.  Neurological: Negative for headaches.      Social History   Socioeconomic History  . Marital status: Married    Spouse name: Not on file  . Number of children: Not on file  . Years of education: Not on file  . Highest education level: Not on file  Occupational History  . Not on file  Social Needs  . Financial resource strain: Not on file  . Food insecurity    Worry: Not on file    Inability: Not on file  . Transportation needs    Medical: Not on file    Non-medical: Not on file  Tobacco Use  . Smoking status: Former Smoker    Quit date: 10/31/1988    Years since quitting: 30.1  . Smokeless tobacco: Never Used  Substance and Sexual Activity  . Alcohol use: Yes    Alcohol/week: 1.0 - 2.0 standard drinks    Types: 1 - 2 Standard drinks or equivalent per week    Comment: occasional   . Drug use: No  . Sexual activity: Yes    Partners: Male  Birth control/protection: Surgical  Lifestyle  . Physical activity    Days per week: Not on file    Minutes per session: Not on file  . Stress: Not on file  Relationships  . Social Herbalist on phone: Not on file    Gets together: Not on file    Attends religious service: Not on file    Active member of club or organization: Not on file    Attends meetings of clubs or organizations: Not on file    Relationship status: Not on file  Other Topics Concern  . Not on file  Social History Narrative   ** Merged History Encounter **       Working 2 jobs, 6d/wk      Regular exercise-no      Divorced     Observations/Objective: Appears well in NAD Breathing  normally.  Assessment and Plan:  See Problem List for Assessment and Plan of chronic medical problems.   Follow Up Instructions:    I discussed the assessment and treatment plan with the patient. The patient was provided an opportunity to ask questions and all were answered. The patient agreed with the plan and demonstrated an understanding of the instructions.   The patient was advised to call back or seek an in-person evaluation if the symptoms worsen or if the condition fails to improve as anticipated.    Binnie Rail, MD

## 2018-12-27 ENCOUNTER — Other Ambulatory Visit (INDEPENDENT_AMBULATORY_CARE_PROVIDER_SITE_OTHER): Payer: BC Managed Care – PPO

## 2018-12-27 ENCOUNTER — Encounter: Payer: Self-pay | Admitting: Internal Medicine

## 2018-12-27 ENCOUNTER — Ambulatory Visit (INDEPENDENT_AMBULATORY_CARE_PROVIDER_SITE_OTHER): Payer: BC Managed Care – PPO | Admitting: Internal Medicine

## 2018-12-27 ENCOUNTER — Ambulatory Visit: Payer: BC Managed Care – PPO | Admitting: Internal Medicine

## 2018-12-27 ENCOUNTER — Other Ambulatory Visit: Payer: Self-pay

## 2018-12-27 VITALS — BP 106/70 | HR 70 | Temp 98.4°F | Resp 16 | Ht 64.0 in | Wt 177.0 lb

## 2018-12-27 DIAGNOSIS — R140 Abdominal distension (gaseous): Secondary | ICD-10-CM | POA: Diagnosis not present

## 2018-12-27 DIAGNOSIS — R109 Unspecified abdominal pain: Secondary | ICD-10-CM

## 2018-12-27 DIAGNOSIS — K219 Gastro-esophageal reflux disease without esophagitis: Secondary | ICD-10-CM

## 2018-12-27 DIAGNOSIS — K5900 Constipation, unspecified: Secondary | ICD-10-CM

## 2018-12-27 LAB — COMPREHENSIVE METABOLIC PANEL
ALT: 17 U/L (ref 0–35)
AST: 15 U/L (ref 0–37)
Albumin: 4.4 g/dL (ref 3.5–5.2)
Alkaline Phosphatase: 66 U/L (ref 39–117)
BUN: 12 mg/dL (ref 6–23)
CO2: 28 mEq/L (ref 19–32)
Calcium: 9.5 mg/dL (ref 8.4–10.5)
Chloride: 102 mEq/L (ref 96–112)
Creatinine, Ser: 0.71 mg/dL (ref 0.40–1.20)
GFR: 85.89 mL/min (ref >60.00)
Glucose, Bld: 90 mg/dL (ref 70–99)
Potassium: 4.2 mEq/L (ref 3.5–5.1)
Sodium: 138 mEq/L (ref 135–145)
Total Bilirubin: 0.4 mg/dL (ref 0.2–1.2)
Total Protein: 7.4 g/dL (ref 6.0–8.3)

## 2018-12-27 LAB — CBC WITH DIFFERENTIAL/PLATELET
Basophils Absolute: 0 10*3/uL (ref 0.0–0.1)
Basophils Relative: 0.2 % (ref 0.0–3.0)
Eosinophils Absolute: 0 10*3/uL (ref 0.0–0.7)
Eosinophils Relative: 0 % (ref 0.0–5.0)
HCT: 38.7 % (ref 36.0–46.0)
Hemoglobin: 13 g/dL (ref 12.0–15.0)
Lymphocytes Relative: 35.7 % (ref 12.0–46.0)
Lymphs Abs: 2.1 10*3/uL (ref 0.7–4.0)
MCHC: 33.5 g/dL (ref 30.0–36.0)
MCV: 87.5 fl (ref 78.0–100.0)
Monocytes Absolute: 0.4 10*3/uL (ref 0.1–1.0)
Monocytes Relative: 7.6 % (ref 3.0–12.0)
Neutro Abs: 3.3 10*3/uL (ref 1.4–7.7)
Neutrophils Relative %: 56.5 % (ref 43.0–77.0)
Platelets: 330 10*3/uL (ref 150.0–400.0)
RBC: 4.43 Mil/uL (ref 3.87–5.11)
RDW: 13 % (ref 11.5–15.5)
WBC: 5.8 10*3/uL (ref 4.0–10.5)

## 2018-12-27 LAB — TSH: TSH: 1.2 u[IU]/mL (ref 0.35–4.50)

## 2018-12-27 NOTE — Patient Instructions (Signed)
  Tests ordered today. Your results will be released to [facility] (or called to you) after review.  If any changes need to be made, you will be notified at that same time.    Medications reviewed and updated.  Changes include :   none    A Ct scan was ordered of your abdomen.  They will call you to schedule that.

## 2018-12-27 NOTE — Assessment & Plan Note (Signed)
For at least a couple of months she has had pain in her left lower quadrant, left mid quadrant and left upper quadrant Her pain was worse in the left lower quadrant with the severe constipation, but that has improved but still has residual pain She is tender on exam in those areas Given the chronicity of the pain need to evaluate further CBC, CMP Will obtain a CT of the abdomen and pelvis

## 2018-12-27 NOTE — Assessment & Plan Note (Signed)
Had experience constipation for 6 days, but this has self corrected itself without treatment She was experiencing increased left lower quadrant pain, which has improved, but still has some residual pain there In the past she has taken a natural concoction to help prevent constipation and she will restart that Check TSH, CBC, CMP

## 2018-12-27 NOTE — Assessment & Plan Note (Signed)
Having daily GERD for a few months at least Discussed diet changes she could make She does need to be on medication-I did prescribe omeprazole 20 mg daily yesterday-she will start taking that today Discussed that she can try natural remedies, but really needs a prescription medication for now If symptoms do not improve she needs to contact us

## 2018-12-28 ENCOUNTER — Ambulatory Visit (INDEPENDENT_AMBULATORY_CARE_PROVIDER_SITE_OTHER): Payer: BC Managed Care – PPO

## 2018-12-28 ENCOUNTER — Ambulatory Visit: Payer: BC Managed Care – PPO | Admitting: Podiatry

## 2018-12-28 ENCOUNTER — Other Ambulatory Visit: Payer: Self-pay

## 2018-12-28 DIAGNOSIS — M898X7 Other specified disorders of bone, ankle and foot: Secondary | ICD-10-CM

## 2018-12-28 DIAGNOSIS — L989 Disorder of the skin and subcutaneous tissue, unspecified: Secondary | ICD-10-CM | POA: Diagnosis not present

## 2018-12-28 DIAGNOSIS — M2041 Other hammer toe(s) (acquired), right foot: Secondary | ICD-10-CM

## 2018-12-29 NOTE — Progress Notes (Signed)
   Subjective: 53 y.o. female presenting to the office today with a chief complaint of right toe pain that has been ongoing for the past few years. She reports a "knot" on the right third toe. Wearing shoes and walking for long periods of time increases the pain. She has been wearing supportive shoes and taking OTC pain medication for treatment. Patient is here for further evaluation and treatment.   Past Medical History:  Diagnosis Date  . Allergic urticaria   . ARTHRALGIA   . Arthritis   . Atrial fibrillation ([place])   . B12 DEFICIENCY   . CFS (chronic fatigue syndrome)   . Endometriosis   . Epidural hemorrhage with loss of consciousness ([place]) 06/29/15  . Fibroid   . Fibromyalgia   . Fracture of cervical vertebra, C6 ([place]) 06/29/15  . Gastritis   . GERD   . Heart murmur   . History of endometriosis   . IBS (irritable bowel syndrome)   . Internal hemorrhoids   . MVP (mitral valve prolapse)   . Osteoarthritis   . Rosacea   . Skin cancer of arm, right   . Thoracic compression fracture ([place]) 06/29/15   T2-T6     Objective:  Physical Exam General: Alert and oriented x3 in no acute distress  Dermatology: Hyperkeratotic lesion(s) present on the right sub-third MPJ. Pain on palpation with a central nucleated core noted. Skin is warm, dry and supple bilateral lower extremities. Negative for open lesions or macerations.  Vascular: Palpable pedal pulses bilaterally. No edema or erythema noted. Capillary refill within normal limits.  Neurological: Epicritic and protective threshold grossly intact bilaterally.   Musculoskeletal Exam: Pain on palpation at the keratotic lesion(s) noted as well as to the right lateral great toe. Range of motion within normal limits bilateral. Muscle strength 5/5 in all groups bilateral.  Radiographic Exam: Exostosis of the right lateral great toe noted.   Assessment: 1. Exostosis right lateral great toe  2. Pre-ulcerative callus lesion noted to the right  sub-third MPJ   Plan of Care:  1. Patient evaluated. X-Rays reviewed.  2. Excisional debridement of keratoic lesion(s) using a chisel blade was performed without incident.  3. Dressed area with light dressing. 4. Today we discussed the conservative versus surgical management of the presenting pathology. The patient opts for surgical management. All possible complications and details of the procedure were explained. All patient questions were answered. No guarantees were expressed or implied. 5. Authorization for surgery was initiated today. In-office surgery will consist of exostectomy right great toe.  6. Patient is to return to the clinic morning of in-office surgery.   Goes by [employer].   Edrick Kins, DPM [facility]  Dr. Edrick Kins, [address]                                        [place], [place] [address]                Office [phone]  Fax [phone]

## 2019-01-02 ENCOUNTER — Telehealth: Payer: Self-pay | Admitting: *Deleted

## 2019-01-02 NOTE — Telephone Encounter (Signed)
"  I am calling to schedule my surgery with Dr. Amalia Hailey."  Do you have a date that you like?  "I'd like to do it on January 11, 2019."  We do not have anything available on that date.  He can do it on January 25, 2019.  "I guess if that's his first available, that will be fine.  Can you let me know if there's any cancellations for October 14?  That would be ideal."  I will put you on a wait list for any cancellations for 01/11/2019.

## 2019-01-05 ENCOUNTER — Telehealth: Payer: Self-pay | Admitting: Podiatry

## 2019-01-05 NOTE — Telephone Encounter (Signed)
DATE OF OFFICE SURGERY: 01/25/2019 SURGICAL PROCEDURE: Exostectomy Hallux Rt Toe CPT CODE: 16109 DX CODE: M25.71  Member Number: [id number]  Policy Effective : [id number]  -  03/29/9998   Name: Regina [name]  Date of Birth: June 14, 1965  Member Liability Summary       In-Network   Max Per Benefit Period Year-to-Date Remaining     CoInsurance 20%      Deductible $1250.00 $1250.00     Out-Of-Pocket $4890.00 [id number]      Out-of-Network   Max Per Benefit Period Year-to-Date Remaining     CoInsurance 40%      Deductible $2500.00 $2500.00     Out-Of-Pocket $9780.00 $9780.00   Specialist  SPECIALISTS OFFICE VISIT     In Network Copay Coinsurance $80.00  per  VISITS Not Applicable      Out of Network Copay Coinsurance Not Applicable [id number]  per  [name] Not Applicable [id number]  per  [name]

## 2019-01-11 ENCOUNTER — Other Ambulatory Visit: Payer: BC Managed Care – PPO

## 2019-01-16 ENCOUNTER — Ambulatory Visit
Admission: RE | Admit: 2019-01-16 | Discharge: 2019-01-16 | Disposition: A | Discharge status: Home / Self Care | Payer: BC Managed Care – PPO | Source: Ambulatory Visit | Attending: Internal Medicine | Admitting: Internal Medicine

## 2019-01-16 DIAGNOSIS — R140 Abdominal distension (gaseous): Secondary | ICD-10-CM

## 2019-01-16 MED ORDER — IOPAMIDOL (ISOVUE-300) INJECTION 61%
100.0000 mL | Freq: Once | INTRAVENOUS | Status: AC | PRN
  Administered 2019-01-16: 100 mL via INTRAVENOUS

## 2019-01-24 ENCOUNTER — Other Ambulatory Visit: Payer: Self-pay

## 2019-01-24 ENCOUNTER — Encounter: Payer: Self-pay | Admitting: Internal Medicine

## 2019-01-24 ENCOUNTER — Ambulatory Visit (INDEPENDENT_AMBULATORY_CARE_PROVIDER_SITE_OTHER): Payer: BC Managed Care – PPO | Admitting: Internal Medicine

## 2019-01-24 DIAGNOSIS — K582 Mixed irritable bowel syndrome: Secondary | ICD-10-CM | POA: Diagnosis not present

## 2019-01-24 MED ORDER — AMOXICILLIN-POT CLAVULANATE 875-125 MG PO TABS: 1.0000 | ORAL_TABLET | Freq: Two times a day (BID) | ORAL | 0 refills | Status: DC

## 2019-01-24 MED ORDER — LINACLOTIDE 290 MCG PO CAPS: 290.0000 ug | ORAL_CAPSULE | Freq: Every day | ORAL | 3 refills | Status: DC

## 2019-01-24 MED ORDER — PANTOPRAZOLE SODIUM 40 MG PO TBEC: 40.0000 mg | DELAYED_RELEASE_TABLET | Freq: Every day | ORAL | 3 refills | Status: DC

## 2019-01-24 NOTE — Patient Instructions (Addendum)
These suggestions will probably help you to improve your metabolism if you are not overweight and to lose weight if you are overweight: 1.  Reduce your consumption of sugars and starches.  Eliminate high fructose corn syrup from your diet.  Reduce your consumption of processed foods.  For desserts try to have seasonal fruits, berries, nuts, cheeses or dark chocolate with more than 70% cacao. 2.  Do not snack 3.  You do not have to eat breakfast.  If you choose to have breakfast-eat plain greek yogurt, eggs, oatmeal (without sugar) 4.  Drink water, freshly brewed unsweetened tea (green, black or herbal) or coffee.  Do not drink sodas including diet sodas , juices, beverages sweetened with artificial sweeteners. 5.  Reduce your consumption of refined grains. 6.  Avoid protein drinks such as Optifast, Slim fast etc. Eat chicken, fish, meat, dairy and beans for your sources of protein 7.  Natural unprocessed fats like cold pressed virgin olive oil, butter, coconut oil are good for you.  Eat avocados 8.  Increase your consumption of fiber.  Fruits, berries, vegetables, whole grains, flaxseeds, Chia seeds, beans, popcorn, nuts, oatmeal are good sources of fiber 9.  Use vinegar in your diet, i.e. apple cider vinegar, red wine or balsamic vinegar 10.  You can try fasting.  For example you can skip breakfast and lunch every other day (24-hour fast) 11.  Stress reduction, good night sleep, relaxation, meditation, yoga and other physical activity is likely to help you to maintain low weight too. 12.  If you drink alcohol, limit your alcohol intake to no more than 2 drinks a day.   Mediterranean diet is good for you. ([employer] has a typical Mediterranean cuisine menu) The Mediterranean diet is a way of eating based on the traditional cuisine of countries bordering the [place]. While there is no single definition of the Mediterranean diet, it is typically high in vegetables, fruits, whole grains,  beans, nut and seeds, and olive oil. The main components of Mediterranean diet include: . Daily consumption of vegetables, fruits, whole grains and healthy fats  . Weekly intake of fish, poultry, beans and eggs  . Moderate portions of dairy products  . Limited intake of red meat Other important elements of the Mediterranean diet are sharing meals with family and friends, enjoying a glass of red wine and being physically active. Health benefits of a Mediterranean diet: A traditional Mediterranean diet consisting of large quantities of fresh fruits and vegetables, nuts, fish and olive oil-coupled with physical activity-can reduce your risk of serious mental and physical health problems by: Preventing heart disease and strokes. Following a Mediterranean diet limits your intake of refined breads, processed foods, and red meat, and encourages drinking red wine instead of hard liquor-all factors that can help prevent heart disease and stroke. Keeping you agile. If you're an older adult, the nutrients gained with a Mediterranean diet may reduce your risk of developing muscle weakness and other signs of frailty by about 70 percent. Reducing the risk of Alzheimer's. Research suggests that the Mediterranean diet may improve cholesterol, blood sugar levels, and overall blood vessel health, which in turn may reduce your risk of Alzheimer's disease or dementia. Halving the risk of Parkinson's disease. The high levels of antioxidants in the Mediterranean diet can prevent cells from undergoing a damaging process called oxidative stress, thereby cutting the risk of Parkinson's disease in half. Increasing longevity. By reducing your risk of developing heart disease or cancer with the Mediterranean diet,   you're reducing your risk of death at any age by 20%. Protecting against type 2 diabetes. A Mediterranean diet is rich in fiber which digests slowly, prevents huge swings in blood sugar, and can help you maintain a  healthy weight.    Cabbage soup recipe that will not make you gain weight: Take 1 small head of cabbage, 1 average pack of celery, 4 green peppers, 4 onions, 2 cans diced tomatoes (they are not available without salt), salt and spices to taste.  Chop cabbage, celery, peppers and onions.  And tomatoes and 2-2.5 liters (2.5 quarts) of water so that it would just cover the vegetables.  Bring to boil.  Add spices and salt.  Turn heat to low/medium and simmer for 20-25 minutes.  Naturally, you can make a smaller batch and change some of the ingredients.       Gluten free trial for 4-6 weeks. OK to use gluten-free bread and gluten-free pasta.    Gluten-Free Diet for Celiac Disease, Adult The gluten-free diet includes all foods that do not contain gluten. Gluten is a protein that is found in wheat, rye, barley, and some other grains. Following the gluten-free diet is the only treatment for people with celiac disease. It helps to prevent damage to the intestines and improves or eliminates the symptoms of celiac disease. Following the gluten-free diet requires some planning. It can be challenging at first, but it gets easier with time and practice. There are more gluten-free options available today than ever before. If you need help finding gluten-free foods or if you have questions, talk with your diet and nutrition specialist (registered dietitian) or your health care provider. What do I need to know about a gluten-free diet?  All fruits, vegetables, and meats are safe to eat and do not contain gluten.  When grocery shopping, start by shopping in the produce, meat, and dairy sections. These sections are more likely to contain gluten-free foods. Then move to the aisles that contain packaged foods if you need to.  Read all food labels. Gluten is often added to foods. Always check the ingredient list and look for warnings, such as "may contain gluten."  Talk with your dietitian or health care  provider before taking a gluten-free multivitamin or mineral supplement.  Be aware of gluten-free foods having contact with foods that contain gluten (cross-contamination). This can happen at home and with any processed foods. ? Talk with your health care provider or dietitian about how to reduce the risk of cross-contamination in your home. ? If you have questions about how a food is processed, ask the manufacturer. What key words help to identify gluten? Foods that list any of these key words on the label usually contain gluten:  Wheat, flour, enriched flour, bromated flour, white flour, durum flour, graham flour, phosphated flour, self-rising flour, semolina, farina, barley (malt), rye, and oats.  Starch, dextrin, modified food starch, or cereal.  Thickening, fillers, or emulsifiers.  Malt flavoring, malt extract, or malt syrup.  Hydrolyzed vegetable protein.  In the [place] packaged foods that are gluten-free are required to be labeled "GF." These foods should be easy to identify and are safe to eat. In the [place] food companies are also required to list common food allergens, including wheat, on their labels. Recommended foods Grains  Amaranth, bean flours, 100% buckwheat flour, corn, millet, nut flours or nut meals, GF oats, quinoa, rice, sorghum, teff, rice wafers, pure cornmeal tortillas, popcorn, and hot cereals made from cornmeal. Hominy, rice, wild  rice. Some Asian rice noodles or bean noodles. Arrowroot starch, corn bran, corn flour, corn germ, cornmeal, corn starch, potato flour, potato starch flour, and rice bran. Plain, brown, and sweet rice flours. Rice polish, soy flour, and tapioca starch. Vegetables  All plain fresh, frozen, and canned vegetables. Fruits  All plain fresh, frozen, canned, and dried fruits, and 100% fruit juices. Meats and other protein foods  All fresh beef, pork, poultry, fish, seafood, and eggs. Fish canned in water, oil, brine, or vegetable broth.  Plain nuts and seeds, peanut butter. Some lunch meat and some frankfurters. Dried beans, dried peas, and lentils. Dairy  Fresh plain, dry, evaporated, or condensed milk. Cream, butter, sour cream, whipping cream, and most yogurts. Unprocessed cheese, most processed cheeses, some cottage cheese, some cream cheeses. Beverages  Coffee, tea, most herbal teas. Carbonated beverages and some root beers. Wine, sake, and distilled spirits, such as gin, vodka, and whiskey. Most hard ciders. Fats and oils  Butter, margarine, vegetable oil, hydrogenated butter, olive oil, shortening, lard, cream, and some mayonnaise. Some commercial salad dressings. Olives. Sweets and desserts  Sugar, honey, some syrups, molasses, jelly, and jam. Plain hard candy, marshmallows, and gumdrops. Pure cocoa powder. Plain chocolate. Custard and some pudding mixes. Gelatin desserts, sorbets, frozen ice pops, and sherbet. Cake, cookies, and other desserts prepared with allowed flours. Some commercial ice creams. Cornstarch, tapioca, and rice puddings. Seasoning and other foods  Some canned or frozen soups. Monosodium glutamate (MSG). Cider, rice, and wine vinegar. Baking soda and baking powder. Cream of tartar. Baking and nutritional yeast. Certain soy sauces made without wheat (ask your dietitian about specific brands that are allowed). Nuts, coconut, and chocolate. Salt, pepper, herbs, spices, flavoring extracts, imitation or artificial flavorings, natural flavorings, and food colorings. Some medicines and supplements. Some lip glosses and other cosmetics. Rice syrups. The items listed may not be a complete list. Talk with your dietitian about what dietary choices are best for you. Foods to avoid Grains  Barley, bran, bulgur, couscous, cracked wheat, Enigma, farro, graham, malt, matzo, semolina, wheat germ, and all wheat and rye cereals including spelt and kamut. Cereals containing malt as a flavoring, such as rice cereal.  Noodles, spaghetti, macaroni, most packaged rice mixes, and all mixes containing wheat, rye, barley, or triticale. Vegetables  Most creamed vegetables and most vegetables canned in sauces. Some commercially prepared vegetables and salads. Fruits  Thickened or prepared fruits and some pie fillings. Some fruit snacks and fruit roll-ups. Meats and other protein foods  Any meat or meat alternative containing wheat, rye, barley, or gluten stabilizers. These are often marinated or packaged meats and lunch meats. Bread-containing products, such as Swiss steak, croquettes, meatballs, and meatloaf. Most tuna canned in vegetable broth and [place] with hydrolyzed vegetable protein (HVP) injected as part of the basting. Seitan. Imitation fish. Eggs in sauces made from ingredients to avoid. Dairy  Commercial chocolate milk drinks and malted milk. Some non-dairy creamers. Any cheese product containing ingredients to avoid. Beverages  Certain cereal beverages. Beer, ale, malted milk, and some root beers. Some hard ciders. Some instant flavored coffees. Some herbal teas made with barley or with barley malt added. Fats and oils  Some commercial salad dressings. Sour cream containing modified food starch. Sweets and desserts  Some toffees. Chocolate-coated nuts (may be rolled in wheat flour) and some commercial candies and candy bars. Most cakes, cookies, donuts, pastries, and other baked goods. Some commercial ice cream. Ice cream cones. Commercially prepared mixes for cakes, cookies,  and other desserts. Bread pudding and other puddings thickened with flour. Products containing brown rice syrup made with barley malt enzyme. Desserts and sweets made with malt flavoring. Seasoning and other foods  Some curry powders, some dry seasoning mixes, some gravy extracts, some meat sauces, some ketchups, some prepared mustards, and horseradish. Certain soy sauces. Malt vinegar. Bouillon and bouillon cubes that contain  HVP. Some chip dips, and some chewing gum. Yeast extract. Brewer's yeast. Caramel color. Some medicines and supplements. Some lip glosses and other cosmetics. The items listed may not be a complete list. Talk with your dietitian about what dietary choices are best for you. Summary  Gluten is a protein that is found in wheat, rye, barley, and some other grains. The gluten-free diet includes all foods that do not contain gluten.  If you need help finding gluten-free foods or if you have questions, talk with your diet and nutrition specialist (registered dietitian) or your health care provider.  Read all food labels. Gluten is often added to foods. Always check the ingredient list and look for warnings, such as "may contain gluten." This information is not intended to replace advice given to you by your health care provider. Make sure you discuss any questions you have with your health care provider. Document Released: 03/16/2005 Document Revised: 12/30/2015 Document Reviewed: 12/30/2015 Elsevier Interactive Patient Education  2018 [place] DATA:  Abdominal distention. Gastritis. Nausea and constipation. History of previous hysterectomy, anal fissure repair, ovarian cyst removal, fibroid surgery and appendectomy  EXAM: CT ABDOMEN AND PELVIS WITH CONTRAST  TECHNIQUE: Multidetector CT imaging of the abdomen and pelvis was performed using the standard protocol following bolus administration of intravenous contrast.  CONTRAST:  129mL ISOVUE-300 IOPAMIDOL (ISOVUE-300) INJECTION 61%  COMPARISON:  CT abdomen and pelvis-01/13/2012  FINDINGS: Lower chest: Limited visualization of the lower thorax demonstrates minimal dependent subpleural ground-glass atelectasis. Minimal atelectasis seen within the left costophrenic angle. No discrete focal airspace opacities. No pleural effusion.  Normal heart size.  No pericardial effusion.  Hepatobiliary: Normal hepatic  contour. No discrete hepatic lesions. Normal appearance of the gallbladder given degree distention. No radiopaque gallstones. No intra or extrahepatic biliary ductal dilatation. No ascites.  Pancreas: Normal appearance of the pancreas.  Spleen: Normal appearance of the spleen. Note is made of a small splenule about the tip of the spleen.  Adrenals/Urinary Tract: There is symmetric enhancement and excretion of the bilateral kidneys. No definite renal stones on this postcontrast examination. No discrete renal lesions. No urinary obstruction or perinephric stranding.  Normal appearance the bilateral adrenal glands.  Normal appearance of the urinary bladder given underdistention.  Stomach/Bowel: Ingested enteric contrast extends to the level of the descending colon. Large colonic stool burden without evidence of enteric obstruction. Apparent circumferential bowel wall thickening involving solitary short-segment loop of small bowel within midline of the abdomen (representative image 42, series 2), is favored to represent phase of peristalsis. Otherwise, no discrete areas of bowel wall thickening. Scattered minimal colonic diverticulosis without evidence of superimposed acute diverticulitis. No hiatal hernia. No pneumoperitoneum, pneumatosis or portal venous gas.  Vascular/Lymphatic: Normal caliber the abdominal aorta. The major branch vessels of the abdominal aorta appear patent on this non CTA examination.  No bulky retroperitoneal, mesenteric, pelvic or inguinal lymphadenopathy.  Reproductive: Post hysterectomy. No discrete adnexal lesion. No free fluid the pelvic cul-de-sac.  Other: Small mesenteric fat containing periumbilical hernia. There is minimal subcutaneous edema about the midline low back.  Musculoskeletal: No acute or  aggressive osseous abnormalities. Note is made of a bone island within the left ilium.  IMPRESSION: 1. Large colonic stool burden  without evidence of enteric obstruction. 2. Scattered colonic diverticulosis without evidence of superimposed acute diverticulitis. 3. Otherwise, unremarkable CT scan of the abdomen and pelvis.   Electronically Signed   By: Sandi Mariscal M.D.   On: 01/16/2019 12:53   Result History  CT Abdomen Pelvis W Contrast (Order [id number]) on 01/16/2019 - Order Result History Report  Result Notes for CT Abdomen Pelvis W Contrast  Notes recorded by Lorrin Jackson, CMA on 01/20/2019 at 4:16 PM EDT  LVM for pt to call back in regards to message below.  ------   Notes recorded by Binnie Rail, MD on 01/20/2019 at 1:12 PM EDT  Consider using a mild stool softener. If she can use the natural things she has used in the past that is ok, but just needs to get the constipation controlled. Atelectasis is minimal and not a concern - it is just an area where the lungs were not open - she may not have taken a deep breath when the scan was done. It is not related to her previous nodule. Hiatal hernias typically do not go away. The bowel thickening is just an area where the colon was contracting when the picture was taking - it is not an infection or concern for cancer  ------   Notes recorded by Romana Juniper, RN on 01/19/2019 at 4:42 PM EDT  Patient returned call regarding her CT scan- she has several concerns. Patient states if she starts the laxative- she will need to be out of work because she can not work and use laxative at the same time. Patient is requesting a work note. Patient also has concerns about the report in the lung section- she states she has a spot in the lung before and wants to know if the atelectasis has anything to do with it- what is significance, she is concerned about the comment about wall thickening in the colon and the significance of that, also she wants to know if a hiatal hernia can just go away.  Told patient I would make PCP aware of her concerns and she may need consult to  review concerns and question- but will send note high priority due to the work note request.  ------   Notes recorded by Lorrin Jackson, CMA on 01/18/2019 at 4:23 PM EDT  Tried calling pt with results. No answer and VM was full.  ------   Notes recorded by Binnie Rail, MD on 01/17/2019 at 7:39 AM EDT  CT of her abdomen shows large amount of stool. No other concerning findings. She should establish a more aggressive bowel regimen-May need to take a stool softener or MiraLAX on a daily basis.

## 2019-01-24 NOTE — Assessment & Plan Note (Addendum)
  Gluten free and milk free diet helped in the past Diet/fasting Protonix po Labs Miralax did not help Try Linzess, Creon

## 2019-01-24 NOTE — Progress Notes (Signed)
Subjective:  Patient ID: Regina [name], female    DOB: [birth year]  Age: 53 y.o. MRN: [id number]  CC: No chief complaint on file.   HPI ALESIA BUSHELL presents for indigestion, GERD/acid, fullness, burping; worse in supine position x 3-4 months. Lately more pain in the LLQ. No n/v. No fever C/o wt gain, poor diet.  She is complaining of being very constipated x 1 mo or longer.  Stools look like threads. The patient tried to lose weight on a keto diet - did not like it...  Outpatient Medications Prior to Visit  Medication Sig Dispense Refill  . albuterol (PROAIR HFA) 108 (90 Base) MCG/ACT inhaler Inhale 2 puffs into the lungs every 6 (six) hours as needed for wheezing or shortness of breath (before exercising). 1 Inhaler 6  . CASCARA SAGRADA PO Take by mouth.    . Cholecalciferol (GNP VITAMIN D) 1000 units tablet Take 2 tablets (2,000 Units total) by mouth daily. 100 tablet 3  . cyanocobalamin (,VITAMIN B-12,) 1000 MCG/ML injection Inject 1 mL (1,000 mcg total) into the skin every 14 (fourteen) days. 10 mL 11  . fluocinonide (LIDEX) 0.05 % external solution APPLY ON THE SCALP DAILY    . gabapentin (NEURONTIN) 300 MG capsule nightly 30 capsule 3  . omeprazole (PRILOSEC) 20 MG capsule Take 1 capsule (20 mg total) by mouth daily. Take 30 minutes prior to breakfast 30 capsule 3  . Turmeric 500 MG TABS Take by mouth 2 (two) times daily.     No facility-administered medications prior to visit.     ROS: Review of Systems  Constitutional: Positive for fatigue and unexpected weight change. Negative for activity change, appetite change and chills.  HENT: Negative for congestion, mouth sores and sinus pressure.   Eyes: Negative for visual disturbance.  Respiratory: Negative for cough and chest tightness.   Gastrointestinal: Positive for abdominal distention, abdominal pain and diarrhea. Negative for blood in stool, nausea, rectal pain and vomiting.  Genitourinary: Negative for difficulty  urinating, frequency and vaginal pain.  Musculoskeletal: Negative for back pain and gait problem.  Skin: Negative for pallor and rash.  Neurological: Negative for dizziness, tremors, weakness, numbness and headaches.  Psychiatric/Behavioral: Negative for confusion and sleep disturbance. The patient is nervous/anxious.     Objective:  BP 118/64 (BP Location: Left Arm, Patient Position: Sitting, Cuff Size: Normal)   Pulse 67   Temp 98.6 F (37 C) (Oral)   Ht 5\' 4"  (1.626 m)   Wt 179 lb (81.2 kg)   SpO2 99%   BMI 30.73 kg/m   BP Readings from Last 3 Encounters:  01/24/19 118/64  12/27/18 106/70  08/31/18 92/62    Wt Readings from Last 3 Encounters:  01/24/19 179 lb (81.2 kg)  12/27/18 177 lb (80.3 kg)  09/02/18 175 lb (79.4 kg)    Physical Exam Constitutional:      General: She is not in acute distress.    Appearance: Normal appearance. She is well-developed. She is obese.  HENT:     Head: Normocephalic.     Right Ear: External ear normal.     Left Ear: External ear normal.     Nose: Nose normal.  Eyes:     General:        Right eye: No discharge.        Left eye: No discharge.     Conjunctiva/sclera: Conjunctivae normal.     Pupils: Pupils are equal, round, and reactive to light.  Neck:  Musculoskeletal: Normal range of motion and neck supple.     Thyroid: No thyromegaly.     Vascular: No JVD.     Trachea: No tracheal deviation.  Cardiovascular:     Rate and Rhythm: Normal rate and regular rhythm.     Heart sounds: Normal heart sounds.  Pulmonary:     Effort: No respiratory distress.     Breath sounds: No stridor. No wheezing.  Abdominal:     General: Bowel sounds are normal. There is distension.     Palpations: Abdomen is soft. There is no mass.     Tenderness: There is abdominal tenderness. There is no guarding or rebound.  Musculoskeletal:        General: No tenderness.  Lymphadenopathy:     Cervical: No cervical adenopathy.  Skin:    Findings: No  erythema or rash.  Neurological:     Mental Status: She is oriented to person, place, and time.     Cranial Nerves: No cranial nerve deficit.     Motor: No abnormal muscle tone.     Coordination: Coordination normal.     Deep Tendon Reflexes: Reflexes normal.  Psychiatric:        Behavior: Behavior normal.        Thought Content: Thought content normal.        Judgment: Judgment normal.   LLQ - painful  Lab Results  Component Value Date   WBC 5.8 12/27/2018   HGB 13.0 12/27/2018   HCT 38.7 12/27/2018   PLT 330.0 12/27/2018   GLUCOSE 90 12/27/2018   CHOL 213 (H) 08/23/2018   TRIG 118 08/23/2018   HDL 59 08/23/2018   LDLCALC 130 (H) 08/23/2018   ALT 17 12/27/2018   AST 15 12/27/2018   NA 138 12/27/2018   K 4.2 12/27/2018   CL 102 12/27/2018   CREATININE 0.71 12/27/2018   BUN 12 12/27/2018   CO2 28 12/27/2018   TSH 1.20 12/27/2018   INR 1.16 06/29/2015   HGBA1C 5.4 08/23/2018    Ct Abdomen Pelvis W Contrast  Result Date: 01/16/2019 CLINICAL DATA:  Abdominal distention. Gastritis. Nausea and constipation. History of previous hysterectomy, anal fissure repair, ovarian cyst removal, fibroid surgery and appendectomy EXAM: CT ABDOMEN AND PELVIS WITH CONTRAST TECHNIQUE: Multidetector CT imaging of the abdomen and pelvis was performed using the standard protocol following bolus administration of intravenous contrast. CONTRAST:  177mL ISOVUE-300 IOPAMIDOL (ISOVUE-300) INJECTION 61% COMPARISON:  CT abdomen and pelvis-01/13/2012 FINDINGS: Lower chest: Limited visualization of the lower thorax demonstrates minimal dependent subpleural ground-glass atelectasis. Minimal atelectasis seen within the left costophrenic angle. No discrete focal airspace opacities. No pleural effusion. Normal heart size.  No pericardial effusion. Hepatobiliary: Normal hepatic contour. No discrete hepatic lesions. Normal appearance of the gallbladder given degree distention. No radiopaque gallstones. No intra or  extrahepatic biliary ductal dilatation. No ascites. Pancreas: Normal appearance of the pancreas. Spleen: Normal appearance of the spleen. Note is made of a small splenule about the tip of the spleen. Adrenals/Urinary Tract: There is symmetric enhancement and excretion of the bilateral kidneys. No definite renal stones on this postcontrast examination. No discrete renal lesions. No urinary obstruction or perinephric stranding. Normal appearance the bilateral adrenal glands. Normal appearance of the urinary bladder given underdistention. Stomach/Bowel: Ingested enteric contrast extends to the level of the descending colon. Large colonic stool burden without evidence of enteric obstruction. Apparent circumferential bowel wall thickening involving solitary short-segment loop of small bowel within midline of the abdomen (representative image 42,  series 2), is favored to represent phase of peristalsis. Otherwise, no discrete areas of bowel wall thickening. Scattered minimal colonic diverticulosis without evidence of superimposed acute diverticulitis. No hiatal hernia. No pneumoperitoneum, pneumatosis or portal venous gas. Vascular/Lymphatic: Normal caliber the abdominal aorta. The major branch vessels of the abdominal aorta appear patent on this non CTA examination. No bulky retroperitoneal, mesenteric, pelvic or inguinal lymphadenopathy. Reproductive: Post hysterectomy. No discrete adnexal lesion. No free fluid the pelvic cul-de-sac. Other: Small mesenteric fat containing periumbilical hernia. There is minimal subcutaneous edema about the midline low back. Musculoskeletal: No acute or aggressive osseous abnormalities. Note is made of a bone island within the left ilium. IMPRESSION: 1. Large colonic stool burden without evidence of enteric obstruction. 2. Scattered colonic diverticulosis without evidence of superimposed acute diverticulitis. 3. Otherwise, unremarkable CT scan of the abdomen and pelvis. Electronically  Signed   By: Sandi Mariscal M.D.   On: 01/16/2019 12:53    Assessment & Plan:   There are no diagnoses linked to this encounter.   No orders of the defined types were placed in this encounter.    Follow-up: No follow-ups on file.  Walker Kehr, MD

## 2019-01-25 ENCOUNTER — Telehealth: Payer: Self-pay | Admitting: *Deleted

## 2019-01-25 ENCOUNTER — Ambulatory Visit: Payer: BC Managed Care – PPO | Admitting: Podiatry

## 2019-01-25 ENCOUNTER — Encounter: Payer: Self-pay | Admitting: Internal Medicine

## 2019-01-25 NOTE — Telephone Encounter (Signed)
Patient called and left a message on the general mailbox on yesterday.  The message was transferred to me this morning.  It was after 5 pm when she called yesterday.    She stated, "I am scheduled for a procedure in the morning but after leaving my doctor's office today.  I am not going to be able to do the procedure tomorrow and will need to reschedule.  Thank you and sorry for the last notice.  My appointment was at 4 pm and I just got out."

## 2019-01-25 NOTE — Telephone Encounter (Signed)
I'm returning your call.  You want to reschedule your surgery?  "Yes, I went and saw my doctor.  He said I have Diverticulitis due to stress.  He said I didn't need anymore stress.  So he told me to hold off on the surgery.  I'd like to reschedule to a month out."  Dr. Amalia Hailey can do it on March 01, 2019.  "What day is that?"  It's a Wednesday.  "That's perfect.  What time do I need to be there?"  You need to be here at 7:45 am for your surgery.

## 2019-02-01 ENCOUNTER — Encounter: Payer: BC Managed Care – PPO | Admitting: Podiatry

## 2019-02-08 ENCOUNTER — Encounter: Payer: BC Managed Care – PPO | Admitting: Podiatry

## 2019-02-13 ENCOUNTER — Encounter: Payer: BC Managed Care – PPO | Admitting: Podiatry

## 2019-02-14 ENCOUNTER — Ambulatory Visit (INDEPENDENT_AMBULATORY_CARE_PROVIDER_SITE_OTHER): Payer: BC Managed Care – PPO | Admitting: Internal Medicine

## 2019-02-14 ENCOUNTER — Encounter: Payer: Self-pay | Admitting: Internal Medicine

## 2019-02-14 ENCOUNTER — Other Ambulatory Visit: Payer: Self-pay

## 2019-02-14 DIAGNOSIS — R109 Unspecified abdominal pain: Secondary | ICD-10-CM

## 2019-02-14 DIAGNOSIS — K582 Mixed irritable bowel syndrome: Secondary | ICD-10-CM

## 2019-02-14 DIAGNOSIS — M797 Fibromyalgia: Secondary | ICD-10-CM | POA: Diagnosis not present

## 2019-02-14 DIAGNOSIS — E6609 Other obesity due to excess calories: Secondary | ICD-10-CM

## 2019-02-14 DIAGNOSIS — R070 Pain in throat: Secondary | ICD-10-CM

## 2019-02-14 DIAGNOSIS — M5412 Radiculopathy, cervical region: Secondary | ICD-10-CM

## 2019-02-14 DIAGNOSIS — E538 Deficiency of other specified B group vitamins: Secondary | ICD-10-CM

## 2019-02-14 MED ORDER — CEPHALEXIN 500 MG PO CAPS: 500.0000 mg | ORAL_CAPSULE | Freq: Four times a day (QID) | ORAL | 0 refills | Status: DC

## 2019-02-14 NOTE — Assessment & Plan Note (Addendum)
Use yoga

## 2019-02-14 NOTE — Assessment & Plan Note (Signed)
Worse Ref to PT Yoga

## 2019-02-14 NOTE — Progress Notes (Signed)
Subjective:  Patient ID: Regina [name], female    DOB: 1965/11/24  Age: 53 y.o. MRN: [id number]  CC: No chief complaint on file.   HPI SYANNE HAMEL presents for IBS with abd bloating, constipations, B12 def Tried Linzess, Miralax C/o neck pain L side under the jaw, L side pain - LLQ   Outpatient Medications Prior to Visit  Medication Sig Dispense Refill  . amoxicillin-clavulanate (AUGMENTIN) 875-125 MG tablet Take 1 tablet by mouth 2 (two) times daily. 20 tablet 0  . CASCARA SAGRADA PO Take by mouth.    . Cholecalciferol (GNP VITAMIN D) 1000 units tablet Take 2 tablets (2,000 Units total) by mouth daily. 100 tablet 3  . cyanocobalamin (,VITAMIN B-12,) 1000 MCG/ML injection Inject 1 mL (1,000 mcg total) into the skin every 14 (fourteen) days. 10 mL 11  . fluocinonide (LIDEX) 0.05 % external solution APPLY ON THE SCALP DAILY    . gabapentin (NEURONTIN) 300 MG capsule nightly 30 capsule 3  . linaclotide (LINZESS) 290 MCG CAPS capsule Take 1 capsule (290 mcg total) by mouth daily. 90 capsule 3  . omeprazole (PRILOSEC) 20 MG capsule Take 1 capsule (20 mg total) by mouth daily. Take 30 minutes prior to breakfast 30 capsule 3  . pantoprazole (PROTONIX) 40 MG tablet Take 1 tablet (40 mg total) by mouth daily. 90 tablet 3  . Turmeric 500 MG TABS Take by mouth 2 (two) times daily.    Marland Kitchen albuterol (PROAIR HFA) 108 (90 Base) MCG/ACT inhaler Inhale 2 puffs into the lungs every 6 (six) hours as needed for wheezing or shortness of breath (before exercising). 1 Inhaler 6   No facility-administered medications prior to visit.     ROS: Review of Systems  Constitutional: Negative for activity change, appetite change, chills, fatigue and unexpected weight change.  HENT: Negative for congestion, mouth sores and sinus pressure.   Eyes: Negative for visual disturbance.  Respiratory: Negative for cough and chest tightness.   Gastrointestinal: Positive for constipation. Negative for abdominal pain  and nausea.  Genitourinary: Negative for difficulty urinating, frequency and vaginal pain.  Musculoskeletal: Positive for arthralgias and neck pain. Negative for back pain and gait problem.  Skin: Negative for pallor and rash.  Neurological: Negative for dizziness, tremors, weakness, numbness and headaches.  Psychiatric/Behavioral: Negative for confusion, sleep disturbance and suicidal ideas.    Objective:  BP 120/68 (BP Location: Left Arm, Patient Position: Sitting, Cuff Size: Normal)   Pulse 73   Temp 98.4 F (36.9 C) (Oral)   Ht 5\' 4"  (1.626 m)   Wt 172 lb (78 kg)   SpO2 96%   BMI 29.52 kg/m   BP Readings from Last 3 Encounters:  02/14/19 120/68  01/24/19 118/64  12/27/18 106/70    Wt Readings from Last 3 Encounters:  02/14/19 172 lb (78 kg)  01/24/19 179 lb (81.2 kg)  12/27/18 177 lb (80.3 kg)    Physical Exam Constitutional:      General: She is not in acute distress.    Appearance: She is well-developed.  HENT:     Head: Normocephalic.     Right Ear: External ear normal.     Left Ear: External ear normal.     Nose: Nose normal.  Eyes:     General:        Right eye: No discharge.        Left eye: No discharge.     Conjunctiva/sclera: Conjunctivae normal.     Pupils: Pupils are equal,  round, and reactive to light.  Neck:     Musculoskeletal: Normal range of motion and neck supple.     Thyroid: No thyromegaly.     Vascular: No JVD.     Trachea: No tracheal deviation.  Cardiovascular:     Rate and Rhythm: Normal rate and regular rhythm.     Heart sounds: Normal heart sounds.  Pulmonary:     Effort: No respiratory distress.     Breath sounds: No stridor. No wheezing.  Abdominal:     General: Bowel sounds are normal. There is no distension.     Palpations: Abdomen is soft. There is no mass.     Tenderness: There is no abdominal tenderness. There is no guarding or rebound.  Musculoskeletal:        General: No tenderness.  Lymphadenopathy:     Cervical:  No cervical adenopathy.  Skin:    Findings: No erythema or rash.  Neurological:     Mental Status: She is oriented to person, place, and time.     Cranial Nerves: No cranial nerve deficit.     Motor: No abnormal muscle tone.     Coordination: Coordination normal.     Deep Tendon Reflexes: Reflexes normal.  Psychiatric:        Behavior: Behavior normal.        Thought Content: Thought content normal.        Judgment: Judgment normal.   neck - stiff L neck w/a painful spot LLQ tender  Lab Results  Component Value Date   WBC 5.8 12/27/2018   HGB 13.0 12/27/2018   HCT 38.7 12/27/2018   PLT 330.0 12/27/2018   GLUCOSE 90 12/27/2018   CHOL 213 (H) 08/23/2018   TRIG 118 08/23/2018   HDL 59 08/23/2018   LDLCALC 130 (H) 08/23/2018   ALT 17 12/27/2018   AST 15 12/27/2018   NA 138 12/27/2018   K 4.2 12/27/2018   CL 102 12/27/2018   CREATININE 0.71 12/27/2018   BUN 12 12/27/2018   CO2 28 12/27/2018   TSH 1.20 12/27/2018   INR 1.16 06/29/2015   HGBA1C 5.4 08/23/2018    Ct Abdomen Pelvis W Contrast  Result Date: 01/16/2019 CLINICAL DATA:  Abdominal distention. Gastritis. Nausea and constipation. History of previous hysterectomy, anal fissure repair, ovarian cyst removal, fibroid surgery and appendectomy EXAM: CT ABDOMEN AND PELVIS WITH CONTRAST TECHNIQUE: Multidetector CT imaging of the abdomen and pelvis was performed using the standard protocol following bolus administration of intravenous contrast. CONTRAST:  139mL ISOVUE-300 IOPAMIDOL (ISOVUE-300) INJECTION 61% COMPARISON:  CT abdomen and pelvis-01/13/2012 FINDINGS: Lower chest: Limited visualization of the lower thorax demonstrates minimal dependent subpleural ground-glass atelectasis. Minimal atelectasis seen within the left costophrenic angle. No discrete focal airspace opacities. No pleural effusion. Normal heart size.  No pericardial effusion. Hepatobiliary: Normal hepatic contour. No discrete hepatic lesions. Normal appearance  of the gallbladder given degree distention. No radiopaque gallstones. No intra or extrahepatic biliary ductal dilatation. No ascites. Pancreas: Normal appearance of the pancreas. Spleen: Normal appearance of the spleen. Note is made of a small splenule about the tip of the spleen. Adrenals/Urinary Tract: There is symmetric enhancement and excretion of the bilateral kidneys. No definite renal stones on this postcontrast examination. No discrete renal lesions. No urinary obstruction or perinephric stranding. Normal appearance the bilateral adrenal glands. Normal appearance of the urinary bladder given underdistention. Stomach/Bowel: Ingested enteric contrast extends to the level of the descending colon. Large colonic stool burden without evidence of enteric obstruction.  Apparent circumferential bowel wall thickening involving solitary short-segment loop of small bowel within midline of the abdomen (representative image 42, series 2), is favored to represent phase of peristalsis. Otherwise, no discrete areas of bowel wall thickening. Scattered minimal colonic diverticulosis without evidence of superimposed acute diverticulitis. No hiatal hernia. No pneumoperitoneum, pneumatosis or portal venous gas. Vascular/Lymphatic: Normal caliber the abdominal aorta. The major branch vessels of the abdominal aorta appear patent on this non CTA examination. No bulky retroperitoneal, mesenteric, pelvic or inguinal lymphadenopathy. Reproductive: Post hysterectomy. No discrete adnexal lesion. No free fluid the pelvic cul-de-sac. Other: Small mesenteric fat containing periumbilical hernia. There is minimal subcutaneous edema about the midline low back. Musculoskeletal: No acute or aggressive osseous abnormalities. Note is made of a bone island within the left ilium. IMPRESSION: 1. Large colonic stool burden without evidence of enteric obstruction. 2. Scattered colonic diverticulosis without evidence of superimposed acute diverticulitis.  3. Otherwise, unremarkable CT scan of the abdomen and pelvis. Electronically Signed   By: Sandi Mariscal M.D.   On: 01/16/2019 12:53    Assessment & Plan:   There are no diagnoses linked to this encounter.   No orders of the defined types were placed in this encounter.    Follow-up: No follow-ups on file.  Walker Kehr, MD

## 2019-02-14 NOTE — Assessment & Plan Note (Signed)
L side Empiric Keflex ENT if not better Off work x 1 week

## 2019-02-14 NOTE — Assessment & Plan Note (Signed)
LLQ - diverticulosis Use Linzess

## 2019-02-14 NOTE — Assessment & Plan Note (Signed)
On B12 

## 2019-02-14 NOTE — Assessment & Plan Note (Signed)
Better on gluten free diet 

## 2019-02-14 NOTE — Patient Instructions (Signed)
Regina [name] [email or web address]

## 2019-02-21 ENCOUNTER — Telehealth: Payer: Self-pay | Admitting: *Deleted

## 2019-02-21 NOTE — Telephone Encounter (Signed)
DOS 03/01/2019 EXOSTECTOMY HALLUX RIGHT FOOT - [id number]  BCBS: Eligibility Date - 04/30/2018 - 03/29/9998   In-Network    Max Per Benefit Period Year-to-Date Remaining  CoInsurance  20%   Deductible  $1250.00 $525.41  Out-Of-Pocket  $4890.00 $3437.60   SPECIALISTS OFFICE VISIT  In Network  [employer] Coinsurance  $80.00 per VISITS  Not Applicable

## 2019-03-01 ENCOUNTER — Telehealth: Payer: Self-pay | Admitting: *Deleted

## 2019-03-01 ENCOUNTER — Other Ambulatory Visit: Payer: Self-pay

## 2019-03-01 ENCOUNTER — Ambulatory Visit: Payer: BC Managed Care – PPO | Admitting: Podiatry

## 2019-03-01 DIAGNOSIS — M898X7 Other specified disorders of bone, ankle and foot: Secondary | ICD-10-CM

## 2019-03-01 NOTE — Telephone Encounter (Signed)
I called pt and told her to remove the outside dressing to the gauze after removing the boot and dangle the surgery foot for about 15 minutes. Pt states she just took one strip of the outer dressing off and had immediate relief. I told pt the outer dressing was put on snuggly to staunch any bleeding. Pt states she had cramping in the foot for so long she developed cramping in the leg and would like to use heat to the calf. Pt denies heat or hardness to the calf and states she is prone to cramping and told the assistant that dressed the toe so. Dr. Amalia Hailey states she can apply heat to the calf. Pt states she has more coflex and asked if she should cover the gauze with that. I told her that would be fine it would keep the dressing in place but not to apply to tight and still keep foot at least level with the hip and perform only ADL. Pt states understanding.

## 2019-03-01 NOTE — Telephone Encounter (Signed)
Pt states her surgery foot from today's procedure has a cold sensation and she has cramping in her foot and gets relief from putting the foot down.

## 2019-03-02 ENCOUNTER — Encounter: Payer: Self-pay | Admitting: Physical Therapy

## 2019-03-02 ENCOUNTER — Ambulatory Visit: Payer: BC Managed Care – PPO | Attending: Internal Medicine | Admitting: Physical Therapy

## 2019-03-02 ENCOUNTER — Other Ambulatory Visit: Payer: Self-pay

## 2019-03-02 DIAGNOSIS — M542 Cervicalgia: Secondary | ICD-10-CM | POA: Insufficient documentation

## 2019-03-02 DIAGNOSIS — M6281 Muscle weakness (generalized): Secondary | ICD-10-CM | POA: Diagnosis present

## 2019-03-02 DIAGNOSIS — M62838 Other muscle spasm: Secondary | ICD-10-CM

## 2019-03-02 NOTE — Patient Instructions (Signed)
TENS UNIT  This is helpful for muscle pain and spasm.   Search and Purchase a TENS 7000 2nd edition at www.tenspros.com or www.amazon.com  (It should be less than $30)     TENS unit instructions:   Do not shower or bathe with the unit on  Turn the unit off before removing electrodes or batteries  If the electrodes lose stickiness add a drop of water to the electrodes after they are disconnected from the unit and place on plastic sheet. If you continued to have difficulty, call the TENS unit company to purchase more electrodes.  Do not apply lotion on the skin area prior to use. Make sure the skin is clean and dry as this will help prolong the life of the electrodes.  After use, always check skin for unusual red areas, rash or other skin difficulties. If there are any skin problems, does not apply electrodes to the same area.  Never remove the electrodes from the unit by pulling the wires.  Do not use the TENS unit or electrodes other than as directed.  Do not change electrode placement without consulting your therapist or physician.  Keep 2 fingers with between each electrode.    Access Code: [facility]  URL: [email or web address]  Date: 03/02/2019  Prepared by: Ruben Im   Exercises Supine Chin Tuck - 7 reps - 1 sets - 3 hold - 2x daily - 7x weekly

## 2019-03-02 NOTE — Therapy (Signed)
[facility] [address], [place], [place], [address] Phone: [phone]   Fax:  [phone]  Physical Therapy Evaluation  Patient Details  Name: Regina [name] MRN: [id number] Date of Birth: 03-27-1966 Referring Provider (PT): Dr. Alain Marion   Encounter Date: 03/02/2019  PT End of Session - 03/02/19 1843    Visit Number  1    Date for PT Re-Evaluation  04/27/19    Authorization Type  BCBS    PT Start Time  0801    PT Stop Time  0844    PT Time Calculation (min)  43 min    Activity Tolerance  Patient tolerated treatment well       Past Medical History:  Diagnosis Date  . Allergic urticaria   . ARTHRALGIA   . Arthritis   . Atrial fibrillation ([place])   . B12 DEFICIENCY   . CFS (chronic fatigue syndrome)   . Endometriosis   . Epidural hemorrhage with loss of consciousness ([place]) 06/29/15  . Fibroid   . Fibromyalgia   . Fracture of cervical vertebra, C6 ([place]) 06/29/15  . Gastritis   . GERD   . Heart murmur   . History of endometriosis   . IBS (irritable bowel syndrome)   . Internal hemorrhoids   . MVP (mitral valve prolapse)   . Osteoarthritis   . Rosacea   . Skin cancer of arm, right   . Thoracic compression fracture ([place]) 06/29/15   T2-T6    Past Surgical History:  Procedure Laterality Date  . ABDOMINAL HYSTERECTOMY     partial  . ABDOMINAL HYSTERECTOMY    . ANAL FISSURE REPAIR     Dr Rise [name] 2009  . APPENDECTOMY    . COLPOSCOPY    . HEMORRHOID SURGERY     Dr. Rise [name] 2009  . OVARIAN CYST REMOVAL    . RT BUNIONECTOMT    . TONSILLECTOMY AND ADENOIDECTOMY    . TUBAL LIGATION    . UTERINE FIBROID SURGERY      There were no vitals filed for this visit.   Subjective Assessment - 03/02/19 0806    Subjective  Have GI issues recently and changed diet.  The doctor was concerned that I was having anterior neck pain and decreased ROM.  They thought I had a tooth infection but they said not causing  the problem.  Referred to ENT.  I wake up at night b/c of pain that has been the same since 2017.  My sleep has improved a little since I changed my diet.  Mediteranean and bone broth.    Pertinent History  broke neck C6, T3-7 in 2017 no hardware;  fracture right shoulder;  dizziness;  memory issues:  states she does better not being overloaded with instructions/ HEP;  states she is not motivated to exercise;  [facility] at [facility] ([facility]) and [facility] for mostly massage and limited ex    Limitations  Other (comment)    How long can you sit comfortably?  as needed    Diagnostic tests  CT of abdomen;  some other scan noted OA in spine    Patient Stated Goals  increase ROM;  less discomfort at night    Currently in Pain?  Yes    Pain Score  3     Pain Location  Neck    Pain Orientation  Right;Left    Pain Type  Chronic pain    Pain Radiating Towards  starts hips, low back up to shoulders    Pain Onset  More than a month ago    Pain Frequency  Intermittent    Aggravating Factors   sleep    Pain Relieving Factors  nutrition changes         [facility] PT Assessment - 03/02/19 0001      Assessment   Medical Diagnosis  cervical radicular pain     Referring Provider (PT)  Dr. Alain Marion    Onset Date/Surgical Date  --   last few months    Next MD Visit  follow up in 1 month     Prior Therapy  2 years ago;  DN sometimes it depended on who did it; went to [facility] mostly massage which helped      Precautions   Precautions  None      Restrictions   Weight Bearing Restrictions  No      Balance Screen   Has the patient fallen in the past 6 months  No    Has the patient had a decrease in activity level because of a fear of falling?   No    Is the patient reluctant to leave their home because of a fear of falling?   No      Home Film/video editor residence      Prior Function   Vocation  Full time employment    Biomedical engineer full time all remote ; tutoring     Leisure  relax do nothing       Observation/Other Assessments   Focus on Therapeutic Outcomes (FOTO)   41% limitation       Posture/Postural Control   Postural Limitations  Rounded Shoulders;Forward head      AROM   Overall AROM Comments  Full UE ROM except right internal rotation of shoulder limited    Cervical Flexion  50    Cervical Extension  53    Cervical - Right Side Bend  28    Cervical - Left Side Bend  25    Cervical - Right Rotation  40    Cervical - Left Rotation  34      Strength   Overall Strength Comments  Bil shoulder strength 4/5; periscapular strength 4-/5     Cervical Flexion  4-/5    Cervical Extension  4-/5      Palpation   Palpation comment  tender points and decreased muscle lengths suboccipitals, cervical and thoracic paraspinals, upper traps, periscapular muscles right > left                 Objective measurements completed on examination: See above findings.              PT Education - 03/02/19 1842    Education Details  Access Code: [email or web address] supine cervical retractions ; home TENs info    Person(s) Educated  Patient    Methods  Explanation;Demonstration;Handout    Comprehension  Returned demonstration;Verbalized understanding       PT Short Term Goals - 03/02/19 1900      PT SHORT TERM GOAL #1   Title  independent with initial HEP    Time  4    Period  Weeks    Status  New    Target Date  03/30/19      PT SHORT TERM GOAL #2   Title  The patient will report a 25% improvement in sleep  Time  4    Period  Weeks    Status  New      PT SHORT TERM GOAL #3   Title  Improved cervical rotation to 40 degrees and improved sidebending to 30 degrees bilaterally needed for driving    Time  4    Period  Weeks    Status  New        PT Long Term Goals - 03/02/19 1902      PT LONG TERM GOAL #1   Title  independent with HEP and understand how to progress herself     Time  8    Period  Weeks    Status  New    Target Date  04/27/19      PT LONG TERM GOAL #2   Title  The 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    Time  8    Period  Weeks    Status  New      PT LONG TERM GOAL #3   Title  The patient will have improved cervical flexion and extension to 55 degrees and rotation to 45 degrees 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  Recently, while having GI issues, she had an exacerbation of neck pain and a reduction of ROM.  She reports the pain affects her sleep. The patient reports pain begins in her hips and radiates up her spine to her neck.    Mild head forward and rounded shoulders.  Decreased cervical ROM in all planes particularly sidebending and rotation.  No directional preference identified.  Dizziness produced with nodding motion.  Muscular tender points and decreased muscle lengths in suboccipitals, upper traps, levators, rhomboids and paraspinals.  Cervical and periscapular strength grossly 4-/5.  She would benefit from PT to address these deficits.    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; OA; fibromyalgia; lack of  motivation    Examination-Activity Limitations  Lift;Sleep;Sit    Examination-Participation Restrictions  Driving;Community Activity;Other;School    Stability/Clinical Decision Making  Evolving/Moderate complexity    Clinical Decision Making  Moderate    Rehab Potential  Good    PT Frequency  2x / week    PT Duration  8 weeks    PT Treatment/Interventions  ADLs/Self Care Home Management;Cryotherapy;Electrical Stimulation;Ultrasound;Moist Heat;Traction;Therapeutic exercise;Therapeutic activities;Neuromuscular re-education;Manual techniques;Patient/family education;Dry needling;Taping    PT Next Visit Plan  assess response to cervical retractions and add to HEP; manual techniques especially soft  tissue work;  postural ex's;  ES/heat    PT Home Exercise Plan  Access Code: [email or web address]    Recommended Other Services  patient interested in home TENS    Consulted and Agree with Plan of Care  Patient       Patient will benefit from skilled therapeutic intervention in order to improve the following deficits and impairments:  Increased fascial restricitons, Decreased range of motion, Increased muscle spasms, Pain, Decreased strength, Postural dysfunction  Visit Diagnosis: Cervicalgia - Plan: PT plan of care cert/re-cert  Muscle weakness (generalized) - Plan: PT plan of care cert/re-cert  Other muscle spasm - Plan: PT plan of care cert/re-cert     Problem List Patient Active Problem List   Diagnosis Date Noted  . Throat pain 02/14/2019  . Constipation 12/26/2018  . Abdominal pain 12/26/2018  . Brachial neuritis of right upper extremity 07/06/2018  . Well adult exam 03/16/2018  . Obesity 03/16/2018  . Conjunctivitis 07/14/2017  . Foreign body of right ear 12/01/2016  . Sweats, menopausal 10/20/2016  . Panic attacks 10/20/2016  . Loss of transverse plantar arch of right foot 09/07/2016  . Other bursal cyst of wrist 08/11/2016  . Posterior right knee pain 07/16/2016  . Knee pain, right  05/21/2016  . Hamstring strain, sequela 05/21/2016  . Wart viral 05/12/2016  . Contusion of knee 05/05/2016  . Fall involving sidewalk curb 05/05/2016  . Aphasia 03/13/2016  . Shoulder pain, bilateral 12/11/2015  . Fibromyalgia 12/11/2015  . Positional vertigo 11/15/2015  . Post concussion syndrome 11/01/2015  . Clavicle fracture 10/30/2015  . Hair loss 10/11/2015  . Cervical radicular pain 09/20/2015  . Pain in joint, shoulder region 09/11/2015  . Right wrist pain 08/07/2015  . Splinter in skin 07/30/2015  . Fall from ladder 07/30/2015  . Skull fracture with concussion ([place]) 07/30/2015  . Fracture of cervical vertebra, C6 ([place]) 07/30/2015  . Thoracic vertebral fracture ([place]) 07/30/2015  . Closed fracture of base of fourth metacarpal bone of left hand 06/30/2015  . Laceration of hand, left 06/30/2015  . Abdominal pain, epigastric 05/07/2015  . Scaly patch rash 02/26/2015  . Mitral valve regurgitation 09/27/2014  . PAT (paroxysmal atrial tachycardia) ([place]) 09/06/2014  . Tachycardia 07/31/2014  . Sprain of ankle 06/29/2014  . Earache, left 05/15/2013  . Dizziness 05/15/2013  . Asthmatic bronchitis 05/08/2013  . Benign paroxysmal positional vertigo 05/08/2013  . Chest wall pain 01/26/2013  . LUQ abdominal pain 01/20/2013  . Insomnia 01/10/2013  . Depression (emotion) 01/10/2013  . URI, acute 08/25/2012  . Snoring 08/25/2012  . IBS (irritable bowel syndrome) 02/08/2012  . Multiple pulmonary nodules 02/08/2012  . Chest heaviness 11/16/2010  . GERD 05/24/2010  . SINUSITIS, MAXILLARY, CHRONIC 05/20/2010  . Myalgia and myositis 11/20/2009  . Fatigue 11/20/2009  . ARTHRALGIA 06/26/2009  . Cervicogenic headache 06/26/2009  . TOBACCO USE, QUIT 06/26/2009  . LOW BACK PAIN 02/20/2009  . Rosacea 12/27/2008  . PRURITUS 12/27/2008  . Allergic urticaria 12/27/2008  . B12 deficiency 01/02/2008   Ruben Im, PT 03/02/19 7:14 PM Phone: [phone] Fax: [phone] Alvera Singh 03/02/2019, 7:13 PM  [facility] [address], [place], [place], [address] Phone: [phone]   Fax:  [phone]  Name: Regina [name] MRN: [id number] Date of Birth: [birth year]

## 2019-03-03 ENCOUNTER — Ambulatory Visit: Payer: BC Managed Care – PPO | Admitting: Physical Therapy

## 2019-03-07 NOTE — Progress Notes (Signed)
   OPERATIVE REPORT Patient name: Regina [name] MRN: [id number] DOB: [birth year]  DOS:  03/07/19  Preop Dx: Exostosis right great toe lateral aspect Postop Dx: same  Procedure:  1.  Exostectomy right great toe lateral aspect  Surgeon: Edrick Kins DPM  Anesthesia: 50-50 mixture of 2% lidocaine plain with 0.5% Marcaine plain totaling 6 mL infiltrated in the patient's right great toe right lower extremity  Hemostasis: Ankle tourniquet inflated to a pressure of 250mmHg after esmarch exsanguination   EBL: Minimal mL Materials: None Injectables: None Pathology: None  Condition: The patient tolerated the procedure and anesthesia well. No complications noted or reported   Justification for procedure: The patient is a 53 y.o. female who presents today for surgical correction of a symptomatic exostosis noted to the lateral aspect of the right great toe. All conservative modalities of been unsuccessful in providing any sort of satisfactory alleviation of symptoms with the patient. The patient was told benefits as well as possible side effects of the surgery. The patient consented for surgical correction. The patient consent form was reviewed. All patient questions were answered. No guarantees were expressed or implied.   Procedure in Detail: The patient was brought to the procedure room, placed in the procedure table in the supine position at which time an aseptic scrub and drape were performed about the patient's respective lower extremity after anesthesia was induced as described above. Attention was then directed to the surgical area where procedure number one commenced.  Procedure #1: Exostectomy right great toe lateral aspect  A 1.0 cm linear longitudinal skin incision was planned and made overlying the dorsal lateral aspect of the right great toe.  Incision was carried down to the level of bone with care taken to cut clamp ligate and retract away all small neurovascular structures  traversing the incision site.  Sharp dissection was utilized to expose the underlying exostosis.  At this time bone cutter forceps were utilized to resect away the hypertrophic portions of the bone.  Hand rasp was utilized to smooth the bone down to a smooth anatomical contour.  The incision was then irrigated with normal saline in preparation for primary closure.  4-0 nylon suture was utilized to reapproximate superficial skin edges and for primary closure.  Dry sterile compressive dressings were then applied to all previously mentioned incision sites about the patient's lower extremity. The tourniquet which was used for hemostasis was deflated. All normal neurovascular responses including pink color and warmth returned all the digits of patient's lower extremity.  The patient was then transferred from the operating room to the recovery room having tolerated the procedure and anesthesia well. All vital signs are stable.  Patient was then discharged from the office.  Verbal as well as written instructions were provided for the patient regarding wound care. The patient is to keep the dressings clean dry and intact until they are to follow surgeon Dr. Daylene Katayama in the office upon discharge.   Edrick Kins, DPM [facility]  Dr. Edrick Kins, [place]                                        [place], [place] [address]                Office [phone]  Fax [phone]

## 2019-03-08 ENCOUNTER — Ambulatory Visit: Payer: BC Managed Care – PPO | Admitting: Physical Therapy

## 2019-03-08 ENCOUNTER — Other Ambulatory Visit: Payer: Self-pay

## 2019-03-08 ENCOUNTER — Encounter: Payer: Self-pay | Admitting: Podiatry

## 2019-03-08 ENCOUNTER — Encounter: Payer: Self-pay | Admitting: Physical Therapy

## 2019-03-08 ENCOUNTER — Ambulatory Visit (INDEPENDENT_AMBULATORY_CARE_PROVIDER_SITE_OTHER): Payer: BC Managed Care – PPO | Admitting: Podiatry

## 2019-03-08 ENCOUNTER — Ambulatory Visit (INDEPENDENT_AMBULATORY_CARE_PROVIDER_SITE_OTHER): Payer: BC Managed Care – PPO

## 2019-03-08 DIAGNOSIS — Z9889 Other specified postprocedural states: Secondary | ICD-10-CM

## 2019-03-08 DIAGNOSIS — M542 Cervicalgia: Secondary | ICD-10-CM | POA: Diagnosis not present

## 2019-03-08 DIAGNOSIS — M898X7 Other specified disorders of bone, ankle and foot: Secondary | ICD-10-CM | POA: Diagnosis not present

## 2019-03-08 DIAGNOSIS — E042 Nontoxic multinodular goiter: Secondary | ICD-10-CM | POA: Insufficient documentation

## 2019-03-08 DIAGNOSIS — R1312 Dysphagia, oropharyngeal phase: Secondary | ICD-10-CM | POA: Insufficient documentation

## 2019-03-08 DIAGNOSIS — M6281 Muscle weakness (generalized): Secondary | ICD-10-CM

## 2019-03-08 DIAGNOSIS — M62838 Other muscle spasm: Secondary | ICD-10-CM

## 2019-03-08 NOTE — Therapy (Signed)
[facility] [address] [place], [place], [address] Phone: [phone]   Fax:  [phone]  Physical Therapy Treatment  Patient Details  Name: Regina [name] MRN: [id number] Date of Birth: 16-Dec-1965 Referring Provider (PT): Dr. Posey Rea   Encounter Date: 03/08/2019  PT End of Session - 03/08/19 1359    Visit Number  2    Date for PT Re-Evaluation  04/27/19    Authorization Type  BCBS    PT Start Time  1359    PT Stop Time  1439    PT Time Calculation (min)  40 min    Activity Tolerance  Patient tolerated treatment well    Behavior During Therapy  [facility] for tasks assessed/performed       Past Medical History:  Diagnosis Date  . Allergic urticaria   . ARTHRALGIA   . Arthritis   . Atrial fibrillation ([facility])   . B12 DEFICIENCY   . CFS (chronic fatigue syndrome)   . Endometriosis   . Epidural hemorrhage with loss of consciousness ([facility]) 06/29/15  . Fibroid   . Fibromyalgia   . Fracture of cervical vertebra, C6 ([facility]) 06/29/15  . Gastritis   . GERD   . Heart murmur   . History of endometriosis   . IBS (irritable bowel syndrome)   . Internal hemorrhoids   . MVP (mitral valve prolapse)   . Osteoarthritis   . Rosacea   . Skin cancer of arm, right   . Thoracic compression fracture ([facility]) 06/29/15   T2-T6    Past Surgical History:  Procedure Laterality Date  . ABDOMINAL HYSTERECTOMY     partial  . ABDOMINAL HYSTERECTOMY    . ANAL FISSURE REPAIR     Dr Zachery Dakins 2009  . APPENDECTOMY    . COLPOSCOPY    . HEMORRHOID SURGERY     Dr. Zachery Dakins 2009  . OVARIAN CYST REMOVAL    . RT BUNIONECTOMT    . TONSILLECTOMY AND ADENOIDECTOMY    . TUBAL LIGATION    . UTERINE FIBROID SURGERY      There were no vitals filed for this visit.  Subjective Assessment - 03/08/19 1405    Subjective  No significant pain right now. I have to leave a little early for another appt.    Pertinent History  broke neck C6, T3-7 in 2017  no hardware;  fracture right shoulder;  dizziness;  memory issues:  states she does better not being overloaded with instructions/ HEP;  states she is not motivated to exercise;  [facility] at [facility] ([facility] to [facility]) and [facility] for mostly massage and limited ex    Currently in Pain?  Yes    Pain Score  1     Pain Location  Neck    Pain Orientation  Right;Left    Pain Descriptors / Indicators  Dull;Aching    Multiple Pain Sites  No                       [facility] Adult PT Treatment/Exercise - 03/08/19 0001      Manual Therapy   Soft tissue mobilization  cervical LT > RT: scalenes, SCM, occiput, TP release Bil SCM: PROM Bil rotation, Intermittent traction               PT Short Term Goals - 03/02/19 1900      PT SHORT TERM GOAL #1   Title  independent with initial HEP  Time  4    Period  Weeks    Status  New    Target Date  03/30/19      PT SHORT TERM GOAL #2   Title  The patient will report a 25% improvement in sleep    Time  4    Period  Weeks    Status  New      PT SHORT TERM GOAL #3   Title  Improved cervical rotation to 40 degrees and improved sidebending to 30 degrees bilaterally needed for driving    Time  4    Period  Weeks    Status  New        PT Long Term Goals - 03/02/19 1902      PT LONG TERM GOAL #1   Title  independent with HEP and understand how to progress herself    Time  8    Period  Weeks    Status  New    Target Date  04/27/19      PT LONG TERM GOAL #2   Title  The 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    Time  8    Period  Weeks    Status  New      PT LONG TERM GOAL #3   Title  The patient will have improved cervical flexion and extension to 55 degrees and rotation to 45 degrees for 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 PTA manually worked on cervical soft tissue Lt > RT. Performed trigger point release to the LT scalenes and SCM. Post session pt reported improved rotation ROM. Did not have time to exercise.    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; OA; fibromyalgia; lack of motivation    Examination-Activity Limitations  Lift;Sleep;Sit    Examination-Participation Restrictions  Driving;Community Activity;Other;School    Stability/Clinical Decision Making  Evolving/Moderate complexity    Rehab Potential  Good    PT Frequency  2x / week    PT Duration  8 weeks    PT Treatment/Interventions  ADLs/Self Care Home Management;Cryotherapy;Electrical Stimulation;Ultrasound;Moist Heat;Traction;Therapeutic exercise;Therapeutic activities;Neuromuscular re-education;Manual techniques;Patient/family education;Dry needling;Taping    PT Next Visit Plan  Add cervical retractions to HEP, did not get to today; see how pt repsonded after lengthy maual work today.    PT Home Exercise Plan  Access Code: [facility]    Consulted and Agree with Plan of Care  --  Patient will benefit from skilled therapeutic intervention in order to improve the following deficits and impairments:  Increased fascial restricitons, Decreased range of motion, Increased muscle spasms,  Pain, Decreased strength, Postural dysfunction  Visit Diagnosis: Cervicalgia  Muscle weakness (generalized)  Other muscle spasm     Problem List Patient Active Problem List   Diagnosis Date Noted  . Throat pain 02/14/2019  . Constipation 12/26/2018  . Abdominal pain 12/26/2018  . Brachial neuritis of right upper extremity 07/06/2018  . Well adult exam 03/16/2018  . Obesity 03/16/2018  . Conjunctivitis 07/14/2017  . Foreign body of right ear 12/01/2016  . Sweats, menopausal 10/20/2016  . Panic attacks 10/20/2016  . Loss of transverse plantar arch of right foot 09/07/2016  . Other bursal cyst of wrist 08/11/2016  . Posterior right knee pain 07/16/2016  . Knee pain, right 05/21/2016  . Hamstring strain, sequela 05/21/2016  . Wart viral 05/12/2016  . Contusion of knee 05/05/2016  . Fall involving sidewalk curb 05/05/2016  . Aphasia 03/13/2016  . Shoulder pain, bilateral 12/11/2015  . Fibromyalgia 12/11/2015  . Positional vertigo 11/15/2015  . Post concussion syndrome 11/01/2015  . Clavicle fracture 10/30/2015  . Hair loss 10/11/2015  . Cervical radicular pain 09/20/2015  . Pain in joint, shoulder region 09/11/2015  . Right wrist pain 08/07/2015  . Splinter in skin 07/30/2015  . Fall from ladder 07/30/2015  . Skull fracture with concussion ([facility]) 07/30/2015  . Fracture of cervical vertebra, C6 ([facility]) 07/30/2015  . Thoracic vertebral fracture ([facility]) 07/30/2015  . Closed fracture of base of fourth metacarpal bone of left hand 06/30/2015  . Laceration of hand, left 06/30/2015  . Abdominal pain, epigastric 05/07/2015  . Scaly patch rash 02/26/2015  . Mitral valve regurgitation 09/27/2014  . PAT (paroxysmal atrial tachycardia) ([facility]) 09/06/2014  . Tachycardia 07/31/2014  . Sprain of ankle 06/29/2014  . Earache, left 05/15/2013  . Dizziness 05/15/2013  . Asthmatic bronchitis 05/08/2013  . Benign paroxysmal positional vertigo 05/08/2013  . Chest wall pain 01/26/2013  .  LUQ abdominal pain 01/20/2013  . Insomnia 01/10/2013  . Depression (emotion) 01/10/2013  . URI, acute 08/25/2012  . Snoring 08/25/2012  . IBS (irritable bowel syndrome) 02/08/2012  . Multiple pulmonary nodules 02/08/2012  . Chest heaviness 11/16/2010  . GERD 05/24/2010  . SINUSITIS, MAXILLARY, CHRONIC 05/20/2010  . Myalgia and myositis 11/20/2009  . Fatigue 11/20/2009  . ARTHRALGIA 06/26/2009  . Cervicogenic headache 06/26/2009  . TOBACCO USE, QUIT 06/26/2009  . LOW BACK PAIN 02/20/2009  . Rosacea 12/27/2008  . PRURITUS 12/27/2008  . Allergic urticaria 12/27/2008  . B12 deficiency 01/02/2008    Merary Garguilo, PTA 03/08/2019, 2:43 PM  [facility] [address] [place], [place], [address] Phone: [phone]   Fax:  [phone]  Name: Regina [name] MRN: [id number] Date of Birth: 14-Aug-1965

## 2019-03-09 ENCOUNTER — Encounter: Payer: Self-pay | Admitting: Physical Therapy

## 2019-03-09 ENCOUNTER — Ambulatory Visit: Payer: BC Managed Care – PPO | Admitting: Physical Therapy

## 2019-03-09 DIAGNOSIS — M62838 Other muscle spasm: Secondary | ICD-10-CM

## 2019-03-09 DIAGNOSIS — M542 Cervicalgia: Secondary | ICD-10-CM | POA: Diagnosis not present

## 2019-03-09 DIAGNOSIS — M6281 Muscle weakness (generalized): Secondary | ICD-10-CM

## 2019-03-09 NOTE — Therapy (Signed)
[facility] [address] [place], [place], [address] Phone: [phone]   Fax:  [phone]  Physical Therapy Treatment  Patient Details  Name: Regina [name] MRN: [id number] Date of Birth: Apr 15, 1965 Referring Provider (PT): Dr. Alain Marion   Encounter Date: 03/09/2019  PT End of Session - 03/09/19 1743    Visit Number  3    Date for PT Re-Evaluation  04/27/19    Authorization Type  BCBS    PT Start Time  0732    PT Stop Time  0815    PT Time Calculation (min)  43 min    Activity Tolerance  Patient tolerated treatment well       Past Medical History:  Diagnosis Date  . Allergic urticaria   . ARTHRALGIA   . Arthritis   . Atrial fibrillation ([place])   . B12 DEFICIENCY   . CFS (chronic fatigue syndrome)   . Endometriosis   . Epidural hemorrhage with loss of consciousness ([place]) 06/29/15  . Fibroid   . Fibromyalgia   . Fracture of cervical vertebra, C6 ([place]) 06/29/15  . Gastritis   . GERD   . Heart murmur   . History of endometriosis   . IBS (irritable bowel syndrome)   . Internal hemorrhoids   . MVP (mitral valve prolapse)   . Osteoarthritis   . Rosacea   . Skin cancer of arm, right   . Thoracic compression fracture ([place]) 06/29/15   T2-T6    Past Surgical History:  Procedure Laterality Date  . ABDOMINAL HYSTERECTOMY     partial  . ABDOMINAL HYSTERECTOMY    . ANAL FISSURE REPAIR     Dr Rise [name] 2009  . APPENDECTOMY    . COLPOSCOPY    . HEMORRHOID SURGERY     Dr. Rise [name] 2009  . OVARIAN CYST REMOVAL    . RT BUNIONECTOMT    . TONSILLECTOMY AND ADENOIDECTOMY    . TUBAL LIGATION    . UTERINE FIBROID SURGERY      There were no vitals filed for this visit.  Subjective Assessment - 03/09/19 0734    Subjective  Things are about the same.  Pretty good.  I had a hard time getting up this morning.  Base of left shoulder blade is the worst part right now.    Pertinent History  broke neck C6, T3-7 in  2017 no hardware;  fracture right shoulder;  dizziness;  memory issues:  states she does better not being overloaded with instructions/ HEP;  states she is not motivated to exercise;  [facility] at [facility] ([facility]) and [facility] for mostly massage and limited ex    Currently in Pain?  Yes    Pain Score  2                        [facility] Adult PT Treatment/Exercise - 03/09/19 0001      Neck Exercises: Seated   Other Seated Exercise  thoracic extension with ball       Moist Heat Therapy   Number Minutes Moist Heat  12 Minutes    Moist Heat Location  Shoulder;Cervical      Electrical Stimulation   Electrical Stimulation Location  scapula     Electrical Stimulation Action  IFC    Electrical Stimulation Parameters  9 ma 12 min     Electrical Stimulation Goals  Pain      Manual Therapy  Soft tissue mobilization  Addaday instrument assisted soft tissue work rhomboids, lats, levator scap     Scapular Mobilization  medial and lateral glides and distraction grade 3/4      Neck Exercises: Stretches   Other Neck Stretches  door way reach up and over 5x 20 sec holds    Other Neck Stretches  doorway rhomboid/subscap stretch 3x 20 sec              PT Education - 03/09/19 0751    Education Details  Medbridge not working  doorway scap stretches; seated thoracic extension    Person(s) Educated  Patient    Methods  Explanation    Comprehension  Returned demonstration       PT Short Term Goals - 03/02/19 1900      PT SHORT TERM GOAL #1   Title  independent with initial HEP    Time  4    Period  Weeks    Status  New    Target Date  03/30/19      PT SHORT TERM GOAL #2   Title  The patient will report a 25% improvement in sleep    Time  4    Period  Weeks    Status  New      PT SHORT TERM GOAL #3   Title  Improved cervical rotation to 40 degrees and improved sidebending to 30 degrees bilaterally needed for driving    Time  4    Period   Weeks    Status  New        PT Long Term Goals - 03/02/19 1902      PT LONG TERM GOAL #1   Title  independent with HEP and understand how to progress herself    Time  8    Period  Weeks    Status  New    Target Date  04/27/19      PT LONG TERM GOAL #2   Title  The 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    Time  8    Period  Weeks    Status  New      PT LONG TERM GOAL #3   Title  The patient will have improved cervical flexion and extension to 55 degrees and rotation to 45 degrees 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  Significant myofascial restriction limiting scapular mobility.  Following manual therapy much improved scapular dissociation.  She is receptive to limited exercise instruction as requested on evaluation secondary to residual effects from head injury.  She may be a good candidate for dry needling but she is  still considering this.  Therapist closely monitoring response with all treatment interventions.  Good response to ES/heat.  She has ordered one for home but it has not arrived yet.    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; OA; fibromyalgia; lack of motivation    Rehab Potential  Good    PT Frequency  2x / week    PT Duration  8 weeks    PT Treatment/Interventions  ADLs/Self Care Home Management;Cryotherapy;Electrical  Stimulation;Ultrasound;Moist Heat;Traction;Therapeutic exercise;Therapeutic activities;Neuromuscular re-education;Manual techniques;Patient/family education;Dry needling;Taping    PT Next Visit Plan  Add cervical retractions, doorway stretches, seated thoracic ext with ball  to HEP, scapular mobility  and thoracic mobility ex;  manual therapy;  ES/heat as needed    PT Home Exercise Plan  Access Code: [facility]       Patient will benefit from skilled therapeutic intervention in order to improve the following deficits and impairments:  Increased fascial restricitons, Decreased range of motion, Increased muscle spasms, Pain, Decreased strength, Postural dysfunction  Visit Diagnosis: Cervicalgia  Muscle weakness (generalized)  Other muscle spasm     Problem List Patient Active Problem List   Diagnosis Date Noted  . Throat pain 02/14/2019  . Constipation 12/26/2018  . Abdominal pain 12/26/2018  . Brachial neuritis of right upper extremity 07/06/2018  . Well adult exam 03/16/2018  . Obesity 03/16/2018  . Conjunctivitis 07/14/2017  . Foreign body of right ear 12/01/2016  . Sweats, menopausal 10/20/2016  . Panic attacks 10/20/2016  . Loss of transverse plantar arch of right foot 09/07/2016  . Other bursal cyst of wrist 08/11/2016  . Posterior right knee pain 07/16/2016  . Knee pain, right 05/21/2016  . Hamstring strain, sequela 05/21/2016  . Wart viral 05/12/2016  . Contusion of knee 05/05/2016  . Fall involving sidewalk curb 05/05/2016  . Aphasia 03/13/2016  . Shoulder pain, bilateral 12/11/2015  . Fibromyalgia 12/11/2015  . Positional vertigo 11/15/2015  . Post concussion syndrome 11/01/2015  . Clavicle fracture 10/30/2015  . Hair loss 10/11/2015  . Cervical radicular pain 09/20/2015  . Pain in joint, shoulder region 09/11/2015  . Right wrist pain 08/07/2015  . Splinter in skin 07/30/2015  . Fall from ladder 07/30/2015  . Skull fracture with concussion ([place]) 07/30/2015  .  Fracture of cervical vertebra, C6 ([place]) 07/30/2015  . Thoracic vertebral fracture ([place]) 07/30/2015  . Closed fracture of base of fourth metacarpal bone of left hand 06/30/2015  . Laceration of hand, left 06/30/2015  . Abdominal pain, epigastric 05/07/2015  . Scaly patch rash 02/26/2015  . Mitral valve regurgitation 09/27/2014  . PAT (paroxysmal atrial tachycardia) ([place]) 09/06/2014  . Tachycardia 07/31/2014  . Sprain of ankle 06/29/2014  . Earache, left 05/15/2013  . Dizziness 05/15/2013  . Asthmatic bronchitis 05/08/2013  . Benign paroxysmal positional vertigo 05/08/2013  . Chest wall pain 01/26/2013  . LUQ abdominal pain 01/20/2013  . Insomnia 01/10/2013  . Depression (emotion) 01/10/2013  . URI, acute 08/25/2012  . Snoring 08/25/2012  . IBS (irritable bowel syndrome) 02/08/2012  . Multiple pulmonary nodules 02/08/2012  . Chest heaviness 11/16/2010  . GERD 05/24/2010  . SINUSITIS, MAXILLARY, CHRONIC 05/20/2010  . Myalgia and myositis 11/20/2009  . Fatigue 11/20/2009  . ARTHRALGIA 06/26/2009  . Cervicogenic headache 06/26/2009  . TOBACCO USE, QUIT 06/26/2009  . LOW BACK PAIN 02/20/2009  . Rosacea 12/27/2008  . PRURITUS 12/27/2008  . Allergic urticaria 12/27/2008  . B12 deficiency 01/02/2008   Ruben Im, PT 03/09/19 5:50  PM Phone: [phone] Fax: [phone] Alvera Singh 03/09/2019, 5:50 PM  [facility] [address] [place], [place], [address] Phone: [phone]   Fax:  [phone]  Name: Regina [name] MRN: [id number] Date of Birth: 05-01-1965

## 2019-03-10 ENCOUNTER — Other Ambulatory Visit: Payer: Self-pay | Admitting: Otolaryngology

## 2019-03-10 DIAGNOSIS — E042 Nontoxic multinodular goiter: Secondary | ICD-10-CM

## 2019-03-11 NOTE — Progress Notes (Signed)
   Subjective:  Patient presents today status post exostectomy right hallux. DOS: 03/01/2019. She states she is doing very well. She denies any pain or modifying factors. She has been using the post op shoe as directed. Patient is here for further evaluation and treatment.    Past Medical History:  Diagnosis Date  . Allergic urticaria   . ARTHRALGIA   . Arthritis   . Atrial fibrillation ([place])   . B12 DEFICIENCY   . CFS (chronic fatigue syndrome)   . Endometriosis   . Epidural hemorrhage with loss of consciousness ([place]) 06/29/15  . Fibroid   . Fibromyalgia   . Fracture of cervical vertebra, C6 ([place]) 06/29/15  . Gastritis   . GERD   . Heart murmur   . History of endometriosis   . IBS (irritable bowel syndrome)   . Internal hemorrhoids   . MVP (mitral valve prolapse)   . Osteoarthritis   . Rosacea   . Skin cancer of arm, right   . Thoracic compression fracture ([place]) 06/29/15   T2-T6      Objective/Physical Exam Neurovascular status intact.  Skin incisions appear to be well coapted with sutures and staples intact. No sign of infectious process noted. No dehiscence. No active bleeding noted. Moderate edema noted to the surgical extremity.  Radiographic Exam:  Orthopedic hardware and osteotomies sites appear to be stable with routine healing.  Assessment: 1. s/p exostectomy right hallux. DOS: 03/01/2019   Plan of Care:  1. Patient was evaluated. X-rays reviewed 2. Dressing changed.  3. Continue using post op shoe.  4. Return to clinic in one week for suture removal.    Edrick Kins, DPM [facility]  Dr. Edrick Kins, [place] [address] [place]                                        [place], [place] [address]                Office [phone]  Fax [phone]

## 2019-03-15 ENCOUNTER — Ambulatory Visit: Payer: BC Managed Care – PPO | Admitting: Physical Therapy

## 2019-03-15 ENCOUNTER — Other Ambulatory Visit: Payer: Self-pay

## 2019-03-15 ENCOUNTER — Ambulatory Visit (INDEPENDENT_AMBULATORY_CARE_PROVIDER_SITE_OTHER): Payer: BC Managed Care – PPO | Admitting: Podiatry

## 2019-03-15 ENCOUNTER — Ambulatory Visit (INDEPENDENT_AMBULATORY_CARE_PROVIDER_SITE_OTHER): Payer: BC Managed Care – PPO

## 2019-03-15 DIAGNOSIS — M898X7 Other specified disorders of bone, ankle and foot: Secondary | ICD-10-CM | POA: Diagnosis not present

## 2019-03-15 DIAGNOSIS — Z9889 Other specified postprocedural states: Secondary | ICD-10-CM

## 2019-03-17 ENCOUNTER — Encounter: Payer: Self-pay | Admitting: Physical Therapy

## 2019-03-17 ENCOUNTER — Ambulatory Visit: Payer: BC Managed Care – PPO | Admitting: Physical Therapy

## 2019-03-17 ENCOUNTER — Other Ambulatory Visit: Payer: Self-pay

## 2019-03-17 DIAGNOSIS — M6281 Muscle weakness (generalized): Secondary | ICD-10-CM

## 2019-03-17 DIAGNOSIS — M62838 Other muscle spasm: Secondary | ICD-10-CM

## 2019-03-17 DIAGNOSIS — M542 Cervicalgia: Secondary | ICD-10-CM | POA: Diagnosis not present

## 2019-03-17 NOTE — Patient Instructions (Signed)
  URL: [email or web address]  Date: 03/17/2019  Prepared by: Ruben Im   Exercises Supine Chin Tuck - 7 reps - 1 sets - 3 hold - 2x daily - 7x weekly Cervical Retraction with Resistance - 10 reps - 1 sets - 1x daily - 7x weekly Seated Assisted Cervical Rotation with Towel - 10 reps - 1 sets - 1x daily - 7x weekly

## 2019-03-17 NOTE — Therapy (Signed)
[facility] [address], [place], [place], [address] Phone: [phone]   Fax:  [phone]  Physical Therapy Treatment  Patient Details  Name: Regina [name] MRN: [id number] Date of Birth: 29-Mar-1966 Referring Provider (PT): Dr. Alain Marion   Encounter Date: 03/17/2019  PT End of Session - 03/17/19 1137    Visit Number  4    Date for PT Re-Evaluation  04/27/19    Authorization Type  BCBS    PT Start Time  1107    PT Stop Time  1145    PT Time Calculation (min)  38 min    Activity Tolerance  Patient tolerated treatment well       Past Medical History:  Diagnosis Date  . Allergic urticaria   . ARTHRALGIA   . Arthritis   . Atrial fibrillation ([place])   . B12 DEFICIENCY   . CFS (chronic fatigue syndrome)   . Endometriosis   . Epidural hemorrhage with loss of consciousness ([place]) 06/29/15  . Fibroid   . Fibromyalgia   . Fracture of cervical vertebra, C6 ([place]) 06/29/15  . Gastritis   . GERD   . Heart murmur   . History of endometriosis   . IBS (irritable bowel syndrome)   . Internal hemorrhoids   . MVP (mitral valve prolapse)   . Osteoarthritis   . Rosacea   . Skin cancer of arm, right   . Thoracic compression fracture ([place]) 06/29/15   T2-T6    Past Surgical History:  Procedure Laterality Date  . ABDOMINAL HYSTERECTOMY     partial  . ABDOMINAL HYSTERECTOMY    . ANAL FISSURE REPAIR     Dr Rise [name] 2009  . APPENDECTOMY    . COLPOSCOPY    . HEMORRHOID SURGERY     Dr. Rise [name] 2009  . OVARIAN CYST REMOVAL    . RT BUNIONECTOMT    . TONSILLECTOMY AND ADENOIDECTOMY    . TUBAL LIGATION    . UTERINE FIBROID SURGERY      There were no vitals filed for this visit.  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  Frontal headaches.  I've been very stiff.  I've been in a lot of pain this week.  Not interested in DN today.    Pertinent History  broke neck C6, T3-7 in 2017  no hardware;  fracture right shoulder;  dizziness;  memory issues:  states she does better not being overloaded with instructions/ HEP;  states she is not motivated to exercise;  [facility] at [facility] ([facility] to [facility]) and [facility] for mostly massage and limited ex    Currently in Pain?  Yes    Pain Score  3     Pain Location  Neck    Pain Orientation  Right;Left    Pain Type  Chronic pain                       [facility] Adult PT Treatment/Exercise - 03/17/19 0001      Neck Exercises: Seated   Neck Retraction  10 reps    Neck Retraction Limitations  towel around neck at C7 level giving counter pressure     Other Seated Exercise  thoracic extension with ball     Other Seated Exercise  attempted UE motions with ball behind back but discontinued secondary to clavicular popping/discomfort       Neck  Exercises: Supine   Capital Flexion  10 reps    Capital Flexion Limitations  on foam roll    Cervical Rotation  Right;Left;10 reps    Cervical Rotation Limitations  with foam roll behind head     Other Supine Exercise  lying on foam roll vertically: palms ups, UE elevation and UE "swim" motions 10x each     Other Supine Exercise  foam roll thoracic extension with head supported by hands 10x       Neck Exercises: Sidelying   Other Sidelying Exercise  open books 10x right/left       Moist Heat Therapy   Number Minutes Moist Heat  12 Minutes    Moist Heat Location  Shoulder;Cervical      Electrical Stimulation   Electrical Stimulation Location  scapula     Electrical Stimulation Action  IFC    Electrical Stimulation Parameters  7 ma 12 min     Electrical Stimulation Goals  Pain             PT Education - 03/17/19 1140    Education Details  Access Code: [email or web address]  retraction seated with towel;  rotation snag with towel    Person(s) Educated  Patient    Methods  Explanation;Demonstration;Handout    Comprehension  Returned demonstration;Verbalized  understanding       PT Short Term Goals - 03/02/19 1900      PT SHORT TERM GOAL #1   Title  independent with initial HEP    Time  4    Period  Weeks    Status  New    Target Date  03/30/19      PT SHORT TERM GOAL #2   Title  The patient will report a 25% improvement in sleep    Time  4    Period  Weeks    Status  New      PT SHORT TERM GOAL #3   Title  Improved cervical rotation to 40 degrees and improved sidebending to 30 degrees bilaterally needed for driving    Time  4    Period  Weeks    Status  New        PT Long Term Goals - 03/02/19 1902      PT LONG TERM GOAL #1   Title  independent with HEP and understand how to progress herself    Time  8    Period  Weeks    Status  New    Target Date  04/27/19      PT LONG TERM GOAL #2   Title  The 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    Time  8    Period  Weeks    Status  New      PT LONG TERM GOAL #3   Title  The patient will have improved cervical flexion and extension to 55 degrees and rotation to 45 degrees 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  She recognizes that stress compounds physical response and overall sensitization.  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  Modified ex's to discontinue repetitive UE movements secondary to clavicular region pain and popping.    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; OA; fibromyalgia; lack of motivation    Rehab Potential  Good    PT Frequency  2x / week    PT Duration  8 weeks    PT Treatment/Interventions  ADLs/Self Care Home Management;Cryotherapy;Electrical Stimulation;Ultrasound;Moist Heat;Traction;Therapeutic exercise;Therapeutic activities;Neuromuscular re-education;Manual techniques;Patient/family education;Dry needling;Taping    PT Next Visit Plan  cervical SNAGs; cervical retractions with towel overpressure;  scapular mobility  and thoracic mobility ex;  limit UE movements secondary to pain/popping;  manual therapy;  ES/heat as needed    PT Home Exercise Plan  Access Code: [facility]       Patient will benefit from skilled therapeutic intervention in order to improve the following deficits and impairments:  Increased fascial restricitons, Decreased range of motion, Increased muscle spasms, Pain, Decreased strength, Postural dysfunction  Visit Diagnosis: Cervicalgia  Muscle weakness (generalized)  Other muscle spasm     Problem List Patient Active Problem List   Diagnosis Date Noted  . Multiple thyroid nodules 03/08/2019  . Oropharyngeal dysphagia 03/08/2019  . Throat pain 02/14/2019  . Constipation 12/26/2018  . Abdominal pain 12/26/2018  . Brachial neuritis of right upper extremity 07/06/2018  . Well adult exam 03/16/2018  . Obesity 03/16/2018  . Conjunctivitis 07/14/2017  . Foreign body of right ear 12/01/2016  . Sweats, menopausal 10/20/2016  . Panic attacks 10/20/2016  . Loss of transverse plantar arch of right foot 09/07/2016  . Other bursal cyst of wrist 08/11/2016  . Posterior right knee pain 07/16/2016  . Knee pain, right 05/21/2016  . Hamstring strain, sequela 05/21/2016  . Wart viral 05/12/2016  . Contusion of knee 05/05/2016  . Fall involving sidewalk curb 05/05/2016  . Aphasia 03/13/2016  . Shoulder pain, bilateral 12/11/2015  . Fibromyalgia 12/11/2015  .  Positional vertigo 11/15/2015  . Post concussion syndrome 11/01/2015  . Clavicle fracture 10/30/2015  . Hair loss 10/11/2015  . Cervical radicular pain 09/20/2015  . Pain in joint, shoulder region 09/11/2015  . Right wrist pain 08/07/2015  . Splinter in skin 07/30/2015  . Fall from ladder 07/30/2015  . Skull fracture with concussion ([place]) 07/30/2015  . Fracture of cervical vertebra, C6 ([place]) 07/30/2015  . Thoracic vertebral fracture ([place]) 07/30/2015  . Closed fracture of base of fourth metacarpal bone of left hand 06/30/2015  . Laceration of hand, left 06/30/2015  . Abdominal pain, epigastric 05/07/2015  . Scaly patch rash 02/26/2015  . Mitral valve regurgitation 09/27/2014  . PAT (paroxysmal atrial tachycardia) ([place]) 09/06/2014  . Tachycardia 07/31/2014  . Sprain of ankle 06/29/2014  . Earache, left 05/15/2013  . Dizziness 05/15/2013  . Asthmatic bronchitis 05/08/2013  . Benign paroxysmal positional vertigo 05/08/2013  . Chest wall pain 01/26/2013  . LUQ abdominal pain 01/20/2013  . Insomnia 01/10/2013  . Depression (emotion) 01/10/2013  . URI, acute 08/25/2012  . Snoring 08/25/2012  .  IBS (irritable bowel syndrome) 02/08/2012  . Multiple pulmonary nodules 02/08/2012  . Chest heaviness 11/16/2010  . GERD 05/24/2010  . SINUSITIS, MAXILLARY, CHRONIC 05/20/2010  . Myalgia and myositis 11/20/2009  . Fatigue 11/20/2009  . ARTHRALGIA 06/26/2009  . Cervicogenic headache 06/26/2009  . TOBACCO USE, QUIT 06/26/2009  . LOW BACK PAIN 02/20/2009  . Rosacea 12/27/2008  . PRURITUS 12/27/2008  . Allergic urticaria 12/27/2008  . B12 deficiency 01/02/2008   Ruben Im, PT 03/17/19 11:56 AM Phone: [phone] Fax: [phone] Alvera Singh 03/17/2019, 11:55 AM  [facility] [address], [place], [place], [address] Phone: [phone]   Fax:  [phone]  Name: Regina [name] MRN: [id number] Date of Birth:  09/26/1965

## 2019-03-19 NOTE — Progress Notes (Signed)
   Subjective:  Patient presents today status post exostectomy right hallux. DOS: 03/01/2019. She reports a painful "knot" to the right sub-first MPJ that appeared after surgery. She has been using the post op shoe as directed. She denies any modifying factors. Patient is here for further evaluation and treatment.    Past Medical History:  Diagnosis Date  . Allergic urticaria   . ARTHRALGIA   . Arthritis   . Atrial fibrillation ([place])   . B12 DEFICIENCY   . CFS (chronic fatigue syndrome)   . Endometriosis   . Epidural hemorrhage with loss of consciousness ([place]) 06/29/15  . Fibroid   . Fibromyalgia   . Fracture of cervical vertebra, C6 ([place]) 06/29/15  . Gastritis   . GERD   . Heart murmur   . History of endometriosis   . IBS (irritable bowel syndrome)   . Internal hemorrhoids   . MVP (mitral valve prolapse)   . Osteoarthritis   . Rosacea   . Skin cancer of arm, right   . Thoracic compression fracture ([place]) 06/29/15   T2-T6      Objective/Physical Exam Neurovascular status intact.  Skin incisions appear to be well coapted with sutures and staples intact. No sign of infectious process noted. No dehiscence. No active bleeding noted. Moderate edema noted to the surgical extremity.  Radiographic Exam:  Orthopedic hardware and osteotomies sites appear to be stable with routine healing.  Assessment: 1. s/p exostectomy right hallux. DOS: 03/01/2019   Plan of Care:  1. Patient was evaluated. X-rays reviewed 2. Sutures removed.  3. Resume wearing good shoe gear.  4. Return to clinic in 4 weeks for final follow up visit.    Edrick Kins, DPM [facility]  Dr. Edrick Kins, [place]                                        [place], [place] [address]                Office [phone]  Fax [phone]

## 2019-03-20 ENCOUNTER — Ambulatory Visit: Payer: BC Managed Care – PPO | Admitting: Physical Therapy

## 2019-03-20 ENCOUNTER — Other Ambulatory Visit: Payer: Self-pay

## 2019-03-20 ENCOUNTER — Encounter: Payer: Self-pay | Admitting: Physical Therapy

## 2019-03-20 DIAGNOSIS — M542 Cervicalgia: Secondary | ICD-10-CM

## 2019-03-20 DIAGNOSIS — M6281 Muscle weakness (generalized): Secondary | ICD-10-CM

## 2019-03-20 DIAGNOSIS — M62838 Other muscle spasm: Secondary | ICD-10-CM

## 2019-03-20 NOTE — Therapy (Signed)
[facility] [address] [place], [place], [address] Phone: [phone]   Fax:  [phone]  Physical Therapy Treatment  Patient Details  Name: Regina [name] MRN: [id number] Date of Birth: 1966/03/30 Referring Provider (PT): Dr. Posey Rea   Encounter Date: 03/20/2019  PT End of Session - 03/20/19 0851    Visit Number  5    Date for PT Re-Evaluation  04/27/19    Authorization Type  BCBS    PT Start Time  0851    PT Stop Time  0950    PT Time Calculation (min)  59 min    Activity Tolerance  Patient tolerated treatment well    Behavior During Therapy  [facility] for tasks assessed/performed       Past Medical History:  Diagnosis Date  . Allergic urticaria   . ARTHRALGIA   . Arthritis   . Atrial fibrillation (HCC)   . B12 DEFICIENCY   . CFS (chronic fatigue syndrome)   . Endometriosis   . Epidural hemorrhage with loss of consciousness (HCC) 06/29/15  . Fibroid   . Fibromyalgia   . Fracture of cervical vertebra, C6 (HCC) 06/29/15  . Gastritis   . GERD   . Heart murmur   . History of endometriosis   . IBS (irritable bowel syndrome)   . Internal hemorrhoids   . MVP (mitral valve prolapse)   . Osteoarthritis   . Rosacea   . Skin cancer of arm, right   . Thoracic compression fracture (HCC) 06/29/15   T2-T6    Past Surgical History:  Procedure Laterality Date  . ABDOMINAL HYSTERECTOMY     partial  . ABDOMINAL HYSTERECTOMY    . ANAL FISSURE REPAIR     Dr Zachery Dakins 2009  . APPENDECTOMY    . COLPOSCOPY    . HEMORRHOID SURGERY     Dr. Zachery Dakins 2009  . OVARIAN CYST REMOVAL    . RT BUNIONECTOMT    . TONSILLECTOMY AND ADENOIDECTOMY    . TUBAL LIGATION    . UTERINE FIBROID SURGERY      There were no vitals filed for this visit.  Subjective Assessment - 03/20/19 0854    Subjective  I plan on getting a TENS unti for home but have not yet. I did not sleep well last night. I have tingling in my hands/arms since  returning to therapy. The area of tingling does change, it is not a constant place.    Pertinent History  broke neck C6, T3-7 in 2017 no hardware;  fracture right shoulder;  dizziness;  memory issues:  states she does better not being overloaded with instructions/ HEP;  states she is not motivated to exercise;  [facility] ([facility]) and [facility] for mostly massage and limited ex    Limitations  Other (comment)    How long can you sit comfortably?  as needed    Diagnostic tests  CT of abdomen;  some other scan noted OA in spine    Patient Stated Goals  increase ROM;  less discomfort at night    Currently in Pain?  Yes    Pain Score  3     Pain Location  Neck    Pain Orientation  Right;Left    Pain Descriptors / Indicators  Dull;Aching    Aggravating Factors   my eating has been a factor lately has not been good    Pain Relieving Factors  when I eat  better, TENS                       [facility] Adult PT Treatment/Exercise - 03/20/19 0001      Neck Exercises: Supine   Capital Flexion  10 reps    Capital Flexion Limitations  on foam roll    Cervical Rotation  Right;Left;10 reps    Cervical Rotation Limitations  with foam roll behind head    circles at end range   Other Supine Exercise  lying on foam roll vertically: palms ups, UE elevation and UE "swim" motions 10x each       Neck Exercises: Sidelying   Other Sidelying Exercise  open books 10x right/left       Electrical Stimulation   Electrical Stimulation Action  IFC    Electrical Stimulation Goals  Pain      Manual Therapy   Soft tissue mobilization  cervical LT > RT: scalenes, SCM, occiput, TP release Bil SCM: PROM Bil rotation, Intermittent traction             PT Education - 03/20/19 1000    Education Details  We discussed trying to avoid cervical flexion and staying there for long periods of time during the day ( work Optometrist). We discussed getting a document  clip for reading and setting up her workstation in a manner that would be more favorable for neutral cervical spine.    Person(s) Educated  Patient    Methods  Explanation;Demonstration    Comprehension  Verbalized understanding       PT Short Term Goals - 03/02/19 1900      PT SHORT TERM GOAL #1   Title  independent with initial HEP    Time  4    Period  Weeks    Status  New    Target Date  03/30/19      PT SHORT TERM GOAL #2   Title  The patient will report a 25% improvement in sleep    Time  4    Period  Weeks    Status  New      PT SHORT TERM GOAL #3   Title  Improved cervical rotation to 40 degrees and improved sidebending to 30 degrees bilaterally needed for driving    Time  4    Period  Weeks    Status  New        PT Long Term Goals - 03/02/19 1902      PT LONG TERM GOAL #1   Title  independent with HEP and understand how to progress herself    Time  8    Period  Weeks    Status  New    Target Date  04/27/19      PT LONG TERM GOAL #2   Title  The 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    Time  8    Period  Weeks    Status  New      PT LONG TERM GOAL #3   Title  The patient will have improved cervical flexion and extension to 55 degrees and rotation to 45 degrees 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 She is having an MRI next week on her neck due to pain and tingling under her chin. Pt tolerated the exercises  fair...she had some vertigo issues with the roll that made it difficult for pt to relax. Soft tissues much improved since second visit, only 1 noticable trigger point along LT SCM.    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; OA; fibromyalgia; lack of motivation    Examination-Activity Limitations  Lift;Sleep;Sit    Examination-Participation Restrictions  Driving;Community Activity;Other;School    Stability/Clinical Decision Making  Evolving/Moderate complexity    Rehab Potential  Good    PT Frequency  2x / week    PT Duration  8 weeks    PT Treatment/Interventions  ADLs/Self Care Home Management;Cryotherapy;Electrical Stimulation;Ultrasound;Moist Heat;Traction;Therapeutic exercise;Therapeutic activities;Neuromuscular re-education;Manual techniques;Patient/family education;Dry needling;Taping    PT Next Visit Plan  cervical SNAGs; cervical retractions with towel overpressure;  scapular mobility  and thoracic mobility ex;  limit UE movements secondary to pain/popping;  manual therapy;  ES/heat as needed    PT Home Exercise Plan  Access Code: [email or web address]    Consulted and Agree with Plan of Care  Patient       Patient will benefit from skilled therapeutic intervention in order to improve the following deficits and impairments:  Increased fascial restricitons, Decreased range of motion, Increased muscle spasms, Pain, Decreased strength, Postural dysfunction  Visit Diagnosis: Cervicalgia  Muscle weakness (generalized)  Other muscle spasm     Problem List Patient Active Problem List   Diagnosis Date Noted  . Multiple thyroid nodules 03/08/2019  . Oropharyngeal dysphagia 03/08/2019  . Throat pain 02/14/2019  . Constipation 12/26/2018  . Abdominal pain 12/26/2018  .  Brachial neuritis of right upper extremity 07/06/2018  . Well adult exam 03/16/2018  . Obesity 03/16/2018  . Conjunctivitis 07/14/2017  . Foreign body of right ear 12/01/2016  . Sweats, menopausal 10/20/2016  . Panic attacks 10/20/2016  . Loss of transverse plantar arch of right foot 09/07/2016  . Other bursal cyst of wrist 08/11/2016  . Posterior right knee pain 07/16/2016  . Knee pain, right 05/21/2016  . Hamstring strain, sequela 05/21/2016  . Wart viral 05/12/2016  . Contusion of knee 05/05/2016  . Fall involving sidewalk curb 05/05/2016  . Aphasia 03/13/2016  . Shoulder pain, bilateral 12/11/2015  . Fibromyalgia 12/11/2015  . Positional vertigo 11/15/2015  . Post concussion syndrome 11/01/2015  . Clavicle fracture 10/30/2015  . Hair loss 10/11/2015  . Cervical radicular pain 09/20/2015  . Pain in joint, shoulder region 09/11/2015  . Right wrist pain 08/07/2015  . Splinter in skin 07/30/2015  . Fall from ladder 07/30/2015  . Skull fracture with concussion (HCC) 07/30/2015  . Fracture of cervical vertebra, C6 (HCC) 07/30/2015  . Thoracic vertebral fracture (HCC) 07/30/2015  . Closed fracture of base of fourth  metacarpal bone of left hand 06/30/2015  . Laceration of hand, left 06/30/2015  . Abdominal pain, epigastric 05/07/2015  . Scaly patch rash 02/26/2015  . Mitral valve regurgitation 09/27/2014  . PAT (paroxysmal atrial tachycardia) (HCC) 09/06/2014  . Tachycardia 07/31/2014  . Sprain of ankle 06/29/2014  . Earache, left 05/15/2013  . Dizziness 05/15/2013  . Asthmatic bronchitis 05/08/2013  . Benign paroxysmal positional vertigo 05/08/2013  . Chest wall pain 01/26/2013  . LUQ abdominal pain 01/20/2013  . Insomnia 01/10/2013  . Depression (emotion) 01/10/2013  . URI, acute 08/25/2012  . Snoring 08/25/2012  . IBS (irritable bowel syndrome) 02/08/2012  . Multiple pulmonary nodules 02/08/2012  . Chest heaviness 11/16/2010  . GERD 05/24/2010  . SINUSITIS,  MAXILLARY, CHRONIC 05/20/2010  . Myalgia and myositis 11/20/2009  . Fatigue 11/20/2009  . ARTHRALGIA 06/26/2009  . Cervicogenic headache 06/26/2009  . TOBACCO USE, QUIT 06/26/2009  . LOW BACK PAIN 02/20/2009  . Rosacea 12/27/2008  . PRURITUS 12/27/2008  . Allergic urticaria 12/27/2008  . B12 deficiency 01/02/2008    Yago Ludvigsen, PTA 03/20/2019, 10:02 AM  [facility] [address], [address] [place], [place], [address] Phone: [phone]   Fax:  [phone]  Name: RAINE BLOYER MRN: [id number] Date of Birth: 1965/05/15

## 2019-03-21 ENCOUNTER — Other Ambulatory Visit: Payer: Self-pay

## 2019-03-21 ENCOUNTER — Ambulatory Visit: Payer: BC Managed Care – PPO | Admitting: Physical Therapy

## 2019-03-21 DIAGNOSIS — M6281 Muscle weakness (generalized): Secondary | ICD-10-CM

## 2019-03-21 DIAGNOSIS — M542 Cervicalgia: Secondary | ICD-10-CM

## 2019-03-21 DIAGNOSIS — M62838 Other muscle spasm: Secondary | ICD-10-CM

## 2019-03-21 NOTE — Therapy (Signed)
[facility] [address], [place], [place], [address] Phone: [phone]   Fax:  [phone]  Physical Therapy Treatment  Patient Details  Name: Regina [name] MRN: [id number] Date of Birth: 05/30/1965 Referring Provider (PT): Dr. Alain Marion   Encounter Date: 03/21/2019  PT End of Session - 03/21/19 1156    Visit Number  6    Date for PT Re-Evaluation  04/27/19    Authorization Type  BCBS    PT Start Time  0800    PT Stop Time  0851    PT Time Calculation (min)  51 min    Activity Tolerance  Patient tolerated treatment well       Past Medical History:  Diagnosis Date  . Allergic urticaria   . ARTHRALGIA   . Arthritis   . Atrial fibrillation ([place])   . B12 DEFICIENCY   . CFS (chronic fatigue syndrome)   . Endometriosis   . Epidural hemorrhage with loss of consciousness ([place]) 06/29/15  . Fibroid   . Fibromyalgia   . Fracture of cervical vertebra, C6 ([place]) 06/29/15  . Gastritis   . GERD   . Heart murmur   . History of endometriosis   . IBS (irritable bowel syndrome)   . Internal hemorrhoids   . MVP (mitral valve prolapse)   . Osteoarthritis   . Rosacea   . Skin cancer of arm, right   . Thoracic compression fracture ([place]) 06/29/15   T2-T6    Past Surgical History:  Procedure Laterality Date  . ABDOMINAL HYSTERECTOMY     partial  . ABDOMINAL HYSTERECTOMY    . ANAL FISSURE REPAIR     Dr Rise [name] 2009  . APPENDECTOMY    . COLPOSCOPY    . HEMORRHOID SURGERY     Dr. Rise [name] 2009  . OVARIAN CYST REMOVAL    . RT BUNIONECTOMT    . TONSILLECTOMY AND ADENOIDECTOMY    . TUBAL LIGATION    . UTERINE FIBROID SURGERY      There were no vitals filed for this visit.  Subjective Assessment - 03/21/19 0802    Subjective  Left shoulder tight/sore.  It needs work.  Left upper trap region.  No tingling right at the moment,  It radiates down both arms intermittently since the accident.  I still don't want to do  dry needling.    Pertinent History  broke neck C6, T3-7 in 2017 no hardware;  fracture right shoulder;  dizziness;  memory issues:  states she does better not being overloaded with instructions/ HEP;  states she is not motivated to exercise;  [facility] ([facility]) and [facility] for mostly massage and limited ex    Currently in Pain?  Yes    Pain Score  4     Pain Location  Neck    Pain Type  Chronic pain                       [facility] Adult PT Treatment/Exercise - 03/21/19 0001      Moist Heat Therapy   Number Minutes Moist Heat  10 Minutes    Moist Heat Location  Shoulder;Cervical      Electrical Stimulation   Electrical Stimulation Location  left scapula and neck     Electrical Stimulation Action  IFC     Electrical Stimulation Goals  Pain      Manual Therapy   Manual therapy comments  discontinued Addaday to levator region secondary to complaints of it bothering her ear    Soft tissue mobilization/myofascial release   trigger point release left levator,rhomboid,subscap in supine and prone     Scapular Mobilization  medial and lateral glides and distraction grade 3/4 in sidelying       Neck Exercises: Stretches   Upper Trapezius Stretch  Right;Left;3 reps   with lacrosse ball against wall on trigger point   Levator Stretch  Left;3 reps   standing agaisnt wall with lacrosse ball on trigger point   Other Neck Stretches  shoulder distraction and adduction concurrently with lacrosse ball on trigger point                PT Short Term Goals - 03/02/19 1900      PT SHORT TERM GOAL #1   Title  independent with initial HEP    Time  4    Period  Weeks    Status  New    Target Date  03/30/19      PT SHORT TERM GOAL #2   Title  The patient will report a 25% improvement in sleep    Time  4    Period  Weeks    Status  New      PT SHORT TERM GOAL #3   Title  Improved cervical rotation to 40 degrees and improved sidebending  to 30 degrees bilaterally needed for driving    Time  4    Period  Weeks    Status  New        PT Long Term Goals - 03/02/19 1902      PT LONG TERM GOAL #1   Title  independent with HEP and understand how to progress herself    Time  8    Period  Weeks    Status  New    Target Date  04/27/19      PT LONG TERM GOAL #2   Title  The 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    Time  8    Period  Weeks    Status  New      PT LONG TERM GOAL #3   Title  The patient will have improved cervical flexion and extension to 55 degrees and rotation to 45 degrees 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  She has several left sided tender points in levator scap and rhomboid muscles which releases following extensive manual therapy.   Expect slower progress overall secondary to a complex history including spinal fractures, brain injury, vertigo and history of fibromyalgia.  She has multiple myofascial issues which benefit from  manual therapy.  She is not interested in dry needling at this time.    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; OA; fibromyalgia; lack of motivation    Examination-Participation Restrictions  Driving;Community Activity;Other;School    Rehab Potential  Good    PT Frequency  2x / week    PT Duration  8 weeks    PT Treatment/Interventions  ADLs/Self Care Home  Management;Cryotherapy;Electrical Stimulation;Ultrasound;Moist Heat;Traction;Therapeutic exercise;Therapeutic activities;Neuromuscular re-education;Manual techniques;Patient/family education;Dry needling;Taping    PT Next Visit Plan  recheck ROM;  manual therapy;  cervical SNAGs; cervical retractions with towel overpressure;  scapular mobility  and thoracic mobility ex;  limit UE movements secondary to pain/popping;  manual therapy;  ES/heat as needed    PT Home Exercise Plan  Access Code: [email or web address]       Patient will benefit from skilled therapeutic intervention in order to improve the following deficits and impairments:  Increased fascial restricitons, Decreased range of motion, Increased muscle spasms, Pain, Decreased strength, Postural dysfunction  Visit Diagnosis: Cervicalgia  Muscle weakness (generalized)  Other muscle spasm     Problem List Patient Active Problem List   Diagnosis Date Noted  . Multiple thyroid nodules 03/08/2019  . Oropharyngeal dysphagia 03/08/2019  . Throat pain 02/14/2019  . Constipation 12/26/2018  . Abdominal pain 12/26/2018  . Brachial neuritis of right upper extremity 07/06/2018  . Well adult exam 03/16/2018  . Obesity 03/16/2018  . Conjunctivitis 07/14/2017  . Foreign body of right ear 12/01/2016  . Sweats, menopausal 10/20/2016  . Panic attacks 10/20/2016  . Loss of transverse plantar arch of right foot 09/07/2016  . Other bursal cyst of wrist 08/11/2016  . Posterior right knee pain 07/16/2016  . Knee pain, right 05/21/2016  . Hamstring strain, sequela 05/21/2016  . Wart viral 05/12/2016  . Contusion of knee 05/05/2016  . Fall involving sidewalk curb 05/05/2016  . Aphasia 03/13/2016  . Shoulder pain, bilateral 12/11/2015  . Fibromyalgia 12/11/2015  . Positional vertigo 11/15/2015  . Post concussion syndrome 11/01/2015  . Clavicle fracture 10/30/2015  . Hair loss 10/11/2015  . Cervical radicular pain 09/20/2015  . Pain in joint, shoulder  region 09/11/2015  . Right wrist pain 08/07/2015  . Splinter in skin 07/30/2015  . Fall from ladder 07/30/2015  . Skull fracture with concussion ([place]) 07/30/2015  . Fracture of cervical vertebra, C6 ([place]) 07/30/2015  . Thoracic vertebral fracture ([place]) 07/30/2015  . Closed fracture of base of fourth metacarpal bone of left hand 06/30/2015  . Laceration of hand, left 06/30/2015  . Abdominal pain, epigastric 05/07/2015  . Scaly patch rash 02/26/2015  . Mitral valve regurgitation 09/27/2014  . PAT (paroxysmal atrial tachycardia) ([place]) 09/06/2014  . Tachycardia 07/31/2014  . Sprain of ankle 06/29/2014  . Earache, left 05/15/2013  . Dizziness 05/15/2013  . Asthmatic bronchitis 05/08/2013  . Benign paroxysmal positional vertigo 05/08/2013  . Chest wall pain 01/26/2013  . LUQ abdominal pain 01/20/2013  . Insomnia 01/10/2013  . Depression (emotion) 01/10/2013  . URI, acute 08/25/2012  . Snoring 08/25/2012  . IBS (irritable bowel syndrome) 02/08/2012  . Multiple pulmonary nodules 02/08/2012  . Chest heaviness 11/16/2010  . GERD 05/24/2010  . SINUSITIS, MAXILLARY, CHRONIC 05/20/2010  . Myalgia and myositis 11/20/2009  . Fatigue 11/20/2009  . ARTHRALGIA 06/26/2009  . Cervicogenic headache 06/26/2009  . TOBACCO USE, QUIT 06/26/2009  . LOW BACK PAIN 02/20/2009  . Rosacea 12/27/2008  . PRURITUS 12/27/2008  . Allergic urticaria 12/27/2008  .  B12 deficiency 01/02/2008   Ruben Im, PT 03/21/19 12:06 PM Phone: [phone] Fax: [phone] Alvera Singh 03/21/2019, 12:05 PM  [facility] [address] [place], [place], [address] Phone: [phone]   Fax:  [phone]  Name: Regina [name] MRN: [id number] Date of Birth: Sep 03, 1965

## 2019-03-27 ENCOUNTER — Other Ambulatory Visit: Payer: Self-pay

## 2019-03-27 ENCOUNTER — Ambulatory Visit: Payer: BC Managed Care – PPO | Admitting: Physical Therapy

## 2019-03-27 ENCOUNTER — Encounter: Payer: Self-pay | Admitting: Physical Therapy

## 2019-03-27 DIAGNOSIS — M6281 Muscle weakness (generalized): Secondary | ICD-10-CM

## 2019-03-27 DIAGNOSIS — M62838 Other muscle spasm: Secondary | ICD-10-CM

## 2019-03-27 DIAGNOSIS — M542 Cervicalgia: Secondary | ICD-10-CM | POA: Diagnosis not present

## 2019-03-27 NOTE — Therapy (Signed)
[facility] [address], [address] [place], [place], [address] Phone: [phone]   Fax:  [phone]  Physical Therapy Treatment  Patient Details  Name: Regina [name] MRN: [id number] Date of Birth: [birth year] Referring Provider (PT): Dr. Posey Rea   Encounter Date: 03/27/2019  PT End of Session - 03/27/19 1013    Visit Number  7    Date for PT Re-Evaluation  04/27/19    Authorization Type  BCBS    PT Start Time  0935    PT Stop Time  1020    PT Time Calculation (min)  45 min    Activity Tolerance  Patient tolerated treatment well    Behavior During Therapy  [facility] for tasks assessed/performed       Past Medical History:  Diagnosis Date  . Allergic urticaria   . ARTHRALGIA   . Arthritis   . Atrial fibrillation (HCC)   . B12 DEFICIENCY   . CFS (chronic fatigue syndrome)   . Endometriosis   . Epidural hemorrhage with loss of consciousness (HCC) 06/29/15  . Fibroid   . Fibromyalgia   . Fracture of cervical vertebra, C6 (HCC) 06/29/15  . Gastritis   . GERD   . Heart murmur   . History of endometriosis   . IBS (irritable bowel syndrome)   . Internal hemorrhoids   . MVP (mitral valve prolapse)   . Osteoarthritis   . Rosacea   . Skin cancer of arm, right   . Thoracic compression fracture (HCC) 06/29/15   T2-T6    Past Surgical History:  Procedure Laterality Date  . ABDOMINAL HYSTERECTOMY     partial  . ABDOMINAL HYSTERECTOMY    . ANAL FISSURE REPAIR     Dr Zachery Dakins 2009  . APPENDECTOMY    . COLPOSCOPY    . HEMORRHOID SURGERY     Dr. Zachery Dakins 2009  . OVARIAN CYST REMOVAL    . RT BUNIONECTOMT    . TONSILLECTOMY AND ADENOIDECTOMY    . TUBAL LIGATION    . UTERINE FIBROID SURGERY      There were no vitals filed for this visit.  Subjective Assessment - 03/27/19 1015    Subjective  My neck is really feeling better, looser. After last session I had a few consecutiive good days. The "next day is typically my  detox day." My Rt hip bothers me more lately.    Pertinent History  broke neck C6, T3-7 in 2017 no hardware;  fracture right shoulder;  dizziness;  memory issues:  states she does better not being overloaded with instructions/ HEP;  states she is not motivated to exercise;  [facility] ([facility]) and [facility] for mostly massage and limited ex    Currently in Pain?  Yes    Pain Score  4     Pain Location  Hip    Pain Orientation  Right    Pain Descriptors / Indicators  Sore    Aggravating Factors   when I don't eat well    Pain Relieving Factors  Soft issue work has really helped, TENS,    Multiple Pain Sites  No                       [facility] Adult PT Treatment/Exercise - 03/27/19 0001      Neck Exercises: Supine   Other Supine Exercise  Sholder press 3 sec hold 2x5: VC to feel her "collarbones  widen gently" and to keep chin down so she does not compress her neck.       Moist Heat Therapy   Number Minutes Moist Heat  --   During Energy manager Stimulation Location  Cervical    Electrical Stimulation Action  IFC    Electrical Stimulation Goals  Pain      Manual Therapy   Soft tissue mobilization  cervical LT > RT: scalenes, SCM, occiput, TP release Bil SCM: PROM Bil rotation, Intermittent traction               PT Short Term Goals - 03/02/19 1900      PT SHORT TERM GOAL #1   Title  independent with initial HEP    Time  4    Period  Weeks    Status  New    Target Date  03/30/19      PT SHORT TERM GOAL #2   Title  The patient will report a 25% improvement in sleep    Time  4    Period  Weeks    Status  New      PT SHORT TERM GOAL #3   Title  Improved cervical rotation to 40 degrees and improved sidebending to 30 degrees bilaterally needed for driving    Time  4    Period  Weeks    Status  New        PT Long Term Goals - 03/02/19 1902      PT LONG TERM GOAL #1   Title   independent with HEP and understand how to progress herself    Time  8    Period  Weeks    Status  New    Target Date  04/27/19      PT LONG TERM GOAL #2   Title  The 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    Time  8    Period  Weeks    Status  New      PT LONG TERM GOAL #3   Title  The patient will have improved cervical flexion and extension to 55 degrees and rotation to 45 degrees for 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 Cervical muscles have tremendously soften with manual work over the last few visits, pt even verbally remarked about this today.Pt intends on speaking with PT about adding Rt hip pain to her program.    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; OA; fibromyalgia; lack of motivation    Examination-Activity Limitations  Lift;Sleep;Sit    Stability/Clinical Decision Making  Evolving/Moderate complexity    Rehab Potential  Good    PT Frequency  2x / week    PT Duration  8 weeks    PT Treatment/Interventions  ADLs/Self Care Home Management;Cryotherapy;Electrical Stimulation;Ultrasound;Moist Heat;Traction;Therapeutic exercise;Therapeutic  activities;Neuromuscular re-education;Manual techniques;Patient/family education;Dry needling;Taping    PT Next Visit Plan  Since neck is starting to feel better pt is wondering if Pt can look at her Rt hip and consider asking MD permission to add this body part.    PT Home Exercise Plan  Access Code: [email or web address]    Consulted and Agree with Plan of Care  Patient       Patient will benefit from skilled therapeutic intervention in order to improve the following deficits and impairments:  Increased fascial restricitons, Decreased range of motion, Increased muscle spasms, Pain, Decreased strength, Postural dysfunction  Visit Diagnosis: Cervicalgia  Muscle weakness (generalized)  Other muscle spasm     Problem List Patient Active Problem List   Diagnosis Date Noted  . Multiple thyroid nodules 03/08/2019  . Oropharyngeal dysphagia 03/08/2019  . Throat pain 02/14/2019  . Constipation 12/26/2018  . Abdominal pain 12/26/2018  . Brachial neuritis of right upper extremity 07/06/2018  . Well adult exam 03/16/2018  . Obesity 03/16/2018  . Conjunctivitis 07/14/2017  . Foreign body of right ear 12/01/2016  . Sweats, menopausal 10/20/2016  . Panic attacks 10/20/2016  . Loss of transverse plantar arch of right foot 09/07/2016  . Other bursal cyst of wrist 08/11/2016  . Posterior right knee pain 07/16/2016  . Knee pain, right 05/21/2016  . Hamstring strain, sequela 05/21/2016  . Wart viral 05/12/2016  . Contusion of knee 05/05/2016  . Fall involving sidewalk curb 05/05/2016  . Aphasia 03/13/2016  . Shoulder pain, bilateral 12/11/2015  . Fibromyalgia 12/11/2015  . Positional vertigo 11/15/2015  . Post concussion syndrome 11/01/2015  . Clavicle fracture 10/30/2015  . Hair loss 10/11/2015  . Cervical radicular pain 09/20/2015  . Pain in joint, shoulder region 09/11/2015  . Right wrist pain 08/07/2015  . Splinter in skin 07/30/2015  . Fall from ladder 07/30/2015  . Skull fracture with  concussion (HCC) 07/30/2015  . Fracture of cervical vertebra, C6 (HCC) 07/30/2015  . Thoracic vertebral fracture (HCC) 07/30/2015  . Closed fracture of base of fourth metacarpal bone of left hand 06/30/2015  . Laceration of hand, left 06/30/2015  . Abdominal pain, epigastric 05/07/2015  . Scaly patch rash 02/26/2015  . Mitral valve regurgitation 09/27/2014  . PAT (paroxysmal atrial tachycardia) (HCC) 09/06/2014  . Tachycardia 07/31/2014  . Sprain of ankle 06/29/2014  . Earache, left 05/15/2013  . Dizziness 05/15/2013  . Asthmatic bronchitis 05/08/2013  . Benign paroxysmal positional vertigo 05/08/2013  . Chest wall pain 01/26/2013  . LUQ abdominal pain 01/20/2013  . Insomnia 01/10/2013  . Depression (emotion) 01/10/2013  . URI, acute 08/25/2012  . Snoring 08/25/2012  . IBS (irritable bowel syndrome) 02/08/2012  . Multiple pulmonary nodules 02/08/2012  . Chest heaviness 11/16/2010  . GERD 05/24/2010  . SINUSITIS, MAXILLARY, CHRONIC 05/20/2010  . Myalgia and myositis 11/20/2009  . Fatigue 11/20/2009  . ARTHRALGIA 06/26/2009  . Cervicogenic headache 06/26/2009  . TOBACCO USE, QUIT 06/26/2009  . LOW BACK PAIN 02/20/2009  . Rosacea 12/27/2008  . PRURITUS 12/27/2008  . Allergic urticaria 12/27/2008  . B12 deficiency 01/02/2008    Ellar Hakala, PTA  03/27/2019, 1:29 PM  [facility] [address] [place], [place], [address] Phone: [phone]   Fax:  [phone]  Name: Regina [name] MRN: [id number] Date of Birth: 08/10/1965

## 2019-03-28 ENCOUNTER — Encounter: Payer: Self-pay | Admitting: Physical Therapy

## 2019-03-28 ENCOUNTER — Other Ambulatory Visit: Payer: Self-pay | Admitting: Internal Medicine

## 2019-03-28 ENCOUNTER — Ambulatory Visit: Payer: BC Managed Care – PPO | Admitting: Physical Therapy

## 2019-03-28 DIAGNOSIS — M542 Cervicalgia: Secondary | ICD-10-CM | POA: Diagnosis not present

## 2019-03-28 DIAGNOSIS — M6281 Muscle weakness (generalized): Secondary | ICD-10-CM

## 2019-03-28 DIAGNOSIS — M62838 Other muscle spasm: Secondary | ICD-10-CM

## 2019-03-28 NOTE — Therapy (Signed)
[facility] [address] [place], [place], [address] Phone: [phone]   Fax:  [phone]  Physical Therapy Treatment  Patient Details  Name: Regina [name] MRN: [id number] Date of Birth: 02-Aug-1965 Referring Provider (PT): Dr. Alain Marion   Encounter Date: 03/28/2019  PT End of Session - 03/28/19 1750    Visit Number  8    Date for PT Re-Evaluation  04/27/19    Authorization Type  BCBS    PT Start Time  0810   pt late   PT Stop Time  0855    PT Time Calculation (min)  45 min    Activity Tolerance  Patient limited by pain       Past Medical History:  Diagnosis Date  . Allergic urticaria   . ARTHRALGIA   . Arthritis   . Atrial fibrillation ([place])   . B12 DEFICIENCY   . CFS (chronic fatigue syndrome)   . Endometriosis   . Epidural hemorrhage with loss of consciousness ([place]) 06/29/15  . Fibroid   . Fibromyalgia   . Fracture of cervical vertebra, C6 ([place]) 06/29/15  . Gastritis   . GERD   . Heart murmur   . History of endometriosis   . IBS (irritable bowel syndrome)   . Internal hemorrhoids   . MVP (mitral valve prolapse)   . Osteoarthritis   . Rosacea   . Skin cancer of arm, right   . Thoracic compression fracture ([place]) 06/29/15   T2-T6    Past Surgical History:  Procedure Laterality Date  . ABDOMINAL HYSTERECTOMY     partial  . ABDOMINAL HYSTERECTOMY    . ANAL FISSURE REPAIR     Dr Rise [name] 2009  . APPENDECTOMY    . COLPOSCOPY    . HEMORRHOID SURGERY     Dr. Rise [name] 2009  . OVARIAN CYST REMOVAL    . RT BUNIONECTOMT    . TONSILLECTOMY AND ADENOIDECTOMY    . TUBAL LIGATION    . UTERINE FIBROID SURGERY      There were no vitals filed for this visit.  Subjective Assessment - 03/28/19 0810    Subjective  My hip pain/back has worsened recently.  Neck feels really good since she worked on it yesterday.  Today mid to lower back pain.    Pertinent History  broke neck C6, T3-7 in 2017 no  hardware;  fracture right shoulder;  dizziness;  memory issues:  states she does better not being overloaded with instructions/ HEP;  states she is not motivated to exercise;  [facility] at [facility] ([facility]) and [facility] for mostly massage and limited ex    Currently in Pain?  Yes    Pain Score  4     Pain Location  Back    Pain Orientation  Mid    Pain Type  Chronic pain         [facility] PT Assessment - 03/28/19 0001      AROM   Cervical Flexion  48    Cervical Extension  60    Cervical - Right Side Bend  38    Cervical - Left Side Bend  45    Cervical - Right Rotation  50    Cervical - Left Rotation  33                   [facility] Adult PT Treatment/Exercise - 03/28/19 0001      Lumbar Exercises: Quadruped  [place]cat/Old Horse  10 reps    Straight Leg Raise  10 reps    Other Quadruped Lumbar Exercises  thread the needle to open books 10x     Other Quadruped Lumbar Exercises  childs pose 3x      Shoulder Exercises: Standing   Extension  Strengthening;Both;15 reps;Theraband    Theraband Level (Shoulder Extension)  Level 2 (Red)    Row  Strengthening;Both;15 reps;Theraband    Theraband Level (Shoulder Row)  Level 2 (Red)      Careers adviser Action  IFC     Electrical Stimulation Parameters  supine 7 ma 12 min     Electrical Stimulation Goals  Pain      Manual Therapy   Joint Mobilization  seated thoracic extension mobs with movement 2x 10 with PA pressure; attempted supine thoracic mob but painful on shoulders; sidelying thoracic neutral gapping 10x/ right/left       Neck Exercises: Stretches   Lower Cervical/Upper Thoracic Stretch Limitations  seated with UEs on table 3x     Other Neck Stretches  childs pose 3x                PT Short Term Goals - 03/28/19 7017      PT SHORT TERM GOAL #1   Title  independent with initial HEP     Baseline  "I'm never going to do my home exercises b/c I lack motivation."    Status  Not Met      PT SHORT TERM GOAL #2   Title  The patient will report a 25% improvement in sleep    Baseline  10% better    Time  4    Period  Weeks    Status  On-going      PT SHORT TERM GOAL #3   Title  Improved cervical rotation to 40 degrees and improved sidebending to 30 degrees bilaterally needed for driving    Status  Achieved        PT Long Term Goals - 03/02/19 1902      PT LONG TERM GOAL #1   Title  independent with HEP and understand how to progress herself    Time  8    Period  Weeks    Status  New    Target Date  04/27/19      PT LONG TERM GOAL #2   Title  The 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    Time  8    Period  Weeks    Status  New      PT LONG TERM GOAL #3   Title  The patient will have improved cervical flexion and extension to 55 degrees and rotation to 45 degrees 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  Cervical ROM has improved in the majority of movement planes.  She has new onset lower back/hip pain and understands she will need a PT referral from the doctor before this area can be evaluated and treated.  Treatment focus on mid thoracic region today at her request with limited joint and soft tissue mobility noted in this area.  She is willing to perform strengthening and low level mobility ex's in the clinic but states, "you  know I'm not doing this at home."  She reports that since her head injury 3 years ago, she lacks motivation to exercise or perform household chores.  This lack of motivation for exercise will  slow her progress with meeting goals and affect her long term prognosis for improvement.    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; OA; fibromyalgia; lack of motivation    Examination-Participation Restrictions  Driving;Community Activity;Other;School    Rehab Potential  Good    PT Frequency  2x / week    PT Duration  8 weeks    PT Treatment/Interventions  ADLs/Self Care Home Management;Cryotherapy;Electrical Stimulation;Ultrasound;Moist Heat;Traction;Therapeutic exercise;Therapeutic activities;Neuromuscular re-education;Manual techniques;Patient/family education;Dry needling;Taping    PT Next Visit Plan  cervical, thoracic and scapular mobility and strengthening ex;  manual therapy; Es/heat as needed;  will await MD referral for hip/low back    PT Home Exercise Plan  Access Code: [facility]       Patient will benefit from skilled therapeutic intervention in order to improve the following deficits and impairments:  Increased fascial restricitons, Decreased range of motion, Increased muscle spasms, Pain, Decreased strength, Postural dysfunction  Visit Diagnosis: Cervicalgia  Muscle weakness (generalized)  Other muscle spasm     Problem List Patient Active Problem List   Diagnosis Date Noted  . Multiple thyroid nodules 03/08/2019  . Oropharyngeal dysphagia 03/08/2019  . Throat pain 02/14/2019  . Constipation 12/26/2018  . Abdominal pain 12/26/2018  . Brachial neuritis of right upper extremity 07/06/2018  . Well adult exam 03/16/2018  . Obesity 03/16/2018  . Conjunctivitis 07/14/2017  . Foreign body of right ear 12/01/2016  . Sweats, menopausal 10/20/2016  . Panic attacks 10/20/2016  . Loss of transverse plantar arch of right foot  09/07/2016  . Other bursal cyst of wrist 08/11/2016  . Posterior right knee pain 07/16/2016  . Knee pain, right 05/21/2016  . Hamstring strain, sequela 05/21/2016  . Wart viral 05/12/2016  . Contusion of knee 05/05/2016  . Fall involving sidewalk curb 05/05/2016  . Aphasia 03/13/2016  . Shoulder pain, bilateral 12/11/2015  . Fibromyalgia 12/11/2015  . Positional vertigo 11/15/2015  . Post concussion syndrome 11/01/2015  . Clavicle fracture 10/30/2015  . Hair loss 10/11/2015  . Cervical radicular pain 09/20/2015  . Pain in joint, shoulder region 09/11/2015  . Right wrist pain 08/07/2015  . Splinter in skin 07/30/2015  . Fall from ladder 07/30/2015  . Skull fracture with concussion ([place]) 07/30/2015  . Fracture of cervical vertebra, C6 ([place]) 07/30/2015  . Thoracic vertebral fracture ([place]) 07/30/2015  . Closed fracture of base of fourth metacarpal bone of left hand 06/30/2015  . Laceration of hand, left 06/30/2015  . Abdominal pain, epigastric 05/07/2015  . Scaly patch rash 02/26/2015  . Mitral valve regurgitation 09/27/2014  . PAT (paroxysmal atrial tachycardia) ([place]) 09/06/2014  .  Tachycardia 07/31/2014  . Sprain of ankle 06/29/2014  . Earache, left 05/15/2013  . Dizziness 05/15/2013  . Asthmatic bronchitis 05/08/2013  . Benign paroxysmal positional vertigo 05/08/2013  . Chest wall pain 01/26/2013  . LUQ abdominal pain 01/20/2013  . Insomnia 01/10/2013  . Depression (emotion) 01/10/2013  . URI, acute 08/25/2012  . Snoring 08/25/2012  . IBS (irritable bowel syndrome) 02/08/2012  . Multiple pulmonary nodules 02/08/2012  . Chest heaviness 11/16/2010  . GERD 05/24/2010  . SINUSITIS, MAXILLARY, CHRONIC 05/20/2010  . Myalgia and myositis 11/20/2009  . Fatigue 11/20/2009  . ARTHRALGIA 06/26/2009  . Cervicogenic headache 06/26/2009  . TOBACCO USE, QUIT 06/26/2009  . LOW BACK PAIN 02/20/2009  . Rosacea 12/27/2008  . PRURITUS 12/27/2008  . Allergic urticaria 12/27/2008  .  B12 deficiency 01/02/2008   Ruben Im, PT 03/28/19 5:59 PM Phone: [phone] Fax: [phone] Alvera Singh 03/28/2019, 5:58 PM  [facility] [address] [place], [place], [address] Phone: [phone]   Fax:  [phone]  Name: Regina [name] MRN: [id number] Date of Birth: [birth year]

## 2019-03-30 ENCOUNTER — Ambulatory Visit
Admission: RE | Admit: 2019-03-30 | Discharge: 2019-03-30 | Disposition: A | Discharge status: Home / Self Care | Payer: BC Managed Care – PPO | Source: Ambulatory Visit | Attending: Otolaryngology | Admitting: Otolaryngology

## 2019-03-30 DIAGNOSIS — E042 Nontoxic multinodular goiter: Secondary | ICD-10-CM

## 2019-04-05 ENCOUNTER — Encounter: Payer: Self-pay | Admitting: Physical Therapy

## 2019-04-05 ENCOUNTER — Ambulatory Visit: Payer: BC Managed Care – PPO | Attending: Internal Medicine | Admitting: Physical Therapy

## 2019-04-05 ENCOUNTER — Other Ambulatory Visit: Payer: Self-pay

## 2019-04-05 DIAGNOSIS — M6281 Muscle weakness (generalized): Secondary | ICD-10-CM | POA: Diagnosis present

## 2019-04-05 DIAGNOSIS — M62838 Other muscle spasm: Secondary | ICD-10-CM | POA: Diagnosis present

## 2019-04-05 DIAGNOSIS — M542 Cervicalgia: Secondary | ICD-10-CM

## 2019-04-05 NOTE — Therapy (Signed)
[facility] [address], [address] [place], [place], [address] Phone: [phone]   Fax:  [phone]  Physical Therapy Treatment  Patient Details  Name: Regina [name] MRN: [id number] Date of Birth: Sep 23, 1965 Referring Provider (PT): Dr. Posey Rea   Encounter Date: 04/05/2019  PT End of Session - 04/05/19 1105    Visit Number  9    Date for PT Re-Evaluation  04/27/19    Authorization Type  BCBS    PT Start Time  1105    PT Stop Time  1205    PT Time Calculation (min)  60 min    Activity Tolerance  Patient tolerated treatment well    Behavior During Therapy  [facility] for tasks assessed/performed       Past Medical History:  Diagnosis Date  . Allergic urticaria   . ARTHRALGIA   . Arthritis   . Atrial fibrillation (HCC)   . B12 DEFICIENCY   . CFS (chronic fatigue syndrome)   . Endometriosis   . Epidural hemorrhage with loss of consciousness (HCC) 06/29/15  . Fibroid   . Fibromyalgia   . Fracture of cervical vertebra, C6 (HCC) 06/29/15  . Gastritis   . GERD   . Heart murmur   . History of endometriosis   . IBS (irritable bowel syndrome)   . Internal hemorrhoids   . MVP (mitral valve prolapse)   . Osteoarthritis   . Rosacea   . Skin cancer of arm, right   . Thoracic compression fracture (HCC) 06/29/15   T2-T6    Past Surgical History:  Procedure Laterality Date  . ABDOMINAL HYSTERECTOMY     partial  . ABDOMINAL HYSTERECTOMY    . ANAL FISSURE REPAIR     Dr Zachery Dakins 2009  . APPENDECTOMY    . COLPOSCOPY    . HEMORRHOID SURGERY     Dr. Zachery Dakins 2009  . OVARIAN CYST REMOVAL    . RT BUNIONECTOMT    . TONSILLECTOMY AND ADENOIDECTOMY    . TUBAL LIGATION    . UTERINE FIBROID SURGERY      There were no vitals filed for this visit.  Subjective Assessment - 04/05/19 1106    Subjective  I will not be able to request an order for my hip next week, work is a lot right now with exams.    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  --   Not really having "pain" this AM. I feel my normal today.                      [facility] Adult PT Treatment/Exercise - 04/05/19 0001      Shoulder Exercises: Standing   Extension  Strengthening;Both;15 reps;Theraband    Theraband Level (Shoulder Extension)  Level 2 (Red)   Added to [employer]  Strengthening;Both;15 reps;Theraband    Theraband Level (Shoulder Row)  Level 2 (Red)   added to HEP     Moist Heat Therapy   Number Minutes Moist Heat  --   To neck & back during Customer service manager Location  Cervical/thoracic  bil     Electrical Stimulation Action  IFC    Electrical Stimulation Parameters  To tolerance    Electrical Stimulation Goals  Pain      Manual Therapy   Soft tissue mobilization  cervical LT <  RT: scalenes, SCM, occiput, TP release Bil SCM: PROM Bil rotation, Intermittent traction               PT Short Term Goals - 03/28/19 0839      PT SHORT TERM GOAL #1   Title  independent with initial HEP    Baseline  "I'm never going to do my home exercises b/c I lack motivation."    Status  Not Met      PT SHORT TERM GOAL #2   Title  The patient will report a 25% improvement in sleep    Baseline  10% better    Time  4    Period  Weeks    Status  On-going      PT SHORT TERM GOAL #3   Title  Improved cervical rotation to 40 degrees and improved sidebending to 30 degrees bilaterally needed for driving    Status  Achieved        PT Long Term Goals - 03/02/19 1902      PT LONG TERM GOAL #1   Title  independent with HEP and understand how to progress herself    Time  8    Period   Weeks    Status  New    Target Date  04/27/19      PT LONG TERM GOAL #2   Title  The 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    Time  8    Period  Weeks    Status  New      PT LONG TERM GOAL #3   Title  The patient will have improved cervical flexion and extension to 55 degrees and rotation to 45 degrees for greater mobility for driving and work Education officer, environmental)    Time  8    Period  Weeks    Status  New      PT LONG TERM GOAL #4   Title  increased cervical and periscapular strength to grossly 4/5 needed for maintaining upright posture and peforming home and work ADLS    Time  8    Period  Weeks    Status  New      PT LONG TERM GOAL #5   Title  FOTO score </= 33% limitation    Time  8    Period  Weeks    Status  New            Plan - 04/05/19 1108    Clinical Impression Statement  Pt is very satisfied with the results she has gotten with her neck pain. She agreed that the band exercises are something she could/would do at home to help with her postural endurance, she feels value with those exercises. She did not find any value with the other stretches since she will not duplicate them at home nor did she find they made her feel any different. She has not pursued the order for her back/hip since work has been very busy ( exam time) but she did express next week she will most likely get that done. Soft tissues continue to soften, deep into upper traps bil was tender and thick. Pt repsonds well to manual therapy.  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; OA; fibromyalgia; lack of motivation    Examination-Activity Limitations  Lift;Sleep;Sit    Examination-Participation Restrictions  Driving;Community  Activity;Other;School    Stability/Clinical Decision Making  Evolving/Moderate complexity    Rehab Potential  Good    PT Frequency  2x / week    PT Duration  8 weeks    PT Treatment/Interventions  ADLs/Self Care Home Management;Cryotherapy;Electrical Stimulation;Ultrasound;Moist Heat;Traction;Therapeutic exercise;Therapeutic activities;Neuromuscular re-education;Manual techniques;Patient/family education;Dry needling;Taping    PT Next Visit Plan  cervical, thoracic and scapular mobility and strengthening ex;  manual therapy; Es/heat as needed;  will await MD referral for hip/low back    PT Home Exercise Plan  Access Code: [facility]    Consulted and Agree with Plan of Care  Patient       Patient will benefit from skilled therapeutic intervention in order to improve the following deficits and impairments:  Increased fascial restricitons, Decreased range of motion, Increased muscle spasms, Pain, Decreased strength, Postural dysfunction  Visit Diagnosis: Cervicalgia  Muscle weakness (generalized)  Other muscle spasm     Problem List Patient Active Problem List   Diagnosis Date Noted  . Multiple thyroid nodules 03/08/2019  . Oropharyngeal dysphagia 03/08/2019  . Throat pain 02/14/2019  . Constipation 12/26/2018  . Abdominal pain 12/26/2018  . Brachial neuritis of right upper extremity 07/06/2018  . Well adult exam 03/16/2018  . Obesity 03/16/2018  . Conjunctivitis 07/14/2017  . Foreign body of right ear 12/01/2016  . Sweats, menopausal 10/20/2016  . Panic attacks 10/20/2016  . Loss of transverse plantar arch of right foot 09/07/2016  . Other bursal cyst of wrist 08/11/2016  . Posterior right knee pain 07/16/2016  . Knee pain, right 05/21/2016  . Hamstring strain, sequela 05/21/2016  . Wart viral 05/12/2016  . Contusion of knee 05/05/2016  . Fall involving sidewalk curb 05/05/2016  . Aphasia 03/13/2016  . Shoulder pain, bilateral 12/11/2015  . Fibromyalgia 12/11/2015  .  Positional vertigo 11/15/2015  . Post concussion syndrome 11/01/2015  . Clavicle fracture 10/30/2015  . Hair loss 10/11/2015  . Cervical radicular pain 09/20/2015  . Pain in joint, shoulder region 09/11/2015  . Right wrist pain 08/07/2015  . Splinter in skin 07/30/2015  . Fall from ladder 07/30/2015  . Skull fracture with concussion (HCC) 07/30/2015  . Fracture of cervical vertebra, C6 (HCC) 07/30/2015  . Thoracic vertebral fracture (HCC) 07/30/2015  . Closed fracture of base of fourth metacarpal bone of left hand 06/30/2015  . Laceration of hand, left 06/30/2015  . Abdominal pain, epigastric 05/07/2015  . Scaly patch rash 02/26/2015  . Mitral valve regurgitation 09/27/2014  . PAT (paroxysmal atrial tachycardia) (HCC) 09/06/2014  . Tachycardia 07/31/2014  . Sprain of ankle 06/29/2014  . Earache, left 05/15/2013  . Dizziness 05/15/2013  . Asthmatic bronchitis 05/08/2013  . Benign paroxysmal positional vertigo 05/08/2013  . Chest wall pain 01/26/2013  . LUQ abdominal pain 01/20/2013  . Insomnia 01/10/2013  . Depression (emotion) 01/10/2013  . URI, acute 08/25/2012  . Snoring 08/25/2012  . IBS (irritable bowel syndrome) 02/08/2012  . Multiple pulmonary nodules 02/08/2012  . Chest heaviness 11/16/2010  . GERD 05/24/2010  . SINUSITIS, MAXILLARY, CHRONIC 05/20/2010  . Myalgia and myositis 11/20/2009  . Fatigue 11/20/2009  . ARTHRALGIA 06/26/2009  . Cervicogenic headache 06/26/2009  . TOBACCO USE, QUIT 06/26/2009  . LOW BACK PAIN  02/20/2009  . Rosacea 12/27/2008  . PRURITUS 12/27/2008  . Allergic urticaria 12/27/2008  . B12 deficiency 01/02/2008    Haevyn Ury, PTA 04/05/2019, 12:10 PM  [facility] [address], [address] [place], [place], [address] Phone: [phone]   Fax:  [phone]  Name: Regina [name] MRN: [id number] Date of Birth: [birth year]

## 2019-04-07 ENCOUNTER — Other Ambulatory Visit: Payer: Self-pay

## 2019-04-07 ENCOUNTER — Ambulatory Visit: Payer: BC Managed Care – PPO | Admitting: Physical Therapy

## 2019-04-07 ENCOUNTER — Encounter: Payer: Self-pay | Admitting: Physical Therapy

## 2019-04-07 DIAGNOSIS — M542 Cervicalgia: Secondary | ICD-10-CM

## 2019-04-07 DIAGNOSIS — M6281 Muscle weakness (generalized): Secondary | ICD-10-CM

## 2019-04-07 DIAGNOSIS — M62838 Other muscle spasm: Secondary | ICD-10-CM

## 2019-04-07 NOTE — Therapy (Signed)
PRURITUS 12/27/2008  . Allergic urticaria 12/27/2008  . B12 deficiency 01/02/2008    Regina [name], PTA 04/07/2019, 8:55 AM  [facility] [address] [place], [place], [address] Phone: [phone]   Fax:  [phone]  Name: Regina [name] MRN: [id number] Date of Birth: 11-11-1965  PRURITUS 12/27/2008  . Allergic urticaria 12/27/2008  . B12 deficiency 01/02/2008    Regina [name], PTA 04/07/2019, 8:55 AM  [facility] [address] [place], [place], [address] Phone: [phone]   Fax:  [phone]  Name: Regina [name] MRN: [id number] Date of Birth: 11-11-1965  [facility] [address], [place], [place], [address] Phone: [phone]   Fax:  [phone]  Physical Therapy Treatment  Patient Details  Name: Regina [name] MRN: [id number] Date of Birth: [birth year] Referring Provider (PT): Dr. Alain Marion   Encounter Date: 04/07/2019  PT End of Session - 04/07/19 0805    Visit Number  10    Date for PT Re-Evaluation  04/27/19    Authorization Type  BCBS    PT Start Time  0803    PT Stop Time  0845    PT Time Calculation (min)  42 min    Activity Tolerance  Patient tolerated treatment well    Behavior During Therapy  [facility] for tasks assessed/performed       Past Medical History:  Diagnosis Date  . Allergic urticaria   . ARTHRALGIA   . Arthritis   . Atrial fibrillation ([place])   . B12 DEFICIENCY   . CFS (chronic fatigue syndrome)   . Endometriosis   . Epidural hemorrhage with loss of consciousness ([place]) 06/29/15  . Fibroid   . Fibromyalgia   . Fracture of cervical vertebra, C6 ([place]) 06/29/15  . Gastritis   . GERD   . Heart murmur   . History of endometriosis   . IBS (irritable bowel syndrome)   . Internal hemorrhoids   . MVP (mitral valve prolapse)   . Osteoarthritis   . Rosacea   . Skin cancer of arm, right   . Thoracic compression fracture ([place]) 06/29/15   T2-T6    Past Surgical History:  Procedure Laterality Date  . ABDOMINAL HYSTERECTOMY     partial  . ABDOMINAL HYSTERECTOMY    . ANAL FISSURE REPAIR     Dr Rise [name] 2009  . APPENDECTOMY    . COLPOSCOPY    . HEMORRHOID SURGERY     Dr. Rise [name] 2009  . OVARIAN CYST REMOVAL    . RT BUNIONECTOMT    . TONSILLECTOMY AND ADENOIDECTOMY    . TUBAL LIGATION    . UTERINE FIBROID SURGERY      There were no vitals filed for this visit.  Subjective Assessment - 04/07/19 0807    Subjective  My neck felt good after last session. I did not mind doing the band exercises. Denies current pain.    Pertinent History  broke  neck C6, T3-7 in 2017 no hardware;  fracture right shoulder;  dizziness;  memory issues:  states she does better not being overloaded with instructions/ HEP;  states she is not motivated to exercise;  [facility] ([facility]) and [facility] for mostly massage and limited ex    Currently in Pain?  No/denies    Multiple Pain Sites  No         [facility] PT Assessment - 04/07/19 0001      AROM   Cervical - Left Rotation  40                   [facility] Adult PT Treatment/Exercise - 04/07/19 0001      Shoulder Exercises: Standing   Extension  Strengthening;Both;15 reps;Theraband    Theraband Level (Shoulder Extension)  Level 3 (Green)    Extension Limitations  Does unilaterally    Row  Strengthening;Both;15 reps;Theraband    Theraband Level (Shoulder Row)  Level 3 (Green)      Moist Heat Therapy   Moist Heat Location  --   During manual to upper  PRURITUS 12/27/2008  . Allergic urticaria 12/27/2008  . B12 deficiency 01/02/2008    Regina [name], PTA 04/07/2019, 8:55 AM  [facility] [address] [place], [place], [address] Phone: [phone]   Fax:  [phone]  Name: Regina [name] MRN: [id number] Date of Birth: 11-11-1965

## 2019-04-12 ENCOUNTER — Encounter: Payer: BC Managed Care – PPO | Admitting: Podiatry

## 2019-04-12 ENCOUNTER — Telehealth: Payer: Self-pay | Admitting: Physical Therapy

## 2019-04-12 ENCOUNTER — Ambulatory Visit: Payer: BC Managed Care – PPO | Admitting: Physical Therapy

## 2019-04-12 NOTE — Telephone Encounter (Signed)
PTA called pt for missed appt. Pt's voice mailbox is full so no message could be left.  Myrene Galas, PTA @TODAY @ 8:24 AM

## 2019-04-14 ENCOUNTER — Encounter: Payer: BC Managed Care – PPO | Admitting: Physical Therapy

## 2019-04-14 ENCOUNTER — Other Ambulatory Visit: Payer: BC Managed Care – PPO

## 2019-04-19 ENCOUNTER — Other Ambulatory Visit: Payer: Self-pay

## 2019-04-19 ENCOUNTER — Ambulatory Visit: Payer: BC Managed Care – PPO | Admitting: Physical Therapy

## 2019-04-19 ENCOUNTER — Encounter: Payer: Self-pay | Admitting: Physical Therapy

## 2019-04-19 DIAGNOSIS — M6281 Muscle weakness (generalized): Secondary | ICD-10-CM

## 2019-04-19 DIAGNOSIS — M62838 Other muscle spasm: Secondary | ICD-10-CM

## 2019-04-19 DIAGNOSIS — M542 Cervicalgia: Secondary | ICD-10-CM | POA: Diagnosis not present

## 2019-04-19 NOTE — Therapy (Signed)
Increased fascial restricitons, Decreased range of motion, Increased muscle spasms, Pain, Decreased strength,  Postural dysfunction  Visit Diagnosis: Cervicalgia  Muscle weakness (generalized)  Other muscle spasm     Problem List Patient Active Problem List   Diagnosis Date Noted  . Multiple thyroid nodules 03/08/2019  . Oropharyngeal dysphagia 03/08/2019  . Throat pain 02/14/2019  . Constipation 12/26/2018  . Abdominal pain 12/26/2018  . Brachial neuritis of right upper extremity 07/06/2018  . Well adult exam 03/16/2018  . Obesity 03/16/2018  . Conjunctivitis 07/14/2017  . Foreign body of right ear 12/01/2016  . Sweats, menopausal 10/20/2016  . Panic attacks 10/20/2016  . Loss of transverse plantar arch of right foot 09/07/2016  . Other bursal cyst of wrist 08/11/2016  . Posterior right knee pain 07/16/2016  . Knee pain, right 05/21/2016  . Hamstring strain, sequela 05/21/2016  . Wart viral 05/12/2016  . Contusion of knee 05/05/2016  . Fall involving sidewalk curb 05/05/2016  . Aphasia 03/13/2016  . Shoulder pain, bilateral 12/11/2015  . Fibromyalgia 12/11/2015  . Positional vertigo 11/15/2015  . Post concussion syndrome 11/01/2015  . Clavicle fracture 10/30/2015  . Hair loss 10/11/2015  . Cervical radicular pain 09/20/2015  . Pain in joint, shoulder region 09/11/2015  . Right wrist pain 08/07/2015  . Splinter in skin 07/30/2015  . Fall from ladder 07/30/2015  . Skull fracture with concussion ([place]) 07/30/2015  . Fracture of cervical vertebra, C6 ([place]) 07/30/2015  . Thoracic vertebral fracture ([place]) 07/30/2015  . Closed fracture of base of fourth metacarpal bone of left hand 06/30/2015  . Laceration of hand, left 06/30/2015  . Abdominal pain, epigastric 05/07/2015  . Scaly patch rash 02/26/2015  . Mitral valve regurgitation 09/27/2014  . PAT (paroxysmal atrial tachycardia) ([place]) 09/06/2014  . Tachycardia 07/31/2014  . Sprain of ankle 06/29/2014  . Earache, left 05/15/2013  . Dizziness 05/15/2013  . Asthmatic bronchitis 05/08/2013  . Benign paroxysmal positional  vertigo 05/08/2013  . Chest wall pain 01/26/2013  . LUQ abdominal pain 01/20/2013  . Insomnia 01/10/2013  . Depression (emotion) 01/10/2013  . URI, acute 08/25/2012  . Snoring 08/25/2012  . IBS (irritable bowel syndrome) 02/08/2012  . Multiple pulmonary nodules 02/08/2012  . Chest heaviness 11/16/2010  . GERD 05/24/2010  . SINUSITIS, MAXILLARY, CHRONIC 05/20/2010  . Myalgia and myositis 11/20/2009  . Fatigue 11/20/2009  . ARTHRALGIA 06/26/2009  . Cervicogenic headache 06/26/2009  . TOBACCO USE, QUIT 06/26/2009  . LOW BACK PAIN 02/20/2009  . Rosacea 12/27/2008  . PRURITUS 12/27/2008  . Allergic urticaria 12/27/2008  . B12 deficiency 01/02/2008    Regina [name], PTA 04/19/2019, 11:37 AM  [facility] [address], [place], [place], [address] Phone: [phone]   Fax:  [phone]  Name: Regina [name] MRN: [id number] Date of Birth: 1965-12-01  Increased fascial restricitons, Decreased range of motion, Increased muscle spasms, Pain, Decreased strength,  Postural dysfunction  Visit Diagnosis: Cervicalgia  Muscle weakness (generalized)  Other muscle spasm     Problem List Patient Active Problem List   Diagnosis Date Noted  . Multiple thyroid nodules 03/08/2019  . Oropharyngeal dysphagia 03/08/2019  . Throat pain 02/14/2019  . Constipation 12/26/2018  . Abdominal pain 12/26/2018  . Brachial neuritis of right upper extremity 07/06/2018  . Well adult exam 03/16/2018  . Obesity 03/16/2018  . Conjunctivitis 07/14/2017  . Foreign body of right ear 12/01/2016  . Sweats, menopausal 10/20/2016  . Panic attacks 10/20/2016  . Loss of transverse plantar arch of right foot 09/07/2016  . Other bursal cyst of wrist 08/11/2016  . Posterior right knee pain 07/16/2016  . Knee pain, right 05/21/2016  . Hamstring strain, sequela 05/21/2016  . Wart viral 05/12/2016  . Contusion of knee 05/05/2016  . Fall involving sidewalk curb 05/05/2016  . Aphasia 03/13/2016  . Shoulder pain, bilateral 12/11/2015  . Fibromyalgia 12/11/2015  . Positional vertigo 11/15/2015  . Post concussion syndrome 11/01/2015  . Clavicle fracture 10/30/2015  . Hair loss 10/11/2015  . Cervical radicular pain 09/20/2015  . Pain in joint, shoulder region 09/11/2015  . Right wrist pain 08/07/2015  . Splinter in skin 07/30/2015  . Fall from ladder 07/30/2015  . Skull fracture with concussion ([place]) 07/30/2015  . Fracture of cervical vertebra, C6 ([place]) 07/30/2015  . Thoracic vertebral fracture ([place]) 07/30/2015  . Closed fracture of base of fourth metacarpal bone of left hand 06/30/2015  . Laceration of hand, left 06/30/2015  . Abdominal pain, epigastric 05/07/2015  . Scaly patch rash 02/26/2015  . Mitral valve regurgitation 09/27/2014  . PAT (paroxysmal atrial tachycardia) ([place]) 09/06/2014  . Tachycardia 07/31/2014  . Sprain of ankle 06/29/2014  . Earache, left 05/15/2013  . Dizziness 05/15/2013  . Asthmatic bronchitis 05/08/2013  . Benign paroxysmal positional  vertigo 05/08/2013  . Chest wall pain 01/26/2013  . LUQ abdominal pain 01/20/2013  . Insomnia 01/10/2013  . Depression (emotion) 01/10/2013  . URI, acute 08/25/2012  . Snoring 08/25/2012  . IBS (irritable bowel syndrome) 02/08/2012  . Multiple pulmonary nodules 02/08/2012  . Chest heaviness 11/16/2010  . GERD 05/24/2010  . SINUSITIS, MAXILLARY, CHRONIC 05/20/2010  . Myalgia and myositis 11/20/2009  . Fatigue 11/20/2009  . ARTHRALGIA 06/26/2009  . Cervicogenic headache 06/26/2009  . TOBACCO USE, QUIT 06/26/2009  . LOW BACK PAIN 02/20/2009  . Rosacea 12/27/2008  . PRURITUS 12/27/2008  . Allergic urticaria 12/27/2008  . B12 deficiency 01/02/2008    Regina [name], PTA 04/19/2019, 11:37 AM  [facility] [address], [place], [place], [address] Phone: [phone]   Fax:  [phone]  Name: Regina [name] MRN: [id number] Date of Birth: 1965-12-01  [facility] [address] [place], [place], [address] Phone: [phone]   Fax:  [phone]  Physical Therapy Treatment  Patient Details  Name: Regina [name] MRN: [id number] Date of Birth: [birth year] Referring Provider (PT): Dr. Alain Marion   Encounter Date: 04/19/2019  PT End of Session - 04/19/19 0805    Visit Number  11    Date for PT Re-Evaluation  04/27/19    Authorization Type  BCBS    PT Start Time  0802    PT Stop Time  0845    PT Time Calculation (min)  43 min    Activity Tolerance  Patient tolerated treatment well    Behavior During Therapy  [facility] for tasks assessed/performed       Past Medical History:  Diagnosis Date  . Allergic urticaria   . ARTHRALGIA   . Arthritis   . Atrial fibrillation ([place])   . B12 DEFICIENCY   . CFS (chronic fatigue syndrome)   . Endometriosis   . Epidural hemorrhage with loss of consciousness ([place]) 06/29/15  . Fibroid   . Fibromyalgia   . Fracture of cervical vertebra, C6 ([place]) 06/29/15  . Gastritis   . GERD   . Heart murmur   . History of endometriosis   . IBS (irritable bowel syndrome)   . Internal hemorrhoids   . MVP (mitral valve prolapse)   . Osteoarthritis   . Rosacea   . Skin cancer of arm, right   . Thoracic compression fracture ([place]) 06/29/15   T2-T6    Past Surgical History:  Procedure Laterality Date  . ABDOMINAL HYSTERECTOMY     partial  . ABDOMINAL HYSTERECTOMY    . ANAL FISSURE REPAIR     Dr Rise [name] 2009  . APPENDECTOMY    . COLPOSCOPY    . HEMORRHOID SURGERY     Dr. Rise [name] 2009  . OVARIAN CYST REMOVAL    . RT BUNIONECTOMT    . TONSILLECTOMY AND ADENOIDECTOMY    . TUBAL LIGATION    . UTERINE FIBROID SURGERY      There were no vitals filed for this visit.      [facility] PT Assessment - 04/19/19 0001      AROM   Cervical - Right Rotation  50    Cervical - Left Rotation  57                   [facility] Adult PT  Treatment/Exercise - 04/19/19 0001      Shoulder Exercises: Standing   Extension  Strengthening;Both;15 reps;Theraband    Theraband Level (Shoulder Extension)  Level 3 (Green)    Extension Limitations  Does unilaterally    Row  Strengthening;Both;15 reps;Theraband    Theraband Level (Shoulder Row)  Level 3 (Green)      Shoulder Exercises: ROM/Strengthening   UBE (Upper Arm Bike)  L2 2x2 for warm up and       Manual Therapy   Soft tissue mobilization  cervical LT <  RT: scalenes, SCM, occiput, TP release Bil SCM: PROM Bil rotation, Intermittent traction               PT Short Term Goals - 03/28/19 0839      PT SHORT TERM GOAL #1   Title  independent with initial HEP    Baseline  "I'm never going to do my home exercises b/c I lack motivation."    Status  Not Met      PT

## 2019-04-20 ENCOUNTER — Other Ambulatory Visit: Payer: Self-pay | Admitting: Otolaryngology

## 2019-04-20 DIAGNOSIS — R1312 Dysphagia, oropharyngeal phase: Secondary | ICD-10-CM

## 2019-04-21 ENCOUNTER — Encounter: Payer: BC Managed Care – PPO | Admitting: Physical Therapy

## 2019-04-26 ENCOUNTER — Ambulatory Visit: Payer: BC Managed Care – PPO | Admitting: Physical Therapy

## 2019-04-26 ENCOUNTER — Other Ambulatory Visit: Payer: Self-pay

## 2019-04-26 ENCOUNTER — Encounter: Payer: Self-pay | Admitting: Physical Therapy

## 2019-04-26 DIAGNOSIS — M542 Cervicalgia: Secondary | ICD-10-CM

## 2019-04-26 DIAGNOSIS — M6281 Muscle weakness (generalized): Secondary | ICD-10-CM

## 2019-04-26 DIAGNOSIS — M62838 Other muscle spasm: Secondary | ICD-10-CM

## 2019-04-26 NOTE — Addendum Note (Signed)
Addended by: Danie Binder on: 04/26/2019 04:34 PM   Modules accepted: Orders

## 2019-04-26 NOTE — Therapy (Addendum)
[facility] [address] [place], [place], [address] Phone: [phone]   Fax:  [phone]  Physical Therapy Treatment  Patient Details  Name: Regina [name] MRN: [id number] Date of Birth: 1965-04-13 Referring Provider (PT): Dr. Alain Marion   Encounter Date: 04/26/2019  PT End of Session - 04/26/19 1500    Visit Number  12    Date for PT Re-Evaluation  06/07/19    Authorization Type  [employer]    PT Start Time  1458   Pt 15 min late   PT Stop Time  1545    PT Time Calculation (min)  47 min    Activity Tolerance  Patient tolerated treatment well    Behavior During Therapy  [facility] for tasks assessed/performed       Past Medical History:  Diagnosis Date  . Allergic urticaria   . ARTHRALGIA   . Arthritis   . Atrial fibrillation ([place])   . B12 DEFICIENCY   . CFS (chronic fatigue syndrome)   . Endometriosis   . Epidural hemorrhage with loss of consciousness ([place]) 06/29/15  . Fibroid   . Fibromyalgia   . Fracture of cervical vertebra, C6 ([place]) 06/29/15  . Gastritis   . GERD   . Heart murmur   . History of endometriosis   . IBS (irritable bowel syndrome)   . Internal hemorrhoids   . MVP (mitral valve prolapse)   . Osteoarthritis   . Rosacea   . Skin cancer of arm, right   . Thoracic compression fracture ([place]) 06/29/15   T2-T6    Past Surgical History:  Procedure Laterality Date  . ABDOMINAL HYSTERECTOMY     partial  . ABDOMINAL HYSTERECTOMY    . ANAL FISSURE REPAIR     Dr Rise [name] 2009  . APPENDECTOMY    . COLPOSCOPY    . HEMORRHOID SURGERY     Dr. Rise [name] 2009  . OVARIAN CYST REMOVAL    . RT BUNIONECTOMT    . TONSILLECTOMY AND ADENOIDECTOMY    . TUBAL LIGATION    . UTERINE FIBROID SURGERY      There were no vitals filed for this visit.  Subjective Assessment - 04/26/19 1502    Subjective  i have been recently suffering with an intense HA ( not a migraine per pt) that sometimes goes away and  sometimes does not. Pt has been getting good sleep She feels the motion in her neck is still ok but it does feel today it is tight. She does report new neurologic complaints regarding the Rt side of her face/skull/eye.    Pertinent History  broke neck C6, T3-7 in 2017 no hardware;  fracture right shoulder;  dizziness;  memory issues:  states she does better not being overloaded with instructions/ HEP;  states she is not motivated to exercise;  had [facility] PT at [facility] ([facility] response to [facility]) and [facility] for mostly massage and limited ex    Currently in Pain?  Yes    Pain Score  3     Pain Location  Neck    Pain Orientation  Left    Pain Descriptors / Indicators  Dull;Aching    Aggravating Factors   Not sure    Pain Relieving Factors  Soft tissue work, eating my proper diet.    Multiple Pain Sites  No         [facility] PT Assessment - 04/26/19 0001      Assessment   Medical Diagnosis  cervical radicular pain     Referring Provider (PT)  Dr. Alain Marion      Observation/Other Assessments   Focus on Therapeutic Outcomes (FOTO)   39% limittaion      AROM   Cervical Flexion  50    Cervical Extension  60    Cervical - Right Side Bend  38    Cervical - Left Side Bend  45    Cervical - Right Rotation  55    Cervical - Left Rotation  55      Strength   Overall Strength Comments  Shoulder Bil general 4/5, periscapula 4/5    Cervical Flexion  4/5    Cervical Extension  4/5                             PT Short Term Goals - 04/26/19 1506      PT SHORT TERM GOAL #1   Title  independent with initial HEP    Time  4    Period  Weeks    Status  Partially Met   Does semi often     PT SHORT TERM GOAL #2   Title  The patient will report a 25% improvement in sleep    Time  4    Period  Weeks    Status  On-going   20%     PT SHORT TERM GOAL #3   Title  Improved cervical rotation to 40 degrees and improved sidebending to 30 degrees bilaterally needed for  driving    Time  4    Period  Weeks    Status  Achieved        PT Long Term Goals - 04/26/19 1508      PT LONG TERM GOAL #1   Title  independent with HEP and understand how to progress herself    Time  6    Period  Weeks    Status  On-going    Target Date  06/07/19      PT LONG TERM GOAL #2   Title  The 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   Target Date  06/07/19      PT LONG TERM GOAL #5   Title  FOTO score </= 33% limitation    Baseline  39%    Time  6    Period  Weeks    Status  On-going   39%   Target Date  06/07/19            Plan - 04/26/19 1501    Clinical Impression  Statement  Pt having more neurologic symtpoms along the Rt side of her face/eye/andfrontal part of cranium. She is usure of the genesis and will consult her MD. Her cervical ROM improved as well her strength in her shoulders and upper back/scapular muscles. 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 Pt is now more receptive to doing more HEP for strength and endurance. LTG not met but progressing towads.    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; OA; fibromyalgia; lack of motivation    Examination-Activity Limitations  Lift;Sleep;Sit    Examination-Participation Restrictions  Driving;Community Activity;Other;School    Stability/Clinical Decision Making  Evolving/Moderate complexity    Rehab Potential  Good    PT Frequency  1x / week    PT Duration  6 weeks    PT Treatment/Interventions  ADLs/Self Care Home Management;Cryotherapy;Electrical Stimulation;Ultrasound;Moist Heat;Traction;Therapeutic exercise;Therapeutic activities;Neuromuscular re-education;Manual techniques;Patient/family education;Dry needling;Taping    PT Next Visit Plan  PT to renew pt for 1x week for 6 weeks.    PT Home Exercise Plan  Access Code: [email or web address]    Consulted and Agree with Plan of Care  Patient       Patient will benefit from skilled therapeutic intervention in order to improve the following deficits and impairments:  Increased fascial restricitons, Decreased range of motion, Increased muscle spasms, Pain, Decreased strength, Postural dysfunction  Visit Diagnosis: Cervicalgia - Plan: PT plan of care cert/re-cert  Muscle weakness (generalized) - Plan: PT plan of care cert/re-cert  Other muscle spasm - Plan: PT plan of care cert/re-cert     Problem List Patient Active Problem List   Diagnosis Date Noted  . Multiple thyroid nodules 03/08/2019  . Oropharyngeal dysphagia 03/08/2019  . Throat pain 02/14/2019  . Constipation 12/26/2018  . Abdominal pain 12/26/2018  . Brachial neuritis of right upper extremity 07/06/2018  . Well adult exam 03/16/2018  . Obesity 03/16/2018  . Conjunctivitis 07/14/2017  . Foreign body of right ear 12/01/2016  . Sweats, menopausal 10/20/2016  . Panic attacks 10/20/2016  . Loss of transverse plantar arch of right foot 09/07/2016  . Other bursal cyst of wrist 08/11/2016  . Posterior right knee pain 07/16/2016  . Knee pain, right 05/21/2016  . Hamstring strain, sequela 05/21/2016  .  Wart viral 05/12/2016  . Contusion of knee 05/05/2016  . Fall involving sidewalk curb 05/05/2016  . Aphasia 03/13/2016  . Shoulder pain, bilateral 12/11/2015  . Fibromyalgia 12/11/2015  . Positional vertigo 11/15/2015  . Post concussion syndrome 11/01/2015  . Clavicle fracture 10/30/2015  . Hair loss 10/11/2015  . Cervical radicular pain 09/20/2015  . Pain in joint, shoulder region 09/11/2015  . Right wrist pain 08/07/2015  . Splinter in skin 07/30/2015  . Fall from ladder 07/30/2015  . Skull fracture with concussion ([place]) 07/30/2015  . Fracture of cervical vertebra, C6 ([place]) 07/30/2015  . Thoracic vertebral fracture ([place]) 07/30/2015  . Closed fracture of base of fourth metacarpal bone of left hand 06/30/2015  . Laceration of hand, left 06/30/2015  . Abdominal pain, epigastric 05/07/2015  . Scaly patch rash 02/26/2015  . Mitral valve regurgitation 09/27/2014  . PAT (paroxysmal atrial tachycardia) ([place]) 09/06/2014  . Tachycardia 07/31/2014  . Sprain of ankle 06/29/2014  . Earache, left 05/15/2013  . Dizziness 05/15/2013  . Asthmatic bronchitis 05/08/2013  . Benign paroxysmal positional vertigo 05/08/2013  .  Chest wall pain 01/26/2013  . LUQ abdominal pain 01/20/2013  . Insomnia 01/10/2013  . Depression (emotion) 01/10/2013  . URI, acute 08/25/2012  . Snoring 08/25/2012  . IBS (irritable bowel syndrome) 02/08/2012  . Multiple pulmonary nodules 02/08/2012  . Chest heaviness 11/16/2010  . GERD 05/24/2010  . SINUSITIS, MAXILLARY, CHRONIC 05/20/2010  . Myalgia and myositis 11/20/2009  . Fatigue 11/20/2009  . ARTHRALGIA 06/26/2009  . Cervicogenic headache 06/26/2009  . TOBACCO USE, QUIT 06/26/2009  . LOW BACK PAIN 02/20/2009  . Rosacea 12/27/2008  . PRURITUS 12/27/2008  . Allergic urticaria 12/27/2008  . B12 deficiency 01/02/2008   Sigurd Sos, PT 04/26/19 4:33 PM Myrene Galas, PTA 04/26/19 5:01 PM  [facility] [address], [place], [place], [address] Phone: [phone]   Fax:  [phone]  Name: Regina [name] MRN: [id number] Date of Birth: 05/22/1965

## 2019-04-27 ENCOUNTER — Encounter: Payer: BC Managed Care – PPO | Admitting: Physical Therapy

## 2019-05-03 ENCOUNTER — Ambulatory Visit: Payer: BC Managed Care – PPO | Attending: Internal Medicine | Admitting: Physical Therapy

## 2019-05-03 ENCOUNTER — Other Ambulatory Visit: Payer: Self-pay

## 2019-05-03 ENCOUNTER — Encounter: Payer: Self-pay | Admitting: Internal Medicine

## 2019-05-03 ENCOUNTER — Encounter: Payer: Self-pay | Admitting: Physical Therapy

## 2019-05-03 ENCOUNTER — Ambulatory Visit: Payer: BC Managed Care – PPO | Admitting: Internal Medicine

## 2019-05-03 DIAGNOSIS — M542 Cervicalgia: Secondary | ICD-10-CM

## 2019-05-03 DIAGNOSIS — G8929 Other chronic pain: Secondary | ICD-10-CM

## 2019-05-03 DIAGNOSIS — G44329 Chronic post-traumatic headache, not intractable: Secondary | ICD-10-CM | POA: Diagnosis not present

## 2019-05-03 DIAGNOSIS — M545 Low back pain: Secondary | ICD-10-CM | POA: Insufficient documentation

## 2019-05-03 DIAGNOSIS — M5441 Lumbago with sciatica, right side: Secondary | ICD-10-CM

## 2019-05-03 DIAGNOSIS — M25511 Pain in right shoulder: Secondary | ICD-10-CM

## 2019-05-03 DIAGNOSIS — E559 Vitamin D deficiency, unspecified: Secondary | ICD-10-CM | POA: Diagnosis not present

## 2019-05-03 DIAGNOSIS — M62838 Other muscle spasm: Secondary | ICD-10-CM | POA: Insufficient documentation

## 2019-05-03 DIAGNOSIS — M6281 Muscle weakness (generalized): Secondary | ICD-10-CM | POA: Insufficient documentation

## 2019-05-03 DIAGNOSIS — E538 Deficiency of other specified B group vitamins: Secondary | ICD-10-CM

## 2019-05-03 LAB — VITAMIN D 25 HYDROXY (VIT D DEFICIENCY, FRACTURES): VITD: 19.67 ng/mL — ABNORMAL LOW (ref 30.00–100.00)

## 2019-05-03 LAB — VITAMIN B12: Vitamin B-12: 198 pg/mL — ABNORMAL LOW (ref 211–911)

## 2019-05-03 NOTE — Progress Notes (Signed)
Subjective:  Patient ID: Regina [name], female    DOB: 03-Apr-1965  Age: 54 y.o. MRN: [id number]  CC: No chief complaint on file.   HPI KEVIANNA KLEPINGER presents for spinal pain between shoulders x3 d In PT  - better  Outpatient Medications Prior to Visit  Medication Sig Dispense Refill  . albuterol (VENTOLIN HFA) 108 (90 Base) MCG/ACT inhaler INHALE 2 PUFFS INTO THE LUNGS EVERY 6 (SIX) HOURS AS NEEDED FOR WHEEZING OR SHORTNESS OF BREATH (BEFORE EXERCISING). 8.5 g 3  . amoxicillin-clavulanate (AUGMENTIN) 875-125 MG tablet Take 1 tablet by mouth 2 (two) times daily. 20 tablet 0  . CASCARA SAGRADA PO Take by mouth.    . cephALEXin (KEFLEX) 500 MG capsule Take 1 capsule (500 mg total) by mouth 4 (four) times daily. 28 capsule 0  . Cholecalciferol (GNP VITAMIN D) 1000 units tablet Take 2 tablets (2,000 Units total) by mouth daily. 100 tablet 3  . cyanocobalamin (,VITAMIN B-12,) 1000 MCG/ML injection Inject 1 mL (1,000 mcg total) into the skin every 14 (fourteen) days. 10 mL 11  . fluocinonide (LIDEX) 0.05 % external solution APPLY ON THE SCALP DAILY    . gabapentin (NEURONTIN) 300 MG capsule nightly 30 capsule 3  . linaclotide (LINZESS) 290 MCG CAPS capsule Take 1 capsule (290 mcg total) by mouth daily. 90 capsule 3  . pantoprazole (PROTONIX) 40 MG tablet Take 1 tablet (40 mg total) by mouth daily. 90 tablet 3  . Turmeric 500 MG TABS Take by mouth 2 (two) times daily.     No facility-administered medications prior to visit.    ROS: Review of Systems  Constitutional: Negative for activity change, appetite change, chills, fatigue and unexpected weight change.  HENT: Negative for congestion, mouth sores and sinus pressure.   Eyes: Negative for visual disturbance.  Respiratory: Negative for cough and chest tightness.   Gastrointestinal: Negative for abdominal pain and nausea.  Genitourinary: Negative for difficulty urinating, frequency and vaginal pain.  Musculoskeletal: Positive for  arthralgias, back pain, gait problem, joint swelling, neck pain and neck stiffness.  Skin: Negative for pallor and rash.  Neurological: Negative for dizziness, tremors, weakness, numbness and headaches.  Psychiatric/Behavioral: Negative for confusion and sleep disturbance.    Objective:  BP 126/70 (BP Location: Left Arm, Patient Position: Sitting, Cuff Size: Normal)   Pulse 67   Temp 97.8 F (36.6 C) (Oral)   Ht 5\' 4"  (1.626 m)   SpO2 98%   BMI 29.52 kg/m   BP Readings from Last 3 Encounters:  05/03/19 126/70  02/14/19 120/68  01/24/19 118/64    Wt Readings from Last 3 Encounters:  02/14/19 172 lb (78 kg)  01/24/19 179 lb (81.2 kg)  12/27/18 177 lb (80.3 kg)    Physical Exam Constitutional:      General: She is not in acute distress.    Appearance: She is well-developed.  HENT:     Head: Normocephalic.     Right Ear: External ear normal.     Left Ear: External ear normal.     Nose: Nose normal.  Eyes:     General:        Right eye: No discharge.        Left eye: No discharge.     Conjunctiva/sclera: Conjunctivae normal.     Pupils: Pupils are equal, round, and reactive to light.  Neck:     Thyroid: No thyromegaly.     Vascular: No JVD.     Trachea: No tracheal  deviation.  Cardiovascular:     Rate and Rhythm: Normal rate and regular rhythm.     Heart sounds: Normal heart sounds.  Pulmonary:     Effort: No respiratory distress.     Breath sounds: No stridor. No wheezing.  Abdominal:     General: Bowel sounds are normal. There is no distension.     Palpations: Abdomen is soft. There is no mass.     Tenderness: There is no abdominal tenderness. There is no guarding or rebound.  Musculoskeletal:        General: Tenderness present.     Cervical back: Normal range of motion and neck supple.  Lymphadenopathy:     Cervical: No cervical adenopathy.  Skin:    Findings: No erythema or rash.  Neurological:     Cranial Nerves: No cranial nerve deficit.     Motor:  No abnormal muscle tone.     Coordination: Coordination normal.     Deep Tendon Reflexes: Reflexes normal.  Psychiatric:        Behavior: Behavior normal.        Thought Content: Thought content normal.        Judgment: Judgment normal.    Paraspinal muscles on the neck, thor and LS spine, traps - very tender, stiff Lab Results  Component Value Date   WBC 5.8 12/27/2018   HGB 13.0 12/27/2018   HCT 38.7 12/27/2018   PLT 330.0 12/27/2018   GLUCOSE 90 12/27/2018   CHOL 213 (H) 08/23/2018   TRIG 118 08/23/2018   HDL 59 08/23/2018   LDLCALC 130 (H) 08/23/2018   ALT 17 12/27/2018   AST 15 12/27/2018   NA 138 12/27/2018   K 4.2 12/27/2018   CL 102 12/27/2018   CREATININE 0.71 12/27/2018   BUN 12 12/27/2018   CO2 28 12/27/2018   TSH 1.20 12/27/2018   INR 1.16 06/29/2015   HGBA1C 5.4 08/23/2018    US THYROID  Result Date: 03/30/2019 CLINICAL DATA:  Nodules EXAM: THYROID ULTRASOUND TECHNIQUE: Ultrasound examination of the thyroid gland and adjacent soft tissues was performed. COMPARISON:  None available FINDINGS: Parenchymal Echotexture: Mildly heterogenous Isthmus: 0.2 cm thickness Right lobe: 4.4 x 1.7 x 1.3 cm Left lobe: 4.6 x 1.4 x 1.4 cm _________________________________________________________ Estimated total number of nodules >/= 1 cm: 0 Number of spongiform nodules >/=  2 cm not described below (TR1): 0 Number of mixed cystic and solid nodules >/= 1.5 cm not described below (TR2): 0 _________________________________________________________ 0.7 cm hypoechoic nodule without calcifications, mid right; This nodule does NOT meet TI-RADS criteria for biopsy or dedicated follow-up. IMPRESSION: Normal-sized thyroid with single subcentimeter right nodule, which does not meet criteria for biopsy or dedicated imaging follow-up. The above is in keeping with the ACR TI-RADS recommendations - J Am Coll Radiol 2017;14:587-595. Electronically Signed   By: Lucrezia Europe M.D.   On: 03/30/2019 15:28     Assessment & Plan:    Walker Kehr, MD

## 2019-05-03 NOTE — Assessment & Plan Note (Signed)
PT

## 2019-05-03 NOTE — Assessment & Plan Note (Signed)
On B12 

## 2019-05-03 NOTE — Assessment & Plan Note (Signed)
Thoracic back PT

## 2019-05-03 NOTE — Assessment & Plan Note (Signed)
Labs On Vit D

## 2019-05-03 NOTE — Assessment & Plan Note (Signed)
[facility] cervical pain

## 2019-05-03 NOTE — Therapy (Signed)
[facility] [address] [place], [place], [address] Phone: [phone]   Fax:  [phone]  Physical Therapy Treatment  Patient Details  Name: Regina [name] MRN: [id number] Date of Birth: Aug 04, 1965 Referring Provider (PT): Dr. Posey Rea   Encounter Date: 05/03/2019  PT End of Session - 05/03/19 0844    Visit Number  13    Date for PT Re-Evaluation  06/07/19    Authorization Type  BCBS    Authorization - Visit Number  1    Authorization - Number of Visits  6    PT Start Time  0808   pT late   PT Stop Time  0844    PT Time Calculation (min)  36 min    Activity Tolerance  Patient tolerated treatment well    Behavior During Therapy  [facility] for tasks assessed/performed       Past Medical History:  Diagnosis Date  . Allergic urticaria   . ARTHRALGIA   . Arthritis   . Atrial fibrillation (HCC)   . B12 DEFICIENCY   . CFS (chronic fatigue syndrome)   . Endometriosis   . Epidural hemorrhage with loss of consciousness (HCC) 06/29/15  . Fibroid   . Fibromyalgia   . Fracture of cervical vertebra, C6 (HCC) 06/29/15  . Gastritis   . GERD   . Heart murmur   . History of endometriosis   . IBS (irritable bowel syndrome)   . Internal hemorrhoids   . MVP (mitral valve prolapse)   . Osteoarthritis   . Rosacea   . Skin cancer of arm, right   . Thoracic compression fracture (HCC) 06/29/15   T2-T6    Past Surgical History:  Procedure Laterality Date  . ABDOMINAL HYSTERECTOMY     partial  . ABDOMINAL HYSTERECTOMY    . ANAL FISSURE REPAIR     Dr Zachery Dakins 2009  . APPENDECTOMY    . COLPOSCOPY    . HEMORRHOID SURGERY     Dr. Zachery Dakins 2009  . OVARIAN CYST REMOVAL    . RT BUNIONECTOMT    . TONSILLECTOMY AND ADENOIDECTOMY    . TUBAL LIGATION    . UTERINE FIBROID SURGERY      There were no vitals filed for this visit.  Subjective Assessment - 05/03/19 0845    Subjective  Pt reports those headaches essentially went  away and she had a few very good days. Beginning this past Sunday pt reports an intense pain began from the top of her left shoulder blade and distal to. This pain  has been constant and especailly increases when she reaches for something or gets up from seated position. She has an MD appt right after her PT.    Pertinent History  broke neck C6, T3-7 in 2017 no hardware;  fracture right shoulder;  dizziness;  memory issues:  states she does better not being overloaded with instructions/ HEP;  states she is not motivated to exercise;  [facility] ([facility]) and [facility] for mostly massage and limited ex    Currently in Pain?  Yes    Pain Score  6     Pain Location  Scapula    Pain Orientation  Left    Pain Descriptors / Indicators  Shooting;Sharp;Constant    Aggravating Factors   not sure, definitely movement increases her current pain.    Pain Relieving Factors  Not sure    Multiple Pain Sites  No  [facility] Adult PT Treatment/Exercise - 05/03/19 0001      Manual Therapy   Soft tissue mobilization  Lt upper trap and almost a straight line distally; medial scap boarder, mid & low traps/lats, QL. Multiple active trigger points very tender.    Pt RT sidelying              PT Short Term Goals - 04/26/19 1506      PT SHORT TERM GOAL #1   Title  independent with initial HEP    Time  4    Period  Weeks    Status  Partially Met   Does semi often     PT SHORT TERM GOAL #2   Title  The patient will report a 25% improvement in sleep    Time  4    Period  Weeks    Status  On-going   20%     PT SHORT TERM GOAL #3   Title  Improved cervical rotation to 40 degrees and improved sidebending to 30 degrees bilaterally needed for driving    Time  4    Period  Weeks    Status  Achieved        PT Long Term Goals - 04/26/19 1508      PT LONG TERM GOAL #1   Title  independent with HEP and understand how to progress  herself    Time  6    Period  Weeks    Status  On-going    Target Date  06/07/19      PT LONG TERM GOAL #2   Title  The 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   Target Date  06/07/19      PT LONG TERM GOAL #5   Title  FOTO score </= 33% limitation    Baseline  39%    Time  6    Period  Weeks    Status  On-going   39%   Target Date  06/07/19            Plan - 05/03/19 0850    Clinical Impression Statement  Pt arrives to PT ambulating slower than typical and heavy guarding through the left side of her trunk. She reports pain has been intense for 3 days, she is unsure why. She reports her neck is not painful and contiues to do very well. Soft tissues felt like one large spasm  from her Lt upper trap but mainly diatal scapula and QL. This did improve with lengthy manual work and pain was greatly reduced per pt. She is still scheduled to see her  MD today for this pain. Pt was visably able to put her pullover on at the end of her session with much less pain.    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; OA; fibromyalgia; lack of  motivation    Examination-Activity Limitations  Lift;Sleep;Sit    Examination-Participation Restrictions  Driving;Community Activity;Other;School    Stability/Clinical Decision Making  Evolving/Moderate complexity    Rehab Potential  Good    PT Frequency  1x / week    PT Duration  6 weeks    PT Treatment/Interventions  ADLs/Self Care Home Management;Cryotherapy;Electrical Stimulation;Ultrasound;Moist Heat;Traction;Therapeutic exercise;Therapeutic activities;Neuromuscular re-education;Manual techniques;Patient/family education;Dry needling;Taping    PT Next Visit Plan  Assess pain and see what her MD said.    PT Home Exercise Plan  Access Code: [email or web address]    Consulted and Agree with Plan of Care  Patient       Patient will benefit from skilled therapeutic intervention in order to improve the following deficits and impairments:  Increased fascial restricitons, Decreased range of motion, Increased muscle spasms, Pain, Decreased strength, Postural dysfunction  Visit Diagnosis: Cervicalgia  Muscle weakness (generalized)  Other muscle spasm     Problem List Patient Active Problem List   Diagnosis Date Noted  . Multiple thyroid nodules 03/08/2019  . Oropharyngeal dysphagia 03/08/2019  . Throat pain 02/14/2019  . Constipation 12/26/2018  . Abdominal pain 12/26/2018  . Brachial neuritis of right upper extremity 07/06/2018  . Well adult exam 03/16/2018  . Obesity 03/16/2018  . Conjunctivitis 07/14/2017  . Foreign body of right ear 12/01/2016  . Sweats, menopausal 10/20/2016  . Panic attacks 10/20/2016  . Loss of transverse plantar arch of right foot 09/07/2016  . Other bursal cyst of wrist 08/11/2016  . Posterior right knee pain 07/16/2016  . Knee pain, right 05/21/2016  . Hamstring strain, sequela 05/21/2016  . Wart viral 05/12/2016  . Contusion of knee 05/05/2016  . Fall involving sidewalk curb 05/05/2016  . Aphasia 03/13/2016  . Shoulder pain, bilateral 12/11/2015  .  Fibromyalgia 12/11/2015  . Positional vertigo 11/15/2015  . Post concussion syndrome 11/01/2015  . Clavicle fracture 10/30/2015  . Hair loss 10/11/2015  . Cervical radicular pain 09/20/2015  . Pain in joint, shoulder region 09/11/2015  . Right wrist pain 08/07/2015  . Splinter in skin 07/30/2015  . Fall from ladder 07/30/2015  . Skull fracture with concussion (HCC) 07/30/2015  . Fracture of cervical vertebra, C6 (HCC) 07/30/2015  . Thoracic vertebral fracture (HCC) 07/30/2015  . Closed fracture of base of fourth metacarpal bone of left hand 06/30/2015  . Laceration of hand, left 06/30/2015  . Abdominal pain, epigastric 05/07/2015  . Scaly patch rash 02/26/2015  . Mitral valve regurgitation 09/27/2014  . PAT (paroxysmal atrial tachycardia) (HCC) 09/06/2014  . Tachycardia 07/31/2014  . Sprain of ankle 06/29/2014  . Earache, left 05/15/2013  . Dizziness 05/15/2013  . Asthmatic bronchitis 05/08/2013  . Benign paroxysmal positional vertigo 05/08/2013  . Chest wall pain 01/26/2013  . LUQ abdominal pain 01/20/2013  . Insomnia 01/10/2013  . Depression (emotion) 01/10/2013  . URI, acute 08/25/2012  . Snoring 08/25/2012  . IBS (irritable bowel syndrome) 02/08/2012  . Multiple pulmonary nodules 02/08/2012  . Chest heaviness 11/16/2010  . GERD 05/24/2010  . SINUSITIS, MAXILLARY, CHRONIC 05/20/2010  . Myalgia and myositis 11/20/2009  . Fatigue 11/20/2009  . ARTHRALGIA 06/26/2009  . Cervicogenic  headache 06/26/2009  . TOBACCO USE, QUIT 06/26/2009  . LOW BACK PAIN 02/20/2009  . Rosacea 12/27/2008  . PRURITUS 12/27/2008  . Allergic urticaria 12/27/2008  . B12 deficiency 01/02/2008    Mickaela Starlin, PTA 05/03/2019, 10:34 AM  [facility] [address] [place], [place], [address] Phone: [phone]   Fax:  [phone]  Name: Regina [name] MRN: [id number] Date of Birth: May 26, 1965

## 2019-05-10 ENCOUNTER — Other Ambulatory Visit: Payer: Self-pay

## 2019-05-10 ENCOUNTER — Encounter: Payer: Self-pay | Admitting: Physical Therapy

## 2019-05-10 ENCOUNTER — Ambulatory Visit: Payer: BC Managed Care – PPO | Admitting: Physical Therapy

## 2019-05-10 DIAGNOSIS — M6281 Muscle weakness (generalized): Secondary | ICD-10-CM

## 2019-05-10 DIAGNOSIS — M542 Cervicalgia: Secondary | ICD-10-CM

## 2019-05-10 DIAGNOSIS — M62838 Other muscle spasm: Secondary | ICD-10-CM

## 2019-05-10 NOTE — Therapy (Signed)
[facility] [address] [place], [place], [address] Phone: [phone]   Fax:  [phone]  Physical Therapy Treatment  Patient Details  Name: Regina [name] MRN: [id number] Date of Birth: [birth year] Referring Provider (PT): Dr. Alain Marion   Encounter Date: 05/10/2019  PT End of Session - 05/10/19 0814    Visit Number  14    Date for PT Re-Evaluation  06/07/19    Authorization Type  BCBS    PT Start Time  [phone]   Pt late   PT Stop Time  0858    PT Time Calculation (min)  46 min    Activity Tolerance  Patient tolerated treatment well    Behavior During Therapy  [facility] for tasks assessed/performed       Past Medical History:  Diagnosis Date  . Allergic urticaria   . ARTHRALGIA   . Arthritis   . Atrial fibrillation ([place])   . B12 DEFICIENCY   . CFS (chronic fatigue syndrome)   . Endometriosis   . Epidural hemorrhage with loss of consciousness ([place]) 06/29/15  . Fibroid   . Fibromyalgia   . Fracture of cervical vertebra, C6 ([place]) 06/29/15  . Gastritis   . GERD   . Heart murmur   . History of endometriosis   . IBS (irritable bowel syndrome)   . Internal hemorrhoids   . MVP (mitral valve prolapse)   . Osteoarthritis   . Rosacea   . Skin cancer of arm, right   . Thoracic compression fracture ([place]) 06/29/15   T2-T6    Past Surgical History:  Procedure Laterality Date  . ABDOMINAL HYSTERECTOMY     partial  . ABDOMINAL HYSTERECTOMY    . ANAL FISSURE REPAIR     Dr Rise [name] 2009  . APPENDECTOMY    . COLPOSCOPY    . HEMORRHOID SURGERY     Dr. Rise [name] 2009  . OVARIAN CYST REMOVAL    . RT BUNIONECTOMT    . TONSILLECTOMY AND ADENOIDECTOMY    . TUBAL LIGATION    . UTERINE FIBROID SURGERY      There were no vitals filed for this visit.  Subjective Assessment - 05/10/19 0817    Subjective  That last treatment was amazing. i went to MD with less pain so nothing really came of that visit. I am feeling pretty  good today.    Pertinent History  broke neck C6, T3-7 in 2017 no hardware;  fracture right shoulder;  dizziness;  memory issues:  states she does better not being overloaded with instructions/ HEP;  states she is not motivated to exercise;  [facility] at [facility] ([facility] to [facility]) and [facility] for mostly massage and limited ex    Limitations  Other (comment)    Currently in Pain?  Yes   KNees are aching today.                      [facility] Adult PT Treatment/Exercise - 05/10/19 0001      Shoulder Exercises: ROM/Strengthening   UBE (Upper Arm Bike)  L1 3x3 PTA discussed status with pt      Moist Heat Therapy   Number Minutes Moist Heat  10 Minutes    Moist Heat Location  Cervical   post session     Manual Therapy   Soft tissue mobilization  cervical LT <  RT: scalenes, SCM, occiput, TP release Bil SCM: PROM Bil rotation, Intermittent traction  PT Education - 05/10/19 1242    Education Details  HEP: trunk side bend stretching and rotation to do throughout the day.    Person(s) Educated  Patient    Methods  Explanation;Demonstration;Verbal cues;Handout    Comprehension  Returned demonstration;Verbalized understanding       PT Short Term Goals - 04/26/19 1506      PT SHORT TERM GOAL #1   Title  independent with initial HEP    Time  4    Period  Weeks    Status  Partially Met   Does semi often     PT SHORT TERM GOAL #2   Title  The patient will report a 25% improvement in sleep    Time  4    Period  Weeks    Status  On-going   20%     PT SHORT TERM GOAL #3   Title  Improved cervical rotation to 40 degrees and improved sidebending to 30 degrees bilaterally needed for driving    Time  4    Period  Weeks    Status  Achieved        PT Long Term Goals - 04/26/19 1508      PT LONG TERM GOAL #1   Title  independent with HEP and understand how to progress herself    Time  6    Period  Weeks    Status  On-going    Target  Date  06/07/19      PT LONG TERM GOAL #2   Title  The 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   Target Date  06/07/19      PT LONG TERM GOAL #5   Title  FOTO score </= 33% limitation    Baseline  39%    Time  6    Period  Weeks    Status  On-going   39%   Target Date  06/07/19            Plan - 05/10/19 0815    Clinical Impression Statement  Pt reports the manual work we did last session was "a blessing" as she didn't hurt for  the rest of the day and presented "just fine to my MD." Pt reports she is finally ready to accept she has to do some exercises on her own to feel better but don't give too many. 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; OA; fibromyalgia; lack of motivation    Examination-Activity Limitations  Lift;Sleep;Sit    Examination-Participation Restrictions  Driving;Community Activity;Other;School    Stability/Clinical Decision Making  Evolving/Moderate complexity    Rehab Potential  Good    PT Frequency  1x /  week    PT Duration  6 weeks    PT Treatment/Interventions  ADLs/Self Care Home Management;Cryotherapy;Electrical Stimulation;Ultrasound;Moist Heat;Traction;Therapeutic exercise;Therapeutic activities;Neuromuscular re-education;Manual techniques;Patient/family education;Dry needling;Taping    PT Next Visit Plan  Review new stretches given for HEP an dsee if pt may be ready for 1 more?? Arm bike for endurance.    PT Home Exercise Plan  Access Code: [email or web address]    Consulted and Agree with Plan of Care  Patient       Patient will benefit from skilled therapeutic intervention in order to improve the following deficits and impairments:  Increased fascial restricitons, Decreased range of motion, Increased muscle spasms, Pain, Decreased strength, Postural dysfunction  Visit Diagnosis: Cervicalgia  Muscle weakness (generalized)  Other muscle spasm     Problem List Patient Active Problem List   Diagnosis Date Noted  . Vitamin D deficiency 05/03/2019  . Multiple thyroid nodules 03/08/2019  . Oropharyngeal dysphagia 03/08/2019  . Throat pain 02/14/2019  . Constipation 12/26/2018  . Abdominal pain 12/26/2018  . Brachial neuritis of right upper extremity 07/06/2018  . Well adult exam 03/16/2018  . Obesity 03/16/2018  . Conjunctivitis 07/14/2017  . Foreign body of right ear 12/01/2016  . Sweats, menopausal 10/20/2016  . Panic attacks 10/20/2016  . Loss of transverse plantar arch of right foot 09/07/2016  . Other bursal cyst of wrist 08/11/2016  . Posterior right knee pain 07/16/2016  . Knee pain, right 05/21/2016  . Hamstring strain, sequela 05/21/2016  . Wart viral 05/12/2016  . Contusion of knee 05/05/2016  . Fall involving sidewalk curb 05/05/2016  . Aphasia 03/13/2016  . Shoulder pain, bilateral 12/11/2015  . Fibromyalgia 12/11/2015  . Positional vertigo 11/15/2015  . Post concussion syndrome 11/01/2015  . Clavicle fracture 10/30/2015  . Hair loss 10/11/2015  . Cervical  radicular pain 09/20/2015  . Pain in joint, shoulder region 09/11/2015  . Right wrist pain 08/07/2015  . Splinter in skin 07/30/2015  . Fall from ladder 07/30/2015  . Skull fracture with concussion (HCC) 07/30/2015  . Fracture of cervical vertebra, C6 (HCC) 07/30/2015  . Thoracic vertebral fracture (HCC) 07/30/2015  . Closed fracture of base of fourth metacarpal bone of left hand 06/30/2015  . Laceration of hand, left 06/30/2015  . Abdominal pain, epigastric 05/07/2015  . Scaly patch rash 02/26/2015  . Mitral valve regurgitation 09/27/2014  . PAT (paroxysmal atrial tachycardia) (HCC) 09/06/2014  . Tachycardia 07/31/2014  . Sprain of ankle 06/29/2014  . Earache, left 05/15/2013  . Dizziness 05/15/2013  . Asthmatic bronchitis 05/08/2013  . Benign paroxysmal positional vertigo 05/08/2013  . Chest wall pain 01/26/2013  . LUQ abdominal pain 01/20/2013  . Insomnia 01/10/2013  . Depression (emotion) 01/10/2013  . URI, acute 08/25/2012  . Snoring 08/25/2012  . IBS (irritable bowel syndrome) 02/08/2012  . Multiple pulmonary nodules 02/08/2012  . Chest heaviness 11/16/2010  . GERD 05/24/2010  . SINUSITIS, MAXILLARY, CHRONIC 05/20/2010  . Myalgia and myositis 11/20/2009  . Fatigue 11/20/2009  . ARTHRALGIA 06/26/2009  . Headache 06/26/2009  . TOBACCO USE, QUIT 06/26/2009  . LOW BACK PAIN 02/20/2009  . Rosacea 12/27/2008  . PRURITUS 12/27/2008  . Allergic urticaria 12/27/2008  . B12   deficiency 01/02/2008    COCHRAN,JENNIFER, PTA 05/10/2019, 12:43 PM  [facility] [address], [address] [place], [place], [address] Phone: [phone]   Fax:  [phone]  Name: Regina [name] MRN: [id number] Date of Birth: 12/09/1965   

## 2019-05-17 ENCOUNTER — Encounter: Payer: Self-pay | Admitting: Physical Therapy

## 2019-05-17 ENCOUNTER — Other Ambulatory Visit: Payer: Self-pay

## 2019-05-17 ENCOUNTER — Ambulatory Visit: Payer: BC Managed Care – PPO | Admitting: Physical Therapy

## 2019-05-17 DIAGNOSIS — M62838 Other muscle spasm: Secondary | ICD-10-CM

## 2019-05-17 DIAGNOSIS — M6281 Muscle weakness (generalized): Secondary | ICD-10-CM

## 2019-05-17 DIAGNOSIS — M542 Cervicalgia: Secondary | ICD-10-CM

## 2019-05-17 DIAGNOSIS — G8929 Other chronic pain: Secondary | ICD-10-CM

## 2019-05-17 NOTE — Therapy (Signed)
[facility] [address] [place], [place], [address] Phone: [phone]   Fax:  [phone]  Physical Therapy Treatment  Patient Details  Name: Regina [name] MRN: [id number] Date of Birth: 09/21/1965 Referring Provider (PT): Dr. Alain Marion   Encounter Date: 05/17/2019  PT End of Session - 05/17/19 1236    Visit Number  15    Date for PT Re-Evaluation  06/07/19    Authorization Type  BCBS    Authorization - Visit Number  2    Authorization - Number of Visits  6    PT Start Time  6979    PT Stop Time  1315    PT Time Calculation (min)  44 min    Activity Tolerance  Patient tolerated treatment well    Behavior During Therapy  [facility] for tasks assessed/performed       Past Medical History:  Diagnosis Date  . Allergic urticaria   . ARTHRALGIA   . Arthritis   . Atrial fibrillation ([place])   . B12 DEFICIENCY   . CFS (chronic fatigue syndrome)   . Endometriosis   . Epidural hemorrhage with loss of consciousness ([place]) 06/29/15  . Fibroid   . Fibromyalgia   . Fracture of cervical vertebra, C6 ([place]) 06/29/15  . Gastritis   . GERD   . Heart murmur   . History of endometriosis   . IBS (irritable bowel syndrome)   . Internal hemorrhoids   . MVP (mitral valve prolapse)   . Osteoarthritis   . Rosacea   . Skin cancer of arm, right   . Thoracic compression fracture ([place]) 06/29/15   T2-T6    Past Surgical History:  Procedure Laterality Date  . ABDOMINAL HYSTERECTOMY     partial  . ABDOMINAL HYSTERECTOMY    . ANAL FISSURE REPAIR     Dr Rise [name] 2009  . APPENDECTOMY    . COLPOSCOPY    . HEMORRHOID SURGERY     Dr. Rise [name] 2009  . OVARIAN CYST REMOVAL    . RT BUNIONECTOMT    . TONSILLECTOMY AND ADENOIDECTOMY    . TUBAL LIGATION    . UTERINE FIBROID SURGERY      There were no vitals filed for this visit.  Subjective Assessment - 05/17/19 1238    Subjective  I have been doing some stretching, but my husband  fell Saturday and broke his leg. That has disrupted everything, he has had 2 surgeries.    Pertinent History  broke neck C6, T3-7 in 2017 no hardware;  fracture right shoulder;  dizziness;  memory issues:  states she does better not being overloaded with instructions/ HEP;  states she is not motivated to exercise;  [facility] PT at [facility] ([facility] response to [facility]) and [facility] for mostly massage and limited ex    Currently in Pain?  No/denies    Multiple Pain Sites  No                       [facility] Adult PT Treatment/Exercise - 05/17/19 0001      Neck Exercises: Seated   Other Seated Exercise  Thoracic rotation 5x bil, Trunk sidebend stretching 3x bil, 5 sec holds    Other Seated Exercise  Seated Cat/Camel 5x, added to HEP      Shoulder Exercises: ROM/Strengthening   UBE (Upper Arm Bike)  L 1.3 3x3 with discussion of current status.       Manual Therapy  Allergic urticaria 12/27/2008  . B12 deficiency 01/02/2008    Dekendrick Uzelac, PTA 05/17/2019, 1:16 PM  [facility] [address] [place], [place], [address] Phone: [phone]   Fax:  [phone]  Name: Regina [name] MRN: [id number] Date of Birth: 1966/03/13 Access Code: [facility]  URL: [email or web address]  Date: 05/17/2019  Prepared by: Myrene Galas   Exercises  Supine Chin Tuck - 7 reps - 1 sets  - 3 hold - 2x daily - 7x weekly  Cervical Retraction with Resistance - 10 reps - 1 sets - 1x daily - 7x weekly  Seated Assisted Cervical Rotation with Towel - 10 reps - 1 sets - 1x daily - 7x weekly  Seated Cat Camel - 5 reps - 1 sets - 3x daily - 7x weekly  Allergic urticaria 12/27/2008  . B12 deficiency 01/02/2008    Dekendrick Uzelac, PTA 05/17/2019, 1:16 PM  [facility] [address] [place], [place], [address] Phone: [phone]   Fax:  [phone]  Name: Regina [name] MRN: [id number] Date of Birth: 1966/03/13 Access Code: [facility]  URL: [email or web address]  Date: 05/17/2019  Prepared by: Myrene Galas   Exercises  Supine Chin Tuck - 7 reps - 1 sets  - 3 hold - 2x daily - 7x weekly  Cervical Retraction with Resistance - 10 reps - 1 sets - 1x daily - 7x weekly  Seated Assisted Cervical Rotation with Towel - 10 reps - 1 sets - 1x daily - 7x weekly  Seated Cat Camel - 5 reps - 1 sets - 3x daily - 7x weekly  [facility] [address] [place], [place], [address] Phone: [phone]   Fax:  [phone]  Physical Therapy Treatment  Patient Details  Name: Regina [name] MRN: [id number] Date of Birth: 09/21/1965 Referring Provider (PT): Dr. Alain Marion   Encounter Date: 05/17/2019  PT End of Session - 05/17/19 1236    Visit Number  15    Date for PT Re-Evaluation  06/07/19    Authorization Type  BCBS    Authorization - Visit Number  2    Authorization - Number of Visits  6    PT Start Time  6979    PT Stop Time  1315    PT Time Calculation (min)  44 min    Activity Tolerance  Patient tolerated treatment well    Behavior During Therapy  [facility] for tasks assessed/performed       Past Medical History:  Diagnosis Date  . Allergic urticaria   . ARTHRALGIA   . Arthritis   . Atrial fibrillation ([place])   . B12 DEFICIENCY   . CFS (chronic fatigue syndrome)   . Endometriosis   . Epidural hemorrhage with loss of consciousness ([place]) 06/29/15  . Fibroid   . Fibromyalgia   . Fracture of cervical vertebra, C6 ([place]) 06/29/15  . Gastritis   . GERD   . Heart murmur   . History of endometriosis   . IBS (irritable bowel syndrome)   . Internal hemorrhoids   . MVP (mitral valve prolapse)   . Osteoarthritis   . Rosacea   . Skin cancer of arm, right   . Thoracic compression fracture ([place]) 06/29/15   T2-T6    Past Surgical History:  Procedure Laterality Date  . ABDOMINAL HYSTERECTOMY     partial  . ABDOMINAL HYSTERECTOMY    . ANAL FISSURE REPAIR     Dr Rise [name] 2009  . APPENDECTOMY    . COLPOSCOPY    . HEMORRHOID SURGERY     Dr. Rise [name] 2009  . OVARIAN CYST REMOVAL    . RT BUNIONECTOMT    . TONSILLECTOMY AND ADENOIDECTOMY    . TUBAL LIGATION    . UTERINE FIBROID SURGERY      There were no vitals filed for this visit.  Subjective Assessment - 05/17/19 1238    Subjective  I have been doing some stretching, but my husband  fell Saturday and broke his leg. That has disrupted everything, he has had 2 surgeries.    Pertinent History  broke neck C6, T3-7 in 2017 no hardware;  fracture right shoulder;  dizziness;  memory issues:  states she does better not being overloaded with instructions/ HEP;  states she is not motivated to exercise;  had [facility] PT at [facility] ([facility] response to DN) and [facility] for mostly massage and limited ex    Currently in Pain?  No/denies    Multiple Pain Sites  No                       [facility] Adult PT Treatment/Exercise - 05/17/19 0001      Neck Exercises: Seated   Other Seated Exercise  Thoracic rotation 5x bil, Trunk sidebend stretching 3x bil, 5 sec holds    Other Seated Exercise  Seated Cat/Camel 5x, added to HEP      Shoulder Exercises: ROM/Strengthening   UBE (Upper Arm Bike)  L 1.3 3x3 with discussion of current status.       Manual Therapy

## 2019-05-17 NOTE — Patient Instructions (Signed)

## 2019-05-18 NOTE — Therapy (Signed)
[facility] [address], [place], [place], [address] Phone: [phone]   Fax:  [phone]  Physical Therapy Evaluation  Patient Details  Name: Regina [name] MRN: [id number] Date of Birth: [birth year] Referring Provider (PT): Dr. Alain Marion   Encounter Date: 05/17/2019  PT End of Session - 05/17/19 1344    Visit Number  16   eval low back   Date for PT Re-Evaluation  07/12/19    Authorization Type  BCBS    PT Start Time  3846    PT Stop Time  1359    PT Time Calculation (min)  42 min    Activity Tolerance  Patient tolerated treatment well    Behavior During Therapy  [facility] for tasks assessed/performed       Past Medical History:  Diagnosis Date  . Allergic urticaria   . ARTHRALGIA   . Arthritis   . Atrial fibrillation ([place])   . B12 DEFICIENCY   . CFS (chronic fatigue syndrome)   . Endometriosis   . Epidural hemorrhage with loss of consciousness ([place]) 06/29/15  . Fibroid   . Fibromyalgia   . Fracture of cervical vertebra, C6 ([place]) 06/29/15  . Gastritis   . GERD   . Heart murmur   . History of endometriosis   . IBS (irritable bowel syndrome)   . Internal hemorrhoids   . MVP (mitral valve prolapse)   . Osteoarthritis   . Rosacea   . Skin cancer of arm, right   . Thoracic compression fracture ([place]) 06/29/15   T2-T6    Past Surgical History:  Procedure Laterality Date  . ABDOMINAL HYSTERECTOMY     partial  . ABDOMINAL HYSTERECTOMY    . ANAL FISSURE REPAIR     Dr Rise [name] 2009  . APPENDECTOMY    . COLPOSCOPY    . HEMORRHOID SURGERY     Dr. Rise [name] 2009  . OVARIAN CYST REMOVAL    . RT BUNIONECTOMT    . TONSILLECTOMY AND ADENOIDECTOMY    . TUBAL LIGATION    . UTERINE FIBROID SURGERY      There were no vitals filed for this visit.   Subjective Assessment - 05/17/19 1319    Subjective  My hips are the main problem.  I feel like I get catches from the low back and it radiates to the hips.  I  wake up a lot with arms and shoulders going to sleep and my hips.  When I sit too long it is painful when I stand up    Pertinent History  broke neck C6, T3-7 in 2017 no hardware;  fracture right shoulder;  dizziness;  memory issues:  states she does better not being overloaded with instructions/ HEP;  states she is not motivated to exercise;  [facility] PT at [facility] ([facility] response to [facility]) and [facility] for mostly massage and limited ex    How long can you sit comfortably?  < 1 hour; usually 30 minutes    How long can you stand comfortably?  transition hurts after sitting    Currently in Pain?  Yes    Pain Score  2    ranges 2-6   Pain Location  Back    Pain Orientation  Right;Left   Rt>Lt   Pain Descriptors / Indicators  Sharp    Pain Type  Chronic pain    Pain Radiating Towards  down to the knee    Pain Onset  More than a  month ago    Pain Frequency  Intermittent    Aggravating Factors   staying in one place and then moving after that    Pain Relieving Factors  walking sometimes helps    Effect of Pain on Daily Activities  I am just cautious about everything    Multiple Pain Sites  No         [facility] PT Assessment - 05/18/19 0001      Assessment   Medical Diagnosis  M54.41,G89.29 (ICD-10-CM) - Chronic right-sided low back pain with right-sided sciatica;M25.511 (ICD-10-CM) - Pain in joint of right shoulder;G44.329 (ICD-10-CM) - Chronic post-traumatic headache, not intractable;E53.8 (ICD-10-CM) - B12 deficiency    Referring Provider (PT)  Dr. Alain Marion      Precautions   Precautions  None      Restrictions   Weight Bearing Restrictions  No      Balance Screen   Has the patient fallen in the past 6 months  No      Home Environment   Living Environment  Private residence      Prior Function   Vocation Requirements  sitting/desk work      Cognition   Overall Cognitive Status  Within Functional Limits for tasks assessed      Observation/Other Assessments   Focus  on Therapeutic Outcomes (FOTO)   39% limittaion      ROM / Strength   AROM / PROM / Strength  PROM;Strength;AROM      AROM   Overall AROM Comments  lumbar flexion 50%    Cervical Flexion  --    Cervical Extension  --    Cervical - Right Side Bend  --    Cervical - Left Side Bend  --    Cervical - Right Rotation  --    Cervical - Left Rotation  --      Strength   Overall Strength Comments  hip flexion 4/5 bilat +pain; abduction 4-/5 Rt 4/5 Lt    Cervical Flexion  --    Cervical Extension  --      Flexibility   Soft Tissue Assessment /Muscle Length  yes    Hamstrings  50%      Palpation   Palpation comment  gluteals tight and TTP Rt>Lt; IT band and lateral quads tight and TTP      Special Tests    Special Tests  Lumbar    Lumbar Tests  Straight Leg Raise      Straight Leg Raise   Findings  Positive    Side   --   bilateral   Comment  bilateral but did not increase with ankle DF or cervical flexion on Lt side      Transfers   Five time sit to stand comments   21 sec; did not use UE support; weight shift more to the left      Ambulation/Gait   Gait Pattern  Decreased stride length   guarded               Objective measurements completed on examination: See above findings.                PT Short Term Goals - 04/26/19 1506      PT SHORT TERM GOAL #1   Title  independent with initial HEP    Time  4    Period  Weeks    Status  Partially Met   Does semi often     PT SHORT TERM  GOAL #2   Title  The patient will report a 25% improvement in sleep    Time  4    Period  Weeks    Status  On-going   20%     PT SHORT TERM GOAL #3   Title  Improved cervical rotation to 40 degrees and improved sidebending to 30 degrees bilaterally needed for driving    Time  4    Period  Weeks    Status  Achieved        PT Long Term Goals - 05/18/19 1243      PT LONG TERM GOAL #1   Title  independent with HEP and understand how to progress herself     Time  8    Period  Weeks    Status  On-going    Target Date  07/12/19      PT LONG TERM GOAL #2   Title  The 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    Time  8    Period  Weeks    Status  On-going    Target Date  07/12/19      PT LONG TERM GOAL #3   Title  The patient will have improved cervical flexion and extension to 55 degrees and rotation to 45 degrees 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)    Time  8    Status  Achieved      PT LONG TERM GOAL #4   Title  increased cervical and periscapular strength to grossly 4/5 needed for maintaining upright posture and peforming home and work ADLS    Time  8    Period  Weeks    Status  On-going    Target Date  07/12/19      PT LONG TERM GOAL #5   Title  FOTO score </= 33% limitation    Time  8    Period  Weeks    Status  On-going    Target Date  07/12/19      Additional Long Term Goals   Additional Long Term Goals  Yes      PT LONG TERM GOAL #6   Title  perform sit to stand in < 12 sec to demonstrate improved LE strength and reduced risk of falls    Time  8    Period  Weeks    Status  New    Target Date  07/12/19      PT LONG TERM GOAL #7   Title  report a 75% reduction of back and hip pain in the morning and getting up from seated position    Time  8    Period  Weeks    Status  New    Target Date  07/12/19      PT LONG TERM GOAL #8   Title  Pt will be able to understand explain at least two ways she can manage her low back pain at home.    Time  8    Period  Weeks    Status  New    Target Date  07/12/19             Plan - 05/18/19 1236    Clinical Impression Statement  Pt presents to clinic today for evaluation of low back and hip pain that has been added to her original PT orders.  Pt has chronic low back and hip pain that she reports  has recently been worse especially after sleeping and sitting long periods.  Pt demonstrates positive SLR test bilat with  worse symptoms on the Rt side.  Pt has very tight and TTP gluteals bilateral.  Pt has weak and painful hip flexion MMT.  Pt has lumbar lordosis.  5 x sit to stand is above age releated norms and takes 21 sec to complete demonstrating overall LE weakness and can indicate increased fall risk.  Pt will benefit from overall LE and core strenthening and to address these impairments along with those previously found at evaluation for the neck and upper extremities.  PT has added goals and is recommended extended time to cover all body regions or 2 sessions/week where able.    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; OA; fibromyalgia; lack of motivation    Examination-Participation Restrictions  Driving;Community Activity;Other;School    Stability/Clinical Decision Making  Evolving/Moderate complexity    Clinical Decision Making  Moderate    Rehab Potential  Good    PT Frequency  2x / week    PT Duration  8 weeks    PT Treatment/Interventions  ADLs/Self Care Home Management;Cryotherapy;Electrical Stimulation;Ultrasound;Moist Heat;Traction;Therapeutic exercise;Therapeutic activities;Neuromuscular re-education;Manual techniques;Patient/family education;Dry needling;Taping    PT Next Visit Plan  Review Cat Camel, arm bike increase resistance. Add shoulder press to HEP when pt wakes up in the AM; hip and lumbar stretches; hip flexor and core strength    PT Home Exercise Plan  Access Code: [facility]    Consulted and Agree with Plan of Care  Patient       Patient will benefit from skilled therapeutic intervention in order to improve the following deficits and impairments:  Increased fascial restricitons, Decreased range of motion, Increased muscle spasms, Pain, Decreased strength, Postural  dysfunction  Visit Diagnosis: Cervicalgia  Muscle weakness (generalized)  Other muscle spasm  Chronic bilateral low back pain, unspecified whether sciatica present     Problem List Patient Active Problem List   Diagnosis Date Noted  . Vitamin D deficiency 05/03/2019  . Multiple thyroid nodules 03/08/2019  . Oropharyngeal dysphagia 03/08/2019  . Throat pain 02/14/2019  . Constipation 12/26/2018  . Abdominal pain 12/26/2018  . Brachial neuritis of right upper extremity 07/06/2018  . Well adult exam 03/16/2018  . Obesity 03/16/2018  . Conjunctivitis 07/14/2017  . Foreign body of right ear 12/01/2016  . Sweats, menopausal 10/20/2016  . Panic attacks 10/20/2016  . Loss of transverse plantar arch of right foot 09/07/2016  . Other bursal cyst of wrist 08/11/2016  . Posterior right knee pain 07/16/2016  . Knee pain, right 05/21/2016  . Hamstring strain, sequela 05/21/2016  . Wart viral 05/12/2016  . Contusion of knee 05/05/2016  . Fall involving sidewalk curb 05/05/2016  . Aphasia 03/13/2016  . Shoulder pain, bilateral 12/11/2015  . Fibromyalgia 12/11/2015  . Positional vertigo 11/15/2015  . Post concussion syndrome 11/01/2015  . Clavicle fracture 10/30/2015  . Hair loss 10/11/2015  . Cervical radicular pain 09/20/2015  . Pain in joint, shoulder region 09/11/2015  . Right wrist pain 08/07/2015  . Splinter in skin 07/30/2015  . Fall from ladder 07/30/2015  . Skull fracture with concussion ([place]) 07/30/2015  . Fracture of cervical vertebra, C6 ([place]) 07/30/2015  . Thoracic vertebral fracture ([place]) 07/30/2015  . Closed fracture of base of fourth metacarpal bone of left hand 06/30/2015  . Laceration  of hand, left 06/30/2015  . Abdominal pain, epigastric 05/07/2015  . Scaly patch rash 02/26/2015  . Mitral valve regurgitation 09/27/2014  . PAT (paroxysmal atrial tachycardia) ([place]) 09/06/2014  . Tachycardia 07/31/2014  . Sprain of ankle 06/29/2014  . Earache, left  05/15/2013  . Dizziness 05/15/2013  . Asthmatic bronchitis 05/08/2013  . Benign paroxysmal positional vertigo 05/08/2013  . Chest wall pain 01/26/2013  . LUQ abdominal pain 01/20/2013  . Insomnia 01/10/2013  . Depression (emotion) 01/10/2013  . URI, acute 08/25/2012  . Snoring 08/25/2012  . IBS (irritable bowel syndrome) 02/08/2012  . Multiple pulmonary nodules 02/08/2012  . Chest heaviness 11/16/2010  . GERD 05/24/2010  . SINUSITIS, MAXILLARY, CHRONIC 05/20/2010  . Myalgia and myositis 11/20/2009  . Fatigue 11/20/2009  . ARTHRALGIA 06/26/2009  . Headache 06/26/2009  . TOBACCO USE, QUIT 06/26/2009  . LOW BACK PAIN 02/20/2009  . Rosacea 12/27/2008  . PRURITUS 12/27/2008  . Allergic urticaria 12/27/2008  . B12 deficiency 01/02/2008    Jule Ser, PT 05/18/2019, 1:00 PM  [facility] [address] [place], [place], [address] Phone: [phone]   Fax:  [phone]  Name: Regina [name] MRN: [id number] Date of Birth: March 13, 1966

## 2019-05-24 ENCOUNTER — Ambulatory Visit: Payer: BC Managed Care – PPO | Admitting: Physical Therapy

## 2019-05-24 ENCOUNTER — Encounter: Payer: Self-pay | Admitting: Physical Therapy

## 2019-05-24 ENCOUNTER — Other Ambulatory Visit: Payer: Self-pay

## 2019-05-24 DIAGNOSIS — M6281 Muscle weakness (generalized): Secondary | ICD-10-CM

## 2019-05-24 DIAGNOSIS — M542 Cervicalgia: Secondary | ICD-10-CM

## 2019-05-24 DIAGNOSIS — G8929 Other chronic pain: Secondary | ICD-10-CM

## 2019-05-24 DIAGNOSIS — M62838 Other muscle spasm: Secondary | ICD-10-CM

## 2019-05-24 NOTE — Therapy (Signed)
[facility] [address], [address] [place], [place], [address] Phone: [phone]   Fax:  [phone]  Physical Therapy Treatment  Patient Details  Name: Regina [name] MRN: [id number] Date of Birth: 1965-05-24 Referring Provider (PT): Dr. Posey Rea   Encounter Date: 05/24/2019  PT End of Session - 05/24/19 0809    Visit Number  17    Date for PT Re-Evaluation  07/12/19    Authorization Type  BCBS    Authorization - Visit Number  --    Authorization - Number of Visits  6    PT Start Time  0808    PT Stop Time  0846    PT Time Calculation (min)  38 min    Activity Tolerance  Patient tolerated treatment well    Behavior During Therapy  [facility] for tasks assessed/performed       Past Medical History:  Diagnosis Date  . Allergic urticaria   . ARTHRALGIA   . Arthritis   . Atrial fibrillation (HCC)   . B12 DEFICIENCY   . CFS (chronic fatigue syndrome)   . Endometriosis   . Epidural hemorrhage with loss of consciousness (HCC) 06/29/15  . Fibroid   . Fibromyalgia   . Fracture of cervical vertebra, C6 (HCC) 06/29/15  . Gastritis   . GERD   . Heart murmur   . History of endometriosis   . IBS (irritable bowel syndrome)   . Internal hemorrhoids   . MVP (mitral valve prolapse)   . Osteoarthritis   . Rosacea   . Skin cancer of arm, right   . Thoracic compression fracture (HCC) 06/29/15   T2-T6    Past Surgical History:  Procedure Laterality Date  . ABDOMINAL HYSTERECTOMY     partial  . ABDOMINAL HYSTERECTOMY    . ANAL FISSURE REPAIR     Dr Zachery Dakins 2009  . APPENDECTOMY    . COLPOSCOPY    . HEMORRHOID SURGERY     Dr. Zachery Dakins 2009  . OVARIAN CYST REMOVAL    . RT BUNIONECTOMT    . TONSILLECTOMY AND ADENOIDECTOMY    . TUBAL LIGATION    . UTERINE FIBROID SURGERY      There were no vitals filed for this visit.  Subjective Assessment - 05/24/19 0811    Subjective  I am just exhausted taking care of my husband. My  muscles feel tight this AM including a "catch" in teh back of my hip.    Pertinent History  broke neck C6, T3-7 in 2017 no hardware;  fracture right shoulder;  dizziness;  memory issues:  states she does better not being overloaded with instructions/ HEP;  states she is not motivated to exercise;  [facility] ([facility]) and [facility] for mostly massage and limited ex    Currently in Pain?  Yes    Pain Score  2     Pain Location  --   Post RT hip/pelvis and Lt upper trap   Pain Descriptors / Indicators  Sore;Tightness                       [facility] Adult PT Treatment/Exercise - 05/24/19 0001      Lumbar Exercises: Stretches   Lower Trunk Rotation  --   to the LT 20 sec with PTA overpressure to depress pelvis   Pelvic Tilt  3 reps;10 reps      Knee/Hip Exercises: Stretches   Active  Hamstring Stretch  Right;2 reps;20 seconds    Piriformis Stretch  Right;3 reps;20 seconds    Other Knee/Hip Stretches  Knee to chest stetch RTLE 3x 20 sec      Knee/Hip Exercises: Aerobic   Nustep  L2 x 6 min with discussion of status      Manual Therapy   Soft tissue mobilization  Addaday assit to Rt hip post/lateral.             PT Education - 05/24/19 0844    Education Details  HEP    Person(s) Educated  Patient    Methods  Explanation;Demonstration;Tactile cues;Verbal cues;Handout    Comprehension  Returned demonstration;Verbalized understanding       PT Short Term Goals - 04/26/19 1506      PT SHORT TERM GOAL #1   Title  independent with initial HEP    Time  4    Period  Weeks    Status  Partially Met   Does semi often     PT SHORT TERM GOAL #2   Title  The patient will report a 25% improvement in sleep    Time  4    Period  Weeks    Status  On-going   20%     PT SHORT TERM GOAL #3   Title  Improved cervical rotation to 40 degrees and improved sidebending to 30 degrees bilaterally needed for driving    Time  4    Period  Weeks     Status  Achieved        PT Long Term Goals - 05/18/19 1243      PT LONG TERM GOAL #1   Title  independent with HEP and understand how to progress herself    Time  8    Period  Weeks    Status  On-going    Target Date  07/12/19      PT LONG TERM GOAL #2   Title  The 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    Time  8    Period  Weeks    Status  On-going    Target Date  07/12/19      PT LONG TERM GOAL #3   Title  The patient will have improved cervical flexion and extension to 55 degrees and rotation to 45 degrees 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)    Time  8    Status  Achieved      PT LONG TERM GOAL #4   Title  increased cervical and periscapular strength to grossly 4/5 needed for maintaining upright posture and peforming home and work ADLS    Time  8    Period  Weeks    Status  On-going    Target Date  07/12/19      PT LONG TERM GOAL #5   Title  FOTO score </= 33% limitation    Time  8    Period  Weeks    Status  On-going    Target Date  07/12/19      Additional Long Term Goals   Additional Long Term Goals  Yes      PT LONG TERM GOAL #6   Title  perform sit to stand in < 12 sec to demonstrate improved LE strength and reduced risk of falls    Time  8    Period  Weeks    Status  New  Target Date  07/12/19      PT LONG TERM GOAL #7   Title  report a 75% reduction of back and hip pain in the morning and getting up from seated position    Time  8    Period  Weeks    Status  New    Target Date  07/12/19      PT LONG TERM GOAL #8   Title  Pt will be able to understand explain at least two ways she can manage her low back pain at home.    Time  8    Period  Weeks    Status  New    Target Date  07/12/19            Plan - 05/24/19 0810    Clinical Impression Statement  Initiated RT hip stretches which pt was agreeable to adding to her HEP. She has done well incorporating her trunk AROM  exercises into her day increasing her compliance. She is currently dealing with stressful event at home ( husband recovering from multiple leg surgeries) which is complicating her situation. Pt felt soft tissue work to posterolateral RT hip was effective.    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; OA; fibromyalgia; lack of motivation    Examination-Activity Limitations  Lift;Sleep;Sit    Examination-Participation Restrictions  Driving;Community Activity;Other;School    Stability/Clinical Decision Making  Evolving/Moderate complexity    Rehab Potential  Good    PT Frequency  2x / week    PT Duration  8 weeks    PT Treatment/Interventions  ADLs/Self Care Home Management;Cryotherapy;Electrical Stimulation;Ultrasound;Moist Heat;Traction;Therapeutic exercise;Therapeutic activities;Neuromuscular re-education;Manual techniques;Patient/family education;Dry needling;Taping    PT Next Visit Plan  Nustep, review hip and lumbar stretches, begin gentle strength to core and hip flexors. Addaday at end of session to RT hip/back.    PT Home Exercise Plan  Access Code: [email or web address]    Consulted and Agree with Plan of Care  Patient       Patient will benefit from skilled therapeutic intervention in order to improve the following deficits and impairments:  Increased fascial restricitons, Decreased range of motion, Increased muscle spasms, Pain, Decreased strength, Postural dysfunction  Visit Diagnosis: Cervicalgia  Muscle weakness (generalized)  Other muscle spasm  Chronic bilateral low back pain, unspecified whether sciatica present     Problem List Patient Active Problem List   Diagnosis Date Noted  . Vitamin D deficiency 05/03/2019  . Multiple thyroid nodules 03/08/2019  .  Oropharyngeal dysphagia 03/08/2019  . Throat pain 02/14/2019  . Constipation 12/26/2018  . Abdominal pain 12/26/2018  . Brachial neuritis of right upper extremity 07/06/2018  . Well adult exam 03/16/2018  . Obesity 03/16/2018  . Conjunctivitis 07/14/2017  . Foreign body of right ear 12/01/2016  . Sweats, menopausal 10/20/2016  . Panic attacks 10/20/2016  . Loss of transverse plantar arch of right foot 09/07/2016  . Other bursal cyst of wrist 08/11/2016  . Posterior right knee pain 07/16/2016  . Knee pain, right 05/21/2016  . Hamstring strain, sequela 05/21/2016  . Wart viral 05/12/2016  . Contusion of knee 05/05/2016  . Fall involving sidewalk curb 05/05/2016  . Aphasia 03/13/2016  . Shoulder pain, bilateral 12/11/2015  . Fibromyalgia 12/11/2015  . Positional vertigo 11/15/2015  . Post concussion syndrome 11/01/2015  . Clavicle fracture 10/30/2015  . Hair loss 10/11/2015  .  Cervical radicular pain 09/20/2015  . Pain in joint, shoulder region 09/11/2015  . Right wrist pain 08/07/2015  . Splinter in skin 07/30/2015  . Fall from ladder 07/30/2015  . Skull fracture with concussion (HCC) 07/30/2015  . Fracture of cervical vertebra, C6 (HCC) 07/30/2015  . Thoracic vertebral fracture (HCC) 07/30/2015  . Closed fracture of base of fourth metacarpal bone of left hand 06/30/2015  . Laceration of hand, left 06/30/2015  . Abdominal pain, epigastric 05/07/2015  . Scaly patch rash 02/26/2015  . Mitral valve regurgitation 09/27/2014  . PAT (paroxysmal atrial tachycardia) (HCC) 09/06/2014  . Tachycardia 07/31/2014  . Sprain of ankle 06/29/2014  . Earache, left 05/15/2013  . Dizziness 05/15/2013  . Asthmatic bronchitis 05/08/2013  . Benign paroxysmal positional vertigo 05/08/2013  . Chest wall pain 01/26/2013  . LUQ abdominal pain 01/20/2013  . Insomnia 01/10/2013  . Depression (emotion) 01/10/2013  . URI, acute 08/25/2012  . Snoring 08/25/2012  . IBS (irritable bowel syndrome)  02/08/2012  . Multiple pulmonary nodules 02/08/2012  . Chest heaviness 11/16/2010  . GERD 05/24/2010  . SINUSITIS, MAXILLARY, CHRONIC 05/20/2010  . Myalgia and myositis 11/20/2009  . Fatigue 11/20/2009  . ARTHRALGIA 06/26/2009  . Headache 06/26/2009  . TOBACCO USE, QUIT 06/26/2009  . LOW BACK PAIN 02/20/2009  . Rosacea 12/27/2008  . PRURITUS 12/27/2008  . Allergic urticaria 12/27/2008  . B12 deficiency 01/02/2008    Florance Paolillo, PTA 05/24/2019, 2:00 PM  [facility] [address], [address] [place], [place], [address] Phone: [phone]   Fax:  [phone]  Name: Regina [name] MRN: [id number] Date of Birth: 1966/01/16

## 2019-05-31 ENCOUNTER — Encounter: Payer: Self-pay | Admitting: Physical Therapy

## 2019-05-31 ENCOUNTER — Ambulatory Visit: Payer: BC Managed Care – PPO | Attending: Internal Medicine | Admitting: Physical Therapy

## 2019-05-31 ENCOUNTER — Other Ambulatory Visit: Payer: Self-pay

## 2019-05-31 DIAGNOSIS — M62838 Other muscle spasm: Secondary | ICD-10-CM | POA: Insufficient documentation

## 2019-05-31 DIAGNOSIS — M545 Low back pain: Secondary | ICD-10-CM | POA: Diagnosis present

## 2019-05-31 DIAGNOSIS — G8929 Other chronic pain: Secondary | ICD-10-CM | POA: Insufficient documentation

## 2019-05-31 DIAGNOSIS — M6281 Muscle weakness (generalized): Secondary | ICD-10-CM | POA: Insufficient documentation

## 2019-05-31 DIAGNOSIS — M542 Cervicalgia: Secondary | ICD-10-CM | POA: Insufficient documentation

## 2019-05-31 NOTE — Therapy (Signed)
[facility] [address], [address] [place], [place], [address] Phone: [phone]   Fax:  [phone]  Physical Therapy Treatment  Patient Details  Name: Regina [name] MRN: [id number] Date of Birth: June 02, 1965 Referring Provider (PT): Dr. Posey Rea   Encounter Date: 05/31/2019  PT End of Session - 05/31/19 0816    Visit Number  18    Date for PT Re-Evaluation  07/12/19    Authorization Type  BCBS    Authorization - Visit Number  3    Authorization - Number of Visits  6    PT Start Time  0815   15 min late   PT Stop Time  0920    PT Time Calculation (min)  65 min    Activity Tolerance  Patient tolerated treatment well    Behavior During Therapy  [facility] for tasks assessed/performed       Past Medical History:  Diagnosis Date  . Allergic urticaria   . ARTHRALGIA   . Arthritis   . Atrial fibrillation (HCC)   . B12 DEFICIENCY   . CFS (chronic fatigue syndrome)   . Endometriosis   . Epidural hemorrhage with loss of consciousness (HCC) 06/29/15  . Fibroid   . Fibromyalgia   . Fracture of cervical vertebra, C6 (HCC) 06/29/15  . Gastritis   . GERD   . Heart murmur   . History of endometriosis   . IBS (irritable bowel syndrome)   . Internal hemorrhoids   . MVP (mitral valve prolapse)   . Osteoarthritis   . Rosacea   . Skin cancer of arm, right   . Thoracic compression fracture (HCC) 06/29/15   T2-T6    Past Surgical History:  Procedure Laterality Date  . ABDOMINAL HYSTERECTOMY     partial  . ABDOMINAL HYSTERECTOMY    . ANAL FISSURE REPAIR     Dr Zachery Dakins 2009  . APPENDECTOMY    . COLPOSCOPY    . HEMORRHOID SURGERY     Dr. Zachery Dakins 2009  . OVARIAN CYST REMOVAL    . RT BUNIONECTOMT    . TONSILLECTOMY AND ADENOIDECTOMY    . TUBAL LIGATION    . UTERINE FIBROID SURGERY      There were no vitals filed for this visit.  Subjective Assessment - 05/31/19 0817    Subjective  Cervical pain is 60% improved, no bad  spasms. Back/hip has been worse since last session. Pt is waking up more at night and isn't sure why exactly. Pt is exhausted by "life" and is not doing much HEP.    Pertinent History  broke neck C6, T3-7 in 2017 no hardware;  fracture right shoulder;  dizziness;  memory issues:  states she does better not being overloaded with instructions/ HEP;  states she is not motivated to exercise;  [facility] at [facility] ([facility] to [facility]) and [facility] for mostly massage and limited ex    Currently in Pain?  Yes   Neck feels tight, RT back/hip 2/10   Pain Descriptors / Indicators  Sore    Aggravating Factors   Not sure    Pain Relieving Factors  when I am not stressed.                       [facility] Adult PT Treatment/Exercise - 05/31/19 0001      Self-Care   Self-Care  Posture    Posture  itting cushion to alieviate tailbone pain.  Lumbar Exercises: Stretches   Single Knee to Chest Stretch  Right;3 reps;10 seconds    Single Knee to Chest Stretch Limitations  Then knee to opposite shoulder Bil 3x10 sec    Pelvic Tilt  10 reps;5 seconds      Knee/Hip Exercises: Supine   Other Supine Knee/Hip Exercises  Hip clamshells RT       Manual Therapy   Soft tissue mobilization  Addaday assit to Rt hip post/lateral, along the lateral RT sacral boarder and pirformis   Gluteal compressions in sidelying, cervical work RT >LT              PT Short Term Goals - 04/26/19 1506      PT SHORT TERM GOAL #1   Title  independent with initial HEP    Time  4    Period  Weeks    Status  Partially Met   Does semi often     PT SHORT TERM GOAL #2   Title  The patient will report a 25% improvement in sleep    Time  4    Period  Weeks    Status  On-going   20%     PT SHORT TERM GOAL #3   Title  Improved cervical rotation to 40 degrees and improved sidebending to 30 degrees bilaterally needed for driving    Time  4    Period  Weeks    Status  Achieved        PT  Long Term Goals - 05/18/19 1243      PT LONG TERM GOAL #1   Title  independent with HEP and understand how to progress herself    Time  8    Period  Weeks    Status  On-going    Target Date  07/12/19      PT LONG TERM GOAL #2   Title  The 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    Time  8    Period  Weeks    Status  On-going    Target Date  07/12/19      PT LONG TERM GOAL #3   Title  The patient will have improved cervical flexion and extension to 55 degrees and rotation to 45 degrees 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)    Time  8    Status  Achieved      PT LONG TERM GOAL #4   Title  increased cervical and periscapular strength to grossly 4/5 needed for maintaining upright posture and peforming home and work ADLS    Time  8    Period  Weeks    Status  On-going    Target Date  07/12/19      PT LONG TERM GOAL #5   Title  FOTO score </= 33% limitation    Time  8    Period  Weeks    Status  On-going    Target Date  07/12/19      Additional Long Term Goals   Additional Long Term Goals  Yes      PT LONG TERM GOAL #6   Title  perform sit to stand in < 12 sec to demonstrate improved LE strength and reduced risk of falls    Time  8    Period  Weeks    Status  New    Target Date  07/12/19      PT LONG  TERM GOAL #7   Title  report a 75% reduction of back and hip pain in the morning and getting up from seated position    Time  8    Period  Weeks    Status  New    Target Date  07/12/19      PT LONG TERM GOAL #8   Title  Pt will be able to understand explain at least two ways she can manage her low back pain at home.    Time  8    Period  Weeks    Status  New    Target Date  07/12/19            Plan - 05/31/19 0816    Clinical Impression Statement  Pt reports her cervical pain is 60% improved since her initial eval and today it is only "tight.: This tightness she attributes to the difficulty of taking  care of her husband and keeping up with demands of school. Pt has not been compliant with her hip and back initial stretches. 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 Pt verbally understood pupose and where to buy. Pt's Rt gluteal group very dense/thick and tender. This improved with soft tissue mobilization and gluteal compressions. Cervical muscles also responded well to manual soft tissue work. Additional  HEP was not given today. She was encouraged to find ways to get more daily stretching in.    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; OA; fibromyalgia; lack of motivation    Examination-Activity Limitations  Lift;Sleep;Sit    Examination-Participation Restrictions  Driving;Community Activity;Other;School    Stability/Clinical Decision Making  Evolving/Moderate complexity    Rehab Potential  Good    PT Frequency  2x / week    PT Duration  8 weeks    PT Treatment/Interventions  ADLs/Self Care Home Management;Cryotherapy;Electrical Stimulation;Ultrasound;Moist Heat;Traction;Therapeutic exercise;Therapeutic activities;Neuromuscular re-education;Manual techniques;Patient/family education;Dry needling;Taping    PT Next Visit Plan  Nustep, review hip and lumbar stretches, begin gentle strength to core and hip flexors. Addaday at end of session to RT hip/back.    PT Home Exercise Plan  Access Code: [email or web address]    Consulted and Agree with Plan of Care  Patient       Patient will benefit from skilled therapeutic intervention in order to improve the following deficits and impairments:  Increased fascial restricitons, Decreased range of motion, Increased muscle spasms, Pain, Decreased strength, Postural  dysfunction  Visit Diagnosis: Cervicalgia  Muscle weakness (generalized)  Other muscle spasm  Chronic bilateral low back pain, unspecified whether sciatica present     Problem List Patient Active Problem List   Diagnosis Date Noted  . Vitamin D deficiency 05/03/2019  . Multiple thyroid nodules 03/08/2019  . Oropharyngeal dysphagia 03/08/2019  . Throat pain 02/14/2019  . Constipation 12/26/2018  . Abdominal pain 12/26/2018  . Brachial neuritis of right upper extremity 07/06/2018  . Well adult exam 03/16/2018  . Obesity 03/16/2018  . Conjunctivitis 07/14/2017  . Foreign body of right ear 12/01/2016  . Sweats, menopausal 10/20/2016  . Panic attacks 10/20/2016  . Loss of transverse plantar arch of right foot 09/07/2016  . Other bursal cyst of wrist 08/11/2016  . Posterior right knee pain 07/16/2016  . Knee pain, right 05/21/2016  . Hamstring strain, sequela  05/21/2016  . Wart viral 05/12/2016  . Contusion of knee 05/05/2016  . Fall involving sidewalk curb 05/05/2016  . Aphasia 03/13/2016  . Shoulder pain, bilateral 12/11/2015  . Fibromyalgia 12/11/2015  . Positional vertigo 11/15/2015  . Post concussion syndrome 11/01/2015  . Clavicle fracture 10/30/2015  . Hair loss 10/11/2015  . Cervical radicular pain 09/20/2015  . Pain in joint, shoulder region 09/11/2015  . Right wrist pain 08/07/2015  . Splinter in skin 07/30/2015  . Fall from ladder 07/30/2015  . Skull fracture with concussion (HCC) 07/30/2015  . Fracture of cervical vertebra, C6 (HCC) 07/30/2015  . Thoracic vertebral fracture (HCC) 07/30/2015  . Closed fracture of base of fourth metacarpal bone of left hand 06/30/2015  . Laceration of hand, left 06/30/2015  . Abdominal pain, epigastric 05/07/2015  . Scaly patch rash 02/26/2015  . Mitral valve regurgitation 09/27/2014  . PAT (paroxysmal atrial tachycardia) (HCC) 09/06/2014  . Tachycardia 07/31/2014  . Sprain of ankle 06/29/2014  . Earache, left  05/15/2013  . Dizziness 05/15/2013  . Asthmatic bronchitis 05/08/2013  . Benign paroxysmal positional vertigo 05/08/2013  . Chest wall pain 01/26/2013  . LUQ abdominal pain 01/20/2013  . Insomnia 01/10/2013  . Depression (emotion) 01/10/2013  . URI, acute 08/25/2012  . Snoring 08/25/2012  . IBS (irritable bowel syndrome) 02/08/2012  . Multiple pulmonary nodules 02/08/2012  . Chest heaviness 11/16/2010  . GERD 05/24/2010  . SINUSITIS, MAXILLARY, CHRONIC 05/20/2010  . Myalgia and myositis 11/20/2009  . Fatigue 11/20/2009  . ARTHRALGIA 06/26/2009  . Headache 06/26/2009  . TOBACCO USE, QUIT 06/26/2009  . LOW BACK PAIN 02/20/2009  . Rosacea 12/27/2008  . PRURITUS 12/27/2008  . Allergic urticaria 12/27/2008  . B12 deficiency 01/02/2008    Robertson Colclough, PTA 05/31/2019, 9:27 AM  [facility] [address] [place], [place], [address] Phone: [phone]   Fax:  [phone]  Name: Regina [name] MRN: [id number] Date of Birth: January 10, 1966

## 2019-06-07 ENCOUNTER — Encounter: Payer: Self-pay | Admitting: Physical Therapy

## 2019-06-07 ENCOUNTER — Ambulatory Visit: Payer: BC Managed Care – PPO | Admitting: Physical Therapy

## 2019-06-07 ENCOUNTER — Other Ambulatory Visit: Payer: Self-pay

## 2019-06-07 DIAGNOSIS — G8929 Other chronic pain: Secondary | ICD-10-CM

## 2019-06-07 DIAGNOSIS — M62838 Other muscle spasm: Secondary | ICD-10-CM

## 2019-06-07 DIAGNOSIS — M545 Low back pain, unspecified: Secondary | ICD-10-CM

## 2019-06-07 DIAGNOSIS — M6281 Muscle weakness (generalized): Secondary | ICD-10-CM

## 2019-06-07 DIAGNOSIS — M542 Cervicalgia: Secondary | ICD-10-CM

## 2019-06-07 NOTE — Therapy (Signed)
[facility] [address] [place], [place], [address] Phone: [phone]   Fax:  [phone]  Physical Therapy Treatment  Patient Details  Name: Regina [name] MRN: [id number] Date of Birth: [birth year] Referring Provider (PT): Dr. Posey Rea   Encounter Date: 06/07/2019  PT End of Session - 06/07/19 0819    Visit Number  19    Date for PT Re-Evaluation  07/12/19    Authorization Type  BCBS    Authorization - Visit Number  4    Authorization - Number of Visits  6    PT Start Time  0813   Pt late   PT Stop Time  0926    PT Time Calculation (min)  73 min    Activity Tolerance  Patient tolerated treatment well    Behavior During Therapy  [facility] for tasks assessed/performed       Past Medical History:  Diagnosis Date  . Allergic urticaria   . ARTHRALGIA   . Arthritis   . Atrial fibrillation (HCC)   . B12 DEFICIENCY   . CFS (chronic fatigue syndrome)   . Endometriosis   . Epidural hemorrhage with loss of consciousness (HCC) 06/29/15  . Fibroid   . Fibromyalgia   . Fracture of cervical vertebra, C6 (HCC) 06/29/15  . Gastritis   . GERD   . Heart murmur   . History of endometriosis   . IBS (irritable bowel syndrome)   . Internal hemorrhoids   . MVP (mitral valve prolapse)   . Osteoarthritis   . Rosacea   . Skin cancer of arm, right   . Thoracic compression fracture (HCC) 06/29/15   T2-T6    Past Surgical History:  Procedure Laterality Date  . ABDOMINAL HYSTERECTOMY     partial  . ABDOMINAL HYSTERECTOMY    . ANAL FISSURE REPAIR     Dr Zachery Dakins 2009  . APPENDECTOMY    . COLPOSCOPY    . HEMORRHOID SURGERY     Dr. Zachery Dakins 2009  . OVARIAN CYST REMOVAL    . RT BUNIONECTOMT    . TONSILLECTOMY AND ADENOIDECTOMY    . TUBAL LIGATION    . UTERINE FIBROID SURGERY      There were no vitals filed for this visit.  Subjective Assessment - 06/07/19 0820    Subjective  Stretching helps me. 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  No/denies                       [facility] Adult PT Treatment/Exercise - 06/07/19 0001      Lumbar Exercises: Stretches   Active Hamstring Stretch  Right;Left;3 reps;20 seconds    Single Knee to Chest Stretch  --   Cross over stretch & butterfly alternating 10 sec Bil 2x   Single Knee to Chest Stretch Limitations  VC to post pelvic tilt a little to avoid excesisve lordosis    Other Lumbar Stretch Exercise  Seated Cat Camel 6x    Other Lumbar Stretch Exercise  Seated side bend stetch 2x Bil 10 sec  Knee/Hip Exercises: Standing   Hip Abduction  Stengthening;Both;1 set;10 reps;Knee straight      Knee/Hip Exercises: Supine   Other Supine Knee/Hip Exercises  Hip clamshells red 2x10, added to HEP issued pt band      Shoulder Exercises: ROM/Strengthening   UBE (Upper Arm Bike)  L1.5 3x3 with PTA present      Manual Therapy   Soft tissue mobilization  Addaday assit to Rt hip post/lateral, along the lateral RT sacral boarder and pirformis   Gluteal compressions in sidelying, cervical work RT >LT            PT Education - 06/07/19 0939    Education Details  Red band supine clamshell and standing hip abduction for HEP    Person(s) Educated  Patient    Methods  Explanation;Demonstration;Verbal cues    Comprehension  Returned demonstration;Verbalized understanding       PT Short Term Goals - 04/26/19 1506      PT SHORT TERM GOAL #1   Title  independent with initial HEP    Time  4    Period  Weeks    Status  Partially Met   Does semi often     PT SHORT TERM GOAL #2   Title  The patient will  report a 25% improvement in sleep    Time  4    Period  Weeks    Status  On-going   20%     PT SHORT TERM GOAL #3   Title  Improved cervical rotation to 40 degrees and improved sidebending to 30 degrees bilaterally needed for driving    Time  4    Period  Weeks    Status  Achieved        PT Long Term Goals - 05/18/19 1243      PT LONG TERM GOAL #1   Title  independent with HEP and understand how to progress herself    Time  8    Period  Weeks    Status  On-going    Target Date  07/12/19      PT LONG TERM GOAL #2   Title  The 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    Time  8    Period  Weeks    Status  On-going    Target Date  07/12/19      PT LONG TERM GOAL #3   Title  The patient will have improved cervical flexion and extension to 55 degrees and rotation to 45 degrees 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)    Time  8    Status  Achieved      PT LONG TERM GOAL #4   Title  increased cervical and periscapular strength to grossly 4/5 needed for maintaining upright posture and peforming home and work ADLS    Time  8    Period  Weeks    Status  On-going    Target Date  07/12/19      PT LONG TERM GOAL #5   Title  FOTO score </= 33% limitation    Time  8    Period  Weeks    Status  On-going    Target Date  07/12/19      Additional Long Term Goals   Additional Long Term Goals  Yes      PT LONG TERM GOAL #6   Title  perform sit to stand in <  12 sec to demonstrate improved LE strength and reduced risk of falls    Time  8    Period  Weeks    Status  New    Target Date  07/12/19      PT LONG TERM GOAL #7   Title  report a 75% reduction of back and hip pain in the morning and getting up from seated position    Time  8    Period  Weeks    Status  New    Target Date  07/12/19      PT LONG TERM GOAL #8   Title  Pt will be able to understand explain at least two ways she can manage her low back pain at home.     Time  8    Period  Weeks    Status  New    Target Date  07/12/19            Plan - 06/07/19 1610    Clinical Impression Statement  Pt doing a much better job with her HEP compliance despiste ongoing significant life stresors. Pt was given additional HEP for Bil hip abduction strengthening which we discussed ways to implement in different ways throughout the day. 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; OA; fibromyalgia; lack of motivation    Examination-Activity Limitations  Lift;Sleep;Sit    Examination-Participation Restrictions  Driving;Community Activity;Other;School    Stability/Clinical Decision Making  Evolving/Moderate complexity    Rehab Potential  Good    PT Frequency  2x / week    PT Duration  8 weeks    PT Treatment/Interventions  ADLs/Self Care Home Management;Cryotherapy;Electrical Stimulation;Ultrasound;Moist Heat;Traction;Therapeutic exercise;Therapeutic activities;Neuromuscular re-education;Manual techniques;Patient/family education;Dry needling;Taping    PT Next Visit Plan  Pt does not like the Nustep, it bothers her knee. Continbue with hip strength and flexibility, add band for upper back/postural strength and endurance. Manual to cervical ( if needed) and Rt hip/lumbar.    PT Home Exercise Plan  Access Code: [email or web address]    Consulted and Agree with Plan of Care  Patient       Patient will benefit from skilled therapeutic intervention in order to improve the following deficits and impairments:  Increased fascial restricitons, Decreased range of motion, Increased muscle spasms, Pain, Decreased strength, Postural dysfunction  Visit Diagnosis: Cervicalgia  Muscle weakness (generalized)  Other  muscle spasm  Chronic bilateral low back pain, unspecified whether sciatica present     Problem List Patient Active Problem List   Diagnosis Date Noted  . Vitamin D deficiency 05/03/2019  . Multiple thyroid nodules 03/08/2019  . Oropharyngeal dysphagia 03/08/2019  . Throat pain 02/14/2019  . Constipation 12/26/2018  . Abdominal pain 12/26/2018  . Brachial neuritis of right upper extremity 07/06/2018  . Well adult exam 03/16/2018  . Obesity 03/16/2018  . Conjunctivitis 07/14/2017  . Foreign body of right ear 12/01/2016  . Sweats, menopausal 10/20/2016  . Panic attacks 10/20/2016  . Loss of transverse plantar arch of right foot 09/07/2016  . Other bursal cyst of wrist 08/11/2016  . Posterior right knee pain 07/16/2016  . Knee pain, right 05/21/2016  . Hamstring strain, sequela 05/21/2016  . Wart viral 05/12/2016  . Contusion of knee 05/05/2016  . Fall involving sidewalk curb 05/05/2016  . Aphasia 03/13/2016  . Shoulder  pain, bilateral 12/11/2015  . Fibromyalgia 12/11/2015  . Positional vertigo 11/15/2015  . Post concussion syndrome 11/01/2015  . Clavicle fracture 10/30/2015  . Hair loss 10/11/2015  . Cervical radicular pain 09/20/2015  . Pain in joint, shoulder region 09/11/2015  . Right wrist pain 08/07/2015  . Splinter in skin 07/30/2015  . Fall from ladder 07/30/2015  . Skull fracture with concussion (HCC) 07/30/2015  . Fracture of cervical vertebra, C6 (HCC) 07/30/2015  . Thoracic vertebral fracture (HCC) 07/30/2015  . Closed fracture of base of fourth metacarpal bone of left hand 06/30/2015  . Laceration of hand, left 06/30/2015  . Abdominal pain, epigastric 05/07/2015  . Scaly patch rash 02/26/2015  . Mitral valve regurgitation 09/27/2014  . PAT (paroxysmal atrial tachycardia) (HCC) 09/06/2014  . Tachycardia 07/31/2014  . Sprain of ankle 06/29/2014  . Earache, left 05/15/2013  . Dizziness 05/15/2013  . Asthmatic bronchitis 05/08/2013  . Benign paroxysmal  positional vertigo 05/08/2013  . Chest wall pain 01/26/2013  . LUQ abdominal pain 01/20/2013  . Insomnia 01/10/2013  . Depression (emotion) 01/10/2013  . URI, acute 08/25/2012  . Snoring 08/25/2012  . IBS (irritable bowel syndrome) 02/08/2012  . Multiple pulmonary nodules 02/08/2012  . Chest heaviness 11/16/2010  . GERD 05/24/2010  . SINUSITIS, MAXILLARY, CHRONIC 05/20/2010  . Myalgia and myositis 11/20/2009  . Fatigue 11/20/2009  . ARTHRALGIA 06/26/2009  . Headache 06/26/2009  . TOBACCO USE, QUIT 06/26/2009  . LOW BACK PAIN 02/20/2009  . Rosacea 12/27/2008  . PRURITUS 12/27/2008  . Allergic urticaria 12/27/2008  . B12 deficiency 01/02/2008    Kingsten Enfield, PTA 06/07/2019, 9:40 AM  [facility] [address], [address] [place], [place], [address] Phone: [phone]   Fax:  [phone]  Name: Regina [name] MRN: [id number] Date of Birth: 13-Dec-1965

## 2019-06-14 ENCOUNTER — Ambulatory Visit: Payer: BC Managed Care – PPO | Admitting: Physical Therapy

## 2019-06-14 ENCOUNTER — Encounter: Payer: Self-pay | Admitting: Physical Therapy

## 2019-06-14 ENCOUNTER — Other Ambulatory Visit: Payer: Self-pay

## 2019-06-14 DIAGNOSIS — M62838 Other muscle spasm: Secondary | ICD-10-CM

## 2019-06-14 DIAGNOSIS — M6281 Muscle weakness (generalized): Secondary | ICD-10-CM

## 2019-06-14 DIAGNOSIS — G8929 Other chronic pain: Secondary | ICD-10-CM

## 2019-06-14 DIAGNOSIS — M542 Cervicalgia: Secondary | ICD-10-CM

## 2019-06-14 NOTE — Therapy (Signed)
[facility] [address], [address] [place], [place], [address] Phone: [phone]   Fax:  [phone]  Physical Therapy Treatment  Patient Details  Name: Regina [name] MRN: [id number] Date of Birth: 09/24/1965 Referring Provider (PT): Dr. Posey Rea   Encounter Date: 06/14/2019  PT End of Session - 06/14/19 0816    Visit Number  20    Date for PT Re-Evaluation  07/12/19    Authorization Type  BCBS    Authorization - Visit Number  5    Authorization - Number of Visits  6    PT Start Time  0815   Pt 15 min late   PT Stop Time  0927    PT Time Calculation (min)  72 min    Activity Tolerance  Patient tolerated treatment well    Behavior During Therapy  [facility] for tasks assessed/performed       Past Medical History:  Diagnosis Date  . Allergic urticaria   . ARTHRALGIA   . Arthritis   . Atrial fibrillation (HCC)   . B12 DEFICIENCY   . CFS (chronic fatigue syndrome)   . Endometriosis   . Epidural hemorrhage with loss of consciousness (HCC) 06/29/15  . Fibroid   . Fibromyalgia   . Fracture of cervical vertebra, C6 (HCC) 06/29/15  . Gastritis   . GERD   . Heart murmur   . History of endometriosis   . IBS (irritable bowel syndrome)   . Internal hemorrhoids   . MVP (mitral valve prolapse)   . Osteoarthritis   . Rosacea   . Skin cancer of arm, right   . Thoracic compression fracture (HCC) 06/29/15   T2-T6    Past Surgical History:  Procedure Laterality Date  . ABDOMINAL HYSTERECTOMY     partial  . ABDOMINAL HYSTERECTOMY    . ANAL FISSURE REPAIR     Dr Zachery Dakins 2009  . APPENDECTOMY    . COLPOSCOPY    . HEMORRHOID SURGERY     Dr. Zachery Dakins 2009  . OVARIAN CYST REMOVAL    . RT BUNIONECTOMT    . TONSILLECTOMY AND ADENOIDECTOMY    . TUBAL LIGATION    . UTERINE FIBROID SURGERY      There were no vitals filed for this visit.  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  No/denies   I just feel a little tight this AM.   Multiple Pain Sites  No         [facility] PT Assessment - 06/14/19 0001      Strength   Overall Strength Comments  RT hip abduction 4/5                   [facility] Adult PT Treatment/Exercise - 06/14/19 0001      Neck Exercises: Supine   Other Supine Exercise  Red band diagonals 10x bil       Knee/Hip Exercises: Supine   Bridges  --   6x   Bridges Limitations  Bridge with red band hrizontal abd 6x  Other Supine Knee/Hip Exercises  green 15x, VC to post pelvic tilt      Knee/Hip Exercises: Sidelying   Hip ABduction  Strengthening;Right;1 set;10 reps      Shoulder Exercises: ROM/Strengthening   UBE (Upper Arm Bike)  L 1.6 3x3 with PTA present      Manual Therapy   Soft tissue mobilization  Addaday assit to Rt hip post/lateral, along the lateral RT sacral boarder and pirformis   Gluteal compressions in sidelying, cervical work RT >LT              PT Short Term Goals - 06/14/19 0825      PT SHORT TERM GOAL #1   Title  independent with initial HEP    Time  4    Period  Weeks    Status  Achieved    Target Date  03/30/19      PT SHORT TERM GOAL #2   Title  The patient will report a 25% improvement in sleep    Period  Weeks    Status  On-going   12%     PT SHORT TERM GOAL #3   Title  Improved cervical rotation to 40 degrees and improved sidebending to 30 degrees bilaterally needed for driving    Time  4    Period  Weeks    Status  Achieved        PT Long Term Goals - 06/14/19 0827      PT LONG TERM GOAL #1   Title  independent with HEP and understand  how to progress herself    Time  8    Period  Weeks    Status  On-going      PT LONG TERM GOAL #2   Title  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    Time  8    Period  Weeks    Status  On-going            Plan - 06/14/19 0817    Clinical Impression Statement  Pt is making good progress in both reduction of neck and RT hip pain. Pt continues to work on her compliance with her HEP. RT hip abduction MMT improved. Small improvements related to her pain goals and function.    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; OA; fibromyalgia; lack of motivation    Examination-Activity Limitations  Lift;Sleep;Sit    Examination-Participation Restrictions  Driving;Community Activity;Other;School    Stability/Clinical Decision Making  Evolving/Moderate complexity    Rehab Potential  Good    PT Frequency  2x / week    PT Duration  8 weeks  PT Treatment/Interventions  ADLs/Self Care Home Management;Cryotherapy;Electrical Stimulation;Ultrasound;Moist Heat;Traction;Therapeutic exercise;Therapeutic activities;Neuromuscular re-education;Manual techniques;Patient/family education;Dry needling;Taping    PT Next Visit Plan  Pt does not like the Nustep, it bothers her knee. Continbue with hip strength and flexibility, add  band for upper back/postural strength and endurance. Manual to cervical ( if needed) and Rt hip/lumbar.    PT Home Exercise Plan  Access Code: [email or web address]    Consulted and Agree with Plan of Care  Patient       Patient will benefit from skilled therapeutic intervention in order to improve the following deficits and impairments:  Increased fascial restricitons, Decreased range of motion, Increased muscle spasms, Pain, Decreased strength, Postural dysfunction  Visit Diagnosis: Cervicalgia  Muscle weakness (generalized)  Other muscle spasm  Chronic bilateral low back pain, unspecified whether sciatica present     Problem List Patient Active Problem List   Diagnosis Date Noted  . Vitamin D deficiency 05/03/2019  . Multiple thyroid nodules 03/08/2019  . Oropharyngeal dysphagia 03/08/2019  . Throat pain 02/14/2019  . Constipation 12/26/2018  . Abdominal pain 12/26/2018  . Brachial neuritis of right upper extremity 07/06/2018  . Well adult exam 03/16/2018  . Obesity 03/16/2018  . Conjunctivitis 07/14/2017  . Foreign body of right ear 12/01/2016  . Sweats, menopausal 10/20/2016  . Panic attacks 10/20/2016  . Loss of transverse plantar arch of right foot 09/07/2016  . Other bursal cyst of wrist 08/11/2016  . Posterior right knee pain 07/16/2016  . Knee pain, right 05/21/2016  . Hamstring strain, sequela 05/21/2016  . Wart viral 05/12/2016  . Contusion of knee 05/05/2016  . Fall involving sidewalk curb 05/05/2016  . Aphasia 03/13/2016  . Shoulder pain, bilateral 12/11/2015  . Fibromyalgia 12/11/2015  . Positional vertigo 11/15/2015  . Post concussion syndrome 11/01/2015  . Clavicle fracture 10/30/2015  . Hair loss 10/11/2015  . Cervical radicular pain 09/20/2015  . Pain in joint, shoulder region 09/11/2015  . Right wrist pain 08/07/2015  . Splinter in skin 07/30/2015  . Fall from ladder 07/30/2015  . Skull fracture with concussion (HCC) 07/30/2015  . Fracture of cervical  vertebra, C6 (HCC) 07/30/2015  . Thoracic vertebral fracture (HCC) 07/30/2015  . Closed fracture of base of fourth metacarpal bone of left hand 06/30/2015  . Laceration of hand, left 06/30/2015  . Abdominal pain, epigastric 05/07/2015  . Scaly patch rash 02/26/2015  . Mitral valve regurgitation 09/27/2014  . PAT (paroxysmal atrial tachycardia) (HCC) 09/06/2014  . Tachycardia 07/31/2014  . Sprain of ankle 06/29/2014  . Earache, left 05/15/2013  . Dizziness 05/15/2013  . Asthmatic bronchitis 05/08/2013  . Benign paroxysmal positional vertigo 05/08/2013  . Chest wall pain 01/26/2013  . LUQ abdominal pain 01/20/2013  . Insomnia 01/10/2013  . Depression (emotion) 01/10/2013  . URI, acute 08/25/2012  . Snoring 08/25/2012  . IBS (irritable bowel syndrome) 02/08/2012  . Multiple pulmonary nodules 02/08/2012  . Chest heaviness 11/16/2010  . GERD 05/24/2010  . SINUSITIS, MAXILLARY, CHRONIC 05/20/2010  . Myalgia and myositis 11/20/2009  . Fatigue 11/20/2009  . ARTHRALGIA 06/26/2009  . Headache 06/26/2009  . TOBACCO USE, QUIT 06/26/2009  . LOW BACK PAIN 02/20/2009  . Rosacea 12/27/2008  . PRURITUS 12/27/2008  . Allergic urticaria 12/27/2008  . B12 deficiency 01/02/2008    Makana Feigel, PTA 06/14/2019, 9:31 AM  [facility] [address] [place], [place], [address] Phone: [phone]   Fax:  [phone]  Name: Regina [name] MRN: [id number] Date of Birth:  1966/03/15

## 2019-06-21 ENCOUNTER — Encounter: Payer: Self-pay | Admitting: Physical Therapy

## 2019-06-21 ENCOUNTER — Ambulatory Visit: Payer: BC Managed Care – PPO | Admitting: Physical Therapy

## 2019-06-21 ENCOUNTER — Other Ambulatory Visit: Payer: Self-pay

## 2019-06-21 DIAGNOSIS — M62838 Other muscle spasm: Secondary | ICD-10-CM

## 2019-06-21 DIAGNOSIS — M542 Cervicalgia: Secondary | ICD-10-CM

## 2019-06-21 DIAGNOSIS — M545 Low back pain, unspecified: Secondary | ICD-10-CM

## 2019-06-21 DIAGNOSIS — M6281 Muscle weakness (generalized): Secondary | ICD-10-CM

## 2019-06-21 DIAGNOSIS — G8929 Other chronic pain: Secondary | ICD-10-CM

## 2019-06-21 NOTE — Therapy (Addendum)
paroxysmal positional vertigo 05/08/2013  . Chest wall pain 01/26/2013  . LUQ abdominal pain 01/20/2013  . Insomnia 01/10/2013  . Depression (emotion) 01/10/2013  . URI, acute 08/25/2012  . Snoring 08/25/2012  . IBS (irritable bowel syndrome) 02/08/2012  . Multiple pulmonary nodules 02/08/2012  . Chest heaviness 11/16/2010  . GERD 05/24/2010  . SINUSITIS, MAXILLARY, CHRONIC 05/20/2010  . Myalgia and myositis 11/20/2009  . Fatigue 11/20/2009  . ARTHRALGIA 06/26/2009  . Headache 06/26/2009  . TOBACCO USE, QUIT 06/26/2009   . LOW BACK PAIN 02/20/2009  . Rosacea 12/27/2008  . PRURITUS 12/27/2008  . Allergic urticaria 12/27/2008  . B12 deficiency 01/02/2008   Ruben Im, PT 03/07/20 8:14 AM Phone: [phone] Fax: [phone] Corbin Falck, PTA 06/21/2019, 9:27 AM  [place] [address] [place], [place], [address] Phone: [phone]   Fax:  [phone]  Name: Regina [name] MRN: [id number] Date of Birth: 1966/01/07  paroxysmal positional vertigo 05/08/2013  . Chest wall pain 01/26/2013  . LUQ abdominal pain 01/20/2013  . Insomnia 01/10/2013  . Depression (emotion) 01/10/2013  . URI, acute 08/25/2012  . Snoring 08/25/2012  . IBS (irritable bowel syndrome) 02/08/2012  . Multiple pulmonary nodules 02/08/2012  . Chest heaviness 11/16/2010  . GERD 05/24/2010  . SINUSITIS, MAXILLARY, CHRONIC 05/20/2010  . Myalgia and myositis 11/20/2009  . Fatigue 11/20/2009  . ARTHRALGIA 06/26/2009  . Headache 06/26/2009  . TOBACCO USE, QUIT 06/26/2009   . LOW BACK PAIN 02/20/2009  . Rosacea 12/27/2008  . PRURITUS 12/27/2008  . Allergic urticaria 12/27/2008  . B12 deficiency 01/02/2008   Ruben Im, PT 03/07/20 8:14 AM Phone: [phone] Fax: [phone] Corbin Falck, PTA 06/21/2019, 9:27 AM  [place] [address] [place], [place], [address] Phone: [phone]   Fax:  [phone]  Name: Regina [name] MRN: [id number] Date of Birth: 1966/01/07  paroxysmal positional vertigo 05/08/2013  . Chest wall pain 01/26/2013  . LUQ abdominal pain 01/20/2013  . Insomnia 01/10/2013  . Depression (emotion) 01/10/2013  . URI, acute 08/25/2012  . Snoring 08/25/2012  . IBS (irritable bowel syndrome) 02/08/2012  . Multiple pulmonary nodules 02/08/2012  . Chest heaviness 11/16/2010  . GERD 05/24/2010  . SINUSITIS, MAXILLARY, CHRONIC 05/20/2010  . Myalgia and myositis 11/20/2009  . Fatigue 11/20/2009  . ARTHRALGIA 06/26/2009  . Headache 06/26/2009  . TOBACCO USE, QUIT 06/26/2009   . LOW BACK PAIN 02/20/2009  . Rosacea 12/27/2008  . PRURITUS 12/27/2008  . Allergic urticaria 12/27/2008  . B12 deficiency 01/02/2008   Ruben Im, PT 03/07/20 8:14 AM Phone: [phone] Fax: [phone] Corbin Falck, PTA 06/21/2019, 9:27 AM  [place] [address] [place], [place], [address] Phone: [phone]   Fax:  [phone]  Name: Regina [name] MRN: [id number] Date of Birth: 1966/01/07  paroxysmal positional vertigo 05/08/2013  . Chest wall pain 01/26/2013  . LUQ abdominal pain 01/20/2013  . Insomnia 01/10/2013  . Depression (emotion) 01/10/2013  . URI, acute 08/25/2012  . Snoring 08/25/2012  . IBS (irritable bowel syndrome) 02/08/2012  . Multiple pulmonary nodules 02/08/2012  . Chest heaviness 11/16/2010  . GERD 05/24/2010  . SINUSITIS, MAXILLARY, CHRONIC 05/20/2010  . Myalgia and myositis 11/20/2009  . Fatigue 11/20/2009  . ARTHRALGIA 06/26/2009  . Headache 06/26/2009  . TOBACCO USE, QUIT 06/26/2009   . LOW BACK PAIN 02/20/2009  . Rosacea 12/27/2008  . PRURITUS 12/27/2008  . Allergic urticaria 12/27/2008  . B12 deficiency 01/02/2008   Ruben Im, PT 03/07/20 8:14 AM Phone: [phone] Fax: [phone] Corbin Falck, PTA 06/21/2019, 9:27 AM  [place] [address] [place], [place], [address] Phone: [phone]   Fax:  [phone]  Name: Regina [name] MRN: [id number] Date of Birth: 1966/01/07

## 2019-06-28 ENCOUNTER — Encounter: Payer: BC Managed Care – PPO | Admitting: Physical Therapy

## 2019-06-28 ENCOUNTER — Other Ambulatory Visit: Payer: Self-pay

## 2019-06-28 DIAGNOSIS — E538 Deficiency of other specified B group vitamins: Secondary | ICD-10-CM

## 2019-06-28 DIAGNOSIS — E559 Vitamin D deficiency, unspecified: Secondary | ICD-10-CM

## 2019-07-06 ENCOUNTER — Other Ambulatory Visit: Payer: Self-pay

## 2019-07-06 ENCOUNTER — Other Ambulatory Visit (INDEPENDENT_AMBULATORY_CARE_PROVIDER_SITE_OTHER): Payer: BC Managed Care – PPO

## 2019-07-06 DIAGNOSIS — E538 Deficiency of other specified B group vitamins: Secondary | ICD-10-CM | POA: Diagnosis not present

## 2019-07-06 DIAGNOSIS — E559 Vitamin D deficiency, unspecified: Secondary | ICD-10-CM | POA: Diagnosis not present

## 2019-07-06 LAB — VITAMIN D 25 HYDROXY (VIT D DEFICIENCY, FRACTURES): VITD: 23.08 ng/mL — ABNORMAL LOW (ref 30.00–100.00)

## 2019-07-06 LAB — VITAMIN B12: Vitamin B-12: 180 pg/mL — ABNORMAL LOW (ref 211–911)

## 2019-07-12 ENCOUNTER — Other Ambulatory Visit: Payer: Self-pay

## 2019-07-12 ENCOUNTER — Ambulatory Visit: Payer: BC Managed Care – PPO | Admitting: Internal Medicine

## 2019-07-12 ENCOUNTER — Encounter: Payer: BC Managed Care – PPO | Admitting: Physical Therapy

## 2019-07-12 ENCOUNTER — Encounter: Payer: Self-pay | Admitting: Internal Medicine

## 2019-07-12 DIAGNOSIS — E559 Vitamin D deficiency, unspecified: Secondary | ICD-10-CM | POA: Diagnosis not present

## 2019-07-12 DIAGNOSIS — E538 Deficiency of other specified B group vitamins: Secondary | ICD-10-CM

## 2019-07-12 DIAGNOSIS — R5382 Chronic fatigue, unspecified: Secondary | ICD-10-CM

## 2019-07-12 MED ORDER — CYANOCOBALAMIN 1000 MCG/ML IJ SOLN
1000.0000 ug | Freq: Once | INTRAMUSCULAR | Status: AC
  Administered 2019-07-12: 1000 ug via INTRAMUSCULAR

## 2019-07-12 MED ORDER — CYANOCOBALAMIN 1000 MCG/ML IJ SOLN: 1000.0000 ug | Freq: Once | INTRAMUSCULAR | 5 refills | Status: AC

## 2019-07-12 MED ORDER — "BD ECLIPSE SYRINGE 25G X 1"" 3 ML MISC": 3 refills | Status: AC

## 2019-07-12 NOTE — Assessment & Plan Note (Signed)
Refractory Vit B12 10000 mcg sq weekly, then biweekly

## 2019-07-12 NOTE — Assessment & Plan Note (Signed)
Refractory Vit D 10000 iu qd

## 2019-07-12 NOTE — Progress Notes (Signed)
Subjective:  Patient ID: Regina [name], female    DOB: October 23, 1965  Age: 54 y.o. MRN: [id number]  CC: No chief complaint on file.   HPI Regina [name] presents for Vit d and B12 def C/o occ wheezing  Outpatient Medications Prior to Visit  Medication Sig Dispense Refill  . Ascorbic Acid (VITAMIN C PO) Take by mouth.    . CASCARA SAGRADA PO Take by mouth.    . Cholecalciferol (VITAMIN D3) 125 MCG (5000 UT) TABS Take 10,000 Units by mouth daily.    . Cyanocobalamin (B-12) 1000 MCG SUBL Place under the tongue daily.    . fluocinonide (LIDEX) 0.05 % external solution APPLY ON THE SCALP DAILY    . pantoprazole (PROTONIX) 40 MG tablet Take 1 tablet (40 mg total) by mouth daily. 90 tablet 3  . Probiotic Product (PROBIOTIC PO) Take by mouth.    . Turmeric 500 MG TABS Take by mouth 2 (two) times daily.    Marland Kitchen VITAMIN K PO Take by mouth.    Marland Kitchen albuterol (VENTOLIN HFA) 108 (90 Base) MCG/ACT inhaler INHALE 2 PUFFS INTO THE LUNGS EVERY 6 (SIX) HOURS AS NEEDED FOR WHEEZING OR SHORTNESS OF BREATH (BEFORE EXERCISING). (Patient not taking: Reported on 07/12/2019) 8.5 g 3  . amoxicillin-clavulanate (AUGMENTIN) 875-125 MG tablet Take 1 tablet by mouth 2 (two) times daily. (Patient not taking: Reported on 07/12/2019) 20 tablet 0  . cephALEXin (KEFLEX) 500 MG capsule Take 1 capsule (500 mg total) by mouth 4 (four) times daily. (Patient not taking: Reported on 07/12/2019) 28 capsule 0  . Cholecalciferol (GNP VITAMIN D) 1000 units tablet Take 2 tablets (2,000 Units total) by mouth daily. (Patient not taking: Reported on 07/12/2019) 100 tablet 3  . cyanocobalamin (,VITAMIN B-12,) 1000 MCG/ML injection Inject 1 mL (1,000 mcg total) into the skin every 14 (fourteen) days. (Patient not taking: Reported on 07/12/2019) 10 mL 11  . gabapentin (NEURONTIN) 300 MG capsule nightly (Patient not taking: Reported on 07/12/2019) 30 capsule 3  . linaclotide (LINZESS) 290 MCG CAPS capsule Take 1 capsule (290 mcg total) by mouth daily.  (Patient not taking: Reported on 07/12/2019) 90 capsule 3   No facility-administered medications prior to visit.    ROS: Review of Systems  Objective:  BP 122/68 (BP Location: Left Arm, Patient Position: Sitting, Cuff Size: Normal)   Pulse 71   Temp 98.1 F (36.7 C) (Oral)   Ht 5\' 4"  (1.626 m)   Wt 176 lb (79.8 kg)   SpO2 96%   BMI 30.21 kg/m   BP Readings from Last 3 Encounters:  07/12/19 122/68  05/03/19 126/70  02/14/19 120/68    Wt Readings from Last 3 Encounters:  07/12/19 176 lb (79.8 kg)  02/14/19 172 lb (78 kg)  01/24/19 179 lb (81.2 kg)    Physical Exam  Lab Results  Component Value Date   WBC 5.8 12/27/2018   HGB 13.0 12/27/2018   HCT 38.7 12/27/2018   PLT 330.0 12/27/2018   GLUCOSE 90 12/27/2018   CHOL 213 (H) 08/23/2018   TRIG 118 08/23/2018   HDL 59 08/23/2018   LDLCALC 130 (H) 08/23/2018   ALT 17 12/27/2018   AST 15 12/27/2018   NA 138 12/27/2018   K 4.2 12/27/2018   CL 102 12/27/2018   CREATININE 0.71 12/27/2018   BUN 12 12/27/2018   CO2 28 12/27/2018   TSH 1.20 12/27/2018   INR 1.16 06/29/2015   HGBA1C 5.4 08/23/2018    US THYROID  Result Date: 03/30/2019 CLINICAL DATA:  Nodules EXAM: THYROID ULTRASOUND TECHNIQUE: Ultrasound examination of the thyroid gland and adjacent soft tissues was performed. COMPARISON:  None available FINDINGS: Parenchymal Echotexture: Mildly heterogenous Isthmus: 0.2 cm thickness Right lobe: 4.4 x 1.7 x 1.3 cm Left lobe: 4.6 x 1.4 x 1.4 cm _________________________________________________________ Estimated total number of nodules >/= 1 cm: 0 Number of spongiform nodules >/=  2 cm not described below (TR1): 0 Number of mixed cystic and solid nodules >/= 1.5 cm not described below (TR2): 0 _________________________________________________________ 0.7 cm hypoechoic nodule without calcifications, mid right; This nodule does NOT meet TI-RADS criteria for biopsy or dedicated follow-up. IMPRESSION: Normal-sized thyroid with  single subcentimeter right nodule, which does not meet criteria for biopsy or dedicated imaging follow-up. The above is in keeping with the ACR TI-RADS recommendations - J Am Coll Radiol 2017;14:587-595. Electronically Signed   By: Lucrezia Europe M.D.   On: 03/30/2019 15:28    Assessment & Plan:   There are no diagnoses linked to this encounter.   No orders of the defined types were placed in this encounter.    Follow-up: No follow-ups on file.  Walker Kehr, MD

## 2019-07-12 NOTE — Addendum Note (Signed)
Addended by: Karren Cobble on: 07/12/2019 04:44 PM   Modules accepted: Orders

## 2019-07-12 NOTE — Assessment & Plan Note (Signed)
[facility] in 3 mo

## 2019-07-19 ENCOUNTER — Encounter: Payer: BC Managed Care – PPO | Admitting: Physical Therapy

## 2019-07-26 ENCOUNTER — Encounter: Payer: BC Managed Care – PPO | Admitting: Physical Therapy

## 2019-09-13 ENCOUNTER — Ambulatory Visit: Payer: BC Managed Care – PPO | Admitting: Obstetrics and Gynecology

## 2019-10-12 ENCOUNTER — Ambulatory Visit: Payer: BC Managed Care – PPO | Admitting: Internal Medicine

## 2019-10-24 ENCOUNTER — Ambulatory Visit: Payer: BC Managed Care – PPO | Admitting: Internal Medicine

## 2019-10-24 ENCOUNTER — Other Ambulatory Visit: Payer: Self-pay

## 2019-10-24 ENCOUNTER — Encounter: Payer: Self-pay | Admitting: Internal Medicine

## 2019-10-24 DIAGNOSIS — F439 Reaction to severe stress, unspecified: Secondary | ICD-10-CM

## 2019-10-24 DIAGNOSIS — F410 Panic disorder [episodic paroxysmal anxiety] without agoraphobia: Secondary | ICD-10-CM

## 2019-10-24 DIAGNOSIS — E559 Vitamin D deficiency, unspecified: Secondary | ICD-10-CM

## 2019-10-24 DIAGNOSIS — E538 Deficiency of other specified B group vitamins: Secondary | ICD-10-CM

## 2019-10-24 DIAGNOSIS — M778 Other enthesopathies, not elsewhere classified: Secondary | ICD-10-CM

## 2019-10-24 DIAGNOSIS — S0291XS Unspecified fracture of skull, sequela: Secondary | ICD-10-CM

## 2019-10-24 DIAGNOSIS — S060X9S Concussion with loss of consciousness of unspecified duration, sequela: Secondary | ICD-10-CM

## 2019-10-24 NOTE — Assessment & Plan Note (Signed)
Discussed - husband was dx'd with MG and broke his leg

## 2019-10-24 NOTE — Assessment & Plan Note (Signed)
No relapse 

## 2019-10-24 NOTE — Patient Instructions (Addendum)
You can try Lion's Mane mushroom for memory, focus  Voltaren gel for the wrist

## 2019-10-24 NOTE — Assessment & Plan Note (Signed)
Remote Memory issues - try Lion's Mane mushroom

## 2019-10-24 NOTE — Assessment & Plan Note (Signed)
L wrist - ulnar aspect - due to overuse ACE wrap  Votaren gel

## 2019-10-24 NOTE — Assessment & Plan Note (Addendum)
On B12 Labs 

## 2019-10-24 NOTE — Assessment & Plan Note (Signed)
Labs

## 2019-10-24 NOTE — Progress Notes (Signed)
Subjective:  Patient ID: Regina [name], female    DOB: [birth year]  Age: 54 y.o. MRN: [id number]  CC: No chief complaint on file.   HPI Regina [name] presents for Vit D and B12 def C/o stress - husband was dx'd with MG and broke his leg  Outpatient Medications Prior to Visit  Medication Sig Dispense Refill  . Ascorbic Acid (VITAMIN C PO) Take by mouth.    . CASCARA SAGRADA PO Take by mouth.    . Cholecalciferol (VITAMIN D3) 125 MCG (5000 UT) TABS Take 10,000 Units by mouth daily.    . fluocinonide (LIDEX) 0.05 % external solution APPLY ON THE SCALP DAILY    . pantoprazole (PROTONIX) 40 MG tablet Take 1 tablet (40 mg total) by mouth daily. 90 tablet 3  . Probiotic Product (PROBIOTIC PO) Take by mouth.    . SYRINGE-NEEDLE, DISP, 3 ML (BD ECLIPSE SYRINGE) 25G X 1" 3 ML MISC For SQ Vit B12 shots 50 each 3  . Turmeric 500 MG TABS Take by mouth 2 (two) times daily.    Marland Kitchen VITAMIN K PO Take by mouth.     No facility-administered medications prior to visit.    ROS: Review of Systems  Constitutional: Positive for fatigue and unexpected weight change. Negative for activity change, appetite change and chills.  HENT: Negative for congestion, mouth sores and sinus pressure.   Eyes: Negative for visual disturbance.  Respiratory: Negative for cough and chest tightness.   Gastrointestinal: Negative for abdominal pain and nausea.  Genitourinary: Negative for difficulty urinating, frequency and vaginal pain.  Musculoskeletal: Positive for arthralgias and back pain. Negative for gait problem.  Skin: Negative for pallor and rash.  Neurological: Negative for dizziness, tremors, weakness, numbness and headaches.  Psychiatric/Behavioral: Positive for decreased concentration. Negative for confusion, sleep disturbance and suicidal ideas. The patient is nervous/anxious.     Objective:  BP 100/68 (BP Location: Right Arm, Patient Position: Sitting, Cuff Size: Normal)   Pulse 78   Temp 97.9 F (36.6  C) (Oral)   Ht 5\' 4"  (1.626 m)   Wt 176 lb (79.8 kg)   SpO2 98%   BMI 30.21 kg/m   BP Readings from Last 3 Encounters:  10/24/19 100/68  07/12/19 122/68  05/03/19 126/70    Wt Readings from Last 3 Encounters:  10/24/19 176 lb (79.8 kg)  07/12/19 176 lb (79.8 kg)  02/14/19 172 lb (78 kg)    Physical Exam Constitutional:      General: She is not in acute distress.    Appearance: She is well-developed. She is obese.  HENT:     Head: Normocephalic.     Right Ear: External ear normal.     Left Ear: External ear normal.     Nose: Nose normal.  Eyes:     General:        Right eye: No discharge.        Left eye: No discharge.     Conjunctiva/sclera: Conjunctivae normal.     Pupils: Pupils are equal, round, and reactive to light.  Neck:     Thyroid: No thyromegaly.     Vascular: No JVD.     Trachea: No tracheal deviation.  Cardiovascular:     Rate and Rhythm: Normal rate and regular rhythm.     Heart sounds: Normal heart sounds.  Pulmonary:     Effort: No respiratory distress.     Breath sounds: No stridor. No wheezing.  Abdominal:  General: Bowel sounds are normal. There is no distension.     Palpations: Abdomen is soft. There is no mass.     Tenderness: There is no abdominal tenderness. There is no guarding or rebound.  Musculoskeletal:        General: Tenderness present.     Cervical back: Normal range of motion and neck supple.  Lymphadenopathy:     Cervical: No cervical adenopathy.  Skin:    Findings: No erythema or rash.  Neurological:     Mental Status: She is oriented to person, place, and time.     Cranial Nerves: No cranial nerve deficit.     Motor: No abnormal muscle tone.     Coordination: Coordination normal.     Deep Tendon Reflexes: Reflexes normal.  Psychiatric:        Behavior: Behavior normal.        Thought Content: Thought content normal.        Judgment: Judgment normal.    L wrist - ulnar aspect is tender  Lab Results  Component  Value Date   WBC 5.8 12/27/2018   HGB 13.0 12/27/2018   HCT 38.7 12/27/2018   PLT 330.0 12/27/2018   GLUCOSE 90 12/27/2018   CHOL 213 (H) 08/23/2018   TRIG 118 08/23/2018   HDL 59 08/23/2018   LDLCALC 130 (H) 08/23/2018   ALT 17 12/27/2018   AST 15 12/27/2018   NA 138 12/27/2018   K 4.2 12/27/2018   CL 102 12/27/2018   CREATININE 0.71 12/27/2018   BUN 12 12/27/2018   CO2 28 12/27/2018   TSH 1.20 12/27/2018   INR 1.16 06/29/2015   HGBA1C 5.4 08/23/2018    US THYROID  Result Date: 03/30/2019 CLINICAL DATA:  Nodules EXAM: THYROID ULTRASOUND TECHNIQUE: Ultrasound examination of the thyroid gland and adjacent soft tissues was performed. COMPARISON:  None available FINDINGS: Parenchymal Echotexture: Mildly heterogenous Isthmus: 0.2 cm thickness Right lobe: 4.4 x 1.7 x 1.3 cm Left lobe: 4.6 x 1.4 x 1.4 cm _________________________________________________________ Estimated total number of nodules >/= 1 cm: 0 Number of spongiform nodules >/=  2 cm not described below (TR1): 0 Number of mixed cystic and solid nodules >/= 1.5 cm not described below (TR2): 0 _________________________________________________________ 0.7 cm hypoechoic nodule without calcifications, mid right; This nodule does NOT meet TI-RADS criteria for biopsy or dedicated follow-up. IMPRESSION: Normal-sized thyroid with single subcentimeter right nodule, which does not meet criteria for biopsy or dedicated imaging follow-up. The above is in keeping with the ACR TI-RADS recommendations - J Am Coll Radiol 2017;14:587-595. Electronically Signed   By: Lucrezia Europe M.D.   On: 03/30/2019 15:28    Assessment & Plan:   Walker Kehr, MD

## 2019-10-25 LAB — BASIC METABOLIC PANEL WITH GFR
BUN: 16 mg/dL (ref 7–25)
CO2: 29 mmol/L (ref 20–32)
Calcium: 9.5 mg/dL (ref 8.6–10.4)
Chloride: 103 mmol/L (ref 98–110)
Creat: 0.72 mg/dL (ref 0.50–1.05)
GFR, Est African American: 110 mL/min/{1.73_m2} (ref >=60)
GFR, Est Non African American: 95 mL/min/{1.73_m2} (ref >=60)
Glucose, Bld: 102 mg/dL — ABNORMAL HIGH (ref 65–99)
Potassium: 3.9 mmol/L (ref 3.5–5.3)
Sodium: 141 mmol/L (ref 135–146)

## 2019-11-08 ENCOUNTER — Telehealth: Payer: Self-pay | Admitting: *Deleted

## 2019-11-08 NOTE — Telephone Encounter (Signed)
Attempted to reach patient to schedule aex and pap smear. Voicemail full, unable to leave a message at this time.   Patient in 08 recall for 06/21. Patient needs to schedule aex and pap smear.

## 2019-11-09 NOTE — Telephone Encounter (Signed)
Patient scheduled next available aex with Dr. Talbert Nan on 05/14/19.

## 2019-11-15 NOTE — Telephone Encounter (Signed)
Attempted to reach patient to move aex up. Voicemail full, unable to leave a message at this time.

## 2019-12-15 NOTE — Telephone Encounter (Signed)
Call to patient. Patient advised needs to move aex up. 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 RN advised would review schedule and return call with appointment. Patient agreeable.

## 2019-12-15 NOTE — Telephone Encounter (Signed)
Attempted to reach patient with AEX date and time of 01-03-20 at 1630. Voicemail full, unable to leave a message.

## 2019-12-21 ENCOUNTER — Ambulatory Visit (INDEPENDENT_AMBULATORY_CARE_PROVIDER_SITE_OTHER): Payer: BC Managed Care – PPO | Admitting: Family Medicine

## 2019-12-21 ENCOUNTER — Encounter: Payer: Self-pay | Admitting: Family Medicine

## 2019-12-21 ENCOUNTER — Ambulatory Visit: Payer: BC Managed Care – PPO | Admitting: Family Medicine

## 2019-12-21 ENCOUNTER — Other Ambulatory Visit: Payer: Self-pay

## 2019-12-21 ENCOUNTER — Ambulatory Visit: Payer: Self-pay

## 2019-12-21 VITALS — BP 108/72 | HR 70 | Ht 64.0 in | Wt 162.0 lb

## 2019-12-21 DIAGNOSIS — M25561 Pain in right knee: Secondary | ICD-10-CM | POA: Diagnosis not present

## 2019-12-21 DIAGNOSIS — S83241A Other tear of medial meniscus, current injury, right knee, initial encounter: Secondary | ICD-10-CM

## 2019-12-21 DIAGNOSIS — M222X1 Patellofemoral disorders, right knee: Secondary | ICD-10-CM | POA: Diagnosis not present

## 2019-12-21 NOTE — Progress Notes (Signed)
[place] Phone: [phone] Subjective:   Regina [name], am serving as a scribe for Dr. Hulan Saas. This visit occurred during the SARS-CoV-2 public health emergency.  Safety protocols were in place, including screening questions prior to the visit, additional usage of staff PPE, and extensive cleaning of exam room while observing appropriate contact time as indicated for disinfecting solutions.   I'm seeing this patient by the request  of:  Plotnikov, Regina Lacks, MD  CC: Right knee pain  [id number]      Update 12/21/2019 Regina [name] is a 54 y.o. female coming in with complaint of right knee pain. Patient states that for past 2 weeks she has had discomfort in knee due to swelling. States that she has history of Baker's Cyst. Having hard time sleeping due to discomfort. Not using any medication for pain.   Patient is a severity of pain is 5 out of 10  Past Medical History:  Diagnosis Date   Allergic urticaria    ARTHRALGIA    Arthritis    Atrial fibrillation (HCC)    B12 DEFICIENCY    CFS (chronic fatigue syndrome)    Endometriosis    Epidural hemorrhage with loss of consciousness ([place]) 06/29/15   Fibroid    Fibromyalgia    Fracture of cervical vertebra, C6 (HCC) 06/29/15   Gastritis    GERD    Heart murmur    History of endometriosis    IBS (irritable bowel syndrome)    Internal hemorrhoids    MVP (mitral valve prolapse)    Osteoarthritis    Rosacea    Skin cancer of arm, right    Thoracic compression fracture ([facility]) 06/29/15   T2-T6   Past Surgical History:  Procedure Laterality Date   ABDOMINAL HYSTERECTOMY     partial   ABDOMINAL HYSTERECTOMY     ANAL FISSURE REPAIR     Dr Rise [name] 2009   APPENDECTOMY     COLPOSCOPY     HEMORRHOID SURGERY     Dr. Rise [name] 2009   OVARIAN CYST REMOVAL     RT BUNIONECTOMT     TONSILLECTOMY AND ADENOIDECTOMY      TUBAL LIGATION     UTERINE FIBROID SURGERY     Social History   Socioeconomic History   Marital status: Married    Spouse name: Not on file   Number of children: Not on file   Years of education: Not on file   Highest education level: Not on file  Occupational History   Not on file  Tobacco Use   Smoking status: Former Smoker    Quit date: 10/31/1988    Years since quitting: 31.1   Smokeless tobacco: Never Used  Vaping Use   Vaping Use: Never used  Substance and Sexual Activity   Alcohol use: Yes    Alcohol/week: 1.0 - 2.0 standard drink    Types: 1 - 2 Standard drinks or equivalent per week    Comment: occasional    Drug use: [name]   Sexual activity: Yes    Partners: Male    Birth control/protection: Surgical  Other Topics Concern   Not on file  Social History Narrative   ** Merged History Encounter **       Working 2 jobs, 6d/wk      Regular exercise-[name]      Divorced   Social Determinants of Radio broadcast assistant Strain:    Difficulty  of Paying Living Expenses: Not on file  Food Insecurity:    Worried About [place] in the Last Year: Not on file   Ran Out of Food in the Last Year: Not on file  Transportation Needs:    Lack of Transportation (Medical): Not on file   Lack of Transportation (Non-Medical): Not on file  Physical Activity:    Days of Exercise per Week: Not on file   Minutes of Exercise per Session: Not on file  Stress:    Feeling of Stress : Not on file  Social Connections:    Frequency of Communication with Friends and Family: Not on file   Frequency of Social Gatherings with Friends and Family: Not on file   Attends Religious Services: Not on file   Active Member of Clubs or Organizations: Not on file   Attends Archivist Meetings: Not on file   Marital Status: Not on file   Allergies  Allergen Reactions   Indocin [Indomethacin] Swelling, Rash and Other (See Comments)    REACTION: lupus  like illness; butterfly rash, pain and swelling all over body.    Morphine Sulfate Nausea And Vomiting   Sulfacetamide Sodium Swelling   Caffeine Other (See Comments)    Induces A-fib   Morphine And Related     nausea   Sulfa Antibiotics Swelling   Azithromycin Rash    ALL MYCIN DRUGS   Erythromycin Stearate Hives and Swelling    REACTION: all mycins   Family History  Problem Relation Age of Onset   Lung cancer Mother    Thyroid cancer Mother    Cancer Mother        INTESTINAL   Healthy Father    Diabetes Other        uncles/aunts   Von Willebrand disease Daughter    Asthma Neg Hx          Current Outpatient Medications (Other):    Ascorbic Acid (VITAMIN C PO), Take by mouth.   CASCARA SAGRADA PO, Take by mouth.   Cholecalciferol (VITAMIN D3) 125 MCG (5000 UT) TABS, Take 10,000 Units by mouth daily.   fluocinonide (LIDEX) 0.05 % external solution, APPLY ON THE SCALP DAILY   pantoprazole (PROTONIX) 40 MG tablet, Take 1 tablet (40 mg total) by mouth daily.   Probiotic Product (PROBIOTIC PO), Take by mouth.   SYRINGE-NEEDLE, DISP, 3 ML (BD ECLIPSE SYRINGE) 25G X 1" 3 ML MISC, For SQ Vit B12 shots   Turmeric 500 MG TABS, Take by mouth 2 (two) times daily.   VITAMIN K PO, Take by mouth.   Reviewed prior external information including notes and imaging from  primary care provider As well as notes that were available from care everywhere and other healthcare systems.  Past medical history, social, surgical and family history all reviewed in electronic medical record.  [name] pertanent information unless stated regarding to the chief complaint.   Review of Systems:  [name] headache, visual changes, nausea, vomiting, diarrhea, constipation, dizziness, abdominal pain, skin rash, fevers, chills, night sweats, weight loss, swollen lymph nodes, body aches, joint swelling, chest pain, shortness of breath, mood changes. POSITIVE muscle aches  Objective  Blood  pressure 108/72, pulse 70, height 5\' 4"  (1.626 m), weight 162 lb (73.5 kg), SpO2 99 %.   General: [name] apparent distress alert and oriented x3 mood and affect normal, dressed appropriately.  HEENT: Pupils equal, extraocular movements intact  Respiratory: Patient's speak in full sentences and does not appear short  of breath  Cardiovascular: [name] lower extremity edema, non tender, [name] erythema  Neuro: Cranial nerves II through XII are intact, neurovascularly intact in all extremities with 2+ DTRs and 2+ pulses.  Gait normal with good balance and coordination.  MSK  Right knee exam shows the patient does have tenderness to palpation over the p patellofemoral joint.  Patient does have positive McMurray's.  Positive patellar grind test noted.  [name] instability with valgus or varus force.  Contralateral knee unremarkable  Limited musculoskeletal ultrasound was performed and interpreted by Lyndal Pulley  Limited ultrasound of patient's right knee shows patient has some very mild narrowing of the patellofemoral joint.  Medial meniscus is very mild displacement noted of the most posterior aspect.  Minimal displacement.  Otherwise fairly unremarkable. Impression: Medial meniscal tear with some findings consistent with patellofemoral syndrome   Impression and Recommendations:     The above documentation has been reviewed and is accurate and complete Lyndal Pulley, DO       Note: This dictation was prepared with Dragon dictation along with smaller phrase technology. Any transcriptional errors that result from this process are unintentional.

## 2019-12-21 NOTE — Assessment & Plan Note (Signed)
Medial meniscal tear noted on ultrasound today.  We discussed avoiding type of twisting motions.  Because of the patellofemoral patient will be started on different home exercises.  Discussed which activities to doing which wants to avoid.  Patient will avoid cell phone anti-inflammatories.  We discussed topical anti-inflammatories.  Over-the-counter medicines, follow-up again in 4 to 8 weeks possible injection and physical therapy

## 2019-12-21 NOTE — Progress Notes (Deleted)
Regina [name] Phone: [phone] Subjective:    I'm seeing this patient by the request  of:  Plotnikov, [name], MD  CC:   [id number]   07/06/2018 Right-sided brachial neuritis.  Patient does not have a positive Spurling's.  Seems to be worse when she side bends to the left.  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  Discussed different medications.  Gabapentin started.  Patient has been some noncompliant in the past previously.  Will avoid prednisone if possible secondary to corona outbreak.  Discussed icing regimen and home exercise.  Follow-up again in 4 to 8 weeks  Update 12/21/2019 Regina [name] is a 54 y.o. female coming in with complaint of right upper extremity.     Past Medical History:  Diagnosis Date  . Allergic urticaria   . ARTHRALGIA   . Arthritis   . Atrial fibrillation ([place])   . B12 DEFICIENCY   . CFS (chronic fatigue syndrome)   . Endometriosis   . Epidural hemorrhage with loss of consciousness ([place]) 06/29/15  . Fibroid   . Fibromyalgia   . Fracture of cervical vertebra, C6 ([place]) 06/29/15  . Gastritis   . GERD   . Heart murmur   . History of endometriosis   . IBS (irritable bowel syndrome)   . Internal hemorrhoids   . MVP (mitral valve prolapse)   . Osteoarthritis   . Rosacea   . Skin cancer of arm, right   . Thoracic compression fracture ([place]) 06/29/15   T2-T6   Past Surgical History:  Procedure Laterality Date  . ABDOMINAL HYSTERECTOMY     partial  . ABDOMINAL HYSTERECTOMY    . ANAL FISSURE REPAIR     Dr Rise [name] 2009  . APPENDECTOMY    . COLPOSCOPY    . HEMORRHOID SURGERY     Dr. Rise [name] 2009  . OVARIAN CYST REMOVAL    . RT BUNIONECTOMT    . TONSILLECTOMY AND ADENOIDECTOMY    . TUBAL LIGATION    . UTERINE FIBROID SURGERY     Social History   Socioeconomic History  . Marital status: Married    Spouse name: Not on file  . Number of children: Not on file  . Years  of education: Not on file  . Highest education level: Not on file  Occupational History  . Not on file  Tobacco Use  . Smoking status: Former Smoker    Quit date: 10/31/1988    Years since quitting: 31.1  . Smokeless tobacco: Never Used  Vaping Use  . Vaping Use: Never used  Substance and Sexual Activity  . Alcohol use: Yes    Alcohol/week: 1.0 - 2.0 standard drink    Types: 1 - 2 Standard drinks or equivalent per week    Comment: occasional   . Drug use: No  . Sexual activity: Yes    Partners: Male    Birth control/protection: Surgical  Other Topics Concern  . Not on file  Social History Narrative   ** Merged History Encounter **       Working 2 jobs, 6d/wk      Regular exercise-no      Divorced   Social Determinants of Radio broadcast assistant Strain:   . Difficulty of Paying Living Expenses: Not on file  Food Insecurity:   . Worried About Charity fundraiser in the Last Year: Not on file  . Ran Out of Food  in the Last Year: Not on file  Transportation Needs:   . Lack of Transportation (Medical): Not on file  . Lack of Transportation (Non-Medical): Not on file  Physical Activity:   . Days of Exercise per Week: Not on file  . Minutes of Exercise per Session: Not on file  Stress:   . Feeling of Stress : Not on file  Social Connections:   . Frequency of Communication with Friends and Family: Not on file  . Frequency of Social Gatherings with Friends and Family: Not on file  . Attends Religious Services: Not on file  . Active Member of Clubs or Organizations: Not on file  . Attends Archivist Meetings: Not on file  . Marital Status: Not on file   Allergies  Allergen Reactions  . Indocin [Indomethacin] Swelling, Rash and Other (See Comments)    REACTION: lupus like illness; butterfly rash, pain and swelling all over body.   . Morphine Sulfate Nausea And Vomiting  . Sulfacetamide Sodium Swelling  . Caffeine Other (See Comments)    Induces A-fib  .  Morphine And Related     nausea  . Sulfa Antibiotics Swelling  . Azithromycin Rash    ALL MYCIN DRUGS  . Erythromycin Stearate Hives and Swelling    REACTION: all mycins   Family History  Problem Relation Age of Onset  . Lung cancer Mother   . Thyroid cancer Mother   . Cancer Mother        INTESTINAL  . Healthy Father   . Diabetes Other        uncles/aunts  . Von Willebrand disease Daughter   . Asthma Neg Hx          Current Outpatient Medications (Other):  Marland Kitchen  Ascorbic Acid (VITAMIN C PO), Take by mouth. .  CASCARA SAGRADA PO, Take by mouth. .  Cholecalciferol (VITAMIN D3) 125 MCG (5000 UT) TABS, Take 10,000 Units by mouth daily. .  fluocinonide (LIDEX) 0.05 % external solution, APPLY ON THE SCALP DAILY .  pantoprazole (PROTONIX) 40 MG tablet, Take 1 tablet (40 mg total) by mouth daily. .  Probiotic Product (PROBIOTIC PO), Take by mouth. .  SYRINGE-NEEDLE, DISP, 3 ML (BD ECLIPSE SYRINGE) 25G X 1" 3 ML MISC, For SQ Vit B12 shots .  Turmeric 500 MG TABS, Take by mouth 2 (two) times daily. Marland Kitchen  VITAMIN K PO, Take by mouth.   Reviewed prior external information including notes and imaging from  primary care provider As well as notes that were available from care everywhere and other healthcare systems.  Past medical history, social, surgical and family history all reviewed in electronic medical record.  No pertanent information unless stated regarding to the chief complaint.   Review of Systems:  No headache, visual changes, nausea, vomiting, diarrhea, constipation, dizziness, abdominal pain, skin rash, fevers, chills, night sweats, weight loss, swollen lymph nodes, body aches, joint swelling, chest pain, shortness of breath, mood changes. POSITIVE muscle aches  Objective  There were no vitals taken for this visit.   General: No apparent distress alert and oriented x3 mood and affect normal, dressed appropriately.  HEENT: Pupils equal, extraocular movements intact    Respiratory: Patient's speak in full sentences and does not appear short of breath  Cardiovascular: No lower extremity edema, non tender, no erythema  Neuro: Cranial nerves II through XII are intact, neurovascularly intact in all extremities with 2+ DTRs and 2+ pulses.  Gait normal with good balance and coordination.  MSK:  Non tender with full range of motion and good stability and symmetric strength and tone of shoulders, elbows, wrist, hip, knee and ankles bilaterally.     Impression and Recommendations:     The above documentation has been reviewed and is accurate and complete [name]       Note: This dictation was prepared with Dragon dictation along with smaller phrase technology. Any transcriptional errors that result from this process are unintentional.

## 2019-12-21 NOTE — Assessment & Plan Note (Signed)
Patellofemoral syndrome of the right knee noted.  Discussed with patient same dose.  Triple light given, discussing bracing, home exercise, icing regimen and topical anti-inflammatories.  Worsening symptoms will consider formal physical therapy and injections.

## 2019-12-21 NOTE — Patient Instructions (Addendum)
Trupull liteRight knee Pennsaid 2x a day Ice 55minutes Avoid twisting motions See me in 6 weeks

## 2019-12-29 NOTE — Telephone Encounter (Signed)
Call to patient. Patient advised of appointment on 01-03-20 at 1630. Patient agreeable to date and time of appointment.   Encounter closed.

## 2020-01-03 ENCOUNTER — Encounter: Payer: Self-pay | Admitting: Obstetrics and Gynecology

## 2020-01-03 ENCOUNTER — Other Ambulatory Visit: Payer: Self-pay

## 2020-01-03 ENCOUNTER — Ambulatory Visit (INDEPENDENT_AMBULATORY_CARE_PROVIDER_SITE_OTHER): Payer: BC Managed Care – PPO | Admitting: Obstetrics and Gynecology

## 2020-01-03 ENCOUNTER — Other Ambulatory Visit (HOSPITAL_COMMUNITY)
Admission: RE | Admit: 2020-01-03 | Discharge: 2020-01-03 | Disposition: A | Discharge status: Home / Self Care | Payer: BC Managed Care – PPO | Source: Ambulatory Visit | Attending: Obstetrics and Gynecology | Admitting: Obstetrics and Gynecology

## 2020-01-03 VITALS — BP 102/74 | HR 85 | Resp 12 | Ht 64.0 in | Wt 174.5 lb

## 2020-01-03 DIAGNOSIS — Z87411 Personal history of vaginal dysplasia: Secondary | ICD-10-CM | POA: Diagnosis not present

## 2020-01-03 DIAGNOSIS — Z01419 Encounter for gynecological examination (general) (routine) without abnormal findings: Secondary | ICD-10-CM | POA: Diagnosis not present

## 2020-01-03 DIAGNOSIS — Z1272 Encounter for screening for malignant neoplasm of vagina: Secondary | ICD-10-CM

## 2020-01-03 NOTE — Progress Notes (Signed)
54 y.o. [id number] Married White or Caucasian Not Hispanic or Latino female here for annual exam.  H/O hysterectomy.  She is having some hot flashes and mild night sweats.  Not currently sexually active with husbands medical issues.     Husband had an accident in 2/21, slipped on the ice. He had a bad fracture in his leg, has had 2 surgeries. Diagnosed with myasthenia gravis a few months later.    No LMP recorded. Patient has had a hysterectomy.          Sexually active: No.  The current method of family planning is status post hysterectomy.    Exercising: No.  The patient does not participate in regular exercise at present. Smoker:  No- former smoker   Health Maintenance: Pap of vagina:  08/24/18, LGSIL HPV Neg, 07/23/2017 WNL NEG HPV, 03-31-16 LGSIL HPV effect + HR HPV, 08-05-15 LGSIL may include HPV or mild dysplasia History of abnormal Pap:  Yes. Colpo of vaginal apex from 6/20 with negative biopsy MMG:  11/10/18 Density C Bi-rads 1 Neg  BMD:   11/10/18 Normal  Colonoscopy:12-18-11 WNL, f/u 10 years.   TDaP:  06/29/2015  Gardasil: none    reports that she quit smoking about 31 years ago. She has never used smokeless tobacco. She reports current alcohol use of about 1.0 - 2.0 standard drink of alcohol per week. She reports that she does not use drugs. She is a high school Music therapist. 2 kids, 3 grandkids  Past Medical History:  Diagnosis Date  . Allergic urticaria   . ARTHRALGIA   . Arthritis   . Atrial fibrillation ([place])   . B12 DEFICIENCY   . CFS (chronic fatigue syndrome)   . Endometriosis   . Epidural hemorrhage with loss of consciousness ([place]) 06/29/15  . Fibroid   . Fibromyalgia   . Fracture of cervical vertebra, C6 ([place]) 06/29/15  . Gastritis   . GERD   . Heart murmur   . History of endometriosis   . IBS (irritable bowel syndrome)   . Internal hemorrhoids   . MVP (mitral valve prolapse)   . Osteoarthritis   . Rosacea   . Skin cancer of arm, right   . Thoracic compression  fracture ([place]) 06/29/15   T2-T6    Past Surgical History:  Procedure Laterality Date  . ABDOMINAL HYSTERECTOMY     partial  . ABDOMINAL HYSTERECTOMY    . ANAL FISSURE REPAIR     Dr Rise [name] 2009  . APPENDECTOMY    . COLPOSCOPY    . HEMORRHOID SURGERY     Dr. Rise [name] 2009  . OVARIAN CYST REMOVAL    . RT BUNIONECTOMT    . TONSILLECTOMY AND ADENOIDECTOMY    . TUBAL LIGATION    . UTERINE FIBROID SURGERY      Current Outpatient Medications  Medication Sig Dispense Refill  . Ascorbic Acid (VITAMIN C PO) Take by mouth.    . Cholecalciferol (VITAMIN D3) 125 MCG (5000 UT) TABS Take 10,000 Units by mouth daily.    . Multiple Vitamins-Minerals (ZINC PO) Take by mouth.    . Omega-3 Fatty Acids (FISH OIL PO) Take by mouth.    . Probiotic Product (PROBIOTIC PO) Take by mouth.    . SYRINGE-NEEDLE, DISP, 3 ML (BD ECLIPSE SYRINGE) 25G X 1" 3 ML MISC For SQ Vit B12 shots 50 each 3  . Turmeric 500 MG TABS Take by mouth 2 (two) times daily.    Marland Kitchen VITAMIN K PO Take  by mouth.     No current facility-administered medications for this visit.    Family History  Problem Relation Age of Onset  . Lung cancer Mother   . Thyroid cancer Mother   . Cancer Mother        INTESTINAL  . Healthy Father   . Diabetes Other        uncles/aunts  . Von Willebrand disease Daughter   . Asthma Neg Hx     Review of Systems  Gastrointestinal: Positive for nausea.  Endocrine: Positive for heat intolerance.  Allergic/Immunologic:       Enlarged lymph node under chin   Neurological: Positive for headaches.  All other systems reviewed and are negative. Occasional constipation and abdominal discomfort.   Exam:   BP 102/74 (BP Location: Right Arm, Patient Position: Sitting, Cuff Size: Normal)   Pulse 85   Resp 12   Ht 5\' 4"  (1.626 m)   Wt 174 lb 8 oz (79.2 kg)   BMI 29.95 kg/m   Weight change: @WEIGHTCHANGE @ Height:   Height: 5\' 4"  (162.6 cm)  Ht Readings from Last 3 Encounters:  01/03/20 5\' 4"  (1.626  m)  12/21/19 5\' 4"  (1.626 m)  10/24/19 5\' 4"  (1.626 m)    General appearance: alert, cooperative and appears stated age Head: Normocephalic, without obvious abnormality, atraumatic Neck: no adenopathy, supple, symmetrical, trachea midline and thyroid normal to inspection and palpation Lungs: clear to auscultation bilaterally Cardiovascular: regular rate and rhythm Breasts: normal appearance, no masses or tenderness Abdomen: soft, non-tender; non distended,  no masses,  no organomegaly Extremities: extremities normal, atraumatic, no cyanosis or edema Skin: Skin color, texture, turgor normal. No rashes or lesions Lymph nodes: Cervical, supraclavicular, and axillary nodes normal. No abnormal inguinal nodes palpated Neurologic: Grossly normal   Pelvic: External genitalia:  no lesions              Urethra:  normal appearing urethra with no masses, tenderness or lesions              Bartholins and Skenes: normal                 Vagina: normal appearing vagina with normal color and discharge, no lesions              Cervix: absent               Bimanual Exam:  Uterus:  uterus absent              Adnexa: no mass, fullness, tenderness               Rectovaginal: Confirms               Anus:  normal sphincter tone, no lesions  Karmen Bongo chaperoned for the exam.  A:  Well Woman with normal exam  H/o vaginal dysplasia  Vasomotor symptoms, tolerable  P:   Vaginal pap  Mammogram due, she will schedule  Colonoscopy UTD  Discussed perimenopause  Discussed breast self exam  Discussed calcium and vit D intake  Labs with primary

## 2020-01-03 NOTE — Patient Instructions (Signed)

## 2020-01-10 LAB — CYTOLOGY - PAP
Comment: NEGATIVE
High risk HPV: NEGATIVE

## 2020-01-12 ENCOUNTER — Telehealth: Payer: Self-pay

## 2020-01-12 DIAGNOSIS — R87622 Low grade squamous intraepithelial lesion on cytologic smear of vagina (LGSIL): Secondary | ICD-10-CM

## 2020-01-12 NOTE — Telephone Encounter (Signed)
-----   Message from Regina Dom, MD sent at 01/11/2020 12:57 PM EDT ----- Please inform the patient that her pap returned with LSIL, negative for the high risk strains of HPV (doesn't mean she doesn't have hpv). She needs another colposcopy of the vagina. Please schedule.

## 2020-01-12 NOTE — Telephone Encounter (Signed)
Spoke with pt. Pt given results and recommendations per Dr Talbert Nan. Pt agreeable and verbalized understanding. Pt states cannot schedule now due to being at school, will call back to schedule Colpo.   Orders placed

## 2020-01-18 NOTE — Telephone Encounter (Signed)
Spoke with patient regarding benefits for recommended Colposcopy. Patient acknowledges understanding /of information presented. Patient scheduled for 02/15/2020 at 430pm. Patient declined multiple earlier appointment offers. Encounter closed.

## 2020-01-29 ENCOUNTER — Other Ambulatory Visit: Payer: Self-pay | Admitting: Obstetrics and Gynecology

## 2020-01-29 DIAGNOSIS — Z1231 Encounter for screening mammogram for malignant neoplasm of breast: Secondary | ICD-10-CM

## 2020-01-30 NOTE — Progress Notes (Signed)
[place] Phone: [phone] Subjective:   Regina [name], am serving as a scribe for Dr. Hulan Saas. This visit occurred during the SARS-CoV-2 public health emergency.  Safety protocols were in place, including screening questions prior to the visit, additional usage of staff PPE, and extensive cleaning of exam room while observing appropriate contact time as indicated for disinfecting solutions.   I'm seeing this patient by the request  of:  Plotnikov, [name], MD  CC: Right knee pain follow-up  [id number]   12/21/2019 Medial meniscal tear noted on ultrasound today.  We discussed avoiding type of twisting motions.  Because of the patellofemoral patient will be started on different home exercises.  Discussed which activities to doing which wants to avoid.  Patient will avoid cell phone anti-inflammatories.  We discussed topical anti-inflammatories.  Over-the-counter medicines, follow-up again in 4 to 8 weeks possible injection and physical therapy  Patellofemoral syndrome of the right knee noted.  Discussed with patient same dose.  Triple light given, discussing bracing, home exercise, icing regimen and topical anti-inflammatories.  Worsening symptoms will consider formal physical therapy and injections.  Update 01/31/2020 Regina [name] is a 54 y.o. female coming in with complaint of right knee pain. Patient states that she wears brace at work and this helps. Patient states that she does not always wear the brace to work though. Patient is not able to put her leg underneath her backside and sit without pain. Feels like there might be swelling in the joint. Has not been able to do exercises.  Noticing a little more pain in the back of the leg.     Past Medical History:  Diagnosis Date  . Allergic urticaria   . ARTHRALGIA   . Arthritis   . Atrial fibrillation ([place])   . B12 DEFICIENCY   . CFS (chronic  fatigue syndrome)   . Endometriosis   . Epidural hemorrhage with loss of consciousness ([place]) 06/29/15  . Fibroid   . Fibromyalgia   . Fracture of cervical vertebra, C6 ([place]) 06/29/15  . Gastritis   . GERD   . Heart murmur   . History of endometriosis   . IBS (irritable bowel syndrome)   . Internal hemorrhoids   . MVP (mitral valve prolapse)   . Osteoarthritis   . Rosacea   . Skin cancer of arm, right   . Thoracic compression fracture ([place]) 06/29/15   T2-T6   Past Surgical History:  Procedure Laterality Date  . ABDOMINAL HYSTERECTOMY     partial  . ABDOMINAL HYSTERECTOMY    . ANAL FISSURE REPAIR     Dr Rise [name] 2009  . APPENDECTOMY    . COLPOSCOPY    . HEMORRHOID SURGERY     Dr. Rise [name] 2009  . OVARIAN CYST REMOVAL    . RT BUNIONECTOMT    . TONSILLECTOMY AND ADENOIDECTOMY    . TUBAL LIGATION    . UTERINE FIBROID SURGERY     Social History   Socioeconomic History  . Marital status: Married    Spouse name: Not on file  . Number of children: Not on file  . Years of education: Not on file  . Highest education level: Not on file  Occupational History  . Not on file  Tobacco Use  . Smoking status: Former Smoker    Quit date: 10/31/1988    Years since quitting: 31.2  . Smokeless tobacco: Never Used  Vaping Use  .  Vaping Use: Never used  Substance and Sexual Activity  . Alcohol use: Yes    Alcohol/week: 1.0 - 2.0 standard drink    Types: 1 - 2 Standard drinks or equivalent per week    Comment: occasional   . Drug use: [name]  . Sexual activity: Yes    Partners: Male    Birth control/protection: Surgical  Other Topics Concern  . Not on file  Social History Narrative   ** Merged History Encounter **       Working 2 jobs, 6d/wk      Regular exercise-[name]      Divorced   Social Determinants of Radio broadcast assistant Strain:   . Difficulty of Paying Living Expenses: Not on file  Food Insecurity:   . Worried About Charity fundraiser in the Last Year: Not on  file  . Ran Out of Food in the Last Year: Not on file  Transportation Needs:   . Lack of Transportation (Medical): Not on file  . Lack of Transportation (Non-Medical): Not on file  Physical Activity:   . Days of Exercise per Week: Not on file  . Minutes of Exercise per Session: Not on file  Stress:   . Feeling of Stress : Not on file  Social Connections:   . Frequency of Communication with Friends and Family: Not on file  . Frequency of Social Gatherings with Friends and Family: Not on file  . Attends Religious Services: Not on file  . Active Member of Clubs or Organizations: Not on file  . Attends Archivist Meetings: Not on file  . Marital Status: Not on file   Allergies  Allergen Reactions  . Indocin [Indomethacin] Swelling, Rash and Other (See Comments)    REACTION: lupus like illness; butterfly rash, pain and swelling all over body.   . Morphine Sulfate Nausea And Vomiting  . Sulfacetamide Sodium Swelling  . Caffeine Other (See Comments)    Induces A-fib  . Morphine And Related     nausea  . Sulfa Antibiotics Swelling  . Azithromycin Rash    ALL MYCIN DRUGS  . Erythromycin Stearate Hives and Swelling    REACTION: all mycins   Family History  Problem Relation Age of Onset  . Lung cancer Mother   . Thyroid cancer Mother   . Cancer Mother        INTESTINAL  . Healthy Father   . Diabetes Other        uncles/aunts  . Von Willebrand disease Daughter   . Asthma Neg Hx          Current Outpatient Medications (Other):  Marland Kitchen  Ascorbic Acid (VITAMIN C PO), Take by mouth. .  Cholecalciferol (VITAMIN D3) 125 MCG (5000 UT) TABS, Take 10,000 Units by mouth daily. .  Multiple Vitamins-Minerals (ZINC PO), Take by mouth. .  Omega-3 Fatty Acids (FISH OIL PO), Take by mouth. .  Probiotic Product (PROBIOTIC PO), Take by mouth. .  SYRINGE-NEEDLE, DISP, 3 ML (BD ECLIPSE SYRINGE) 25G X 1" 3 ML MISC, For SQ Vit B12 shots .  Turmeric 500 MG TABS, Take by mouth 2 (two)  times daily. Marland Kitchen  VITAMIN K PO, Take by mouth.   Reviewed prior external information including notes and imaging from  primary care provider As well as notes that were available from care everywhere and other healthcare systems.  Past medical history, social, surgical and family history all reviewed in electronic medical record.  [name] pertanent information unless  stated regarding to the chief complaint.   Review of Systems:  [name] headache, visual changes, nausea, vomiting, diarrhea, constipation, dizziness, abdominal pain, skin rash, fevers, chills, night sweats, weight loss, swollen lymph nodes, body aches, joint swelling, chest pain, shortness of breath, mood changes. POSITIVE muscle aches  Objective  Blood pressure 110/72, pulse 74, height 5\' 4"  (1.626 m), weight 177 lb (80.3 kg), SpO2 99 %.   General: [name] apparent distress alert and oriented x3 mood and affect normal, dressed appropriately.  HEENT: Pupils equal, extraocular movements intact  Respiratory: Patient's speak in full sentences and does not appear short of breath  Cardiovascular: [name] lower extremity edema, non tender, [name] erythema  Gait ambulating relatively well MSK:  Right knee exam shows that patient does have some pain of the medial joint space is more in the popliteal area mild calf pain also proximally. Positive McMurray's noted. Full range of motion of the knee though otherwise noted.  Limited musculoskeletal ultrasound was performed and interpreted by Lyndal Pulley  Limited ultrasound of patient's knee shows that patient's medial meniscus does have what appears to be a tear noted with some mild displacement of the posterior medial aspect. [name] effusion noted Impression: Medial meniscal tear with mild displacement posteriorly.    Impression and Recommendations:     The above documentation has been reviewed and is accurate and complete Lyndal Pulley, DO

## 2020-01-31 ENCOUNTER — Ambulatory Visit (INDEPENDENT_AMBULATORY_CARE_PROVIDER_SITE_OTHER): Payer: BC Managed Care – PPO | Admitting: Family Medicine

## 2020-01-31 ENCOUNTER — Encounter: Payer: Self-pay | Admitting: Family Medicine

## 2020-01-31 ENCOUNTER — Other Ambulatory Visit: Payer: Self-pay

## 2020-01-31 VITALS — BP 110/72 | HR 74 | Ht 64.0 in | Wt 177.0 lb

## 2020-01-31 DIAGNOSIS — G8929 Other chronic pain: Secondary | ICD-10-CM | POA: Diagnosis not present

## 2020-01-31 DIAGNOSIS — M25561 Pain in right knee: Secondary | ICD-10-CM

## 2020-01-31 DIAGNOSIS — S83241A Other tear of medial meniscus, current injury, right knee, initial encounter: Secondary | ICD-10-CM | POA: Diagnosis not present

## 2020-01-31 NOTE — Patient Instructions (Addendum)
Good to see you Doppler lower right extremity Labs today 2 baby Asprin until the test PT will call you See me again in 6 weeks. Any shortening of breath or leg pain go to ER

## 2020-01-31 NOTE — Assessment & Plan Note (Addendum)
Patient continues to have signs and symptoms consistent with more of a medial meniscal tear.  We will get x-rays today.  Patient is having pain on the popliteal area that seems to be out of proportion and some pain in the calf.  Doppler ordered today.  We discussed the potential D-dimer.  Patient's Wells score is quite low -1 so feel that empirical treatment with low-dose aspirin is appropriate at this moment... We discussed and tried to schedule the Doppler but unfortunately unable to do that at the moment. We discussed acutely she would need to go to the emergency room but she feels she can wait. Patient will be going to formal physical therapy.  Patient knows if any worsening symptoms such as shortness of breath or chest pain to seek medical attention immediately.  Does have a family history of von Willebrand's factor.  Patient was never had a clot.  Follow-up with me again in 6 weeks for the knee itself.  Patient has had Dopplers of this knee done previously in 2009 and 2018 which had been unremarkable.

## 2020-02-01 LAB — COMPREHENSIVE METABOLIC PANEL
ALT: 12 U/L (ref 0–35)
AST: 14 U/L (ref 0–37)
Albumin: 4.2 g/dL (ref 3.5–5.2)
Alkaline Phosphatase: 58 U/L (ref 39–117)
BUN: 17 mg/dL (ref 6–23)
CO2: 30 mEq/L (ref 19–32)
Calcium: 9.2 mg/dL (ref 8.4–10.5)
Chloride: 105 mEq/L (ref 96–112)
Creatinine, Ser: 0.91 mg/dL (ref 0.40–1.20)
GFR: 71.39 mL/min (ref >60.00)
Glucose, Bld: 73 mg/dL (ref 70–99)
Potassium: 3.8 mEq/L (ref 3.5–5.1)
Sodium: 141 mEq/L (ref 135–145)
Total Bilirubin: 0.4 mg/dL (ref 0.2–1.2)
Total Protein: 6.8 g/dL (ref 6.0–8.3)

## 2020-02-01 LAB — CBC WITH DIFFERENTIAL/PLATELET
Basophils Absolute: 0 10*3/uL (ref 0.0–0.1)
Basophils Relative: 0.4 % (ref 0.0–3.0)
Eosinophils Absolute: 0 10*3/uL (ref 0.0–0.7)
Eosinophils Relative: 0.1 % (ref 0.0–5.0)
HCT: 37.8 % (ref 36.0–46.0)
Hemoglobin: 12.5 g/dL (ref 12.0–15.0)
Lymphocytes Relative: 31.4 % (ref 12.0–46.0)
Lymphs Abs: 2.4 10*3/uL (ref 0.7–4.0)
MCHC: 33 g/dL (ref 30.0–36.0)
MCV: 87.2 fl (ref 78.0–100.0)
Monocytes Absolute: 0.6 10*3/uL (ref 0.1–1.0)
Monocytes Relative: 7.4 % (ref 3.0–12.0)
Neutro Abs: 4.6 10*3/uL (ref 1.4–7.7)
Neutrophils Relative %: 60.7 % (ref 43.0–77.0)
Platelets: 345 10*3/uL (ref 150.0–400.0)
RBC: 4.33 Mil/uL (ref 3.87–5.11)
RDW: 13.2 % (ref 11.5–15.5)
WBC: 7.5 10*3/uL (ref 4.0–10.5)

## 2020-02-01 LAB — D-DIMER, QUANTITATIVE: D-Dimer, Quant: 0.22 mcg/mL FEU (ref <0.50)

## 2020-02-08 ENCOUNTER — Telehealth (INDEPENDENT_AMBULATORY_CARE_PROVIDER_SITE_OTHER): Payer: BC Managed Care – PPO | Admitting: Family Medicine

## 2020-02-08 ENCOUNTER — Other Ambulatory Visit: Payer: Self-pay

## 2020-02-08 DIAGNOSIS — Z8709 Personal history of other diseases of the respiratory system: Secondary | ICD-10-CM

## 2020-02-08 DIAGNOSIS — R0981 Nasal congestion: Secondary | ICD-10-CM

## 2020-02-08 DIAGNOSIS — J040 Acute laryngitis: Secondary | ICD-10-CM | POA: Diagnosis not present

## 2020-02-08 DIAGNOSIS — R059 Cough, unspecified: Secondary | ICD-10-CM | POA: Diagnosis not present

## 2020-02-08 MED ORDER — ALBUTEROL SULFATE HFA 108 (90 BASE) MCG/ACT IN AERS: 2.0000 | INHALATION_SPRAY | Freq: Four times a day (QID) | RESPIRATORY_TRACT | 0 refills | Status: DC | PRN

## 2020-02-08 MED ORDER — BENZONATATE 100 MG PO CAPS: 100.0000 mg | ORAL_CAPSULE | Freq: Three times a day (TID) | ORAL | 0 refills | Status: DC | PRN

## 2020-02-08 NOTE — Patient Instructions (Addendum)
   It was nice to meet you today, and I really hope you are feeling better soon. I help [facility] out with telemedicine visits on Tuesdays and Thursdays and am available for visits on those days. If you have any concerns or questions following this visit please schedule a follow up visit with your Primary Care doctor or seek care at a local urgent care clinic to avoid delays in care.    Seek in person care promptly if your symptoms worsen, new concerns arise or you are not improving with treatment. Call 911 and/or seek emergency care if you symptoms are severe or life threatening.   -stay home while sick, and if you have [place] please stay home for a full 10 days since the onset of symptoms PLUS one day of no fever and feeling better  - COVID19 testing information: [email or web address] OR [phone]  -I sent the medication(s) we discussed to your pharmacy: Meds ordered this encounter  Medications  . albuterol (PROAIR HFA) 108 (90 Base) MCG/ACT inhaler    Sig: Inhale 2 puffs into the lungs every 6 (six) hours as needed for wheezing or shortness of breath.    Dispense:  1 each    Refill:  0  . benzonatate (TESSALON PERLES) 100 MG capsule    Sig: Take 1 capsule (100 mg total) by mouth 3 (three) times daily as needed.    Dispense:  20 capsule    Refill:  0    -call number provided for outpatient COVID19 treatment center [phone]  -can use tylenol or aleve if needed for fevers, aches and pains per instructions  -can use nasal saline a few times per day if nasal congestion, sometime a short course of Afrin nasal spray for 3 days can help as well  -stay hydrated, drink plenty of fluids and eat small healthy meals - avoid dairy  -can take 1000 IU Vit D3 and Vit C lozenges per instructions  -follow up with your doctor in 2-3 days unless improving and feeling better

## 2020-02-08 NOTE — Progress Notes (Signed)
Virtual Visit via Telephone Note  I connected with Regina [name] on 02/08/20 at 11:40 AM EST by telephone and verified that I am speaking with the correct person using two identifiers.   I discussed the limitations, risks, security and privacy concerns of performing an evaluation and management service by telephone and the availability of in person appointments. I also discussed with the patient that there may be a patient responsible charge related to this service. The patient expressed understanding and agreed to proceed.  Location patient: home, [place] Location provider: work or home office Participants present for the call: patient, provider Patient did not have a visit with me in the prior 7 days to address this/these issue(s).   History of Present Illness:  Acute telemedicine visit for "sinus infection": -Onset: worse the last 4 days ago -Symptoms include: nasal congestion, laryngitis, cough -Denies:fevers, SOB, CP, NVD -no known sick contacts -Has tried: benadryl, cough drops -Pertinent past medical history: history of bronchitis - has used inhaler in the past when has this -Pertinent medication allergies: seasonal allergies -COVID-19 vaccine status:not vaccinated for flu or covid19   Observations/Objective: Patient sounds cheerful and well on the phone. I do not appreciate any SOB. Speech and thought processing are grossly intact. Patient reported vitals:  Assessment and Plan:  Cough  Nasal congestion  Laryngitis  History of bronchitis  -we discussed possible serious and likely etiologies, options for evaluation and workup, limitations of telemedicine visit vs in person visit, treatment, treatment risks and precautions. Pt prefers to treat via telemedicine empirically rather than in person at this moment.  Query viral upper respiratory infection, COVID-19 versus other.  Given her history of inhaler use in the past, refilled her albuterol in case it is needed.  Sent  Tessalon for cough.  Discussed options for Covid testing, potential complications, precautions and staying home while sick.  She was interested in Covid treatment if it is positive, discussed options and provided treatment center number per her request in case her Covid test results return on the weekend.  Also advised that she notify her PCP if positive. Work/School slipped offered: declined Scheduled follow up with PCP offered: She agrees to follow-up if needed. Advised to seek prompt in person care if worsening, new symptoms arise, or if is not improving with treatment. Advised of options for inperson care in case PCP office not available. Did let the patient know that I only do telemedicine shifts for [facility] on Tuesdays and Thursdays and advised a follow up visit with PCP or at an [facility] if has further questions or concerns.   Follow Up Instructions:  I did not refer this patient for an OV with me in the next 24 hours for this/these issue(s).  I discussed the assessment and treatment plan with the patient. The patient was provided an opportunity to ask questions and all were answered. The patient agreed with the plan and demonstrated an understanding of the instructions.   I spent 21 minutes on this encounter.   Lucretia Kern, DO

## 2020-02-09 ENCOUNTER — Ambulatory Visit: Payer: BC Managed Care – PPO

## 2020-02-13 ENCOUNTER — Telehealth (INDEPENDENT_AMBULATORY_CARE_PROVIDER_SITE_OTHER): Payer: BC Managed Care – PPO | Admitting: Internal Medicine

## 2020-02-13 DIAGNOSIS — J069 Acute upper respiratory infection, unspecified: Secondary | ICD-10-CM | POA: Diagnosis not present

## 2020-02-13 DIAGNOSIS — J4520 Mild intermittent asthma, uncomplicated: Secondary | ICD-10-CM | POA: Diagnosis not present

## 2020-02-13 DIAGNOSIS — J3089 Other allergic rhinitis: Secondary | ICD-10-CM

## 2020-02-13 MED ORDER — LORATADINE 10 MG PO TABS: 10.0000 mg | ORAL_TABLET | Freq: Every day | ORAL | 5 refills | Status: DC

## 2020-02-13 MED ORDER — AMOXICILLIN 875 MG PO TABS: 875.0000 mg | ORAL_TABLET | Freq: Two times a day (BID) | ORAL | 0 refills | Status: DC

## 2020-02-13 MED ORDER — METHYLPREDNISOLONE 4 MG PO TBPK: ORAL_TABLET | ORAL | 0 refills | Status: DC

## 2020-02-15 ENCOUNTER — Encounter: Payer: Self-pay | Admitting: Obstetrics and Gynecology

## 2020-02-15 ENCOUNTER — Ambulatory Visit: Payer: BC Managed Care – PPO | Admitting: Obstetrics and Gynecology

## 2020-02-15 ENCOUNTER — Other Ambulatory Visit (HOSPITAL_COMMUNITY)
Admission: RE | Admit: 2020-02-15 | Discharge: 2020-02-15 | Disposition: A | Discharge status: Home / Self Care | Payer: BC Managed Care – PPO | Source: Ambulatory Visit | Attending: Obstetrics and Gynecology | Admitting: Obstetrics and Gynecology

## 2020-02-15 ENCOUNTER — Other Ambulatory Visit: Payer: Self-pay

## 2020-02-15 DIAGNOSIS — R87622 Low grade squamous intraepithelial lesion on cytologic smear of vagina (LGSIL): Secondary | ICD-10-CM | POA: Diagnosis present

## 2020-02-15 NOTE — Patient Instructions (Signed)

## 2020-02-15 NOTE — Progress Notes (Signed)
GYNECOLOGY  VISIT   HPI: 54 y.o.   Married White or Caucasian Not Hispanic or Latino  female   [phone] with No LMP recorded. Patient has had a hysterectomy.   here for evaluation of an abnormal pap smear. H/O hysterectomy recent pap with LSIL, negative HPV of her vaginal apex.     In 2018 pap LSIL, +HPV Colposcopy in 2018 with hpv effect, no dysplasia. Pap in 2019 was negative with negative hpv Pap in 5/20 with LSIL, negative hpv Colposcopy in 6/20 negative biopsy  GYNECOLOGIC HISTORY: No LMP recorded. Patient has had a hysterectomy. Contraception:hysterectomy Menopausal hormone therapy: none        OB History    Gravida  3   Para  2   Term  2   Preterm  0   AB  1   Living  2     SAB  1   TAB  0   Ectopic  0   Multiple      Live Births  2              Patient Active Problem List   Diagnosis Date Noted  . Patellofemoral syndrome of right knee 12/21/2019  . Acute medial meniscal tear, right, initial encounter 12/21/2019  . Stress at home 10/24/2019  . Wrist tendonitis 10/24/2019  . Vitamin D deficiency 05/03/2019  . Multiple thyroid nodules 03/08/2019  . Oropharyngeal dysphagia 03/08/2019  . Throat pain 02/14/2019  . Constipation 12/26/2018  . Abdominal pain 12/26/2018  . Brachial neuritis of right upper extremity 07/06/2018  . Well adult exam 03/16/2018  . Obesity 03/16/2018  . Conjunctivitis 07/14/2017  . Foreign body of right ear 12/01/2016  . Sweats, menopausal 10/20/2016  . Panic attacks 10/20/2016  . Loss of transverse plantar arch of right foot 09/07/2016  . Other bursal cyst of wrist 08/11/2016  . Posterior right knee pain 07/16/2016  . Knee pain, right 05/21/2016  . Hamstring strain, sequela 05/21/2016  . Wart viral 05/12/2016  . Contusion of knee 05/05/2016  . Fall involving sidewalk curb 05/05/2016  . Aphasia 03/13/2016  . Shoulder pain, bilateral 12/11/2015  . Fibromyalgia 12/11/2015  . Positional vertigo 11/15/2015  . Post  concussion syndrome 11/01/2015  . Clavicle fracture 10/30/2015  . Hair loss 10/11/2015  . Cervical radicular pain 09/20/2015  . Pain in joint, shoulder region 09/11/2015  . Right wrist pain 08/07/2015  . Splinter in skin 07/30/2015  . Fall from ladder 07/30/2015  . Skull fracture with concussion ([place]) 07/30/2015  . Fracture of cervical vertebra, C6 ([place]) 07/30/2015  . Thoracic vertebral fracture ([place]) 07/30/2015  . Closed fracture of base of fourth metacarpal bone of left hand 06/30/2015  . Laceration of hand, left 06/30/2015  . Abdominal pain, epigastric 05/07/2015  . Scaly patch rash 02/26/2015  . Mitral valve regurgitation 09/27/2014  . PAT (paroxysmal atrial tachycardia) ([place]) 09/06/2014  . Tachycardia 07/31/2014  . Sprain of ankle 06/29/2014  . Earache, left 05/15/2013  . Dizziness 05/15/2013  . Asthmatic bronchitis 05/08/2013  . Benign paroxysmal positional vertigo 05/08/2013  . Chest wall pain 01/26/2013  . LUQ abdominal pain 01/20/2013  . Insomnia 01/10/2013  . Depression (emotion) 01/10/2013  . URI, acute 08/25/2012  . Snoring 08/25/2012  . IBS (irritable bowel syndrome) 02/08/2012  . Multiple pulmonary nodules 02/08/2012  . Chest heaviness 11/16/2010  . GERD 05/24/2010  . SINUSITIS, MAXILLARY, CHRONIC 05/20/2010  . Myalgia and myositis 11/20/2009  . Fatigue 11/20/2009  . ARTHRALGIA 06/26/2009  . Headache  06/26/2009  . TOBACCO USE, QUIT 06/26/2009  . LOW BACK PAIN 02/20/2009  . Rosacea 12/27/2008  . PRURITUS 12/27/2008  . Allergic urticaria 12/27/2008  . B12 deficiency 01/02/2008    Past Medical History:  Diagnosis Date  . Allergic urticaria   . ARTHRALGIA   . Arthritis   . Atrial fibrillation ([place])   . B12 DEFICIENCY   . CFS (chronic fatigue syndrome)   . Endometriosis   . Epidural hemorrhage with loss of consciousness ([place]) 06/29/15  . Fibroid   . Fibromyalgia   . Fracture of cervical vertebra, C6 ([place]) 06/29/15  . Gastritis   . GERD   . Heart  murmur   . History of endometriosis   . IBS (irritable bowel syndrome)   . Internal hemorrhoids   . MVP (mitral valve prolapse)   . Osteoarthritis   . Rosacea   . Skin cancer of arm, right   . Thoracic compression fracture ([place]) 06/29/15   T2-T6    Past Surgical History:  Procedure Laterality Date  . ABDOMINAL HYSTERECTOMY     partial  . ABDOMINAL HYSTERECTOMY    . ANAL FISSURE REPAIR     Dr Rise [name] 2009  . APPENDECTOMY    . COLPOSCOPY    . HEMORRHOID SURGERY     Dr. Rise [name] 2009  . OVARIAN CYST REMOVAL    . RT BUNIONECTOMT    . TONSILLECTOMY AND ADENOIDECTOMY    . TUBAL LIGATION    . UTERINE FIBROID SURGERY      Current Outpatient Medications  Medication Sig Dispense Refill  . Ascorbic Acid (VITAMIN C PO) Take by mouth.    . benzonatate (TESSALON PERLES) 100 MG capsule Take 1 capsule (100 mg total) by mouth 3 (three) times daily as needed. 20 capsule 0  . Cholecalciferol (VITAMIN D3) 125 MCG (5000 UT) TABS Take 10,000 Units by mouth daily.    . cyanocobalamin (,VITAMIN B-12,) 1000 MCG/ML injection Inject into the muscle.    . loratadine (CLARITIN) 10 MG tablet Take 1 tablet (10 mg total) by mouth daily. 90 tablet 5  . Multiple Vitamins-Minerals (ZINC PO) Take by mouth.    . Omega-3 Fatty Acids (FISH OIL PO) Take by mouth.    . Probiotic Product (PROBIOTIC PO) Take by mouth.    . SYRINGE-NEEDLE, DISP, 3 ML (BD ECLIPSE SYRINGE) 25G X 1" 3 ML MISC For SQ Vit B12 shots 50 each 3  . Turmeric 500 MG TABS Take by mouth 2 (two) times daily.    [facility] Kitchen UNABLE TO FIND Immunity booster    . VITAMIN K PO Take by mouth.    Mar[place] Kitchen albuterol (PROAIR HFA) 108 (90 Base) MCG/ACT inhaler Inhale 2 puffs into the lungs every 6 (six) hours as needed for wheezing or shortness of breath. (Patient not taking: Reported on 02/15/2020) 1 each 0  . amoxicillin (AMOXIL) 875 MG tablet Take 1 tablet (875 mg total) by mouth 2 (two) times daily. (Patient not taking: Reported on 02/15/2020) 20 tablet 0  .  methylPREDNISolone (MEDROL DOSEPAK) 4 MG TBPK tablet As directed (Patient not taking: Reported on 02/15/2020) 21 tablet 0   No current facility-administered medications for this visit.     ALLERGIES: Indocin [indomethacin], Morphine sulfate, Sulfacetamide sodium, Caffeine, Morphine and related, Sulfa antibiotics, Azithromycin, and Erythromycin stearate  Family History  Problem Relation Age of Onset  . Lung cancer Mother   . Thyroid cancer Mother   . Cancer Mother        INTESTINAL  .  Healthy Father   . Diabetes Other        uncles/aunts  . Von Willebrand disease Daughter   . Asthma Neg Hx     Social History   Socioeconomic History  . Marital status: Married    Spouse name: Not on file  . Number of children: Not on file  . Years of education: Not on file  . Highest education level: Not on file  Occupational History  . Not on file  Tobacco Use  . Smoking status: Former Smoker    Quit date: 10/31/1988    Years since quitting: 31.3  . Smokeless tobacco: Never Used  Vaping Use  . Vaping Use: Never used  Substance and Sexual Activity  . Alcohol use: Yes    Alcohol/week: 1.0 - 2.0 standard drink    Types: 1 - 2 Standard drinks or equivalent per week    Comment: occasional   . Drug use: No  . Sexual activity: Yes    Partners: Male    Birth control/protection: Surgical    Comment: hysterectomy  Other Topics Concern  . Not on file  Social History Narrative   ** Merged History Encounter **       Working 2 jobs, 6d/wk      Regular exercise-no      Divorced   Social Determinants of Radio broadcast assistant Strain:   . Difficulty of Paying Living Expenses: Not on file  Food Insecurity:   . Worried About Charity fundraiser in the Last Year: Not on file  . Ran Out of Food in the Last Year: Not on file  Transportation Needs:   . Lack of Transportation (Medical): Not on file  . Lack of Transportation (Non-Medical): Not on file  Physical Activity:   . Days of  Exercise per Week: Not on file  . Minutes of Exercise per Session: Not on file  Stress:   . Feeling of Stress : Not on file  Social Connections:   . Frequency of Communication with Friends and Family: Not on file  . Frequency of Social Gatherings with Friends and Family: Not on file  . Attends Religious Services: Not on file  . Active Member of Clubs or Organizations: Not on file  . Attends Archivist Meetings: Not on file  . Marital Status: Not on file  Intimate Partner Violence:   . Fear of Current or Ex-Partner: Not on file  . Emotionally Abused: Not on file  . Physically Abused: Not on file  . Sexually Abused: Not on file    ROS  PHYSICAL EXAMINATION:    BP 110/72   Pulse 68   Resp 16   Wt 176 lb (79.8 kg)   BMI 30.21 kg/m     General appearance: alert, cooperative and appears stated age  Pelvic: External genitalia:  no lesions              Urethra:  normal appearing urethra with no masses, tenderness or lesions              Bartholins and Skenes: normal                 Vagina: normal appearing vagina with normal color and discharge, no lesions              Cervix: absent   Colposcopy: no aceto white changes of the vagina. Decreased lugols uptake of the vagina at the apex at 3 o'clock, biopsy taken, majority of  area removed. No other abnormalities of the vagina.               Chaperone was present for exam.  ASSESSMENT LSIL pap of vagina, negative hpv    PLAN Vaginal biopsy done

## 2020-02-19 ENCOUNTER — Encounter: Payer: Self-pay | Admitting: Internal Medicine

## 2020-02-19 DIAGNOSIS — J309 Allergic rhinitis, unspecified: Secondary | ICD-10-CM | POA: Insufficient documentation

## 2020-02-19 LAB — SURGICAL PATHOLOGY

## 2020-02-19 NOTE — Assessment & Plan Note (Signed)
Worse.  Add prednisone taper

## 2020-02-19 NOTE — Assessment & Plan Note (Signed)
Worse.  Start amoxicillin

## 2020-02-19 NOTE — Progress Notes (Signed)
Virtual Visit via Video Note  I connected with Regina [name] on 02/19/20 at  3:00 PM EST by a video enabled telemedicine application and verified that I am speaking with the correct person using two identifiers.   I discussed the limitations of evaluation and management by telemedicine and the availability of in person appointments. The patient expressed understanding and agreed to proceed.  I was located at our [facility] office. The patient was at home. There was no one else present in the visit.   History of Present Illness: We need to follow-up on URI sx's, congestion and cough not better after a few days of over-the-counter meds.  There has been  runny nose, cough, chest pain.  No shortness of breath, abdominal pain, diarrhea, constipation,skin rashes.   Observations/Objective: The patient appears to be in no acute distress, looks tired.  Assessment and Plan:  See my Assessment and Plan. Follow Up Instructions:    I discussed the assessment and treatment plan with the patient. The patient was provided an opportunity to ask questions and all were answered. The patient agreed with the plan and demonstrated an understanding of the instructions.   The patient was advised to call back or seek an in-person evaluation if the symptoms worsen or if the condition fails to improve as anticipated.  I provided face-to-face time during this encounter. We were at different locations.   Walker Kehr, MD

## 2020-02-19 NOTE — Assessment & Plan Note (Signed)
Start Claritin daily

## 2020-02-28 ENCOUNTER — Telehealth: Payer: Self-pay

## 2020-02-28 NOTE — Telephone Encounter (Signed)
Left message to call Angelamarie Avakian, CMA. °

## 2020-02-28 NOTE — Telephone Encounter (Signed)
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 She needs a pap in one year.

## 2020-03-18 NOTE — Progress Notes (Signed)
[facility] [address] [place] Phone: [phone] Subjective:   I Regina [name] am serving as a Education administrator for Dr. Hulan Saas.  This visit occurred during the SARS-CoV-2 public health emergency.  Safety protocols were in place, including screening questions prior to the visit, additional usage of staff PPE, and extensive cleaning of exam room while observing appropriate contact time as indicated for disinfecting solutions.   I'm seeing this patient by the request  of:  Plotnikov, [name], MD  CC: Right knee pain follow-up  [id number]   01/31/2020 Patient continues to have signs and symptoms consistent with more of a medial meniscal tear.  We will get x-rays today.  Patient is having pain on the popliteal area that seems to be out of proportion and some pain in the calf.  Doppler ordered today.  We discussed the potential D-dimer.  Patient's Wells score is quite low -1 so feel that empirical treatment with low-dose aspirin is appropriate at this moment... We discussed and tried to schedule the Doppler but unfortunately unable to do that at the moment. We discussed acutely she would need to go to the emergency room but she feels she can wait. Patient will be going to formal physical therapy.  Patient knows if any worsening symptoms such as shortness of breath or chest pain to seek medical attention immediately.  Does have a family history of von Willebrand's factor.  Patient was never had a clot.  Follow-up with me again in 6 weeks for the knee itself.  Patient has had Dopplers of this knee done previously in 2009 and 2018 which had been unremarkable.   Update 03/18/2020 Regina [name] is a 54 y.o. female coming in with complaint of right knee pain. Patient states she is not too bad today. Believes she is starting to make progress.  Patient would state probably 60% better overall.  Patient did have the Doppler done that was unremarkable.   Feels that physical therapy has been beneficial.  Patient is complaining of some low back pain.  Has had this for quite some time as well.  Been a chronic problem, but seems to be more potentially at the base of her spine.  Possibly since her knee injury has worsened.  Some very intermittent radiation that can occur down to her foot.  On the right side.    Past Medical History:  Diagnosis Date  . Allergic urticaria   . ARTHRALGIA   . Arthritis   . Atrial fibrillation ([place])   . B12 DEFICIENCY   . CFS (chronic fatigue syndrome)   . Endometriosis   . Epidural hemorrhage with loss of consciousness ([place]) 06/29/15  . Fibroid   . Fibromyalgia   . Fracture of cervical vertebra, C6 ([place]) 06/29/15  . Gastritis   . GERD   . Heart murmur   . History of endometriosis   . IBS (irritable bowel syndrome)   . Internal hemorrhoids   . MVP (mitral valve prolapse)   . Osteoarthritis   . Rosacea   . Skin cancer of arm, right   . Thoracic compression fracture ([place]) 06/29/15   T2-T6   Past Surgical History:  Procedure Laterality Date  . ABDOMINAL HYSTERECTOMY     partial  . ABDOMINAL HYSTERECTOMY    . ANAL FISSURE REPAIR     Dr Rise [name] 2009  . APPENDECTOMY    . COLPOSCOPY    . HEMORRHOID SURGERY     Dr. Rise [name] 2009  .  OVARIAN CYST REMOVAL    . RT BUNIONECTOMT    . TONSILLECTOMY AND ADENOIDECTOMY    . TUBAL LIGATION    . UTERINE FIBROID SURGERY     Social History   Socioeconomic History  . Marital status: Married    Spouse name: Not on file  . Number of children: Not on file  . Years of education: Not on file  . Highest education level: Not on file  Occupational History  . Not on file  Tobacco Use  . Smoking status: Former Smoker    Quit date: 10/31/1988    Years since quitting: 31.4  . Smokeless tobacco: Never Used  Vaping Use  . Vaping Use: Never used  Substance and Sexual Activity  . Alcohol use: Yes    Alcohol/week: 1.0 - 2.0 standard drink    Types: 1 - 2 Standard drinks  or equivalent per week    Comment: occasional   . Drug use: No  . Sexual activity: Yes    Partners: Male    Birth control/protection: Surgical    Comment: hysterectomy  Other Topics Concern  . Not on file  Social History Narrative   ** Merged History Encounter **       Working 2 jobs, 6d/wk      Regular exercise-no      Divorced   Social Determinants of Radio broadcast assistant Strain: Not on file  Food Insecurity: Not on file  Transportation Needs: Not on file  Physical Activity: Not on file  Stress: Not on file  Social Connections: Not on file   Allergies  Allergen Reactions  . Indocin [Indomethacin] Swelling, Rash and Other (See Comments)    REACTION: lupus like illness; butterfly rash, pain and swelling all over body.   . Morphine Sulfate Nausea And Vomiting  . Sulfacetamide Sodium Swelling  . Caffeine Other (See Comments)    Induces A-fib  . Morphine And Related     nausea  . Sulfa Antibiotics Swelling  . Azithromycin Rash    ALL MYCIN DRUGS  . Erythromycin Stearate Hives and Swelling    REACTION: all mycins   Family History  Problem Relation Age of Onset  . Lung cancer Mother   . Thyroid cancer Mother   . Cancer Mother        INTESTINAL  . Healthy Father   . Diabetes Other        uncles/aunts  . Von Willebrand disease Daughter   . Asthma Neg Hx     Current Outpatient Medications (Endocrine & Metabolic):  .  methylPREDNISolone (MEDROL DOSEPAK) 4 MG TBPK tablet, As directed   Current Outpatient Medications (Respiratory):  .  albuterol (PROAIR HFA) 108 (90 Base) MCG/ACT inhaler, Inhale 2 puffs into the lungs every 6 (six) hours as needed for wheezing or shortness of breath. .  benzonatate (TESSALON PERLES) 100 MG capsule, Take 1 capsule (100 mg total) by mouth 3 (three) times daily as needed. .  loratadine (CLARITIN) 10 MG tablet, Take 1 tablet (10 mg total) by mouth daily.   Current Outpatient Medications (Hematological):  .  cyanocobalamin  (,VITAMIN B-12,) 1000 MCG/ML injection, Inject into the muscle.  Current Outpatient Medications (Other):  .  amoxicillin (AMOXIL) 875 MG tablet, Take 1 tablet (875 mg total) by mouth 2 (two) times daily. .  Ascorbic Acid (VITAMIN C PO), Take by mouth. .  Cholecalciferol (VITAMIN D3) 125 MCG (5000 UT) TABS, Take 10,000 Units by mouth daily. .  Multiple Vitamins-Minerals (ZINC PO),  Take by mouth. .  Omega-3 Fatty Acids (FISH OIL PO), Take by mouth. .  Probiotic Product (PROBIOTIC PO), Take by mouth. .  SYRINGE-NEEDLE, DISP, 3 ML (BD ECLIPSE SYRINGE) 25G X 1" 3 ML MISC, For SQ Vit B12 shots .  Turmeric 500 MG TABS, Take by mouth 2 (two) times daily. Marland Kitchen  UNABLE TO FIND, Immunity booster .  VITAMIN K PO, Take by mouth.   Reviewed prior external information including notes and imaging from  primary care provider As well as notes that were available from care everywhere and other healthcare systems.  Past medical history, social, surgical and family history all reviewed in electronic medical record.  No pertanent information unless stated regarding to the chief complaint.   Review of Systems:  No headache, visual changes, nausea, vomiting, diarrhea, constipation, dizziness, abdominal pain, skin rash, fevers, chills, night sweats, weight loss, swollen lymph nodes, body aches, joint swelling, chest pain, shortness of breath, mood changes. POSITIVE muscle aches, body aches  Objective  Blood pressure 110/78, pulse 67, height 5\' 4"  (1.626 m), weight 173 lb (78.5 kg), SpO2 98 %.   General: No apparent distress alert and oriented x3 mood and affect normal, dressed appropriately.  HEENT: Pupils equal, extraocular movements intact  Respiratory: Patient's speak in full sentences and does not appear short of breath  Cardiovascular: No lower extremity edema, non tender, no erythema  Gait normal with good balance and coordination.  MSK: Right knee exam still shows a very mild positive McMurray's.  Minimal  tenderness over the medial joint line.  Full range of motion of the knee otherwise.  No significant instability. Low back exam does have some mild loss of lordosis.  Negative straight leg test.  Does have tightness noted with the straight leg test.  Negative FABER test.  Neurovascularly intact distally.  5 out of 5 strength in lower extremity   Impression and Recommendations:     The above documentation has been reviewed and is accurate and complete Lyndal Pulley, DO

## 2020-03-19 ENCOUNTER — Encounter: Payer: Self-pay | Admitting: Family Medicine

## 2020-03-19 ENCOUNTER — Other Ambulatory Visit: Payer: Self-pay

## 2020-03-19 ENCOUNTER — Ambulatory Visit (INDEPENDENT_AMBULATORY_CARE_PROVIDER_SITE_OTHER): Payer: BC Managed Care – PPO | Admitting: Family Medicine

## 2020-03-19 ENCOUNTER — Telehealth: Payer: Self-pay

## 2020-03-19 VITALS — BP 110/78 | HR 67 | Ht 64.0 in | Wt 173.0 lb

## 2020-03-19 DIAGNOSIS — M545 Low back pain, unspecified: Secondary | ICD-10-CM | POA: Diagnosis not present

## 2020-03-19 DIAGNOSIS — M797 Fibromyalgia: Secondary | ICD-10-CM | POA: Diagnosis not present

## 2020-03-19 DIAGNOSIS — G8929 Other chronic pain: Secondary | ICD-10-CM

## 2020-03-19 NOTE — Assessment & Plan Note (Signed)
Patient does have a fibromyalgia that could be contributing as well to some of the aches and pains that seems to be out of proportion

## 2020-03-19 NOTE — Patient Instructions (Addendum)
Arnica lotion Continue PT See me in 6 weeks

## 2020-03-19 NOTE — Telephone Encounter (Signed)
Called patient to let her know we need xrays of her back and knee before next visit.

## 2020-03-19 NOTE — Assessment & Plan Note (Signed)
Patient has had chronic pain previously.  We will get x-rays today.  Seems to be more sacroiliac.  Could be her compensating more for the knee as well as her foot she states.  Discussed icing regimen, home exercise, discussed topical anti-inflammatories.  Follow-up with me again in 4 to 8 weeks

## 2020-03-20 ENCOUNTER — Ambulatory Visit
Admission: RE | Admit: 2020-03-20 | Discharge: 2020-03-20 | Disposition: A | Discharge status: Home / Self Care | Payer: BC Managed Care – PPO | Source: Ambulatory Visit | Attending: Obstetrics and Gynecology | Admitting: Obstetrics and Gynecology

## 2020-03-20 ENCOUNTER — Other Ambulatory Visit: Payer: Self-pay

## 2020-03-20 DIAGNOSIS — Z1231 Encounter for screening mammogram for malignant neoplasm of breast: Secondary | ICD-10-CM

## 2020-03-25 ENCOUNTER — Ambulatory Visit (INDEPENDENT_AMBULATORY_CARE_PROVIDER_SITE_OTHER): Payer: BC Managed Care – PPO

## 2020-03-25 DIAGNOSIS — G8929 Other chronic pain: Secondary | ICD-10-CM | POA: Diagnosis not present

## 2020-03-25 DIAGNOSIS — M545 Low back pain, unspecified: Secondary | ICD-10-CM

## 2020-04-04 ENCOUNTER — Other Ambulatory Visit: Payer: Self-pay

## 2020-04-04 DIAGNOSIS — Z20822 Contact with and (suspected) exposure to covid-19: Secondary | ICD-10-CM

## 2020-04-06 LAB — NOVEL CORONAVIRUS, NAA: SARS-CoV-2, NAA: NOT DETECTED

## 2020-04-06 LAB — SARS-COV-2, NAA 2 DAY TAT

## 2020-04-08 ENCOUNTER — Other Ambulatory Visit: Payer: Self-pay

## 2020-04-08 ENCOUNTER — Encounter: Payer: Self-pay | Admitting: Family Medicine

## 2020-04-08 ENCOUNTER — Ambulatory Visit (INDEPENDENT_AMBULATORY_CARE_PROVIDER_SITE_OTHER): Payer: BC Managed Care – PPO | Admitting: Family Medicine

## 2020-04-08 VITALS — BP 114/80 | HR 62 | Temp 97.6°F | Resp 18 | Ht 64.0 in | Wt 175.0 lb

## 2020-04-08 DIAGNOSIS — J302 Other seasonal allergic rhinitis: Secondary | ICD-10-CM | POA: Diagnosis not present

## 2020-04-08 DIAGNOSIS — R0600 Dyspnea, unspecified: Secondary | ICD-10-CM | POA: Diagnosis not present

## 2020-04-08 DIAGNOSIS — M25532 Pain in left wrist: Secondary | ICD-10-CM | POA: Diagnosis not present

## 2020-04-08 DIAGNOSIS — G8929 Other chronic pain: Secondary | ICD-10-CM | POA: Diagnosis not present
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
## 2020-04-08 NOTE — Patient Instructions (Signed)
-  for prophylactic covid 1) vitamin D3: 2000IU/day 2) zinc: 20mg /day 3) quercetin: 250mg /day 4) vitamin C: 500mg /day 5) melatonin: 6mg /day  If you get covid  1) vitamin D3: 5000iu/day 2) vitamin C: 1000mg  three times a day 3) zinc: 100mg /day 4) quercetin: 500mg  twice a day 5) melatonin 10mg /day  I would add on prozac 30mg /day.  Mouth wash three times a day Get 1% iodine drops as well.   [phone] if you get covid. (cell)   Id do trial of flonase for post nasal drip as well.  Try cool mist humidifier as well.    Valere Dross &amp; Nadel's Textbook of Respiratory Medicine (7th ed., pp. [phone]). Elsevier.">  Postnasal Drip Postnasal drip is the feeling of mucus going down the back of your throat. Mucus is a slimy substance that moistens and cleans your nose and throat, as well as the air pockets in face bones near your forehead and cheeks (sinuses). Small amounts of mucus pass from your nose and sinuses down the back of your throat all the time. This is normal. When you produce too much mucus or the mucus gets too thick, you can feel it. Some common causes of postnasal drip include:  Having more mucus because of: ? A cold or the flu. ? Allergies. ? Cold air. ? Certain medicines.  Having more mucus that is thicker because of: ? A sinus or nasal infection. ? Dry air. ? A food allergy. Follow these instructions at home: Relieving discomfort  Gargle with a salt-water mixture 3-4 times a day or as needed. To make a salt-water mixture, completely dissolve -1 tsp of salt in 1 cup of warm water.  If the air in your home is dry, use a humidifier to add moisture to the air.  Use a saline spray or container (neti pot) to flush out the nose (nasal irrigation). These methods can help clear away mucus and keep the nasal passages moist.   General instructions  Take over-the-counter and prescription medicines only as told by your health care provider.  Follow instructions from your  health care provider about eating or drinking restrictions. You may need to avoid caffeine.  Avoid things that you know you are allergic to (allergens), like dust, mold, pollen, pets, or certain foods.  Drink enough fluid to keep your urine pale yellow.  Keep all follow-up visits as told by your health care provider. This is important. Contact a health care provider if:  You have a fever.  You have a sore throat.  You have difficulty swallowing.  You have headache.  You have sinus pain.  You have a cough that does not go away.  The mucus from your nose becomes thick and is green or yellow in color.  You have cold or flu symptoms that last more than 10 days. Summary  Postnasal drip is the feeling of mucus going down the back of your throat.  If your health care provider approves, use nasal irrigation or a nasal spray 2?4 times a day.  Avoid things that you know you are allergic to (allergens), like dust, mold, pollen, pets, or certain foods. This information is not intended to replace advice given to you by your health care provider. Make sure you discuss any questions you have with your health care provider. Document Revised: 12/26/2019 Document Reviewed: 12/26/2019 Elsevier Patient Education  2021 [employer].

## 2020-04-22 NOTE — Telephone Encounter (Signed)
Tried calling patient again to notify of biopsy results. Mailbox is full and unable to leave a message. She is in pap recall for 11/2020.  Letter mailed to patient with results and to call if questions.

## 2020-04-26 ENCOUNTER — Encounter: Payer: Self-pay | Admitting: Family Medicine

## 2020-04-26 ENCOUNTER — Telehealth (INDEPENDENT_AMBULATORY_CARE_PROVIDER_SITE_OTHER): Payer: BC Managed Care – PPO | Admitting: Family Medicine

## 2020-04-26 VITALS — Ht 64.0 in | Wt 175.0 lb

## 2020-04-26 DIAGNOSIS — J0101 Acute recurrent maxillary sinusitis: Secondary | ICD-10-CM | POA: Diagnosis not present

## 2020-04-26 MED ORDER — MONTELUKAST SODIUM 10 MG PO TABS: 10.0000 mg | ORAL_TABLET | Freq: Every day | ORAL | 1 refills | Status: DC

## 2020-04-26 MED ORDER — AMOXICILLIN-POT CLAVULANATE 875-125 MG PO TABS: 1.0000 | ORAL_TABLET | Freq: Two times a day (BID) | ORAL | 0 refills | Status: DC

## 2020-04-26 NOTE — Progress Notes (Signed)
Patient: Regina [name] MRN: [id number] DOB: 1966-01-11 PCP: Cassandria Anger, MD     I connected with Viviano Simas on 04/26/20 at 11:17am by a video enabled telemedicine application and verified that I am speaking with the correct person using two identifiers.  Location patient: Home Location provider: [facility], Office Persons participating in this virtual visit: lucy Bilotta and dr. Rogers Blocker   I discussed the limitations of evaluation and management by telemedicine and the availability of in person appointments. The patient expressed understanding and agreed to proceed.   Interactive audio and video telecommunications were attempted between this provider and patient, however failed, due to patient having technical difficulties OR patient did not have access to video capability.  We continued and completed visit with audio only.    Subjective:  Chief Complaint  Patient presents with  . Cough  . Headache  . Sinusitis    Symptoms started Sunday.  She has taken Benadryl, but says that it has not subsided. Negative at home Covid test-Twice    HPI: The patient is a 55 y.o. female who presents today for URI and sinus type symptoms. She states symptoms started Sunday with post nasal drip, headache and pressure in her face. By end of the day on Monday she felt like she had covid. Her home test was negative that night. She feels pretty full in the AM and coughs at night. Feels good during the day. By 7:30-8:00pm she is ready for bed. She has had no fever. She has all clear drainage coming out of her nose. She does have some tenderness over her sinuses with palpation. No teeth pain. She has no body aches. She does have a sore throat and thinks it due to drainage. She has been using flonase and benadryl. She took a claritin this AM. She also took another covid test on Wednesday as well and it was negative. She doesn't feel like she is getting better, but she doesn't feel like she is getting  worse. She has chronic sinusitis and it feels the same to her. She has no shortness of breath, wheezing or cough except at night with the drainage.   Review of Systems  Constitutional: Negative for chills, fatigue and fever.  HENT: Positive for congestion, postnasal drip and sinus pressure. Negative for ear pain.   Respiratory: Positive for cough. Negative for chest tightness and shortness of breath.   Gastrointestinal: Negative for abdominal pain, diarrhea, nausea and vomiting.  Musculoskeletal: Negative for arthralgias and myalgias.  Neurological: Negative for dizziness.    Allergies Patient is allergic to indocin [indomethacin], morphine sulfate, sulfacetamide sodium, caffeine, morphine and related, sulfa antibiotics, azithromycin, and erythromycin stearate.  Past Medical History Patient  has a past medical history of Allergic urticaria, ARTHRALGIA, Arthritis, Atrial fibrillation ([place]), B12 DEFICIENCY, CFS (chronic fatigue syndrome), Endometriosis, Epidural hemorrhage with loss of consciousness ([place]) (06/29/15), Fibroid, Fibromyalgia, Fracture of cervical vertebra, C6 ([place]) (06/29/15), Gastritis, GERD, Heart murmur, History of endometriosis, IBS (irritable bowel syndrome), Internal hemorrhoids, MVP (mitral valve prolapse), Osteoarthritis, Rosacea, Skin cancer of arm, right, and Thoracic compression fracture ([place]) (06/29/15).  Surgical History Patient  has a past surgical history that includes Appendectomy; Abdominal hysterectomy; Ovarian cyst removal; Uterine fibroid surgery; Hemorrhoid surgery; Anal fissure repair; Tubal ligation; RT BUNIONECTOMT; Tonsillectomy and adenoidectomy; Abdominal hysterectomy; and Colposcopy.  Family History [name]'s family history includes Breast cancer in her paternal aunt; Cancer in her mother; Diabetes in an other family member; Healthy in her father; Lung cancer in her mother;  Thyroid cancer in her mother; Von Willebrand disease in her daughter.  Social  History Patient  reports that she quit smoking about 31 years ago. She has never used smokeless tobacco. She reports current alcohol use of about 1.0 - 2.0 standard drink of alcohol per week. She reports that she does not use drugs.    Objective: Vitals:   04/26/20 1102  Weight: 175 lb 0.7 oz (79.4 kg)  Height: 5\' 4"  (1.626 m)    Body mass index is 30.05 kg/m.  Physical Exam Vitals reviewed.        Assessment/plan: 1. Acute recurrent maxillary sinusitis -x2 negative covid tests. Feels like her recurrent sinusitis. 10 day course of augmentin, continue flonase and claritin and will add on Singulair to see if any added benefits. Keep me updated. Precautions given   15 spent in audio with patient discussing current medical issues/symptoms and treatment.   Return if symptoms worsen or fail to improve.    Orma Flaming, MD [place]  04/26/2020

## 2020-05-03 ENCOUNTER — Ambulatory Visit (INDEPENDENT_AMBULATORY_CARE_PROVIDER_SITE_OTHER): Payer: BC Managed Care – PPO

## 2020-05-03 ENCOUNTER — Ambulatory Visit: Payer: BC Managed Care – PPO | Admitting: Family Medicine

## 2020-05-03 ENCOUNTER — Other Ambulatory Visit: Payer: Self-pay

## 2020-05-03 ENCOUNTER — Encounter: Payer: Self-pay | Admitting: Family Medicine

## 2020-05-03 DIAGNOSIS — M5441 Lumbago with sciatica, right side: Secondary | ICD-10-CM

## 2020-05-03 DIAGNOSIS — M25561 Pain in right knee: Secondary | ICD-10-CM | POA: Diagnosis not present

## 2020-05-03 DIAGNOSIS — S83241A Other tear of medial meniscus, current injury, right knee, initial encounter: Secondary | ICD-10-CM

## 2020-05-03 DIAGNOSIS — G8929 Other chronic pain: Secondary | ICD-10-CM

## 2020-05-03 NOTE — Progress Notes (Signed)
[facility] [address] [place] Phone: [phone] Subjective:   I Kandace Blitz am serving as a Education administrator for Dr. Hulan Saas.  This visit occurred during the SARS-CoV-2 public health emergency.  Safety protocols were in place, including screening questions prior to the visit, additional usage of staff PPE, and extensive cleaning of exam room while observing appropriate contact time as indicated for disinfecting solutions.   I'm seeing this patient by the request  of:  Plotnikov, [name], MD  CC: Low back pain  IOX:[id number]   03/19/2020 Patient has had chronic pain previously.  We will get x-rays today.  Seems to be more sacroiliac.  Could be her compensating more for the knee as well as her foot she states.  Discussed icing regimen, home exercise, discussed topical anti-inflammatories.  Follow-up with me again in 4 to 8 weeks  Patient does have a fibromyalgia that could be contributing as well to some of the aches and pains that seems to be out of proportion  Update 05/03/2020 DAISA STENNIS is a 55 y.o. female coming in with complaint of low back pain. Patient states her back is doing well. States that usually in the mornings she has a lot of discomfort. Usually wakes up with pain. Right knee is doing better. Hasn't been able to do PT. patient states that overall doing relatively well.  Has had a lot of stress in life so has not been able to put herself first.  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 No disc space narrowing. No acute finding.     Past Medical History:  Diagnosis Date  . Allergic urticaria   . ARTHRALGIA   . Arthritis   . Atrial fibrillation ([place])   . B12 DEFICIENCY   . CFS (chronic fatigue syndrome)   . Endometriosis   . Epidural hemorrhage with  loss of consciousness ([place]) 06/29/15  . Fibroid   . Fibromyalgia   . Fracture of cervical vertebra, C6 ([place]) 06/29/15  . Gastritis   . GERD   . Heart murmur   . History of endometriosis   . IBS (irritable bowel syndrome)   . Internal hemorrhoids   . MVP (mitral valve prolapse)   . Osteoarthritis   . Rosacea   . Skin cancer of arm, right   . Thoracic compression fracture ([place]) 06/29/15   T2-T6   Past Surgical History:  Procedure Laterality Date  . ABDOMINAL HYSTERECTOMY     partial  . ABDOMINAL HYSTERECTOMY    . ANAL FISSURE REPAIR     Dr Rise [name] 2009  . APPENDECTOMY    . COLPOSCOPY    . HEMORRHOID SURGERY     Dr. Rise [name] 2009  . OVARIAN CYST REMOVAL    . RT BUNIONECTOMT    . TONSILLECTOMY AND ADENOIDECTOMY    . TUBAL LIGATION    . UTERINE FIBROID SURGERY     Social History   Socioeconomic History  . Marital status: Married    Spouse name: Not on file  . Number of children: Not on file  . Years of education: Not on file  . Highest education level: Not on file  Occupational History  . Not on file  Tobacco Use  . Smoking status: Former Smoker    Quit date: 10/31/1988    Years since quitting: 31.5  .  Smokeless tobacco: Never Used  Vaping Use  . Vaping Use: Never used  Substance and Sexual Activity  . Alcohol use: Yes    Alcohol/week: 1.0 - 2.0 standard drink    Types: 1 - 2 Standard drinks or equivalent per week    Comment: occasional   . Drug use: No  . Sexual activity: Yes    Partners: Male    Birth control/protection: Surgical    Comment: hysterectomy  Other Topics Concern  . Not on file  Social History Narrative   ** Merged History Encounter **       Working 2 jobs, 6d/wk      Regular exercise-no      Divorced   Social Determinants of Corporate investment banker Strain: Not on file  Food Insecurity: Not on file  Transportation Needs: Not on file  Physical Activity: Not on file  Stress: Not on file  Social Connections: Not on file    Allergies  Allergen Reactions  . Indocin [Indomethacin] Swelling, Rash and Other (See Comments)    REACTION: lupus like illness; butterfly rash, pain and swelling all over body.   . Morphine Sulfate Nausea And Vomiting  . Sulfacetamide Sodium Swelling  . Caffeine Other (See Comments)    Induces A-fib  . Morphine And Related     nausea  . Sulfa Antibiotics Swelling  . Azithromycin Rash    ALL MYCIN DRUGS  . Erythromycin Stearate Hives and Swelling    REACTION: all mycins   Family History  Problem Relation Age of Onset  . Lung cancer Mother   . Thyroid cancer Mother   . Cancer Mother        INTESTINAL  . Healthy Father   . Diabetes Other        uncles/aunts  . Von Willebrand disease Daughter   . Breast cancer Paternal Aunt   . Asthma Neg Hx       Current Outpatient Medications (Respiratory):  .  albuterol (PROAIR HFA) 108 (90 Base) MCG/ACT inhaler, Inhale 2 puffs into the lungs every 6 (six) hours as needed for wheezing or shortness of breath. .  loratadine (CLARITIN) 10 MG tablet, Take 1 tablet (10 mg total) by mouth daily. .  montelukast (SINGULAIR) 10 MG tablet, Take 1 tablet (10 mg total) by mouth at bedtime.   Current Outpatient Medications (Hematological):  .  cyanocobalamin (,VITAMIN B-12,) 1000 MCG/ML injection, Inject into the muscle.  Current Outpatient Medications (Other):  .  amoxicillin-clavulanate (AUGMENTIN) 875-125 MG tablet, Take 1 tablet by mouth 2 (two) times daily. .  Ascorbic Acid (VITAMIN C PO), Take by mouth. .  Cholecalciferol (VITAMIN D3) 125 MCG (5000 UT) TABS, Take 10,000 Units by mouth daily. .  IVERMECTIN PO,  .  Multiple Vitamins-Minerals (ZINC PO), Take by mouth. .  Omega-3 Fatty Acids (FISH OIL PO), Take by mouth. .  Probiotic Product (PROBIOTIC PO), Take by mouth. .  SYRINGE-NEEDLE, DISP, 3 ML (BD ECLIPSE SYRINGE) 25G X 1" 3 ML MISC, For SQ Vit B12 shots .  Turmeric 500 MG TABS, Take by mouth 2 (two) times daily. Marland Kitchen  UNABLE TO FIND,  Immunity booster .  VITAMIN K PO, Take 1 tablet by mouth every other day. Marland Kitchen  ZINC GLUCONATE PO, Take 1 tablet by mouth daily.   Reviewed prior external information including notes and imaging from  primary care provider As well as notes that were available from care everywhere and other healthcare systems.  Past medical history, social, surgical  and family history all reviewed in electronic medical record.  No pertanent information unless stated regarding to the chief complaint.   Review of Systems:  No headache, visual changes, nausea, vomiting, diarrhea, constipation, dizziness, abdominal pain, skin rash, fevers, chills, night sweats, weight loss, swollen lymph nodes, joint swelling, chest pain, shortness of breath, mood changes. POSITIVE muscle aches, body aches  Objective  Blood pressure 100/70, pulse 72, height 5\' 4"  (1.626 m), weight 176 lb (79.8 kg), SpO2 99 %.   General: No apparent distress alert and oriented x3 mood and affect normal, dressed appropriately.  HEENT: Pupils equal, extraocular movements intact  Respiratory: Patient's speak in full sentences and does not appear short of breath  Cardiovascular: No lower extremity edema, non tender, no erythema  Gait normal with good balance and coordination.  MSK:   Patient back does have more tenderness to palpation of the paraspinal musculature right greater than left.  More pain around the right sacroiliac joint.  Negative straight leg test.  Out of 5 strength in lower extremities.   Right knee still has some tenderness over the medial joint space.  Mild positive McMurray's.  Good range of motion though.  Patient does seem better than previous exam     Impression and Recommendations:     The above documentation has been reviewed and is accurate and complete Lyndal Pulley, DO

## 2020-05-03 NOTE — Assessment & Plan Note (Signed)
Chronic problem overall.  Patient did have x-rays that did show some facet arthritis.  Patient is to start with formal physical therapy.  Patient does have pain in the sacroiliac joint as well.  We did discuss the proper sleeping position, discussed potentially a new mattress contributed to.  Patient has responded very well to physical therapy in the past and will follow up with me again in 3 months

## 2020-05-03 NOTE — Patient Instructions (Addendum)
Good to see you Get the xrays today Start with PT again Try to find time for yourself Take baby steps See me again in 3 months

## 2020-05-03 NOTE — Assessment & Plan Note (Signed)
Patient has what seems to be more meniscal tear but is doing much better at this time.  Patient has had a Doppler that did rule out any DVT.  Patient is making good progress.  Encouraged her to restart the with formal physical therapy.  We will get x-rays just to have them in the computer system especially with how long this has been..  Follow-up with me again 6 to 8 weeks

## 2020-05-13 ENCOUNTER — Ambulatory Visit: Payer: BC Managed Care – PPO | Admitting: Obstetrics and Gynecology

## 2020-05-19 ENCOUNTER — Other Ambulatory Visit: Payer: Self-pay | Admitting: Family Medicine

## 2020-06-06 ENCOUNTER — Emergency Department (HOSPITAL_BASED_OUTPATIENT_CLINIC_OR_DEPARTMENT_OTHER)
Admission: EM | Admit: 2020-06-06 | Discharge: 2020-06-06 | Disposition: A | Discharge status: Home / Self Care | Payer: BC Managed Care – PPO | Attending: Emergency Medicine | Admitting: Emergency Medicine

## 2020-06-06 ENCOUNTER — Other Ambulatory Visit: Payer: Self-pay

## 2020-06-06 ENCOUNTER — Encounter (HOSPITAL_BASED_OUTPATIENT_CLINIC_OR_DEPARTMENT_OTHER): Payer: Self-pay | Admitting: Emergency Medicine

## 2020-06-06 ENCOUNTER — Emergency Department (HOSPITAL_BASED_OUTPATIENT_CLINIC_OR_DEPARTMENT_OTHER): Payer: BC Managed Care – PPO

## 2020-06-06 DIAGNOSIS — S50811A Abrasion of right forearm, initial encounter: Secondary | ICD-10-CM | POA: Diagnosis not present

## 2020-06-06 DIAGNOSIS — S93602A Unspecified sprain of left foot, initial encounter: Secondary | ICD-10-CM | POA: Insufficient documentation

## 2020-06-06 DIAGNOSIS — M25521 Pain in right elbow: Secondary | ICD-10-CM | POA: Diagnosis not present

## 2020-06-06 DIAGNOSIS — S99922A Unspecified injury of left foot, initial encounter: Secondary | ICD-10-CM | POA: Diagnosis present

## 2020-06-06 DIAGNOSIS — S8002XA Contusion of left knee, initial encounter: Secondary | ICD-10-CM | POA: Insufficient documentation

## 2020-06-06 DIAGNOSIS — W182XXA Fall in (into) shower or empty bathtub, initial encounter: Secondary | ICD-10-CM | POA: Diagnosis not present

## 2020-06-06 DIAGNOSIS — Z87891 Personal history of nicotine dependence: Secondary | ICD-10-CM | POA: Insufficient documentation

## 2020-06-06 DIAGNOSIS — W19XXXA Unspecified fall, initial encounter: Secondary | ICD-10-CM

## 2020-06-06 NOTE — ED Provider Notes (Signed)
[facility] EMERGENCY DEPARTMENT Provider Note   CSN: [id number] Arrival date & time: 06/06/20  0240     History Chief Complaint  Patient presents with  . Fall    Regina [name] is a 55 y.o. female.  HPI Patient presents after slip and fall in her shower.  States that her right knee gave out.  Complaining of some mild pain on her right elbow.  Also pain in her left knee and left foot with some abrasion on left foot.  State has been ambulatory but really cannot walk on left foot.  States it hurts when she touches the skin.  She is not on anticoagulation.  Not hit her head.  No loss consciousness.  No back pain.    Past Medical History:  Diagnosis Date  . Allergic urticaria   . ARTHRALGIA   . Arthritis   . Atrial fibrillation ([place])   . B12 DEFICIENCY   . CFS (chronic fatigue syndrome)   . Endometriosis   . Epidural hemorrhage with loss of consciousness ([place]) 06/29/15  . Fibroid   . Fibromyalgia   . Fracture of cervical vertebra, C6 ([place]) 06/29/15  . Gastritis   . GERD   . Heart murmur   . History of endometriosis   . IBS (irritable bowel syndrome)   . Internal hemorrhoids   . MVP (mitral valve prolapse)   . Osteoarthritis   . Rosacea   . Skin cancer of arm, right   . Thoracic compression fracture ([place]) 06/29/15   T2-T6    Patient Active Problem List   Diagnosis Date Noted  . Allergic rhinitis 02/19/2020  . Patellofemoral syndrome of right knee 12/21/2019  . Acute medial meniscal tear, right, initial encounter 12/21/2019  . Stress at home 10/24/2019  . Wrist tendonitis 10/24/2019  . Vitamin D deficiency 05/03/2019  . Multiple thyroid nodules 03/08/2019  . Oropharyngeal dysphagia 03/08/2019  . Constipation 12/26/2018  . Brachial neuritis of right upper extremity 07/06/2018  . Panic attacks 10/20/2016  . Loss of transverse plantar arch of right foot 09/07/2016  . Aphasia 03/13/2016  . Fibromyalgia 12/11/2015  . Post concussion syndrome 11/01/2015  .  Cervical radicular pain 09/20/2015  . Pain in joint, shoulder region 09/11/2015  . Skull fracture with concussion ([place]) 07/30/2015  . Fracture of cervical vertebra, C6 ([place]) 07/30/2015  . Thoracic vertebral fracture ([place]) 07/30/2015  . Scaly patch rash 02/26/2015  . Mitral valve regurgitation 09/27/2014  . PAT (paroxysmal atrial tachycardia) ([place]) 09/06/2014  . Asthmatic bronchitis 05/08/2013  . Benign paroxysmal positional vertigo 05/08/2013  . Depression (emotion) 01/10/2013  . IBS (irritable bowel syndrome) 02/08/2012  . Multiple pulmonary nodules 02/08/2012  . GERD 05/24/2010  . SINUSITIS, MAXILLARY, CHRONIC 05/20/2010  . Fatigue 11/20/2009  . LOW BACK PAIN 02/20/2009  . Rosacea 12/27/2008  . Allergic urticaria 12/27/2008  . B12 deficiency 01/02/2008    Past Surgical History:  Procedure Laterality Date  . ABDOMINAL HYSTERECTOMY     partial  . ABDOMINAL HYSTERECTOMY    . ANAL FISSURE REPAIR     Dr Rise [name] 2009  . APPENDECTOMY    . COLPOSCOPY    . HEMORRHOID SURGERY     Dr. Rise [name] 2009  . OVARIAN CYST REMOVAL    . RT BUNIONECTOMT    . TONSILLECTOMY AND ADENOIDECTOMY    . TUBAL LIGATION    . UTERINE FIBROID SURGERY       OB History    Gravida  3   Para  2  Term  2   Preterm  0   AB  1   Living  2     SAB  1   IAB  0   Ectopic  0   Multiple      Live Births  2           Family History  Problem Relation Age of Onset  . Lung cancer Mother   . Thyroid cancer Mother   . Cancer Mother        INTESTINAL  . Healthy Father   . Diabetes Other        uncles/aunts  . Von Willebrand disease Daughter   . Breast cancer Paternal Aunt   . Asthma Neg Hx     Social History   Tobacco Use  . Smoking status: Former Smoker    Quit date: 10/31/1988    Years since quitting: 31.6  . Smokeless tobacco: Never Used  Vaping Use  . Vaping Use: Never used  Substance Use Topics  . Alcohol use: Yes    Alcohol/week: 1.0 - 2.0 standard drink    Types: 1  - 2 Standard drinks or equivalent per week    Comment: occasional   . Drug use: No    Home Medications Prior to Admission medications   Medication Sig Start Date End Date Taking? Authorizing Provider  albuterol (PROAIR HFA) 108 (90 Base) MCG/ACT inhaler Inhale 2 puffs into the lungs every 6 (six) hours as needed for wheezing or shortness of breath. 02/08/20   Lucretia Kern, DO  amoxicillin-clavulanate (AUGMENTIN) 875-125 MG tablet Take 1 tablet by mouth 2 (two) times daily. 04/26/20   Orma Flaming, MD  Ascorbic Acid (VITAMIN C PO) Take by mouth.    [provider]  Cholecalciferol (VITAMIN D3) 125 MCG (5000 UT) TABS Take 10,000 Units by mouth daily.    [provider]  cyanocobalamin (,VITAMIN B-12,) 1000 MCG/ML injection Inject into the muscle. 01/26/20   [provider]  IVERMECTIN PO  04/10/20   [provider]  loratadine (CLARITIN) 10 MG tablet Take 1 tablet (10 mg total) by mouth daily. 02/13/20   Plotnikov, [name], MD  montelukast (SINGULAIR) 10 MG tablet TAKE 1 TABLET BY MOUTH EVERYDAY AT BEDTIME 05/20/20   Orma Flaming, MD  Multiple Vitamins-Minerals (ZINC PO) Take by mouth.    [provider]  Omega-3 Fatty Acids (FISH OIL PO) Take by mouth.    [provider]  Probiotic Product (PROBIOTIC PO) Take by mouth.    [provider]  SYRINGE-NEEDLE, DISP, 3 ML (BD ECLIPSE SYRINGE) 25G X 1" 3 ML MISC For SQ Vit B12 shots 07/12/19   Plotnikov, [name], MD  Turmeric 500 MG TABS Take by mouth 2 (two) times daily.    [provider]  UNABLE TO FIND Immunity booster    [provider]  VITAMIN K PO Take 1 tablet by mouth every other day.    [provider]  ZINC GLUCONATE PO Take 1 tablet by mouth daily.    [provider]    Allergies    Indocin [indomethacin], Morphine sulfate, Sulfacetamide sodium, Caffeine, Morphine and related, Sulfa antibiotics, and Erythromycin stearate  Review of  Systems   Review of Systems  Constitutional: Negative for fever.  Respiratory: Negative for shortness of breath.   Cardiovascular: Negative for chest pain.  Gastrointestinal: Negative for abdominal pain.  Endocrine: Negative for polyuria.  Genitourinary: Negative for flank pain.  Musculoskeletal:  Right elbow left knee and left foot/ankle pain.  Skin: Positive for wound.  Neurological: Negative for weakness, numbness and headaches.  Psychiatric/Behavioral: Negative for confusion.    Physical Exam Updated Vital Signs BP 106/65 (BP Location: Right Arm)   Pulse 60   Temp 98.1 F (36.7 C) (Oral)   Resp 18   Ht 5\' 5"  (1.651 m)   Wt 80.9 kg   SpO2 100%   BMI 29.68 kg/m   Physical Exam Vitals and nursing note reviewed.  Constitutional:      Appearance: Normal appearance.  HENT:     Head: Atraumatic.     Mouth/Throat:     Mouth: Mucous membranes are moist.  Eyes:     Pupils: Pupils are equal, round, and reactive to light.  Cardiovascular:     Rate and Rhythm: Regular rhythm.  Pulmonary:     Breath sounds: No wheezing, rhonchi or rales.  Abdominal:     Tenderness: There is no abdominal tenderness.  Musculoskeletal:        General: Tenderness present.     Cervical back: Neck supple.     Comments: Abrasion to dorsal aspect of right distal forearm/elbow.  No underlying bony tenderness with good range of motion in wrist and elbow.  No tenderness over radial head.  No shoulder tenderness.  Abrasion of anterior aspect of left ankle and left foot.  Good range of motion in ankle but moderate tenderness over left foot.  Tenderness over left anterior knee.  Able to hold knee straight however.  Skin intact.  No effusion.  Skin:    Capillary Refill: Capillary refill takes less than 2 seconds.  Neurological:     Mental Status: She is alert and oriented to person, place, and time.  Psychiatric:        Mood and Affect: Mood normal.     ED Results / Procedures / Treatments    Labs (all labs ordered are listed, but only abnormal results are displayed) Labs Reviewed - No data to display  EKG None  Radiology DG Knee Complete 4 Views Left  Result Date: 06/06/2020 CLINICAL DATA:  55 year old female status post fall this morning. Abrasions, pain. EXAM: LEFT KNEE - COMPLETE 4+ VIEW COMPARISON:  None. FINDINGS: Bone mineralization is within normal limits. No evidence of fracture, dislocation, or joint effusion. Minimal lateral compartment degenerative spurring. No acute osseous abnormality identified. No discrete soft tissue injury. IMPRESSION: Negative for age. Electronically Signed   By: Genevie Ann M.D.   On: 06/06/2020 10:03   DG Foot Complete Left  Result Date: 06/06/2020 CLINICAL DATA:  55 year old female status post fall this morning. Abrasions, pain. EXAM: LEFT FOOT - COMPLETE 3+ VIEW COMPARISON:  None. FINDINGS: Bone mineralization is within normal limits. Healed chronic fracture of the 3rd proximal phalanx. Other phalanges appear intact. Tarsals and metatarsals appear intact and normally aligned. Normal joint spaces. No discrete soft tissue injury. IMPRESSION: No acute fracture or dislocation identified about the left foot. Electronically Signed   By: Genevie Ann M.D.   On: 06/06/2020 10:04    Procedures Procedures   Medications Ordered in ED Medications - No data to display  ED Course  I have reviewed the triage vital signs and the nursing notes.  Pertinent labs & imaging results that were available during my care of the patient were reviewed by me and considered in my medical decision making (see chart for details).    MDM Rules/Calculators/A&P  Patient presents with mechanical fall.  Left foot left knee pain.  Also right forearm although right forearm does not need imaging.  Left knee imaging reassuring.  Also reassuring left foot injury.  However does have moderate moderate tenderness on the foot.  Will have patient use cam walker  as needed.  Patient states she has one at home so will not provide a new one.  Has seen Charlann Boxer from sports medicine will follow with him.  If symptoms improve will not need much work-up but if continued pain will need further evaluation.  Discharge home. Final Clinical Impression(s) / ED Diagnoses Final diagnoses:  Fall, initial encounter  Foot sprain, left, initial encounter  Contusion of left knee, initial encounter    Rx / DC Orders ED Discharge Orders    None       Davonna Belling, MD 06/06/20 1220

## 2020-06-06 NOTE — ED Notes (Signed)
Ice pack applied to left ankle.

## 2020-06-06 NOTE — ED Triage Notes (Signed)
Pt slipped in shower this am and fell.  Injured left knee and left ankle/foot.  Pt has abrasion to right elbow/forearm.  No head injury or LOC.

## 2020-06-12 ENCOUNTER — Other Ambulatory Visit: Payer: Self-pay | Admitting: Family Medicine

## 2020-07-09 ENCOUNTER — Other Ambulatory Visit: Payer: Self-pay

## 2020-07-09 ENCOUNTER — Encounter: Payer: Self-pay | Admitting: Family Medicine

## 2020-07-09 ENCOUNTER — Ambulatory Visit (INDEPENDENT_AMBULATORY_CARE_PROVIDER_SITE_OTHER): Payer: BC Managed Care – PPO | Admitting: Family Medicine

## 2020-07-09 ENCOUNTER — Ambulatory Visit (INDEPENDENT_AMBULATORY_CARE_PROVIDER_SITE_OTHER): Payer: BC Managed Care – PPO

## 2020-07-09 VITALS — BP 110/74 | HR 76 | Wt 177.0 lb

## 2020-07-09 DIAGNOSIS — M797 Fibromyalgia: Secondary | ICD-10-CM | POA: Diagnosis not present

## 2020-07-09 DIAGNOSIS — M545 Low back pain, unspecified: Secondary | ICD-10-CM | POA: Diagnosis not present

## 2020-07-09 DIAGNOSIS — G8929 Other chronic pain: Secondary | ICD-10-CM

## 2020-07-09 DIAGNOSIS — M25521 Pain in right elbow: Secondary | ICD-10-CM

## 2020-07-09 DIAGNOSIS — M5441 Lumbago with sciatica, right side: Secondary | ICD-10-CM

## 2020-07-09 NOTE — Assessment & Plan Note (Signed)
Chronic problem with exacerbation after fall.  We will continue to monitor.

## 2020-07-09 NOTE — Assessment & Plan Note (Signed)
After fall.  Could be contributing to some of the discomfort and pain.  May be potentially a very mild olecranon neuritis could be contributing to some of it.  Discussed potential compression sleeve, follow-up again 6 weeks

## 2020-07-09 NOTE — Progress Notes (Signed)
[facility] [place] Phone: [phone] Subjective:   Regina [name], am serving as a scribe for Dr. Hulan Saas. This visit occurred during the SARS-CoV-2 public health emergency.  Safety protocols were in place, including screening questions prior to the visit, additional usage of staff PPE, and extensive cleaning of exam room while observing appropriate contact time as indicated for disinfecting solutions.   I'm seeing this patient by the request  of:  Plotnikov, [name], MD  CC: Back pain and knee pain after fall  GMW:[id number]   05/03/2020 Chronic problem overall.  Patient did have x-rays that did show some facet arthritis.  Patient is to start with formal physical therapy.  Patient does have pain in the sacroiliac joint as well.  We did discuss the proper sleeping position, discussed potentially a new mattress contributed to.  Patient has responded very well to physical therapy in the past and will follow up with me again in 3 months  Patient has what seems to be more meniscal tear but is doing much better at this time.  Patient has had a Doppler that did rule out any DVT.  Patient is making good progress.  Encouraged her to restart the with formal physical therapy.  We will get x-rays just to have them in the computer system especially with how long this has been..  Follow-up with me again 6 to 8 weeks  Update 07/09/2020 Regina [name] is a 55 y.o. female coming in with complaint of back and right knee pain. Did fall on 06/06/2020 injuring R elbow and L foot. Xrays negative. Pain in back is worse especially at night since the accident.   Feels that knee pain is better. Unable to go back to PT due to schedule. Pain is not as bad as it was and is able to perform some PT at home.   Patient states that her L foot is painful within mortise. Ankle swells with weightbearing.  Patient states it does not stop her from activity just  seems to be more uncomfortable at the end of the day.  R elbow pain over olecranon especially when she rests elbow on center armrest of vehicle.  Patient denies any significant swelling.     Past Medical History:  Diagnosis Date  . Allergic urticaria   . ARTHRALGIA   . Arthritis   . Atrial fibrillation ([place])   . B12 DEFICIENCY   . CFS (chronic fatigue syndrome)   . Endometriosis   . Epidural hemorrhage with loss of consciousness ([place]) 06/29/15  . Fibroid   . Fibromyalgia   . Fracture of cervical vertebra, C6 ([place]) 06/29/15  . Gastritis   . GERD   . Heart murmur   . History of endometriosis   . IBS (irritable bowel syndrome)   . Internal hemorrhoids   . MVP (mitral valve prolapse)   . Osteoarthritis   . Rosacea   . Skin cancer of arm, right   . Thoracic compression fracture ([place]) 06/29/15   T2-T6   Past Surgical History:  Procedure Laterality Date  . ABDOMINAL HYSTERECTOMY     partial  . ABDOMINAL HYSTERECTOMY    . ANAL FISSURE REPAIR     Dr Rise [name] 2009  . APPENDECTOMY    . COLPOSCOPY    . HEMORRHOID SURGERY     Dr. Rise [name] 2009  . OVARIAN CYST REMOVAL    . RT BUNIONECTOMT    . TONSILLECTOMY AND ADENOIDECTOMY    .  TUBAL LIGATION    . UTERINE FIBROID SURGERY     Social History   Socioeconomic History  . Marital status: Married    Spouse name: Not on file  . Number of children: Not on file  . Years of education: Not on file  . Highest education level: Not on file  Occupational History  . Not on file  Tobacco Use  . Smoking status: Former Smoker    Quit date: 10/31/1988    Years since quitting: 31.7  . Smokeless tobacco: Never Used  Vaping Use  . Vaping Use: Never used  Substance and Sexual Activity  . Alcohol use: Yes    Alcohol/week: 1.0 - 2.0 standard drink    Types: 1 - 2 Standard drinks or equivalent per week    Comment: occasional   . Drug use: [name]  . Sexual activity: Yes    Partners: Male    Birth control/protection: Surgical    Comment:  hysterectomy  Other Topics Concern  . Not on file  Social History Narrative   ** Merged History Encounter **       Working 2 jobs, 6d/wk      Regular exercise-[name]      Divorced   Social Determinants of Radio broadcast assistant Strain: Not on file  Food Insecurity: Not on file  Transportation Needs: Not on file  Physical Activity: Not on file  Stress: Not on file  Social Connections: Not on file   Allergies  Allergen Reactions  . Indocin [Indomethacin] Swelling, Rash and Other (See Comments)    REACTION: lupus like illness; butterfly rash, pain and swelling all over body.   . Morphine Sulfate Nausea And Vomiting  . Sulfacetamide Sodium Swelling  . Caffeine Other (See Comments)    Induces A-fib  . Morphine And Related     nausea  . Sulfa Antibiotics Swelling  . Erythromycin Stearate Hives and Swelling    REACTION: all mycins   Family History  Problem Relation Age of Onset  . Lung cancer Mother   . Thyroid cancer Mother   . Cancer Mother        INTESTINAL  . Healthy Father   . Diabetes Other        uncles/aunts  . Von Willebrand disease Daughter   . Breast cancer Paternal Aunt   . Asthma Neg Hx       Current Outpatient Medications (Respiratory):  .  albuterol (PROAIR HFA) 108 (90 Base) MCG/ACT inhaler, Inhale 2 puffs into the lungs every 6 (six) hours as needed for wheezing or shortness of breath. .  loratadine (CLARITIN) 10 MG tablet, Take 1 tablet (10 mg total) by mouth daily. .  montelukast (SINGULAIR) 10 MG tablet, TAKE 1 TABLET BY MOUTH EVERYDAY AT BEDTIME   Current Outpatient Medications (Hematological):  .  cyanocobalamin (,VITAMIN B-12,) 1000 MCG/ML injection, Inject into the muscle.  Current Outpatient Medications (Other):  .  amoxicillin-clavulanate (AUGMENTIN) 875-125 MG tablet, Take 1 tablet by mouth 2 (two) times daily. .  Ascorbic Acid (VITAMIN C PO), Take by mouth. .  Cholecalciferol (VITAMIN D3) 125 MCG (5000 UT) TABS, Take 10,000 Units by  mouth daily. .  IVERMECTIN PO,  .  Multiple Vitamins-Minerals (ZINC PO), Take by mouth. .  Omega-3 Fatty Acids (FISH OIL PO), Take by mouth. .  Probiotic Product (PROBIOTIC PO), Take by mouth. .  SYRINGE-NEEDLE, DISP, 3 ML (BD ECLIPSE SYRINGE) 25G X 1" 3 ML MISC, For SQ Vit B12 shots .  Turmeric  500 MG TABS, Take by mouth 2 (two) times daily. Marland Kitchen  UNABLE TO FIND, Immunity booster .  VITAMIN K PO, Take 1 tablet by mouth every other day. Marland Kitchen  ZINC GLUCONATE PO, Take 1 tablet by mouth daily.   Reviewed prior external information including notes and imaging from  primary care provider As well as notes that were available from care everywhere and other healthcare systems.  Past medical history, social, surgical and family history all reviewed in electronic medical record.  [name] pertanent information unless stated regarding to the chief complaint.   Review of Systems:  [name] headache, visual changes, nausea, vomiting, diarrhea, constipation, dizziness, abdominal pain, skin rash, fevers, chills, night sweats, weight loss, swollen lymph nodes, body aches, joint swelling, chest pain, shortness of breath, mood changes. POSITIVE muscle aches  Objective  Blood pressure 110/74, pulse 76, weight 177 lb (80.3 kg), SpO2 98 %.   General: [name] apparent distress alert and oriented x3 mood and affect normal, dressed appropriately.  HEENT: Pupils equal, extraocular movements intact  Respiratory: Patient's speak in full sentences and does not appear short of breath  Cardiovascular: [name] lower extremity edema, non tender, [name] erythema  Gait normal with good balance and coordination.  MSK:  Low back exam does have some mild loss of lordosis.  Tightness noted in all ranges.  Negative straight leg test, neurovascularly intact distally.  Deep tendon reflexes are intact. Patient's left ankle is tender to palpation over the ATFL.  [name] significant instability noted.  [name] swelling noted today.  [name] pain over the ankle  mortise.  Right elbow exam shows the patient does have tenderness over the olecranon minorly.  Full range of motion [name] noted.  Negative Tinel's noted but does have discomfort in this area.  Limited musculoskeletal ultrasound was performed and interpreted by .Lyndal Pulley  Limited ultrasound of patient's right elbow shows the patient does have very minimal displacement of the ulnar nerve noted that makes it more superficial.  Olecranon may have a bone contusion noted but [name] true cortical irregularity.  Mild enlargement of the olecranon bursa noted.   Impression and Recommendations:     The above documentation has been reviewed and is accurate and complete Lyndal Pulley, DO

## 2020-07-09 NOTE — Assessment & Plan Note (Signed)
We will get x-rays to further evaluate.  No radicular symptoms.  Likely exacerbation of more patient's fibromyalgia.  No significant medication changes.  Home exercises given today.  We will continue to monitor.  Follow-up with me again in 4 to 6 weeks

## 2020-07-09 NOTE — Patient Instructions (Addendum)
Xray today Ice 20 minutes 2 times daily. Usually after activity and before bed. Exercises 3 times a week.  (SI joint) Lets continue good shoes Your air cast daily for 2 weeks or at least with a lot of activity and mild exercises for the ankle See me again in 6 weeks

## 2020-07-23 ENCOUNTER — Encounter: Payer: Self-pay | Admitting: Internal Medicine

## 2020-07-23 ENCOUNTER — Ambulatory Visit: Payer: BC Managed Care – PPO | Admitting: Internal Medicine

## 2020-07-23 ENCOUNTER — Other Ambulatory Visit: Payer: Self-pay

## 2020-07-23 DIAGNOSIS — R918 Other nonspecific abnormal finding of lung field: Secondary | ICD-10-CM | POA: Diagnosis not present

## 2020-07-23 DIAGNOSIS — R413 Other amnesia: Secondary | ICD-10-CM

## 2020-07-23 DIAGNOSIS — J4520 Mild intermittent asthma, uncomplicated: Secondary | ICD-10-CM

## 2020-07-23 DIAGNOSIS — I471 Supraventricular tachycardia: Secondary | ICD-10-CM

## 2020-07-23 DIAGNOSIS — R0683 Snoring: Secondary | ICD-10-CM | POA: Diagnosis not present

## 2020-07-23 DIAGNOSIS — E538 Deficiency of other specified B group vitamins: Secondary | ICD-10-CM

## 2020-07-23 DIAGNOSIS — E559 Vitamin D deficiency, unspecified: Secondary | ICD-10-CM

## 2020-07-23 DIAGNOSIS — J3089 Other allergic rhinitis: Secondary | ICD-10-CM

## 2020-07-23 MED ORDER — ALBUTEROL SULFATE HFA 108 (90 BASE) MCG/ACT IN AERS: 2.0000 | INHALATION_SPRAY | Freq: Four times a day (QID) | RESPIRATORY_TRACT | 3 refills | Status: DC | PRN

## 2020-07-23 NOTE — Assessment & Plan Note (Signed)
Discussed R/o OSA Pulm ref

## 2020-07-23 NOTE — Assessment & Plan Note (Signed)
Last CT was ok 2019

## 2020-07-23 NOTE — Progress Notes (Signed)
Subjective:  Patient ID: Regina [name], female    DOB: 1966-03-29  Age: 55 y.o. MRN: [id number]  CC: Follow-up (4 MONTH F/U)   HPI Regina [name] presents for FMS, IBS, PAT - better C/o snoring, ?OSA C/o wheezing  Outpatient Medications Prior to Visit  Medication Sig Dispense Refill  . Ascorbic Acid (VITAMIN C PO) Take by mouth.    . Cholecalciferol (VITAMIN D3) 125 MCG (5000 UT) TABS Take 10,000 Units by mouth daily.    . cyanocobalamin (,VITAMIN B-12,) 1000 MCG/ML injection Inject into the muscle.    . IVERMECTIN PO     . loratadine (CLARITIN) 10 MG tablet Take 1 tablet (10 mg total) by mouth daily. 90 tablet 5  . Multiple Vitamins-Minerals (ZINC PO) Take by mouth.    . Omega-3 Fatty Acids (FISH OIL PO) Take by mouth.    . Probiotic Product (PROBIOTIC PO) Take by mouth.    . SYRINGE-NEEDLE, DISP, 3 ML (BD ECLIPSE SYRINGE) 25G X 1" 3 ML MISC For SQ Vit B12 shots 50 each 3  . Turmeric 500 MG TABS Take by mouth 2 (two) times daily.    Marland Kitchen UNABLE TO FIND Immunity booster    . VITAMIN K PO Take 1 tablet by mouth every other day.    Marland Kitchen ZINC GLUCONATE PO Take 1 tablet by mouth daily.    Marland Kitchen albuterol (PROAIR HFA) 108 (90 Base) MCG/ACT inhaler Inhale 2 puffs into the lungs every 6 (six) hours as needed for wheezing or shortness of breath. (Patient not taking: Reported on 07/23/2020) 1 each 0  . amoxicillin-clavulanate (AUGMENTIN) 875-125 MG tablet Take 1 tablet by mouth 2 (two) times daily. (Patient not taking: Reported on 07/23/2020) 20 tablet 0  . montelukast (SINGULAIR) 10 MG tablet TAKE 1 TABLET BY MOUTH EVERYDAY AT BEDTIME (Patient not taking: Reported on 07/23/2020) 30 tablet 1   No facility-administered medications prior to visit.    ROS: Review of Systems  Constitutional: Negative for activity change, appetite change, chills, fatigue and unexpected weight change.  HENT: Negative for congestion, mouth sores and sinus pressure.   Eyes: Negative for visual disturbance.  Respiratory:  Positive for wheezing. Negative for cough and chest tightness.   Gastrointestinal: Negative for abdominal pain and nausea.  Genitourinary: Negative for difficulty urinating, frequency and vaginal pain.  Musculoskeletal: Negative for back pain and gait problem.  Skin: Negative for pallor and rash.  Neurological: Negative for dizziness, tremors, weakness, numbness and headaches.  Psychiatric/Behavioral: Negative for confusion and sleep disturbance.    Objective:  BP 100/70 (BP Location: Left Arm)   Pulse 69   Temp 98 F (36.7 C) (Oral)   Ht 5\' 4"  (1.626 m)   Wt 174 lb (78.9 kg)   SpO2 97%   BMI 29.87 kg/m   BP Readings from Last 3 Encounters:  07/23/20 100/70  07/09/20 110/74  06/06/20 106/65    Wt Readings from Last 3 Encounters:  07/23/20 174 lb (78.9 kg)  07/09/20 177 lb (80.3 kg)  06/06/20 178 lb 6 oz (80.9 kg)    Physical Exam Constitutional:      General: She is not in acute distress.    Appearance: She is well-developed. She is obese.  HENT:     Head: Normocephalic.     Right Ear: External ear normal.     Left Ear: External ear normal.     Nose: Nose normal.  Eyes:     General:        Right  eye: No discharge.        Left eye: No discharge.     Conjunctiva/sclera: Conjunctivae normal.     Pupils: Pupils are equal, round, and reactive to light.  Neck:     Thyroid: No thyromegaly.     Vascular: No JVD.     Trachea: No tracheal deviation.  Cardiovascular:     Rate and Rhythm: Normal rate and regular rhythm.     Heart sounds: Normal heart sounds.  Pulmonary:     Effort: No respiratory distress.     Breath sounds: No stridor. No wheezing.  Abdominal:     General: Bowel sounds are normal. There is no distension.     Palpations: Abdomen is soft. There is no mass.     Tenderness: There is no abdominal tenderness. There is no guarding or rebound.  Musculoskeletal:        General: No tenderness.     Cervical back: Normal range of motion and neck supple.   Lymphadenopathy:     Cervical: No cervical adenopathy.  Skin:    Findings: No erythema or rash.  Neurological:     Cranial Nerves: No cranial nerve deficit.     Motor: No abnormal muscle tone.     Coordination: Coordination normal.     Deep Tendon Reflexes: Reflexes normal.  Psychiatric:        Behavior: Behavior normal.        Thought Content: Thought content normal.        Judgment: Judgment normal.     Lab Results  Component Value Date   WBC 7.5 01/31/2020   HGB 12.5 01/31/2020   HCT 37.8 01/31/2020   PLT 345.0 01/31/2020   GLUCOSE 73 01/31/2020   CHOL 213 (H) 08/23/2018   TRIG 118 08/23/2018   HDL 59 08/23/2018   LDLCALC 130 (H) 08/23/2018   ALT 12 01/31/2020   AST 14 01/31/2020   NA 141 01/31/2020   K 3.8 01/31/2020   CL 105 01/31/2020   CREATININE 0.91 01/31/2020   BUN 17 01/31/2020   CO2 30 01/31/2020   TSH 1.20 12/27/2018   INR 1.16 06/29/2015   HGBA1C 5.4 08/23/2018    DG Knee Complete 4 Views Left  Result Date: 06/06/2020 CLINICAL DATA:  55 year old female status post fall this morning. Abrasions, pain. EXAM: LEFT KNEE - COMPLETE 4+ VIEW COMPARISON:  None. FINDINGS: Bone mineralization is within normal limits. No evidence of fracture, dislocation, or joint effusion. Minimal lateral compartment degenerative spurring. No acute osseous abnormality identified. No discrete soft tissue injury. IMPRESSION: Negative for age. Electronically Signed   By: Genevie Ann M.D.   On: 06/06/2020 10:03   DG Foot Complete Left  Result Date: 06/06/2020 CLINICAL DATA:  55 year old female status post fall this morning. Abrasions, pain. EXAM: LEFT FOOT - COMPLETE 3+ VIEW COMPARISON:  None. FINDINGS: Bone mineralization is within normal limits. Healed chronic fracture of the 3rd proximal phalanx. Other phalanges appear intact. Tarsals and metatarsals appear intact and normally aligned. Normal joint spaces. No discrete soft tissue injury. IMPRESSION: No acute fracture or dislocation  identified about the left foot. Electronically Signed   By: Genevie Ann M.D.   On: 06/06/2020 10:04    Assessment & Plan:    Walker Kehr, MD

## 2020-07-23 NOTE — Assessment & Plan Note (Signed)
Claritin is offered - pt declined Mucinex

## 2020-07-23 NOTE — Assessment & Plan Note (Signed)
On B12 

## 2020-07-23 NOTE — Assessment & Plan Note (Signed)
On Vit D 

## 2020-07-23 NOTE — Assessment & Plan Note (Addendum)
Pulm ref Thick mucus MDI is offered - pt declined Mucinex

## 2020-07-23 NOTE — Assessment & Plan Note (Signed)
Due to head injury - remote

## 2020-07-31 ENCOUNTER — Other Ambulatory Visit (INDEPENDENT_AMBULATORY_CARE_PROVIDER_SITE_OTHER): Payer: BC Managed Care – PPO

## 2020-07-31 DIAGNOSIS — J4520 Mild intermittent asthma, uncomplicated: Secondary | ICD-10-CM

## 2020-07-31 DIAGNOSIS — E538 Deficiency of other specified B group vitamins: Secondary | ICD-10-CM | POA: Diagnosis not present

## 2020-07-31 DIAGNOSIS — I4719 Other supraventricular tachycardia: Secondary | ICD-10-CM

## 2020-07-31 DIAGNOSIS — I471 Supraventricular tachycardia: Secondary | ICD-10-CM

## 2020-07-31 DIAGNOSIS — R413 Other amnesia: Secondary | ICD-10-CM | POA: Diagnosis not present

## 2020-07-31 LAB — URINALYSIS
Bilirubin Urine: NEGATIVE
Hgb urine dipstick: NEGATIVE
Ketones, ur: NEGATIVE
Leukocytes,Ua: NEGATIVE
Nitrite: NEGATIVE
Specific Gravity, Urine: 1.015 (ref 1.000–1.030)
Total Protein, Urine: NEGATIVE
Urine Glucose: NEGATIVE
Urobilinogen, UA: 0.2 (ref 0.0–1.0)
pH: 7.5 (ref 5.0–8.0)

## 2020-07-31 LAB — LIPID PANEL
Cholesterol: 203 mg/dL — ABNORMAL HIGH (ref 0–200)
HDL: 54.7 mg/dL (ref >39.00)
LDL Cholesterol: 123 mg/dL — ABNORMAL HIGH (ref 0–99)
NonHDL: 148
Total CHOL/HDL Ratio: 4
Triglycerides: 126 mg/dL (ref 0.0–149.0)
VLDL: 25.2 mg/dL (ref 0.0–40.0)

## 2020-07-31 LAB — COMPREHENSIVE METABOLIC PANEL
ALT: 12 U/L (ref 0–35)
AST: 14 U/L (ref 0–37)
Albumin: 4.2 g/dL (ref 3.5–5.2)
Alkaline Phosphatase: 61 U/L (ref 39–117)
BUN: 13 mg/dL (ref 6–23)
CO2: 31 mEq/L (ref 19–32)
Calcium: 9.2 mg/dL (ref 8.4–10.5)
Chloride: 104 mEq/L (ref 96–112)
Creatinine, Ser: 0.68 mg/dL (ref 0.40–1.20)
GFR: 98.15 mL/min (ref >60.00)
Glucose, Bld: 90 mg/dL (ref 70–99)
Potassium: 3.9 mEq/L (ref 3.5–5.1)
Sodium: 141 mEq/L (ref 135–145)
Total Bilirubin: 0.4 mg/dL (ref 0.2–1.2)
Total Protein: 7 g/dL (ref 6.0–8.3)

## 2020-07-31 LAB — CBC WITH DIFFERENTIAL/PLATELET
Basophils Absolute: 0 10*3/uL (ref 0.0–0.1)
Basophils Relative: 0.1 % (ref 0.0–3.0)
Eosinophils Absolute: 0 10*3/uL (ref 0.0–0.7)
Eosinophils Relative: 0 % (ref 0.0–5.0)
HCT: 36.4 % (ref 36.0–46.0)
Hemoglobin: 12.2 g/dL (ref 12.0–15.0)
Lymphocytes Relative: 36.9 % (ref 12.0–46.0)
Lymphs Abs: 2.2 10*3/uL (ref 0.7–4.0)
MCHC: 33.6 g/dL (ref 30.0–36.0)
MCV: 87.5 fl (ref 78.0–100.0)
Monocytes Absolute: 0.4 10*3/uL (ref 0.1–1.0)
Monocytes Relative: 7.4 % (ref 3.0–12.0)
Neutro Abs: 3.3 10*3/uL (ref 1.4–7.7)
Neutrophils Relative %: 55.6 % (ref 43.0–77.0)
Platelets: 324 10*3/uL (ref 150.0–400.0)
RBC: 4.16 Mil/uL (ref 3.87–5.11)
RDW: 13.3 % (ref 11.5–15.5)
WBC: 5.9 10*3/uL (ref 4.0–10.5)

## 2020-07-31 LAB — TSH: TSH: 1.69 u[IU]/mL (ref 0.35–4.50)

## 2020-07-31 LAB — VITAMIN B12: Vitamin B-12: 506 pg/mL (ref 211–911)

## 2020-08-06 ENCOUNTER — Ambulatory Visit: Payer: BC Managed Care – PPO | Admitting: Family Medicine

## 2020-08-12 ENCOUNTER — Other Ambulatory Visit: Payer: Self-pay

## 2020-08-12 ENCOUNTER — Encounter: Payer: Self-pay | Admitting: Internal Medicine

## 2020-08-12 ENCOUNTER — Ambulatory Visit: Payer: BC Managed Care – PPO | Admitting: Internal Medicine

## 2020-08-12 DIAGNOSIS — B029 Zoster without complications: Secondary | ICD-10-CM | POA: Diagnosis not present

## 2020-08-12 DIAGNOSIS — E559 Vitamin D deficiency, unspecified: Secondary | ICD-10-CM

## 2020-08-12 MED ORDER — TRIAMCINOLONE ACETONIDE 0.1 % MT PSTE: PASTE | OROMUCOSAL | 12 refills | Status: DC

## 2020-08-12 MED ORDER — VALACYCLOVIR HCL 1 G PO TABS: 1000.0000 mg | ORAL_TABLET | Freq: Three times a day (TID) | ORAL | 0 refills | Status: DC

## 2020-08-12 NOTE — Progress Notes (Signed)
Subjective:  Patient ID: Regina [name], female    DOB: [birth year]  Age: 55 y.o. MRN: [id number]  CC: Office Visit (Right neck and jaw pain x3 weeks)   HPI Regina [name] presents for a R mouth ulcer on the roof of the mouth. C/o R HA, ST on the R  Outpatient Medications Prior to Visit  Medication Sig Dispense Refill  . albuterol (PROAIR HFA) 108 (90 Base) MCG/ACT inhaler Inhale 2 puffs into the lungs every 6 (six) hours as needed for wheezing or shortness of breath. 1 each 3  . Ascorbic Acid (VITAMIN C PO) Take by mouth.    . Cholecalciferol (VITAMIN D3) 125 MCG (5000 UT) TABS Take 10,000 Units by mouth daily.    . cyanocobalamin (,VITAMIN B-12,) 1000 MCG/ML injection Inject into the muscle.    . IVERMECTIN PO     . loratadine (CLARITIN) 10 MG tablet Take 1 tablet (10 mg total) by mouth daily. 90 tablet 5  . Multiple Vitamins-Minerals (ZINC PO) Take by mouth.    . Omega-3 Fatty Acids (FISH OIL PO) Take by mouth.    . Probiotic Product (PROBIOTIC PO) Take by mouth.    . SYRINGE-NEEDLE, DISP, 3 ML (BD ECLIPSE SYRINGE) 25G X 1" 3 ML MISC For SQ Vit B12 shots 50 each 3  . Turmeric 500 MG TABS Take by mouth 2 (two) times daily.    Marland Kitchen UNABLE TO FIND Immunity booster    . VITAMIN K PO Take 1 tablet by mouth every other day.    Marland Kitchen ZINC GLUCONATE PO Take 1 tablet by mouth daily.     No facility-administered medications prior to visit.    ROS: Review of Systems  Constitutional: Positive for fatigue. Negative for activity change, appetite change, chills and unexpected weight change.  HENT: Negative for congestion, mouth sores and sinus pressure.   Eyes: Negative for visual disturbance.  Respiratory: Negative for cough and chest tightness.   Gastrointestinal: Negative for abdominal pain and nausea.  Genitourinary: Negative for difficulty urinating, frequency and vaginal pain.  Musculoskeletal: Positive for arthralgias. Negative for back pain and gait problem.  Skin: Negative for pallor  and rash.  Neurological: Negative for dizziness, tremors, weakness, numbness and headaches.  Psychiatric/Behavioral: Negative for confusion and sleep disturbance.    Objective:  BP 114/60 (BP Location: Left Arm, Patient Position: Sitting, Cuff Size: Large)   Pulse 75   Temp 98.1 F (36.7 C) (Oral)   Ht 5\' 4"  (1.626 m)   Wt 174 lb (78.9 kg)   SpO2 99%   BMI 29.87 kg/m   BP Readings from Last 3 Encounters:  08/12/20 114/60  07/23/20 100/70  07/09/20 110/74    Wt Readings from Last 3 Encounters:  08/12/20 174 lb (78.9 kg)  07/23/20 174 lb (78.9 kg)  07/09/20 177 lb (80.3 kg)    Physical Exam Constitutional:      General: She is not in acute distress.    Appearance: She is well-developed. She is obese.  HENT:     Head: Normocephalic.     Right Ear: External ear normal.     Left Ear: External ear normal.     Nose: Nose normal.  Eyes:     General:        Right eye: No discharge.        Left eye: No discharge.     Conjunctiva/sclera: Conjunctivae normal.     Pupils: Pupils are equal, round, and reactive to light.  Neck:  Thyroid: No thyromegaly.     Vascular: No JVD.     Trachea: No tracheal deviation.  Cardiovascular:     Rate and Rhythm: Normal rate and regular rhythm.     Heart sounds: Normal heart sounds.  Pulmonary:     Effort: No respiratory distress.     Breath sounds: No stridor. No wheezing.  Abdominal:     General: Bowel sounds are normal. There is no distension.     Palpations: Abdomen is soft. There is no mass.     Tenderness: There is no abdominal tenderness. There is no guarding or rebound.  Musculoskeletal:        General: No tenderness.     Cervical back: Normal range of motion and neck supple.  Lymphadenopathy:     Cervical: No cervical adenopathy.  Skin:    Findings: No erythema or rash.  Neurological:     Cranial Nerves: No cranial nerve deficit.     Motor: No abnormal muscle tone.     Coordination: Coordination normal.     Deep  Tendon Reflexes: Reflexes normal.  Psychiatric:        Behavior: Behavior normal.        Thought Content: Thought content normal.        Judgment: Judgment normal.    Small healing ulcers on the right side of the hard palate and on the tip of the tongue to the right and 1 on the right lower gumline Lab Results  Component Value Date   WBC 5.9 07/31/2020   HGB 12.2 07/31/2020   HCT 36.4 07/31/2020   PLT 324.0 07/31/2020   GLUCOSE 90 07/31/2020   CHOL 203 (H) 07/31/2020   TRIG 126.0 07/31/2020   HDL 54.70 07/31/2020   LDLCALC 123 (H) 07/31/2020   ALT 12 07/31/2020   AST 14 07/31/2020   NA 141 07/31/2020   K 3.9 07/31/2020   CL 104 07/31/2020   CREATININE 0.68 07/31/2020   BUN 13 07/31/2020   CO2 31 07/31/2020   TSH 1.69 07/31/2020   INR 1.16 06/29/2015   HGBA1C 5.4 08/23/2018    DG Knee Complete 4 Views Left  Result Date: 06/06/2020 CLINICAL DATA:  55 year old female status post fall this morning. Abrasions, pain. EXAM: LEFT KNEE - COMPLETE 4+ VIEW COMPARISON:  None. FINDINGS: Bone mineralization is within normal limits. No evidence of fracture, dislocation, or joint effusion. Minimal lateral compartment degenerative spurring. No acute osseous abnormality identified. No discrete soft tissue injury. IMPRESSION: Negative for age. Electronically Signed   By: Genevie Ann M.D.   On: 06/06/2020 10:03   DG Foot Complete Left  Result Date: 06/06/2020 CLINICAL DATA:  55 year old female status post fall this morning. Abrasions, pain. EXAM: LEFT FOOT - COMPLETE 3+ VIEW COMPARISON:  None. FINDINGS: Bone mineralization is within normal limits. Healed chronic fracture of the 3rd proximal phalanx. Other phalanges appear intact. Tarsals and metatarsals appear intact and normally aligned. Normal joint spaces. No discrete soft tissue injury. IMPRESSION: No acute fracture or dislocation identified about the left foot. Electronically Signed   By: Genevie Ann M.D.   On: 06/06/2020 10:04    Assessment &  Plan:     Follow-up: No follow-ups on file.  Walker Kehr, MD

## 2020-08-12 NOTE — Assessment & Plan Note (Addendum)
Probable oral Empiric Valtrex Triamc paste

## 2020-08-12 NOTE — Assessment & Plan Note (Signed)
On Vit D 

## 2020-08-13 ENCOUNTER — Encounter: Payer: Self-pay | Admitting: Internal Medicine

## 2020-09-03 ENCOUNTER — Ambulatory Visit: Payer: BC Managed Care – PPO | Admitting: Family Medicine

## 2020-09-03 ENCOUNTER — Encounter: Payer: Self-pay | Admitting: Family Medicine

## 2020-09-03 VITALS — BP 102/62 | HR 92 | Ht 64.0 in | Wt 176.0 lb

## 2020-09-03 DIAGNOSIS — E042 Nontoxic multinodular goiter: Secondary | ICD-10-CM

## 2020-09-03 DIAGNOSIS — M222X1 Patellofemoral disorders, right knee: Secondary | ICD-10-CM

## 2020-09-03 DIAGNOSIS — M5441 Lumbago with sciatica, right side: Secondary | ICD-10-CM | POA: Diagnosis not present

## 2020-09-03 DIAGNOSIS — M2550 Pain in unspecified joint: Secondary | ICD-10-CM | POA: Diagnosis not present

## 2020-09-03 DIAGNOSIS — G8929 Other chronic pain: Secondary | ICD-10-CM

## 2020-09-03 LAB — CBC WITH DIFFERENTIAL/PLATELET
Basophils Absolute: 0 10*3/uL (ref 0.0–0.1)
Basophils Relative: 0.1 % (ref 0.0–3.0)
Eosinophils Absolute: 0 10*3/uL (ref 0.0–0.7)
Eosinophils Relative: 0.1 % (ref 0.0–5.0)
HCT: 37.5 % (ref 36.0–46.0)
Hemoglobin: 12.8 g/dL (ref 12.0–15.0)
Lymphocytes Relative: 32.7 % (ref 12.0–46.0)
Lymphs Abs: 1.7 10*3/uL (ref 0.7–4.0)
MCHC: 34 g/dL (ref 30.0–36.0)
MCV: 87 fl (ref 78.0–100.0)
Monocytes Absolute: 0.3 10*3/uL (ref 0.1–1.0)
Monocytes Relative: 6.6 % (ref 3.0–12.0)
Neutro Abs: 3.2 10*3/uL (ref 1.4–7.7)
Neutrophils Relative %: 60.5 % (ref 43.0–77.0)
Platelets: 326 10*3/uL (ref 150.0–400.0)
RBC: 4.31 Mil/uL (ref 3.87–5.11)
RDW: 13.1 % (ref 11.5–15.5)
WBC: 5.3 10*3/uL (ref 4.0–10.5)

## 2020-09-03 LAB — COMPREHENSIVE METABOLIC PANEL
ALT: 14 U/L (ref 0–35)
AST: 16 U/L (ref 0–37)
Albumin: 4.4 g/dL (ref 3.5–5.2)
Alkaline Phosphatase: 64 U/L (ref 39–117)
BUN: 15 mg/dL (ref 6–23)
CO2: 27 mEq/L (ref 19–32)
Calcium: 9.5 mg/dL (ref 8.4–10.5)
Chloride: 103 mEq/L (ref 96–112)
Creatinine, Ser: 0.72 mg/dL (ref 0.40–1.20)
GFR: 94.16 mL/min (ref >60.00)
Glucose, Bld: 80 mg/dL (ref 70–99)
Potassium: 3.7 mEq/L (ref 3.5–5.1)
Sodium: 141 mEq/L (ref 135–145)
Total Bilirubin: 0.6 mg/dL (ref 0.2–1.2)
Total Protein: 7.2 g/dL (ref 6.0–8.3)

## 2020-09-03 LAB — TSH: TSH: 1.08 u[IU]/mL (ref 0.35–4.50)

## 2020-09-03 LAB — C-REACTIVE PROTEIN: CRP: <1 mg/dL (ref 0.5–20.0)

## 2020-09-03 LAB — T4, FREE: Free T4: 0.96 ng/dL (ref 0.60–1.60)

## 2020-09-03 LAB — T3, FREE: T3, Free: 3.5 pg/mL (ref 2.3–4.2)

## 2020-09-03 LAB — SEDIMENTATION RATE: Sed Rate: 18 mm/hr (ref 0–30)

## 2020-09-03 NOTE — Assessment & Plan Note (Signed)
X-rays show that patient only has some mild facet disease at L4-S1.  Will likely respond well to conservative therapy.  We will hold on a significant number of medications.  We will get some labs to make sure nothing else is contributing.  Follow-up again in 6 to 8 weeks

## 2020-09-03 NOTE — Assessment & Plan Note (Signed)
History of thyroid nodules.  Has had screening done previously.  With patient's recent possible shingles in the mouth and some mild anterior cervical lymphadenopathy will get laboratory work-up again in order today.  Encourage patient to discuss with ENT and primary care if this continues and if other possible work-up is necessary.

## 2020-09-03 NOTE — Progress Notes (Signed)
[place] Phone: [phone] Subjective:   Regina [name], am serving as a scribe for Dr. Hulan Saas. This visit occurred during the SARS-CoV-2 public health emergency.  Safety protocols were in place, including screening questions prior to the visit, additional usage of staff PPE, and extensive cleaning of exam room while observing appropriate contact time as indicated for disinfecting solutions.   I'm seeing this patient by the request  of:  Plotnikov, [name], MD  CC: Multiple complaints follow-up  [id number]   07/09/2020 After fall.  Could be contributing to some of the discomfort and pain.  May be potentially a very mild olecranon neuritis could be contributing to some of it.  Discussed potential compression sleeve, follow-up again 6 weeks  Chronic problem with exacerbation after fall.  We will continue to monitor.  We will get x-rays to further evaluate.  [name].  Likely exacerbation of more patient's fibromyalgia.  [name] significant medication changes.  Home exercises given today.  We will continue to monitor.  Follow-up with me again in 4 to 6 weeks  Update 09/03/2020 Regina [name] is a 55 y.o. female coming in with complaint of right elbow, back pain, and ankle pain. Patient states that she has not made much progress as she was not able to do any of the recommended treatments.  Patient has had a lot of stress recently.  Patient recently also had shingles that appeared to be on her face as well as in her mouth.  Patient states that she continues to have some neck pain secondary to it.  Patient is also under a lot of stress with her in the process of building a new house and recently sold her house and is in the process of selling her ailing parents house.  Xray 07/09/2020 IMPRESSION: Mild facet disease at L4-L5 and L5-S1.    Past Medical History:  Diagnosis Date  . Allergic urticaria   .  ARTHRALGIA   . Arthritis   . Atrial fibrillation ([place])   . B12 DEFICIENCY   . CFS (chronic fatigue syndrome)   . Endometriosis   . Epidural hemorrhage with loss of consciousness ([facility] [employer]) 06/29/15  . Fibroid   . Fibromyalgia   . Fracture of cervical vertebra, C6 ([place]) 06/29/15  . Gastritis   . GERD   . Heart murmur   . History of endometriosis   . IBS (irritable bowel syndrome)   . Internal hemorrhoids   . MVP (mitral valve prolapse)   . Osteoarthritis   . Rosacea   . Skin cancer of arm, right   . Thoracic compression fracture ([facility]) 06/29/15   T2-T6   Past Surgical History:  Procedure Laterality Date  . ABDOMINAL HYSTERECTOMY     partial  . ABDOMINAL HYSTERECTOMY    . ANAL FISSURE REPAIR     Dr Rise [name] 2009  . APPENDECTOMY    . COLPOSCOPY    . HEMORRHOID SURGERY     Dr. Rise [name] 2009  . OVARIAN CYST REMOVAL    . RT BUNIONECTOMT    . TONSILLECTOMY AND ADENOIDECTOMY    . TUBAL LIGATION    . UTERINE FIBROID SURGERY     Social History   Socioeconomic History  . Marital status: Married    Spouse name: Not on file  . Number of children: Not on file  . Years of education: Not on file  . Highest education level: Not on file  Occupational  History  . Not on file  Tobacco Use  . Smoking status: Former Smoker    Quit date: 10/31/1988    Years since quitting: 31.8  . Smokeless tobacco: Never Used  Vaping Use  . Vaping Use: Never used  Substance and Sexual Activity  . Alcohol use: Yes    Alcohol/week: 1.0 - 2.0 standard drink    Types: 1 - 2 Standard drinks or equivalent per week    Comment: occasional   . Drug use: [name]  . Sexual activity: Yes    Partners: Male    Birth control/protection: Surgical    Comment: hysterectomy  Other Topics Concern  . Not on file  Social History Narrative   ** Merged History Encounter **       Working 2 jobs, 6d/wk      Regular exercise-[name]      Divorced   Social Determinants of Radio broadcast assistant Strain: Not on file  Food  Insecurity: Not on file  Transportation Needs: Not on file  Physical Activity: Not on file  Stress: Not on file  Social Connections: Not on file   Allergies  Allergen Reactions  . Indocin [Indomethacin] Swelling, Rash and Other (See Comments)    REACTION: lupus like illness; butterfly rash, pain and swelling all over body.   . Morphine Sulfate Nausea And Vomiting  . Sulfacetamide Sodium Swelling  . Caffeine Other (See Comments)    Induces A-fib  . Morphine And Related     nausea  . Sulfa Antibiotics Swelling  . Erythromycin Stearate Hives and Swelling    REACTION: all mycins   Family History  Problem Relation Age of Onset  . Lung cancer Mother   . Thyroid cancer Mother   . Cancer Mother        INTESTINAL  . Healthy Father   . Diabetes Other        uncles/aunts  . Von Willebrand disease Daughter   . Breast cancer Paternal Aunt   . Asthma Neg Hx       Current Outpatient Medications (Respiratory):  .  albuterol (PROAIR HFA) 108 (90 Base) MCG/ACT inhaler, Inhale 2 puffs into the lungs every 6 (six) hours as needed for wheezing or shortness of breath. .  loratadine (CLARITIN) 10 MG tablet, Take 1 tablet (10 mg total) by mouth daily.   Current Outpatient Medications (Hematological):  .  cyanocobalamin (,VITAMIN B-12,) 1000 MCG/ML injection, Inject into the muscle.  Current Outpatient Medications (Other):  Marland Kitchen  Ascorbic Acid (VITAMIN C PO), Take by mouth. .  Cholecalciferol (VITAMIN D3) 125 MCG (5000 UT) TABS, Take 10,000 Units by mouth daily. .  IVERMECTIN PO,  .  Multiple Vitamins-Minerals (ZINC PO), Take by mouth. .  Omega-3 Fatty Acids (FISH OIL PO), Take by mouth. .  Probiotic Product (PROBIOTIC PO), Take by mouth. .  SYRINGE-NEEDLE, DISP, 3 ML (BD ECLIPSE SYRINGE) 25G X 1" 3 ML MISC, For SQ Vit B12 shots .  triamcinolone (KENALOG) 0.1 % paste, Use qid prn on oral lesions .  Turmeric 500 MG TABS, Take by mouth 2 (two) times daily. Marland Kitchen  UNABLE TO FIND, Immunity  booster .  valACYclovir (VALTREX) 1000 MG tablet, Take 1 tablet (1,000 mg total) by mouth 3 (three) times daily. Marland Kitchen  VITAMIN K PO, Take 1 tablet by mouth every other day. Marland Kitchen  ZINC GLUCONATE PO, Take 1 tablet by mouth daily.   Reviewed prior external information including notes and imaging from  primary care provider As  well as notes that were available from care everywhere and other healthcare systems.  Past medical history, social, surgical and family history all reviewed in electronic medical record.  [name] pertanent information unless stated regarding to the chief complaint.   Review of Systems:  [name] headache, visual changes, nausea, vomiting, diarrhea, constipation, dizziness, abdominal pain, skin rash, fevers, chills, night sweats, weight loss, swollen lymph nodes, joint swelling, chest pain, shortness of breath, mood changes. POSITIVE muscle aches, body aches  Objective  Blood pressure 102/62, pulse 92, height 5\' 4"  (1.626 m), weight 176 lb (79.8 kg), SpO2 97 %.   General: [name] apparent distress alert and oriented x3 mood and affect normal, dressed appropriately.  HEENT: Pupils equal, extraocular movements intact patient does have fullness noted of the thyroid gland.  [name] true masses appreciated.  Mild anterior cervical lymphadenopathy noted. Respiratory: Patient's speak in full sentences and does not appear short of breath  Cardiovascular: [name] lower extremity edema, non tender, [name] erythema  Gait normal with good balance and coordination.  MSK:  Right knee exam still has some mild lateral tracking noted.  Some mild crepitus noted.  Patient is still tender to palpation in multiple different areas.  Ankles do have good range of motion noted today.  Patient does have lines noted from patient's socks but [name] significant swelling noted at the moment.  Back exam patient is comfortable in the chair at the moment but does seem to have some loss of lordosis.  5 out of 5 strength in lower extremities.    Impression and Recommendations:     The above documentation has been reviewed and is accurate and complete Lyndal Pulley, DO

## 2020-09-03 NOTE — Patient Instructions (Addendum)
Labs Keep appt with ENT for throat Let's get into PT Take time for yourself See me in 7-8 weeks

## 2020-09-03 NOTE — Assessment & Plan Note (Signed)
Patient has not had time for herself.  Lots of potential difficulty with stress related.  Patient has had some difficulty with her ailing parents as well as in the process of moving.  Encourage patient to start with formal physical therapy which I think patient will do very well with.  Patient is going to start to carve out more time for herself.  Follow-up again in 7 to 8 weeks

## 2020-09-04 ENCOUNTER — Telehealth: Payer: Self-pay | Admitting: Internal Medicine

## 2020-09-04 DIAGNOSIS — E049 Nontoxic goiter, unspecified: Secondary | ICD-10-CM

## 2020-09-04 NOTE — Telephone Encounter (Signed)
    Patient requesting order for thyroid scan. She states thyroid somewhat enlarged

## 2020-09-18 ENCOUNTER — Ambulatory Visit: Payer: BC Managed Care – PPO | Admitting: Family Medicine

## 2020-09-20 NOTE — Telephone Encounter (Signed)
Notified pt MD has order scan will be contacted once appt has been made.Regina KitchenJohny Chess

## 2020-09-20 NOTE — Telephone Encounter (Signed)
Okay.  Thanks.

## 2020-10-09 ENCOUNTER — Ambulatory Visit
Admission: RE | Admit: 2020-10-09 | Discharge: 2020-10-09 | Disposition: A | Discharge status: Home / Self Care | Payer: BC Managed Care – PPO | Source: Ambulatory Visit | Attending: Internal Medicine | Admitting: Internal Medicine

## 2020-10-22 ENCOUNTER — Ambulatory Visit: Payer: BC Managed Care – PPO | Admitting: Family Medicine

## 2020-10-23 ENCOUNTER — Ambulatory Visit: Payer: BC Managed Care – PPO | Admitting: Pulmonary Disease

## 2020-10-23 ENCOUNTER — Encounter: Payer: Self-pay | Admitting: Pulmonary Disease

## 2020-10-23 ENCOUNTER — Other Ambulatory Visit: Payer: Self-pay

## 2020-10-23 VITALS — BP 104/70 | HR 60 | Temp 98.1°F | Ht 64.0 in | Wt 174.0 lb

## 2020-10-23 DIAGNOSIS — J3089 Other allergic rhinitis: Secondary | ICD-10-CM

## 2020-10-23 DIAGNOSIS — R0683 Snoring: Secondary | ICD-10-CM

## 2020-10-23 NOTE — Progress Notes (Signed)
Subjective:    Patient ID: Regina [name], female    DOB: 1965/10/28, 55 y.o.   MRN: [id number]  HPI  Chief Complaint  Patient presents with   Consult    Patient states that she normally gets bronchitis a few times a year, states sometimes she does wheeze, denies shortness of breath unless going upstairs. Patient states that she snores. Does not have trouble going to sleep. Wakes up about twice a night.     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 She makes it clear right at the outset that she prefers natural medications to prescription medications.  She reports frequent nocturnal awakenings and nonrefreshing sleep.  Snoring has been noted by her husband.  Epworth sleepiness score is 8 and she reports some sleepiness while watching TV or as a passenger in a car but she denies daytime naps. She reports chronic pain after a fall from a height in 2017 which caused spine and skull fracture.  Bedtime is between 9 and 10 PM, sleep latency is minimal, she sleeps on her side with 1 pillow, reports waking up every 2 hours due to pain and occasional nocturia and is out of bed at 4:30 AM feeling rested with dryness of mouth and occasional headaches There is no history suggestive of cataplexy, sleep paralysis or parasomnias  She reports nasal drainage all year-round.  She reports recurrent bronchitis, last episode was in 2019.  She is not vaccinated and takes ivermectin  Chest x-ray 02/2018 was normal. CT coronaries 09/2017 showed calcium score of 0 and left upper lobe 2 mm nodule favored to be lymph node also noted in 2017        Past Medical History:  Diagnosis Date   Allergic urticaria    ARTHRALGIA    Arthritis    Atrial fibrillation ([facility])    B12 DEFICIENCY    CFS (chronic fatigue syndrome)    Endometriosis    Epidural hemorrhage with loss of consciousness ([place]) 06/29/15   Fibroid     Fibromyalgia    Fracture of cervical vertebra, C6 ([facility]) 06/29/15   Gastritis    GERD    Heart murmur    History of endometriosis    IBS (irritable bowel syndrome)    Internal hemorrhoids    MVP (mitral valve prolapse)    Osteoarthritis    Rosacea    Skin cancer of arm, right    Thoracic compression fracture ([place]) 06/29/15   T2-T6   Past Surgical History:  Procedure Laterality Date   ABDOMINAL HYSTERECTOMY     partial   ABDOMINAL HYSTERECTOMY     ANAL FISSURE REPAIR     Dr Rise [name] 2009   APPENDECTOMY     COLPOSCOPY     HEMORRHOID SURGERY     Dr. Rise [name] 2009   OVARIAN CYST REMOVAL     RT BUNIONECTOMT     TONSILLECTOMY AND ADENOIDECTOMY     TUBAL LIGATION     UTERINE FIBROID SURGERY      .all  Social History   Socioeconomic History   Marital status: Married    Spouse name: Not on file   Number of children: Not on file   Years of education: Not on file   Highest education level: Not on file  Occupational History   Not on file  Tobacco Use   Smoking status: Former    Types: Cigarettes    Quit date: 10/31/1988  Years since quitting: 32.0   Smokeless tobacco: Never  Vaping Use   Vaping Use: Never used  Substance and Sexual Activity   Alcohol use: Yes    Alcohol/week: 1.0 - 2.0 standard drink    Types: 1 - 2 Standard drinks or equivalent per week    Comment: occasional    Drug use: No   Sexual activity: Yes    Partners: Male    Birth control/protection: Surgical    Comment: hysterectomy  Other Topics Concern   Not on file  Social History Narrative   ** Merged History Encounter **       Working 2 jobs, 6d/wk      Regular exercise-no      Divorced   Social Determinants of Radio broadcast assistant Strain: Not on Art therapist Insecurity: Not on file  Transportation Needs: Not on file  Physical Activity: Not on file  Stress: Not on file  Social Connections: Not on file  Intimate Partner Violence: Not on file     Family History  Problem  Relation Age of Onset   Lung cancer Mother    Thyroid cancer Mother    Cancer Mother        INTESTINAL   Healthy Father    Diabetes Other        uncles/aunts   Von Willebrand disease Daughter    Breast cancer Paternal Aunt    Asthma Neg Hx      Review of Systems Complains of headaches Nasal congestion Stiff joints Acid heartburn and indigestion  Constitutional: negative for anorexia, fevers and sweats  Eyes: negative for irritation, redness and visual disturbance  Ears, nose, mouth, throat, and face: negative for earaches, epistaxis and sore throat  Respiratory: negative for cough, sputum and wheezing  Cardiovascular: negative for chest pain,  lower extremity edema, orthopnea, palpitations and syncope  Gastrointestinal: negative for abdominal pain, constipation, diarrhea, melena, nausea and vomiting  Genitourinary:negative for dysuria, frequency and hematuria  Hematologic/lymphatic: negative for bleeding, easy bruising and lymphadenopathy  Musculoskeletal:negative for arthralgias, muscle weakness  Neurological: negative for coordination problems, gait problems, headaches and weakness  Endocrine: negative for diabetic symptoms including polydipsia, polyuria and weight loss     Objective:   Physical Exam  Gen. Pleasant, obese, in no distress, normal affect ENT - no pallor,icterus, no post nasal drip, class 2 airway Neck: No JVD, no thyromegaly, no carotid bruits Lungs: no use of accessory muscles, no dullness to percussion, decreased without rales or rhonchi  Cardiovascular: Rhythm regular, heart sounds  normal, no murmurs or gallops, no peripheral edema Abdomen: soft and non-tender, no hepatosplenomegaly, BS normal. Musculoskeletal: No deformities, no cyanosis or clubbing Neuro:  alert, non focal, no tremors       Assessment & Plan:

## 2020-10-23 NOTE — Patient Instructions (Signed)
  Schedule home sleep test OK to take flonase OTC each nare OR store brand phenylephrine eg walphed (5-10 mg) for nasal congestion

## 2020-10-23 NOTE — Progress Notes (Signed)
[name] Phone: [phone] Subjective:   [name], am serving as a scribe for Dr. Hulan Saas. This visit occurred during the SARS-CoV-2 public health emergency.  Safety protocols were in place, including screening questions prior to the visit, additional usage of staff PPE, and extensive cleaning of exam room while observing appropriate contact time as indicated for disinfecting solutions.   I'm seeing this patient by the request  of:  Plotnikov, [name], MD  CC: Low back pain  [id number]  09/03/2020 History of thyroid nodules.  Has had screening done previously.  With patient's recent possible shingles in the mouth and some mild anterior cervical lymphadenopathy will get laboratory work-up again in order today.  Encourage patient to discuss with ENT and primary care if this continues and if other possible work-up is necessary.  X-rays show that patient only has some mild facet disease at L4-S1.  Will likely respond well to conservative therapy.  We will hold on a significant number of medications.  We will get some labs to make sure nothing else is contributing.  Follow-up again in 6 to 8 weeks  Patient has not had time for herself.  Lots of potential difficulty with stress related.  Patient has had some difficulty with her ailing parents as well as in the process of moving.  Encourage patient to start with formal physical therapy which I think patient will do very well with.  Patient is going to start to carve out more time for herself.  Follow-up again in 7 to 8 weeks  Update 10/24/2020 Regina [name] is a 55 y.o. female coming in with complaint of LBP and R knee pain. Patient states she has been unable to get to PT. Pt is fearing she will need to turn to work in [place] c/o a painful catch in R hip, aggravated by sitting, w/ radiating pain distally. Pt reports LBP is varying.       Past Medical  History:  Diagnosis Date   Allergic urticaria    ARTHRALGIA    Arthritis    Atrial fibrillation ([facility])    B12 DEFICIENCY    CFS (chronic fatigue syndrome)    Endometriosis    Epidural hemorrhage with loss of consciousness ([place]) 06/29/15   Fibroid    Fibromyalgia    Fracture of cervical vertebra, C6 ([facility]) 06/29/15   Gastritis    GERD    Heart murmur    History of endometriosis    IBS (irritable bowel syndrome)    Internal hemorrhoids    MVP (mitral valve prolapse)    Osteoarthritis    Rosacea    Skin cancer of arm, right    Thoracic compression fracture ([place]) 06/29/15   T2-T6   Past Surgical History:  Procedure Laterality Date   ABDOMINAL HYSTERECTOMY     partial   ABDOMINAL HYSTERECTOMY     ANAL FISSURE REPAIR     Dr Rise [name] 2009   APPENDECTOMY     COLPOSCOPY     HEMORRHOID SURGERY     Dr. Rise [name] 2009   OVARIAN CYST REMOVAL     RT BUNIONECTOMT     TONSILLECTOMY AND ADENOIDECTOMY     TUBAL LIGATION     UTERINE FIBROID SURGERY     Social History   Socioeconomic History   Marital status: Married    Spouse name: Not on file   Number of children: Not on file   Years  of education: Not on file   Highest education level: Not on file  Occupational History   Not on file  Tobacco Use   Smoking status: Former    Types: Cigarettes    Quit date: 10/31/1988    Years since quitting: 32.0   Smokeless tobacco: Never  Vaping Use   Vaping Use: Never used  Substance and Sexual Activity   Alcohol use: Yes    Alcohol/week: 1.0 - 2.0 standard drink    Types: 1 - 2 Standard drinks or equivalent per week    Comment: occasional    Drug use: No   Sexual activity: Yes    Partners: Male    Birth control/protection: Surgical    Comment: hysterectomy  Other Topics Concern   Not on file  Social History Narrative   ** Merged History Encounter **       Working 2 jobs, 6d/wk      Regular exercise-no      Divorced   Social Determinants of Radio broadcast assistant Strain:  Not on file  Food Insecurity: Not on file  Transportation Needs: Not on file  Physical Activity: Not on file  Stress: Not on file  Social Connections: Not on file   Allergies  Allergen Reactions   Indocin [Indomethacin] Swelling, Rash and Other (See Comments)    REACTION: lupus like illness; butterfly rash, pain and swelling all over body.    Morphine Sulfate Nausea And Vomiting   Sulfacetamide Sodium Swelling   Caffeine Other (See Comments)    Induces A-fib   Morphine And Related     nausea   Sulfa Antibiotics Swelling   Erythromycin Stearate Hives and Swelling    CAN TAKE Z-PAK   Family History  Problem Relation Age of Onset   Lung cancer Mother    Thyroid cancer Mother    Cancer Mother        INTESTINAL   Healthy Father    Diabetes Other        uncles/aunts   Von Willebrand disease Daughter    Breast cancer Paternal Aunt    Asthma Neg Hx       Current Outpatient Medications (Respiratory):    albuterol (PROAIR HFA) 108 (90 Base) MCG/ACT inhaler, Inhale 2 puffs into the lungs every 6 (six) hours as needed for wheezing or shortness of breath.   loratadine (CLARITIN) 10 MG tablet, Take 1 tablet (10 mg total) by mouth daily.   Current Outpatient Medications (Hematological):    cyanocobalamin (,VITAMIN B-12,) 1000 MCG/ML injection, Inject into the muscle.  Current Outpatient Medications (Other):    Ascorbic Acid (VITAMIN C PO), Take by mouth.   Cholecalciferol (VITAMIN D3) 125 MCG (5000 UT) TABS, Take 10,000 Units by mouth daily.   IVERMECTIN PO,    Multiple Vitamins-Minerals (ZINC PO), Take by mouth.   Omega-3 Fatty Acids (FISH OIL PO), Take by mouth.   Probiotic Product (PROBIOTIC PO), Take by mouth.   SYRINGE-NEEDLE, DISP, 3 ML (BD ECLIPSE SYRINGE) 25G X 1" 3 ML MISC, For SQ Vit B12 shots   triamcinolone (KENALOG) 0.1 % paste, Use qid prn on oral lesions   Turmeric 500 MG TABS, Take by mouth 2 (two) times daily.   UNABLE TO FIND, Immunity booster   valACYclovir  (VALTREX) 1000 MG tablet, Take 1 tablet (1,000 mg total) by mouth 3 (three) times daily.   VITAMIN K PO, Take 1 tablet by mouth every other day.   ZINC GLUCONATE PO, Take 1 tablet by  mouth daily.   Reviewed prior external information including notes and imaging from  primary care provider As well as notes that were available from care everywhere and other healthcare systems.  Past medical history, social, surgical and family history all reviewed in electronic medical record.  No pertanent information unless stated regarding to the chief complaint.   Review of Systems:  No headache, visual changes, nausea, vomiting, diarrhea, constipation, dizziness, abdominal pain, skin rash, fevers, chills, night sweats, weight loss, swollen lymph nodes, body aches, joint swelling, chest pain, shortness of breath, mood changes. POSITIVE muscle aches  Objective  Blood pressure 104/70, pulse 98, height '5\' 4"'$  (1.626 m), weight 174 lb (78.9 kg), SpO2 (!) 60 %.   General: No apparent distress alert and oriented x3 mood and affect normal, dressed appropriately.  HEENT: Pupils equal, extraocular movements intact  Respiratory: Patient's speak in full sentences and does not appear short of breath  Cardiovascular: No lower extremity edema, non tender, no erythema  Gait normal with good balance and coordination.  MSK:  Non tender with full range of motion and good stability and symmetric strength and tone of shoulders, elbows, wrist, hip, knee and ankles bilaterally.  Low back exam still shows the patient is some tenderness to palpation of the paraspinal musculature right greater than left.  Tenderness over the right sacroiliac joint noted.  Mild positive FABER test on the right.  Negative straight leg test.   Impression and Recommendations:    The above documentation has been reviewed and is accurate and complete Lyndal Pulley, DO

## 2020-10-23 NOTE — Assessment & Plan Note (Signed)
Given excessive daytime fatigue, narrow pharyngeal exam & loud snoring, obstructive sleep apnea is possible & an overnight polysomnogram will be scheduled as a home study. The pathophysiology of obstructive sleep apnea , it's cardiovascular consequences & modes of treatment including CPAP were discused with the patient in detail & they evidenced understanding.  Pretest probability is low to intermediate.  Her frequent nocturnal awakenings appear to be related to chronic pain issues and she prefers not to take medications for this

## 2020-10-23 NOTE — Assessment & Plan Note (Signed)
We discussed antihistaminic, nasal steroid such as Flonase OTC each nare and if really severe then she can take a decongestant such as store brand phenylephrine, discussed side effects of each Her recurrent bronchitis has not occurred in the last 3 years so do not feel further investigation is necessary at this time

## 2020-10-24 ENCOUNTER — Ambulatory Visit: Payer: BC Managed Care – PPO | Admitting: Family Medicine

## 2020-10-24 ENCOUNTER — Other Ambulatory Visit: Payer: Self-pay

## 2020-10-24 DIAGNOSIS — G8929 Other chronic pain: Secondary | ICD-10-CM

## 2020-10-24 DIAGNOSIS — M5441 Lumbago with sciatica, right side: Secondary | ICD-10-CM | POA: Diagnosis not present

## 2020-10-24 NOTE — Patient Instructions (Addendum)
Good to see you  Continue working on your home exercises  If life slows down and you want to try PT, please let me know  Thanks for keeping me fed!  See me again in  6 weeks

## 2020-10-24 NOTE — Assessment & Plan Note (Signed)
Continues to have pain, noncompliant with exercises and PT. SDOH taking care of mom and no time.  Tried mild OMT on the right SI joint and responded well  RTC in 6 weeks

## 2020-11-28 ENCOUNTER — Ambulatory Visit (INDEPENDENT_AMBULATORY_CARE_PROVIDER_SITE_OTHER): Payer: BC Managed Care – PPO | Admitting: Family Medicine

## 2020-11-28 ENCOUNTER — Encounter: Payer: Self-pay | Admitting: Family Medicine

## 2020-11-28 ENCOUNTER — Other Ambulatory Visit: Payer: Self-pay

## 2020-11-28 DIAGNOSIS — M5412 Radiculopathy, cervical region: Secondary | ICD-10-CM | POA: Diagnosis not present

## 2020-11-28 DIAGNOSIS — M9902 Segmental and somatic dysfunction of thoracic region: Secondary | ICD-10-CM

## 2020-11-28 NOTE — Progress Notes (Signed)
[name] [place] [address] [place] Phone: [phone] Subjective:   [name], am serving as a scribe for Dr. Hulan Saas. This visit occurred during the SARS-CoV-2 public health emergency.  Safety protocols were in place, including screening questions prior to the visit, additional usage of staff PPE, and extensive cleaning of exam room while observing appropriate contact time as indicated for disinfecting solutions.   I'm seeing this patient by the request  of:  Plotnikov, [name], MD  CC: Right-sided low back pain follow-up  [id number]  Regina [name] is a 55 y.o. female coming in with complaint of right-sided low back pain.  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  Patient states she is doing well, but has a new complaint of shooting pain down her left arm.       Past Medical History:  Diagnosis Date   Allergic urticaria    ARTHRALGIA    Arthritis    Atrial fibrillation (HCC)    B12 DEFICIENCY    CFS (chronic fatigue syndrome)    Endometriosis    Epidural hemorrhage with loss of consciousness ([place]) 06/29/15   Fibroid    Fibromyalgia    Fracture of cervical vertebra, C6 (HCC) 06/29/15   Gastritis    GERD    Heart murmur    History of endometriosis    IBS (irritable bowel syndrome)    Internal hemorrhoids    MVP (mitral valve prolapse)    Osteoarthritis    Rosacea    Skin cancer of arm, right    Thoracic compression fracture ([place]) 06/29/15   T2-T6   Past Surgical History:  Procedure Laterality Date   ABDOMINAL HYSTERECTOMY     partial   ABDOMINAL HYSTERECTOMY     ANAL FISSURE REPAIR     Dr Rise [name] 2009   APPENDECTOMY     COLPOSCOPY     HEMORRHOID SURGERY     Dr. Rise [name] 2009   OVARIAN CYST REMOVAL     RT BUNIONECTOMT      TONSILLECTOMY AND ADENOIDECTOMY     TUBAL LIGATION     UTERINE FIBROID SURGERY     Social History   Socioeconomic History   Marital status: Married    Spouse name: Not on file   Number of children: Not on file   Years of education: Not on file   Highest education level: Not on file  Occupational History   Not on file  Tobacco Use   Smoking status: Former    Types: Cigarettes    Quit date: 10/31/1988    Years since quitting: 32.0   Smokeless tobacco: Never  Vaping Use   Vaping Use: Never used  Substance and Sexual Activity   Alcohol use: Yes    Alcohol/week: 1.0 - 2.0 standard drink    Types: 1 - 2 Standard drinks or equivalent per week    Comment: occasional    Drug use: No   Sexual activity: Yes    Partners: Male    Birth control/protection: Surgical    Comment: hysterectomy  Other Topics Concern   Not on file  Social History Narrative   ** Merged History Encounter **       Working 2 jobs, 6d/wk      Regular exercise-no      Divorced   Social  Determinants of Health   Financial Resource Strain: Not on file  Food Insecurity: Not on file  Transportation Needs: Not on file  Physical Activity: Not on file  Stress: Not on file  Social Connections: Not on file   Allergies  Allergen Reactions   Indocin [Indomethacin] Swelling, Rash and Other (See Comments)    REACTION: lupus like illness; butterfly rash, pain and swelling all over body.    Morphine Sulfate Nausea And Vomiting   Sulfacetamide Sodium Swelling   Caffeine Other (See Comments)    Induces A-fib   Morphine And Related     nausea   Sulfa Antibiotics Swelling   Erythromycin Stearate Hives and Swelling    CAN TAKE Z-PAK   Family History  Problem Relation Age of Onset   Lung cancer Mother    Thyroid cancer Mother    Cancer Mother        INTESTINAL   Healthy Father    Diabetes Other        uncles/aunts   Von Willebrand disease Daughter    Breast cancer Paternal Aunt    Asthma Neg Hx        Current Outpatient Medications (Respiratory):    albuterol (PROAIR HFA) 108 (90 Base) MCG/ACT inhaler, Inhale 2 puffs into the lungs every 6 (six) hours as needed for wheezing or shortness of breath.   loratadine (CLARITIN) 10 MG tablet, Take 1 tablet (10 mg total) by mouth daily.   Current Outpatient Medications (Hematological):    cyanocobalamin (,VITAMIN B-12,) 1000 MCG/ML injection, Inject into the muscle.  Current Outpatient Medications (Other):    Ascorbic Acid (VITAMIN C PO), Take by mouth.   Cholecalciferol (VITAMIN D3) 125 MCG (5000 UT) TABS, Take 10,000 Units by mouth daily.   IVERMECTIN PO,    Multiple Vitamins-Minerals (ZINC PO), Take by mouth.   Omega-3 Fatty Acids (FISH OIL PO), Take by mouth.   Probiotic Product (PROBIOTIC PO), Take by mouth.   SYRINGE-NEEDLE, DISP, 3 ML (BD ECLIPSE SYRINGE) 25G X 1" 3 ML MISC, For SQ Vit B12 shots   triamcinolone (KENALOG) 0.1 % paste, Use qid prn on oral lesions   Turmeric 500 MG TABS, Take by mouth 2 (two) times daily.   UNABLE TO FIND, Immunity booster   valACYclovir (VALTREX) 1000 MG tablet, Take 1 tablet (1,000 mg total) by mouth 3 (three) times daily.   VITAMIN K PO, Take 1 tablet by mouth every other day.   ZINC GLUCONATE PO, Take 1 tablet by mouth daily.   Reviewed prior external information including notes and imaging from  primary care provider As well as notes that were available from care everywhere and other healthcare systems.  Past medical history, social, surgical and family history all reviewed in electronic medical record.  No pertanent information unless stated regarding to the chief complaint.   Review of Systems:  No headache, visual changes, nausea, vomiting, diarrhea, constipation, dizziness, abdominal pain, skin rash, fevers, chills, night sweats, weight loss, swollen lymph nodes, joint swelling, chest pain, shortness of breath, mood changes. POSITIVE muscle aches, body aches   Objective  Blood  pressure 90/64, pulse 68, height '5\' 4"'$  (1.626 m), weight 173 lb (78.5 kg), SpO2 97 %.   General: No apparent distress alert and oriented x3 mood and affect normal, dressed appropriately.  HEENT: Pupils equal, extraocular movements intact  Respiratory: Patient's speak in full sentences and does not appear short of breath  Cardiovascular: No lower extremity edema, non tender, no erythema  Gait normal  with good balance and coordination.  MSK:  upper back does have some tightness noted, pain out of proportion to amount of palpation, mild inferior dyskenesis noted of the scapula bilaterally.  Osteopathic findings T8 extended rotated and side bent left T6 extended rotated and side bent right    Impression and Recommendations:     The above documentation has been reviewed and is accurate and complete Lyndal Pulley, DO

## 2020-11-28 NOTE — Assessment & Plan Note (Signed)
Do believe that some of patient's pain is secondary to more cervical radiculopathy.  Responded well to osteopathic manipulation of more of the upper back.  Avoided any of the neck secondary to patient's history of neck injuries.  Patient will continue with all the other vitamin supplementations.  Patient has been noncompliant with home exercises and encouraged her to try to do them on a more regular basis.  Follow-up with me again in 4 to 8 weeks

## 2020-11-28 NOTE — Patient Instructions (Signed)
Good to see you! Do exercises 2-3 times a week Manipulation today See you again in 7-8 weeks

## 2020-11-28 NOTE — Assessment & Plan Note (Addendum)
DecreasedAttempted mild osteopathic manipulation.  Discussed which activities to do which wants to avoid.  We will monitor for any other type of signs and symptoms.  Follow little better to the manipulation.  Follow-up with me again 6 to 8 weeks

## 2020-12-09 ENCOUNTER — Encounter: Payer: Self-pay | Admitting: Pulmonary Disease

## 2020-12-27 ENCOUNTER — Encounter: Payer: Self-pay | Admitting: Nurse Practitioner

## 2020-12-27 ENCOUNTER — Telehealth (INDEPENDENT_AMBULATORY_CARE_PROVIDER_SITE_OTHER): Payer: BC Managed Care – PPO | Admitting: Nurse Practitioner

## 2020-12-27 ENCOUNTER — Other Ambulatory Visit: Payer: Self-pay

## 2020-12-27 VITALS — BP 100/60 | HR 67 | Ht 64.0 in | Wt 172.0 lb

## 2020-12-27 DIAGNOSIS — J0100 Acute maxillary sinusitis, unspecified: Secondary | ICD-10-CM

## 2020-12-27 MED ORDER — AMOXICILLIN-POT CLAVULANATE 875-125 MG PO TABS: 1.0000 | ORAL_TABLET | Freq: Two times a day (BID) | ORAL | 0 refills | Status: DC

## 2020-12-27 NOTE — Progress Notes (Signed)
Due to national recommendations of social distancing related to the [place] pandemic, an audio/visual tele-health visit was felt to be the most appropriate encounter type for this patient today. I connected with  Regina [name] on 12/27/20 utilizing audio/visual technology and verified that I am speaking with the correct person using two identifiers. The patient was located at their home, and I was located at the office of [place] at [facility] during the encounter. I discussed the limitations of evaluation and management by telemedicine. The patient expressed understanding and agreed to proceed.    Subjective:  Patient ID: Regina [name], female    DOB: 1965/12/17  Age: 55 y.o. MRN: [id number]  CC:  Chief Complaint  Patient presents with   URI       HPI  This patient arrives today for virtual visit for the above.  She tells me her symptoms started yesterday at 10 AM.  She has been feeling myalgias, chills, tells me she did spike a fever, sinus pressure, and headache.  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  She has not been vaccinated against COVID-19.  She took an at-home COVID-19 test yesterday and this was negative.  She did go to urgent care with [facility] earlier this morning and was tested for the flu which was negative, tested for UTI which was negative, and tested for strep throat which was negative.  They did order a PCR COVID-19 test but the results have not come back yet.  Past Medical History:  Diagnosis Date   Allergic urticaria    ARTHRALGIA    Arthritis    Atrial fibrillation (HCC)    B12 DEFICIENCY    CFS (chronic fatigue syndrome)    Endometriosis    Epidural hemorrhage with loss of consciousness (HCC) 06/29/15   Fibroid    Fibromyalgia    Fracture of cervical vertebra, C6 (HCC) 06/29/15   Gastritis    GERD    Heart murmur    History of endometriosis    IBS (irritable bowel syndrome)     Internal hemorrhoids    MVP (mitral valve prolapse)    Osteoarthritis    Rosacea    Skin cancer of arm, right    Thoracic compression fracture ([place]) 06/29/15   T2-T6      Family History  Problem Relation Age of Onset   Lung cancer Mother    Thyroid cancer Mother    Cancer Mother        INTESTINAL   Healthy Father    Diabetes Other        uncles/aunts   Von Willebrand disease Daughter    Breast cancer Paternal Aunt    Asthma Neg Hx     Social History   Social History Narrative   ** Merged History Encounter **       Working 2 jobs, 6d/wk      Regular exercise-no      Divorced   Social History   Tobacco Use   Smoking status: Former    Types: Cigarettes    Quit date: 10/31/1988    Years since quitting: 32.1   Smokeless tobacco: Never  Substance Use Topics   Alcohol use: Yes    Alcohol/week: 1.0 - 2.0 standard drink    Types: 1 - 2 Standard drinks or equivalent per week    Comment: occasional      Current Meds  Medication  Sig   amoxicillin-clavulanate (AUGMENTIN) 875-125 MG tablet Take 1 tablet by mouth 2 (two) times daily.   Ascorbic Acid (VITAMIN C PO) Take by mouth.   Cholecalciferol (VITAMIN D3) 125 MCG (5000 UT) TABS Take 10,000 Units by mouth daily.   cyanocobalamin (,VITAMIN B-12,) 1000 MCG/ML injection Inject into the muscle.   Magnesium 400 MG TABS Take 1 tablet by mouth daily.   Multiple Vitamins-Minerals (ZINC PO) Take by mouth.   Omega-3 Fatty Acids (FISH OIL PO) Take by mouth.   Probiotic Product (PROBIOTIC PO) Take by mouth.   Turmeric 500 MG TABS Take by mouth 2 (two) times daily.   UNABLE TO FIND Immunity booster   VITAMIN K PO Take 1 tablet by mouth every other day.   ZINC GLUCONATE PO Take 1 tablet by mouth daily.    ROS:  Review of Systems  Constitutional:  Positive for fever and malaise/fatigue.  HENT:  Positive for congestion, sinus pain and sore throat (mild).   Respiratory:  Negative for cough and shortness of breath.    Cardiovascular:  Negative for chest pain.  Gastrointestinal:  Negative for abdominal pain, diarrhea, nausea and vomiting.  Neurological:  Positive for headaches.    Objective:   Today's Vitals: BP 100/60   Pulse 67   Ht 5\' 4"  (1.626 m)   Wt 172 lb (78 kg)   SpO2 99%   BMI 29.52 kg/m  Vitals with BMI 12/27/2020 11/28/2020 10/24/2020  Height 5\' 4"  5\' 4"  5\' 4"   Weight 172 lbs 173 lbs 174 lbs  BMI 29.51 38.45 36.46  Systolic 803 90 212  Diastolic 60 64 70  Pulse 67 68 98  Some encounter information is confidential and restricted. Go to Review Flowsheets activity to see all data.     Physical Exam Comprehensive physical exam not completed today as office visit was conducted remotely.  Patient appeared fairly well over video, she was dressed appropriately, she was able to talk in complete sentences without having to stop to breathe, no cough was noted, she did sound mildly congested.  Patient was alert and oriented, and appeared to have appropriate judgment.       Assessment and Plan   1. Acute non-recurrent maxillary sinusitis      Plan: 1.  Etiology uncertain at this time it does sound like she may have sinus infection versus COVID-19 versus upper respiratory infection.  For now I recommended she do symptomatic treatment with hydration during the day, use of a Nettie pot for congestion, and use of Flonase nasal spray if needed for congestion.  We did discuss antiviral medication options if her COVID-19 test were to come positive as I do feel she be a candidate based on her age and that she reports she has "asthmatic tendencies" and is being evaluated for sleep apnea.  She was told if no one calls her and does not offer the antiviral she can call us first thing Monday morning and we can consider prescribing this for her.  As were going into the weekend I will also prescribe her Augmentin that she can take if her symptoms worsen (fever, headache, congestion) or if they persist longer  than 7 days.  I recommended she avoid taking the antibiotics now as were still trying to determine if her infection is viral or bacterial in origin.  She tells me she understands and is willing to hold off on the antibiotic for now.  She was told to quarantine at home until her results come  back.  If she is positive she was told to quarantine for 5 days from onset of symptoms, and after 5 days that she is feeling a little bit better she can return to going out in the community but needs to wear a mask for an additional 5 days.  She endorses her understanding. I also discussed this case with her PCP (Dr. Luther Redo) so that he is aware of the above.    Tests ordered No orders of the defined types were placed in this encounter.     Meds ordered this encounter  Medications   amoxicillin-clavulanate (AUGMENTIN) 875-125 MG tablet    Sig: Take 1 tablet by mouth 2 (two) times daily.    Dispense:  10 tablet    Refill:  0    Order Specific Question:   Supervising Provider    Answer:   Binnie Rail [id number]    Patient to follow-up as needed.   Ailene Ards, NP

## 2021-01-02 IMAGING — CT CT ABD-PELV W/ CM
2 of 5 series · 12 of 46 positions shown, 14 images · IV contrast (iopamidol)
Comparison: CT abdomen and pelvis-01/13/2012

CLINICAL DATA: Abdominal distention. Gastritis. Nausea and
constipation. History of previous hysterectomy, anal fissure repair,
ovarian cyst removal, fibroid surgery and appendectomy

EXAM:
CT ABDOMEN AND PELVIS WITH CONTRAST
TECHNIQUE: Multidetector CT imaging of the abdomen and pelvis was performed
using the standard protocol following bolus administration of
intravenous contrast.
CONTRAST:  100mL BAANUY-HVV IOPAMIDOL (BAANUY-HVV) INJECTION 61%

[Series 2: abd pelvis 5.00 br40 s3 axial · axial · 0.67mm/px · z∈[+1225,+1625]mm · 9 of 95 slices shown, 11 images]
[im 10/95  soft-tissue]
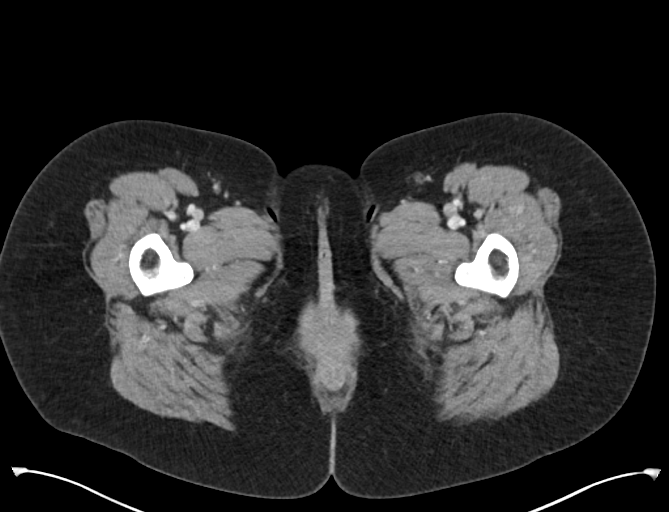
[im 10/95  bone]
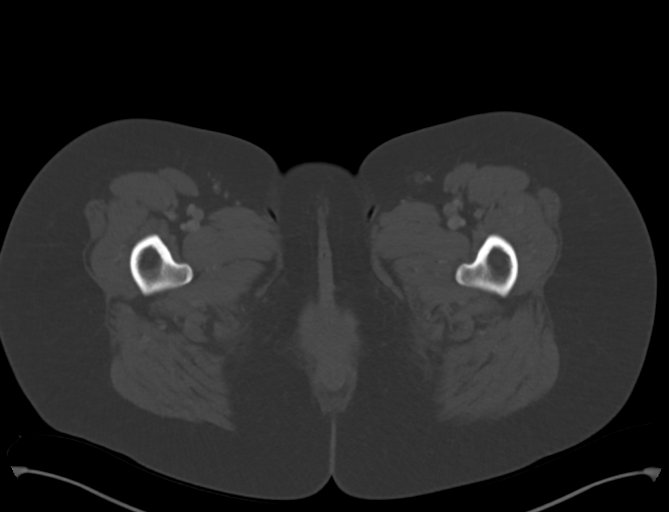
[im 20/95  soft-tissue]
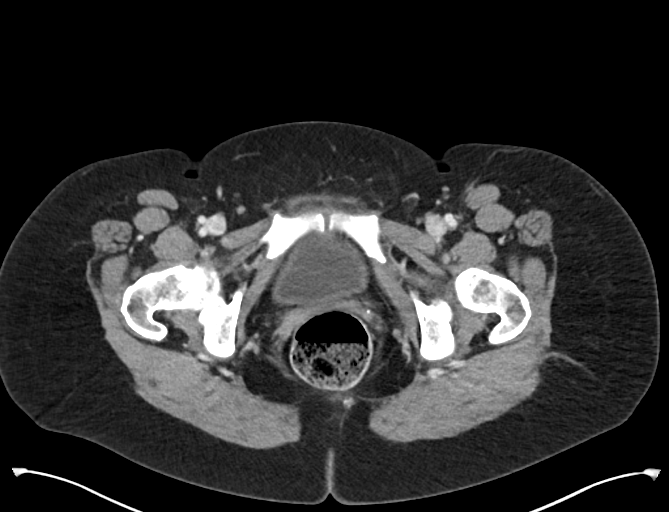
[im 30/95  soft-tissue]
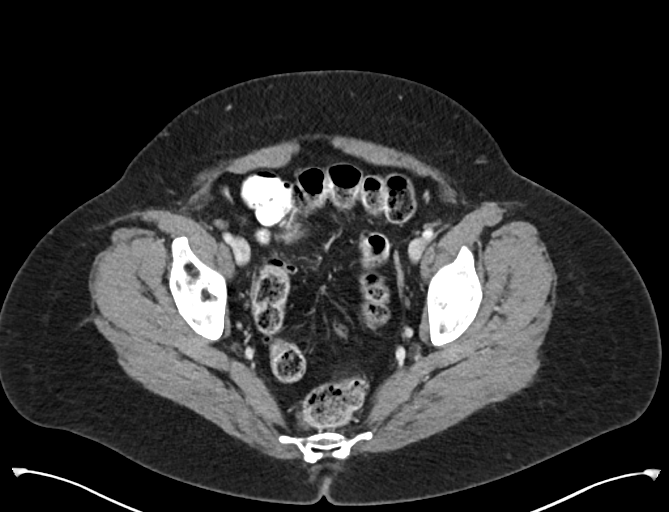
[im 40/95  soft-tissue]
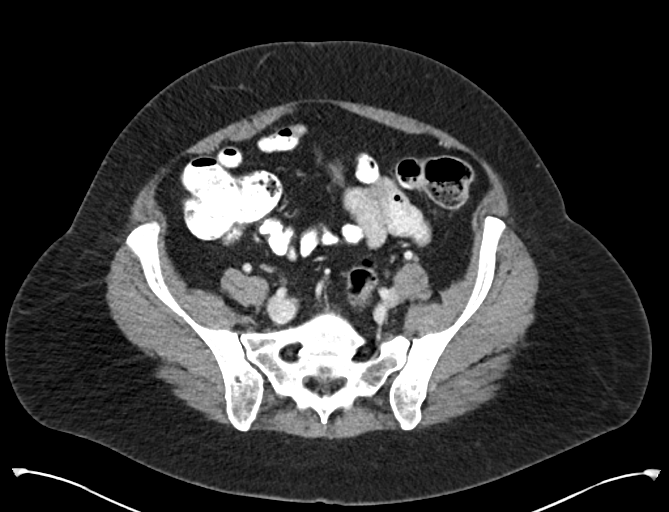
[im 50/95  soft-tissue]
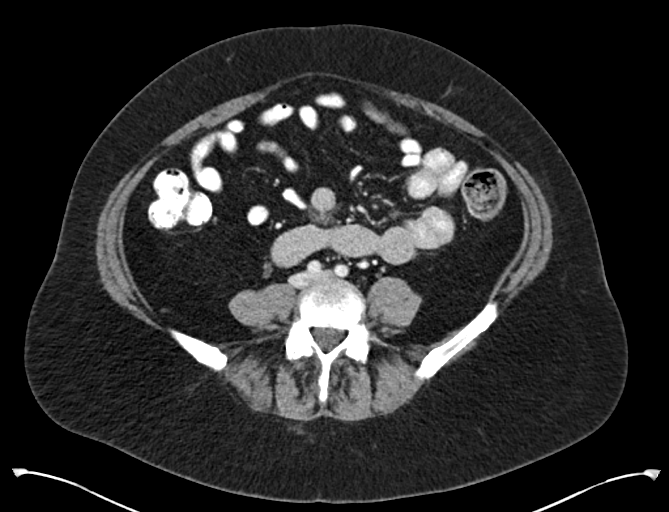
[im 60/95  soft-tissue]
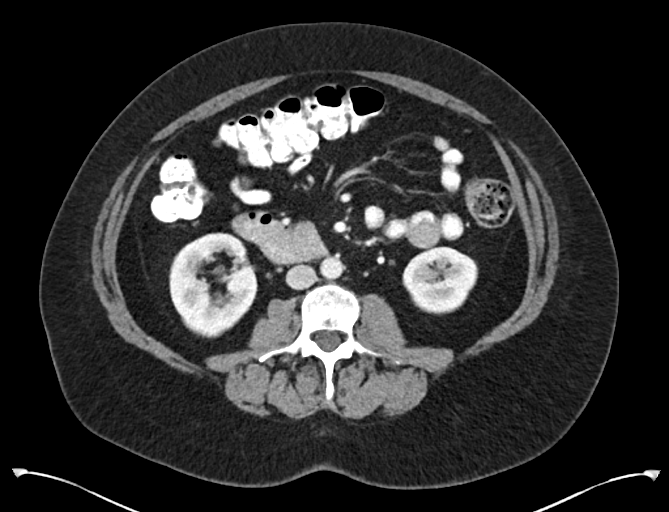
[im 70/95  soft-tissue]
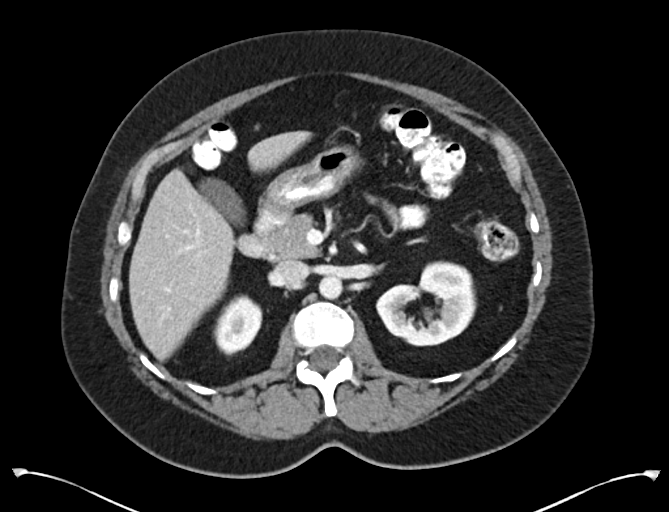
[im 80/95  soft-tissue]
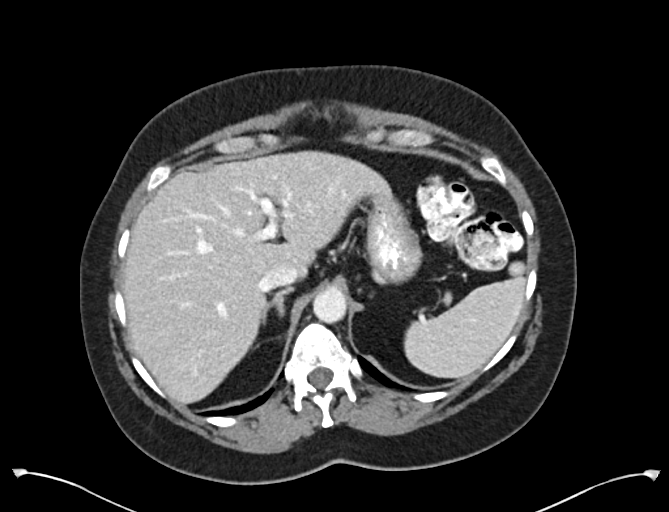
[im 90/95  soft-tissue]
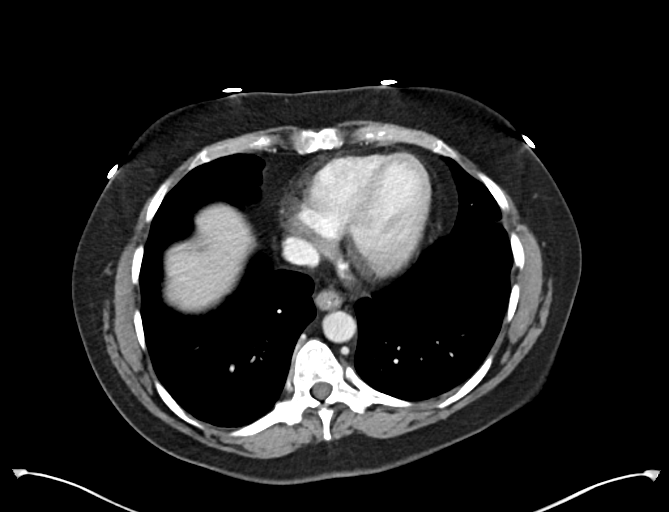
[im 90/95  bone]
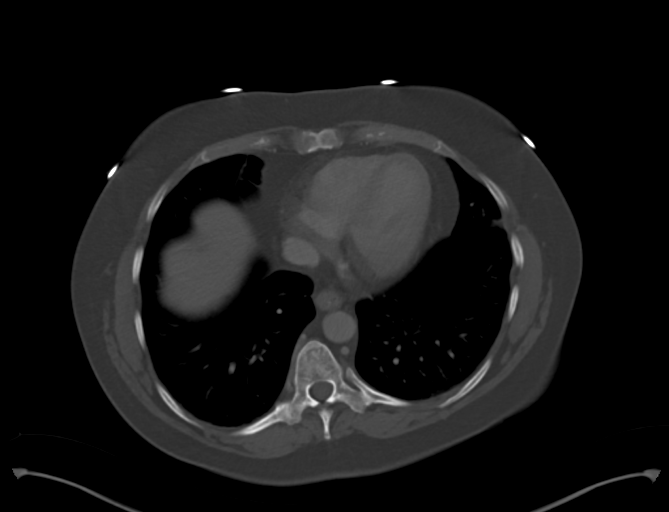

[Series 6: abd pelvis 2.00 br40 s3 cor · coronal · 0.87mm/px · 3 of 163 slices shown]
[im 55/163  soft-tissue]
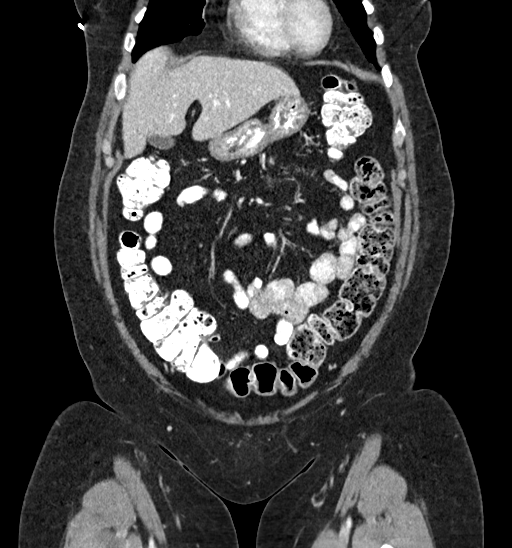
[im 73/163  soft-tissue]
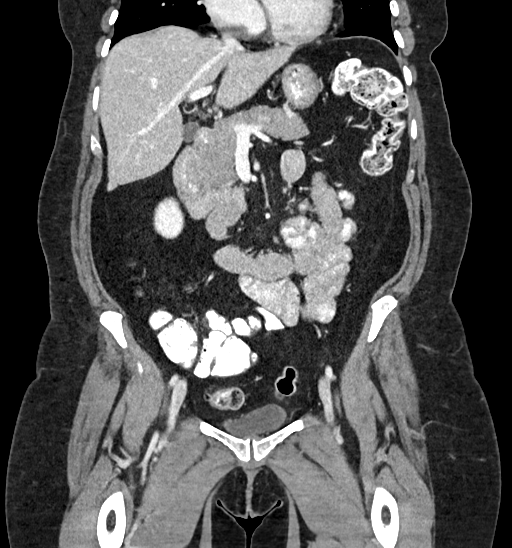
[im 91/163  soft-tissue]
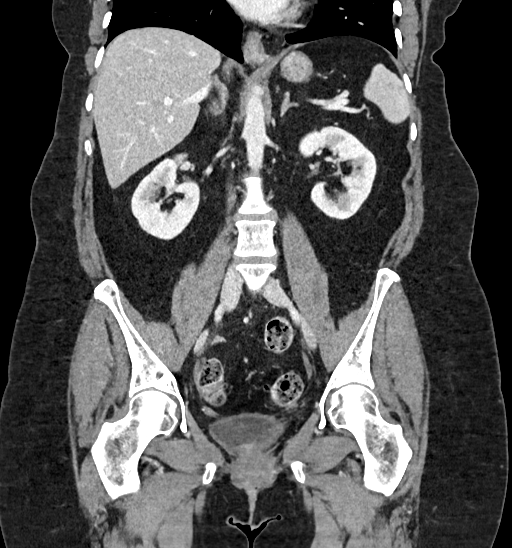

[12 of 46 positions shown; findings below may reference images not displayed]

FINDINGS: Lower chest: Limited visualization of the lower thorax demonstrates
minimal dependent subpleural ground-glass atelectasis. Minimal
atelectasis seen within the left costophrenic angle. No discrete
focal airspace opacities. No pleural effusion.

Normal heart size.  No pericardial effusion.

Hepatobiliary: Normal hepatic contour. No discrete hepatic lesions.
Normal appearance of the gallbladder given degree distention. No
radiopaque gallstones. No intra or extrahepatic biliary ductal
dilatation. No ascites.

Pancreas: Normal appearance of the pancreas.

Spleen: Normal appearance of the spleen. Note is made of a small
splenule about the tip of the spleen.

Adrenals/Urinary Tract: There is symmetric enhancement and excretion
of the bilateral kidneys. No definite renal stones on this
postcontrast examination. No discrete renal lesions. No urinary
obstruction or perinephric stranding.

Normal appearance the bilateral adrenal glands.

Normal appearance of the urinary bladder given underdistention.

Stomach/Bowel: Ingested enteric contrast extends to the level of the
descending colon. Large colonic stool burden without evidence of
enteric obstruction. Apparent circumferential bowel wall thickening
involving solitary short-segment loop of small bowel within midline
of the abdomen (representative image 42, series 2), is favored to
represent phase of peristalsis. Otherwise, no discrete areas of
bowel wall thickening. Scattered minimal colonic diverticulosis
without evidence of superimposed acute diverticulitis. No hiatal
hernia. No pneumoperitoneum, pneumatosis or portal venous gas.

Vascular/Lymphatic: Normal caliber the abdominal aorta. The major
branch vessels of the abdominal aorta appear patent on this non CTA
examination.

No bulky retroperitoneal, mesenteric, pelvic or inguinal
lymphadenopathy.

Reproductive: Post hysterectomy. No discrete adnexal lesion. No free
fluid the pelvic cul-de-sac.

Other: Small mesenteric fat containing periumbilical hernia. There
is minimal subcutaneous edema about the midline low back.

Musculoskeletal: No acute or aggressive osseous abnormalities. Note
is made of a bone island within the left ilium.
IMPRESSION: 1. Large colonic stool burden without evidence of enteric
obstruction.
2. Scattered colonic diverticulosis without evidence of superimposed
acute diverticulitis.
3. Otherwise, unremarkable CT scan of the abdomen and pelvis.

## 2021-01-07 NOTE — Progress Notes (Deleted)
[place] Phone: [phone] Subjective:    I'm seeing this patient by the request  of:  Plotnikov, [name], MD  CC:   [id number]  ANGELE WIEMANN is a 55 y.o. female coming in with complaint of back and neck pain. OMT on 11/28/2020. Patient states   Medications patient has been prescribed: None  Taking:         Reviewed prior external information including notes and imaging from previsou exam, outside providers and external EMR if available.   As well as notes that were available from care everywhere and other healthcare systems.  Past medical history, social, surgical and family history all reviewed in electronic medical record.  No pertanent information unless stated regarding to the chief complaint.   Past Medical History:  Diagnosis Date   Allergic urticaria    ARTHRALGIA    Arthritis    Atrial fibrillation (HCC)    B12 DEFICIENCY    CFS (chronic fatigue syndrome)    Endometriosis    Epidural hemorrhage with loss of consciousness ([place]) 06/29/15   Fibroid    Fibromyalgia    Fracture of cervical vertebra, C6 (HCC) 06/29/15   Gastritis    GERD    Heart murmur    History of endometriosis    IBS (irritable bowel syndrome)    Internal hemorrhoids    MVP (mitral valve prolapse)    Osteoarthritis    Rosacea    Skin cancer of arm, right    Thoracic compression fracture (HCC) 06/29/15   T2-T6    Allergies  Allergen Reactions   Indocin [Indomethacin] Swelling, Rash and Other (See Comments)    REACTION: lupus like illness; butterfly rash, pain and swelling all over body.    Morphine Sulfate Nausea And Vomiting   Sulfacetamide Sodium Swelling   Caffeine Other (See Comments)    Induces A-fib   Morphine And Related     nausea   Sulfa Antibiotics Swelling   Erythromycin Stearate Hives and Swelling    CAN TAKE Z-PAK     Review of Systems:  No headache, visual changes, nausea, vomiting,  diarrhea, constipation, dizziness, abdominal pain, skin rash, fevers, chills, night sweats, weight loss, swollen lymph nodes, body aches, joint swelling, chest pain, shortness of breath, mood changes. POSITIVE muscle aches  Objective  There were no vitals taken for this visit.   General: No apparent distress alert and oriented x3 mood and affect normal, dressed appropriately.  HEENT: Pupils equal, extraocular movements intact  Respiratory: Patient's speak in full sentences and does not appear short of breath  Cardiovascular: No lower extremity edema, non tender, no erythema  Neuro: Cranial nerves II through XII are intact, neurovascularly intact in all extremities with 2+ DTRs and 2+ pulses.  Gait normal with good balance and coordination.  MSK:  Non tender with full range of motion and good stability and symmetric strength and tone of shoulders, elbows, wrist, hip, knee and ankles bilaterally.  Back - Normal skin, Spine with normal alignment and no deformity.  No tenderness to vertebral process palpation.  Paraspinous muscles are not tender and without spasm.   Range of motion is full at neck and lumbar sacral regions  Osteopathic findings  C2 flexed rotated and side bent right C6 flexed rotated and side bent left T3 extended rotated and side bent right inhaled rib T9 extended rotated and side bent left L2 flexed rotated and side bent right Sacrum right on  right       Assessment and Plan:    Nonallopathic problems  Decision today to treat with OMT was based on Physical Exam  After verbal consent patient was treated with HVLA, ME, FPR techniques in cervical, rib, thoracic, lumbar, and sacral  areas  Patient tolerated the procedure well with improvement in symptoms  Patient given exercises, stretches and lifestyle modifications  See medications in patient instructions if given  Patient will follow up in 4-8 weeks      The above documentation has been reviewed and is  accurate and complete Belva Agee       Note: This dictation was prepared with Diplomatic Services operational officer dictation along with smaller Company secretary. Any transcriptional errors that result from this process are unintentional.

## 2021-01-08 ENCOUNTER — Ambulatory Visit: Payer: BC Managed Care – PPO | Admitting: Family Medicine

## 2021-01-31 ENCOUNTER — Telehealth (INDEPENDENT_AMBULATORY_CARE_PROVIDER_SITE_OTHER): Payer: BC Managed Care – PPO | Admitting: Nurse Practitioner

## 2021-01-31 ENCOUNTER — Other Ambulatory Visit: Payer: Self-pay

## 2021-01-31 DIAGNOSIS — J069 Acute upper respiratory infection, unspecified: Secondary | ICD-10-CM

## 2021-01-31 NOTE — Progress Notes (Signed)
Patient ID: Regina [name], female    DOB: 1965/10/14, 55 y.o.   MRN: [id number]  Virtual visit completed through [place], a video enabled telemedicine application. Due to national recommendations of social distancing due to COVID-19, a virtual visit is felt to be most appropriate for this patient at this time. Reviewed limitations, risks, security and privacy concerns of performing a virtual visit and the availability of in person appointments. I also reviewed that there may be a patient responsible charge related to this service. The patient agreed to proceed.   Unable to connect via Video enabled device. Revert to telephone visit.  Patient location: Work Provider location: Financial controller at [employer], office Persons participating in this virtual visit: patient, provider   If any vitals were documented, they were collected by patient at home unless specified below.    There were no vitals taken for this visit.   CC: Cough Subjective:   HPI: Regina [name] is a 55 y.o. female presenting on 01/31/2021 for Cough (X 4 days. Chest congestion, post nasal drip, ear fullness. No fever, no chills, no body aches, no sore throat. Had covid a month ago. Has not tested herself for Covid this time.)  Symptoms 01/28/2021 No covid test, States she recently had covid Not vaccinated against covid Has tried mucinex DM last night      Relevant past medical, surgical, family and social history reviewed and updated as indicated. Interim medical history since our last visit reviewed. Allergies and medications reviewed and updated. Outpatient Medications Prior to Visit  Medication Sig Dispense Refill   albuterol (PROAIR HFA) 108 (90 Base) MCG/ACT inhaler Inhale 2 puffs into the lungs every 6 (six) hours as needed for wheezing or shortness of breath. 1 each 3   Ascorbic Acid (VITAMIN C PO) Take by mouth.     Cholecalciferol (VITAMIN D3) 125 MCG (5000 UT) TABS Take 10,000 Units by mouth daily.      cyanocobalamin (,VITAMIN B-12,) 1000 MCG/ML injection Inject into the muscle.     loratadine (CLARITIN) 10 MG tablet Take 1 tablet (10 mg total) by mouth daily. 90 tablet 5   Magnesium 400 MG TABS Take 1 tablet by mouth daily.     Multiple Vitamins-Minerals (ZINC PO) Take by mouth.     Omega-3 Fatty Acids (FISH OIL PO) Take by mouth.     Probiotic Product (PROBIOTIC PO) Take by mouth.     SYRINGE-NEEDLE, DISP, 3 ML (BD ECLIPSE SYRINGE) 25G X 1" 3 ML MISC For SQ Vit B12 shots 50 each 3   Turmeric 500 MG TABS Take by mouth 2 (two) times daily.     UNABLE TO FIND Immunity booster     VITAMIN K PO Take 1 tablet by mouth every other day.     ZINC GLUCONATE PO Take 1 tablet by mouth daily.     amoxicillin-clavulanate (AUGMENTIN) 875-125 MG tablet Take 1 tablet by mouth 2 (two) times daily. 10 tablet 0   triamcinolone (KENALOG) 0.1 % paste Use qid prn on oral lesions (Patient not taking: Reported on 12/27/2020) 5 g 12   valACYclovir (VALTREX) 1000 MG tablet Take 1 tablet (1,000 mg total) by mouth 3 (three) times daily. (Patient not taking: Reported on 12/27/2020) 21 tablet 0   No facility-administered medications prior to visit.     Per HPI unless specifically indicated in ROS section below Review of Systems  Constitutional:  Negative for chills and fever.  HENT:  Positive for ear pain (fullness), postnasal  drip and sinus pressure. Negative for congestion, rhinorrhea and sore throat.   Respiratory:  Positive for cough and shortness of breath (DOE?).   Cardiovascular:  Negative for chest pain.  Gastrointestinal:  Negative for abdominal pain, diarrhea, nausea and vomiting.  Musculoskeletal:  Negative for arthralgias and myalgias.  Neurological:  Negative for headaches.  Objective:  There were no vitals taken for this visit.  Wt Readings from Last 3 Encounters:  12/27/20 172 lb (78 kg)  11/28/20 173 lb (78.5 kg)  10/24/20 174 lb (78.9 kg)       Physical exam: Gen: alert, NAD, not ill  appearing Pulm: speaks in complete sentences without increased work of breathing Psych: normal mood, normal thought content      Results for orders placed or performed in visit on 09/03/20  CBC with Differential/Platelet  Result Value Ref Range   WBC 5.3 4.0 - 10.5 K/uL   RBC 4.31 3.87 - 5.11 Mil/uL   Hemoglobin 12.8 12.0 - 15.0 g/dL   HCT 37.5 36.0 - 46.0 %   MCV 87.0 78.0 - 100.0 fl   MCHC 34.0 30.0 - 36.0 g/dL   RDW 13.1 11.5 - 15.5 %   Platelets 326.0 150.0 - 400.0 K/uL   Neutrophils Relative % 60.5 43.0 - 77.0 %   Lymphocytes Relative 32.7 12.0 - 46.0 %   Monocytes Relative 6.6 3.0 - 12.0 %   Eosinophils Relative 0.1 0.0 - 5.0 %   Basophils Relative 0.1 0.0 - 3.0 %   Neutro Abs 3.2 1.4 - 7.7 K/uL   Lymphs Abs 1.7 0.7 - 4.0 K/uL   Monocytes Absolute 0.3 0.1 - 1.0 K/uL   Eosinophils Absolute 0.0 0.0 - 0.7 K/uL   Basophils Absolute 0.0 0.0 - 0.1 K/uL  T4, free  Result Value Ref Range   Free T4 0.96 0.60 - 1.60 ng/dL  T3, free  Result Value Ref Range   T3, Free 3.5 2.3 - 4.2 pg/mL  Comprehensive metabolic panel  Result Value Ref Range   Sodium 141 135 - 145 mEq/L   Potassium 3.7 3.5 - 5.1 mEq/L   Chloride 103 96 - 112 mEq/L   CO2 27 19 - 32 mEq/L   Glucose, Bld 80 70 - 99 mg/dL   BUN 15 6 - 23 mg/dL   Creatinine, Ser 0.72 0.40 - 1.20 mg/dL   Total Bilirubin 0.6 0.2 - 1.2 mg/dL   Alkaline Phosphatase 64 39 - 117 U/L   AST 16 0 - 37 U/L   ALT 14 0 - 35 U/L   Total Protein 7.2 6.0 - 8.3 g/dL   Albumin 4.4 3.5 - 5.2 g/dL   GFR 94.16 >60.00 mL/min   Calcium 9.5 8.4 - 10.5 mg/dL  TSH  Result Value Ref Range   TSH 1.08 0.35 - 4.50 uIU/mL  Sedimentation rate  Result Value Ref Range   Sed Rate 18 0 - 30 mm/hr  C-reactive protein  Result Value Ref Range   CRP <1.0 0.5 - 20.0 mg/dL   Assessment & Plan:   Problem List Items Addressed This Visit       Respiratory   Upper respiratory tract infection - Primary    Patient with symptoms of what seems to be an upper  respiratory infection.  Today is day 4 of symptoms.  Patient states she recently had COVID and has not tested for COVID likelihood of reinfection 9 days alone.  Does not sound like flu.  We will continue supportive treatment of over-the-counter  regimens and medications patient will reach back out to [place] beginning of the week if her symptoms or not improving.  States she already has some Augmentin at home but she has not started treatment for a different reason and not used.  We will consider Augmentin or Z-Pak.  Continue to monitor        No orders of the defined types were placed in this encounter.  No orders of the defined types were placed in this encounter.  Phone call was 12 minutes in length  I discussed the assessment and treatment plan with the patient. The patient was provided an opportunity to ask questions and all were answered. The patient agreed with the plan and demonstrated an understanding of the instructions. The patient was advised to call back or seek an in-person evaluation if the symptoms worsen or if the condition fails to improve as anticipated.  Follow up plan: No follow-ups on file.  Romilda Garret, NP

## 2021-01-31 NOTE — Assessment & Plan Note (Signed)
Patient with symptoms of what seems to be an upper respiratory infection.  Today is day 4 of symptoms.  Patient states she recently had COVID and has not tested for COVID likelihood of reinfection 9 days alone.  Does not sound like flu.  We will continue supportive treatment of over-the-counter regimens and medications patient will reach back out to [place] beginning of the week if her symptoms or not improving.  States she already has some Augmentin at home but she has not started treatment for a different reason and not used.  We will consider Augmentin or Z-Pak.  Continue to monitor

## 2021-03-07 ENCOUNTER — Encounter: Payer: Self-pay | Admitting: Nurse Practitioner

## 2021-03-07 ENCOUNTER — Ambulatory Visit (INDEPENDENT_AMBULATORY_CARE_PROVIDER_SITE_OTHER): Payer: BC Managed Care – PPO | Admitting: Nurse Practitioner

## 2021-03-07 ENCOUNTER — Other Ambulatory Visit: Payer: Self-pay

## 2021-03-07 VITALS — BP 102/62 | HR 72 | Temp 96.8°F | Wt 174.0 lb

## 2021-03-07 DIAGNOSIS — K420 Umbilical hernia with obstruction, without gangrene: Secondary | ICD-10-CM | POA: Diagnosis not present

## 2021-03-07 DIAGNOSIS — K582 Mixed irritable bowel syndrome: Secondary | ICD-10-CM | POA: Diagnosis not present

## 2021-03-07 NOTE — Progress Notes (Signed)
Subjective:  Patient ID: Regina [name], female    DOB: Nov 21, 1965  Age: 55 y.o. MRN: [id number]  CC: Acute Visit (Pt c/o stomach issues x3 months or more. Pt states she feels like er bowels are impacted. Pt states she has soreness and pain in her abdomen and states it has moved sides from the right to the left. Pt has tried some natural approaches to help with the issue and states there has been no improvement. )  IBS (irritable bowel syndrome) Chronic, waxing and waning, worse with diary and gluten rich diet. Denies any new symptoms.  Advised to maintain gluten and diary free diet  Reviewed past Medical, Social and Family history today.  Outpatient Medications Prior to Visit  Medication Sig Dispense Refill   albuterol (PROAIR HFA) 108 (90 Base) MCG/ACT inhaler Inhale 2 puffs into the lungs every 6 (six) hours as needed for wheezing or shortness of breath. 1 each 3   Ascorbic Acid (VITAMIN C PO) Take by mouth.     Cholecalciferol (VITAMIN D3) 125 MCG (5000 UT) TABS Take 10,000 Units by mouth daily.     cyanocobalamin (,VITAMIN B-12,) 1000 MCG/ML injection Inject into the muscle.     loratadine (CLARITIN) 10 MG tablet Take 1 tablet (10 mg total) by mouth daily. 90 tablet 5   Magnesium 400 MG TABS Take 1 tablet by mouth daily.     Multiple Vitamins-Minerals (ZINC PO) Take by mouth.     Omega-3 Fatty Acids (FISH OIL PO) Take by mouth.     Probiotic Product (PROBIOTIC PO) Take by mouth.     SYRINGE-NEEDLE, DISP, 3 ML (BD ECLIPSE SYRINGE) 25G X 1" 3 ML MISC For SQ Vit B12 shots 50 each 3   Turmeric 500 MG TABS Take by mouth 2 (two) times daily.     UNABLE TO FIND Immunity booster     VITAMIN K PO Take 1 tablet by mouth every other day.     ZINC GLUCONATE PO Take 1 tablet by mouth daily.     No facility-administered medications prior to visit.    ROS See HPI  Objective:  BP 102/62 (BP Location: Left Arm, Patient Position: Sitting, Cuff Size: Normal)   Pulse 72   Temp (!) 96.8 F  (36 C) (Temporal)   Wt 174 lb (78.9 kg)   BMI 29.87 kg/m   Physical Exam Cardiovascular:     Rate and Rhythm: Normal rate.     Pulses: Normal pulses.  Pulmonary:     Effort: Pulmonary effort is normal.  Abdominal:     General: Bowel sounds are normal. There is distension.     Palpations: Abdomen is soft.     Tenderness: There is no abdominal tenderness. There is no guarding.     Hernia: A hernia is present. Hernia is present in the umbilical area.  Musculoskeletal:     Right lower leg: No edema.     Left lower leg: No edema.  Skin:    General: Skin is warm and dry.  Neurological:     Mental Status: She is alert.   Assessment & Plan:  This visit occurred during the SARS-CoV-2 public health emergency.  Safety protocols were in place, including screening questions prior to the visit, additional usage of staff PPE, and extensive cleaning of exam room while observing appropriate contact time as indicated for disinfecting solutions.   Regina [name] was seen today for acute visit.  Diagnoses and all orders for this visit:  Irritable bowel syndrome  with both constipation and diarrhea  Umbilical hernia with obstruction, without gangrene -     US Abdomen Limited; Future  Problem List Items Addressed This Visit       Digestive   IBS (irritable bowel syndrome) - Primary    Chronic, waxing and waning, worse with diary and gluten rich diet. Denies any new symptoms.  Advised to maintain gluten and diary free diet        Other   Umbilical hernia with obstruction, without gangrene   Relevant Orders   US Abdomen Limited    Follow-up: No follow-ups on file.  Wilfred Lacy, NP

## 2021-03-07 NOTE — Patient Instructions (Signed)
Resume gluten free diet Maintain high dietary fiber and adequate oral hydration. Let me know if you want dicyclomine prescription sent for Abdominal cramps.  You will be contacted to schedule appt for [place]

## 2021-03-08 ENCOUNTER — Encounter: Payer: Self-pay | Admitting: Nurse Practitioner

## 2021-03-08 DIAGNOSIS — K420 Umbilical hernia with obstruction, without gangrene: Secondary | ICD-10-CM | POA: Insufficient documentation

## 2021-03-08 NOTE — Assessment & Plan Note (Signed)
Chronic, waxing and waning, worse with diary and gluten rich diet. Denies any new symptoms.  Advised to maintain gluten and diary free diet

## 2021-03-10 ENCOUNTER — Ambulatory Visit: Payer: BC Managed Care – PPO | Admitting: Internal Medicine

## 2021-03-26 NOTE — Progress Notes (Signed)
[facility] [place] Phone: [phone] Subjective:   [name], am serving as a scribe for Dr. Hulan Saas. This visit occurred during the SARS-CoV-2 public health emergency.  Safety protocols were in place, including screening questions prior to the visit, additional usage of staff PPE, and extensive cleaning of exam room while observing appropriate contact time as indicated for disinfecting solutions.  I'm seeing this patient by the request  of:  Plotnikov, [name], MD  CC: back and neck pain   [id number]  Regina [name] is a 55 y.o. female coming in with complaint of back and neck pain. OMT 11/28/2020. Patient states that she has been L sided lower back pain.  Notes tingling in glutes at end of the day. Also notes pain in LLQ of abdomen with a bulge. States that she has hx IBS. Pain was worse last night and feels like she might have had a fever and went to bed at 7pm.   Medications patient has been prescribed: None  Taking:         Reviewed prior external information including notes and imaging from previsou exam, outside providers and external EMR if available.   As well as notes that were available from care everywhere and other healthcare systems.  Past medical history, social, surgical and family history all reviewed in electronic medical record.  No pertanent information unless stated regarding to the chief complaint.   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    Morphine Sulfate Nausea And Vomiting   Sulfacetamide Sodium Swelling   Caffeine Other (See Comments)    Induces A-fib   Morphine And Related     nausea   Sulfa Antibiotics Swelling   Erythromycin Stearate Hives and Swelling    CAN TAKE Z-PAK     Review of Systems:  No headache, visual changes, nausea, vomiting, diarrhea, constipation, dizziness, skin rash, fevers, chills, night sweats, weight loss, swollen lymph nodes, , joint swelling, chest pain, shortness of breath, mood changes. POSITIVE muscle aches, body aches and abdominal pain  Objective  Blood pressure 102/66, pulse 72, height 5\' 4"  (1.626 m), weight 169 lb (76.7 kg), SpO2 99 %.   General: No apparent distress alert and oriented x3 mood and affect normal, dressed appropriately.  HEENT: Pupils equal, extraocular movements intact  Respiratory: Patient's speak in full sentences and does not appear short of breath  Cardiovascular: No lower extremity edema, non tender, no erythema  Patient has significant distention noted of the abdomen.  Patient does have diffuse tenderness  noted but no significant rebound.  Likely hernia noted at the umbilical area but does seem to be reproducible.  Patient back significant tightness noted in the paraspinal musculature throughout the entire lumbar spine and sacroiliac joints.       Assessment and Plan:  Low back pain Patient usually does have low back pain.  Usually we would consider the possibility of manipulation but evaluate the distention of the stomach patient is unable to tolerate it.  Encourage patient to have the ultrasound that she is scheduled for.  Discussed the potential for muscle  relaxer but patient was concerned about potential allergy patient is allergic to indomethacin but not a muscle relaxer.  Patient knows if worsening pain to seek medical attention.  Patient will follow up with me again in 3 to 4 weeks  IBS (irritable bowel syndrome) Patient has not been doing well with the diet.  Do think that this is contributing.  In addition to this patient has significant more stress recently.  Likely also contributing.  Possible umbilical hernia noted on exam today and patient is already scheduled for an ultrasound in the near future.  Patient knows worsening pain to seek medical attention.       The above documentation has been reviewed and is accurate and complete Lyndal Pulley, DO        Note: This dictation was prepared with Dragon dictation along with smaller phrase technology. Any transcriptional errors that result from this process are unintentional.

## 2021-03-27 ENCOUNTER — Other Ambulatory Visit: Payer: Self-pay

## 2021-03-27 ENCOUNTER — Ambulatory Visit: Payer: BC Managed Care – PPO | Admitting: Family Medicine

## 2021-03-27 ENCOUNTER — Encounter: Payer: Self-pay | Admitting: Family Medicine

## 2021-03-27 ENCOUNTER — Ambulatory Visit
Admission: RE | Admit: 2021-03-27 | Discharge: 2021-03-27 | Disposition: A | Discharge status: Home / Self Care | Payer: BC Managed Care – PPO | Source: Ambulatory Visit | Attending: Nurse Practitioner | Admitting: Nurse Practitioner

## 2021-03-27 DIAGNOSIS — K582 Mixed irritable bowel syndrome: Secondary | ICD-10-CM

## 2021-03-27 DIAGNOSIS — K420 Umbilical hernia with obstruction, without gangrene: Secondary | ICD-10-CM

## 2021-03-27 DIAGNOSIS — M5441 Lumbago with sciatica, right side: Secondary | ICD-10-CM | POA: Diagnosis not present

## 2021-03-27 DIAGNOSIS — G8929 Other chronic pain: Secondary | ICD-10-CM

## 2021-03-27 NOTE — Assessment & Plan Note (Signed)
Patient has not been doing well with the diet.  Do think that this is contributing.  In addition to this patient has significant more stress recently.  Likely also contributing.  Possible umbilical hernia noted on exam today and patient is already scheduled for an ultrasound in the near future.  Patient knows worsening pain to seek medical attention.

## 2021-03-27 NOTE — Assessment & Plan Note (Signed)
Patient usually does have low back pain.  Usually we would consider the possibility of manipulation but evaluate the distention of the stomach patient is unable to tolerate it.  Encourage patient to have the ultrasound that she is scheduled for.  Discussed the potential for muscle relaxer but patient was concerned about potential allergy patient is allergic to indomethacin but not a muscle relaxer.  Patient knows if worsening pain to seek medical attention.  Patient will follow up with me again in 3 to 4 weeks

## 2021-03-27 NOTE — Patient Instructions (Signed)
Do your bowel regimen Mirilax 17g daily, Colace 100mg  daily for 10 days Get [place]  Try to find time for yourself Will look at results when I can See me again in 3-4 weeks to hopefully restart OMT

## 2021-04-03 ENCOUNTER — Ambulatory Visit: Payer: BC Managed Care – PPO | Admitting: Family Medicine

## 2021-04-03 ENCOUNTER — Telehealth: Payer: Self-pay

## 2021-04-03 NOTE — Telephone Encounter (Signed)
ERROR

## 2021-04-17 ENCOUNTER — Ambulatory Visit: Payer: BC Managed Care – PPO | Admitting: Family Medicine

## 2021-04-22 ENCOUNTER — Encounter: Payer: Self-pay | Admitting: Family Medicine

## 2021-04-22 ENCOUNTER — Other Ambulatory Visit: Payer: Self-pay

## 2021-04-22 ENCOUNTER — Ambulatory Visit (INDEPENDENT_AMBULATORY_CARE_PROVIDER_SITE_OTHER): Payer: BC Managed Care – PPO | Admitting: Family Medicine

## 2021-04-22 VITALS — BP 104/70 | HR 52 | Ht 64.0 in | Wt 168.0 lb

## 2021-04-22 DIAGNOSIS — M797 Fibromyalgia: Secondary | ICD-10-CM | POA: Diagnosis not present

## 2021-04-22 DIAGNOSIS — M9904 Segmental and somatic dysfunction of sacral region: Secondary | ICD-10-CM | POA: Diagnosis not present

## 2021-04-22 DIAGNOSIS — M9903 Segmental and somatic dysfunction of lumbar region: Secondary | ICD-10-CM | POA: Diagnosis not present

## 2021-04-22 DIAGNOSIS — M9902 Segmental and somatic dysfunction of thoracic region: Secondary | ICD-10-CM | POA: Diagnosis not present

## 2021-04-22 DIAGNOSIS — M9908 Segmental and somatic dysfunction of rib cage: Secondary | ICD-10-CM | POA: Diagnosis not present

## 2021-04-22 NOTE — Assessment & Plan Note (Signed)
Chronic, with exacerbation.  Patient wanted to try osteopathic manipulation and did have significant amount of improvement afterwards.  Discussed with patient about icing regimen.  Patient wants to continue to avoid certain number of medications if possible.  Patient has not been able to be taking care of herself and has had some underlying stress that is likely also contributing.  We discussed icing regimen and home exercises.  Increase activity slowly.  Follow-up again in 6 to 8 weeks

## 2021-04-22 NOTE — Patient Instructions (Addendum)
Good to see you! 2 tennis balls and tube sock lay them where the head meets the neck Possibly chewing gum can help clinching See you again in 4-6 weeks

## 2021-04-22 NOTE — Progress Notes (Signed)
[place] Phone: [phone] Subjective:  Regina [name], am serving as a scribe for Dr. Hulan Saas.  This visit occurred during the SARS-CoV-2 public health emergency.  Safety protocols were in place, including screening questions prior to the visit, additional usage of staff PPE, and extensive cleaning of exam room while observing appropriate contact time as indicated for disinfecting solutions I'm seeing this patient by the request  of:  Plotnikov, [name], MD  CC: Back pain follow-up  [id number]  03/27/2021 Patient usually does have low back pain.  Usually we would consider the possibility of manipulation but evaluate the distention of the stomach patient is unable to tolerate it.  Encourage patient to have the ultrasound that she is scheduled for.  Discussed the potential for muscle relaxer but patient was concerned about potential allergy patient is allergic to indomethacin but not a muscle relaxer.  Patient knows if worsening pain to seek medical attention.  Patient will follow up with me again in 3 to 4 weeks   IBS (irritable bowel syndrome) Patient has not been doing well with the diet.  Do think that this is contributing.  In addition to this patient has significant more stress recently.  Likely also contributing.  Possible umbilical hernia noted on exam today and patient is already scheduled for an ultrasound in the near future.  Patient knows worsening pain to seek medical attention.    Update 04/22/2021 Regina [name] is a 56 y.o. female coming in with complaint of R-sided LBP. Patient states that her lower back is doing well. Neck pain for past 3 days that radiates into the front L side of c-spine. Had massage which helped lower back.     Past Medical History:  Diagnosis Date   Allergic urticaria    ARTHRALGIA    Arthritis    Atrial fibrillation (HCC)    B12 DEFICIENCY    CFS (chronic  fatigue syndrome)    Endometriosis    Epidural hemorrhage with loss of consciousness ([place]) 06/29/15   Fibroid    Fibromyalgia    Fracture of cervical vertebra, C6 (HCC) 06/29/15   Gastritis    GERD    Heart murmur    History of endometriosis    IBS (irritable bowel syndrome)    Internal hemorrhoids    MVP (mitral valve prolapse)    Osteoarthritis    Rosacea    Skin cancer of arm, right    Thoracic compression fracture ([place]) 06/29/15   T2-T6   Past Surgical History:  Procedure Laterality Date   ABDOMINAL HYSTERECTOMY     partial   ABDOMINAL HYSTERECTOMY     ANAL FISSURE REPAIR     Dr Rise [name] 2009   APPENDECTOMY     COLPOSCOPY     HEMORRHOID SURGERY     Dr. Rise [name] 2009   OVARIAN CYST REMOVAL     RT BUNIONECTOMT     TONSILLECTOMY AND ADENOIDECTOMY     TUBAL LIGATION     UTERINE FIBROID SURGERY     Social History   Socioeconomic History   Marital status: Married    Spouse name: Not on file   Number of children: Not on file   Years of education: Not on file   Highest education level: Not on file  Occupational History   Not on file  Tobacco Use   Smoking status: Former    Types: Cigarettes    Quit date: 10/31/1988  Years since quitting: 32.4   Smokeless tobacco: Never  Vaping Use   Vaping Use: Never used  Substance and Sexual Activity   Alcohol use: Yes    Alcohol/week: 1.0 - 2.0 standard drink    Types: 1 - 2 Standard drinks or equivalent per week    Comment: occasional    Drug use: [name]   Sexual activity: Yes    Partners: Male    Birth control/protection: Surgical    Comment: hysterectomy  Other Topics Concern   Not on file  Social History Narrative   ** Merged History Encounter **       Working 2 jobs, 6d/wk      Regular exercise-[name]      Divorced   Social Determinants of Radio broadcast assistant Strain: Not on file  Food Insecurity: Not on file  Transportation Needs: Not on file  Physical Activity: Not on file  Stress: Not on file  Social  Connections: Not on file   Allergies  Allergen Reactions   Indocin [Indomethacin] Swelling, Rash and Other (See Comments)    REACTION: lupus like illness; butterfly rash, pain and swelling all over body.    Morphine Sulfate Nausea And Vomiting   Sulfacetamide Sodium Swelling   Caffeine Other (See Comments)    Induces A-fib   Morphine And Related     nausea   Sulfa Antibiotics Swelling   Erythromycin Stearate Hives and Swelling    CAN TAKE Z-PAK   Family History  Problem Relation Age of Onset   Lung cancer Mother    Thyroid cancer Mother    Cancer Mother        INTESTINAL   Healthy Father    Diabetes Other        uncles/aunts   Von Willebrand disease Daughter    Breast cancer Paternal Aunt    Asthma Neg Hx       Current Outpatient Medications (Respiratory):    albuterol (PROAIR HFA) 108 (90 Base) MCG/ACT inhaler, Inhale 2 puffs into the lungs every 6 (six) hours as needed for wheezing or shortness of breath.   loratadine (CLARITIN) 10 MG tablet, Take 1 tablet (10 mg total) by mouth daily.   Current Outpatient Medications (Hematological):    cyanocobalamin (,VITAMIN B-12,) 1000 MCG/ML injection, Inject into the muscle.  Current Outpatient Medications (Other):    Ascorbic Acid (VITAMIN C PO), Take by mouth.   Cholecalciferol (VITAMIN D3) 125 MCG (5000 UT) TABS, Take 10,000 Units by mouth daily.   Magnesium 400 MG TABS, Take 1 tablet by mouth daily.   Multiple Vitamins-Minerals (ZINC PO), Take by mouth.   Omega-3 Fatty Acids (FISH OIL PO), Take by mouth.   Probiotic Product (PROBIOTIC PO), Take by mouth.   SYRINGE-NEEDLE, DISP, 3 ML (BD ECLIPSE SYRINGE) 25G X 1" 3 ML MISC, For SQ Vit B12 shots   Turmeric 500 MG TABS, Take by mouth 2 (two) times daily.   UNABLE TO FIND, Immunity booster   VITAMIN K PO, Take 1 tablet by mouth every other day.   ZINC GLUCONATE PO, Take 1 tablet by mouth daily.   Reviewed prior external information including notes and imaging from   primary care provider As well as notes that were available from care everywhere and other healthcare systems.  Past medical history, social, surgical and family history all reviewed in electronic medical record.  [name] pertanent information unless stated regarding to the chief complaint.   Review of Systems:  [name] headache, visual changes, nausea,  vomiting, diarrhea, constipation, dizziness, abdominal pain, skin rash, fevers, chills, night sweats, weight loss, swollen lymph nodes,  joint swelling, chest pain, shortness of breath, mood changes. POSITIVE muscle aches, body aches  Objective  Blood pressure 104/70, pulse (!) 52, height 5\' 4"  (1.626 m), weight 168 lb (76.2 kg), SpO2 99 %.   General: [name] apparent distress alert and oriented x3 mood and affect normal, dressed appropriately.  HEENT: Pupils equal, extraocular movements intact  Respiratory: Patient's speak in full sentences and does not appear short of breath  Cardiovascular: [name] lower extremity edema, non tender, [name] erythema  Gait normal with good balance and coordination.  MSK: Patient does have some loss of lordosis.  Tightness noted in the parascapular region bilaterally.  Patient does have tightness noted also in the lumbar spine.  Osteopathic findings  T3 extended rotated and side bent right inhaled third rib T9 extended rotated and side bent left L2 flexed rotated and side bent right Sacrum right on right     Impression and Recommendations:     The above documentation has been reviewed and is accurate and complete Lyndal Pulley, DO    I, [name], am serving as a scribe for Dr. Hulan Saas.

## 2021-04-22 NOTE — Assessment & Plan Note (Signed)
° °  Decision today to treat with OMT was based on Physical Exam  After verbal consent patient was treated with , ME, FPR techniques in  thoracic, rib, lumbar and sacral areas, all areas are chronic   Patient tolerated the procedure well with improvement in symptoms  Patient given exercises, stretches and lifestyle modifications  See medications in patient instructions if given  Patient will follow up in 4-8 weeks

## 2021-04-30 ENCOUNTER — Ambulatory Visit: Payer: BC Managed Care – PPO | Admitting: Gastroenterology

## 2021-04-30 ENCOUNTER — Encounter: Payer: Self-pay | Admitting: Gastroenterology

## 2021-04-30 VITALS — BP 98/70 | HR 73 | Ht 64.0 in | Wt 170.0 lb

## 2021-04-30 DIAGNOSIS — K589 Irritable bowel syndrome without diarrhea: Secondary | ICD-10-CM | POA: Diagnosis not present

## 2021-04-30 DIAGNOSIS — R140 Abdominal distension (gaseous): Secondary | ICD-10-CM

## 2021-04-30 DIAGNOSIS — R109 Unspecified abdominal pain: Secondary | ICD-10-CM

## 2021-04-30 DIAGNOSIS — R12 Heartburn: Secondary | ICD-10-CM | POA: Diagnosis not present

## 2021-04-30 DIAGNOSIS — K219 Gastro-esophageal reflux disease without esophagitis: Secondary | ICD-10-CM | POA: Diagnosis not present

## 2021-04-30 MED ORDER — FAMOTIDINE 20 MG PO TABS: 20.0000 mg | ORAL_TABLET | Freq: Every day | ORAL | 3 refills | Status: DC

## 2021-04-30 NOTE — Patient Instructions (Addendum)
You have been scheduled for an endoscopy and colonoscopy. Please follow the written instructions given to you at your visit today. Please pick up your prep supplies at the pharmacy within the next 1-3 days. If you use inhalers (even only as needed), please bring them with you on the day of your procedure.   We have sent Pepcid to your pharmacy  Start IBGard 1 capsule three times a day as needed   You have been given a testing kit to check for small intestine bacterial overgrowth (SIBO) which is completed by a company named [facility]. Make sure to return your test in the mail using the return mailing label given to you along with the kit. Your demographic and insurance information have already been sent to the company and they should be in contact with you over the next week regarding this test. [facility] will collect an upfront charge of $99.74 for commercial insurance plans and $209.74 is you are paying cash. Make sure to discuss with [facility] PRIOR to having the test if they have gotten informatoin from your insurance company as to how much your testing will cost out of pocket, if any. Please keep in mind that you will be getting a call from phone number [phone] or a similar number. If you do not hear from them within this time frame, please call our office at [phone].    If you are age 56 or older, your body mass index should be between 23-30. Your Body mass index is 29.18 kg/m. If this is out of the aforementioned range listed, please consider follow up with your Primary Care Provider.  If you are age 23 or younger, your body mass index should be between 19-25. Your Body mass index is 29.18 kg/m. If this is out of the aformentioned range listed, please consider follow up with your Primary Care Provider.   ________________________________________________________  The [facility] providers would like to encourage you to use [facility] to communicate with providers for  non-urgent requests or questions.  Due to long hold times on the telephone, sending your provider a message by [facility] may be a faster and more efficient way to get a response.  Please allow 48 business hours for a response.  Please remember that this is for non-urgent requests.  _______________________________________________________   Due to recent changes in healthcare laws, you may see the results of your imaging and laboratory studies on MyChart before your provider has had a chance to review them.  We understand that in some cases there may be results that are confusing or concerning to you. Not all laboratory results come back in the same time frame and the provider may be waiting for multiple results in order to interpret others.  Please give [place] 48 hours in order for your provider to thoroughly review all the results before contacting the office for clarification of your results.    I appreciate the  opportunity to care for you  Thank You   Harl Bowie , MD

## 2021-04-30 NOTE — Progress Notes (Signed)
Regina [name]    [id number]    February 10, 1966  Primary Care Physician:Plotnikov, [name], MD  Referring Physician: Cassandria Anger, MD [place],  [place] [address]   Chief complaint:  IBS, heartburn, abdominal bloating  HPI: 56 year old very pleasant female here with complaints of worsening heartburn, abdominal bloating and irritable bowel syndrome On average she is having heartburn symptoms once a week, she tries to drink aloe or take Tums as needed.  Most nights has to take 2-3 times at bedtime for nocturnal reflux symptoms She has constant sensation of need to clear her throat and also has occasional postprandial fullness She feels her dietary habits are possibly contributing to her symptoms.Last night she ate at [employer], taboulli and salad, suffered with heartburn and acid reflux most of the night  Abdominal ultrasound 03/27/21 Unremarkable  She had an EGD in September of 2013 at which time she was found to have reflux esophagitis and gastritis.  Gastric biopsies showed chronic gastritis, negative for H. pylori. Duodenal biopsies were normal. GE junction biopsy showed changes consistent with reflux, but no Barrett's Outpatient Encounter Medications as of 04/30/2021  Medication Sig   Ascorbic Acid (VITAMIN C PO) Take by mouth.   Cholecalciferol (VITAMIN D3) 125 MCG (5000 UT) TABS Take 10,000 Units by mouth daily.   cyanocobalamin (,VITAMIN B-12,) 1000 MCG/ML injection Inject into the muscle.   loratadine (CLARITIN) 10 MG tablet Take 1 tablet (10 mg total) by mouth daily.   Magnesium 400 MG TABS Take 1 tablet by mouth daily.   Omega-3 Fatty Acids (FISH OIL PO) Take by mouth.   Probiotic Product (PROBIOTIC PO) Take by mouth.   SYRINGE-NEEDLE, DISP, 3 ML (BD ECLIPSE SYRINGE) 25G X 1" 3 ML MISC For SQ Vit B12 shots   Turmeric 500 MG TABS Take by mouth 2 (two) times daily.   UNABLE TO FIND Immunity booster   VITAMIN K PO Take 1 tablet by  mouth every other day.   ZINC GLUCONATE PO Take 1 tablet by mouth daily.   [DISCONTINUED] albuterol (PROAIR HFA) 108 (90 Base) MCG/ACT inhaler Inhale 2 puffs into the lungs every 6 (six) hours as needed for wheezing or shortness of breath.   [DISCONTINUED] Multiple Vitamins-Minerals (ZINC PO) Take by mouth.   No facility-administered encounter medications on file as of 04/30/2021.    Allergies as of 04/30/2021 - Review Complete 04/30/2021  Allergen Reaction Noted   Indocin [indomethacin] Swelling, Rash, and Other (See Comments) 07/26/2009   Morphine sulfate Nausea And Vomiting    Sulfacetamide sodium Swelling    Caffeine Other (See Comments) 07/24/2015   Morphine and related  06/29/2015   Sulfa antibiotics Swelling 06/29/2015   Erythromycin stearate Hives and Swelling     Past Medical History:  Diagnosis Date   Allergic urticaria    ARTHRALGIA    Arthritis    Atrial fibrillation (HCC)    B12 DEFICIENCY    CFS (chronic fatigue syndrome)    Endometriosis    Epidural hemorrhage with loss of consciousness ([place]) 06/29/15   Fibroid    Fibromyalgia    Fracture of cervical vertebra, C6 (HCC) 06/29/15   Gastritis    GERD    Heart murmur    History of endometriosis    IBS (irritable bowel syndrome)    Internal hemorrhoids    MVP (mitral valve prolapse)    Osteoarthritis    Rosacea    Skin cancer of  arm, right    Thoracic compression fracture ([place]) 06/29/15   T2-T6    Past Surgical History:  Procedure Laterality Date   ABDOMINAL HYSTERECTOMY     partial   ABDOMINAL HYSTERECTOMY     ANAL FISSURE REPAIR     Dr Rise [name] 2009   APPENDECTOMY     COLPOSCOPY     HEMORRHOID SURGERY     Dr. Rise [name] 2009   OVARIAN CYST REMOVAL     RT BUNIONECTOMT     TONSILLECTOMY AND ADENOIDECTOMY     TUBAL LIGATION     UTERINE FIBROID SURGERY      Family History  Problem Relation Age of Onset   Lung cancer Mother    Thyroid cancer Mother    Cancer Mother        INTESTINAL   Healthy  Father    Von Willebrand disease Daughter    Breast cancer Paternal Aunt    Diabetes Other        uncles/aunts   Asthma Neg Hx    Stomach cancer Neg Hx    Colon cancer Neg Hx    Esophageal cancer Neg Hx    Pancreatic cancer Neg Hx     Social History   Socioeconomic History   Marital status: Married    Spouse name: Not on file   Number of children: Not on file   Years of education: Not on file   Highest education level: Not on file  Occupational History   Not on file  Tobacco Use   Smoking status: Former    Types: Cigarettes    Quit date: 10/31/1988    Years since quitting: 32.5   Smokeless tobacco: Never  Vaping Use   Vaping Use: Never used  Substance and Sexual Activity   Alcohol use: Yes    Alcohol/week: 1.0 - 2.0 standard drink    Types: 1 - 2 Standard drinks or equivalent per week    Comment: occasional    Drug use: No   Sexual activity: Yes    Partners: Male    Birth control/protection: Surgical    Comment: hysterectomy  Other Topics Concern   Not on file  Social History Narrative   ** Merged History Encounter **       Working 2 jobs, 6d/wk      Regular exercise-no      Divorced   Social Determinants of Radio broadcast assistant Strain: Not on file  Food Insecurity: Not on file  Transportation Needs: Not on file  Physical Activity: Not on file  Stress: Not on file  Social Connections: Not on file  Intimate Partner Violence: Not on file      Review of systems: All other review of systems negative except as mentioned in the HPI.   Physical Exam: Vitals:   04/30/21 0856  BP: 98/70  Pulse: 73  SpO2: 97%   Body mass index is 29.18 kg/m. Gen:      No acute distress HEENT:  sclera anicteric Abd:      soft, non-tender; no palpable masses, no distension Ext:    No edema Neuro: alert and oriented x 3 Psych: normal mood and affect  Data Reviewed:  Reviewed labs, radiology imaging, old records and pertinent past GI work up   Assessment  and Plan/Recommendations: 56 year old female with complaints of worsening heartburn, irritable bowel syndrome and some abdominal bloating Scheduled for EGD for further evaluation, exclude erosive esophagitis, gastroduodenal ulcer or H. pylori related gastritis  GERD: Start  Pepcid daily at bedtime as needed Antireflux measures  Due for colorectal cancer screening, will schedule colonoscopy along with EGD  If above work-up is unremarkable, will plan to proceed with lactulose breath test to exclude small intestinal bacterial overgrowth.  Patient was provided test kit  Return in 2 to 3 months   The patient was provided an opportunity to ask questions and all were answered. The patient agreed with the plan and demonstrated an understanding of the instructions.  Damaris Hippo , MD    CC: Plotnikov, [name], MD

## 2021-05-07 ENCOUNTER — Other Ambulatory Visit: Payer: Self-pay

## 2021-05-07 ENCOUNTER — Ambulatory Visit (INDEPENDENT_AMBULATORY_CARE_PROVIDER_SITE_OTHER): Payer: BC Managed Care – PPO

## 2021-05-07 ENCOUNTER — Ambulatory Visit: Payer: BC Managed Care – PPO | Admitting: Podiatry

## 2021-05-07 DIAGNOSIS — L600 Ingrowing nail: Secondary | ICD-10-CM | POA: Diagnosis not present

## 2021-05-07 DIAGNOSIS — L989 Disorder of the skin and subcutaneous tissue, unspecified: Secondary | ICD-10-CM | POA: Diagnosis not present

## 2021-05-07 DIAGNOSIS — M7741 Metatarsalgia, right foot: Secondary | ICD-10-CM

## 2021-05-07 NOTE — Progress Notes (Signed)
HPI: 56 y.o. female presenting today for evaluation of symptomatic calluses to the bilateral feet.  She does have history of right foot surgery back in 2010.  Patient states that she has been developing symptomatic calluses and pain to the right forefoot.  She would like to have them evaluated  Also the patient states that she has had chronic ingrown toenails to the bilateral great toes for several years.  She routinely gets pedicures and they try to take them out.  They continue to be sensitive despite pedicures and shoe gear modifications.  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 Rise [name] 2009   OVARIAN CYST REMOVAL     RT BUNIONECTOMT     TONSILLECTOMY AND ADENOIDECTOMY     TUBAL LIGATION     UTERINE FIBROID SURGERY      Allergies  Allergen Reactions   Indocin [Indomethacin] Swelling, Rash and Other (See Comments)    REACTION: lupus like illness; butterfly rash, pain and swelling all over body.    Morphine Sulfate Nausea And Vomiting   Sulfacetamide Sodium Swelling   Caffeine Other (See Comments)    Induces A-fib   Morphine And Related     nausea    Sulfa Antibiotics Swelling   Erythromycin Stearate Hives and Swelling    CAN TAKE Z-PAK     Physical Exam: General: The patient is alert and oriented x3 in no acute distress.  Dermatology: Skin is warm, dry and supple bilateral lower extremities.  Hyperkeratotic calluses noted bilateral feet with associated tenderness to palpation.  Ingrowing toenails also noted to the medial border of the bilateral great toes with associated tenderness to palpation  Vascular: Palpable pedal pulses bilaterally. Capillary refill within normal limits.  Negative for any significant edema or erythema  Neurological: Light touch and protective threshold grossly intact  Musculoskeletal Exam: Pain on palpation along the second and third metatarsal heads consistent with radiographic findings and chronic metatarsalgia.  There is also pain on palpation to the calluses noted to the bilateral feet  Radiographic Exam:  Normal osseous mineralization.  Orthopedic screw noted to the distal portion of the fourth metatarsal which appears intact and stable as well as K wire fixation to the first metatarsal.  The second and third metatarsals extend beyond the metatarsal parabola of the foot which is likely causing the pressure calluses.  Skin marker was placed over the pressure callus of  the foot which directly correlates to third metatarsal head.  Possible history of chronic stress reaction fractures along the second metatarsal also noted.  No acute fracture identified at the moment  Assessment: 1.  Preulcerative callus lesions bilateral feet 2.  Elongated metatarsals second and third right 3. H/o bunionectomy and fourth metatarsal osteotomy surgery RT. 2010 4.  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    Plan of Care:  1. Patient evaluated. X-Rays reviewed.  2.  Excisional debridement of the hyperkeratotic preulcerative callus tissue was performed using 312 scalpel without incident or bleeding 3.   Today we discussed the findings on x-ray explaining the pain and tenderness underneath the second and third metatarsal heads because they extend beyond the metatarsal parabola.  We did discuss possible surgery to shorten his metatarsal heads to a better anatomical length which should help alleviate the patient's symptoms and pain.  She will think about this 4.  Patient states that she would not like to have the ingrown toenails addressed today.  She has several doctors appointments that she needs to go to today and she will reschedule perhaps on a Friday in [place] that she can have the ingrown toenail procedures performed so she has the weekend to rest 5.  Return to clinic for ingrown toenail procedures      Edrick Kins, DPM [facility]  Dr. Edrick Kins, DPM    [address], [place] [address]                Office [phone]  Fax [phone]

## 2021-05-08 ENCOUNTER — Encounter: Payer: Self-pay | Admitting: Gastroenterology

## 2021-05-21 NOTE — Progress Notes (Signed)
Regina [name] [address] [place] Phone: [phone] Subjective:   Regina [name], am serving as a scribe for Dr. Hulan Saas. This visit occurred during the SARS-CoV-2 public health emergency.  Safety protocols were in place, including screening questions prior to the visit, additional usage of staff PPE, and extensive cleaning of exam room while observing appropriate contact time as indicated for disinfecting solutions.   I'm seeing this patient by the request  of:  Plotnikov, [name], MD  CC: Neck and back pain follow-up  UKG:[id number]  Regina [name] is a 56 y.o. female coming in with complaint of back and neck pain. OMT on 04/22/2021. Patient states same per usual. No new complaints.  Has had a lot of stress recently, patient has not had a lot of time for self, not to find the motivation to work out regularly on her own at the moment.  Medications patient has been prescribed: None  Taking:         Reviewed prior external information including notes and imaging from previsou exam, outside providers and external EMR if available.   As well as notes that were available from care everywhere and other healthcare systems.  Past medical history, social, surgical and family history all reviewed in electronic medical record.  No pertanent information unless stated regarding to the chief complaint.   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    Morphine Sulfate Nausea And Vomiting   Sulfacetamide Sodium Swelling   Caffeine Other (See Comments)    Induces A-fib   Morphine And Related     nausea   Sulfa Antibiotics Swelling   Erythromycin Stearate Hives and Swelling    CAN TAKE Z-PAK     Review of Systems:  No headache, visual changes, nausea, vomiting, diarrhea, constipation, dizziness, abdominal pain, skin rash, fevers, chills, night sweats, weight loss, swollen lymph nodes, joint swelling, chest pain, shortness of breath, mood changes. POSITIVE muscle aches, body ache  Objective  Blood pressure 98/68, pulse 66, height 5\' 4"  (1.626 m), weight 174 lb (78.9 kg), SpO2 98 %.   General: No apparent distress alert and oriented x3 mood and affect normal, dressed appropriately.  HEENT: Pupils equal, extraocular movements intact  Respiratory: Patient's speak in full sentences and does not appear short of breath  Cardiovascular: No lower extremity edema, non tender, no erythema  Tightness in the lower back noted.  Discussed posture and ergonomics, discussed which activities to do which ones to avoid, increase activity slowly  Osteopathic findings   T7 extended rotated  and side bent left L1 flexed rotated and side bent right Sacrum right on right       Assessment and Plan:  Low back pain Low back pain is multifactorial.  Discussed with patient icing regimen and home exercises, still having difficulty taking care of herself at the moment.  Pending osteopathic manipulation and is making some progress with it.  Patient also avoid a lot of prescription medications.  Discussed with patient in great amount of time today greater than 31 minutes.  Follow-up again in 6 weeks    Nonallopathic problems  Decision today to treat with OMT was based on Physical Exam  After verbal consent patient was treated with HVLA, ME, FPR techniques in  thoracic, lumbar, and sacral  areas  Patient tolerated the procedure well with improvement in symptoms  Patient given exercises, stretches and lifestyle modifications  See medications in patient instructions if given  Patient will follow up in 4-8 weeks      The above documentation has been reviewed and is accurate and complete Lyndal Pulley, DO        Note: This dictation was prepared with Dragon dictation along with smaller phrase technology. Any transcriptional errors that result from this process are unintentional.

## 2021-05-22 ENCOUNTER — Other Ambulatory Visit: Payer: Self-pay

## 2021-05-22 ENCOUNTER — Ambulatory Visit (INDEPENDENT_AMBULATORY_CARE_PROVIDER_SITE_OTHER): Payer: BC Managed Care – PPO | Admitting: Family Medicine

## 2021-05-22 VITALS — BP 98/68 | HR 66 | Ht 64.0 in | Wt 174.0 lb

## 2021-05-22 DIAGNOSIS — M9904 Segmental and somatic dysfunction of sacral region: Secondary | ICD-10-CM

## 2021-05-22 DIAGNOSIS — M5441 Lumbago with sciatica, right side: Secondary | ICD-10-CM

## 2021-05-22 DIAGNOSIS — M9902 Segmental and somatic dysfunction of thoracic region: Secondary | ICD-10-CM

## 2021-05-22 DIAGNOSIS — M9903 Segmental and somatic dysfunction of lumbar region: Secondary | ICD-10-CM | POA: Diagnosis not present

## 2021-05-22 DIAGNOSIS — G8929 Other chronic pain: Secondary | ICD-10-CM

## 2021-05-22 NOTE — Assessment & Plan Note (Signed)
Low back pain is multifactorial.  Discussed with patient icing regimen and home exercises, still having difficulty taking care of herself at the moment.  Pending osteopathic manipulation and is making some progress with it.  Patient also avoid a lot of prescription medications.  Discussed with patient in great amount of time today greater than 31 minutes.  Follow-up again in 6 weeks

## 2021-05-22 NOTE — Patient Instructions (Signed)
Good to see you! Find time for yourself See you again in when you're scheduled

## 2021-05-23 ENCOUNTER — Encounter: Payer: Self-pay | Admitting: Podiatry

## 2021-05-23 ENCOUNTER — Ambulatory Visit: Payer: BC Managed Care – PPO | Admitting: Podiatry

## 2021-05-23 DIAGNOSIS — M79675 Pain in left toe(s): Secondary | ICD-10-CM

## 2021-05-23 DIAGNOSIS — M79674 Pain in right toe(s): Secondary | ICD-10-CM

## 2021-05-23 DIAGNOSIS — L989 Disorder of the skin and subcutaneous tissue, unspecified: Secondary | ICD-10-CM | POA: Diagnosis not present

## 2021-05-23 DIAGNOSIS — B351 Tinea unguium: Secondary | ICD-10-CM

## 2021-05-29 ENCOUNTER — Encounter: Payer: Self-pay | Admitting: Gastroenterology

## 2021-06-02 NOTE — Progress Notes (Signed)
HPI: 56 y.o. female presenting today for follow-up evaluation of symptomatic calluses to the bilateral feet.  She does have history of right foot surgery back in 2010.  Patient states that she has been developing symptomatic calluses and pain to the right forefoot.  She says that the calluses do feel somewhat better since they were debrided last visit  Also the patient states that she has had chronic ingrown toenails to the bilateral great toes for several years.  Last visit the patient did not want any treatment for the ingrown's.  Today she says the ingrown's are not as bad as they have been traditionally.  She routinely gets pedicures and they try to take them out..  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 Rise [name] 2009   OVARIAN CYST REMOVAL     RT BUNIONECTOMT     TONSILLECTOMY AND ADENOIDECTOMY     TUBAL LIGATION     UTERINE FIBROID SURGERY      Allergies  Allergen Reactions   Indocin [Indomethacin] Swelling, Rash and Other (See Comments)    REACTION: lupus like illness; butterfly rash, pain and swelling all over body.    Morphine Sulfate Nausea  And Vomiting   Sulfacetamide Sodium Swelling   Caffeine Other (See Comments)    Induces A-fib   Morphine And Related     nausea   Sulfa Antibiotics Swelling   Erythromycin Stearate Hives and Swelling    CAN TAKE Z-PAK     Physical Exam: General: The patient is alert and oriented x3 in no acute distress.  Dermatology: Skin is warm, dry and supple bilateral lower extremities.  Hyperkeratotic calluses noted bilateral feet with associated tenderness to palpation.  Ingrowing toenails also noted to the medial border of the bilateral great toes with associated tenderness to palpation with some hyperkeratosis and discoloration of the nail plates  Vascular: Palpable pedal pulses bilaterally. Capillary refill within normal limits.  Negative for any significant edema or erythema  Neurological: Light touch and protective threshold grossly intact  Musculoskeletal Exam: There continues to be some pain on palpation along the second and third metatarsal heads consistent with radiographic findings and chronic metatarsalgia.  There is also pain on palpation to the calluses noted to the bilateral feet  Radiographic Exam 05/07/2021 RT foot:  Normal osseous mineralization.  Orthopedic screw noted to the distal portion of the fourth metatarsal which appears intact and  stable as well as K wire fixation to the first metatarsal.  The second and third metatarsals extend beyond the metatarsal parabola of the foot which is likely causing the pressure calluses.  Skin marker was placed over the pressure callus of the foot which directly correlates to third metatarsal head.  Possible history of chronic stress reaction fractures along the second metatarsal also noted.  No acute fracture identified at the moment  Assessment: 1.  Preulcerative callus lesions bilateral feet 2.  Elongated metatarsals second and third right 3. H/o bunionectomy and fourth metatarsal osteotomy surgery RT. 2010 4.  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    Plan of Care:  1. Patient evaluated. X-Rays reviewed.  2.  Excisional debridement of the hyperkeratotic preulcerative callus tissue was performed using 312 scalpel without incident or bleeding 3.  Patient states that her chronic ingrown toenails are somewhat tolerable at the moment.  She would like to try conservative treatment 4.  OTC Tolcylen antifungal topical was provided at checkout 5.  Return to clinic as needed     Edrick Kins, DPM [facility]  Dr. Edrick Kins, DPM    [address], [place] [address]                Office [phone]  Fax [phone]

## 2021-06-04 ENCOUNTER — Ambulatory Visit (AMBULATORY_SURGERY_CENTER): Payer: BC Managed Care – PPO | Admitting: Gastroenterology

## 2021-06-04 ENCOUNTER — Encounter: Payer: Self-pay | Admitting: Gastroenterology

## 2021-06-04 VITALS — BP 106/58 | HR 62 | Temp 97.1°F | Resp 14 | Ht 64.0 in | Wt 170.0 lb

## 2021-06-04 DIAGNOSIS — R12 Heartburn: Secondary | ICD-10-CM

## 2021-06-04 DIAGNOSIS — K219 Gastro-esophageal reflux disease without esophagitis: Secondary | ICD-10-CM | POA: Diagnosis not present

## 2021-06-04 DIAGNOSIS — K2960 Other gastritis without bleeding: Secondary | ICD-10-CM | POA: Diagnosis not present

## 2021-06-04 DIAGNOSIS — D120 Benign neoplasm of cecum: Secondary | ICD-10-CM | POA: Diagnosis not present

## 2021-06-04 DIAGNOSIS — K449 Diaphragmatic hernia without obstruction or gangrene: Secondary | ICD-10-CM

## 2021-06-04 DIAGNOSIS — Z1211 Encounter for screening for malignant neoplasm of colon: Secondary | ICD-10-CM | POA: Diagnosis not present

## 2021-06-04 DIAGNOSIS — K589 Irritable bowel syndrome without diarrhea: Secondary | ICD-10-CM

## 2021-06-04 MED ORDER — SODIUM CHLORIDE 0.9 % IV SOLN: 500.0000 mL | Freq: Once | INTRAVENOUS | Status: DC

## 2021-06-04 MED ORDER — PANTOPRAZOLE SODIUM 40 MG PO TBEC: 40.0000 mg | DELAYED_RELEASE_TABLET | Freq: Every day | ORAL | 0 refills | Status: DC

## 2021-06-04 NOTE — Op Note (Signed)
[place] ?Patient Name: Regina [name] ?Procedure Date: 06/04/2021 2:49 PM ?MRN: [id number] ?Endoscopist: Mauri Pole , MD ?Age: 56 ?Referring MD:  ?Date of Birth: Feb 01, 1966 ?Gender: Female ?Account #: [id number] ?Procedure:                Upper GI endoscopy ?Indications:              Dyspepsia, Heartburn, Esophageal reflux symptoms  ?                          that persist despite appropriate therapy ?Medicines:                Monitored Anesthesia Care ?Procedure:                Pre-Anesthesia Assessment: ?                          - Prior to the procedure, a History and Physical  ?                          was performed, and patient medications and  ?                          allergies were reviewed. The patient's tolerance of  ?                          previous anesthesia was also reviewed. The risks  ?                          and benefits of the procedure and the sedation  ?                          options and risks were discussed with the patient.  ?                          All questions were answered, and informed consent  ?                          was obtained. Prior Anticoagulants: The patient has  ?                          taken no previous anticoagulant or antiplatelet  ?                          agents. ASA Grade Assessment: II - A patient with  ?                          mild systemic disease. After reviewing the risks  ?                          and benefits, the patient was deemed in  ?                          satisfactory condition to undergo the procedure. ?  After obtaining informed consent, the endoscope was  ?                          passed under direct vision. Throughout the  ?                          procedure, the patient's blood pressure, pulse, and  ?                          oxygen saturations were monitored continuously. The  ?                          GIF [id number] #[id number] was introduced through the  ?                          mouth, and  advanced to the second part of duodenum.  ?                          The upper GI endoscopy was accomplished without  ?                          difficulty. The patient tolerated the procedure  ?                          well. ?Scope In: ?Scope Out: ?Findings:                 The Z-line was regular and was found 35 cm from the  ?                          incisors. ?                          LA Grade B (one or more mucosal breaks greater than  ?                          5 mm, not extending between the tops of two mucosal  ?                          folds) esophagitis was found 34 to 36 cm from the  ?                          incisors. ?                          A 2 cm hiatal hernia was present. ?                          Patchy mild inflammation characterized by  ?                          congestion (edema) and erythema was found in the  ?                          entire examined stomach.  Biopsies were taken with a  ?                          cold forceps for Helicobacter pylori testing. ?                          The examined duodenum was normal. ?Complications:            No immediate complications. ?Estimated Blood Loss:     Estimated blood loss was minimal. ?Impression:               - Z-line regular, 35 cm from the incisors. ?                          - LA Grade B reflux esophagitis. ?                          - 2 cm hiatal hernia. ?                          - Gastritis. Biopsied. ?                          - Normal examined duodenum. ?Recommendation:           - Follow an antireflux regimen. ?                          - Resume previous diet. ?                          - Continue present medications. ?                          - Await pathology results. ?                          - Use Protonix (pantoprazole) 40 mg PO daily for 3  ?                          months. ?Mauri Pole, MD ?06/04/2021 3:32:19 PM ?This report has been signed electronically. ?

## 2021-06-04 NOTE — Progress Notes (Signed)
Called to room to assist during endoscopic procedure.  Patient ID and intended procedure confirmed with present staff. Received instructions for my participation in the procedure from the performing physician.  

## 2021-06-04 NOTE — Progress Notes (Signed)
[facility] History and Physical ? ? ?Primary Care Physician:  Cassandria Anger, MD ? ? ?Reason for Procedure:  Persistent heartburn, colorectal cancer screening ? ?Plan:    EGD and colonoscopy with possible interventions as needed ? ? ? ? ?HPI: Regina [name] is a very pleasant 56 y.o. female here for EGD and colonoscopy for evaluation of persistent heartburn and for colorectal cancer screening. ? ?Please refer to office visit 04/30/21 for additional details. ? ?The risks and benefits as well as alternatives of endoscopic procedure(s) have been discussed and reviewed. All questions answered. The patient agrees to proceed. ? ? ? ?Past Medical History:  ?Diagnosis Date  ? Allergic urticaria   ? ARTHRALGIA   ? Arthritis   ? Atrial fibrillation ([place])   ? B12 DEFICIENCY   ? CFS (chronic fatigue syndrome)   ? Endometriosis   ? Epidural hemorrhage with loss of consciousness ([place]) 06/29/15  ? Fibroid   ? Fibromyalgia   ? Fracture of cervical vertebra, C6 ([place]) 06/29/15  ? Gastritis   ? GERD   ? Heart murmur   ? History of endometriosis   ? IBS (irritable bowel syndrome)   ? Internal hemorrhoids   ? MVP (mitral valve prolapse)   ? Osteoarthritis   ? Rosacea   ? Skin cancer of arm, right   ? Thoracic compression fracture ([place]) 06/29/15  ? T2-T6  ? ? ?Past Surgical History:  ?Procedure Laterality Date  ? ABDOMINAL HYSTERECTOMY    ? partial  ? ABDOMINAL HYSTERECTOMY    ? ANAL FISSURE REPAIR    ? Dr Rise [name] 2009  ? APPENDECTOMY    ? COLPOSCOPY    ? HEMORRHOID SURGERY    ? Dr. Rise [name] 2009  ? OVARIAN CYST REMOVAL    ? RT BUNIONECTOMT    ? TONSILLECTOMY AND ADENOIDECTOMY    ? TUBAL LIGATION    ? UTERINE FIBROID SURGERY    ? ? ?Prior to Admission medications   ?Medication Sig Start Date End Date Taking? Authorizing Provider  ?Cholecalciferol (VITAMIN D3) 125 MCG (5000 UT) TABS Take 10,000 Units by mouth daily.   Yes [provider]  ?cyanocobalamin (,VITAMIN B-12,) 1000 MCG/ML injection Inject into the  muscle. 01/26/20  Yes [provider]  ?Ascorbic Acid (VITAMIN C PO) Take by mouth.    [provider]  ?famotidine (PEPCID) 20 MG tablet Take 1 tablet (20 mg total) by mouth at bedtime. 04/30/21   Mauri Pole, MD  ?loratadine (CLARITIN) 10 MG tablet Take 1 tablet (10 mg total) by mouth daily. 02/13/20   Plotnikov, [name], MD  ?Magnesium 400 MG TABS Take 1 tablet by mouth daily.    [provider]  ?Omega-3 Fatty Acids (FISH OIL PO) Take by mouth.    [provider]  ?Probiotic Product (PROBIOTIC PO) Take by mouth.    [provider]  ?SYRINGE-NEEDLE, DISP, 3 ML (BD ECLIPSE SYRINGE) 25G X 1" 3 ML MISC For SQ Vit B12 shots 07/12/19   Plotnikov, [name], MD  ?Turmeric 500 MG TABS Take by mouth 2 (two) times daily.    [provider]  ?UNABLE TO FIND Immunity booster    [provider]  ?VITAMIN K PO Take 1 tablet by mouth every other day.    [provider]  ?ZINC GLUCONATE PO Take 1 tablet by mouth daily.    [provider]  ? ? ?Current Outpatient Medications  ?Medication Sig Dispense Refill  ? Cholecalciferol (VITAMIN D3)  125 MCG (5000 UT) TABS Take 10,000 Units by mouth daily.    ? cyanocobalamin (,VITAMIN B-12,) 1000 MCG/ML injection Inject into the muscle.    ? Ascorbic Acid (VITAMIN C PO) Take by mouth.    ? famotidine (PEPCID) 20 MG tablet Take 1 tablet (20 mg total) by mouth at bedtime. 30 tablet 3  ? loratadine (CLARITIN) 10 MG tablet Take 1 tablet (10 mg total) by mouth daily. 90 tablet 5  ? Magnesium 400 MG TABS Take 1 tablet by mouth daily.    ? Omega-3 Fatty Acids (FISH OIL PO) Take by mouth.    ? Probiotic Product (PROBIOTIC PO) Take by mouth.    ? SYRINGE-NEEDLE, DISP, 3 ML (BD ECLIPSE SYRINGE) 25G X 1" 3 ML MISC For SQ Vit B12 shots 50 each 3  ? Turmeric 500 MG TABS Take by mouth 2 (two) times daily.    ? UNABLE TO FIND Immunity booster    ? VITAMIN K PO Take 1 tablet by mouth every other day.    ? ZINC  GLUCONATE PO Take 1 tablet by mouth daily.    ? ?Current Facility-Administered Medications  ?Medication Dose Route Frequency Provider Last Rate Last Admin  ? 0.9 %  sodium chloride infusion  500 mL Intravenous Once Sintia Mckissic, Venia Minks, MD      ? ? ?Allergies as of 06/04/2021 - Review Complete 06/04/2021  ?Allergen Reaction Noted  ? Indocin [indomethacin] Swelling, Rash, and Other (See Comments) 07/26/2009  ? Morphine sulfate Nausea And Vomiting   ? Sulfacetamide sodium Swelling   ? Caffeine Other (See Comments) 07/24/2015  ? Morphine and related  06/29/2015  ? Sulfa antibiotics Swelling 06/29/2015  ? Erythromycin stearate Hives and Swelling   ? ? ?Family History  ?Problem Relation Age of Onset  ? Lung cancer Mother   ? Thyroid cancer Mother   ? Cancer Mother   ?     INTESTINAL  ? Healthy Father   ? Von Willebrand disease Daughter   ? Breast cancer Paternal Aunt   ? Diabetes Other   ?     uncles/aunts  ? Asthma Neg Hx   ? Stomach cancer Neg Hx   ? Colon cancer Neg Hx   ? Esophageal cancer Neg Hx   ? Pancreatic cancer Neg Hx   ? ? ?Social History  ? ?Socioeconomic History  ? Marital status: Married  ?  Spouse name: Not on file  ? Number of children: Not on file  ? Years of education: Not on file  ? Highest education level: Not on file  ?Occupational History  ? Not on file  ?Tobacco Use  ? Smoking status: Former  ?  Types: Cigarettes  ?  Quit date: 10/31/1988  ?  Years since quitting: 32.6  ? Smokeless tobacco: Never  ?Vaping Use  ? Vaping Use: Never used  ?Substance and Sexual Activity  ? Alcohol use: Yes  ?  Alcohol/week: 1.0 - 2.0 standard drink  ?  Types: 1 - 2 Standard drinks or equivalent per week  ?  Comment: occasional   ? Drug use: No  ? Sexual activity: Yes  ?  Partners: Male  ?  Birth control/protection: Surgical  ?  Comment: hysterectomy  ?Other Topics Concern  ? Not on file  ?Social History Narrative  ? ** Merged History Encounter **  ?    ? Working 2 jobs, 6d/wk  ?   ? Regular exercise-no  ?   ? Divorced   ? ?  Social Determinants of Health  ? ?Financial Resource Strain: Not on file  ?Food Insecurity: Not on file  ?Transportation Needs: Not on file  ?Physical Activity: Not on file  ?Stress: Not on file  ?Social Connections: Not on file  ?Intimate Partner Violence: Not on file  ? ? ?Review of Systems: ? ?All other review of systems negative except as mentioned in the HPI. ? ?Physical Exam: ?Vital signs in last 24 hours: ?BP 104/67   Pulse 70   Temp (!) 97.1 ?F (36.2 ?C)   Ht '5\' 4"'$  (1.626 m)   Wt 170 lb (77.1 kg)   SpO2 99%   BMI 29.18 kg/m?  ?General:   Alert, NAD ?Lungs:  Clear .   ?Heart:  Regular rate and rhythm ?Abdomen:  Soft, nontender and nondistended. ?Neuro/Psych:  Alert and cooperative. Normal mood and affect. A and O x 3 ? ?Reviewed labs, radiology imaging, old records and pertinent past GI work up ? ?Patient is appropriate for planned procedure(s) and anesthesia in an ambulatory setting ? ? ?K. Denzil Magnuson , MD ?[phone]  ? ? ?  ?

## 2021-06-04 NOTE — Patient Instructions (Addendum)
Handouts given for diverticulosis, hiatal hernia, gastritis,  anti-reflux regimen and polyps.  Resume previous diet and continue present medications.  Await pathology results.  ? ?Use Protonix (pantoprazole) 40 mg daily oral for 3 months. ? ? ?YOU HAD AN ENDOSCOPIC PROCEDURE TODAY AT [facility]:   Refer to the procedure report that was given to you for any specific questions about what was found during the examination.  If the procedure report does not answer your questions, please call your gastroenterologist to clarify.  If you requested that your care partner not be given the details of your procedure findings, then the procedure report has been included in a sealed envelope for you to review at your convenience later. ? ?YOU SHOULD EXPECT: Some feelings of bloating in the abdomen. Passage of more gas than usual.  Walking can help get rid of the air that was put into your GI tract during the procedure and reduce the bloating. If you had a lower endoscopy (such as a colonoscopy or flexible sigmoidoscopy) you may notice spotting of blood in your stool or on the toilet paper. If you underwent a bowel prep for your procedure, you may not have a normal bowel movement for a few days. ? ?Please Note:  You might notice some irritation and congestion in your nose or some drainage.  This is from the oxygen used during your procedure.  There is no need for concern and it should clear up in a day or so. ? ?SYMPTOMS TO REPORT IMMEDIATELY: ? ?Following lower endoscopy (colonoscopy or flexible sigmoidoscopy): ? Excessive amounts of blood in the stool ? Significant tenderness or worsening of abdominal pains ? Swelling of the abdomen that is new, acute ? Fever of 100?F or higher ? ? ?For urgent or emergent issues, a gastroenterologist can be reached at any hour by calling [phone]. ?Do not use MyChart messaging for urgent concerns.  ? ? ?DIET:  We do recommend a small meal at first, but then you may  proceed to your regular diet.  Drink plenty of fluids but you should avoid alcoholic beverages for 24 hours. ? ?ACTIVITY:  You should plan to take it easy for the rest of today and you should NOT DRIVE or use heavy machinery until tomorrow (because of the sedation medicines used during the test).   ? ?FOLLOW UP: ?Our staff will call the number listed on your records 48-72 hours following your procedure to check on you and address any questions or concerns that you may have regarding the information given to you following your procedure. If we do not reach you, we will leave a message.  We will attempt to reach you two times.  During this call, we will ask if you have developed any symptoms of COVID 19. If you develop any symptoms (ie: fever, flu-like symptoms, shortness of breath, cough etc.) before then, please call [phone].  If you test positive for Covid 19 in the 2 weeks post procedure, please call and report this information to [place].   ? ?If any biopsies were taken you will be contacted by phone or by letter within the next 1-3 weeks.  Please call us at [phone] if you have not heard about the biopsies in 3 weeks.  ? ? ?SIGNATURES/CONFIDENTIALITY: ?You and/or your care partner have signed paperwork which will be entered into your electronic medical record.  These signatures attest to the fact that that the information above on your After Visit Summary has been reviewed  and is understood.  Full responsibility of the confidentiality of this discharge information lies with you and/or your care-partner.  ?

## 2021-06-04 NOTE — Progress Notes (Signed)
To pacu, VSS. Report to [email or web address] 

## 2021-06-04 NOTE — Op Note (Signed)
[place] ?Patient Name: Regina [name] ?Procedure Date: 06/04/2021 2:49 PM ?MRN: [id number] ?Endoscopist: Mauri Pole , MD ?Age: 56 ?Referring MD:  ?Date of Birth: [birth year] ?Gender: Female ?Account #: [id number] ?Procedure:                Colonoscopy ?Indications:              Screening for colorectal malignant neoplasm ?Medicines:                Monitored Anesthesia Care ?Procedure:                Pre-Anesthesia Assessment: ?                          - Prior to the procedure, a History and Physical  ?                          was performed, and patient medications and  ?                          allergies were reviewed. The patient's tolerance of  ?                          previous anesthesia was also reviewed. The risks  ?                          and benefits of the procedure and the sedation  ?                          options and risks were discussed with the patient.  ?                          All questions were answered, and informed consent  ?                          was obtained. Prior Anticoagulants: The patient has  ?                          taken no previous anticoagulant or antiplatelet  ?                          agents. ASA Grade Assessment: II - A patient with  ?                          mild systemic disease. After reviewing the risks  ?                          and benefits, the patient was deemed in  ?                          satisfactory condition to undergo the procedure. ?                          After obtaining informed consent, the colonoscope  ?  was passed under direct vision. Throughout the  ?                          procedure, the patient's blood pressure, pulse, and  ?                          oxygen saturations were monitored continuously. The  ?                          PCF-HQ190L Colonoscope was introduced through the  ?                          anus and advanced to the the cecum, identified by  ?                          appendiceal  orifice and ileocecal valve. The  ?                          colonoscopy was performed without difficulty. The  ?                          patient tolerated the procedure well. The quality  ?                          of the bowel preparation was good. The ileocecal  ?                          valve, appendiceal orifice, and rectum were  ?                          photographed. ?Scope In: 3:07:10 PM ?Scope Out: 3:20:57 PM ?Scope Withdrawal Time: 0 hours 9 minutes 5 seconds  ?Total Procedure Duration: 0 hours 13 minutes 47 seconds  ?Findings:                 The perianal and digital rectal examinations were  ?                          normal. ?                          Two sessile polyps were found in the cecum. The  ?                          polyps were 4 to 6 mm in size. These polyps were  ?                          removed with a cold snare. Resection and retrieval  ?                          were complete. ?                          Scattered small-mouthed diverticula were found in  ?  the sigmoid colon and descending colon. ?                          Non-bleeding external and internal hemorrhoids were  ?                          found during retroflexion. The hemorrhoids were  ?                          medium-sized. ?Complications:            No immediate complications. ?Estimated Blood Loss:     Estimated blood loss was minimal. ?Impression:               - Two 4 to 6 mm polyps in the cecum, removed with a  ?                          cold snare. Resected and retrieved. ?                          - Diverticulosis in the sigmoid colon and in the  ?                          descending colon. ?                          - Non-bleeding external and internal hemorrhoids. ?Recommendation:           - Patient has a contact number available for  ?                          emergencies. The signs and symptoms of potential  ?                          delayed complications were discussed with the  ?                           patient. Return to normal activities tomorrow.  ?                          Written discharge instructions were provided to the  ?                          patient. ?                          - Resume previous diet. ?                          - Continue present medications. ?                          - Await pathology results. ?                          - Repeat colonoscopy in 5-10 years for surveillance  ?  based on pathology results. ?Mauri Pole, MD ?06/04/2021 3:28:51 PM ?This report has been signed electronically. ?

## 2021-06-04 NOTE — Progress Notes (Signed)
VS  DT ? ?Pt's states no medical or surgical changes since previsit or office visit. ? ?

## 2021-06-06 ENCOUNTER — Telehealth: Payer: Self-pay

## 2021-06-06 NOTE — Telephone Encounter (Signed)
Called patient. Unable to reach or leave a voice message "mailbox was full".  ?On review of message below, she has no abdominal pain and is tolerating diet. Agree with adequate hydration and Tylenol as needed. If headache is persistent, patient will need to consult her PCP or Migraine specialist.  ?

## 2021-06-06 NOTE — Telephone Encounter (Signed)
?  Follow up Call- ? ?Call back number 06/04/2021  ?Post procedure Call Back phone  # [phone]  ?Permission to leave phone message No  ?Some recent data might be hidden  ?  ? ?Patient questions: ? ?Do you have a fever, pain , or abdominal swelling? Yes.   ?Pain Score  7 * ? ?Have you tolerated food without any problems? Yes.   ? ?Have you been able to return to your normal activities? Yes.   ? ?Do you have any questions about your discharge instructions: ?Diet   No. ?Medications  No. ?Follow up visit  No. ? ?Do you have questions or concerns about your Care? No. ? ?Actions: ?* If pain score is 4 or above: ?No action needed, pain <4. ? ? ? ?Patient states that she has a headache 7/10 causing shakes and nausea that started after procedure and has not gone away. States she is eating and drinking normally. No abdominal pain or swelling. No chest pain.    ? ? ?

## 2021-06-12 ENCOUNTER — Encounter: Payer: Self-pay | Admitting: Gastroenterology

## 2021-06-18 NOTE — Progress Notes (Signed)
?Regina [name] D.O. ?[facility] ?[place] ?Phone: [phone] ?Subjective:   ?I, Regina [name], am serving as a scribe for Dr. Hulan Saas. ? ?This visit occurred during the SARS-CoV-2 public health emergency.  Safety protocols were in place, including screening questions prior to the visit, additional usage of staff PPE, and extensive cleaning of exam room while observing appropriate contact time as indicated for disinfecting solutions.  ? ? ?I'm seeing this patient by the request  of:  Plotnikov, [name], MD ? ?CC: Low back and neck pain ? ?[id number]  ?Regina [name] is a 56 y.o. female coming in with complaint of back and neck pain. OMT on 2/23/203. Patient states that she is doing a little better.  ? ?Medications patient has been prescribed: None ? ?Taking: ? ? ?  ? ? ? ? ?Reviewed prior external information including notes and imaging from previsou exam, outside providers and external EMR if available.  ? ?As well as notes that were available from care everywhere and other healthcare systems. ? ?Past medical history, social, surgical and family history all reviewed in electronic medical record.  No pertanent information unless stated regarding to the chief complaint.  ? ?Past Medical History:  ?Diagnosis Date  ? Allergic urticaria   ? ARTHRALGIA   ? Arthritis   ? Atrial fibrillation ([place])   ? B12 DEFICIENCY   ? CFS (chronic fatigue syndrome)   ? Endometriosis   ? Epidural hemorrhage with loss of consciousness ([place]) 06/29/15  ? Fibroid   ? Fibromyalgia   ? Fracture of cervical vertebra, C6 ([place]) 06/29/15  ? Gastritis   ? GERD   ? Heart murmur   ? History of endometriosis   ? IBS (irritable bowel syndrome)   ? Internal hemorrhoids   ? MVP (mitral valve prolapse)   ? Osteoarthritis   ? Rosacea   ? Skin cancer of arm, right   ? Thoracic compression fracture ([place]) 06/29/15  ? T2-T6  ?  ?Allergies  ?Allergen Reactions  ? Indocin [Indomethacin] Swelling, Rash and Other (See  Comments)  ?  REACTION: lupus like illness; butterfly rash, pain and swelling all over body.   ? Morphine Sulfate Nausea And Vomiting  ? Sulfacetamide Sodium Swelling  ? Caffeine Other (See Comments)  ?  Induces A-fib  ? Morphine And Related   ?  nausea  ? Sulfa Antibiotics Swelling  ? Erythromycin Stearate Hives and Swelling  ?  CAN TAKE Z-PAK  ? ? ? ?Review of Systems: ? No headache, visual changes, nausea, vomiting, diarrhea, constipation, dizziness, abdominal pain, skin rash, fevers, chills, night sweats, weight loss, swollen lymph nodes, body aches, joint swelling, chest pain, shortness of breath, mood changes. POSITIVE muscle aches ? ?Objective  ?Blood pressure 100/66, pulse 65, height '5\' 4"'$  (1.626 m), weight 171 lb (77.6 kg), SpO2 97 %. ?  ?General: No apparent distress alert and oriented x3 mood and affect normal, dressed appropriately.  ?HEENT: Pupils equal, extraocular movements intact  ?Respiratory: Patient's speak in full sentences and does not appear short of breath  ?Cardiovascular: No lower extremity edema, non tender, no erythema  ?Back exam does have significant loss of lordosis.  Tightness noted more around the left sacroiliac joint.  The patient does have some mild tightness in the paraspinal musculature as well ? ?Osteopathic findings ? ?C5 flexed rotated and side bent left ?T3 extended rotated and side bent right inhaled rib ?T8 extended rotated and side bent left ?L2 flexed  rotated and side bent right ?Sacrum left on left ? ? ?  ?Assessment and Plan: ? ?Low back pain ?Chronic, mild exacerbation.  Discussed icing regimen and home exercises, discussed which activities to do which ones to avoid.  Increase activity slowly.  F/u 6-8 weeks  ?  ? ?Nonallopathic problems ? ?Decision today to treat with OMT was based on Physical Exam ? ?After verbal consent patient was treated with , ME, FPR techniques in cervical, rib, thoracic, lumbar, and sacral  areas avoided HVLA on the cervical spine secondary to  patient's history of cervical fracture ? ?Patient tolerated the procedure well with improvement in symptoms ? ?Patient given exercises, stretches and lifestyle modifications ? ?See medications in patient instructions if given ? ?Patient will follow up in 4-8 weeks ? ?  ? ? ?The above documentation has been reviewed and is accurate and complete Lyndal Pulley, DO ? ? ? ?  ? ? Note: This dictation was prepared with Dragon dictation along with smaller phrase technology. Any transcriptional errors that result from this process are unintentional.    ?  ?  ? ?

## 2021-06-19 ENCOUNTER — Ambulatory Visit: Payer: BC Managed Care – PPO | Admitting: Family Medicine

## 2021-06-19 ENCOUNTER — Other Ambulatory Visit: Payer: Self-pay

## 2021-06-19 VITALS — BP 100/66 | HR 65 | Ht 64.0 in | Wt 171.0 lb

## 2021-06-19 DIAGNOSIS — M9901 Segmental and somatic dysfunction of cervical region: Secondary | ICD-10-CM | POA: Diagnosis not present

## 2021-06-19 DIAGNOSIS — M9902 Segmental and somatic dysfunction of thoracic region: Secondary | ICD-10-CM | POA: Diagnosis not present

## 2021-06-19 DIAGNOSIS — M9908 Segmental and somatic dysfunction of rib cage: Secondary | ICD-10-CM | POA: Diagnosis not present

## 2021-06-19 DIAGNOSIS — M9904 Segmental and somatic dysfunction of sacral region: Secondary | ICD-10-CM

## 2021-06-19 DIAGNOSIS — M9903 Segmental and somatic dysfunction of lumbar region: Secondary | ICD-10-CM | POA: Diagnosis not present

## 2021-06-19 DIAGNOSIS — M5441 Lumbago with sciatica, right side: Secondary | ICD-10-CM | POA: Diagnosis not present

## 2021-06-19 DIAGNOSIS — G8929 Other chronic pain: Secondary | ICD-10-CM

## 2021-06-19 NOTE — Patient Instructions (Addendum)
Iron '65mg'$  and Vit C '500mg'$  ?Keep finding time for yourself ?See me in 6-8 weeks ? ? ?

## 2021-06-19 NOTE — Assessment & Plan Note (Signed)
Chronic, mild exacerbation.  Discussed icing regimen and home exercises, discussed which activities to do which ones to avoid.  Increase activity slowly.  F/u 6-8 weeks  ?

## 2021-06-25 ENCOUNTER — Ambulatory Visit: Payer: BC Managed Care – PPO | Admitting: Obstetrics and Gynecology

## 2021-06-25 ENCOUNTER — Encounter: Payer: Self-pay | Admitting: Obstetrics and Gynecology

## 2021-06-25 VITALS — BP 100/62 | HR 77 | Ht 64.0 in | Wt 174.0 lb

## 2021-06-25 DIAGNOSIS — R1032 Left lower quadrant pain: Secondary | ICD-10-CM | POA: Diagnosis not present

## 2021-06-25 NOTE — Progress Notes (Signed)
GYNECOLOGY  VISIT ?  ?HPI: ?56 y.o.   Married White or Caucasian Not Hispanic or Latino  female   ?[id number] with No LMP recorded. Patient has had a hysterectomy.   ?here for left side pain. She says its not constant. She had a colonoscopy and endoscopy early this month.  ? ?She has a several month h/o intermittent LLQ abdominal pain. The pain is a pressure sensation. The pain is random, can be a month apart and then last for 3 days. Feels like her bowel are full and she can't pass it. No consistent improvement with BM. She is having a BM every 1-2 days. Mostly every day. Always has gas.  ?H/O hysterectomy, still has her ovaries.  ? ?Sexually active, no pain. ? ? ?GI note from 04/30/21 was reviewed. IBS, heartburn and bloating.  ? ?Recent colonoscopy with 2 precancerous polyps.  ?Colonoscopy report from 06/04/21 reviewed: ?Findings: - The perianal and digital rectal examinations were normal. ?- Two sessile polyps were found in the cecum. The polyps were 4 to 6 mm in size. These ?polyps were removed with a cold snare. Resection and retrieval were complete. ?- Scattered small-mouthed diverticula were found in the sigmoid colon and descending colon. ?- Non-bleeding external and internal hemorrhoids were found during retroflexion. The ?hemorrhoids were medium-sized. ? ? ?GYNECOLOGIC HISTORY: ?No LMP recorded. Patient has had a hysterectomy. ?Contraception:Hysterectomy  ?Menopausal hormone therapy: none  ?       ?OB History   ? ? Gravida  ?3  ? Para  ?2  ? Term  ?2  ? Preterm  ?0  ? AB  ?1  ? Living  ?2  ?  ? ? SAB  ?1  ? IAB  ?0  ? Ectopic  ?0  ? Multiple  ?   ? Live Births  ?2  ?   ?  ?  ?    ? ?Patient Active Problem List  ? Diagnosis Date Noted  ? Umbilical hernia with obstruction, without gangrene 03/08/2021  ? Herpes zoster 08/12/2020  ? Memory difficulties 07/23/2020  ? Right elbow pain 07/09/2020  ? Patellofemoral syndrome of right knee 12/21/2019  ? Acute medial meniscal tear, right, initial encounter 12/21/2019  ?  Stress at home 10/24/2019  ? Wrist tendonitis 10/24/2019  ? Vitamin D deficiency 05/03/2019  ? Multiple thyroid nodules 03/08/2019  ? Oropharyngeal dysphagia 03/08/2019  ? Constipation 12/26/2018  ? Brachial neuritis of right upper extremity 07/06/2018  ? Panic attacks 10/20/2016  ? Loss of transverse plantar arch of right foot 09/07/2016  ? Aphasia 03/13/2016  ? Post concussion syndrome 11/01/2015  ? Cervical radicular pain 09/20/2015  ? Pain in joint, shoulder region 09/11/2015  ? Skull fracture with concussion ([place]) 07/30/2015  ? Fracture of cervical vertebra, C6 ([place]) 07/30/2015  ? Thoracic compression fracture ([place]) 07/23/2015  ? Closed fracture of base of fourth metacarpal bone of left hand 06/30/2015  ? Fibromyalgia 06/18/2015  ? Rhinitis, chronic 06/18/2015  ? Scaly patch rash 02/26/2015  ? Mitral valve regurgitation 09/27/2014  ? PAT (paroxysmal atrial tachycardia) ([place]) 09/06/2014  ? Asthmatic bronchitis 05/08/2013  ? Benign paroxysmal positional vertigo 05/08/2013  ? Depression 01/10/2013  ? Upper respiratory tract infection 08/25/2012  ? Snoring 08/25/2012  ? IBS (irritable bowel syndrome) 02/08/2012  ? Multiple pulmonary nodules 02/08/2012  ? Somatic dysfunction of spine, thoracic 11/16/2010  ? Gastro-esophageal reflux disease without esophagitis 05/24/2010  ? SINUSITIS, MAXILLARY, CHRONIC 05/20/2010  ? Fatigue 11/20/2009  ? Low back pain 02/20/2009  ?  Rosacea 12/27/2008  ? Allergic urticaria 12/27/2008  ? B12 deficiency 01/02/2008  ? ? ?Past Medical History:  ?Diagnosis Date  ? Allergic urticaria   ? ARTHRALGIA   ? Arthritis   ? Atrial fibrillation ([place])   ? B12 DEFICIENCY   ? CFS (chronic fatigue syndrome)   ? Endometriosis   ? Epidural hemorrhage with loss of consciousness ([place]) 06/29/15  ? Fibroid   ? Fibromyalgia   ? Fracture of cervical vertebra, C6 ([place]) 06/29/15  ? Gastritis   ? GERD   ? Heart murmur   ? History of endometriosis   ? IBS (irritable bowel syndrome)   ? Internal hemorrhoids   ? MVP  (mitral valve prolapse)   ? Osteoarthritis   ? Rosacea   ? Skin cancer of arm, right   ? Thoracic compression fracture ([place]) 06/29/15  ? T2-T6  ? ? ?Past Surgical History:  ?Procedure Laterality Date  ? ABDOMINAL HYSTERECTOMY    ? partial  ? ABDOMINAL HYSTERECTOMY    ? ANAL FISSURE REPAIR    ? Dr Rise [name] 2009  ? APPENDECTOMY    ? COLPOSCOPY    ? HEMORRHOID SURGERY    ? Dr. Rise [name] 2009  ? OVARIAN CYST REMOVAL    ? RT BUNIONECTOMT    ? TONSILLECTOMY AND ADENOIDECTOMY    ? TUBAL LIGATION    ? UTERINE FIBROID SURGERY    ? ? ?Current Outpatient Medications  ?Medication Sig Dispense Refill  ? Ascorbic Acid (VITAMIN C PO) Take by mouth.    ? Cholecalciferol (VITAMIN D3) 125 MCG (5000 UT) TABS Take 10,000 Units by mouth daily.    ? cyanocobalamin (,VITAMIN B-12,) 1000 MCG/ML injection Inject into the muscle.    ? famotidine (PEPCID) 20 MG tablet Take 1 tablet (20 mg total) by mouth at bedtime. 30 tablet 3  ? loratadine (CLARITIN) 10 MG tablet Take 1 tablet (10 mg total) by mouth daily. 90 tablet 5  ? Magnesium 400 MG TABS Take 1 tablet by mouth daily.    ? Omega-3 Fatty Acids (FISH OIL PO) Take by mouth.    ? pantoprazole (PROTONIX) 40 MG tablet Take 1 tablet (40 mg total) by mouth daily. 90 tablet 0  ? Probiotic Product (PROBIOTIC PO) Take by mouth.    ? SYRINGE-NEEDLE, DISP, 3 ML (BD ECLIPSE SYRINGE) 25G X 1" 3 ML MISC For SQ Vit B12 shots 50 each 3  ? Turmeric 500 MG TABS Take by mouth 2 (two) times daily.    ? UNABLE TO FIND Immunity booster    ? VITAMIN K PO Take 1 tablet by mouth every other day.    ? ZINC GLUCONATE PO Take 1 tablet by mouth daily.    ? ?No current facility-administered medications for this visit.  ?  ? ?ALLERGIES: Indocin [indomethacin], Morphine sulfate, Sulfacetamide sodium, Caffeine, Morphine and related, Sulfa antibiotics, and Erythromycin stearate ? ?Family History  ?Problem Relation Age of Onset  ? Lung cancer Mother   ? Thyroid cancer Mother   ? Cancer Mother   ?     INTESTINAL  ? Healthy  Father   ? Von Willebrand disease Daughter   ? Breast cancer Paternal Aunt   ? Diabetes Other   ?     uncles/aunts  ? Asthma Neg Hx   ? Stomach cancer Neg Hx   ? Colon cancer Neg Hx   ? Esophageal cancer Neg Hx   ? Pancreatic cancer Neg Hx   ? ? ?Social History  ? ?  Socioeconomic History  ? Marital status: Married  ?  Spouse name: Not on file  ? Number of children: Not on file  ? Years of education: Not on file  ? Highest education level: Not on file  ?Occupational History  ? Not on file  ?Tobacco Use  ? Smoking status: Former  ?  Types: Cigarettes  ?  Quit date: 10/31/1988  ?  Years since quitting: 32.6  ? Smokeless tobacco: Never  ?Vaping Use  ? Vaping Use: Never used  ?Substance and Sexual Activity  ? Alcohol use: Yes  ?  Alcohol/week: 1.0 - 2.0 standard drink  ?  Types: 1 - 2 Standard drinks or equivalent per week  ?  Comment: occasional   ? Drug use: No  ? Sexual activity: Yes  ?  Partners: Male  ?  Birth control/protection: Surgical  ?  Comment: hysterectomy  ?Other Topics Concern  ? Not on file  ?Social History Narrative  ? ** Merged History Encounter **  ?    ? Working 2 jobs, 6d/wk  ?   ? Regular exercise-no  ?   ? Divorced  ? ?Social Determinants of Health  ? ?Financial Resource Strain: Not on file  ?Food Insecurity: Not on file  ?Transportation Needs: Not on file  ?Physical Activity: Not on file  ?Stress: Not on file  ?Social Connections: Not on file  ?Intimate Partner Violence: Not on file  ? ? ?Review of Systems  ?All other systems reviewed and are negative. ? ?PHYSICAL EXAMINATION:   ? ?There were no vitals taken for this visit.    ?General appearance: alert, cooperative and appears stated age ?Abdomen: soft, tender in the LLQ in the region of the descending colon, mildly distended, no masses,  no organomegaly ? ?Pelvic: External genitalia:  no lesions ?             Urethra:  normal appearing urethra with no masses, tenderness or lesions ?             Bartholins and Skenes: normal    ?             Vagina:  normal appearing vagina with normal color and discharge, no lesions ?             Cervix: absent ?             Bimanual Exam:  Uterus:  uterus absent ?             Adnexa: no mass, fullness, tenderness ?

## 2021-07-15 ENCOUNTER — Encounter: Payer: Self-pay | Admitting: Obstetrics and Gynecology

## 2021-07-15 ENCOUNTER — Other Ambulatory Visit: Payer: BC Managed Care – PPO

## 2021-07-15 ENCOUNTER — Other Ambulatory Visit (HOSPITAL_COMMUNITY)
Admission: RE | Admit: 2021-07-15 | Discharge: 2021-07-15 | Disposition: A | Discharge status: Home / Self Care | Payer: BC Managed Care – PPO | Source: Ambulatory Visit | Attending: Obstetrics and Gynecology | Admitting: Obstetrics and Gynecology

## 2021-07-15 ENCOUNTER — Ambulatory Visit (INDEPENDENT_AMBULATORY_CARE_PROVIDER_SITE_OTHER): Payer: BC Managed Care – PPO | Admitting: Obstetrics and Gynecology

## 2021-07-15 ENCOUNTER — Ambulatory Visit (INDEPENDENT_AMBULATORY_CARE_PROVIDER_SITE_OTHER): Payer: BC Managed Care – PPO

## 2021-07-15 VITALS — BP 110/62 | HR 78 | Ht 64.0 in | Wt 174.0 lb

## 2021-07-15 DIAGNOSIS — Z01419 Encounter for gynecological examination (general) (routine) without abnormal findings: Secondary | ICD-10-CM | POA: Insufficient documentation

## 2021-07-15 DIAGNOSIS — R1032 Left lower quadrant pain: Secondary | ICD-10-CM

## 2021-07-15 DIAGNOSIS — Z1151 Encounter for screening for human papillomavirus (HPV): Secondary | ICD-10-CM | POA: Insufficient documentation

## 2021-07-15 DIAGNOSIS — Z87411 Personal history of vaginal dysplasia: Secondary | ICD-10-CM | POA: Insufficient documentation

## 2021-07-15 DIAGNOSIS — Z87441 Personal history of nephrotic syndrome: Secondary | ICD-10-CM | POA: Insufficient documentation

## 2021-07-15 NOTE — Progress Notes (Signed)
56 y.o. [id number] Married White or Caucasian Not Hispanic or Latino female here for annual exam and f/u of LLQ abdominal pain. She has IBS and mostly has a BM 1 x a day.  ? ?H/O hysterectomy, still has her ovaries. ? ?She was seen last month c/o a several month h/o intermittent LLQ abdominal pain. BM q 1-2 days.   ?GI note from 04/30/21 was reviewed. IBS, heartburn and bloating. Recent colonoscopy with 2 precancerous polyps. ?  ?She has OAB, always has been like that, she has some urge incontinence. Small amounts, no change, present her entire life. Does kegels, doesn't help. Declines medication and PT.   ? ?No LMP recorded. Patient has had a hysterectomy.          ?Sexually active: Yes.    ?The current method of family planning is status post hysterectomy.    ?Exercising: No.   None   She does a lot of walking at work.  ?Smoker:  no  ? ?Health Maintenance: ?Pap: 01/03/20 LSIL HR HPV Neg, 08/24/18, LGSIL HPV Neg, 07/23/2017 WNL NEG HPV, 03-31-16 LGSIL HPV effect + HR HPV, 08-05-15 LGSIL may include HPV or mild dysplasia  ?History of abnormal Pap:  Yes. Colpo of vaginal apex 11/21 VAIN I ?MMG:  03/25/20 Density C Bi-rads 1 neg  ?BMD:   none  ?Colonoscopy: 06/04/21 2 precancerous polyps. F/u in 5 years.  ?TDaP:  06/29/15  ?Gardasil: none  ? ? reports that she quit smoking about 32 years ago. Her smoking use included cigarettes. She has never used smokeless tobacco. She reports current alcohol use of about 1.0 - 2.0 standard drink per week. She reports that she does not use drugs. She is a high school Music therapist. 2 kids, 3 grandkids ? ?Past Medical History:  ?Diagnosis Date  ? Allergic urticaria   ? ARTHRALGIA   ? Arthritis   ? Atrial fibrillation ([place])   ? B12 DEFICIENCY   ? CFS (chronic fatigue syndrome)   ? Endometriosis   ? Epidural hemorrhage with loss of consciousness ([place]) 06/29/15  ? Fibroid   ? Fibromyalgia   ? Fracture of cervical vertebra, C6 ([place]) 06/29/15  ? Gastritis   ? GERD   ? Heart murmur   ? History of endometriosis    ? IBS (irritable bowel syndrome)   ? Internal hemorrhoids   ? MVP (mitral valve prolapse)   ? Osteoarthritis   ? Rosacea   ? Skin cancer of arm, right   ? Thoracic compression fracture ([place]) 06/29/15  ? T2-T6  ? ? ?Past Surgical History:  ?Procedure Laterality Date  ? ABDOMINAL HYSTERECTOMY    ? partial  ? ABDOMINAL HYSTERECTOMY    ? ANAL FISSURE REPAIR    ? Dr Rise [name] 2009  ? APPENDECTOMY    ? COLPOSCOPY    ? HEMORRHOID SURGERY    ? Dr. Rise [name] 2009  ? OVARIAN CYST REMOVAL    ? RT BUNIONECTOMT    ? TONSILLECTOMY AND ADENOIDECTOMY    ? TUBAL LIGATION    ? UTERINE FIBROID SURGERY    ? ? ?Current Outpatient Medications  ?Medication Sig Dispense Refill  ? Ascorbic Acid (VITAMIN C PO) Take by mouth.    ? Cholecalciferol (VITAMIN D3) 125 MCG (5000 UT) TABS Take 10,000 Units by mouth daily.    ? cyanocobalamin (,VITAMIN B-12,) 1000 MCG/ML injection Inject into the muscle.    ? famotidine (PEPCID) 20 MG tablet Take 1 tablet (20 mg total) by mouth at bedtime. 30 tablet  3  ? loratadine (CLARITIN) 10 MG tablet Take 1 tablet (10 mg total) by mouth daily. 90 tablet 5  ? Magnesium 400 MG TABS Take 1 tablet by mouth daily.    ? Omega-3 Fatty Acids (FISH OIL PO) Take by mouth.    ? pantoprazole (PROTONIX) 40 MG tablet Take 1 tablet (40 mg total) by mouth daily. 90 tablet 0  ? Probiotic Product (PROBIOTIC PO) Take by mouth.    ? SYRINGE-NEEDLE, DISP, 3 ML (BD ECLIPSE SYRINGE) 25G X 1" 3 ML MISC For SQ Vit B12 shots 50 each 3  ? Turmeric 500 MG TABS Take by mouth 2 (two) times daily.    ? UNABLE TO FIND Immunity booster    ? VITAMIN K PO Take 1 tablet by mouth every other day.    ? ZINC GLUCONATE PO Take 1 tablet by mouth daily.    ? ?No current facility-administered medications for this visit.  ? ? ?Family History  ?Problem Relation Age of Onset  ? Lung cancer Mother   ? Thyroid cancer Mother   ? Cancer Mother   ?     INTESTINAL  ? Healthy Father   ? Von Willebrand disease Daughter   ? Breast cancer Paternal Aunt   ? Diabetes  Other   ?     uncles/aunts  ? Asthma Neg Hx   ? Stomach cancer Neg Hx   ? Colon cancer Neg Hx   ? Esophageal cancer Neg Hx   ? Pancreatic cancer Neg Hx   ? ? ?Review of Systems  ?All other systems reviewed and are negative. ? ?Exam:   ?BP 110/62   Pulse 78   Ht '5\' 4"'$  (1.626 m)   Wt 174 lb (78.9 kg)   SpO2 100%   BMI 29.87 kg/m?   Weight change: '@WEIGHTCHANGE'$ @ Height:   Height: '5\' 4"'$  (162.6 cm)  ?Ht Readings from Last 3 Encounters:  ?07/15/21 '5\' 4"'$  (1.626 m)  ?06/25/21 '5\' 4"'$  (1.626 m)  ?06/19/21 '5\' 4"'$  (1.626 m)  ? ? ?General appearance: alert, cooperative and appears stated age ?Head: Normocephalic, without obvious abnormality, atraumatic ?Neck: no adenopathy, supple, symmetrical, trachea midline and thyroid normal to inspection and palpation ?Lungs: clear to auscultation bilaterally ?Cardiovascular: regular rate and rhythm ?Breasts: normal appearance, no masses or tenderness ?Abdomen: soft, non-tender; non distended,  no masses,  no organomegaly ?Extremities: extremities normal, atraumatic, no cyanosis or edema ?Skin: Skin color, texture, turgor normal. No rashes or lesions ?Lymph nodes: Cervical, supraclavicular, and axillary nodes normal. ?No abnormal inguinal nodes palpated ?Neurologic: Grossly normal ? ? ?Pelvic: External genitalia:  no lesions ?             Urethra:  normal appearing urethra with no masses, tenderness or lesions ?             Bartholins and Skenes: normal    ?             Vagina: normal appearing vagina with normal color and discharge, no lesions ?             Cervix: absent ?              ?Bimanual Exam:  Uterus:  uterus absent ?             Adnexa: no mass, fullness, tenderness ?              Rectovaginal: deferred  ? ?Gae Dry chaperoned for the exam. ? ?Pelvic ultrasound ? ?Indications: LLQ  abdominal pain ? ?Findings: ? ?Uterus absent  ? ?Left ovary 2.31 x 1.11 x 1.04 cm ? ?Right ovary 1.69 x 1.13 x 0.97 cm ? ?No free fluid ? ?Impression:  ?Absent uterus ?Normal atrophic appearing  ovaries bilaterally ? ? ?1. Well woman exam ?Discussed breast self exam ?Discussed calcium and vit D intake ?Mammogram overdue, she will schedule ?Colonoscopy UTD ?Labs with primary ? ?2. History of vaginal dysplasia ?- Cytology - PAP ? ?3. LLQ abdominal pain ?Normal gyn ultrasound. F/U with primary or GI ? ?

## 2021-07-15 NOTE — Patient Instructions (Signed)

## 2021-07-17 LAB — CYTOLOGY - PAP
Comment: NEGATIVE
High risk HPV: NEGATIVE

## 2021-07-21 ENCOUNTER — Other Ambulatory Visit: Payer: Self-pay

## 2021-07-21 DIAGNOSIS — R87612 Low grade squamous intraepithelial lesion on cytologic smear of cervix (LGSIL): Secondary | ICD-10-CM

## 2021-07-23 NOTE — Progress Notes (Signed)
?Regina [name] D.O. ?[facility] ?[place] ?Phone: [phone] ?Subjective:   ?I, Regina [name], am serving as a scribe for Dr. Hulan Saas. ? ?This visit occurred during the SARS-CoV-2 public health emergency.  Safety protocols were in place, including screening questions prior to the visit, additional usage of staff PPE, and extensive cleaning of exam room while observing appropriate contact time as indicated for disinfecting solutions.  ? ? ?I'm seeing this patient by the request  of:  Plotnikov, [name], MD ? ?CC: Low back pain follow-up ? ?IOE:[id number]  ?Regina [name] is a 56 y.o. female coming in with complaint of back and neck pain. OMT on 06/19/2021.  Patient states that she has been doing good since last visit.  ? ?Medications patient has been prescribed: None ? ?Taking: ? ? ?  ? ? ? ? ?Reviewed prior external information including notes and imaging from previsou exam, outside providers and external EMR if available.  ? ?As well as notes that were available from care everywhere and other healthcare systems. ? ?Past medical history, social, surgical and family history all reviewed in electronic medical record.  No pertanent information unless stated regarding to the chief complaint.  ? ?Past Medical History:  ?Diagnosis Date  ? Allergic urticaria   ? ARTHRALGIA   ? Arthritis   ? Atrial fibrillation ([place])   ? B12 DEFICIENCY   ? CFS (chronic fatigue syndrome)   ? Endometriosis   ? Epidural hemorrhage with loss of consciousness ([place]) 06/29/15  ? Fibroid   ? Fibromyalgia   ? Fracture of cervical vertebra, C6 ([place]) 06/29/15  ? Gastritis   ? GERD   ? Heart murmur   ? History of endometriosis   ? IBS (irritable bowel syndrome)   ? Internal hemorrhoids   ? MVP (mitral valve prolapse)   ? Osteoarthritis   ? Rosacea   ? Skin cancer of arm, right   ? Thoracic compression fracture ([place]) 06/29/15  ? T2-T6  ?  ?Allergies  ?Allergen Reactions  ? Indocin [Indomethacin] Swelling, Rash  and Other (See Comments)  ?  REACTION: lupus like illness; butterfly rash, pain and swelling all over body.   ? Morphine Sulfate Nausea And Vomiting  ? Sulfacetamide Sodium Swelling  ? Caffeine Other (See Comments)  ?  Induces A-fib  ? Morphine And Related   ?  nausea  ? Sulfa Antibiotics Swelling  ? Erythromycin Stearate Hives and Swelling  ?  CAN TAKE Z-PAK  ? ? ? ?Review of Systems: ? No headache, visual changes, nausea, vomiting, diarrhea, constipation, dizziness, abdominal pain, skin rash, fevers, chills, night sweats, weight loss, swollen lymph nodes, body aches, joint swelling, chest pain, shortness of breath, mood changes. POSITIVE muscle aches ? ?Objective  ?Blood pressure 100/72, pulse 69, height '5\' 4"'$  (1.626 m), weight 176 lb (79.8 kg), SpO2 98 %. ?  ?General: No apparent distress alert and oriented x3 mood and affect normal, dressed appropriately.  ?HEENT: Pupils equal, extraocular movements intact  ?Respiratory: Patient's speak in full sentences and does not appear short of breath  ?Cardiovascular: No lower extremity edema, non tender, no erythema low back exam does have some loss of lordosis.  Some tenderness to palpation of the paraspinal musculature.  Discussed icing regimen and home exercises, discussed which activities to do and which ones to avoid, increase activity slowly.  Severe tenderness to palpation over the greater trochanteric area right greater than left ?/ ?Osteopathic findings ? ? ?T7 extended  rotated and side bent left ?L1 flexed rotated and side bent right ?Sacrum right on right ? ?  ?Assessment and Plan: ? ?Low back pain ?Chronic problem with mild exacerbation again.  This is secondary to patient not having time for herself still.  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  Discussed icing regimen and home exercise, which activities to do which ones to avoid, increase activity slowly.  Follow-up again in 6 weeks ?Continue social determinants of health including taking care of mom with  no time.  ? ?Nonallopathic problems ? ?Decision today to treat with OMT was based on Physical Exam ? ?After verbal consent patient was treated with HVLA, ME, FPR techniques in thoracic, lumbar, and sacral  areas ? ?Patient tolerated the procedure well with improvement in symptoms ? ?Patient given exercises, stretches and lifestyle modifications ? ?See medications in patient instructions if given ? ?Patient will follow up in 4-8 weeks ? ?  ? ? ?The above documentation has been reviewed and is accurate and complete Lyndal Pulley, DO ? ? ? ?  ? ? Note: This dictation was prepared with Dragon dictation along with smaller phrase technology. Any transcriptional errors that result from this process are unintentional.    ?  ?  ? ?

## 2021-07-24 ENCOUNTER — Ambulatory Visit (INDEPENDENT_AMBULATORY_CARE_PROVIDER_SITE_OTHER): Payer: BC Managed Care – PPO | Admitting: Family Medicine

## 2021-07-24 VITALS — BP 100/72 | HR 69 | Ht 64.0 in | Wt 176.0 lb

## 2021-07-24 DIAGNOSIS — M9904 Segmental and somatic dysfunction of sacral region: Secondary | ICD-10-CM

## 2021-07-24 DIAGNOSIS — M9903 Segmental and somatic dysfunction of lumbar region: Secondary | ICD-10-CM

## 2021-07-24 DIAGNOSIS — M9902 Segmental and somatic dysfunction of thoracic region: Secondary | ICD-10-CM

## 2021-07-24 DIAGNOSIS — G8929 Other chronic pain: Secondary | ICD-10-CM

## 2021-07-24 DIAGNOSIS — M5441 Lumbago with sciatica, right side: Secondary | ICD-10-CM

## 2021-07-24 NOTE — Assessment & Plan Note (Signed)
Chronic problem with mild exacerbation again.  This is secondary to patient not having time for herself still.  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  Discussed icing regimen and home exercise, which activities to do which ones to avoid, increase activity slowly.  Follow-up again in 6 weeks ?Continue social determinants of health including taking care of mom with no time. ?

## 2021-07-24 NOTE — Patient Instructions (Signed)
Wart remover cream and pumice stone to foot for one week ?Take time for yourself ?See me in 8 weeks ?

## 2021-08-11 ENCOUNTER — Telehealth: Payer: Self-pay

## 2021-08-11 NOTE — Telephone Encounter (Signed)
Patient was taken out by her dog running full force at her and sweeping her legs. Patient hit the ground with her chest and now when she takes a deep breath she feels pain. Patient was trying to get in at a 745 slot but all was full, patient is unable to do any afternoon visits and is wondering if there was anyway to get a x ray ordered so she can make sure she didn't break a rib. Patient would like a call back to discuss any options that would be available to her  ?

## 2021-08-12 ENCOUNTER — Other Ambulatory Visit: Payer: Self-pay

## 2021-08-12 DIAGNOSIS — R071 Chest pain on breathing: Secondary | ICD-10-CM

## 2021-08-12 DIAGNOSIS — R0781 Pleurodynia: Secondary | ICD-10-CM

## 2021-08-12 NOTE — Telephone Encounter (Signed)
Xray ordered. Patient notified.  ?

## 2021-08-14 ENCOUNTER — Ambulatory Visit (INDEPENDENT_AMBULATORY_CARE_PROVIDER_SITE_OTHER): Payer: BC Managed Care – PPO

## 2021-08-14 DIAGNOSIS — R0781 Pleurodynia: Secondary | ICD-10-CM | POA: Diagnosis not present

## 2021-08-14 DIAGNOSIS — R071 Chest pain on breathing: Secondary | ICD-10-CM | POA: Diagnosis not present

## 2021-08-14 NOTE — Telephone Encounter (Signed)
Spoke with pt, unable to get in her MyChart. Advd xray orders in, will come asap.

## 2021-08-21 ENCOUNTER — Ambulatory Visit: Payer: BC Managed Care – PPO | Admitting: Family Medicine

## 2021-08-21 ENCOUNTER — Ambulatory Visit (INDEPENDENT_AMBULATORY_CARE_PROVIDER_SITE_OTHER): Payer: BC Managed Care – PPO | Admitting: Internal Medicine

## 2021-08-21 ENCOUNTER — Encounter: Payer: Self-pay | Admitting: Internal Medicine

## 2021-08-21 VITALS — BP 126/74 | HR 84 | Temp 98.8°F | Ht 64.0 in | Wt 170.0 lb

## 2021-08-21 DIAGNOSIS — E538 Deficiency of other specified B group vitamins: Secondary | ICD-10-CM | POA: Diagnosis not present

## 2021-08-21 DIAGNOSIS — J019 Acute sinusitis, unspecified: Secondary | ICD-10-CM | POA: Diagnosis not present

## 2021-08-21 DIAGNOSIS — J329 Chronic sinusitis, unspecified: Secondary | ICD-10-CM | POA: Insufficient documentation

## 2021-08-21 DIAGNOSIS — R5382 Chronic fatigue, unspecified: Secondary | ICD-10-CM | POA: Diagnosis not present

## 2021-08-21 MED ORDER — DOXYCYCLINE HYCLATE 100 MG PO TABS: 100.0000 mg | ORAL_TABLET | Freq: Two times a day (BID) | ORAL | 0 refills | Status: DC

## 2021-08-21 NOTE — Assessment & Plan Note (Signed)
Mild to mod, for antibx course,  to f/u any worsening symptoms or concerns 

## 2021-08-21 NOTE — Assessment & Plan Note (Signed)
Lab Results  Component Value Date   [id number] 638 07/31/2020   Stable, cont oral replacement - b12 1000 mcg qd

## 2021-08-21 NOTE — Progress Notes (Signed)
Patient ID: Regina [name], female   DOB: 1965-10-30, 56 y.o.   MRN: [id number]        Chief Complaint: follow up with acute sinus symptoms       HPI:  Regina [name] is a 56 y.o. female  Here with 3 days acute onset fever, facial pain, pressure, headache, general weakness and malaise, and greenish d/c, with mild ST and cough, but pt denies chest pain, wheezing, increased sob or doe, orthopnea, PND, increased LE swelling, palpitations, dizziness or syncope.   Pt denies polydipsia, polyuria, or new focal neuro s/s.   Pt denies recent wt loss, night sweats, loss of appetite, or other constitutional symptoms.  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  Does c/o ongoing fatigue, but denies signficant daytime hypersomnolence.  Taking B12 Wt Readings from Last 3 Encounters:  08/21/21 170 lb (77.1 kg)  07/24/21 176 lb (79.8 kg)  07/15/21 174 lb (78.9 kg)   BP Readings from Last 3 Encounters:  08/21/21 126/74  07/24/21 100/72  07/15/21 110/62         Past Medical History:  Diagnosis Date   Allergic urticaria    ARTHRALGIA    Arthritis    Atrial fibrillation ([facility])    B12 DEFICIENCY    CFS (chronic fatigue syndrome)    Endometriosis    Epidural hemorrhage with loss of consciousness ([place]) 06/29/15   Fibroid    Fibromyalgia    Fracture of cervical vertebra, C6 ([facility]) 06/29/15   Gastritis    GERD    Heart murmur    History of endometriosis    IBS (irritable bowel syndrome)    Internal hemorrhoids    MVP (mitral valve prolapse)    Osteoarthritis    Rosacea    Skin cancer of arm, right    Thoracic compression fracture ([place]) 06/29/15   T2-T6   Past Surgical History:  Procedure Laterality Date   ABDOMINAL HYSTERECTOMY     partial   ABDOMINAL HYSTERECTOMY     ANAL FISSURE REPAIR     Dr Weatherly 2009   APPENDECTOMY     COLPOSCOPY     HEMORRHOID SURGERY     Dr. Rise [name] 2009   OVARIAN CYST REMOVAL     RT BUNIONECTOMT     TONSILLECTOMY AND ADENOIDECTOMY     TUBAL  LIGATION     UTERINE FIBROID SURGERY      reports that she quit smoking about 32 years ago. Her smoking use included cigarettes. She has never used smokeless tobacco. She reports current alcohol use of about 1.0 - 2.0 standard drink per week. She reports that she does not use drugs. family history includes Breast cancer in her paternal aunt; Cancer in her mother; Diabetes in an other family member; Healthy in her father; Lung cancer in her mother; Thyroid cancer in her mother; Von Willebrand disease in her daughter. Allergies  Allergen Reactions   Indocin [Indomethacin] Swelling, Rash and Other (See Comments)    REACTION: lupus like illness; butterfly rash, pain and swelling all over body.    Morphine Sulfate Nausea And Vomiting   Sulfacetamide Sodium Swelling   Caffeine Other (See Comments)    Induces A-fib   Morphine And Related     nausea   Sulfa Antibiotics Swelling   Erythromycin Stearate Hives and Swelling    CAN TAKE Z-PAK   Current Outpatient Medications on File Prior to Visit  Medication Sig Dispense Refill   Ascorbic Acid (VITAMIN C  PO) Take by mouth.     Cholecalciferol (VITAMIN D3) 125 MCG (5000 UT) TABS Take 10,000 Units by mouth daily.     cyanocobalamin (,VITAMIN B-12,) 1000 MCG/ML injection Inject into the muscle.     famotidine (PEPCID) 20 MG tablet Take 1 tablet (20 mg total) by mouth at bedtime. 30 tablet 3   loratadine (CLARITIN) 10 MG tablet Take 1 tablet (10 mg total) by mouth daily. 90 tablet 5   Magnesium 400 MG TABS Take 1 tablet by mouth daily.     Omega-3 Fatty Acids (FISH OIL PO) Take by mouth.     pantoprazole (PROTONIX) 40 MG tablet Take 1 tablet (40 mg total) by mouth daily. 90 tablet 0   Probiotic Product (PROBIOTIC PO) Take by mouth.     SYRINGE-NEEDLE, DISP, 3 ML (BD ECLIPSE SYRINGE) 25G X 1" 3 ML MISC For SQ Vit B12 shots 50 each 3   Turmeric 500 MG TABS Take by mouth 2 (two) times daily.     UNABLE TO FIND Immunity booster     VITAMIN K PO Take 1  tablet by mouth every other day.     ZINC GLUCONATE PO Take 1 tablet by mouth daily.     No current facility-administered medications on file prior to visit.        ROS:  All others reviewed and negative.  Objective        PE:  BP 126/74 (BP Location: Right Arm, Patient Position: Sitting, Cuff Size: Large)   Pulse 84   Temp 98.8 F (37.1 C) (Oral)   Ht '5\' 4"'$  (1.626 m)   Wt 170 lb (77.1 kg)   SpO2 94%   BMI 29.18 kg/m                 Constitutional: Pt appears in NAD               HENT: Head: NCAT.                Right Ear: External ear normal.                 Left Ear: External ear normal.   Bilat tm's with mild erythema.  Max sinus areas mild tender.  Pharynx with mild erythema, no exudate               Eyes: . Pupils are equal, round, and reactive to light. Conjunctivae and EOM are normal               Nose: without d/c or deformity               Neck: Neck supple. Gross normal ROM               Cardiovascular: Normal rate and regular rhythm.                 Pulmonary/Chest: Effort normal and breath sounds without rales or wheezing.                              Neurological: Pt is alert. At baseline orientation, motor grossly intact               Skin: Skin is warm. No rashes, no other new lesions, LE edema -  none               Psychiatric: Pt behavior is normal without agitation   Micro: none  Cardiac tracings I have personally interpreted today:  none  Pertinent Radiological findings (summarize): none   Lab Results  Component Value Date   WBC 5.3 09/03/2020   HGB 12.8 09/03/2020   HCT 37.5 09/03/2020   PLT 326.0 09/03/2020   GLUCOSE 80 09/03/2020   CHOL 203 (H) 07/31/2020   TRIG 126.0 07/31/2020   HDL 54.70 07/31/2020   LDLCALC 123 (H) 07/31/2020   ALT 14 09/03/2020   AST 16 09/03/2020   NA 141 09/03/2020   K 3.7 09/03/2020   CL 103 09/03/2020   CREATININE 0.72 09/03/2020   BUN 15 09/03/2020   CO2 27 09/03/2020   TSH 1.08 09/03/2020   INR 1.16  06/29/2015   HGBA1C 5.4 08/23/2018   Assessment/Plan:  Regina [name] is a 56 y.o. White or Caucasian [1] female with  has a past medical history of Allergic urticaria, ARTHRALGIA, Arthritis, Atrial fibrillation ([facility]), B12 DEFICIENCY, CFS (chronic fatigue syndrome), Endometriosis, Epidural hemorrhage with loss of consciousness ([place]) (06/29/15), Fibroid, Fibromyalgia, Fracture of cervical vertebra, C6 ([place]) (06/29/15), Gastritis, GERD, Heart murmur, History of endometriosis, IBS (irritable bowel syndrome), Internal hemorrhoids, MVP (mitral valve prolapse), Osteoarthritis, Rosacea, Skin cancer of arm, right, and Thoracic compression fracture ([place]) (06/29/15).  Sinusitis Mild to mod, for antibx course,  to f/u any worsening symptoms or concerns  Fatigue D/w pt - ok for work note as done,  to f/u any worsening symptoms or concerns  B12 deficiency Lab Results  Component Value Date   VITAMINB12 506 07/31/2020   Stable, cont oral replacement - b12 1000 mcg qd  Followup: Return if symptoms worsen or fail to improve.  Cathlean Cower, MD 08/21/2021 2:13 PM [facility]

## 2021-08-21 NOTE — Patient Instructions (Signed)
Please take all new medication as prescribed - the antibiotic  You are given the work note today  Please continue all other medications as before, and refills have been done if requested.  Please have the pharmacy call with any other refills you may need.  Please keep your appointments with your specialists as you may have planned

## 2021-08-21 NOTE — Assessment & Plan Note (Signed)
D/w pt - ok for work note as done,  to f/u any worsening symptoms or concerns

## 2021-09-06 ENCOUNTER — Other Ambulatory Visit: Payer: Self-pay | Admitting: Gastroenterology

## 2021-09-06 DIAGNOSIS — K219 Gastro-esophageal reflux disease without esophagitis: Secondary | ICD-10-CM

## 2021-09-11 ENCOUNTER — Ambulatory Visit: Payer: BC Managed Care – PPO | Admitting: Family Medicine

## 2021-09-11 ENCOUNTER — Ambulatory Visit: Payer: BC Managed Care – PPO | Admitting: Obstetrics and Gynecology

## 2021-09-15 ENCOUNTER — Ambulatory Visit (INDEPENDENT_AMBULATORY_CARE_PROVIDER_SITE_OTHER): Payer: BC Managed Care – PPO

## 2021-09-15 ENCOUNTER — Ambulatory Visit: Payer: BC Managed Care – PPO

## 2021-09-15 ENCOUNTER — Ambulatory Visit (INDEPENDENT_AMBULATORY_CARE_PROVIDER_SITE_OTHER): Payer: BC Managed Care – PPO | Admitting: Podiatry

## 2021-09-15 DIAGNOSIS — M7741 Metatarsalgia, right foot: Secondary | ICD-10-CM

## 2021-09-15 DIAGNOSIS — L989 Disorder of the skin and subcutaneous tissue, unspecified: Secondary | ICD-10-CM | POA: Diagnosis not present

## 2021-09-15 NOTE — Progress Notes (Signed)
HPI: 56 y.o. female presenting today for follow-up evaluation of symptomatic callus to the plantar aspect of the third MTP right foot.  She does have history of right foot surgery back in 2010.  Patient states that she has been developing symptomatic calluses and pain to the right forefoot.  She was last seen in the office on 05/07/2021 at that time they were debrided.  She said they did feel better temporarily.  Currently she has been applying some salicylic acid to the area over the past few weeks.  Past Medical History:  Diagnosis Date   Allergic urticaria    ARTHRALGIA    Arthritis    Atrial fibrillation ([facility])    B12 DEFICIENCY    CFS (chronic fatigue syndrome)    Endometriosis    Epidural hemorrhage with loss of consciousness ([place]) 06/29/15   Fibroid    Fibromyalgia    Fracture of cervical vertebra, C6 ([facility]) 06/29/15   Gastritis    GERD    Heart murmur    History of endometriosis    IBS (irritable bowel syndrome)    Internal hemorrhoids    MVP (mitral valve prolapse)    Osteoarthritis    Rosacea    Skin cancer of arm, right    Thoracic compression fracture ([place]) 06/29/15   T2-T6    Past Surgical History:  Procedure Laterality Date   ABDOMINAL HYSTERECTOMY     partial   ABDOMINAL HYSTERECTOMY     ANAL FISSURE REPAIR     Dr Rise [name] 2009   APPENDECTOMY     COLPOSCOPY     HEMORRHOID SURGERY     Dr. Rise [name] 2009   OVARIAN CYST REMOVAL     RT BUNIONECTOMT     TONSILLECTOMY AND ADENOIDECTOMY     TUBAL LIGATION     UTERINE FIBROID SURGERY      Allergies  Allergen Reactions   Indocin [Indomethacin] Swelling, Rash and Other (See Comments)    REACTION: lupus like illness; butterfly rash, pain and swelling all over body.    Morphine Sulfate Nausea And Vomiting   Sulfacetamide Sodium Swelling   Caffeine Other (See Comments)    Induces A-fib   Morphine And Related     nausea   Sulfa Antibiotics Swelling   Erythromycin Stearate Hives and Swelling    CAN TAKE Z-PAK      Physical Exam: General: The patient is alert and oriented x3 in no acute distress.  Dermatology: Skin is warm, dry and supple bilateral lower extremities.  Hyperkeratotic callus noted to the plantar aspect of the third MTP joint with a central nucleated core Vascular: Palpable pedal pulses bilaterally. Capillary refill within normal limits.  Negative for any significant edema or erythema  Neurological: Light touch and protective threshold grossly intact  Musculoskeletal Exam: There continues to be some pain on palpation along the second and third metatarsal heads consistent with radiographic findings and chronic metatarsalgia.  There is also pain on palpation to the calluses noted to the bilateral feet  Radiographic Exam 09/15/2021 RT foot:  Normal osseous mineralization.  Orthopedic screw noted to the distal portion of the fourth metatarsal which appears intact and stable as well as K wire fixation to the first metatarsal.  The second and third metatarsals extend beyond the metatarsal parabola of the foot which is likely causing the pressure calluses.  Skin marker was placed over the pressure callus of the foot which directly correlates to third metatarsal head.  Possible history of chronic stress reaction fractures  along the second metatarsal also noted.  No acute fracture identified at the moment  Assessment: 1.  Porokeratosis plantar third MTP RT foot 2.  Elongated metatarsals second and third right 3. H/o bunionectomy and fourth metatarsal osteotomy surgery RT. 2010 4.  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 Patient evaluated. X-Rays reviewed.  2.  Excisional debridement of the hyperkeratotic preulcerative callus tissue was performed using 312 scalpel without incident or bleeding 3.  Recommend salicylic acid daily under occlusion with a Band-Aid 4.  Return to clinic 4 weeks   Edrick Kins,  DPM [facility]  Dr. Edrick Kins, DPM    [address], [place] [address]                Office [phone]  Fax [phone]

## 2021-09-18 ENCOUNTER — Ambulatory Visit: Payer: BC Managed Care – PPO | Admitting: Internal Medicine

## 2021-09-18 ENCOUNTER — Encounter: Payer: Self-pay | Admitting: Internal Medicine

## 2021-09-18 DIAGNOSIS — J4520 Mild intermittent asthma, uncomplicated: Secondary | ICD-10-CM | POA: Diagnosis not present

## 2021-09-18 DIAGNOSIS — J301 Allergic rhinitis due to pollen: Secondary | ICD-10-CM | POA: Diagnosis not present

## 2021-09-18 DIAGNOSIS — J309 Allergic rhinitis, unspecified: Secondary | ICD-10-CM | POA: Insufficient documentation

## 2021-09-18 NOTE — Assessment & Plan Note (Signed)
  Regina [name] declined Loratidine, flonase Given nasal rinse to use

## 2021-09-18 NOTE — Assessment & Plan Note (Signed)
  Regina [name] declined Loratidine, flonase, MDI Given nasal rinse to use

## 2021-09-18 NOTE — Progress Notes (Signed)
Subjective:  Patient ID: Regina [name], female    DOB: 1965/11/04  Age: 56 y.o. MRN: [id number]  CC: No chief complaint on file.   HPI Regina [name] presents for ST and sinusitis, Pt saw Dr Jenny Reichmann on 08/21/21 - given Doxy. Better C/o wheezing, L nose closed up in am   Outpatient Medications Prior to Visit  Medication Sig Dispense Refill   Ascorbic Acid (VITAMIN C PO) Take by mouth.     Cholecalciferol (VITAMIN D3) 125 MCG (5000 UT) TABS Take 10,000 Units by mouth daily.     cyanocobalamin (,VITAMIN B-12,) 1000 MCG/ML injection Inject into the muscle.     doxycycline (VIBRA-TABS) 100 MG tablet Take 1 tablet (100 mg total) by mouth 2 (two) times daily. 20 tablet 0   famotidine (PEPCID) 20 MG tablet Take 1 tablet (20 mg total) by mouth at bedtime. 30 tablet 3   loratadine (CLARITIN) 10 MG tablet Take 1 tablet (10 mg total) by mouth daily. 90 tablet 5   Magnesium 400 MG TABS Take 1 tablet by mouth daily.     Omega-3 Fatty Acids (FISH OIL PO) Take by mouth.     pantoprazole (PROTONIX) 40 MG tablet TAKE 1 TABLET BY MOUTH EVERY DAY 90 tablet 0   Probiotic Product (PROBIOTIC PO) Take by mouth.     SYRINGE-NEEDLE, DISP, 3 ML (BD ECLIPSE SYRINGE) 25G X 1" 3 ML MISC For SQ Vit B12 shots 50 each 3   Turmeric 500 MG TABS Take by mouth 2 (two) times daily.     UNABLE TO FIND Immunity booster     VITAMIN K PO Take 1 tablet by mouth every other day.     ZINC GLUCONATE PO Take 1 tablet by mouth daily.     No facility-administered medications prior to visit.    ROS: Review of Systems  Constitutional:  Negative for activity change, appetite change, chills, fatigue and unexpected weight change.  HENT:  Positive for congestion, postnasal drip and rhinorrhea. Negative for mouth sores and sinus pressure.   Eyes:  Negative for visual disturbance.  Respiratory:  Negative for cough and chest tightness.   Gastrointestinal:  Negative for abdominal pain and nausea.  Genitourinary:  Negative for  difficulty urinating, frequency and vaginal pain.  Musculoskeletal:  Negative for back pain and gait problem.  Skin:  Negative for pallor and rash.  Neurological:  Negative for dizziness, tremors, weakness, numbness and headaches.  Psychiatric/Behavioral:  Negative for confusion and sleep disturbance.     Objective:  BP 122/80 (BP Location: Left Arm, Patient Position: Sitting, Cuff Size: Normal)   Pulse 66   Temp 98.2 F (36.8 C) (Oral)   Ht '5\' 4"'$  (1.626 m)   Wt 173 lb (78.5 kg)   SpO2 99%   BMI 29.70 kg/m   BP Readings from Last 3 Encounters:  09/18/21 122/80  08/21/21 126/74  07/24/21 100/72    Wt Readings from Last 3 Encounters:  09/18/21 173 lb (78.5 kg)  08/21/21 170 lb (77.1 kg)  07/24/21 176 lb (79.8 kg)    Physical Exam Constitutional:      General: She is not in acute distress.    Appearance: Normal appearance. She is well-developed.  HENT:     Head: Normocephalic.     Right Ear: External ear normal. There is no impacted cerumen.     Left Ear: External ear normal. There is no impacted cerumen.     Nose: Congestion and rhinorrhea present.  Eyes:  General:        Right eye: No discharge.        Left eye: No discharge.     Conjunctiva/sclera: Conjunctivae normal.     Pupils: Pupils are equal, round, and reactive to light.  Neck:     Thyroid: No thyromegaly.     Vascular: No JVD.     Trachea: No tracheal deviation.  Cardiovascular:     Rate and Rhythm: Normal rate and regular rhythm.     Heart sounds: Normal heart sounds.  Pulmonary:     Effort: No respiratory distress.     Breath sounds: No stridor. No wheezing or rhonchi.  Abdominal:     General: Bowel sounds are normal. There is no distension.     Palpations: Abdomen is soft. There is no mass.     Tenderness: There is no abdominal tenderness. There is no guarding or rebound.  Musculoskeletal:        General: No tenderness.     Cervical back: Normal range of motion and neck supple. No rigidity.   Lymphadenopathy:     Cervical: No cervical adenopathy.  Skin:    Findings: No erythema or rash.  Neurological:     Cranial Nerves: No cranial nerve deficit.     Motor: No abnormal muscle tone.     Coordination: Coordination normal.     Deep Tendon Reflexes: Reflexes normal.  Psychiatric:        Behavior: Behavior normal.        Thought Content: Thought content normal.        Judgment: Judgment normal.     Lab Results  Component Value Date   WBC 5.3 09/03/2020   HGB 12.8 09/03/2020   HCT 37.5 09/03/2020   PLT 326.0 09/03/2020   GLUCOSE 80 09/03/2020   CHOL 203 (H) 07/31/2020   TRIG 126.0 07/31/2020   HDL 54.70 07/31/2020   LDLCALC 123 (H) 07/31/2020   ALT 14 09/03/2020   AST 16 09/03/2020   NA 141 09/03/2020   K 3.7 09/03/2020   CL 103 09/03/2020   CREATININE 0.72 09/03/2020   BUN 15 09/03/2020   CO2 27 09/03/2020   TSH 1.08 09/03/2020   INR 1.16 06/29/2015   HGBA1C 5.4 08/23/2018    US PELVIS TRANSVAGINAL NON-OB (TV ONLY)  Result Date: 07/25/2021 Pelvic ultrasound   Indications: LLQ abdominal pain   Findings:   Uterus absent   Left ovary 2.31 x 1.11 x 1.04 cm   Right ovary 1.69 x 1.13 x 0.97 cm   No free fluid   Impression: Absent uterus Normal atrophic appearing ovaries bilaterally     Assessment & Plan:   Problem List Items Addressed This Visit     Allergic rhinitis     Lucy declined Loratidine, flonase Given nasal rinse to use      Asthmatic bronchitis     Lucy declined Loratidine, flonase, MDI Given nasal rinse to use         No orders of the defined types were placed in this encounter.     Follow-up: No follow-ups on file.  Walker Kehr, MD

## 2021-09-22 ENCOUNTER — Encounter: Payer: Self-pay | Admitting: Obstetrics and Gynecology

## 2021-09-22 ENCOUNTER — Other Ambulatory Visit (HOSPITAL_COMMUNITY)
Admission: RE | Admit: 2021-09-22 | Discharge: 2021-09-22 | Disposition: A | Discharge status: Home / Self Care | Payer: BC Managed Care – PPO | Source: Ambulatory Visit | Attending: Obstetrics and Gynecology | Admitting: Obstetrics and Gynecology

## 2021-09-22 ENCOUNTER — Ambulatory Visit: Payer: BC Managed Care – PPO | Admitting: Podiatry

## 2021-09-22 ENCOUNTER — Ambulatory Visit (INDEPENDENT_AMBULATORY_CARE_PROVIDER_SITE_OTHER): Payer: BC Managed Care – PPO | Admitting: Obstetrics and Gynecology

## 2021-09-22 VITALS — BP 100/68

## 2021-09-22 DIAGNOSIS — R87622 Low grade squamous intraepithelial lesion on cytologic smear of vagina (LGSIL): Secondary | ICD-10-CM

## 2021-09-22 DIAGNOSIS — Z87411 Personal history of vaginal dysplasia: Secondary | ICD-10-CM

## 2021-09-24 LAB — SURGICAL PATHOLOGY

## 2021-10-09 ENCOUNTER — Other Ambulatory Visit: Payer: Self-pay | Admitting: Obstetrics and Gynecology

## 2021-10-09 DIAGNOSIS — Z1231 Encounter for screening mammogram for malignant neoplasm of breast: Secondary | ICD-10-CM

## 2021-10-10 ENCOUNTER — Ambulatory Visit: Payer: BC Managed Care – PPO

## 2021-10-15 ENCOUNTER — Ambulatory Visit: Payer: BC Managed Care – PPO | Admitting: Podiatry

## 2021-10-15 DIAGNOSIS — L989 Disorder of the skin and subcutaneous tissue, unspecified: Secondary | ICD-10-CM | POA: Diagnosis not present

## 2021-10-15 NOTE — Progress Notes (Signed)
Chief Complaint  Patient presents with   Follow-up    1 month follow-up.    HPI: 56 y.o. female presenting today for follow-up evaluation of a symptomatic callus to the plantar aspect of the third MTP joint right foot.  Patient states that she is feeling much better.  She did apply some salicylic acid over the past month which did help as well.  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 Rise [name] 2009   OVARIAN CYST REMOVAL     RT BUNIONECTOMT     TONSILLECTOMY AND ADENOIDECTOMY     TUBAL LIGATION     UTERINE FIBROID SURGERY      Allergies  Allergen Reactions   Indocin [Indomethacin] Swelling, Rash and Other (See Comments)    REACTION: lupus like illness; butterfly rash, pain and swelling all over body.    Morphine Sulfate Nausea And Vomiting   Sulfacetamide Sodium Swelling   Caffeine Other (See Comments)    Induces A-fib   Morphine And Related     nausea   Sulfa Antibiotics Swelling   Erythromycin Stearate Hives and Swelling    CAN TAKE Z-PAK     Physical Exam: General: The patient is alert and oriented x3 in no acute distress.  Dermatology:  There continues to be some slight hyperkeratotic tissue to the plantar aspect of the third MTP however there is no pain with palpation  Vascular: Palpable pedal pulses bilaterally. Capillary refill within normal limits.  Negative for any significant edema or erythema  Neurological: Light touch and protective threshold grossly intact  Musculoskeletal Exam: No pedal deformities noted  Assessment: 1.  Porokeratosis plantar third MTP right foot 2.  Elongated metatarsal second and third right   Plan of Care:  1. Patient evaluated.  2.  Light excisional debridement of the porokeratosis was performed using a 312 scalpel without incident or bleeding.  Patient did feel significant relief 3.  Continue wearing good supportive shoes and sneakers that support the medial longitudinal arch of the foot and offload pressure from the forefoot 4.  Return to clinic as needed     Edrick Kins, DPM [facility]  Dr. Edrick Kins, DPM    [address]  [place], [place] [address]                Office [phone]  Fax [phone]

## 2021-10-30 ENCOUNTER — Ambulatory Visit
Admission: RE | Admit: 2021-10-30 | Discharge: 2021-10-30 | Disposition: A | Discharge status: Home / Self Care | Payer: BC Managed Care – PPO | Source: Ambulatory Visit | Attending: Obstetrics and Gynecology | Admitting: Obstetrics and Gynecology

## 2021-10-30 DIAGNOSIS — Z1231 Encounter for screening mammogram for malignant neoplasm of breast: Secondary | ICD-10-CM

## 2021-11-03 ENCOUNTER — Encounter: Payer: Self-pay | Admitting: Obstetrics and Gynecology

## 2021-11-03 DIAGNOSIS — R928 Other abnormal and inconclusive findings on diagnostic imaging of breast: Secondary | ICD-10-CM

## 2021-11-03 NOTE — Telephone Encounter (Signed)
Just FYI.

## 2021-11-05 ENCOUNTER — Ambulatory Visit: Payer: BC Managed Care – PPO | Admitting: Podiatry

## 2021-11-11 ENCOUNTER — Other Ambulatory Visit: Payer: Self-pay | Admitting: Obstetrics and Gynecology

## 2021-11-11 ENCOUNTER — Ambulatory Visit
Admission: RE | Admit: 2021-11-11 | Discharge: 2021-11-11 | Disposition: A | Discharge status: Home / Self Care | Payer: BC Managed Care – PPO | Source: Ambulatory Visit | Attending: Obstetrics and Gynecology | Admitting: Obstetrics and Gynecology

## 2021-11-11 DIAGNOSIS — N6489 Other specified disorders of breast: Secondary | ICD-10-CM

## 2021-11-11 DIAGNOSIS — R928 Other abnormal and inconclusive findings on diagnostic imaging of breast: Secondary | ICD-10-CM

## 2021-11-20 ENCOUNTER — Ambulatory Visit
Admission: RE | Admit: 2021-11-20 | Discharge: 2021-11-20 | Disposition: A | Discharge status: Home / Self Care | Payer: BC Managed Care – PPO | Source: Ambulatory Visit | Attending: Obstetrics and Gynecology | Admitting: Obstetrics and Gynecology

## 2021-11-20 DIAGNOSIS — N6489 Other specified disorders of breast: Secondary | ICD-10-CM

## 2022-01-26 ENCOUNTER — Ambulatory Visit: Payer: BC Managed Care – PPO | Admitting: Podiatry

## 2022-02-23 ENCOUNTER — Encounter: Payer: Self-pay | Admitting: Internal Medicine

## 2022-02-23 ENCOUNTER — Ambulatory Visit (INDEPENDENT_AMBULATORY_CARE_PROVIDER_SITE_OTHER): Payer: BC Managed Care – PPO | Admitting: Internal Medicine

## 2022-02-23 VITALS — BP 104/68 | HR 63 | Temp 98.2°F | Ht 64.0 in | Wt 173.0 lb

## 2022-02-23 DIAGNOSIS — J301 Allergic rhinitis due to pollen: Secondary | ICD-10-CM

## 2022-02-23 DIAGNOSIS — J22 Unspecified acute lower respiratory infection: Secondary | ICD-10-CM | POA: Insufficient documentation

## 2022-02-23 DIAGNOSIS — R0981 Nasal congestion: Secondary | ICD-10-CM

## 2022-02-23 DIAGNOSIS — K219 Gastro-esophageal reflux disease without esophagitis: Secondary | ICD-10-CM

## 2022-02-23 DIAGNOSIS — J9600 Acute respiratory failure, unspecified whether with hypoxia or hypercapnia: Secondary | ICD-10-CM | POA: Insufficient documentation

## 2022-02-23 LAB — POC INFLUENZA A&B (BINAX/QUICKVUE)
Influenza A, POC: NEGATIVE
Influenza B, POC: NEGATIVE

## 2022-02-23 LAB — POC SOFIA SARS ANTIGEN FIA: SARS Coronavirus 2 Ag: NEGATIVE

## 2022-02-23 MED ORDER — TRIAMCINOLONE ACETONIDE 55 MCG/ACT NA AERO: 2.0000 | INHALATION_SPRAY | Freq: Every day | NASAL | 12 refills | Status: DC

## 2022-02-23 MED ORDER — AZITHROMYCIN 250 MG PO TABS: ORAL_TABLET | ORAL | 1 refills | Status: AC

## 2022-02-23 NOTE — Progress Notes (Signed)
Patient ID: GENIFER LAZENBY, female   DOB: 12-11-1965, 55 y.o.   MRN: [id number]        Chief Complaint: follow up Samuel Germany symptoms, allergies, gerd       HPI:  MARCIANA UPLINGER is a 56 y.o. female  Here with 4-5 days acute onset fever, facial pain, pressure, headache, general weakness and malaise, and greenish d/c, with mild ST and cough, but pt denies chest pain, wheezing, increased sob or doe, orthopnea, PND, increased LE swelling, palpitations, dizziness or syncope.  Does have several wks ongoing nasal allergy symptoms with clearish congestion, itch and sneezing, without fever, pain, ST, cough, swelling or wheezing.  Denies worsening reflux, abd pain, dysphagia, n/v, bowel change or blood.       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 Rise [name] 2009   OVARIAN CYST REMOVAL     RT BUNIONECTOMT     TONSILLECTOMY AND ADENOIDECTOMY     TUBAL LIGATION     UTERINE FIBROID SURGERY      reports that she  quit smoking about 33 years ago. Her smoking use included cigarettes. She has never used smokeless tobacco. She reports current alcohol use of about 1.0 - 2.0 standard drink of alcohol per week. She reports that she does not use drugs. family history includes Breast cancer in her paternal aunt; Cancer in her mother; Diabetes in an other family member; Healthy in her father; Lung cancer in her mother; Thyroid cancer in her mother; Von Willebrand disease in her daughter. Allergies  Allergen Reactions   Indocin [Indomethacin] Swelling, Rash and Other (See Comments)    REACTION: lupus like illness; butterfly rash, pain and swelling all over body.    Morphine Sulfate Nausea And Vomiting   Sulfacetamide Sodium Swelling   Caffeine Other (See Comments)    Induces A-fib   Morphine And Related     nausea   Sulfa Antibiotics Swelling   Erythromycin Stearate Hives and Swelling    CAN TAKE Z-PAK   Current Outpatient Medications on File Prior to Visit  Medication Sig Dispense Refill   Ascorbic Acid (VITAMIN C PO) Take by mouth.     Cholecalciferol (  VITAMIN D3) 125 MCG (5000 UT) TABS Take 10,000 Units by mouth daily.     cyanocobalamin (,VITAMIN B-12,) 1000 MCG/ML injection Inject into the muscle.     loratadine (CLARITIN) 10 MG tablet Take 1 tablet (10 mg total) by mouth daily. 90 tablet 5   Magnesium 400 MG TABS Take 1 tablet by mouth daily.     Omega-3 Fatty Acids (FISH OIL PO) Take by mouth.     pantoprazole (PROTONIX) 40 MG tablet TAKE 1 TABLET BY MOUTH EVERY DAY 90 tablet 0   Probiotic Product (PROBIOTIC PO) Take by mouth.     SYRINGE-NEEDLE, DISP, 3 ML (BD ECLIPSE SYRINGE) 25G X 1" 3 ML MISC For SQ Vit B12 shots 50 each 3   Turmeric 500 MG TABS Take by mouth 2 (two) times daily.     UNABLE TO FIND Immunity booster     VITAMIN K PO Take 1 tablet by mouth every other day.     ZINC GLUCONATE PO Take 1 tablet by mouth daily.     No current facility-administered medications on file prior to visit.         ROS:  All others reviewed and negative.  Objective        PE:  BP 104/68 (BP Location: Right Arm, Patient Position: Sitting, Cuff Size: Large)   Pulse 63   Temp 98.2 F (36.8 C) (Oral)   Ht '5\' 4"'$  (1.626 m)   Wt 173 lb (78.5 kg)   SpO2 95%   BMI 29.70 kg/m                 Constitutional: Pt appears in NAD               HENT: Head: NCAT.                Right Ear: External ear normal.                 Left Ear: External ear normal.  Bilat tm's with mild erythema.  Max sinus areas mild tender.  Pharynx with mild erythema, no exudate               Eyes: . Pupils are equal, round, and reactive to light. Conjunctivae and EOM are normal               Nose: without d/c or deformity               Neck: Neck supple. Gross normal ROM               Cardiovascular: Normal rate and regular rhythm.                 Pulmonary/Chest: Effort normal and breath sounds without rales or wheezing.                               Neurological: Pt is alert. At baseline orientation, motor grossly intact               Skin: Skin is warm. No rashes, no other new lesions, LE edema - none               Psychiatric: Pt behavior is normal without agitation   Micro: none  Cardiac tracings I have personally interpreted today:  none  Pertinent Radiological findings (summarize): none   Lab Results  Component Value Date   WBC 5.3 09/03/2020   HGB 12.8  09/03/2020   HCT 37.5 09/03/2020   PLT 326.0 09/03/2020   GLUCOSE 80 09/03/2020   CHOL 203 (H) 07/31/2020   TRIG 126.0 07/31/2020   HDL 54.70 07/31/2020   LDLCALC 123 (H) 07/31/2020   ALT 14 09/03/2020   AST 16 09/03/2020   NA 141 09/03/2020   K 3.7 09/03/2020   CL 103 09/03/2020   CREATININE 0.72 09/03/2020   BUN 15 09/03/2020   CO2 27 09/03/2020   TSH 1.08 09/03/2020   INR 1.16 06/29/2015   HGBA1C 5.4 08/23/2018   POCT - COVID neg, Flu A/B - neg  Assessment/Plan:  ATTICUS LEMBERGER is a 56 y.o. White or Caucasian [1] female with  has a past  medical history of Allergic urticaria, ARTHRALGIA, Arthritis, Atrial fibrillation (HCC), B12 DEFICIENCY, CFS (chronic fatigue syndrome), Endometriosis, Epidural hemorrhage with loss of consciousness ([place]) (06/29/15), Fibroid, Fibromyalgia, Fracture of cervical vertebra, C6 ([place]) (06/29/15), Gastritis, GERD, Heart murmur, History of endometriosis, IBS (irritable bowel syndrome), Internal hemorrhoids, MVP (mitral valve prolapse), Osteoarthritis, Rosacea, Skin cancer of arm, right, and Thoracic compression fracture ([place]) (06/29/15).  Acute respiratory infection Mild to mod, for antibx course,  to f/u any worsening symptoms or concerns  Allergic rhinitis Mild to mod flare, for prednisone taper asd,,  to f/u any worsening symptoms or concerns   Gastro-esophageal reflux disease without esophagitis Stable, cont protonix 40 mg  Followup: Return if symptoms worsen or fail to improve.  Cathlean Cower, MD 02/23/2022 1:00 PM [facility]

## 2022-02-23 NOTE — Assessment & Plan Note (Addendum)
Stable, cont protonix 40 mg

## 2022-02-23 NOTE — Patient Instructions (Addendum)
Your COVID and Flu testing was negative  Please take all new medication as prescribed - the antibiotic, and Nasacort for nasal congestion  Please continue all other medications as before, and refills have been done if requested.  Please have the pharmacy call with any other refills you may need.  Please keep your appointments with your specialists as you may have planned

## 2022-02-23 NOTE — Assessment & Plan Note (Signed)
Mild to mod, for antibx course,  to f/u any worsening symptoms or concerns 

## 2022-02-23 NOTE — Assessment & Plan Note (Signed)
Mild to mod flare, for prednisone taper asd,,  to f/u any worsening symptoms or concerns

## 2022-02-24 ENCOUNTER — Ambulatory Visit: Payer: Self-pay

## 2022-02-24 ENCOUNTER — Ambulatory Visit: Payer: BC Managed Care – PPO | Admitting: Family Medicine

## 2022-02-24 VITALS — BP 110/60 | HR 62 | Ht 64.0 in | Wt 173.0 lb

## 2022-02-24 DIAGNOSIS — M25561 Pain in right knee: Secondary | ICD-10-CM | POA: Diagnosis not present

## 2022-02-24 NOTE — Patient Instructions (Addendum)
Thank you for coming in today.   The Ask[place] L Protocol for Hamstring Strains  Lyman Bishop PT;  Please get labs today before you leave   I can prescribe prednisone and gabapentin if needed if your pain is really bad.   Keep me updated.

## 2022-02-24 NOTE — Progress Notes (Unsigned)
Rito Ehrlich, am serving as a Education administrator for Dr. Hulan Saas.  Regina [name] is a 56 y.o. female who presents to [place] at [facility] today for R thigh pain. Pt was previously seen by Dr. Tamala Julian on 07/24/21 for OMT. Today, pt reports R thigh pain x2 weeks. Pt locates pain to behind right knee that radiates up her thigh to her groin and sometimes to her calf.  Patient states that she fell a couple years ago and tore her hamstring and sometimes she will get a bakers cyst behind her knee. Patient  states that she does not feel any heat or see swelling. Worse when driving but the pain has started to become more constant. Patient is not sure if it is bakers cyst again, blood clot, or somehtng to do with the old hamstring injury. Patient feels like her leg is really heavy and states yesterday and today was winded going up and down the steps and that is not normal for her.    Pertinent review of systems: No fevers or chills  Relevant historical information: IBS.  History of superficial thrombophlebitis.   Exam:  BP 110/60   Pulse 62   Ht '5\' 4"'$  (1.626 m)   Wt 173 lb (78.5 kg)   SpO2 97%   BMI 29.70 kg/m  General: Well Developed, well nourished, and in no acute distress.   MSK: Right thigh posterior thigh: Normal-appearing Nontender. Normal hip and knee motion. Negative slump test. Some pain reproduced with resisted knee flexion. Negative slump test. Reflexes are intact.  Strength is intact distally. Pulses cap refill and sensation are intact distally.    Lab and Radiology Results  Diagnostic Limited MSK Ultrasound of: Right knee Quad tendon normal-appearing Small joint effusion present. Patellar tendon normal. Medial and lateral joint line mild degenerative appearing Posterior knee no Baker's cyst.  Some pain is reproduced with palpation of the medial hamstring tendons. Impression: DJD and mild joint effusion.   Results for orders placed or performed  in visit on 02/24/22 (from the past 72 hour(s))  D-Dimer, Quantitative     Status: None   Collection Time: 02/24/22  3:51 PM  Result Value Ref Range   D-Dimer, Quant 0.22 <0.50 mcg/mL FEU    Comment: . The D-Dimer test is used frequently to exclude an acute PE or DVT. In patients with a low to moderate clinical risk assessment and a D-Dimer result <0.50 mcg/mL FEU, the likelihood of a PE or DVT is very low. However, a thromboembolic event should not be excluded solely on the basis of the D-Dimer level. Increased levels of D-Dimer are associated with a PE, DVT, DIC, malignancies, inflammation, sepsis, surgery, trauma, pregnancy, and advancing patient age. [Jama 2006 11:295(2):199-207] . For additional information, please refer to: http://education.questdiagnostics.com/faq/FAQ149 (This link is being provided for informational/ educational purposes only) .    No results found.     Assessment and Plan: 56 y.o. female with left posterior thigh pain.  Etiology is unclear.  Differential includes hamstring strain, S1 radiculopathy, and potentially even a DVT.  D-dimer was obtained and as above is negative so DVT is very unlikely.  Plan for home exercise program using Askling L protocol.  Consider trial of prednisone and gabapentin as needed.   PDMP not reviewed this encounter. Orders Placed This Encounter  Procedures   [place] LIMITED JOINT SPACE STRUCTURES LOW RIGHT(NO LINKED CHARGES)    Standing Status:   Future    Number of Occurrences:  1    Standing Expiration Date:   02/25/2023    Order Specific Question:   Reason for Exam (SYMPTOM  OR DIAGNOSIS REQUIRED)    Answer:   right knee pain    Order Specific Question:   Preferred imaging location?    Answer:   [place]   D-Dimer, Quantitative    Standing Status:   Future    Number of Occurrences:   1    Standing Expiration Date:   02/25/2023   No orders of the defined types were placed in this  encounter.    Discussed warning signs or symptoms. Please see discharge instructions. Patient expresses understanding.   The above documentation has been reviewed and is accurate and complete Lynne Leader, M.D.

## 2022-02-25 LAB — D-DIMER, QUANTITATIVE: D-Dimer, Quant: 0.22 mcg/mL FEU (ref <0.50)

## 2022-02-25 NOTE — Progress Notes (Signed)
D-dimer test is normal.  No evidence of blood clot based on the lab.

## 2022-03-10 ENCOUNTER — Encounter: Payer: Self-pay | Admitting: Podiatry

## 2022-03-16 ENCOUNTER — Ambulatory Visit: Payer: BC Managed Care – PPO | Admitting: Podiatry

## 2022-04-22 ENCOUNTER — Ambulatory Visit (INDEPENDENT_AMBULATORY_CARE_PROVIDER_SITE_OTHER): Payer: BC Managed Care – PPO | Admitting: Podiatry

## 2022-04-22 DIAGNOSIS — M7741 Metatarsalgia, right foot: Secondary | ICD-10-CM

## 2022-04-22 NOTE — Progress Notes (Signed)
Chief Complaint  Patient presents with   Callouses    Right foot callus -painful when pressure is applied     HPI: 57 y.o. female presenting today for follow-up evaluation of a symptomatic callus to the plantar aspect of the third MTP joint right foot.  Patient was last seen in the office 10/15/2021.  Slowly she states that the callus has returned.  It is becoming symptomatic.  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 Rise [name] 2009   OVARIAN CYST REMOVAL     RT BUNIONECTOMT     TONSILLECTOMY AND ADENOIDECTOMY     TUBAL LIGATION     UTERINE FIBROID SURGERY      Allergies  Allergen Reactions   Indocin [Indomethacin] Swelling, Rash and Other (See Comments)    REACTION: lupus like illness; butterfly rash, pain and swelling all over body.    Morphine Sulfate Nausea And Vomiting   Sulfacetamide Sodium Swelling   Caffeine Other (See Comments)    Induces A-fib   Morphine And Related     nausea   Sulfa Antibiotics Swelling   Erythromycin Stearate Hives and Swelling    CAN TAKE Z-PAK     Physical Exam: General: The patient is alert and oriented x3 in  no acute distress.  Dermatology: There continues to be some slight hyperkeratotic tissue to the plantar aspect of the third MTP however there is no pain with palpation  Vascular: Palpable pedal pulses bilaterally. Capillary refill within normal limits.  Negative for any significant edema or erythema  Neurological: Light touch and protective threshold grossly intact  Musculoskeletal Exam: No pedal deformities noted  Assessment: 1.  Porokeratosis plantar third MTP right foot 2.  Elongated metatarsal second and third right   Plan of Care:  1. Patient evaluated.  2.  Light excisional debridement of the porokeratosis was performed using a 312 scalpel without incident or bleeding.  Patient did feel significant relief.  3.  Continue wearing good supportive shoes and sneakers that support the medial longitudinal arch of the foot and offload pressure from the forefoot 4.  Return to clinic as needed     Edrick Kins, DPM [facility]  Dr. Edrick Kins, DPM    [address]  [place], [place] [address]                Office [phone]  Fax [phone]

## 2022-05-06 ENCOUNTER — Other Ambulatory Visit: Payer: BC Managed Care – PPO

## 2022-05-20 ENCOUNTER — Other Ambulatory Visit: Payer: BC Managed Care – PPO

## 2022-06-01 ENCOUNTER — Ambulatory Visit: Payer: BC Managed Care – PPO | Admitting: Internal Medicine

## 2022-06-01 ENCOUNTER — Encounter: Payer: Self-pay | Admitting: Internal Medicine

## 2022-06-01 VITALS — BP 116/74 | HR 74 | Temp 98.2°F | Ht 64.0 in

## 2022-06-01 DIAGNOSIS — S61240A Puncture wound with foreign body of right index finger without damage to nail, initial encounter: Secondary | ICD-10-CM

## 2022-06-01 DIAGNOSIS — T148XXA Other injury of unspecified body region, initial encounter: Secondary | ICD-10-CM

## 2022-06-01 NOTE — Progress Notes (Signed)
.   Subjective:    Patient ID: Regina [name], female    DOB: 1966-02-07, 57 y.o.   MRN: [id number]      HPI Regina [name] is here for  Chief Complaint  Patient presents with   Hand Pain    Splinter    Yesterday was handling pieces of wood and got a sliver in her right index finger.  She tried to take it out last night, but was not able to.  She feels it did go in pretty deep.  It is tender to touch.     Medications and allergies reviewed with patient and updated if appropriate.  Current Outpatient Medications on File Prior to Visit  Medication Sig Dispense Refill   Ascorbic Acid (VITAMIN C PO) Take by mouth.     Cholecalciferol (VITAMIN D3) 125 MCG (5000 UT) TABS Take 10,000 Units by mouth daily.     cyanocobalamin (,VITAMIN B-12,) 1000 MCG/ML injection Inject into the muscle.     loratadine (CLARITIN) 10 MG tablet Take 1 tablet (10 mg total) by mouth daily. 90 tablet 5   Magnesium 400 MG TABS Take 1 tablet by mouth daily.     Omega-3 Fatty Acids (FISH OIL PO) Take by mouth.     pantoprazole (PROTONIX) 40 MG tablet TAKE 1 TABLET BY MOUTH EVERY DAY 90 tablet 0   Probiotic Product (PROBIOTIC PO) Take by mouth.     SYRINGE-NEEDLE, DISP, 3 ML (BD ECLIPSE SYRINGE) 25G X 1" 3 ML MISC For SQ Vit B12 shots 50 each 3   triamcinolone (NASACORT) 55 MCG/ACT AERO nasal inhaler Place 2 sprays into the nose daily. 1 each 12   Turmeric 500 MG TABS Take by mouth 2 (two) times daily.     UNABLE TO FIND Immunity booster     VITAMIN K PO Take 1 tablet by mouth every other day.     ZINC GLUCONATE PO Take 1 tablet by mouth daily.     No current facility-administered medications on file prior to visit.    Review of Systems     Objective:   Vitals:   06/01/22 1132  BP: 116/74  Pulse: 74  Temp: 98.2 F (36.8 C)  SpO2: 98%   BP Readings from Last 3 Encounters:  06/01/22 116/74  02/24/22 110/60  02/23/22 104/68   Wt Readings from Last 3 Encounters:  02/24/22 173 lb (78.5 kg)  02/23/22  173 lb (78.5 kg)  09/18/21 173 lb (78.5 kg)   Body mass index is 29.7 kg/m.    Physical Exam Constitutional:      General: She is not in acute distress.    Appearance: Normal appearance. She is not ill-appearing.  HENT:     Head: Normocephalic and atraumatic.  Skin:    General: Skin is warm and dry.     Comments: Right proximal anterior index finger with small punctate hole where she was digging at it last night.  No surrounding erythema or swelling.  Mild tenderness with palpation.  No discharge or bleeding.  The tip of the splinter is visible  Neurological:     Mental Status: She is alert.       Procedure: Removal of foreign body-splinter in right index finger Indications: Splinter right index finger causing discomfort   Procedure Details Consent:  Verbal consent for procedure obtained. Performed.  The area was cleaned with alcohol swabs.     She deferred any anesthesia.  A suture remover scissors and tweezers were used to dig out  around the splinter and pull out the splinter.  There was minimal bleeding, approximately 0.25 cc of blood      Antibacterial ointment was placed and sterile dressing was applied.  Patient did tolerate procedure well.      Assessment & Plan:    Splinter in skin-right index finger: Acute Got a splinter in her finger yesterday and she did try to pick it out last night with a needle, but was not able to.  She feels the splinter went straight into the finger and went deep I was able to remove the splinter-see procedure note Tdap up-to-date Antibacterial, sterile dressing applied She will monitor for signs of infection and call with any concerns.

## 2022-06-01 NOTE — Patient Instructions (Signed)
     Your splinter was removed.   Monitor for infection.

## 2022-09-09 NOTE — Progress Notes (Signed)
57 y.o. [id number] Married White or Caucasian Not Hispanic or Latino female here for annual exam.  H/O hysterectomy. H/O VAIN I.    No dyspareunia. No change in her OAB symptoms. Some constipation.   No LMP recorded. Patient has had a hysterectomy.          Sexually active: Yes.    The current method of family planning is status post hysterectomy.    Exercising: No.   exercise Smoker:  no  Health Maintenance:07/15/21 LSIL; 01/03/20 LSIL HR HPV Neg, 08/24/18, LGSIL HPV Neg, 07/23/2017 WNL NEG HPV, 03-31-16 LGSIL HPV effect + HR HPV, 08-05-15 LGSIL may include HPV or mild dysplasia  History of abnormal Pap:  Yes. 09/22/21 VAIN I; Colpo of vaginal apex 11/21 VAIN I Pap:  07/15/21  LSIL HR HPV neg  MMG:  10/31/21 see epic breast biopsy BMD:   11-10-18, normal. Colonoscopy:06/04/21 2 precancerous polyps. F/u in 5 years.  TDaP:  06/29/15  Gardasil: none    reports that she quit smoking about 33 years ago. Her smoking use included cigarettes. She has never used smokeless tobacco. She reports current alcohol use of about 1.0 - 2.0 standard drink of alcohol per week. She reports that she does not use drugs. She is a high school Editor, commissioning. 2 kids, 5 grand kids. 1 baby on the way.   Past Medical History:  Diagnosis Date   Allergic urticaria    ARTHRALGIA    Arthritis    Atrial fibrillation (HCC)    B12 DEFICIENCY    CFS (chronic fatigue syndrome)    Endometriosis    Epidural hemorrhage with loss of consciousness (HCC) 06/29/15   Fibroid    Fibromyalgia    Fracture of cervical vertebra, C6 (HCC) 06/29/15   Gastritis    GERD    Heart murmur    History of endometriosis    IBS (irritable bowel syndrome)    Internal hemorrhoids    MVP (mitral valve prolapse)    Osteoarthritis    Rosacea    Skin cancer of arm, right    Thoracic compression fracture (HCC) 06/29/15   T2-T6    Past Surgical History:  Procedure Laterality Date   ABDOMINAL HYSTERECTOMY     partial   ABDOMINAL HYSTERECTOMY     ANAL FISSURE  REPAIR     Dr Zachery Dakins 2009   APPENDECTOMY     COLPOSCOPY     HEMORRHOID SURGERY     Dr. Zachery Dakins 2009   OVARIAN CYST REMOVAL     RT BUNIONECTOMT     TONSILLECTOMY AND ADENOIDECTOMY     TUBAL LIGATION     UTERINE FIBROID SURGERY      Current Outpatient Medications  Medication Sig Dispense Refill   Ascorbic Acid (VITAMIN C PO) Take by mouth.     Cholecalciferol (VITAMIN D3) 125 MCG (5000 UT) TABS Take 10,000 Units by mouth daily.     cyanocobalamin (,VITAMIN B-12,) 1000 MCG/ML injection Inject into the muscle.     Magnesium 400 MG TABS Take 1 tablet by mouth daily.     Omega-3 Fatty Acids (FISH OIL PO) Take by mouth.     Probiotic Product (PROBIOTIC PO) Take by mouth.     SYRINGE-NEEDLE, DISP, 3 ML (BD ECLIPSE SYRINGE) 25G X 1" 3 ML MISC For SQ Vit B12 shots 50 each 3   Turmeric 500 MG TABS Take by mouth 2 (two) times daily.     VITAMIN K PO Take 1 tablet by mouth every other day.  ZINC GLUCONATE PO Take 1 tablet by mouth daily.     No current facility-administered medications for this visit.    Family History  Problem Relation Age of Onset   Lung cancer Mother    Thyroid cancer Mother    Cancer Mother        INTESTINAL   Healthy Father    Von Willebrand disease Daughter    Breast cancer Paternal Aunt    Diabetes Other        uncles/aunts   Asthma Neg Hx    Stomach cancer Neg Hx    Colon cancer Neg Hx    Esophageal cancer Neg Hx    Pancreatic cancer Neg Hx     Review of Systems  Constitutional: Negative.   HENT: Negative.    Eyes: Negative.   Respiratory: Negative.    Cardiovascular: Negative.   Gastrointestinal: Negative.   Endocrine: Negative.   Genitourinary: Negative.   Musculoskeletal: Negative.   Skin: Negative.   Allergic/Immunologic: Negative.   Neurological: Negative.   Hematological: Negative.   Psychiatric/Behavioral: Negative.      Exam:   BP 106/68   Pulse 65   Ht 5' 4.25" (1.632 m)   Wt 174 lb (78.9 kg)   SpO2 99%   BMI 29.64  kg/m   Weight change: @WEIGHTCHANGE @ Height:   Height: 5' 4.25" (163.2 cm)  Ht Readings from Last 3 Encounters:  09/16/22 5' 4.25" (1.632 m)  06/01/22 5\' 4"  (1.626 m)  02/24/22 5\' 4"  (1.626 m)    General appearance: alert, cooperative and appears stated age Head: Normocephalic, without obvious abnormality, atraumatic Neck: no adenopathy, supple, symmetrical, trachea midline and thyroid normal to inspection and palpation Lungs: clear to auscultation bilaterally Cardiovascular: regular rate and rhythm Breasts: normal appearance, no masses or tenderness Abdomen: soft, non-tender; non distended,  no masses,  no organomegaly Extremities: extremities normal, atraumatic, no cyanosis or edema Skin: Skin color, texture, turgor normal. No rashes or lesions Lymph nodes: Cervical, supraclavicular, and axillary nodes normal. No abnormal inguinal nodes palpated Neurologic: Grossly normal   Pelvic: External genitalia:  no lesions              Urethra:  normal appearing urethra with no masses, tenderness or lesions              Bartholins and Skenes: normal                 Vagina: normal appearing vagina with normal color and discharge, no lesions              Cervix: absent               Bimanual Exam:  Uterus:  uterus absent              Adnexa: no mass, fullness, tenderness               Rectovaginal: Confirms               Anus:  normal sphincter tone, no lesions  Cornelia Copa, CMA chaperoned for the exam.  1. Well woman exam Discussed breast self exam Discussed calcium and vit D intake Mammogram due, she will schedule. Colonoscopy UTD  2. History of vaginal dysplasia - Cytology - PAP  3. Screening for vaginal cancer - Cytology - PAP

## 2022-09-16 ENCOUNTER — Ambulatory Visit (INDEPENDENT_AMBULATORY_CARE_PROVIDER_SITE_OTHER): Payer: BC Managed Care – PPO | Admitting: Obstetrics and Gynecology

## 2022-09-16 ENCOUNTER — Encounter: Payer: Self-pay | Admitting: Obstetrics and Gynecology

## 2022-09-16 ENCOUNTER — Other Ambulatory Visit (HOSPITAL_COMMUNITY)
Admission: RE | Admit: 2022-09-16 | Discharge: 2022-09-16 | Disposition: A | Discharge status: Home / Self Care | Payer: BC Managed Care – PPO | Source: Ambulatory Visit | Attending: Obstetrics and Gynecology | Admitting: Obstetrics and Gynecology

## 2022-09-16 VITALS — BP 106/68 | HR 65 | Ht 64.25 in | Wt 174.0 lb

## 2022-09-16 DIAGNOSIS — Z01419 Encounter for gynecological examination (general) (routine) without abnormal findings: Secondary | ICD-10-CM

## 2022-09-16 DIAGNOSIS — Z1272 Encounter for screening for malignant neoplasm of vagina: Secondary | ICD-10-CM | POA: Diagnosis present

## 2022-09-16 DIAGNOSIS — Z87411 Personal history of vaginal dysplasia: Secondary | ICD-10-CM | POA: Diagnosis present

## 2022-09-16 NOTE — Patient Instructions (Signed)

## 2022-09-23 LAB — CYTOLOGY - PAP
Comment: NEGATIVE
Diagnosis: UNDETERMINED — AB
High risk HPV: NEGATIVE

## 2022-09-25 ENCOUNTER — Other Ambulatory Visit: Payer: Self-pay

## 2022-09-25 DIAGNOSIS — R87620 Atypical squamous cells of undetermined significance on cytologic smear of vagina (ASC-US): Secondary | ICD-10-CM

## 2022-09-25 DIAGNOSIS — N890 Mild vaginal dysplasia: Secondary | ICD-10-CM

## 2022-09-29 ENCOUNTER — Ambulatory Visit: Payer: BC Managed Care – PPO | Admitting: Internal Medicine

## 2022-09-29 ENCOUNTER — Encounter: Payer: Self-pay | Admitting: Internal Medicine

## 2022-09-29 VITALS — BP 110/68 | HR 70 | Temp 98.2°F | Ht 64.25 in | Wt 174.0 lb

## 2022-09-29 DIAGNOSIS — E538 Deficiency of other specified B group vitamins: Secondary | ICD-10-CM

## 2022-09-29 DIAGNOSIS — R609 Edema, unspecified: Secondary | ICD-10-CM | POA: Insufficient documentation

## 2022-09-29 DIAGNOSIS — R600 Localized edema: Secondary | ICD-10-CM | POA: Diagnosis not present

## 2022-09-29 DIAGNOSIS — L84 Corns and callosities: Secondary | ICD-10-CM | POA: Diagnosis not present

## 2022-09-29 LAB — CBC WITH DIFFERENTIAL/PLATELET
Basophils Absolute: 0 10*3/uL (ref 0.0–0.1)
Basophils Relative: 0.2 % (ref 0.0–3.0)
Eosinophils Absolute: 0 10*3/uL (ref 0.0–0.7)
Eosinophils Relative: 0.1 % (ref 0.0–5.0)
HCT: 39.6 % (ref 36.0–46.0)
Hemoglobin: 13.2 g/dL (ref 12.0–15.0)
Lymphocytes Relative: 31.3 % (ref 12.0–46.0)
Lymphs Abs: 1.9 10*3/uL (ref 0.7–4.0)
MCHC: 33.2 g/dL (ref 30.0–36.0)
MCV: 88.8 fl (ref 78.0–100.0)
Monocytes Absolute: 0.4 10*3/uL (ref 0.1–1.0)
Monocytes Relative: 7.2 % (ref 3.0–12.0)
Neutro Abs: 3.6 10*3/uL (ref 1.4–7.7)
Neutrophils Relative %: 61.2 % (ref 43.0–77.0)
Platelets: 362 10*3/uL (ref 150.0–400.0)
RBC: 4.46 Mil/uL (ref 3.87–5.11)
RDW: 13.6 % (ref 11.5–15.5)
WBC: 6 10*3/uL (ref 4.0–10.5)

## 2022-09-29 LAB — URINALYSIS
Bilirubin Urine: NEGATIVE
Hgb urine dipstick: NEGATIVE
Ketones, ur: NEGATIVE
Leukocytes,Ua: NEGATIVE
Nitrite: NEGATIVE
Specific Gravity, Urine: 1.01 (ref 1.000–1.030)
Total Protein, Urine: NEGATIVE
Urine Glucose: NEGATIVE
Urobilinogen, UA: 0.2 (ref 0.0–1.0)
pH: 7 (ref 5.0–8.0)

## 2022-09-29 LAB — COMPREHENSIVE METABOLIC PANEL
ALT: 15 U/L (ref 0–35)
AST: 16 U/L (ref 0–37)
Albumin: 4.5 g/dL (ref 3.5–5.2)
Alkaline Phosphatase: 59 U/L (ref 39–117)
BUN: 11 mg/dL (ref 6–23)
CO2: 28 mEq/L (ref 19–32)
Calcium: 9.7 mg/dL (ref 8.4–10.5)
Chloride: 102 mEq/L (ref 96–112)
Creatinine, Ser: 0.77 mg/dL (ref 0.40–1.20)
GFR: 85.62 mL/min (ref >60.00)
Glucose, Bld: 82 mg/dL (ref 70–99)
Potassium: 3.8 mEq/L (ref 3.5–5.1)
Sodium: 140 mEq/L (ref 135–145)
Total Bilirubin: 0.5 mg/dL (ref 0.2–1.2)
Total Protein: 7.6 g/dL (ref 6.0–8.3)

## 2022-09-29 LAB — TSH: TSH: 1.82 u[IU]/mL (ref 0.35–5.50)

## 2022-09-29 NOTE — Assessment & Plan Note (Signed)
R foot

## 2022-09-29 NOTE — Assessment & Plan Note (Signed)
On B12 

## 2022-09-29 NOTE — Progress Notes (Signed)
Subjective:  Patient ID: [name], female    DOB: 1965/06/09  Age: 57 y.o. MRN: [id number]  CC: Edema (Swelling in feet and legs, swollen lymph nodes under jaw and neck area... no sxs of sickness recently)   HPI [name] presents for swelling in the R>L ankle swelling x 2-3 weeks.  It is better now.  Valentina Gu was on vacation. Complaining of the painful callus on the foot C/o GERD - worse  Outpatient Medications Prior to Visit  Medication Sig Dispense Refill   Ascorbic Acid (VITAMIN C PO) Take by mouth.     Cholecalciferol (VITAMIN D3) 125 MCG (5000 UT) TABS Take 10,000 Units by mouth daily.     cyanocobalamin (,VITAMIN B-12,) 1000 MCG/ML injection Inject into the muscle.     Magnesium 400 MG TABS Take 1 tablet by mouth daily.     Omega-3 Fatty Acids (FISH OIL PO) Take by mouth.     Probiotic Product (PROBIOTIC PO) Take by mouth.     SYRINGE-NEEDLE, DISP, 3 ML (BD ECLIPSE SYRINGE) 25G X 1" 3 ML MISC For SQ Vit B12 shots 50 each 3   Turmeric 500 MG TABS Take by mouth 2 (two) times daily.     VITAMIN K PO Take 1 tablet by mouth every other day.     ZINC GLUCONATE PO Take 1 tablet by mouth daily.     No facility-administered medications prior to visit.    ROS: Review of Systems  Constitutional:  Positive for unexpected weight change. Negative for activity change, appetite change, chills and fatigue.  HENT:  Negative for congestion, mouth sores and sinus pressure.   Eyes:  Negative for visual disturbance.  Respiratory:  Negative for cough and chest tightness.   Cardiovascular:  Positive for leg swelling.  Gastrointestinal:  Negative for abdominal pain and nausea.  Genitourinary:  Negative for difficulty urinating, frequency and vaginal pain.  Musculoskeletal:  Negative for back pain and gait problem.  Skin:  Negative for pallor and rash.  Neurological:  Negative for dizziness, tremors, weakness, numbness and headaches.  Psychiatric/Behavioral:  Negative for confusion and  sleep disturbance.     Objective:  BP 110/68 (BP Location: Left Arm, Patient Position: Sitting, Cuff Size: Large)   Pulse 70   Temp 98.2 F (36.8 C) (Oral)   Ht 5' 4.25" (1.632 m)   Wt 174 lb (78.9 kg)   SpO2 98%   BMI 29.64 kg/m   BP Readings from Last 3 Encounters:  09/29/22 110/68  09/16/22 106/68  06/01/22 116/74    Wt Readings from Last 3 Encounters:  09/29/22 174 lb (78.9 kg)  09/16/22 174 lb (78.9 kg)  02/24/22 173 lb (78.5 kg)    Physical Exam Musculoskeletal:     Right lower leg: No edema.     Left lower leg: No edema.  Skin:    Findings: Lesion present.   Right foot callus, painful  Procedure:   R foot callus paring/cutting  Indication:   R foot callus, painful  Consent: verbal  Risks and benefits were explained to the patient. Skin was cleaned with alcohol. I removed a large callus carefully with a round blade. Skin remained intact. Pain is better. Tolerated well. Complications: none. Bandaid applied    Lab Results  Component Value Date   WBC 5.3 09/03/2020   HGB 12.8 09/03/2020   HCT 37.5 09/03/2020   PLT 326.0 09/03/2020   GLUCOSE 80 09/03/2020   CHOL 203 (H) 07/31/2020   TRIG 126.0  07/31/2020   HDL 54.70 07/31/2020   LDLCALC 123 (H) 07/31/2020   ALT 14 09/03/2020   AST 16 09/03/2020   NA 141 09/03/2020   K 3.7 09/03/2020   CL 103 09/03/2020   CREATININE 0.72 09/03/2020   BUN 15 09/03/2020   CO2 27 09/03/2020   TSH 1.08 09/03/2020   INR 1.16 06/29/2015   HGBA1C 5.4 08/23/2018    No results found.  Assessment & Plan:   Problem List Items Addressed This Visit     B12 deficiency    On B12      Edema - Primary    Likely due to heat, travel      Relevant Orders   CBC with Differential/Platelet   Comprehensive metabolic panel   TSH   Urinalysis   Callus of foot    R foot         No orders of the defined types were placed in this encounter.     Follow-up: Return in about 3 months (around 12/30/2022) for a follow-up  visit.  Sonda Primes, MD

## 2022-09-29 NOTE — Assessment & Plan Note (Addendum)
Likely due to heat, travel.  Doing better now.  Obtain lab work eluding TSH, renal function tests.

## 2022-09-30 ENCOUNTER — Ambulatory Visit: Payer: BC Managed Care – PPO | Admitting: Internal Medicine

## 2022-10-06 ENCOUNTER — Encounter: Payer: Self-pay | Admitting: Obstetrics and Gynecology

## 2022-10-07 ENCOUNTER — Telehealth: Payer: Self-pay

## 2022-10-07 NOTE — Telephone Encounter (Signed)
Scheduling department made several attempts to call patient to schedule colposcopy. Letter was sent to patient on 10-06-22 for patient to call the office

## 2022-10-18 ENCOUNTER — Encounter: Payer: Self-pay | Admitting: Internal Medicine

## 2022-10-20 ENCOUNTER — Telehealth: Payer: Self-pay | Admitting: Obstetrics and Gynecology

## 2022-10-20 NOTE — Telephone Encounter (Signed)
Dr Oscar La had placed order for colposcopy for patient to schedule. Left message on June 7 and July 1 and mailed letter on July 9 for patient to call and schedule appointment; patient has not called back.

## 2022-10-21 ENCOUNTER — Ambulatory Visit: Payer: BC Managed Care – PPO | Admitting: Podiatry

## 2022-10-21 ENCOUNTER — Telehealth: Payer: Self-pay | Admitting: Podiatry

## 2022-10-21 NOTE — Telephone Encounter (Signed)
Left message to call Noreene Larsson, RN at [place], [phone], option 5.  09/16/22 PAP: ASCUS, neg hpv; H/o VAIN; colposcopy recommended

## 2022-10-21 NOTE — Telephone Encounter (Signed)
Pt left message today at 750am stating she will not be able to make appt today as she is sick with an upset stomach and will call to r/s   I cxled appt.

## 2022-11-06 NOTE — Telephone Encounter (Signed)
Call to patient, mailbox full, unable to leave message.  

## 2022-11-09 NOTE — Telephone Encounter (Signed)
No response from patient.  Dr. Edward Jolly, please advise.

## 2022-11-10 NOTE — Telephone Encounter (Signed)
Patient will need formal letter recommending colposcopy for evaluation of abnormal pap showing atypia of the vagina.   She has a history of vaginal dysplasia.  She has had a hysterectomy.

## 2022-11-11 NOTE — Telephone Encounter (Signed)
I approve the letter.  I will need to sign it.

## 2022-11-11 NOTE — Telephone Encounter (Signed)
Letter pended. Routing to Dr. Edward Jolly to review.

## 2022-11-11 NOTE — Telephone Encounter (Signed)
Letter sent via MyChart. Copy printed and to Dr. Edward Jolly to be signed and mailed to address on file by standard mail.   Encounter closed.

## 2022-11-12 NOTE — Telephone Encounter (Signed)
Letter mailed

## 2022-12-28 NOTE — Progress Notes (Signed)
GYNECOLOGY  VISIT   HPI: 57 y.o.   Married  Caucasian  female   [phone] with No LMP recorded. Patient has had a hysterectomy.   here for   colposcopy for pap showing ASCUS.  Hx VAIN I on colpo biopsy 09/22/21.  No prior tx of vaginal dysplasia.  Had hysterectomy for fibroids and a prolapsed cervix.  Her ovaries remain.   GYNECOLOGIC HISTORY: No LMP recorded. Patient has had a hysterectomy. Contraception:  hyst Menopausal hormone therapy:  n/a Last mammogram:  11/11/21 Breast Density Cat C, BI-RADS CAT 4 sus.  Fibrocystic change.  Due for follow up.  Last pap smear:   09/16/22 ASCUS: HR HPV neg, 07/15/21 LSIL: HR HPV neg        OB History     Gravida  3   Para  2   Term  2   Preterm  0   AB  1   Living  2      SAB  1   IAB  0   Ectopic  0   Multiple      Live Births  2              Patient Active Problem List   Diagnosis Date Noted   Edema 09/29/2022   Callus of foot 09/29/2022   Acute respiratory infection 02/23/2022   Allergic rhinitis 09/18/2021   Sinusitis 08/21/2021   Umbilical hernia with obstruction, without gangrene 03/08/2021   Herpes zoster 08/12/2020   Memory difficulties 07/23/2020   Right elbow pain 07/09/2020   Patellofemoral syndrome of right knee 12/21/2019   Acute medial meniscal tear, right, initial encounter 12/21/2019   Stress at home 10/24/2019   Wrist tendonitis 10/24/2019   Vitamin D deficiency 05/03/2019   Multiple thyroid nodules 03/08/2019   Oropharyngeal dysphagia 03/08/2019   Constipation 12/26/2018   Brachial neuritis of right upper extremity 07/06/2018   Panic attacks 10/20/2016   Loss of transverse plantar arch of right foot 09/07/2016   Aphasia 03/13/2016   Post concussion syndrome 11/01/2015   Cervical radicular pain 09/20/2015   Pain in joint, shoulder region 09/11/2015   Skull fracture with concussion (HCC) 07/30/2015   Fracture of cervical vertebra, C6 (HCC) 07/30/2015   Thoracic compression fracture (HCC)  07/23/2015   Closed fracture of base of fourth metacarpal bone of left hand 06/30/2015   Fibromyalgia 06/18/2015   Rhinitis, chronic 06/18/2015   Scaly patch rash 02/26/2015   Mitral valve regurgitation 09/27/2014   PAT (paroxysmal atrial tachycardia) (HCC) 09/06/2014   Asthmatic bronchitis 05/08/2013   Benign paroxysmal positional vertigo 05/08/2013   Depression 01/10/2013   Snoring 08/25/2012   IBS (irritable bowel syndrome) 02/08/2012   Multiple pulmonary nodules 02/08/2012   Somatic dysfunction of spine, thoracic 11/16/2010   Gastro-esophageal reflux disease without esophagitis 05/24/2010   SINUSITIS, MAXILLARY, CHRONIC 05/20/2010   Fatigue 11/20/2009   Low back pain 02/20/2009   Rosacea 12/27/2008   Allergic urticaria 12/27/2008   B12 deficiency 01/02/2008    Past Medical History:  Diagnosis Date   Allergic urticaria    ARTHRALGIA    Arthritis    Atrial fibrillation (HCC)    B12 DEFICIENCY    CFS (chronic fatigue syndrome)    Endometriosis    Epidural hemorrhage with loss of consciousness (HCC) 06/29/15   Fibroid    Fibromyalgia    Fracture of cervical vertebra, C6 (HCC) 06/29/15   Gastritis    GERD    Heart murmur    History of endometriosis  IBS (irritable bowel syndrome)    Internal hemorrhoids    MVP (mitral valve prolapse)    Osteoarthritis    Rosacea    Skin cancer of arm, right    Thoracic compression fracture (HCC) 06/29/15   T2-T6    Past Surgical History:  Procedure Laterality Date   ABDOMINAL HYSTERECTOMY     partial   ABDOMINAL HYSTERECTOMY     ANAL FISSURE REPAIR     Dr Zachery Dakins 2009   APPENDECTOMY     COLPOSCOPY     HEMORRHOID SURGERY     Dr. Zachery Dakins 2009   OVARIAN CYST REMOVAL     RT BUNIONECTOMT     TONSILLECTOMY AND ADENOIDECTOMY     TUBAL LIGATION     UTERINE FIBROID SURGERY      Current Outpatient Medications  Medication Sig Dispense Refill   Ascorbic Acid (VITAMIN C PO) Take by mouth.     Cholecalciferol (VITAMIN D3) 125  MCG (5000 UT) TABS Take 10,000 Units by mouth daily.     cyanocobalamin (,VITAMIN B-12,) 1000 MCG/ML injection Inject into the muscle.     Magnesium 400 MG TABS Take 1 tablet by mouth daily.     Omega-3 Fatty Acids (FISH OIL PO) Take by mouth.     Probiotic Product (PROBIOTIC PO) Take by mouth.     SYRINGE-NEEDLE, DISP, 3 ML (BD ECLIPSE SYRINGE) 25G X 1" 3 ML MISC For SQ Vit B12 shots 50 each 3   Turmeric 500 MG TABS Take by mouth 2 (two) times daily.     VITAMIN K PO Take 1 tablet by mouth every other day.     ZINC GLUCONATE PO Take 1 tablet by mouth daily.     No current facility-administered medications for this visit.     ALLERGIES: Indocin [indomethacin], Morphine sulfate, Sulfacetamide sodium, Caffeine, Morphine and codeine, Sulfa antibiotics, and Erythromycin stearate  Family History  Problem Relation Age of Onset   Lung cancer Mother    Thyroid cancer Mother    Cancer Mother        INTESTINAL   Healthy Father    Von Willebrand disease Daughter    Breast cancer Paternal Aunt    Diabetes Other        uncles/aunts   Asthma Neg Hx    Stomach cancer Neg Hx    Colon cancer Neg Hx    Esophageal cancer Neg Hx    Pancreatic cancer Neg Hx     Social History   Socioeconomic History   Marital status: Married    Spouse name: Not on file   Number of children: Not on file   Years of education: Not on file   Highest education level: Not on file  Occupational History   Not on file  Tobacco Use   Smoking status: Former    Current packs/day: 0.00    Types: Cigarettes    Quit date: 10/31/1988    Years since quitting: 34.2   Smokeless tobacco: Never  Vaping Use   Vaping status: Never Used  Substance and Sexual Activity   Alcohol use: Yes    Alcohol/week: 1.0 - 2.0 standard drink of alcohol    Types: 1 - 2 Standard drinks or equivalent per week    Comment: occasional    Drug use: No   Sexual activity: Yes    Partners: Male    Birth control/protection: Surgical    Comment:  hysterectomy  Other Topics Concern   Not on file  Social History  Narrative   ** Merged History Encounter **       Working 2 jobs, 6d/wk      Regular exercise-no      Divorced   Social Determinants of Corporate investment banker Strain: Low Risk  (02/23/2021)   Received from [employer], [facility]   Overall Financial Resource Strain (CARDIA)    Difficulty of Paying Living Expenses: Not very hard  Food Insecurity: Unknown (02/23/2021)   Received from [facility], [facility]   Hunger Vital Sign    Worried About Running Out of Food in the Last Year: Patient declined    Ran Out of Food in the Last Year: Patient declined  Transportation Needs: Unknown (02/23/2021)   Received from [facility], [facility]   [facility] - Transportation    Lack of Transportation (Medical): Patient declined    Lack of Transportation (Non-Medical): Patient declined  Physical Activity: Insufficiently Active (02/23/2021)   Received from [facility], [facility]   Exercise Vital Sign    Days of Exercise per Week: 6 days    Minutes of Exercise per Session: 20 min  Stress: Unknown (02/23/2021)   Received from [facility], [facility]ational [facility] - Occupational Stress Questionnaire    Feeling of Stress : Patient declined  Social Connections: Unknown (08/11/2021)   Received from [facility], [facility]   Social Network    Social Network: Not on file  Intimate Partner Violence: Unknown (07/03/2021)   Received from [facility], [facility]   HITS    Physically Hurt: Not on file    Insult or Talk Down To: Not on file    Threaten Physical Harm: Not on file    Scream or Curse: Not on file    Review of Systems  All other systems reviewed and are negative.   PHYSICAL EXAMINATION:    BP 124/80 (BP Location: Right Arm, Patient Position: Sitting, Cuff Size: Normal)   Pulse 77   Ht 5' 4.25" (1.632 m)   Wt 170 lb (77.1 kg)   SpO2 95%   BMI 28.95  kg/m     General appearance: alert, cooperative and appears stated age    Colposcopy - vagina. Consent for procedure.  3% acetic acid and then Lugol's used in vagina. White light and green light filter used.  Findings:    Cervix:  absent. Vagina:  left vaginal apex with decreased Lugol's uptake over 2 small areas approx 5 mm each.   Biopsies:  left vaginal apex.   Monsel's placed.  Minimal EBL. No complications.    Chaperone was present for exam:  Warren Lacy, CMA  ASSESSMENT  Abnormal vaginal pap.  Neg HR HPV.  Hx VAIN I.   PLAN  Fu biopsy results.  Update mammogram.  We discussed potential vaginal estrogen treatment, which the patient is declining at this time.  Annual exam with pap and HR HPV in June, 2025.

## 2023-01-11 ENCOUNTER — Other Ambulatory Visit (HOSPITAL_COMMUNITY)
Admission: RE | Admit: 2023-01-11 | Discharge: 2023-01-11 | Disposition: A | Discharge status: Home / Self Care | Payer: BC Managed Care – PPO | Source: Ambulatory Visit | Attending: Obstetrics and Gynecology | Admitting: Obstetrics and Gynecology

## 2023-01-11 ENCOUNTER — Ambulatory Visit (INDEPENDENT_AMBULATORY_CARE_PROVIDER_SITE_OTHER): Payer: BC Managed Care – PPO | Admitting: Obstetrics and Gynecology

## 2023-01-11 ENCOUNTER — Encounter: Payer: Self-pay | Admitting: Obstetrics and Gynecology

## 2023-01-11 VITALS — BP 124/80 | HR 77 | Ht 64.25 in | Wt 170.0 lb

## 2023-01-11 DIAGNOSIS — Z87411 Personal history of vaginal dysplasia: Secondary | ICD-10-CM

## 2023-01-11 DIAGNOSIS — R87629 Unspecified abnormal cytological findings in specimens from vagina: Secondary | ICD-10-CM | POA: Diagnosis present

## 2023-01-11 NOTE — Patient Instructions (Signed)
Colposcopy, Care After  The following information offers guidance on how to care for yourself after your procedure. Your health care provider may also give you more specific instructions. If you have problems or questions, contact your health care provider. What can I expect after the procedure? If you had a colposcopy without a biopsy, you can expect to feel fine right away after your procedure. However, you may have some spotting of blood for a few days. You can return to your normal activities. If you had a colposcopy with a biopsy, it is common after the procedure to have: Soreness and mild pain. These may last for a few days. Mild vaginal bleeding or discharge that is dark-colored and grainy. This may last for a few days. The discharge may be caused by a liquid (solution) that was used during the procedure. You may need to wear a sanitary pad during this time. Spotting of blood for at least 48 hours after the procedure. Follow these instructions at home: Medicines Take over-the-counter and prescription medicines only as told by your health care provider. Talk with your health care provider about what type of over-the-counter pain medicines and prescription medicines you can start to take again. It is especially important to talk with your health care provider if you take blood thinners. Activity Avoid using douche products, using tampons, and having sex for at least 3 days after the procedure or for as long as told by your health care provider. Return to your normal activities as told by your health care provider. Ask your health care provider what activities are safe for you. General instructions Ask your health care provider if you may take baths, swim, or use a hot tub. You may take showers. If you use birth control (contraception), continue to use it. Keep all follow-up visits. This is important. Contact a health care provider if: You have a fever or chills. You faint or feel  light-headed. Get help right away if: You have heavy bleeding from your vagina or pass blood clots. Heavy bleeding is bleeding that soaks through a sanitary pad in less than 1 hour. You have vaginal discharge that is abnormal, is yellow in color, or smells bad. This could be a sign of infection. You have severe pain or cramps in your lower abdomen that do not go away with medicine. Summary If you had a colposcopy without a biopsy, you can expect to feel fine right away, but you may have some spotting of blood for a few days. You can return to your normal activities. If you had a colposcopy with a biopsy, it is common to have mild pain for a few days and spotting for 48 hours after the procedure. Avoid using douche products, using tampons, and having sex for at least 3 days after the procedure or for as long as told by your health care provider. Get help right away if you have heavy bleeding, severe pain, or signs of infection. This information is not intended to replace advice given to you by your health care provider. Make sure you discuss any questions you have with your health care provider. Document Revised: 08/11/2020 Document Reviewed: 08/11/2020 Elsevier Patient Education  2024 [employer].

## 2023-01-13 LAB — SURGICAL PATHOLOGY

## 2023-01-14 ENCOUNTER — Encounter: Payer: Self-pay | Admitting: Obstetrics and Gynecology

## 2023-02-02 ENCOUNTER — Encounter: Payer: Self-pay | Admitting: Internal Medicine

## 2023-02-02 ENCOUNTER — Ambulatory Visit: Payer: BC Managed Care – PPO | Admitting: Internal Medicine

## 2023-02-02 VITALS — BP 112/70 | HR 77 | Temp 98.3°F | Ht 64.25 in | Wt 173.0 lb

## 2023-02-02 DIAGNOSIS — Z566 Other physical and mental strain related to work: Secondary | ICD-10-CM | POA: Diagnosis not present

## 2023-02-02 DIAGNOSIS — F4310 Post-traumatic stress disorder, unspecified: Secondary | ICD-10-CM

## 2023-02-02 DIAGNOSIS — F439 Reaction to severe stress, unspecified: Secondary | ICD-10-CM | POA: Diagnosis not present

## 2023-02-02 DIAGNOSIS — E538 Deficiency of other specified B group vitamins: Secondary | ICD-10-CM | POA: Diagnosis not present

## 2023-02-02 DIAGNOSIS — R55 Syncope and collapse: Secondary | ICD-10-CM | POA: Diagnosis not present

## 2023-02-02 NOTE — Assessment & Plan Note (Signed)
On B12 

## 2023-02-02 NOTE — Progress Notes (Signed)
Subjective:  Patient ID: Regina [name], female    DOB: 12/20/1965  Age: 57 y.o. MRN: [id number]  CC: Medical Management of Chronic Issues (3 mnth f/u)   HPI Regina [name] presents for lightheadedness episodes (4 episodes in the last month) at times. No LOC.  Outpatient Medications Prior to Visit  Medication Sig Dispense Refill   Ascorbic Acid (VITAMIN C PO) Take by mouth.     Cholecalciferol (VITAMIN D3) 125 MCG (5000 UT) TABS Take 10,000 Units by mouth daily.     cyanocobalamin (,VITAMIN B-12,) 1000 MCG/ML injection Inject into the muscle.     Magnesium 400 MG TABS Take 1 tablet by mouth daily.     Omega-3 Fatty Acids (FISH OIL PO) Take by mouth.     Probiotic Product (PROBIOTIC PO) Take by mouth.     SYRINGE-NEEDLE, DISP, 3 ML (BD ECLIPSE SYRINGE) 25G X 1" 3 ML MISC For SQ Vit B12 shots 50 each 3   Turmeric 500 MG TABS Take by mouth 2 (two) times daily.     VITAMIN K PO Take 1 tablet by mouth every other day.     ZINC GLUCONATE PO Take 1 tablet by mouth daily.     No facility-administered medications prior to visit.    ROS: Review of Systems  Constitutional:  Negative for activity change, appetite change, chills, fatigue and unexpected weight change.  HENT:  Negative for congestion, mouth sores and sinus pressure.   Eyes:  Negative for visual disturbance.  Respiratory:  Negative for cough and chest tightness.   Cardiovascular:  Positive for palpitations. Negative for leg swelling.  Gastrointestinal:  Negative for abdominal pain and nausea.  Genitourinary:  Negative for difficulty urinating, frequency and vaginal pain.  Musculoskeletal:  Negative for back pain and gait problem.  Skin:  Negative for pallor and rash.  Neurological:  Positive for light-headedness. Negative for dizziness, tremors, weakness, numbness and headaches.  Psychiatric/Behavioral:  Positive for sleep disturbance. Negative for confusion and suicidal ideas.     Objective:  BP 112/70 (BP Location:  Left Arm, Patient Position: Sitting, Cuff Size: Normal)   Pulse 77   Temp 98.3 F (36.8 C) (Oral)   Ht 5' 4.25" (1.632 m)   Wt 173 lb (78.5 kg)   SpO2 98%   BMI 29.46 kg/m   BP Readings from Last 3 Encounters:  02/22/23 100/62  02/02/23 112/70  01/11/23 124/80    Wt Readings from Last 3 Encounters:  02/22/23 172 lb (78 kg)  02/02/23 173 lb (78.5 kg)  01/11/23 170 lb (77.1 kg)    Physical Exam Constitutional:      General: She is not in acute distress.    Appearance: She is well-developed. She is obese.  HENT:     Head: Normocephalic.     Right Ear: External ear normal.     Left Ear: External ear normal.     Nose: Nose normal.  Eyes:     General:        Right eye: No discharge.        Left eye: No discharge.     Conjunctiva/sclera: Conjunctivae normal.     Pupils: Pupils are equal, round, and reactive to light.  Neck:     Thyroid: No thyromegaly.     Vascular: No JVD.     Trachea: No tracheal deviation.  Cardiovascular:     Rate and Rhythm: Normal rate and regular rhythm.     Heart sounds: Murmur heard.  Pulmonary:  Effort: No respiratory distress.     Breath sounds: No stridor. No wheezing.  Abdominal:     General: Bowel sounds are normal. There is no distension.     Palpations: Abdomen is soft. There is no mass.     Tenderness: There is no abdominal tenderness. There is no guarding or rebound.  Musculoskeletal:        General: No tenderness.     Cervical back: Normal range of motion and neck supple. No rigidity.  Lymphadenopathy:     Cervical: No cervical adenopathy.  Skin:    Findings: No erythema or rash.  Neurological:     Cranial Nerves: No cranial nerve deficit.     Motor: No abnormal muscle tone.     Coordination: Coordination normal.     Deep Tendon Reflexes: Reflexes normal.  Psychiatric:        Behavior: Behavior normal.        Thought Content: Thought content normal.        Judgment: Judgment normal.     Lab Results  Component Value  Date   WBC 6.0 09/29/2022   HGB 13.2 09/29/2022   HCT 39.6 09/29/2022   PLT 362.0 09/29/2022   GLUCOSE 82 09/29/2022   CHOL 203 (H) 07/31/2020   TRIG 126.0 07/31/2020   HDL 54.70 07/31/2020   LDLCALC 123 (H) 07/31/2020   ALT 15 09/29/2022   AST 16 09/29/2022   NA 140 09/29/2022   K 3.8 09/29/2022   CL 102 09/29/2022   CREATININE 0.77 09/29/2022   BUN 11 09/29/2022   CO2 28 09/29/2022   TSH 1.82 09/29/2022   INR 1.16 06/29/2015   HGBA1C 5.4 08/23/2018    No results found.  Assessment & Plan:   Problem List Items Addressed This Visit     B12 deficiency - Primary    On B12      Relevant Orders   Comprehensive metabolic panel   CBC with Differential/Platelet   TSH   Urinalysis   Vitamin B12   Stress    work/home stress Labs, ECHO, heart monitor Zyopatch        Near syncope     New lightheadedness episodes (4 episodes in the last month) at times. No LOC. 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     Follow-up: Return in about 3 months (around 05/05/2023) for a follow-up visit.  Sonda Primes, MD

## 2023-02-02 NOTE — Assessment & Plan Note (Addendum)
New lightheadedness episodes (4 episodes in the last month) at times. No LOC. 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

## 2023-02-02 NOTE — Assessment & Plan Note (Signed)
Post-TBI, fall 2017

## 2023-02-02 NOTE — Assessment & Plan Note (Addendum)
work/home stress Labs, ECHO, heart monitor Zyopatch

## 2023-02-22 ENCOUNTER — Encounter: Payer: Self-pay | Admitting: Family Medicine

## 2023-02-22 ENCOUNTER — Ambulatory Visit (INDEPENDENT_AMBULATORY_CARE_PROVIDER_SITE_OTHER): Payer: BC Managed Care – PPO | Admitting: Family Medicine

## 2023-02-22 VITALS — BP 100/62 | HR 75 | Temp 97.9°F | Resp 20 | Ht 64.5 in | Wt 172.0 lb

## 2023-02-22 DIAGNOSIS — J0140 Acute pansinusitis, unspecified: Secondary | ICD-10-CM

## 2023-02-22 MED ORDER — AMOXICILLIN-POT CLAVULANATE 875-125 MG PO TABS: 1.0000 | ORAL_TABLET | Freq: Two times a day (BID) | ORAL | 0 refills | Status: AC

## 2023-02-22 NOTE — Progress Notes (Signed)
Assessment & Plan:  1. Acute non-recurrent pansinusitis Education provided on sinus infections.  Encouraged to continue symptom management.  Recommended a humidifier with Vicks. - amoxicillin-clavulanate (AUGMENTIN) 875-125 MG tablet; Take 1 tablet by mouth 2 (two) times daily for 7 days.  Dispense: 14 tablet; Refill: 0  No results found for any visits on 02/22/23.  Follow up plan: Return if symptoms worsen or fail to improve.  Deliah Boston, MSN, APRN, FNP-C  Subjective:  HPI: Regina [name] is a 57 y.o. female presenting on 02/22/2023 for URI (Sinus congestion and cough with green drainage x 1 week. "Feels heavy to breathe" )  Patient complains of cough, head congestion, and postnasal drainage. She denies fever, nausea, vomiting, diarrhea, and body aches . Onset of symptoms was 1 week ago, gradually worsening since that time. She is drinking plenty of fluids. Evaluation to date: none. Treatment to date:  Mucinex . She has a history of asthmatic bronchitis. She does not smoke.    ROS: Negative unless specifically indicated above in HPI.   Relevant past medical history reviewed and updated as indicated.   Allergies and medications reviewed and updated.   Current Outpatient Medications:    Ascorbic Acid (VITAMIN C PO), Take by mouth., Disp: , Rfl:    Cholecalciferol (VITAMIN D3) 125 MCG (5000 UT) TABS, Take 10,000 Units by mouth daily., Disp: , Rfl:    cyanocobalamin (,VITAMIN B-12,) 1000 MCG/ML injection, Inject into the muscle., Disp: , Rfl:    Magnesium 400 MG TABS, Take 1 tablet by mouth daily., Disp: , Rfl:    Omega-3 Fatty Acids (FISH OIL PO), Take by mouth., Disp: , Rfl:    Probiotic Product (PROBIOTIC PO), Take by mouth., Disp: , Rfl:    SYRINGE-NEEDLE, DISP, 3 ML (BD ECLIPSE SYRINGE) 25G X 1" 3 ML MISC, For SQ Vit B12 shots, Disp: 50 each, Rfl: 3   Turmeric 500 MG TABS, Take by mouth 2 (two) times daily., Disp: , Rfl:    VITAMIN K PO, Take 1 tablet by mouth every other  day., Disp: , Rfl:    ZINC GLUCONATE PO, Take 1 tablet by mouth daily., Disp: , Rfl:   Allergies  Allergen Reactions   Indocin [Indomethacin] Swelling, Rash and Other (See Comments)    REACTION: lupus like illness; butterfly rash, pain and swelling all over body.    Morphine Sulfate Nausea And Vomiting   Sulfacetamide Sodium Swelling   Caffeine Other (See Comments)    Induces A-fib   Morphine And Codeine     nausea   Sulfa Antibiotics Swelling   Erythromycin Stearate Hives and Swelling    CAN TAKE Z-PAK    Objective:   BP 100/62   Pulse 75   Temp 97.9 F (36.6 C)   Resp 20   Ht 5' 4.5" (1.638 m)   Wt 172 lb (78 kg)   BMI 29.07 kg/m    Physical Exam Vitals reviewed.  Constitutional:      General: She is not in acute distress.    Appearance: Normal appearance. She is not ill-appearing, toxic-appearing or diaphoretic.  HENT:     Head: Normocephalic and atraumatic.     Right Ear: Tympanic membrane, ear canal and external ear normal. There is no impacted cerumen.     Left Ear: Tympanic membrane, ear canal and external ear normal. There is no impacted cerumen.     Nose: No congestion or rhinorrhea.     Right Sinus: Maxillary sinus tenderness and frontal sinus  tenderness present.     Left Sinus: Maxillary sinus tenderness and frontal sinus tenderness present.     Mouth/Throat:     Mouth: Mucous membranes are moist.     Pharynx: Oropharynx is clear. No oropharyngeal exudate or posterior oropharyngeal erythema.  Eyes:     General: No scleral icterus.       Right eye: No discharge.        Left eye: No discharge.     Conjunctiva/sclera: Conjunctivae normal.  Cardiovascular:     Rate and Rhythm: Normal rate and regular rhythm.     Heart sounds: Normal heart sounds. No murmur heard.    No friction rub. No gallop.  Pulmonary:     Effort: Pulmonary effort is normal. No respiratory distress.     Breath sounds: Normal breath sounds. No stridor. No wheezing, rhonchi or rales.   Musculoskeletal:        General: Normal range of motion.     Cervical back: Normal range of motion.  Lymphadenopathy:     Cervical: No cervical adenopathy.  Skin:    General: Skin is warm and dry.     Capillary Refill: Capillary refill takes less than 2 seconds.  Neurological:     General: No focal deficit present.     Mental Status: She is alert and oriented to person, place, and time. Mental status is at baseline.  Psychiatric:        Mood and Affect: Mood normal.        Behavior: Behavior normal.        Thought Content: Thought content normal.        Judgment: Judgment normal.

## 2023-02-28 ENCOUNTER — Encounter: Payer: Self-pay | Admitting: Internal Medicine

## 2023-03-14 ENCOUNTER — Encounter (HOSPITAL_COMMUNITY): Payer: Self-pay

## 2023-03-14 ENCOUNTER — Emergency Department (HOSPITAL_COMMUNITY): Payer: BC Managed Care – PPO

## 2023-03-14 ENCOUNTER — Other Ambulatory Visit: Payer: Self-pay

## 2023-03-14 ENCOUNTER — Emergency Department (HOSPITAL_COMMUNITY)
Admission: EM | Admit: 2023-03-14 | Discharge: 2023-03-14 | Disposition: A | Discharge status: Home / Self Care | Payer: BC Managed Care – PPO | Attending: Emergency Medicine | Admitting: Emergency Medicine

## 2023-03-14 DIAGNOSIS — S0083XA Contusion of other part of head, initial encounter: Secondary | ICD-10-CM | POA: Diagnosis not present

## 2023-03-14 DIAGNOSIS — W208XXA Other cause of strike by thrown, projected or falling object, initial encounter: Secondary | ICD-10-CM | POA: Insufficient documentation

## 2023-03-14 DIAGNOSIS — S0990XA Unspecified injury of head, initial encounter: Secondary | ICD-10-CM | POA: Diagnosis present

## 2023-03-14 NOTE — ED Triage Notes (Signed)
Pt reports she was moving things in her storage building and something fell on her head where she had a previous head injury several years ago.

## 2023-03-14 NOTE — ED Provider Notes (Signed)
EMERGENCY DEPARTMENT AT [facility] Provider Note   CSN: [id number] Arrival date & time: 03/14/23  1335     History  Chief Complaint  Patient presents with   Head Injury    RUSSCHELLE SLEET is a 57 y.o. female who presents to the emergency department complaining of a head injury.  Patient states that she was moving things in her storage building and a christmas tree stand fell on her head.  Reports it is in the same place as a previous head injury that she had several years ago in which she sustained an epidural hemorrhage and multiple spinal fractures.  She did not lose consciousness or fall, but has felt somewhat nauseous and feels some blurry vision in her right eye.  She is not on blood thinners.   Head Injury Associated symptoms: headache        Home Medications Prior to Admission medications   Medication Sig Start Date End Date Taking? Authorizing Provider  Ascorbic Acid (VITAMIN C PO) Take by mouth.    [provider]  Cholecalciferol (VITAMIN D3) 125 MCG (5000 UT) TABS Take 10,000 Units by mouth daily.    [provider]  cyanocobalamin (,VITAMIN B-12,) 1000 MCG/ML injection Inject into the muscle. 01/26/20   [provider]  Magnesium 400 MG TABS Take 1 tablet by mouth daily.    [provider]  Omega-3 Fatty Acids (FISH OIL PO) Take by mouth.    [provider]  Probiotic Product (PROBIOTIC PO) Take by mouth.    [provider]  SYRINGE-NEEDLE, DISP, 3 ML (BD ECLIPSE SYRINGE) 25G X 1" 3 ML MISC For SQ Vit B12 shots 07/12/19   Plotnikov, Georgina Quint, MD  Turmeric 500 MG TABS Take by mouth 2 (two) times daily.    [provider]  VITAMIN K PO Take 1 tablet by mouth every other day.    [provider]  ZINC GLUCONATE PO Take 1 tablet by mouth daily.    [provider]      Allergies    Indocin [indomethacin], Morphine sulfate, Sulfacetamide sodium, Caffeine, Morphine and  codeine, Sulfa antibiotics, and Erythromycin stearate    Review of Systems   Review of Systems  Eyes:  Positive for visual disturbance.  Neurological:  Positive for headaches.  All other systems reviewed and are negative.   Physical Exam Updated Vital Signs BP 123/74 (BP Location: Right Arm)   Pulse 64   Temp 98.4 F (36.9 C) (Oral)   Resp 16   Ht 5\' 4"  (1.626 m)   Wt 77.1 kg   SpO2 99%   BMI 29.18 kg/m  Physical Exam Vitals and nursing note reviewed.  Constitutional:      Appearance: Normal appearance.  HENT:     Head: Normocephalic and atraumatic. No raccoon eyes or Battle's sign.     Comments: Contusion noted to the right tempoparietum, no breaks in the skin  Eyes:     Conjunctiva/sclera: Conjunctivae normal.  Neck:     Comments: No midline spinal tenderness or deformities palpated Cardiovascular:     Rate and Rhythm: Normal rate and regular rhythm.  Pulmonary:     Effort: Pulmonary effort is normal. No respiratory distress.     Breath sounds: Normal breath sounds.  Abdominal:     General: There is no distension.     Palpations: Abdomen is soft.     Tenderness: There is no abdominal tenderness.  Skin:    General: Skin  is warm and dry.  Neurological:     General: No focal deficit present.     Mental Status: She is alert.     Comments: Neuro: Speech is clear, able to follow commands. CN III-XII intact grossly intact. PERRLA. EOMI. Sensation intact throughout. Str 5/5 all extremities.     ED Results / Procedures / Treatments   Labs (all labs ordered are listed, but only abnormal results are displayed) Labs Reviewed - No data to display  EKG None  Radiology CT Head Wo Contrast Result Date: 03/14/2023 CLINICAL DATA:  Head trauma, moderate-severe hx of prior skull fracture and epidural hematoma; Neck trauma, dangerous injury mechanism (Age 16-64y) hx prior C6 fracture, neck injury EXAM: CT HEAD WITHOUT CONTRAST CT CERVICAL SPINE WITHOUT CONTRAST TECHNIQUE:  Multidetector CT imaging of the head and cervical spine was performed following the standard protocol without intravenous contrast. Multiplanar CT image reconstructions of the cervical spine were also generated. RADIATION DOSE REDUCTION: This exam was performed according to the departmental dose-optimization program which includes automated exposure control, adjustment of the mA and/or kV according to patient size and/or use of iterative reconstruction technique. COMPARISON:  None Available. FINDINGS: CT HEAD FINDINGS Brain: No evidence of acute infarction, hemorrhage, hydrocephalus, extra-axial collection or mass lesion/mass effect. Vascular: No hyperdense vessel. Skull: No acute fracture. Sinuses/Orbits: No acute finding. CT CERVICAL SPINE FINDINGS Alignment: No substantial sagittal subluxation. Skull base and vertebrae: Vertebral body heights are maintained. Soft tissues and spinal canal: No prevertebral fluid or swelling. No visible canal hematoma. Disc levels:  No significant bony degenerative change. Upper chest: Visualized lung apices are clear. IMPRESSION: No evidence of acute abnormality intracranially or in the cervical spine. Electronically Signed   By: Feliberto Harts M.D.   On: 03/14/2023 15:41   CT Cervical Spine Wo Contrast Result Date: 03/14/2023 CLINICAL DATA:  Head trauma, moderate-severe hx of prior skull fracture and epidural hematoma; Neck trauma, dangerous injury mechanism (Age 53-64y) hx prior C6 fracture, neck injury EXAM: CT HEAD WITHOUT CONTRAST CT CERVICAL SPINE WITHOUT CONTRAST TECHNIQUE: Multidetector CT imaging of the head and cervical spine was performed following the standard protocol without intravenous contrast. Multiplanar CT image reconstructions of the cervical spine were also generated. RADIATION DOSE REDUCTION: This exam was performed according to the departmental dose-optimization program which includes automated exposure control, adjustment of the mA and/or kV according  to patient size and/or use of iterative reconstruction technique. COMPARISON:  None Available. FINDINGS: CT HEAD FINDINGS Brain: No evidence of acute infarction, hemorrhage, hydrocephalus, extra-axial collection or mass lesion/mass effect. Vascular: No hyperdense vessel. Skull: No acute fracture. Sinuses/Orbits: No acute finding. CT CERVICAL SPINE FINDINGS Alignment: No substantial sagittal subluxation. Skull base and vertebrae: Vertebral body heights are maintained. Soft tissues and spinal canal: No prevertebral fluid or swelling. No visible canal hematoma. Disc levels:  No significant bony degenerative change. Upper chest: Visualized lung apices are clear. IMPRESSION: No evidence of acute abnormality intracranially or in the cervical spine. Electronically Signed   By: Feliberto Harts M.D.   On: 03/14/2023 15:41    Procedures Procedures    Medications Ordered in ED Medications - No data to display  ED Course/ Medical Decision Making/ A&P                                 Medical Decision Making Amount and/or Complexity of Data Reviewed Radiology: ordered.   This patient is a 57 y.o. female  who presents to the ED for concern of head injury.   Differential diagnoses prior to evaluation: The emergent differential diagnosis includes, but is not limited to, Mild head injury, mild brain injury/concussion, skull fracture, intracranial bleeding. This is not an exhaustive differential.   Past Medical History / Co-morbidities / Social History: B12 deficiency, GERD, IBS, BPPV, paroxysmal atrial tachycardia, mitral valve regurgitation, fibromyalgia, panic disorder, PTSD  She is not on blood thinners  Additional history: Chart reviewed. Pertinent results include: records indicate head injury in 2017 with fracture of C6, thoracic compression fracture, and epidural hemorrhage  Physical Exam: Physical exam performed. The pertinent findings include: Normal vitals, no distress. Contusion noted to  the right tempo-parietal area without breaks in the skin. No battle signs or racoon eyes. No cervical midline spinal tenderness, step offs or crepitus. PERRLA.   Lab Tests/Imaging studies: I personally interpreted labs/imaging and the pertinent results include:  CT head and cervical spine with no acute abnormalities. I agree with the radiologist interpretation.  Medications: Offered tylenol for pain, patient declined.   Disposition: After consideration of the diagnostic results and the patients response to treatment, I feel that emergency department workup does not suggest an emergent condition requiring admission or immediate intervention beyond what has been performed at this time. The plan is: discharge to home with reassurance. Imaging reassuring today. The patient is safe for discharge and has been instructed to return immediately for worsening symptoms, change in symptoms or any other concerns.  Final Clinical Impression(s) / ED Diagnoses Final diagnoses:  Injury of head, initial encounter    Rx / DC Orders ED Discharge Orders     None      Portions of this report may have been transcribed using voice recognition software. Every effort was made to ensure accuracy; however, inadvertent computerized transcription errors may be present.    Jeanella Flattery 03/14/23 1609    Terrilee Files, MD 03/14/23 1739

## 2023-03-14 NOTE — Discharge Instructions (Signed)
You were seen in the emergency department after a head injury.  As we discussed, the CT scans of your head and your neck did not show any broken bones or internal bleeding.   You can take motrin or tylenol as needed for headache. Continue to monitor how you're doing and return to the ER for new or worsening symptoms.

## 2023-08-30 ENCOUNTER — Encounter: Payer: Self-pay | Admitting: Internal Medicine

## 2023-08-30 ENCOUNTER — Ambulatory Visit (INDEPENDENT_AMBULATORY_CARE_PROVIDER_SITE_OTHER): Admitting: Internal Medicine

## 2023-08-30 VITALS — BP 118/70 | HR 143 | Temp 98.6°F | Ht 64.0 in | Wt 175.0 lb

## 2023-08-30 DIAGNOSIS — L989 Disorder of the skin and subcutaneous tissue, unspecified: Secondary | ICD-10-CM | POA: Diagnosis not present

## 2023-08-30 DIAGNOSIS — E538 Deficiency of other specified B group vitamins: Secondary | ICD-10-CM

## 2023-08-30 DIAGNOSIS — L237 Allergic contact dermatitis due to plants, except food: Secondary | ICD-10-CM | POA: Diagnosis not present

## 2023-08-30 DIAGNOSIS — L259 Unspecified contact dermatitis, unspecified cause: Secondary | ICD-10-CM | POA: Insufficient documentation

## 2023-08-30 MED ORDER — DOXYCYCLINE HYCLATE 100 MG PO TABS: 100.0000 mg | ORAL_TABLET | Freq: Two times a day (BID) | ORAL | 0 refills | Status: AC

## 2023-08-30 MED ORDER — FAMOTIDINE 40 MG PO TABS: 40.0000 mg | ORAL_TABLET | Freq: Every day | ORAL | 5 refills | Status: DC | PRN

## 2023-08-30 MED ORDER — TRIAMCINOLONE ACETONIDE 0.5 % EX CREA: 1.0000 | TOPICAL_CREAM | Freq: Three times a day (TID) | CUTANEOUS | 1 refills | Status: AC

## 2023-08-30 NOTE — Progress Notes (Signed)
 Significant helping probably jump over  Subjective:  Patient ID: Regina [name], female    DOB: [birth year]  Age: 58 y.o. MRN: [id number]  CC: Rash (Pt has rash on arms x1 week and also concerns about spot on buttocks area since February and has a sensation of something crawling in it.)   HPI [name] presents for ash on arms, trunk - on Prednisone  10 mg bid   Outpatient Medications Prior to Visit  Medication Sig Dispense Refill   Ascorbic Acid (VITAMIN C PO) Take by mouth.     Cholecalciferol  (VITAMIN D3) 125 MCG (5000 UT) TABS Take 10,000 Units by mouth daily.     cyanocobalamin  (,VITAMIN B-12,) 1000 MCG/ML injection Inject into the muscle.     Magnesium  400 MG TABS Take 1 tablet by mouth daily.     Omega-3 Fatty Acids (FISH OIL PO) Take by mouth.     Probiotic Product (PROBIOTIC PO) Take by mouth.     SYRINGE-NEEDLE, DISP, 3 ML (BD ECLIPSE SYRINGE) 25G X 1" 3 ML MISC For SQ Vit B12 shots 50 each 3   Turmeric 500 MG TABS Take by mouth 2 (two) times daily.     VITAMIN K PO Take 1 tablet by mouth every other day.     ZINC GLUCONATE PO Take 1 tablet by mouth daily.     No facility-administered medications prior to visit.    ROS: Review of Systems  Constitutional:  Negative for activity change, appetite change, chills, fatigue and unexpected weight change.  HENT:  Negative for congestion, mouth sores and sinus pressure.   Eyes:  Negative for visual disturbance.  Respiratory:  Negative for cough and chest tightness.   Gastrointestinal:  Negative for abdominal pain and nausea.  Genitourinary:  Negative for difficulty urinating, frequency and vaginal pain.  Musculoskeletal:  Negative for back pain and gait problem.  Skin:  Positive for rash. Negative for pallor.  Neurological:  Negative for dizziness, tremors, weakness, numbness and headaches.  Psychiatric/Behavioral:  Negative for confusion, sleep disturbance and suicidal ideas.     Objective:  BP 118/70   Pulse (!) 143    Temp 98.6 F (37 C) (Oral)   Ht 5\' 4"  (1.626 m)   Wt 175 lb (79.4 kg)   SpO2 97%   BMI 30.04 kg/m   BP Readings from Last 3 Encounters:  08/30/23 118/70  03/14/23 123/74  02/22/23 100/62    Wt Readings from Last 3 Encounters:  08/30/23 175 lb (79.4 kg)  03/14/23 170 lb (77.1 kg)  02/22/23 172 lb (78 kg)    Physical Exam Constitutional:      General: She is not in acute distress.    Appearance: She is well-developed.  HENT:     Head: Normocephalic.     Right Ear: External ear normal.     Left Ear: External ear normal.     Nose: Nose normal.  Eyes:     General:        Right eye: No discharge.        Left eye: No discharge.     Conjunctiva/sclera: Conjunctivae normal.     Pupils: Pupils are equal, round, and reactive to light.  Neck:     Thyroid : No thyromegaly.     Vascular: No JVD.     Trachea: No tracheal deviation.  Cardiovascular:     Rate and Rhythm: Normal rate and regular rhythm.     Heart sounds: Normal heart sounds.  Pulmonary:  Effort: No respiratory distress.     Breath sounds: No stridor. No wheezing.  Abdominal:     General: Bowel sounds are normal. There is no distension.     Palpations: Abdomen is soft. There is no mass.     Tenderness: There is no abdominal tenderness. There is no guarding or rebound.  Musculoskeletal:        General: No tenderness.     Cervical back: Normal range of motion and neck supple. No rigidity.  Lymphadenopathy:     Cervical: No cervical adenopathy.  Skin:    Findings: No erythema or rash.  Neurological:     Cranial Nerves: No cranial nerve deficit.     Motor: No abnormal muscle tone.     Coordination: Coordination normal.     Gait: Gait normal.     Deep Tendon Reflexes: Reflexes normal.  Psychiatric:        Behavior: Behavior normal.        Thought Content: Thought content normal.        Judgment: Judgment normal.    Rash  L buttock -post tick bite likely 1 mm  Lab Results  Component Value Date    WBC 6.0 09/29/2022   HGB 13.2 09/29/2022   HCT 39.6 09/29/2022   PLT 362.0 09/29/2022   GLUCOSE 82 09/29/2022   CHOL 203 (H) 07/31/2020   TRIG 126.0 07/31/2020   HDL 54.70 07/31/2020   LDLCALC 123 (H) 07/31/2020   ALT 15 09/29/2022   AST 16 09/29/2022   NA 140 09/29/2022   K 3.8 09/29/2022   CL 102 09/29/2022   CREATININE 0.77 09/29/2022   BUN 11 09/29/2022   CO2 28 09/29/2022   TSH 1.82 09/29/2022   INR 1.16 06/29/2015   HGBA1C 5.4 08/23/2018    CT Head Wo Contrast Result Date: 03/14/2023 CLINICAL DATA:  Head trauma, moderate-severe hx of prior skull fracture and epidural hematoma; Neck trauma, dangerous injury mechanism (Age 73-64y) hx prior C6 fracture, neck injury EXAM: CT HEAD WITHOUT CONTRAST CT CERVICAL SPINE WITHOUT CONTRAST TECHNIQUE: Multidetector CT imaging of the head and cervical spine was performed following the standard protocol without intravenous contrast. Multiplanar CT image reconstructions of the cervical spine were also generated. RADIATION DOSE REDUCTION: This exam was performed according to the departmental dose-optimization program which includes automated exposure control, adjustment of the mA and/or kV according to patient size and/or use of iterative reconstruction technique. COMPARISON:  None Available. FINDINGS: CT HEAD FINDINGS Brain: No evidence of acute infarction, hemorrhage, hydrocephalus, extra-axial collection or mass lesion/mass effect. Vascular: No hyperdense vessel. Skull: No acute fracture. Sinuses/Orbits: No acute finding. CT CERVICAL SPINE FINDINGS Alignment: No substantial sagittal subluxation. Skull base and vertebrae: Vertebral body heights are maintained. Soft tissues and spinal canal: No prevertebral fluid or swelling. No visible canal hematoma. Disc levels:  No significant bony degenerative change. Upper chest: Visualized lung apices are clear. IMPRESSION: No evidence of acute abnormality intracranially or in the cervical spine. Electronically  Signed   By: Stevenson Elbe M.D.   On: 03/14/2023 15:41   CT Cervical Spine Wo Contrast Result Date: 03/14/2023 CLINICAL DATA:  Head trauma, moderate-severe hx of prior skull fracture and epidural hematoma; Neck trauma, dangerous injury mechanism (Age 52-64y) hx prior C6 fracture, neck injury EXAM: CT HEAD WITHOUT CONTRAST CT CERVICAL SPINE WITHOUT CONTRAST TECHNIQUE: Multidetector CT imaging of the head and cervical spine was performed following the standard protocol without intravenous contrast. Multiplanar CT image reconstructions of the cervical spine were also  generated. RADIATION DOSE REDUCTION: This exam was performed according to the departmental dose-optimization program which includes automated exposure control, adjustment of the mA and/or kV according to patient size and/or use of iterative reconstruction technique. COMPARISON:  None Available. FINDINGS: CT HEAD FINDINGS Brain: No evidence of acute infarction, hemorrhage, hydrocephalus, extra-axial collection or mass lesion/mass effect. Vascular: No hyperdense vessel. Skull: No acute fracture. Sinuses/Orbits: No acute finding. CT CERVICAL SPINE FINDINGS Alignment: No substantial sagittal subluxation. Skull base and vertebrae: Vertebral body heights are maintained. Soft tissues and spinal canal: No prevertebral fluid or swelling. No visible canal hematoma. Disc levels:  No significant bony degenerative change. Upper chest: Visualized lung apices are clear. IMPRESSION: No evidence of acute abnormality intracranially or in the cervical spine. Electronically Signed   By: Stevenson Elbe M.D.   On: 03/14/2023 15:41    Assessment & Plan:   Problem List Items Addressed This Visit     B12 deficiency   On B12      Contact dermatitis - Primary   New -?contact dermatitis -  on Prednisone  10 mg bid Triamc cream - tid       Skin lesion   L buttock -post tick bite likely Triamc cream Doxy if not better         Meds ordered this  encounter  Medications   triamcinolone  cream (KENALOG ) 0.5 %    Sig: Apply 1 Application topically 3 (three) times daily.    Dispense:  90 g    Refill:  1   famotidine  (PEPCID ) 40 MG tablet    Sig: Take 1 tablet (40 mg total) by mouth daily as needed for heartburn or indigestion.    Dispense:  30 tablet    Refill:  5   doxycycline  (VIBRA -TABS) 100 MG tablet    Sig: Take 1 tablet (100 mg total) by mouth 2 (two) times daily.    Dispense:  20 tablet    Refill:  0      Follow-up: Return in about 3 months (around 11/30/2023) for a follow-up visit.  Anitra Barn, MD

## 2023-08-30 NOTE — Assessment & Plan Note (Signed)
 New -?contact dermatitis -  on Prednisone  10 mg bid Triamc cream - tid

## 2023-08-30 NOTE — Assessment & Plan Note (Signed)
 L buttock -post tick bite likely Triamc cream Doxy if not better

## 2023-08-30 NOTE — Assessment & Plan Note (Signed)
Off Effexor

## 2023-08-30 NOTE — Assessment & Plan Note (Signed)
 On B12

## 2023-09-17 NOTE — Progress Notes (Unsigned)
 58 y.o. G23P2012 Married Caucasian female here for annual exam.    Went to the ER yesterday.  Generally not feeling well and discomfort radiating down her leg Has an itching in the area of her coccyx. Area drained.  She has seen her PCP previously and received Rx for Doxycycline .  Had US  neg for DVT 09/19/23 in the ER.  In the ER, she was dx with UTI.   Abx changed to Keflex , which she has not started.   Stated recent episode of poison sumac.  Tx with prednisone .   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  Last pap 09/16/22 ASCUS, neg HR HPV. 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   FSH 72.8 08/23/18.  Has some constipation.   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   PCP: Plotnikov, Karlynn GAILS, MD   No LMP recorded. Patient has had a hysterectomy.           Sexually active: Yes.    The current method of family planning is status post hysterectomy.    Menopausal hormone therapy:  n/a Exercising: No.   Smoker:  Former  OB History  Gravida Para Term Preterm AB Living  3 2 2  0 1 2  SAB IAB Ectopic Multiple Live Births  1 0 0  2    # Outcome Date GA Lbr Len/2nd Weight Sex Type Anes PTL Lv  3 SAB           2 Term      Vag-Spont   LIV  1 Term      Vag-Spont   LIV     HEALTH MAINTENANCE: Last 2 paps:  09/16/22 ASCUS, HR HPV neg, 07/16/22 LSIL HR HPV neg History of abnormal Pap or positive HPV:  yes Mammogram:   11/11/21 Breast Density Cat C, BIRADS Cat 4 Sus.  Biopsy right breast fibrocystic change.  Due for dx imaging or right breast Feb. 2024.   Colonoscopy:  06/04/21 polyp - due in 2028.  Bone Density:  11/10/18  Result  normal   Immunization History  Administered Date(s) Administered   Td 03/14/2010   Tdap 06/29/2015      reports that she quit smoking about 34 years ago. Her smoking use included cigarettes. She has never used smokeless tobacco. She reports current alcohol use of about 1.0 - 2.0 standard drink of alcohol per week. She reports that she does not use  drugs.  Past Medical History:  Diagnosis Date   Allergic urticaria    ARTHRALGIA    Arthritis    Atrial fibrillation (HCC)    B12 DEFICIENCY    CFS (chronic fatigue syndrome)    Endometriosis    Epidural hemorrhage with loss of consciousness (HCC) 06/29/2015   Fibroid    Fibromyalgia    Fracture of cervical vertebra, C6 (HCC) 06/29/2015   Gastritis    GERD    Heart murmur    History of endometriosis    IBS (irritable bowel syndrome)    Internal hemorrhoids    MVP (mitral valve prolapse)    Osteoarthritis    Rosacea    Skin cancer of arm, right    Thoracic compression fracture (HCC) 06/29/2015   T2-T6   VAIN I (vaginal intraepithelial neoplasia grade I)     Past Surgical History:  Procedure Laterality Date   ABDOMINAL HYSTERECTOMY     partial   ABDOMINAL HYSTERECTOMY     ANAL FISSURE REPAIR     Dr Lorriane 2009  APPENDECTOMY     COLPOSCOPY     HEMORRHOID SURGERY     Dr. Lorriane 2009   OVARIAN CYST REMOVAL     RT BUNIONECTOMT     TONSILLECTOMY AND ADENOIDECTOMY     TUBAL LIGATION     UTERINE FIBROID SURGERY      Current Outpatient Medications  Medication Sig Dispense Refill   Ascorbic Acid (VITAMIN C PO) Take by mouth.     cefadroxil (DURICEF) 500 MG capsule Take 1 capsule (500 mg total) by mouth 2 (two) times daily. 10 capsule 0   Cholecalciferol  (VITAMIN D3) 125 MCG (5000 UT) TABS Take 10,000 Units by mouth daily.     cyanocobalamin  (,VITAMIN B-12,) 1000 MCG/ML injection Inject into the muscle.     famotidine  (PEPCID ) 40 MG tablet Take 1 tablet (40 mg total) by mouth daily as needed for heartburn or indigestion. 30 tablet 5   Magnesium  400 MG TABS Take 1 tablet by mouth daily.     Omega-3 Fatty Acids (FISH OIL PO) Take by mouth.     Probiotic Product (PROBIOTIC PO) Take by mouth.     SYRINGE-NEEDLE, DISP, 3 ML (BD ECLIPSE SYRINGE) 25G X 1 3 ML MISC For SQ Vit B12 shots 50 each 3   triamcinolone  cream (KENALOG ) 0.5 % Apply 1 Application topically 3  (three) times daily. 90 g 1   Turmeric 500 MG TABS Take by mouth 2 (two) times daily.     VITAMIN K PO Take 1 tablet by mouth every other day.     ZINC GLUCONATE PO Take 1 tablet by mouth daily.     doxycycline  (VIBRA -TABS) 100 MG tablet Take 1 tablet (100 mg total) by mouth 2 (two) times daily. (Patient not taking: Reported on 09/20/2023) 20 tablet 0   No current facility-administered medications for this visit.    ALLERGIES: Indocin [indomethacin], Morphine sulfate, Sulfacetamide sodium, Caffeine, Morphine and codeine , Sulfa antibiotics, and Erythromycin stearate  Family History  Problem Relation Age of Onset   Lung cancer Mother    Thyroid  cancer Mother    Cancer Mother        INTESTINAL   Healthy Father    Von Willebrand disease Daughter    Breast cancer Paternal Aunt    Diabetes Other        uncles/aunts   Asthma Neg Hx    Stomach cancer Neg Hx    Colon cancer Neg Hx    Esophageal cancer Neg Hx    Pancreatic cancer Neg Hx     Review of Systems  All other systems reviewed and are negative.   PHYSICAL EXAM:  BP 116/76 (BP Location: Left Arm, Patient Position: Sitting)   Pulse 78   Ht 5' 5 (1.651 m)   Wt 175 lb (79.4 kg)   SpO2 98%   BMI 29.12 kg/m     General appearance: alert, cooperative and appears stated age Head: normocephalic, without obvious abnormality, atraumatic Neck: no adenopathy, supple, symmetrical, trachea midline and thyroid  normal to inspection and palpation Lungs: clear to auscultation bilaterally Breasts: normal appearance, no masses or tenderness, No nipple retraction or dimpling, No nipple discharge or bleeding, No axillary adenopathy Heart: regular rate and rhythm Abdomen: soft, mildly tender left abdomen with no guarding or rebound; no masses, no organomegaly Extremities: extremities normal, atraumatic, no cyanosis or edema Skin: skin color, texture, turgor normal. No rashes or lesions.  Small scar of right buttock region.  Evidence of skin  indentation of left buttock region.  Lymph  nodes: cervical, supraclavicular, and axillary nodes normal. Neurologic: grossly normal  Pelvic: External genitalia:  no lesions              No abnormal inguinal nodes palpated.              Urethra:  normal appearing urethra with no masses, tenderness or lesions              Bartholins and Skenes: normal                 Vagina: normal appearing vagina with normal color and discharge, no lesions              Cervix: absent              Pap taken: yes Bimanual Exam:  Uterus:  absent              Adnexa: no mass, fullness, tenderness              Rectal exam: yes.  Confirms.              Anus:  normal sphincter tone, no lesions  Chaperone was present for exam:  Kari HERO, CMA  ASSESSMENT: Well woman visit with gynecologic exam. Status post TAH for fibroids and prolapsed cervix.  Ovaries remain.  Hx VAIN I. Atrial fibrillation.  PHQ-2-9: 0. Hx thoracic compression fracture, from a fall 20 feet.  Has short term memory issues post fall.  Malaise.   PLAN: Mammogram dx bilateral discussed.  Office will help schedule. Self breast awareness reviewed. Pap and HRV collected:  yes Guidelines for Calcium, Vitamin D , regular exercise program including cardiovascular and weight bearing exercise. Medication refills:  NA Follow up with PCP regarding recent symptoms of not feeling well.  She may need screening for tick born illnesses. Follow up:  yearly and prn.

## 2023-09-19 ENCOUNTER — Emergency Department (HOSPITAL_BASED_OUTPATIENT_CLINIC_OR_DEPARTMENT_OTHER)

## 2023-09-19 ENCOUNTER — Other Ambulatory Visit: Payer: Self-pay

## 2023-09-19 ENCOUNTER — Encounter (HOSPITAL_BASED_OUTPATIENT_CLINIC_OR_DEPARTMENT_OTHER): Payer: Self-pay | Admitting: Emergency Medicine

## 2023-09-19 ENCOUNTER — Emergency Department (HOSPITAL_BASED_OUTPATIENT_CLINIC_OR_DEPARTMENT_OTHER): Admission: EM | Admit: 2023-09-19 | Discharge: 2023-09-19 | Disposition: A | Discharge status: Home / Self Care

## 2023-09-19 DIAGNOSIS — M7989 Other specified soft tissue disorders: Secondary | ICD-10-CM

## 2023-09-19 DIAGNOSIS — M6740 Ganglion, unspecified site: Secondary | ICD-10-CM | POA: Insufficient documentation

## 2023-09-19 DIAGNOSIS — Z87891 Personal history of nicotine dependence: Secondary | ICD-10-CM | POA: Diagnosis not present

## 2023-09-19 DIAGNOSIS — M67471 Ganglion, right ankle and foot: Secondary | ICD-10-CM

## 2023-09-19 LAB — BASIC METABOLIC PANEL WITH GFR
Anion gap: 13 (ref 5–15)
BUN: 17 mg/dL (ref 6–20)
CO2: 26 mmol/L (ref 22–32)
Calcium: 9.9 mg/dL (ref 8.9–10.3)
Chloride: 104 mmol/L (ref 98–111)
Creatinine, Ser: 0.8 mg/dL (ref 0.44–1.00)
GFR, Estimated: >60 mL/min (ref >60)
Glucose, Bld: 86 mg/dL (ref 70–99)
Potassium: 3.6 mmol/L (ref 3.5–5.1)
Sodium: 143 mmol/L (ref 135–145)

## 2023-09-19 LAB — URINALYSIS, ROUTINE W REFLEX MICROSCOPIC
Bilirubin Urine: NEGATIVE
Glucose, UA: NEGATIVE mg/dL
Ketones, ur: NEGATIVE mg/dL
Leukocytes,Ua: NEGATIVE
Nitrite: NEGATIVE
Protein, ur: NEGATIVE mg/dL
Specific Gravity, Urine: 1.024 (ref 1.005–1.030)
pH: 5.5 (ref 5.0–8.0)

## 2023-09-19 LAB — CBC
HCT: 37.9 % (ref 36.0–46.0)
Hemoglobin: 12.6 g/dL (ref 12.0–15.0)
MCH: 29.1 pg (ref 26.0–34.0)
MCHC: 33.2 g/dL (ref 30.0–36.0)
MCV: 87.5 fL (ref 80.0–100.0)
Platelets: 369 10*3/uL (ref 150–400)
RBC: 4.33 MIL/uL (ref 3.87–5.11)
RDW: 12.7 % (ref 11.5–15.5)
WBC: 7.2 10*3/uL (ref 4.0–10.5)
nRBC: 0 % (ref 0.0–0.2)

## 2023-09-19 MED ORDER — CEFADROXIL 500 MG PO CAPS: 500.0000 mg | ORAL_CAPSULE | Freq: Two times a day (BID) | ORAL | 0 refills | Status: AC

## 2023-09-19 NOTE — Discharge Instructions (Addendum)
 As discussed, your workup today was overall reassuring.  Ultrasound of your right leg did not show evidence of blood clot.  Your labs were normal.  Your urine did have some bacteria in it so we will treat this with antibiotics.  Please stop taking the doxycycline .  Recommend follow-up with your primary care for reassessment.  Please do not hesitate to return to emergency department if the worrisome signs and symptoms we discussed become apparent.

## 2023-09-19 NOTE — ED Triage Notes (Signed)
 At the beach over the weekend,  Not feeling well,  right foot/ leg swelling  Has a wound on back since feb. Concerned about it, has been on doxy for it

## 2023-09-19 NOTE — ED Notes (Signed)
 RN reviewed discharge instructions with pt. Pt verbalized understanding and had no further questions

## 2023-09-19 NOTE — ED Provider Notes (Signed)
 [place] EMERGENCY DEPARTMENT AT [facility] Provider Note   CSN: [id number] Arrival date & time: 09/19/23  1700     Patient presents with: Leg Swelling   Regina [name] is a 58 y.o. female.   HPI   58 year old female presents emergency department with complaints of a few different complaints.  States that she was at the beach over the weekend and started to feel generally not well.  States that she noticed a bump on the top of her right foot that was concerning.  States that she feels like there is swelling in her right leg.  Also states that she did notice dark-colored urine today.  States that she has been dealing with a area in her right buttock near her butt crack that she has been applying topical antibiotic cream/ointment to; states this area ruptured over the past couple days and has been taking antibiotic in the form of doxycycline  for it.  Patient also describing fullness/swelling in her right leg that has been present for the past few days.  Denies any fevers, chills, chest pain, shortness of breath, cough, nasal congestion, abdominal pain, nausea, vomiting.  Past medical history significant for epidural hemorrhage, thoracic spine fractures, fibromyalgia, GERD, murmur, endometriosis, IBS, internal hemorrhoids, OA, B12 deficiency, BPPV, skull fracture  Prior to Admission medications   Medication Sig Start Date End Date Taking? Authorizing Provider  Ascorbic Acid (VITAMIN C PO) Take by mouth.    [provider]  Cholecalciferol  (VITAMIN D3) 125 MCG (5000 UT) TABS Take 10,000 Units by mouth daily.    [provider]  cyanocobalamin  (,VITAMIN B-12,) 1000 MCG/ML injection Inject into the muscle. 01/26/20   [provider]  doxycycline  (VIBRA -TABS) 100 MG tablet Take 1 tablet (100 mg total) by mouth 2 (two) times daily. 08/30/23   Plotnikov, Aleksei V, MD  famotidine  (PEPCID ) 40 MG tablet Take 1 tablet (40 mg total) by mouth daily as needed for  heartburn or indigestion. 08/30/23   Plotnikov, Aleksei V, MD  Magnesium  400 MG TABS Take 1 tablet by mouth daily.    [provider]  Omega-3 Fatty Acids (FISH OIL PO) Take by mouth.    [provider]  Probiotic Product (PROBIOTIC PO) Take by mouth.    [provider]  SYRINGE-NEEDLE, DISP, 3 ML (BD ECLIPSE SYRINGE) 25G X 1 3 ML MISC For SQ Vit B12 shots 07/12/19   Plotnikov, Aleksei V, MD  triamcinolone  cream (KENALOG ) 0.5 % Apply 1 Application topically 3 (three) times daily. 08/30/23 08/29/24  Plotnikov, Aleksei V, MD  Turmeric 500 MG TABS Take by mouth 2 (two) times daily.    [provider]  VITAMIN K PO Take 1 tablet by mouth every other day.    [provider]  ZINC GLUCONATE PO Take 1 tablet by mouth daily.    [provider]    Allergies: Indocin [indomethacin], Morphine sulfate, Sulfacetamide sodium, Caffeine, Morphine and codeine , Sulfa antibiotics, and Erythromycin stearate    Review of Systems  All other systems reviewed and are negative.   Updated Vital Signs BP 123/84   Pulse 84   Temp 98.3 F (36.8 C) (Oral)   Resp 20   SpO2 100%   Physical Exam Vitals and nursing note reviewed.  Constitutional:      General: She is not in acute distress.    Appearance: She is well-developed.  HENT:     Head: Normocephalic and atraumatic.   Eyes:     Conjunctiva/sclera: Conjunctivae normal.  Cardiovascular:     Rate and Rhythm: Normal rate and regular rhythm.     Heart sounds: No murmur heard. Pulmonary:     Effort: Pulmonary effort is normal. No respiratory distress.     Breath sounds: Normal breath sounds. No wheezing, rhonchi or rales.  Abdominal:     Palpations: Abdomen is soft.     Tenderness: There is no abdominal tenderness.  Genitourinary:     Comments: Punctate area of scabbing appreciated just right of gluteal cleft where the area of ruptured cyst present.  No obvious erythema, palpable  fluctuance/induration present.  Musculoskeletal:        General: No swelling.     Cervical back: Neck supple.     Comments: Patient with slightly palpable fluctuant area along extensor tendon dorsal aspect of right foot about mid first metacarpal.  No obvious erythema induration present.  Mobile with movement of extensor tendon.  No lower extremity edema.  Pedal posttibial pulses 2+ bilaterally.  Full range of motion bilateral lower extremities.  Muscular strength 5-5 lower extremities.  No sensory deficits on major nerve root regions of lower extremities.   Skin:    General: Skin is warm and dry.     Capillary Refill: Capillary refill takes less than 2 seconds.   Neurological:     Mental Status: She is alert.   Psychiatric:        Mood and Affect: Mood normal.     (all labs ordered are listed, but only abnormal results are displayed) Labs Reviewed - No data to display  EKG: None  Radiology: No results found.   Procedures   Medications Ordered in the ED - No data to display                                  Medical Decision Making Amount and/or Complexity of Data Reviewed Labs: ordered.  Risk Prescription drug management.   This patient presents to the ED for concern of dark-colored urine, right leg swelling, this involves an extensive number of treatment options, and is a complaint that carries with it a high risk of complications and morbidity.  The differential diagnosis includes fracture, dislocation, ganglion cyst, UTI, pyonephritis, nephrolithiasis, pilonidal cyst, cellulitis, erysipelas, negative is an infection, ischemic limb, DVT, other   Co morbidities that complicate the patient evaluation  See HPI   Additional history obtained:  Additional history obtained from EMR External records from outside source obtained and reviewed including hospital records   Lab Tests:  I Ordered, and personally interpreted labs.  The pertinent results include: No  leukocytosis.  No evidence of anemia.  Platelets within range.  No extremities.  No renal dysfunction.  UA with rare bacteria, trace hemoglobin otherwise unremarkable.   Imaging Studies ordered:  I ordered imaging studies including right lower extremity ultrasound I independently visualized and interpreted imaging which showed DVT has been taking I agree with the radiologist interpretation   Cardiac Monitoring: / EKG:  The patient was maintained on a cardiac monitor.  I personally viewed and interpreted the cardiac monitored which showed an underlying rhythm of: Sinus rhythm   Consultations Obtained:  N/a   Problem List / ED Course / Critical interventions / Medication management  Ganglion cyst, swelling right leg Reevaluation of the patient showed that the patient stayed the same I have reviewed the patients home medicines and have made adjustments as needed   Social Determinants of  Health:  Former cigarette use.  Denies illicit drug use.   Test / Admission - Considered:  Ganglion cyst, swelling right leg Vitals signs within normal range and stable throughout visit. Laboratory/imaging studies significant for: See above 58 year old female presents emergency department with complaints of a few different complaints.  States that she was at the beach over the weekend and started to feel generally not well.  States that she noticed a bump on the top of her right foot that was concerning.  States that she feels like there is swelling in her right leg.  Also states that she did notice dark-colored urine today.  States that she has been dealing with a area in her right buttock near her butt crack that she has been applying topical antibiotic cream/ointment to; states this area ruptured over the past couple days and has been taking antibiotic in the form of doxycycline  for it.  Patient also describing fullness/swelling in her right leg that has been present for the past few days.   Denies any fevers, chills, chest pain, shortness of breath, cough, nasal congestion, abdominal pain, nausea, vomiting. On exam, no pulse deficits or lower extremity to be suspicious for ischemic limb.  No bony tenderness reported traumatic mechanism to be suspicious for fracture or dislocation.  Overlying skin changes concerning for secondary infectious process of patient's right lower extremity no lower extremity edema but given patient's feelings of right leg swelling, ultrasound was obtained which was negative for any acute DVT.  Patient was with evidence of ganglion cyst along extensor tendon right foot as above.  Area where patient described bilateral cyst rupture well-appearing without evidence of steady skin changes appreciable abscess present currently.  Basic labs unremarkable for any acute process.  UA with bacteria otherwise unremarkable.  Given patient's urinary symptoms, will treat empirically for UTI with Duricef.  Will recommend cessation use of doxycycline .  Will recommend follow-up with PCP in the outpatient setting for reassessment.  Treatment plan discussed with patient and treatment understanding was agreeable to said plan.  Patient will well-appearing, afebrile in no acute distress. Worrisome signs and symptoms were discussed with the patient, and the patient acknowledged understanding to return to the ED if noticed. Patient was stable upon discharge.       Final diagnoses:  None    ED Discharge Orders     None          Silver Wonda LABOR, [place] 09/19/23 1909    Neysa Caron PARAS, DO 09/19/23 [id number]

## 2023-09-20 ENCOUNTER — Ambulatory Visit (INDEPENDENT_AMBULATORY_CARE_PROVIDER_SITE_OTHER): Payer: BC Managed Care – PPO | Admitting: Obstetrics and Gynecology

## 2023-09-20 ENCOUNTER — Other Ambulatory Visit: Payer: Self-pay | Admitting: Obstetrics and Gynecology

## 2023-09-20 ENCOUNTER — Other Ambulatory Visit (HOSPITAL_COMMUNITY)
Admission: RE | Admit: 2023-09-20 | Discharge: 2023-09-20 | Disposition: A | Discharge status: Home / Self Care | Source: Ambulatory Visit | Attending: Obstetrics and Gynecology | Admitting: Obstetrics and Gynecology

## 2023-09-20 ENCOUNTER — Telehealth: Payer: Self-pay | Admitting: Obstetrics and Gynecology

## 2023-09-20 ENCOUNTER — Encounter: Payer: Self-pay | Admitting: Obstetrics and Gynecology

## 2023-09-20 VITALS — BP 116/76 | HR 78 | Ht 65.0 in | Wt 175.0 lb

## 2023-09-20 DIAGNOSIS — Z1272 Encounter for screening for malignant neoplasm of vagina: Secondary | ICD-10-CM | POA: Diagnosis present

## 2023-09-20 DIAGNOSIS — Z87411 Personal history of vaginal dysplasia: Secondary | ICD-10-CM | POA: Insufficient documentation

## 2023-09-20 DIAGNOSIS — Z1331 Encounter for screening for depression: Secondary | ICD-10-CM | POA: Diagnosis not present

## 2023-09-20 DIAGNOSIS — Z01419 Encounter for gynecological examination (general) (routine) without abnormal findings: Secondary | ICD-10-CM | POA: Diagnosis not present

## 2023-09-20 DIAGNOSIS — N6011 Diffuse cystic mastopathy of right breast: Secondary | ICD-10-CM

## 2023-09-20 NOTE — Patient Instructions (Signed)

## 2023-09-20 NOTE — Telephone Encounter (Signed)
 [facility].   This is a follow up that has not been scheduled to date.

## 2023-09-20 NOTE — Telephone Encounter (Signed)
 Per review of EPIC, scheduled for 09/28/23.

## 2023-09-22 LAB — CYTOLOGY - PAP
Comment: NEGATIVE
High risk HPV: NEGATIVE

## 2023-09-23 ENCOUNTER — Ambulatory Visit: Payer: Self-pay | Admitting: Obstetrics and Gynecology

## 2023-09-23 ENCOUNTER — Encounter: Payer: Self-pay | Admitting: Internal Medicine

## 2023-09-23 ENCOUNTER — Ambulatory Visit (INDEPENDENT_AMBULATORY_CARE_PROVIDER_SITE_OTHER): Admitting: Internal Medicine

## 2023-09-23 VITALS — BP 112/68 | HR 72 | Temp 98.2°F | Ht 65.0 in | Wt 178.0 lb

## 2023-09-23 DIAGNOSIS — E538 Deficiency of other specified B group vitamins: Secondary | ICD-10-CM

## 2023-09-23 DIAGNOSIS — R87622 Low grade squamous intraepithelial lesion on cytologic smear of vagina (LGSIL): Secondary | ICD-10-CM

## 2023-09-23 DIAGNOSIS — M5431 Sciatica, right side: Secondary | ICD-10-CM | POA: Diagnosis not present

## 2023-09-23 DIAGNOSIS — J301 Allergic rhinitis due to pollen: Secondary | ICD-10-CM

## 2023-09-23 DIAGNOSIS — L500 Allergic urticaria: Secondary | ICD-10-CM | POA: Diagnosis not present

## 2023-09-23 DIAGNOSIS — J4520 Mild intermittent asthma, uncomplicated: Secondary | ICD-10-CM

## 2023-09-23 DIAGNOSIS — M6740 Ganglion, unspecified site: Secondary | ICD-10-CM | POA: Diagnosis not present

## 2023-09-23 DIAGNOSIS — M5430 Sciatica, unspecified side: Secondary | ICD-10-CM | POA: Insufficient documentation

## 2023-09-23 NOTE — Progress Notes (Signed)
 Subjective:  Patient ID: Regina [name], female    DOB: 09-19-1965  Age: 58 y.o. MRN: [id number]  CC: Hospitalization Follow-up   HPI Regina [name] presents for rash - resolved Pt went to the beach C/o R leg pain and swelling - ER visit 09/19/23 (-) for DVT C/o R sciatica sx's  Outpatient Medications Prior to Visit  Medication Sig Dispense Refill   Ascorbic Acid (VITAMIN C PO) Take by mouth.     cefadroxil (DURICEF) 500 MG capsule Take 1 capsule (500 mg total) by mouth 2 (two) times daily. 10 capsule 0   Cholecalciferol  (VITAMIN D3) 125 MCG (5000 UT) TABS Take 10,000 Units by mouth daily.     cyanocobalamin  (,VITAMIN B-12,) 1000 MCG/ML injection Inject into the muscle.     famotidine  (PEPCID ) 40 MG tablet Take 1 tablet (40 mg total) by mouth daily as needed for heartburn or indigestion. 30 tablet 5   Magnesium  400 MG TABS Take 1 tablet by mouth daily.     Omega-3 Fatty Acids (FISH OIL PO) Take by mouth.     Probiotic Product (PROBIOTIC PO) Take by mouth.     SYRINGE-NEEDLE, DISP, 3 ML (BD ECLIPSE SYRINGE) 25G X 1 3 ML MISC For SQ Vit B12 shots 50 each 3   triamcinolone  cream (KENALOG ) 0.5 % Apply 1 Application topically 3 (three) times daily. 90 g 1   Turmeric 500 MG TABS Take by mouth 2 (two) times daily.     VITAMIN K PO Take 1 tablet by mouth every other day.     ZINC GLUCONATE PO Take 1 tablet by mouth daily.     doxycycline  (VIBRA -TABS) 100 MG tablet Take 1 tablet (100 mg total) by mouth 2 (two) times daily. (Patient not taking: Reported on 09/23/2023) 20 tablet 0   No facility-administered medications prior to visit.    ROS: Review of Systems  Constitutional:  Negative for activity change, appetite change, chills, fatigue and unexpected weight change.  HENT:  Negative for congestion, mouth sores and sinus pressure.   Eyes:  Negative for visual disturbance.  Respiratory:  Negative for cough and chest tightness.   Gastrointestinal:  Negative for abdominal pain and  nausea.  Genitourinary:  Negative for difficulty urinating, flank pain, frequency and vaginal pain.  Musculoskeletal:  Positive for arthralgias, back pain and gait problem.  Skin:  Negative for pallor and rash.  Neurological:  Negative for dizziness, tremors, weakness, numbness and headaches.  Psychiatric/Behavioral:  Negative for confusion and sleep disturbance.     Objective:  BP 112/68   Pulse 72   Temp 98.2 F (36.8 C) (Oral)   Ht 5' 5 (1.651 m)   Wt 178 lb (80.7 kg)   SpO2 99%   BMI 29.62 kg/m   BP Readings from Last 3 Encounters:  09/23/23 112/68  09/20/23 116/76  09/19/23 119/84    Wt Readings from Last 3 Encounters:  09/23/23 178 lb (80.7 kg)  09/20/23 175 lb (79.4 kg)  08/30/23 175 lb (79.4 kg)    Physical Exam Constitutional:      General: She is not in acute distress.    Appearance: She is well-developed.  HENT:     Head: Normocephalic.     Right Ear: External ear normal.     Left Ear: External ear normal.     Nose: Nose normal.   Eyes:     General:        Right eye: No discharge.  Left eye: No discharge.     Conjunctiva/sclera: Conjunctivae normal.     Pupils: Pupils are equal, round, and reactive to light.   Neck:     Thyroid : No thyromegaly.     Vascular: No JVD.     Trachea: No tracheal deviation.   Cardiovascular:     Rate and Rhythm: Normal rate and regular rhythm.     Heart sounds: Normal heart sounds.  Pulmonary:     Effort: No respiratory distress.     Breath sounds: No stridor. No wheezing.  Abdominal:     General: Bowel sounds are normal. There is no distension.     Palpations: Abdomen is soft. There is no mass.     Tenderness: There is no abdominal tenderness. There is no guarding or rebound.   Musculoskeletal:        General: Tenderness present.     Cervical back: Normal range of motion and neck supple. No rigidity.     Right lower leg: Edema present.     Left lower leg: No edema.  Lymphadenopathy:     Cervical: No  cervical adenopathy.   Skin:    Findings: No erythema or rash.   Neurological:     Mental Status: She is oriented to person, place, and time.     Cranial Nerves: No cranial nerve deficit.     Motor: No abnormal muscle tone.     Coordination: Coordination normal.     Deep Tendon Reflexes: Reflexes normal.   Psychiatric:        Behavior: Behavior normal.        Thought Content: Thought content normal.        Judgment: Judgment normal.     R foot ganglion cyst w/local swelling/pain R Ls spine w/pain  Lab Results  Component Value Date   WBC 7.2 09/19/2023   HGB 12.6 09/19/2023   HCT 37.9 09/19/2023   PLT 369 09/19/2023   GLUCOSE 86 09/19/2023   CHOL 203 (H) 07/31/2020   TRIG 126.0 07/31/2020   HDL 54.70 07/31/2020   LDLCALC 123 (H) 07/31/2020   ALT 15 09/29/2022   AST 16 09/29/2022   NA 143 09/19/2023   K 3.6 09/19/2023   CL 104 09/19/2023   CREATININE 0.80 09/19/2023   BUN 17 09/19/2023   CO2 26 09/19/2023   TSH 1.82 09/29/2022   INR 1.16 06/29/2015   HGBA1C 5.4 08/23/2018    US  Venous Img Lower Right (DVT Study) Result Date: 09/19/2023 CLINICAL DATA:  Right hip/buttock pain radiating down the leg. EXAM: RIGHT LOWER EXTREMITY VENOUS DOPPLER ULTRASOUND TECHNIQUE: Gray-scale sonography with graded compression, as well as color Doppler and duplex ultrasound were performed to evaluate the lower extremity deep venous systems from the level of the common femoral vein and including the common femoral, femoral, profunda femoral, popliteal and calf veins including the posterior tibial, peroneal and gastrocnemius veins when visible. The superficial great saphenous vein was also interrogated. Spectral Doppler was utilized to evaluate flow at rest and with distal augmentation maneuvers in the common femoral, femoral and popliteal veins. COMPARISON:  None Available. FINDINGS: Contralateral Common Femoral Vein: Respiratory phasicity is normal and symmetric with the symptomatic side. No  evidence of thrombus. Normal compressibility. Common Femoral Vein: No evidence of thrombus. Normal compressibility, respiratory phasicity and response to augmentation. Saphenofemoral Junction: No evidence of thrombus. Normal compressibility and flow on color Doppler imaging. Profunda Femoral Vein: No evidence of thrombus. Normal compressibility and flow on color Doppler imaging. Femoral Vein:  No evidence of thrombus. Normal compressibility, respiratory phasicity and response to augmentation. Popliteal Vein: No evidence of thrombus. Normal compressibility, respiratory phasicity and response to augmentation. Calf Veins: No evidence of thrombus. Normal compressibility and flow on color Doppler imaging. Superficial Great Saphenous Vein: No evidence of thrombus. Normal compressibility. Venous Reflux:  None. Other Findings:  None. IMPRESSION: No evidence of deep venous thrombosis within the RIGHT lower extremity. Electronically Signed   By: Suzen Dials M.D.   On: 09/19/2023 18:37    Assessment & Plan:   Problem List Items Addressed This Visit     B12 deficiency   On B12      Allergic urticaria   Resolved Exclusion diet discussed Food allergy IgE and IgG tests were discussed (2013)      Asthmatic bronchitis   Exclusion diet discussed Food allergy IgE and IgG tests were discussed (2013)      Allergic rhinitis   Exclusion diet discussed Food allergy IgE and IgG tests were discussed (2013)      Sciatica - Primary   R leg x 6 months, worse MRI LS spine      Relevant Orders   MR LUMBAR SPINE WO CONTRAST   Ganglion cyst   R foot Options to treat were discussed - not interested at the moment         No orders of the defined types were placed in this encounter.     Follow-up: Return in about 6 weeks (around 11/04/2023) for a follow-up visit.  Marolyn Noel, MD

## 2023-09-23 NOTE — Assessment & Plan Note (Addendum)
 R foot Options to treat were discussed - not interested at the moment

## 2023-09-23 NOTE — Assessment & Plan Note (Addendum)
 Exclusion diet discussed Food allergy IgE and IgG tests were discussed (2013)

## 2023-09-23 NOTE — Assessment & Plan Note (Signed)
 Exclusion diet discussed Food allergy IgE and IgG tests were discussed (2013)

## 2023-09-23 NOTE — Assessment & Plan Note (Addendum)
 R leg x 6 months, worse MRI LS spine

## 2023-09-23 NOTE — Assessment & Plan Note (Signed)
 On B12

## 2023-09-23 NOTE — Assessment & Plan Note (Addendum)
 Resolved Exclusion diet discussed Food allergy IgE and IgG tests were discussed (2013)

## 2023-09-25 ENCOUNTER — Other Ambulatory Visit

## 2023-09-26 ENCOUNTER — Ambulatory Visit
Admission: RE | Admit: 2023-09-26 | Discharge: 2023-09-26 | Disposition: A | Discharge status: Home / Self Care | Source: Ambulatory Visit | Attending: Internal Medicine | Admitting: Internal Medicine

## 2023-09-26 DIAGNOSIS — M5431 Sciatica, right side: Secondary | ICD-10-CM

## 2023-09-27 ENCOUNTER — Ambulatory Visit: Payer: Self-pay | Admitting: Internal Medicine

## 2023-09-28 ENCOUNTER — Encounter

## 2023-09-28 ENCOUNTER — Other Ambulatory Visit

## 2023-10-08 ENCOUNTER — Ambulatory Visit

## 2023-10-08 ENCOUNTER — Ambulatory Visit
Admission: RE | Admit: 2023-10-08 | Discharge: 2023-10-08 | Disposition: A | Discharge status: Home / Self Care | Source: Ambulatory Visit | Attending: Obstetrics and Gynecology | Admitting: Obstetrics and Gynecology

## 2023-10-08 DIAGNOSIS — N6011 Diffuse cystic mastopathy of right breast: Secondary | ICD-10-CM

## 2023-10-10 ENCOUNTER — Ambulatory Visit: Payer: Self-pay | Admitting: Obstetrics and Gynecology

## 2023-10-11 ENCOUNTER — Ambulatory Visit (INDEPENDENT_AMBULATORY_CARE_PROVIDER_SITE_OTHER): Admitting: Internal Medicine

## 2023-10-11 ENCOUNTER — Encounter: Payer: Self-pay | Admitting: Internal Medicine

## 2023-10-11 VITALS — BP 98/70 | HR 67 | Temp 98.2°F | Ht 65.0 in | Wt 174.0 lb

## 2023-10-11 DIAGNOSIS — J310 Chronic rhinitis: Secondary | ICD-10-CM | POA: Diagnosis not present

## 2023-10-11 DIAGNOSIS — G8929 Other chronic pain: Secondary | ICD-10-CM | POA: Diagnosis not present

## 2023-10-11 DIAGNOSIS — R21 Rash and other nonspecific skin eruption: Secondary | ICD-10-CM

## 2023-10-11 DIAGNOSIS — M5441 Lumbago with sciatica, right side: Secondary | ICD-10-CM

## 2023-10-11 MED ORDER — KETOCONAZOLE 2 % EX CREA: 1.0000 | TOPICAL_CREAM | Freq: Every day | CUTANEOUS | 1 refills | Status: AC

## 2023-10-11 NOTE — Assessment & Plan Note (Signed)
 R upper/medial  buttock residual rash/postinflammatory changes.  No signs of pilonidal cyst, psoriasis etc. Derm referral was made

## 2023-10-11 NOTE — Progress Notes (Unsigned)
 Subjective:  Patient ID: Regina [name], female    DOB: 12/07/1965  Age: 58 y.o. MRN: [id number]  CC: Follow-up   HPI Regina [name] presents for R LS spine pain, ongoing right upper buttock discomfort, more discomfort in the R leg (leg feels heavy, tingly) C/o a spot on the R buttock   Outpatient Medications Prior to Visit  Medication Sig Dispense Refill   Ascorbic Acid (VITAMIN C PO) Take by mouth.     cefadroxil  (DURICEF) 500 MG capsule Take 1 capsule (500 mg total) by mouth 2 (two) times daily. 10 capsule 0   Cholecalciferol  (VITAMIN D3) 125 MCG (5000 UT) TABS Take 10,000 Units by mouth daily.     cyanocobalamin  (,VITAMIN B-12,) 1000 MCG/ML injection Inject into the muscle.     famotidine  (PEPCID ) 40 MG tablet Take 1 tablet (40 mg total) by mouth daily as needed for heartburn or indigestion. 30 tablet 5   Magnesium  400 MG TABS Take 1 tablet by mouth daily.     Omega-3 Fatty Acids (FISH OIL PO) Take by mouth.     Probiotic Product (PROBIOTIC PO) Take by mouth.     SYRINGE-NEEDLE, DISP, 3 ML (BD ECLIPSE SYRINGE) 25G X 1 3 ML MISC For SQ Vit B12 shots 50 each 3   triamcinolone  cream (KENALOG ) 0.5 % Apply 1 Application topically 3 (three) times daily. 90 g 1   Turmeric 500 MG TABS Take by mouth 2 (two) times daily.     VITAMIN K PO Take 1 tablet by mouth every other day.     ZINC GLUCONATE PO Take 1 tablet by mouth daily.     doxycycline  (VIBRA -TABS) 100 MG tablet Take 1 tablet (100 mg total) by mouth 2 (two) times daily. (Patient not taking: Reported on 10/11/2023) 20 tablet 0   No facility-administered medications prior to visit.    ROS: Review of Systems  Constitutional:  Negative for activity change, appetite change, chills, fatigue and unexpected weight change.  HENT:  Negative for congestion, mouth sores and sinus pressure.   Eyes:  Negative for visual disturbance.  Respiratory:  Negative for cough and chest tightness.   Gastrointestinal:  Negative for abdominal pain  and nausea.  Genitourinary:  Negative for difficulty urinating, frequency and vaginal pain.  Musculoskeletal:  Positive for arthralgias, back pain and gait problem.  Skin:  Positive for color change. Negative for pallor and rash.  Neurological:  Negative for dizziness, tremors, weakness, numbness and headaches.  Psychiatric/Behavioral:  Positive for decreased concentration. Negative for confusion and sleep disturbance. The patient is nervous/anxious.     Objective:  BP 98/70   Pulse 67   Temp 98.2 F (36.8 C) (Oral)   Ht 5' 5 (1.651 m)   Wt 174 lb (78.9 kg)   SpO2 98%   BMI 28.96 kg/m   BP Readings from Last 3 Encounters:  10/11/23 98/70  09/23/23 112/68  09/20/23 116/76    Wt Readings from Last 3 Encounters:  10/11/23 174 lb (78.9 kg)  09/23/23 178 lb (80.7 kg)  09/20/23 175 lb (79.4 kg)    Physical Exam Constitutional:      General: She is not in acute distress.    Appearance: She is well-developed.  HENT:     Head: Normocephalic.     Right Ear: External ear normal.     Left Ear: External ear normal.     Nose: Nose normal.  Eyes:     General:  Right eye: No discharge.        Left eye: No discharge.     Conjunctiva/sclera: Conjunctivae normal.     Pupils: Pupils are equal, round, and reactive to light.  Neck:     Thyroid : No thyromegaly.     Vascular: No JVD.     Trachea: No tracheal deviation.  Cardiovascular:     Rate and Rhythm: Normal rate and regular rhythm.     Heart sounds: Normal heart sounds.  Pulmonary:     Effort: No respiratory distress.     Breath sounds: No stridor. No wheezing.  Abdominal:     General: Bowel sounds are normal. There is no distension.     Palpations: Abdomen is soft. There is no mass.     Tenderness: There is no abdominal tenderness. There is no guarding or rebound.  Musculoskeletal:        General: Tenderness present.     Cervical back: Normal range of motion and neck supple. No rigidity.     Right lower leg: No  edema.     Left lower leg: No edema.  Lymphadenopathy:     Cervical: No cervical adenopathy.  Skin:    Findings: No erythema or rash.  Neurological:     Cranial Nerves: No cranial nerve deficit.     Motor: No abnormal muscle tone.     Coordination: Coordination normal.     Deep Tendon Reflexes: Reflexes normal.  Psychiatric:        Behavior: Behavior normal.        Thought Content: Thought content normal.        Judgment: Judgment normal.    Right upper and middle gluteus is tender There is a residual discolored skin patch on the right upper gluteus is present.  No drainage.  No obvious swelling or cyst.  No visible tracts present Straight leg elevation is negative  Lab Results  Component Value Date   WBC 7.2 09/19/2023   HGB 12.6 09/19/2023   HCT 37.9 09/19/2023   PLT 369 09/19/2023   GLUCOSE 86 09/19/2023   CHOL 203 (H) 07/31/2020   TRIG 126.0 07/31/2020   HDL 54.70 07/31/2020   LDLCALC 123 (H) 07/31/2020   ALT 15 09/29/2022   AST 16 09/29/2022   NA 143 09/19/2023   K 3.6 09/19/2023   CL 104 09/19/2023   CREATININE 0.80 09/19/2023   BUN 17 09/19/2023   CO2 26 09/19/2023   TSH 1.82 09/29/2022   INR 1.16 06/29/2015   HGBA1C 5.4 08/23/2018    MM 3D DIAGNOSTIC MAMMOGRAM BILATERAL BREAST Result Date: 10/08/2023 CLINICAL DATA:  58 year old female here for delayed follow-up after probably benign biopsy of the right breast which was benign fibrocystic changes. EXAM: DIGITAL DIAGNOSTIC BILATERAL MAMMOGRAM WITH TOMOSYNTHESIS AND CAD TECHNIQUE: Bilateral digital diagnostic mammography and breast tomosynthesis was performed. The images were evaluated with computer-aided detection. COMPARISON:  Previous exam(s). ACR Breast Density Category c: The breasts are heterogeneously dense, which may obscure small masses. FINDINGS: Stable biopsy clip in the right medial upper breast middle depth. No suspicious microcalcifications, masses or distortion seen in either breast. IMPRESSION: No  mammographic evidence of malignancy in either breast. RECOMMENDATION: Routine annual screening mammogram in one year. I have discussed the findings and recommendations with the patient. If applicable, a reminder letter will be sent to the patient regarding the next appointment. BI-RADS CATEGORY  2: Benign. Electronically Signed   By: Rosina Gelineau M.D.   On: 10/08/2023 09:42  Assessment & Plan:   Problem List Items Addressed This Visit     Low back pain   Unchanged. 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 Claudene      Scaly patch rash - Primary   R upper/medial  buttock residual rash/postinflammatory changes.  No signs of pilonidal cyst, psoriasis etc. Derm referral was made      Relevant Orders   Ambulatory referral to Dermatology   Rhinitis, chronic   C/o chronic ST ENT ref was made       Relevant Orders   Ambulatory referral to ENT      Meds ordered this encounter  Medications   ketoconazole  (NIZORAL ) 2 % cream    Sig: Apply 1 Application topically daily.    Dispense:  30 g    Refill:  1      Follow-up: Return in about 3 months (around 01/11/2024) for a follow-up visit.  Marolyn Noel, MD

## 2023-10-11 NOTE — Assessment & Plan Note (Signed)
 C/o chronic ST ENT ref

## 2023-10-13 ENCOUNTER — Ambulatory Visit: Admitting: Podiatry

## 2023-10-13 ENCOUNTER — Encounter: Payer: Self-pay | Admitting: Podiatry

## 2023-10-13 VITALS — Ht 65.0 in | Wt 174.0 lb

## 2023-10-13 DIAGNOSIS — M216X1 Other acquired deformities of right foot: Secondary | ICD-10-CM | POA: Diagnosis not present

## 2023-10-13 DIAGNOSIS — D2371 Other benign neoplasm of skin of right lower limb, including hip: Secondary | ICD-10-CM

## 2023-10-13 NOTE — Assessment & Plan Note (Signed)
 Unchanged. 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 Claudene

## 2023-10-14 NOTE — Progress Notes (Signed)
 Chief Complaint  Patient presents with   Foot Pain    Pt is here due to knot on right foot she states it is a ganglion cyst was seen in the ED for this issue and did not want them to drain it, also complains of callous on the bottom of her foot she would like it shaved.    HPI: 58 y.o. female presenting today for follow-up evaluation of a symptomatic callus to the plantar aspect of the third MTP joint right foot.  Patient was last seen in the office 10/15/2021.  Slowly she states that the callus has returned.  It is becoming symptomatic.  She presents for further treatment and evaluation  Past Medical History:  Diagnosis Date   Allergic urticaria    ARTHRALGIA    Arthritis    Atrial fibrillation (HCC)    B12 DEFICIENCY    CFS (chronic fatigue syndrome)    Endometriosis    Epidural hemorrhage with loss of consciousness (HCC) 06/29/2015   Fibroid    Fibromyalgia    Fracture of cervical vertebra, C6 (HCC) 06/29/2015   Gastritis    GERD    Heart murmur    History of endometriosis    IBS (irritable bowel syndrome)    Internal hemorrhoids    MVP (mitral valve prolapse)    Osteoarthritis    Rosacea    Skin cancer of arm, right    Thoracic compression fracture (HCC) 06/29/2015   T2-T6   VAIN I (vaginal intraepithelial neoplasia grade I)     Past Surgical History:  Procedure Laterality Date   ABDOMINAL HYSTERECTOMY     partial   ABDOMINAL HYSTERECTOMY     ANAL FISSURE REPAIR     Dr Lorriane 2009   APPENDECTOMY     COLPOSCOPY     HEMORRHOID SURGERY     Dr. Lorriane 2009   OVARIAN CYST REMOVAL     RT BUNIONECTOMT     TONSILLECTOMY AND ADENOIDECTOMY     TUBAL LIGATION     UTERINE FIBROID SURGERY      Allergies  Allergen Reactions   Indocin [Indomethacin] Swelling, Rash and Other (See Comments)    REACTION: lupus like illness; butterfly rash, pain and swelling all over body.    Morphine Sulfate Nausea And Vomiting   Sulfacetamide Sodium Swelling   Caffeine Other  (See Comments)    Induces A-fib   Morphine And Codeine      nausea   Sulfa Antibiotics Swelling   Erythromycin Stearate Hives and Swelling    CAN TAKE Z-PAK     Physical Exam: General: The patient is alert and oriented x3 in no acute distress.  Dermatology: Persistent hyperkeratotic tissue noted with a central nucleated core to the plantar aspect of the third MTP of the right foot.  Vascular: Palpable pedal pulses bilaterally. Capillary refill within normal limits.  Negative for any significant edema or erythema  Neurological: Light touch and protective threshold grossly intact  Musculoskeletal Exam: Plantarflexed third metatarsal noted with associated tenderness to palpation to the plantar aspect of the third MTP and associated skin lesion.  Radiographic Exam 09/15/2021 RT foot:  Normal osseous mineralization.  Orthopedic screw noted to the distal portion of the fourth metatarsal which appears intact and stable as well as K wire fixation to the first metatarsal.  The second and third metatarsals extend beyond the metatarsal parabola of the foot which is likely causing the pressure calluses.  Skin marker was placed over the pressure callus of the  foot which directly correlates to third metatarsal head.   Assessment: 1.  Porokeratosis plantar third MTP right foot 2.  Plantarflexed metatarsal third right   Plan of Care:  -Patient evaluated.  -Light excisional debridement of the porokeratosis was performed using a 312 scalpel without incident or bleeding.  Patient did feel significant relief.  -Unfortunate the patient continues to have pain and tenderness which has been chronic and ongoing now for several years to the plantar aspect of the right forefoot.  I do believe it is appropriate at this time to discuss possible surgical intervention to alleviate the patient's symptoms since conservative care and management has failed.  To offload pressure specifically from the plantarflexed third  metatarsal and underlying skin lesion recommend a floating metatarsal osteotomy to elevate the metatarsal head and alleviate pressure from the skin lesion.  Also recommend excision of the skin lesion intraoperatively.  Risk benefits advantages and disadvantages of the procedure were explained in length in detail with the patient.  Postoperative recovery course was also explained.  No guarantees were expressed or implied.  After discussion with the patient she is amenable to this plan. -Authorization for surgery was initiated today.  Surgery will consist of third metatarsal floating osteotomy right.  Excision of benign skin lesion right. -Return to clinic 1 week postop     Thresa EMERSON Sar, DPM [facility]  Dr. Thresa EMERSON Sar, DPM    [address] [place], [place] [address]                Office [phone]  Fax [phone]

## 2023-10-15 ENCOUNTER — Telehealth: Payer: Self-pay | Admitting: Podiatry

## 2023-10-15 NOTE — Telephone Encounter (Signed)
 Patient reports that after attempting to push fluid from the ganglion cyst as instructed, she experiences nausea and a sensation of something moving up the back of her leg. She was seen on Wednesday for a ganglion cyst rupture and is inquiring whether these symptoms are normal. Please advise, thank you  She uses the follow pharmacy if needed:   [id number] - [place], [place] - [address] Phone: [phone]  Fax: [phone]

## 2023-10-18 ENCOUNTER — Telehealth: Payer: Self-pay | Admitting: Podiatry

## 2023-10-18 NOTE — Telephone Encounter (Signed)
 Called pt to get scheduled for surgery and she is scheduled for 11/04/23 but was asking if why she is there could you excise the ganglion cyst that you drained in the office on her right foot. It has come back. I did explain she may have to come back in for an updated consent if he is wanting to proceed with the additional procedure.  She is ok with that if needed. Please advise   Pharmacy correct in chart and not on blood thinners. She did discuss taking a natural alternative to the glp 1 medications at the appt and you advised her what she needed to do.

## 2023-10-20 NOTE — Telephone Encounter (Signed)
 Notified pt of what Dr Janit said that they will discuss morning of surgery and can be added on if needed.  She said thank you.

## 2023-10-21 ENCOUNTER — Other Ambulatory Visit: Payer: Self-pay | Admitting: Physician Assistant

## 2023-10-21 DIAGNOSIS — R490 Dysphonia: Secondary | ICD-10-CM | POA: Insufficient documentation

## 2023-10-21 DIAGNOSIS — E041 Nontoxic single thyroid nodule: Secondary | ICD-10-CM

## 2023-10-27 ENCOUNTER — Telehealth: Payer: Self-pay | Admitting: Podiatry

## 2023-10-27 NOTE — Telephone Encounter (Signed)
 DOS- 11/04/2023  3RD METATARSAL OSTEOTOMY RT- 71691 EXC BENIGN LESION 2.0CM RT- 11422  [employer] EFFECTIVE DATE- 03/31/2023  DEDUCTIBLE- $1500 REMAINING- $0 FAMILY DEDUCTIBLE- $4500 REMAINING- $3000 OOP- $5900 REMAINING- $3051.25 FAMILY OOP- $16300 REMAINING- $87063.13 COINSURANCE- 30%  PER DYLAN P WITH [employer], NO PRIOR AUTHS ARE REQUIRED FOR CPT CODES 71691 AND 11422. REF# [id number]

## 2023-11-01 ENCOUNTER — Encounter: Admitting: Obstetrics and Gynecology

## 2023-11-01 NOTE — Progress Notes (Deleted)
 GYNECOLOGY  VISIT   HPI: 58 y.o.   Married  Caucasian female   [phone] with No LMP recorded. Patient has had a hysterectomy.   here for: Colposcopy      GYNECOLOGIC HISTORY: No LMP recorded. Patient has had a hysterectomy. Contraception:  Hyst  Menopausal hormone therapy:  n/a Last 2 paps:  09/20/23 LSIL, HR HPV neg, 09/16/22 ASCUS, HR HPV neg  History of abnormal Pap or positive HPV:  yes Mammogram:  10/08/23 Breast Density Cat C, BIRADS Cat 2 benign         OB History     Gravida  3   Para  2   Term  2   Preterm  0   AB  1   Living  2      SAB  1   IAB  0   Ectopic  0   Multiple      Live Births  2              Patient Active Problem List   Diagnosis Date Noted   Sciatica 09/23/2023   Ganglion cyst 09/23/2023   Contact dermatitis 08/30/2023   Skin lesion 08/30/2023   Near syncope 02/02/2023   PTSD (post-traumatic stress disorder) 02/02/2023   Edema 09/29/2022   Callus of foot 09/29/2022   Acute respiratory infection 02/23/2022   Allergic rhinitis 09/18/2021   Sinusitis 08/21/2021   Umbilical hernia with obstruction, without gangrene 03/08/2021   Herpes zoster 08/12/2020   Memory difficulties 07/23/2020   Right elbow pain 07/09/2020   Patellofemoral syndrome of right knee 12/21/2019   Acute medial meniscal tear, right, initial encounter 12/21/2019   Stress 10/24/2019   Wrist tendonitis 10/24/2019   Vitamin D  deficiency 05/03/2019   Multiple thyroid  nodules 03/08/2019   Oropharyngeal dysphagia 03/08/2019   Constipation 12/26/2018   Brachial neuritis of right upper extremity 07/06/2018   Panic attacks 10/20/2016   Loss of transverse plantar arch of right foot 09/07/2016   Aphasia 03/13/2016   Post concussion syndrome 11/01/2015   Cervical radicular pain 09/20/2015   Pain in joint, shoulder region 09/11/2015   Skull fracture with concussion (HCC) 07/30/2015   Fracture of cervical vertebra, C6 (HCC) 07/30/2015   Thoracic compression  fracture (HCC) 07/23/2015   Closed fracture of base of fourth metacarpal bone of left hand 06/30/2015   Fibromyalgia 06/18/2015   Rhinitis, chronic 06/18/2015   Scaly patch rash 02/26/2015   Mitral valve regurgitation 09/27/2014   PAT (paroxysmal atrial tachycardia) (HCC) 09/06/2014   Asthmatic bronchitis 05/08/2013   Benign paroxysmal positional vertigo 05/08/2013   Insomnia 01/10/2013   Depression 01/10/2013   Snoring 08/25/2012   IBS (irritable bowel syndrome) 02/08/2012   Multiple pulmonary nodules 02/08/2012   Somatic dysfunction of spine, thoracic 11/16/2010   Gastro-esophageal reflux disease without esophagitis 05/24/2010   SINUSITIS, MAXILLARY, CHRONIC 05/20/2010   Fatigue 11/20/2009   Low back pain 02/20/2009   Rosacea 12/27/2008   Allergic urticaria 12/27/2008   B12 deficiency 01/02/2008    Past Medical History:  Diagnosis Date   Allergic urticaria    ARTHRALGIA    Arthritis    Atrial fibrillation (HCC)    B12 DEFICIENCY    CFS (chronic fatigue syndrome)    Endometriosis    Epidural hemorrhage with loss of consciousness (HCC) 06/29/2015   Fibroid    Fibromyalgia    Fracture of cervical vertebra, C6 (HCC) 06/29/2015   Gastritis    GERD    Heart murmur  History of endometriosis    IBS (irritable bowel syndrome)    Internal hemorrhoids    MVP (mitral valve prolapse)    Osteoarthritis    Rosacea    Skin cancer of arm, right    Thoracic compression fracture (HCC) 06/29/2015   T2-T6   VAIN I (vaginal intraepithelial neoplasia grade I)     Past Surgical History:  Procedure Laterality Date   ABDOMINAL HYSTERECTOMY     partial   ABDOMINAL HYSTERECTOMY     ANAL FISSURE REPAIR     Dr Lorriane 2009   APPENDECTOMY     COLPOSCOPY     HEMORRHOID SURGERY     Dr. Lorriane 2009   OVARIAN CYST REMOVAL     RT BUNIONECTOMT     TONSILLECTOMY AND ADENOIDECTOMY     TUBAL LIGATION     UTERINE FIBROID SURGERY      Current Outpatient Medications  Medication  Sig Dispense Refill   Ascorbic Acid (VITAMIN C PO) Take by mouth.     cefadroxil  (DURICEF) 500 MG capsule Take 1 capsule (500 mg total) by mouth 2 (two) times daily. 10 capsule 0   Cholecalciferol  (VITAMIN D3) 125 MCG (5000 UT) TABS Take 10,000 Units by mouth daily.     cyanocobalamin  (,VITAMIN B-12,) 1000 MCG/ML injection Inject into the muscle.     doxycycline  (VIBRA -TABS) 100 MG tablet Take 1 tablet (100 mg total) by mouth 2 (two) times daily. (Patient not taking: Reported on 10/13/2023) 20 tablet 0   famotidine  (PEPCID ) 40 MG tablet Take 1 tablet (40 mg total) by mouth daily as needed for heartburn or indigestion. 30 tablet 5   ketoconazole  (NIZORAL ) 2 % cream Apply 1 Application topically daily. 30 g 1   Magnesium  400 MG TABS Take 1 tablet by mouth daily.     Omega-3 Fatty Acids (FISH OIL PO) Take by mouth.     Probiotic Product (PROBIOTIC PO) Take by mouth.     SYRINGE-NEEDLE, DISP, 3 ML (BD ECLIPSE SYRINGE) 25G X 1 3 ML MISC For SQ Vit B12 shots 50 each 3   triamcinolone  cream (KENALOG ) 0.5 % Apply 1 Application topically 3 (three) times daily. 90 g 1   Turmeric 500 MG TABS Take by mouth 2 (two) times daily.     VITAMIN K PO Take 1 tablet by mouth every other day.     ZINC GLUCONATE PO Take 1 tablet by mouth daily.     No current facility-administered medications for this visit.     ALLERGIES: Indocin [indomethacin], Morphine sulfate, Sulfacetamide sodium, Caffeine, Morphine and codeine , Sulfa antibiotics, and Erythromycin stearate  Family History  Problem Relation Age of Onset   Lung cancer Mother    Thyroid  cancer Mother    Cancer Mother        INTESTINAL   Healthy Father    Von Willebrand disease Daughter    Breast cancer Paternal Aunt    Diabetes Other        uncles/aunts   Asthma Neg Hx    Stomach cancer Neg Hx    Colon cancer Neg Hx    Esophageal cancer Neg Hx    Pancreatic cancer Neg Hx     Social History   Socioeconomic History   Marital status: Married     Spouse name: Not on file   Number of children: Not on file   Years of education: Not on file   Highest education level: Not on file  Occupational History   Not on file  Tobacco Use   Smoking status: Former    Current packs/day: 0.00    Types: Cigarettes    Quit date: 10/31/1988    Years since quitting: 35.0   Smokeless tobacco: Never  Vaping Use   Vaping status: Never Used  Substance and Sexual Activity   Alcohol use: Yes    Alcohol/week: 1.0 - 2.0 standard drink of alcohol    Types: 1 - 2 Standard drinks or equivalent per week    Comment: occasional    Drug use: No   Sexual activity: Yes    Partners: Male    Birth control/protection: Surgical    Comment: hysterectomy  Other Topics Concern   Not on file  Social History Narrative   ** Merged History Encounter **       Working 2 jobs, 6d/wk      Regular exercise-no      Divorced   Social Drivers of Corporate investment banker Strain: Low Risk  (02/23/2021)   Received from [facility]   Overall Financial Resource Strain (CARDIA)    Difficulty of Paying Living Expenses: Not very hard  Food Insecurity: Unknown (02/23/2021)   Received from [facility]   Hunger Vital Sign    Within the past 12 months, you worried that your food would run out before you got the money to buy more.: Patient declined    Within the past 12 months, the food you bought just didn't last and you didn't have money to get more.: Patient declined  Transportation Needs: Unknown (02/23/2021)   Received from [facility] - Transportation    Lack of Transportation (Medical): Patient declined    Lack of Transportation (Non-Medical): Patient declined  Physical Activity: Insufficiently Active (02/23/2021)   Received from [facility]   Exercise Vital Sign    On average, how many days per week do you engage in moderate to strenuous exercise (like a brisk walk)?: 6 days    On average, how many minutes do you engage in exercise at this  level?: 20 min  Stress: Unknown (02/23/2021)   Received from [facility] - Occupational Stress Questionnaire    Feeling of Stress : Patient declined  Social Connections: Unknown (08/11/2021)   Received from [facility]   Social Network    Social Network: Not on file  Intimate Partner Violence: Unknown (07/03/2021)   Received from [facility]   HITS    Physically Hurt: Not on file    Insult or Talk Down To: Not on file    Threaten Physical Harm: Not on file    Scream or Curse: Not on file    Review of Systems  PHYSICAL EXAMINATION:   There were no vitals taken for this visit.    General appearance: alert, cooperative and appears stated age Head: Normocephalic, without obvious abnormality, atraumatic Neck: no adenopathy, supple, symmetrical, trachea midline and thyroid  normal to inspection and palpation Lungs: clear to auscultation bilaterally Breasts: normal appearance, no masses or tenderness, No nipple retraction or dimpling, No nipple discharge or bleeding, No axillary or supraclavicular adenopathy Heart: regular rate and rhythm Abdomen: soft, non-tender, no masses,  no organomegaly Extremities: extremities normal, atraumatic, no cyanosis or edema Skin: Skin color, texture, turgor normal. No rashes or lesions Lymph nodes: Cervical, supraclavicular, and axillary nodes normal. No abnormal inguinal nodes palpated Neurologic: Grossly normal  Pelvic: External genitalia:  no lesions  Urethra:  normal appearing urethra with no masses, tenderness or lesions              Bartholins and Skenes: normal                 Vagina: normal appearing vagina with normal color and discharge, no lesions              Cervix: no lesions                Bimanual Exam:  Uterus:  normal size, contour, position, consistency, mobility, non-tender              Adnexa: no mass, fullness, tenderness              Rectal exam: {yes no:314532}.   Confirms.              Anus:  normal sphincter tone, no lesions  Chaperone was present for exam:  {BSCHAPERONE:31226::Emily F, CMA}  ASSESSMENT:    PLAN:    {LABS (Optional):23779}  ***  total time was spent for this patient encounter, including preparation, face-to-face counseling with the patient, coordination of care, and documentation of the encounter.

## 2023-11-03 ENCOUNTER — Telehealth: Payer: Self-pay | Admitting: Podiatry

## 2023-11-03 NOTE — Telephone Encounter (Signed)
 Pt was scheduled for surgery tomorrow and rescheduled it to 12/18 due to both her parents having alzheimer's and not doing well and she needs to be able to help them if needed.  I left message for [facility] to reschedule the surgery.

## 2023-11-08 ENCOUNTER — Ambulatory Visit: Admitting: Internal Medicine

## 2023-11-08 ENCOUNTER — Encounter: Payer: Self-pay | Admitting: Internal Medicine

## 2023-11-08 VITALS — BP 122/74 | HR 74 | Temp 98.7°F | Ht 65.0 in | Wt 176.0 lb

## 2023-11-08 DIAGNOSIS — E538 Deficiency of other specified B group vitamins: Secondary | ICD-10-CM | POA: Diagnosis not present

## 2023-11-08 DIAGNOSIS — K219 Gastro-esophageal reflux disease without esophagitis: Secondary | ICD-10-CM | POA: Diagnosis not present

## 2023-11-08 DIAGNOSIS — R21 Rash and other nonspecific skin eruption: Secondary | ICD-10-CM | POA: Diagnosis not present

## 2023-11-08 DIAGNOSIS — G8929 Other chronic pain: Secondary | ICD-10-CM

## 2023-11-08 DIAGNOSIS — M25561 Pain in right knee: Secondary | ICD-10-CM

## 2023-11-08 DIAGNOSIS — M1991 Primary osteoarthritis, unspecified site: Secondary | ICD-10-CM | POA: Diagnosis not present

## 2023-11-08 DIAGNOSIS — R490 Dysphonia: Secondary | ICD-10-CM

## 2023-11-08 DIAGNOSIS — M1990 Unspecified osteoarthritis, unspecified site: Secondary | ICD-10-CM | POA: Insufficient documentation

## 2023-11-08 NOTE — Assessment & Plan Note (Signed)
 GERD causing hoarseness - pt saw ENT Pepcid  prn

## 2023-11-08 NOTE — Assessment & Plan Note (Addendum)
 LBP, B hip pain Start PT - [facility] in [place]

## 2023-11-08 NOTE — Assessment & Plan Note (Signed)
 R knee b. anserina bursitis - new shoes, ice etc

## 2023-11-08 NOTE — Progress Notes (Signed)
 Subjective:  Patient ID: Regina [name], female    DOB: 08-20-1965  Age: 58 y.o. MRN: [id number]  CC: Medical Management of Chronic Issues (6 week f/u )   HPI Regina [name] presents for rash on buttocks rash - pt saw a dermatologist (on Doxy now for ?ingrown hair) F/u hypothyroidism, GERD causing hoarseness - pt saw ENT F/u on B hip pain  Outpatient Medications Prior to Visit  Medication Sig Dispense Refill   Ascorbic Acid (VITAMIN C PO) Take by mouth.     cefadroxil  (DURICEF) 500 MG capsule Take 1 capsule (500 mg total) by mouth 2 (two) times daily. 10 capsule 0   Cholecalciferol  (VITAMIN D3) 125 MCG (5000 UT) TABS Take 10,000 Units by mouth daily.     cyanocobalamin  (,VITAMIN B-12,) 1000 MCG/ML injection Inject into the muscle.     famotidine  (PEPCID ) 40 MG tablet Take 1 tablet (40 mg total) by mouth daily as needed for heartburn or indigestion. 30 tablet 5   ketoconazole  (NIZORAL ) 2 % cream Apply 1 Application topically daily. 30 g 1   Magnesium  400 MG TABS Take 1 tablet by mouth daily.     Omega-3 Fatty Acids (FISH OIL PO) Take by mouth.     Probiotic Product (PROBIOTIC PO) Take by mouth.     SYRINGE-NEEDLE, DISP, 3 ML (BD ECLIPSE SYRINGE) 25G X 1 3 ML MISC For SQ Vit B12 shots 50 each 3   triamcinolone  cream (KENALOG ) 0.5 % Apply 1 Application topically 3 (three) times daily. 90 g 1   Turmeric 500 MG TABS Take by mouth 2 (two) times daily.     VITAMIN K PO Take 1 tablet by mouth every other day.     ZINC GLUCONATE PO Take 1 tablet by mouth daily.     doxycycline  (VIBRA -TABS) 100 MG tablet Take 1 tablet (100 mg total) by mouth 2 (two) times daily. (Patient not taking: Reported on 11/08/2023) 20 tablet 0   No facility-administered medications prior to visit.    ROS: Review of Systems  Constitutional:  Negative for activity change, appetite change, chills, fatigue and unexpected weight change.  HENT:  Negative for congestion, mouth sores and sinus pressure.   Eyes:   Negative for visual disturbance.  Respiratory:  Negative for cough and chest tightness.   Gastrointestinal:  Negative for abdominal pain and nausea.  Genitourinary:  Negative for difficulty urinating, frequency and vaginal pain.  Musculoskeletal:  Positive for arthralgias and back pain. Negative for gait problem.  Skin:  Positive for rash. Negative for pallor.  Neurological:  Negative for dizziness, tremors, weakness, numbness and headaches.  Psychiatric/Behavioral:  Negative for confusion and sleep disturbance. The patient is not nervous/anxious.     Objective:  BP 122/74   Pulse 74   Temp 98.7 F (37.1 C) (Oral)   Ht 5' 5 (1.651 m)   Wt 176 lb (79.8 kg)   SpO2 97%   BMI 29.29 kg/m   BP Readings from Last 3 Encounters:  11/08/23 122/74  10/11/23 98/70  09/23/23 112/68    Wt Readings from Last 3 Encounters:  11/08/23 176 lb (79.8 kg)  10/13/23 174 lb (78.9 kg)  10/11/23 174 lb (78.9 kg)    Physical Exam Constitutional:      General: She is not in acute distress.    Appearance: Normal appearance. She is well-developed.  HENT:     Head: Normocephalic.     Right Ear: External ear normal.     Left Ear: External  ear normal.     Nose: Nose normal.  Eyes:     General:        Right eye: No discharge.        Left eye: No discharge.     Conjunctiva/sclera: Conjunctivae normal.     Pupils: Pupils are equal, round, and reactive to light.  Neck:     Thyroid : No thyromegaly.     Vascular: No JVD.     Trachea: No tracheal deviation.  Cardiovascular:     Rate and Rhythm: Normal rate and regular rhythm.     Heart sounds: Normal heart sounds.  Pulmonary:     Effort: No respiratory distress.     Breath sounds: No stridor. No wheezing.  Abdominal:     General: Bowel sounds are normal. There is no distension.     Palpations: Abdomen is soft. There is no mass.     Tenderness: There is no abdominal tenderness. There is no guarding or rebound.  Musculoskeletal:        General:  Tenderness present.     Cervical back: Normal range of motion and neck supple. No rigidity.     Right lower leg: No edema.     Left lower leg: No edema.  Lymphadenopathy:     Cervical: No cervical adenopathy.  Skin:    Findings: No erythema or rash.  Neurological:     Cranial Nerves: No cranial nerve deficit.     Motor: No abnormal muscle tone.     Coordination: Coordination normal.     Deep Tendon Reflexes: Reflexes normal.  Psychiatric:        Behavior: Behavior normal.        Thought Content: Thought content normal.        Judgment: Judgment normal.   LS spine, B lat hips w/pain  Lab Results  Component Value Date   WBC 7.2 09/19/2023   HGB 12.6 09/19/2023   HCT 37.9 09/19/2023   PLT 369 09/19/2023   GLUCOSE 86 09/19/2023   CHOL 203 (H) 07/31/2020   TRIG 126.0 07/31/2020   HDL 54.70 07/31/2020   LDLCALC 123 (H) 07/31/2020   ALT 15 09/29/2022   AST 16 09/29/2022   NA 143 09/19/2023   K 3.6 09/19/2023   CL 104 09/19/2023   CREATININE 0.80 09/19/2023   BUN 17 09/19/2023   CO2 26 09/19/2023   TSH 1.82 09/29/2022   INR 1.16 06/29/2015   HGBA1C 5.4 08/23/2018    MM 3D DIAGNOSTIC MAMMOGRAM BILATERAL BREAST Result Date: 10/08/2023 CLINICAL DATA:  58 year old female here for delayed follow-up after probably benign biopsy of the right breast which was benign fibrocystic changes. EXAM: DIGITAL DIAGNOSTIC BILATERAL MAMMOGRAM WITH TOMOSYNTHESIS AND CAD TECHNIQUE: Bilateral digital diagnostic mammography and breast tomosynthesis was performed. The images were evaluated with computer-aided detection. COMPARISON:  Previous exam(s). ACR Breast Density Category c: The breasts are heterogeneously dense, which may obscure small masses. FINDINGS: Stable biopsy clip in the right medial upper breast middle depth. No suspicious microcalcifications, masses or distortion seen in either breast. IMPRESSION: No mammographic evidence of malignancy in either breast. RECOMMENDATION: Routine annual  screening mammogram in one year. I have discussed the findings and recommendations with the patient. If applicable, a reminder letter will be sent to the patient regarding the next appointment. BI-RADS CATEGORY  2: Benign. Electronically Signed   By: Rosina Gelineau M.D.   On: 10/08/2023 09:42    Assessment & Plan:   Problem List Items Addressed This Visit  B12 deficiency   On B12      GERD (gastroesophageal reflux disease)   GERD causing hoarseness - pt saw ENT Pepcid  prn      Hoarseness   GERD causing hoarseness - pt saw ENT Pepcid  prn      Knee pain, chronic   R knee b. anserina bursitis - new shoes, ice etc      Osteoarthritis - Primary   LBP, B hip pain Start PT - [facility] in [place]      Relevant Orders   Ambulatory referral to Physical Therapy   Scaly patch rash   Chronic rash on buttocks rash - pt saw a dermatologist (on Doxy now for ?ingrown hair)         No orders of the defined types were placed in this encounter.     Follow-up: Return in about 3 months (around 02/08/2024) for a follow-up visit.  Marolyn Noel, MD

## 2023-11-08 NOTE — Assessment & Plan Note (Signed)
 Chronic rash on buttocks rash - pt saw a dermatologist (on Doxy now for ?ingrown hair)

## 2023-11-08 NOTE — Assessment & Plan Note (Signed)
 On B12

## 2023-11-10 ENCOUNTER — Encounter: Admitting: Podiatry

## 2023-11-17 ENCOUNTER — Encounter: Admitting: Podiatry

## 2023-12-08 ENCOUNTER — Encounter: Admitting: Podiatry

## 2023-12-20 ENCOUNTER — Telehealth: Payer: Self-pay

## 2023-12-20 NOTE — Telephone Encounter (Signed)
 Patient no showed for her colposcopy appt with Dr Nikki on 11-01-23. Message sent to scheduling department to call her to reschedule

## 2023-12-28 ENCOUNTER — Encounter: Payer: Self-pay | Admitting: Obstetrics and Gynecology

## 2024-01-04 ENCOUNTER — Telehealth: Payer: Self-pay | Admitting: Obstetrics and Gynecology

## 2024-01-04 NOTE — Telephone Encounter (Signed)
 Dr Nikki placed order for colposcopy to be scheduled. Two messages were left and letter mailed asking patient to call and schedule appointment; patient has not scheduled.

## 2024-01-13 NOTE — Telephone Encounter (Signed)
 Left message to call Noreene Larsson, RN at [facility], [phone], option 5.

## 2024-01-18 NOTE — Telephone Encounter (Signed)
 Letter was sent 12-28-23 letting patient know she missed an appointment.

## 2024-01-24 NOTE — Telephone Encounter (Signed)
 See open encounter dated 01/04/24.   Will close this encounter.

## 2024-01-31 ENCOUNTER — Ambulatory Visit: Payer: Self-pay | Admitting: *Deleted

## 2024-01-31 DIAGNOSIS — G8929 Other chronic pain: Secondary | ICD-10-CM

## 2024-01-31 NOTE — Telephone Encounter (Signed)
 FYI Only or Action Required?: Action required by provider: referral request.  Patient was last seen in primary care on 11/08/2023 by Plotnikov, Karlynn GAILS, MD.  Called Nurse Triage reporting Referral.  Symptoms began several months ago.  Interventions attempted: Other: testing- referrals to dermatology .  Symptoms are: unchanged.  Triage Disposition: Call PCP When Office is Open  Patient/caregiver understands and will follow disposition?: Yes  Copied from CRM #[id number]. Topic: Clinical - Red Word Triage >> Jan 31, 2024  4:13 PM Alfonso ORN wrote: Red Word that prompted transfer to Nurse Triage: symptoms ongoing (prior appt scheduled )discharge seeping now experiencing worse pain in hip and legs Reason for Disposition  [1] Caller requesting NON-URGENT health information AND [2] PCP's office is the best resource  Answer Assessment - Initial Assessment Questions 1. REASON FOR CALL: What is the main reason for your call? or How can I best help you?     Patient would like a referral to surgeon for evaluation of area on upper buttock- since February.  2. SYMPTOMS : Do you have any symptoms?      Pain, drainage- oozing 3. OTHER QUESTIONS: Do you have any other questions?     Patient states she has had labs, cultures- it is not resolving with any of the treatments- she would like to see surgeon.   Patient also states she had referral for PT- she never was contacted- needs to be in [place]. Patient has been to [facility] - they have a location in [place].  Protocols used: Information Only Call - No Triage-A-AH

## 2024-02-06 NOTE — Addendum Note (Signed)
 Addended by: Jonell Brumbaugh V on: 02/06/2024 11:36 PM   Modules accepted: Orders

## 2024-02-06 NOTE — Telephone Encounter (Signed)
 Ilene  I will refer Dorian to [facility] surgery.  Thank you

## 2024-02-08 NOTE — Telephone Encounter (Signed)
 Letter signed and given to CMA to mail.

## 2024-02-08 NOTE — Telephone Encounter (Signed)
 No response from Patient.  AEX scheduled for 09/20/24  Letter printed and to Dr. Nikki to review and sign to be mailed.   Colpo order discontinued.   Letter pended. Copy to Dr. Nikki to review, to me signed and mailed.

## 2024-02-09 NOTE — Telephone Encounter (Signed)
 Letter also sent via Mychart.   Encounter closed.

## 2024-02-10 ENCOUNTER — Encounter: Payer: Self-pay | Admitting: Internal Medicine

## 2024-02-10 ENCOUNTER — Ambulatory Visit: Admitting: Internal Medicine

## 2024-02-10 VITALS — BP 104/66 | HR 64 | Temp 98.4°F | Ht 65.0 in

## 2024-02-10 DIAGNOSIS — Z0000 Encounter for general adult medical examination without abnormal findings: Secondary | ICD-10-CM

## 2024-02-10 DIAGNOSIS — E559 Vitamin D deficiency, unspecified: Secondary | ICD-10-CM | POA: Diagnosis not present

## 2024-02-10 DIAGNOSIS — J4520 Mild intermittent asthma, uncomplicated: Secondary | ICD-10-CM

## 2024-02-10 DIAGNOSIS — M5441 Lumbago with sciatica, right side: Secondary | ICD-10-CM

## 2024-02-10 DIAGNOSIS — E538 Deficiency of other specified B group vitamins: Secondary | ICD-10-CM | POA: Diagnosis not present

## 2024-02-10 DIAGNOSIS — R21 Rash and other nonspecific skin eruption: Secondary | ICD-10-CM

## 2024-02-10 DIAGNOSIS — M1991 Primary osteoarthritis, unspecified site: Secondary | ICD-10-CM

## 2024-02-10 DIAGNOSIS — G8929 Other chronic pain: Secondary | ICD-10-CM

## 2024-02-10 LAB — VITAMIN B12: Vitamin B-12: 262 pg/mL (ref 211–911)

## 2024-02-10 LAB — VITAMIN D 25 HYDROXY (VIT D DEFICIENCY, FRACTURES): VITD: 23.07 ng/mL — ABNORMAL LOW (ref 30.00–100.00)

## 2024-02-10 LAB — LIPID PANEL
Cholesterol: 217 mg/dL — ABNORMAL HIGH (ref 0–200)
HDL: 63.1 mg/dL (ref >39.00)
LDL Cholesterol: 134 mg/dL — ABNORMAL HIGH (ref 0–99)
NonHDL: 154.01
Total CHOL/HDL Ratio: 3
Triglycerides: 100 mg/dL (ref 0.0–149.0)
VLDL: 20 mg/dL (ref 0.0–40.0)

## 2024-02-10 LAB — CBC WITH DIFFERENTIAL/PLATELET
Basophils Absolute: 0 K/uL (ref 0.0–0.1)
Basophils Relative: 0.1 % (ref 0.0–3.0)
Eosinophils Absolute: 0 K/uL (ref 0.0–0.7)
Eosinophils Relative: 0 % (ref 0.0–5.0)
HCT: 39.8 % (ref 36.0–46.0)
Hemoglobin: 13.3 g/dL (ref 12.0–15.0)
Lymphocytes Relative: 33.4 % (ref 12.0–46.0)
Lymphs Abs: 1.6 K/uL (ref 0.7–4.0)
MCHC: 33.4 g/dL (ref 30.0–36.0)
MCV: 86.9 fl (ref 78.0–100.0)
Monocytes Absolute: 0.3 K/uL (ref 0.1–1.0)
Monocytes Relative: 6.5 % (ref 3.0–12.0)
Neutro Abs: 3 K/uL (ref 1.4–7.7)
Neutrophils Relative %: 60 % (ref 43.0–77.0)
Platelets: 346 K/uL (ref 150.0–400.0)
RBC: 4.58 Mil/uL (ref 3.87–5.11)
RDW: 13.1 % (ref 11.5–15.5)
WBC: 4.9 K/uL (ref 4.0–10.5)

## 2024-02-10 LAB — COMPREHENSIVE METABOLIC PANEL WITH GFR
ALT: 16 U/L (ref 0–35)
AST: 16 U/L (ref 0–37)
Albumin: 4.4 g/dL (ref 3.5–5.2)
Alkaline Phosphatase: 56 U/L (ref 39–117)
BUN: 12 mg/dL (ref 6–23)
CO2: 29 meq/L (ref 19–32)
Calcium: 9.3 mg/dL (ref 8.4–10.5)
Chloride: 104 meq/L (ref 96–112)
Creatinine, Ser: 0.7 mg/dL (ref 0.40–1.20)
GFR: 95.08 mL/min (ref >60.00)
Glucose, Bld: 87 mg/dL (ref 70–99)
Potassium: 4 meq/L (ref 3.5–5.1)
Sodium: 141 meq/L (ref 135–145)
Total Bilirubin: 0.4 mg/dL (ref 0.2–1.2)
Total Protein: 7.2 g/dL (ref 6.0–8.3)

## 2024-02-10 LAB — URINALYSIS
Bilirubin Urine: NEGATIVE
Hgb urine dipstick: NEGATIVE
Ketones, ur: NEGATIVE
Leukocytes,Ua: NEGATIVE
Nitrite: NEGATIVE
Specific Gravity, Urine: 1.01 (ref 1.000–1.030)
Total Protein, Urine: NEGATIVE
Urine Glucose: NEGATIVE
Urobilinogen, UA: 0.2 (ref 0.0–1.0)
pH: 8 (ref 5.0–8.0)

## 2024-02-10 LAB — TSH: TSH: 1.43 u[IU]/mL (ref 0.35–5.50)

## 2024-02-10 NOTE — Assessment & Plan Note (Addendum)
 On B12 SL now

## 2024-02-10 NOTE — Assessment & Plan Note (Addendum)
 Not better Surgical ref to r/o pilonidal cyst (unlikely, but possible)

## 2024-02-10 NOTE — Assessment & Plan Note (Signed)
 Exclusion diet discussed Food allergy IgE and IgG tests were discussed (2013)

## 2024-02-10 NOTE — Assessment & Plan Note (Signed)
 PT is pending

## 2024-02-10 NOTE — Assessment & Plan Note (Signed)
 On vit D

## 2024-02-10 NOTE — Progress Notes (Addendum)
 Subjective:  Patient ID: Regina [name], female    DOB: July 26, 1965  Age: 58 y.o. MRN: [id number]  CC: Medical Management of Chronic Issues (3 Month follow up)   HPI Regina [name] presents for OA, skin lesion, asthma  Outpatient Medications Prior to Visit  Medication Sig Dispense Refill   Ascorbic Acid (VITAMIN C PO) Take by mouth.     cefadroxil  (DURICEF) 500 MG capsule Take 1 capsule (500 mg total) by mouth 2 (two) times daily. 10 capsule 0   Cholecalciferol  (VITAMIN D3) 125 MCG (5000 UT) TABS Take 10,000 Units by mouth daily.     cyanocobalamin  (,VITAMIN B-12,) 1000 MCG/ML injection Inject into the muscle.     famotidine  (PEPCID ) 40 MG tablet Take 1 tablet (40 mg total) by mouth daily as needed for heartburn or indigestion. 30 tablet 5   ketoconazole  (NIZORAL ) 2 % cream Apply 1 Application topically daily. 30 g 1   Magnesium  400 MG TABS Take 1 tablet by mouth daily.     Omega-3 Fatty Acids (FISH OIL PO) Take by mouth.     Probiotic Product (PROBIOTIC PO) Take by mouth.     SYRINGE-NEEDLE, DISP, 3 ML (BD ECLIPSE SYRINGE) 25G X 1 3 ML MISC For SQ Vit B12 shots 50 each 3   triamcinolone  cream (KENALOG ) 0.5 % Apply 1 Application topically 3 (three) times daily. 90 g 1   Turmeric 500 MG TABS Take by mouth 2 (two) times daily.     VITAMIN K PO Take 1 tablet by mouth every other day.     ZINC GLUCONATE PO Take 1 tablet by mouth daily.     doxycycline  (VIBRA -TABS) 100 MG tablet Take 1 tablet (100 mg total) by mouth 2 (two) times daily. (Patient not taking: Reported on 02/10/2024) 20 tablet 0   No facility-administered medications prior to visit.    ROS: Review of Systems  Constitutional:  Negative for activity change, appetite change, chills, fatigue and unexpected weight change.  HENT:  Negative for congestion, mouth sores and sinus pressure.   Eyes:  Negative for visual disturbance.  Respiratory:  Negative for cough and chest tightness.   Gastrointestinal:  Negative for  abdominal pain and nausea.  Genitourinary:  Negative for difficulty urinating, frequency and vaginal pain.  Musculoskeletal:  Positive for arthralgias and back pain. Negative for gait problem.  Skin:  Negative for pallor and rash.  Neurological:  Negative for dizziness, tremors, weakness, numbness and headaches.  Psychiatric/Behavioral:  Negative for confusion, sleep disturbance and suicidal ideas.     Objective:  BP 104/66   Pulse 64   Temp 98.4 F (36.9 C)   Ht 5' 5 (1.651 m)   SpO2 98%   BMI 29.29 kg/m   BP Readings from Last 3 Encounters:  02/10/24 104/66  11/08/23 122/74  10/11/23 98/70    Wt Readings from Last 3 Encounters:  11/08/23 176 lb (79.8 kg)  10/13/23 174 lb (78.9 kg)  10/11/23 174 lb (78.9 kg)    Physical Exam Constitutional:      General: She is not in acute distress.    Appearance: She is well-developed.  HENT:     Head: Normocephalic.     Right Ear: External ear normal.     Left Ear: External ear normal.     Nose: Nose normal.  Eyes:     General:        Right eye: No discharge.        Left eye: No discharge.  Conjunctiva/sclera: Conjunctivae normal.     Pupils: Pupils are equal, round, and reactive to light.  Neck:     Thyroid : No thyromegaly.     Vascular: No JVD.     Trachea: No tracheal deviation.  Cardiovascular:     Rate and Rhythm: Normal rate and regular rhythm.     Heart sounds: Normal heart sounds.  Pulmonary:     Effort: No respiratory distress.     Breath sounds: No stridor. No wheezing.  Abdominal:     General: Bowel sounds are normal. There is no distension.     Palpations: Abdomen is soft. There is no mass.     Tenderness: There is no abdominal tenderness. There is no guarding or rebound.  Musculoskeletal:        General: No tenderness.     Cervical back: Normal range of motion and neck supple. No rigidity.  Lymphadenopathy:     Cervical: No cervical adenopathy.  Skin:    Findings: No erythema or rash.   Neurological:     Cranial Nerves: No cranial nerve deficit.     Motor: No abnormal muscle tone.     Coordination: Coordination normal.     Deep Tendon Reflexes: Reflexes normal.  Psychiatric:        Behavior: Behavior normal.        Thought Content: Thought content normal.        Judgment: Judgment normal.     Lab Results  Component Value Date   WBC 4.9 02/10/2024   HGB 13.3 02/10/2024   HCT 39.8 02/10/2024   PLT 346.0 02/10/2024   GLUCOSE 87 02/10/2024   CHOL 217 (H) 02/10/2024   TRIG 100.0 02/10/2024   HDL 63.10 02/10/2024   LDLCALC 134 (H) 02/10/2024   ALT 16 02/10/2024   AST 16 02/10/2024   NA 141 02/10/2024   K 4.0 02/10/2024   CL 104 02/10/2024   CREATININE 0.70 02/10/2024   BUN 12 02/10/2024   CO2 29 02/10/2024   TSH 1.43 02/10/2024   INR 1.16 06/29/2015   HGBA1C 5.4 08/23/2018    MM 3D DIAGNOSTIC MAMMOGRAM BILATERAL BREAST Result Date: 10/08/2023 CLINICAL DATA:  58 year old female here for delayed follow-up after probably benign biopsy of the right breast which was benign fibrocystic changes. EXAM: DIGITAL DIAGNOSTIC BILATERAL MAMMOGRAM WITH TOMOSYNTHESIS AND CAD TECHNIQUE: Bilateral digital diagnostic mammography and breast tomosynthesis was performed. The images were evaluated with computer-aided detection. COMPARISON:  Previous exam(s). ACR Breast Density Category c: The breasts are heterogeneously dense, which may obscure small masses. FINDINGS: Stable biopsy clip in the right medial upper breast middle depth. No suspicious microcalcifications, masses or distortion seen in either breast. IMPRESSION: No mammographic evidence of malignancy in either breast. RECOMMENDATION: Routine annual screening mammogram in one year. I have discussed the findings and recommendations with the patient. If applicable, a reminder letter will be sent to the patient regarding the next appointment. BI-RADS CATEGORY  2: Benign. Electronically Signed   By: Rosina Gelineau M.D.   On:  10/08/2023 09:42    Assessment & Plan:   Problem List Items Addressed This Visit     Asthmatic bronchitis   Exclusion diet discussed Food allergy IgE and IgG tests were discussed (2013)      Relevant Orders   Vitamin B12 (Completed)   B12 deficiency   On B12 SL now      Low back pain   PT is pending      Osteoarthritis   PT is pending  Scaly patch rash - Primary   Not better Surgical ref to r/o pilonidal cyst (unlikely, but possible)      Vitamin D  deficiency   On vit D      Relevant Orders   VITAMIN D  25 Hydroxy (Vit-D Deficiency, Fractures) (Completed)   Other Visit Diagnoses       Well adult exam       Relevant Orders   TSH (Completed)   Urinalysis (Completed)   CBC with Differential/Platelet (Completed)   Lipid panel (Completed)   Comprehensive metabolic panel with GFR (Completed)   Vitamin B12 (Completed)   VITAMIN D  25 Hydroxy (Vit-D Deficiency, Fractures) (Completed)         No orders of the defined types were placed in this encounter.     Follow-up: Return in about 6 months (around 08/09/2024) for a follow-up visit.  Marolyn Noel, MD

## 2024-02-14 ENCOUNTER — Ambulatory Visit: Payer: Self-pay | Admitting: Internal Medicine

## 2024-02-17 ENCOUNTER — Telehealth: Payer: Self-pay | Admitting: Podiatry

## 2024-02-17 NOTE — Telephone Encounter (Signed)
 DOS- 03/16/2024  3RD METATARSAL OSTEOTOMY RT- 71691 EXC BENIGN LESION 2.0CM RT- 11422  [employer]ET[employer] EFFECTIVE DATE- 03/31/2023  DEDUCTIBLE- $1500 REMAINING- $0 OOP- $5900 REMAINING- $2790.31 FAMILY DEDUCTIBLE- $4500 REMAINING- $3000 FAMILY OOP- $16300 REMAINING- $87386.07 COINSURANCE- 30%  PER AVAILITY PORTAL, NO PRIOR AUTHS ARE REQUIRED FOR CPT CODES 71691 AND 11422. DOCUMENTATION ATTACHED TO SURGERY CONSENT PACKET.

## 2024-02-26 ENCOUNTER — Other Ambulatory Visit: Payer: Self-pay | Admitting: Internal Medicine

## 2024-03-14 ENCOUNTER — Telehealth: Payer: Self-pay | Admitting: Podiatry

## 2024-03-14 NOTE — Telephone Encounter (Signed)
 Patient called needing to reschedule surgery due to work. She requested a visit with provider so I have scheduled that and she will contact office once she wants to reschedule.

## 2024-03-15 ENCOUNTER — Encounter: Payer: Self-pay | Admitting: Podiatry

## 2024-03-15 ENCOUNTER — Ambulatory Visit: Admitting: Podiatry

## 2024-03-15 VITALS — Ht 65.0 in | Wt 176.0 lb

## 2024-03-15 DIAGNOSIS — D2371 Other benign neoplasm of skin of right lower limb, including hip: Secondary | ICD-10-CM | POA: Diagnosis not present

## 2024-03-22 ENCOUNTER — Encounter: Admitting: Podiatry

## 2024-03-28 NOTE — Progress Notes (Signed)
 "  Chief Complaint  Patient presents with   Callouses    Pt is here to have callous removed from the right foot.    Subjective: 58 y.o. 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   Fibroid    Fibromyalgia    Fracture of cervical vertebra, C6 (HCC) 06/29/2015   Gastritis    GERD    Heart murmur    History of endometriosis    IBS (irritable bowel syndrome)    Internal hemorrhoids    MVP (mitral valve prolapse)    Osteoarthritis    Rosacea    Skin cancer of arm, right    Thoracic compression fracture (HCC) 06/29/2015   T2-T6   VAIN I (vaginal intraepithelial neoplasia grade I)     Past Surgical History:  Procedure Laterality Date   ABDOMINAL HYSTERECTOMY     partial   ABDOMINAL HYSTERECTOMY     ANAL FISSURE REPAIR     Dr Lorriane 2009   APPENDECTOMY     COLPOSCOPY     HEMORRHOID SURGERY     Dr. Lorriane 2009   OVARIAN CYST REMOVAL     RT BUNIONECTOMT     TONSILLECTOMY AND ADENOIDECTOMY     TUBAL LIGATION     UTERINE FIBROID SURGERY      Allergies[1]   Objective:  Physical Exam General: Alert and oriented x3 in no acute distress  Dermatology: Hyperkeratotic lesion(s) present on the plantar aspect of the right foot. Pain on palpation with a central nucleated core noted. Skin is warm, dry and supple bilateral lower extremities. Negative for open lesions or macerations.  Vascular: Palpable pedal pulses bilaterally. No edema or erythema noted. Capillary refill within normal limits.  Neurological: Grossly intact via light touch  Musculoskeletal Exam: Pain on palpation at the keratotic lesion(s) noted. Range of motion within normal limits bilateral.  Muscle strength 5/5 in all groups bilateral.  Assessment: 1.  Symptomatic benign skin lesion   Plan of Care:  -Patient evaluated -Excisional debridement of keratoic lesion(s) using a chisel blade was performed without incident.  -Salicylic acid applied with a bandaid -Return to the clinic PRN.   Thresa EMERSON Sar, DPM [facility]  Dr. Thresa EMERSON Sar, DPM    [address], [place] [address]                Office [phone]  Fax [phone]        [1]  Allergies Allergen Reactions   Indocin [Indomethacin] Swelling, Rash and Other (See Comments)    REACTION: lupus like illness; butterfly rash, pain and swelling all over body.    Morphine Sulfate Nausea And Vomiting   Sulfacetamide Sodium Swelling   Caffeine Other (See Comments)    Induces A-fib   Morphine And Codeine      nausea   Sulfa Antibiotics Swelling   Erythromycin Stearate Hives and Swelling  CAN TAKE Z-PAK   "

## 2024-03-29 ENCOUNTER — Encounter: Admitting: Podiatry

## 2024-04-12 ENCOUNTER — Encounter: Admitting: Podiatry

## 2024-09-20 ENCOUNTER — Ambulatory Visit: Admitting: Obstetrics and Gynecology
